# Patient Record
Sex: Female | Born: 1935
Health system: Southern US, Community
[De-identification: ages and names within clinical notes are randomized; demographics above are authoritative.]

## PROBLEM LIST (undated history)

## (undated) DIAGNOSIS — F32A Depression, unspecified: Secondary | ICD-10-CM

## (undated) DIAGNOSIS — E039 Hypothyroidism, unspecified: Secondary | ICD-10-CM

## (undated) DIAGNOSIS — G894 Chronic pain syndrome: Secondary | ICD-10-CM

## (undated) DIAGNOSIS — M1990 Unspecified osteoarthritis, unspecified site: Secondary | ICD-10-CM

## (undated) DIAGNOSIS — I5032 Chronic diastolic (congestive) heart failure: Secondary | ICD-10-CM

## (undated) DIAGNOSIS — C189 Malignant neoplasm of colon, unspecified: Secondary | ICD-10-CM

## (undated) DIAGNOSIS — R600 Localized edema: Secondary | ICD-10-CM

## (undated) DIAGNOSIS — I10 Essential (primary) hypertension: Secondary | ICD-10-CM

## (undated) DIAGNOSIS — R911 Solitary pulmonary nodule: Secondary | ICD-10-CM

## (undated) DIAGNOSIS — R0609 Other forms of dyspnea: Secondary | ICD-10-CM

## (undated) DIAGNOSIS — G4733 Obstructive sleep apnea (adult) (pediatric): Secondary | ICD-10-CM

## (undated) DIAGNOSIS — K589 Irritable bowel syndrome without diarrhea: Secondary | ICD-10-CM

## (undated) DIAGNOSIS — Z531 Procedure and treatment not carried out because of patient's decision for reasons of belief and group pressure: Secondary | ICD-10-CM

## (undated) DIAGNOSIS — I2510 Atherosclerotic heart disease of native coronary artery without angina pectoris: Secondary | ICD-10-CM

## (undated) DIAGNOSIS — F419 Anxiety disorder, unspecified: Secondary | ICD-10-CM

## (undated) DIAGNOSIS — R296 Repeated falls: Secondary | ICD-10-CM

## (undated) DIAGNOSIS — N19 Unspecified kidney failure: Secondary | ICD-10-CM

## (undated) DIAGNOSIS — I482 Chronic atrial fibrillation, unspecified: Secondary | ICD-10-CM

## (undated) DIAGNOSIS — Q820 Hereditary lymphedema: Secondary | ICD-10-CM

## (undated) DIAGNOSIS — I2781 Cor pulmonale (chronic): Secondary | ICD-10-CM

## (undated) DIAGNOSIS — F329 Major depressive disorder, single episode, unspecified: Secondary | ICD-10-CM

## (undated) DIAGNOSIS — N189 Chronic kidney disease, unspecified: Secondary | ICD-10-CM

## (undated) DIAGNOSIS — R29898 Other symptoms and signs involving the musculoskeletal system: Principal | ICD-10-CM

## (undated) DIAGNOSIS — R269 Unspecified abnormalities of gait and mobility: Principal | ICD-10-CM

## (undated) DIAGNOSIS — I272 Pulmonary hypertension, unspecified: Secondary | ICD-10-CM

## (undated) DIAGNOSIS — E785 Hyperlipidemia, unspecified: Secondary | ICD-10-CM

## (undated) DIAGNOSIS — A159 Respiratory tuberculosis unspecified: Secondary | ICD-10-CM

## (undated) DIAGNOSIS — J45909 Unspecified asthma, uncomplicated: Secondary | ICD-10-CM

## (undated) DIAGNOSIS — IMO0001 Reserved for inherently not codable concepts without codable children: Secondary | ICD-10-CM

## (undated) DIAGNOSIS — K5730 Diverticulosis of large intestine without perforation or abscess without bleeding: Secondary | ICD-10-CM

## (undated) DIAGNOSIS — D631 Anemia in chronic kidney disease: Secondary | ICD-10-CM

## (undated) DIAGNOSIS — R0600 Dyspnea, unspecified: Secondary | ICD-10-CM

## (undated) DIAGNOSIS — K56609 Unspecified intestinal obstruction, unspecified as to partial versus complete obstruction: Secondary | ICD-10-CM

## (undated) DIAGNOSIS — J449 Chronic obstructive pulmonary disease, unspecified: Secondary | ICD-10-CM

## (undated) DIAGNOSIS — G4700 Insomnia, unspecified: Secondary | ICD-10-CM

## (undated) HISTORY — PX: COLON BIOPSY: SHX1369

## (undated) HISTORY — DX: Unspecified asthma, uncomplicated: J45.909

## (undated) HISTORY — DX: Chronic pain syndrome: G89.4

## (undated) HISTORY — DX: Insomnia, unspecified: G47.00

## (undated) HISTORY — DX: Hypothyroidism, unspecified: E03.9

## (undated) HISTORY — DX: Atherosclerotic heart disease of native coronary artery without angina pectoris: I25.10

## (undated) HISTORY — DX: Other symptoms and signs involving the musculoskeletal system: R29.898

## (undated) HISTORY — DX: Repeated falls: R29.6

## (undated) HISTORY — DX: Chronic kidney disease, unspecified: N18.9

## (undated) HISTORY — DX: Localized edema: R60.0

## (undated) HISTORY — DX: Pulmonary hypertension, unspecified: I27.20

## (undated) HISTORY — DX: Diverticulosis of large intestine without perforation or abscess without bleeding: K57.30

## (undated) HISTORY — DX: Unspecified intestinal obstruction, unspecified as to partial versus complete obstruction: K56.609

## (undated) HISTORY — DX: Hyperlipidemia, unspecified: E78.5

## (undated) HISTORY — DX: Chronic diastolic (congestive) heart failure: I50.32

## (undated) HISTORY — DX: Solitary pulmonary nodule: R91.1

## (undated) HISTORY — DX: Cor pulmonale (chronic): I27.81

## (undated) HISTORY — DX: Chronic atrial fibrillation, unspecified: I48.20

## (undated) HISTORY — DX: Irritable bowel syndrome, unspecified: K58.9

## (undated) HISTORY — DX: Obstructive sleep apnea (adult) (pediatric): G47.33

## (undated) HISTORY — DX: Depression, unspecified: F32.A

## (undated) HISTORY — PX: COLON RESECTION: SHX5231

## (undated) HISTORY — DX: Major depressive disorder, single episode, unspecified: F32.9

## (undated) HISTORY — DX: Chronic obstructive pulmonary disease, unspecified: J44.9

## (undated) HISTORY — DX: Malignant neoplasm of colon, unspecified: C18.9

## (undated) HISTORY — DX: Dyspnea, unspecified: R06.00

## (undated) HISTORY — DX: Respiratory tuberculosis unspecified: A15.9

## (undated) HISTORY — DX: Other forms of dyspnea: R06.09

## (undated) HISTORY — DX: Anemia in chronic kidney disease: D63.1

## (undated) HISTORY — DX: Hereditary lymphedema: Q82.0

## (undated) HISTORY — DX: Unspecified osteoarthritis, unspecified site: M19.90

## (undated) HISTORY — DX: Unspecified abnormalities of gait and mobility: R26.9

---
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
## 1981-02-16 HISTORY — PX: ABDOMINAL HYSTERECTOMY: SHX81

## 1982-02-16 HISTORY — PX: CHOLECYSTECTOMY: SHX55

## 1986-07-20 ENCOUNTER — Encounter: Payer: Self-pay | Admitting: Internal Medicine

## 1988-10-14 ENCOUNTER — Encounter (INDEPENDENT_AMBULATORY_CARE_PROVIDER_SITE_OTHER): Payer: Self-pay | Admitting: *Deleted

## 1990-10-11 ENCOUNTER — Encounter: Payer: Self-pay | Admitting: Internal Medicine

## 1993-08-07 ENCOUNTER — Encounter: Payer: Self-pay | Admitting: Internal Medicine

## 1994-04-02 ENCOUNTER — Encounter: Payer: Self-pay | Admitting: Internal Medicine

## 1997-07-02 ENCOUNTER — Emergency Department (HOSPITAL_COMMUNITY): Admission: EM | Admit: 1997-07-02 | Discharge: 1997-07-02 | Payer: Self-pay | Admitting: Emergency Medicine

## 1997-07-09 ENCOUNTER — Inpatient Hospital Stay (HOSPITAL_COMMUNITY): Admission: AD | Admit: 1997-07-09 | Discharge: 1997-07-13 | Payer: Self-pay | Admitting: Pediatrics

## 1997-07-31 ENCOUNTER — Inpatient Hospital Stay (HOSPITAL_COMMUNITY): Admission: EM | Admit: 1997-07-31 | Discharge: 1997-08-03 | Payer: Self-pay | Admitting: Emergency Medicine

## 1997-08-08 ENCOUNTER — Ambulatory Visit (HOSPITAL_COMMUNITY): Admission: RE | Admit: 1997-08-08 | Discharge: 1997-08-08 | Payer: Self-pay | Admitting: Cardiology

## 1997-08-16 ENCOUNTER — Encounter (HOSPITAL_COMMUNITY): Admission: RE | Admit: 1997-08-16 | Discharge: 1997-11-14 | Payer: Self-pay | Admitting: Cardiology

## 1997-09-11 ENCOUNTER — Encounter (HOSPITAL_COMMUNITY): Admission: RE | Admit: 1997-09-11 | Discharge: 1997-12-10 | Payer: Self-pay | Admitting: Cardiology

## 1998-05-12 ENCOUNTER — Emergency Department (HOSPITAL_COMMUNITY): Admission: EM | Admit: 1998-05-12 | Discharge: 1998-05-12 | Payer: Self-pay | Admitting: Emergency Medicine

## 1998-05-12 ENCOUNTER — Encounter: Payer: Self-pay | Admitting: Emergency Medicine

## 1998-07-19 ENCOUNTER — Ambulatory Visit (HOSPITAL_COMMUNITY): Admission: RE | Admit: 1998-07-19 | Discharge: 1998-07-19 | Payer: Self-pay | Admitting: Cardiology

## 2000-02-05 ENCOUNTER — Encounter: Payer: Self-pay | Admitting: Internal Medicine

## 2000-02-17 DIAGNOSIS — E039 Hypothyroidism, unspecified: Secondary | ICD-10-CM

## 2000-02-17 HISTORY — DX: Hypothyroidism, unspecified: E03.9

## 2001-06-16 ENCOUNTER — Other Ambulatory Visit: Admission: RE | Admit: 2001-06-16 | Discharge: 2001-06-16 | Payer: Self-pay | Admitting: Family Medicine

## 2001-07-15 ENCOUNTER — Ambulatory Visit (HOSPITAL_COMMUNITY): Admission: RE | Admit: 2001-07-15 | Discharge: 2001-07-15 | Payer: Self-pay | Admitting: Cardiology

## 2003-02-06 ENCOUNTER — Encounter: Admission: RE | Admit: 2003-02-06 | Discharge: 2003-02-06 | Payer: Self-pay | Admitting: Family Medicine

## 2003-02-12 ENCOUNTER — Inpatient Hospital Stay (HOSPITAL_COMMUNITY): Admission: AD | Admit: 2003-02-12 | Discharge: 2003-02-17 | Payer: Self-pay | Admitting: Cardiology

## 2003-02-12 HISTORY — PX: CARDIAC CATHETERIZATION: SHX172

## 2003-02-14 ENCOUNTER — Encounter (INDEPENDENT_AMBULATORY_CARE_PROVIDER_SITE_OTHER): Payer: Self-pay | Admitting: Specialist

## 2003-03-14 ENCOUNTER — Ambulatory Visit (HOSPITAL_COMMUNITY): Admission: RE | Admit: 2003-03-14 | Discharge: 2003-03-14 | Payer: Self-pay | Admitting: Pulmonary Disease

## 2003-04-16 ENCOUNTER — Ambulatory Visit (HOSPITAL_COMMUNITY): Admission: RE | Admit: 2003-04-16 | Discharge: 2003-04-16 | Payer: Self-pay | Admitting: Pulmonary Disease

## 2003-05-29 ENCOUNTER — Ambulatory Visit (HOSPITAL_COMMUNITY): Admission: RE | Admit: 2003-05-29 | Discharge: 2003-05-29 | Payer: Self-pay | Admitting: Pulmonary Disease

## 2003-06-11 ENCOUNTER — Other Ambulatory Visit: Admission: RE | Admit: 2003-06-11 | Discharge: 2003-06-11 | Payer: Self-pay | Admitting: Family Medicine

## 2004-01-21 ENCOUNTER — Ambulatory Visit: Payer: Self-pay | Admitting: Internal Medicine

## 2004-01-24 ENCOUNTER — Encounter: Admission: RE | Admit: 2004-01-24 | Discharge: 2004-01-24 | Payer: Self-pay | Admitting: Internal Medicine

## 2004-08-14 ENCOUNTER — Other Ambulatory Visit: Admission: RE | Admit: 2004-08-14 | Discharge: 2004-08-14 | Payer: Self-pay | Admitting: Family Medicine

## 2004-08-22 ENCOUNTER — Encounter: Admission: RE | Admit: 2004-08-22 | Discharge: 2004-08-22 | Payer: Self-pay | Admitting: Family Medicine

## 2004-11-21 ENCOUNTER — Encounter: Admission: RE | Admit: 2004-11-21 | Discharge: 2004-11-21 | Payer: Self-pay | Admitting: Family Medicine

## 2004-11-25 ENCOUNTER — Encounter: Admission: RE | Admit: 2004-11-25 | Discharge: 2004-11-25 | Payer: Self-pay | Admitting: Family Medicine

## 2004-12-04 ENCOUNTER — Ambulatory Visit: Payer: Self-pay | Admitting: Internal Medicine

## 2006-01-04 ENCOUNTER — Encounter: Admission: RE | Admit: 2006-01-04 | Discharge: 2006-01-22 | Payer: Self-pay | Admitting: Family Medicine

## 2006-01-08 ENCOUNTER — Encounter: Admission: RE | Admit: 2006-01-08 | Discharge: 2006-01-08 | Payer: Self-pay | Admitting: Family Medicine

## 2006-02-02 ENCOUNTER — Encounter: Admission: RE | Admit: 2006-02-02 | Discharge: 2006-02-02 | Payer: Self-pay | Admitting: Family Medicine

## 2006-02-02 ENCOUNTER — Encounter: Payer: Self-pay | Admitting: Internal Medicine

## 2006-07-15 ENCOUNTER — Encounter: Admission: RE | Admit: 2006-07-15 | Discharge: 2006-07-15 | Payer: Self-pay | Admitting: Family Medicine

## 2008-03-12 ENCOUNTER — Encounter (INDEPENDENT_AMBULATORY_CARE_PROVIDER_SITE_OTHER): Payer: Self-pay | Admitting: *Deleted

## 2008-03-12 ENCOUNTER — Ambulatory Visit: Payer: Self-pay | Admitting: Vascular Surgery

## 2008-03-12 ENCOUNTER — Inpatient Hospital Stay (HOSPITAL_COMMUNITY): Admission: EM | Admit: 2008-03-12 | Discharge: 2008-03-13 | Payer: Self-pay | Admitting: Emergency Medicine

## 2008-03-28 ENCOUNTER — Encounter: Admission: RE | Admit: 2008-03-28 | Discharge: 2008-03-28 | Payer: Self-pay | Admitting: Internal Medicine

## 2009-07-16 ENCOUNTER — Telehealth: Payer: Self-pay | Admitting: Internal Medicine

## 2009-07-17 DIAGNOSIS — J984 Other disorders of lung: Secondary | ICD-10-CM

## 2009-07-17 DIAGNOSIS — K5730 Diverticulosis of large intestine without perforation or abscess without bleeding: Secondary | ICD-10-CM | POA: Insufficient documentation

## 2009-07-17 DIAGNOSIS — Q820 Hereditary lymphedema: Secondary | ICD-10-CM | POA: Insufficient documentation

## 2009-07-17 DIAGNOSIS — E785 Hyperlipidemia, unspecified: Secondary | ICD-10-CM

## 2009-07-17 DIAGNOSIS — I10 Essential (primary) hypertension: Secondary | ICD-10-CM | POA: Insufficient documentation

## 2009-07-17 DIAGNOSIS — C187 Malignant neoplasm of sigmoid colon: Secondary | ICD-10-CM

## 2009-07-17 DIAGNOSIS — J449 Chronic obstructive pulmonary disease, unspecified: Secondary | ICD-10-CM | POA: Insufficient documentation

## 2009-07-17 DIAGNOSIS — I2510 Atherosclerotic heart disease of native coronary artery without angina pectoris: Secondary | ICD-10-CM

## 2009-07-17 DIAGNOSIS — K5900 Constipation, unspecified: Secondary | ICD-10-CM

## 2009-07-17 DIAGNOSIS — F32A Depression, unspecified: Secondary | ICD-10-CM | POA: Insufficient documentation

## 2009-07-17 DIAGNOSIS — I252 Old myocardial infarction: Secondary | ICD-10-CM

## 2009-07-17 DIAGNOSIS — Z8719 Personal history of other diseases of the digestive system: Secondary | ICD-10-CM

## 2009-07-17 DIAGNOSIS — F329 Major depressive disorder, single episode, unspecified: Secondary | ICD-10-CM

## 2009-07-18 ENCOUNTER — Ambulatory Visit: Payer: Self-pay | Admitting: Internal Medicine

## 2009-10-23 ENCOUNTER — Encounter (INDEPENDENT_AMBULATORY_CARE_PROVIDER_SITE_OTHER): Payer: Self-pay | Admitting: *Deleted

## 2009-12-11 ENCOUNTER — Telehealth: Payer: Self-pay | Admitting: Internal Medicine

## 2010-01-17 ENCOUNTER — Encounter: Payer: Self-pay | Admitting: Internal Medicine

## 2010-02-24 ENCOUNTER — Ambulatory Visit
Admission: RE | Admit: 2010-02-24 | Discharge: 2010-02-24 | Payer: Self-pay | Source: Home / Self Care | Attending: Internal Medicine | Admitting: Internal Medicine

## 2010-02-24 ENCOUNTER — Encounter: Payer: Self-pay | Admitting: Internal Medicine

## 2010-03-09 ENCOUNTER — Encounter: Payer: Self-pay | Admitting: Pulmonary Disease

## 2010-03-18 NOTE — Progress Notes (Signed)
Summary: Schedule Direct Colon  ---- Converted from flag ----  ---- 12/11/2009 11:00 AM, Karen Kitchens Nelson-Smith CMA (AAMA) wrote: This patient is on for 12/17/09 for office visit to discuss having a colonoscopy due to her coumadin usage. However, looks like you have already said patient should d/c coumadin prior to colonoscopy in 2011.Marland KitchenMarland KitchenMarland KitchenDo you want me to just schedule her for direct colon off coumadin????   ---- 12/11/2009 1:00 PM, Hart Carwin MD wrote: OK for direct colon OFF Coumadin thanx ------------------------------

## 2010-03-18 NOTE — Procedures (Signed)
Summary: [facility]   Imported By: Sherian Rein 07/17/2009 15:17:04  _____________________________________________________________________  External Attachment:    Type:   Image     Comment:   External Document

## 2010-03-18 NOTE — Assessment & Plan Note (Signed)
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  She was due for colonoscopy this December 2006.  She is doing quite well with her bowel habits and has not been taking any laxatives.  She is currently on Coumadin 2 mg a day for chronic atrial fibrillation.  She denies any rectal bleeding.  MEDICATIONS:  Lipitor 40 mg p.o. q.d., Toprol-XL 150 mg p.o. q.d., Coumadin 2 mg p.o. q.d., Synthroid 1 mg q.d., promethazine, morphine sulfate t.i.d., and GlycoLax and Zelnorm on p.r.n. basis.  PHYSICAL EXAMINATION:  Blood pressure 128/72; pulse 70; weight 234 pounds.  She was overweight, alert, oriented, in no distress.  Lungs were clear to auscultation.  COR with normal S1; normal S2.  Abdomen was soft, obese, mildly tender diffusely in mostly the left lower quadrant and left middle quadrant.  A well-healed surgical scar was seen above and below the umbilicus.  Rectal exam shows large amount of soft, Hemoccult-negative stool.  Extremities:  Edema bilaterally, trace to 1+.   IMPRESSION:  A 76 year old white female with history of invasive adenocarcinoma of the sigmoid colon in a polyp which was entirely removed with subsequent segmental resection of the sigmoid colon in 1984.  She since then has had numerous colonoscopies, last one in December 2001 and has done quite well in not having any polyps.  I think it would be quite reasonable for her to wait another year or two for another colonoscopy.  There is no evidence of upper gastrointestinal blood loss.  Her chances of developing colon cancer this time are quite low, and the risk of taking  her off Coumadin is higher in view of her chronic atrial fibrillation.  PLAN: 1.  Re-call colonoscopy in 1 year.  We will discuss then a repeat colonoscopy. 2.  Continue on MiraLax on p.r.n. basis. 3.  Follow up with Dr. Smith Mince.    Hedwig Morton. Juanda Chance, M.D.  [id number] cc:  Talmadge Coventry, MD D:  12/04/04; T:  ; Job [id number]

## 2010-03-18 NOTE — Procedures (Signed)
Summary: Colonoscopy/[place]  Colonoscopy/[place]   Imported By: Sherian Rein 07/17/2009 15:19:31  _____________________________________________________________________  External Attachment:    Type:   Image     Comment:   External Document

## 2010-03-18 NOTE — Letter (Signed)
Summary: Colonoscopy Letter  [facility]  [address] [place], [place] [address]   Phone: [phone]  Fax: [phone]      October 23, 2009 MRN: [id number]   GABRYELLA MURFIN [address] [place], [place]  [address]   Dear Ms. Wogan,   According to your medical record, it is time for you to schedule a Colonoscopy. The American Cancer Society recommends this procedure as a method to detect early colon cancer. Patients with a family history of colon cancer, or a personal history of colon polyps or inflammatory bowel disease are at increased risk.  This letter has beeen generated based on the recommendations made at the time of your procedure. If you feel that in your particular situation this may no longer apply, please contact our office.  Please call our office at [phone] to schedule this appointment or to update your records at your earliest convenience.  Thank you for cooperating with [place] to provide you with the very best care possible.   Sincerely,  Hedwig Morton. Juanda Chance, M.D.  [facility]astroenterology [facility] [phone]

## 2010-03-18 NOTE — Progress Notes (Signed)
Summary: Triage  Phone Note Call from Patient Call back at Home Phone [phone]   Caller: Patient Call For: Dhriti Fales Reason for Call: Talk to Nurse Summary of Call: Patient is scheduled for first available appt 8-8 but wants to know if it's ok with Dr. Juanda Chance for her to wait that long. Patient states that she is having a lot of problems having a bm despite laxatives and has rectal pain, she's very concern because she had colon cancer in the past. Initial call taken by: Tawni Levy,  Jul 16, 2009 4:30 PM  Follow-up for Phone Call        Pt. will see Dr.Quint Chestnut on 07-18-09 at 9:45am. Follow-up by: Laureen Ochs LPN,  July 18, 4694 9:21 AM

## 2010-03-18 NOTE — Assessment & Plan Note (Signed)
Summary: trouble with bms, rectal pain...em   History of Present Illness Visit Type: Initial Visit Primary GI MD: Lina Sar MD Primary Provider: Ace Gins MD Chief Complaint: rectal pain, constipation History of Present Illness:   Ms. 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 She had a small-bowel obstruction in 1985 and laparoscopic surgery for small-bowel obstruction in 2001. She was due for a colonoscopy in December 2006 but never scheduled one. Patient comes today complaining of constipation as well as rectal pain and bleeding. She also complains of lower abdominal pain and bloating. She has l started  on MiraLax 17 g daily with good results. She has not been very active physically due  to swelling of her feet. and blames her weight gain on that. She has been on Coumadin for chronic atrial fibtillation.   GI Review of Systems    Reports abdominal pain and  bloating.     Location of  Abdominal pain: lower abdomen.    Denies acid reflux, belching, chest pain, dysphagia with liquids, dysphagia with solids, heartburn, loss of appetite, nausea, vomiting, vomiting blood, weight loss, and  weight gain.      Reports constipation, rectal bleeding, and  rectal pain.     Denies anal fissure, black tarry stools, change in bowel habit, diarrhea, diverticulosis, fecal incontinence, heme positive stool, hemorrhoids, irritable bowel syndrome, jaundice, light color stool, and  liver problems.    Current Medications (verified): 1)  Torsemide 20 Mg Tabs (Torsemide) .... As Needed 2)  Klor-Con 10 10 Meq Cr-Tabs (Potassium Chloride) .... As Needed 3)  Toprol Xl 50 Mg Xr24h-Tab (Metoprolol Succinate) .[name].. 1 By Mouth Once Daily 4)  Coumadin 2 Mg Tabs (Warfarin Sodium) .... Varies (As Directed) 5)  Aspir-Low 81 Mg Tbec (Aspirin) .[name].. 1 By Mouth Once Daily 6)   Lipitor 40 Mg Tabs (Atorvastatin Calcium) .[name].. 1 Poqd 7)  Nitrolingual 0.4 Mg/spray Soln (Nitroglycerin) .... As Needed 8)  Synthroid 112 Mcg Tabs (Levothyroxine Sodium) .[name].. 1 By Mouth Once Daily 9)  Sertraline Hcl 50 Mg Tabs (Sertraline Hcl) .[name].. 1 By Mouth Once Daily 10)  Promethazine Hcl 25 Mg Tabs (Promethazine Hcl) .[name].. 1 By Mouth Once Daily 11)  Morphine Sulfate Cr 60 Mg Xr12h-Tab (Morphine Sulfate) .[name].. 1 By Mouth Three Times A Day 12)  Lisinopril 20 Mg Tabs (Lisinopril) .[name].. 1 By Mouth Once Daily 13)  Ambien Cr 6.25 Mg Cr-Tabs (Zolpidem Tartrate) .[name].. 1 By Mouth At Bedtime 14)  Xopenex 0.63 Mg/44ml Nebu (Levalbuterol Hcl) .... As Needed 15)  Nasonex 50 Mcg/act Susp (Mometasone Furoate) .... As Needed 16)  Brovana 15 Mcg/61ml Nebu (Arformoterol Tartrate) .... As Needed  Allergies (verified): 1)  ! Pcn 2)  ! Codeine 3)  ! Sulfa 4)  ! * Latex  Past History:  Past Medical History: Reviewed history from 07/17/2009 and no changes required. Current Problems:  CONSTIPATION (ICD-564.00) COPD (ICD-496) HYPERTENSION (ICD-401.9) HYPERLIPIDEMIA (ICD-272.4) ATRIAL FIBRILLATION, CHRONIC (ICD-427.31) DIVERTICULOSIS, COLON (ICD-562.10) GASTRITIS, HX OF (ICD-V12.79) DEPRESSION (ICD-311) CORONARY ARTERY DISEASE (ICD-414.00) LUNG NODULE (ICD-518.89) MILROY'S DISEASE (ICD-757.0) MYOCARDIAL INFARCTION, HX OF (ICD-412) SMALL BOWEL OBSTRUCTION, HX OF (ICD-V12.79) Hx of CARCINOMA, SIGMOID COLON (ICD-153.3)    Past Surgical History: Reviewed history from 07/17/2009 and no changes required. left hemicolectomy-secondary to sigmoid adenocarinoma in 1984 Anastomotic stircure repair-1984 Cholecystectomy Appendectomy Total abdominal hysterectomy and unilateral (left) oophorectomy Cardiac Angioplasty  Family History: Reviewed history  from 07/17/2009 and no changes required. Family History of Heart Disease: Father, Brothers Family History of Diabetes: Brothers x 2, Uncle Family History of  Prostate Cancer: Maternal Uncles x 3  Social History: Reviewed history from 07/17/2009 and no changes required. Patient has never smoked.  Alcohol Use - yes-rare Illicit Drug Use - no  Review of Systems       The patient complains of arthritis/joint pain, change in vision, heart rhythm changes, nosebleeds, shortness of breath, sleeping problems, swelling of feet/legs, and urine leakage.  The patient denies allergy/sinus, anemia, anxiety-new, back pain, blood in urine, breast changes/lumps, confusion, cough, coughing up blood, depression-new, fainting, fatigue, fever, headaches-new, hearing problems, heart murmur, itching, menstrual pain, muscle pains/cramps, night sweats, pregnancy symptoms, skin rash, sore throat, swollen lymph glands, thirst - excessive , urination - excessive , urination changes/pain, vision changes, and voice change.         Pertinent positive and negative review of systems were noted in the above HPI. All other ROS was otherwise negative.   Vital Signs:  Patient profile:   76 year old female Height:      65 inches Weight:      220 pounds BMI:     36.74 Pulse rate:   72 / minute Pulse rhythm:   regular BP sitting:   112 / 60  (left arm)  Vitals Entered By: Chales Abrahams CMA Duncan Dull) (July 18, 2009 9:58 AM)  Physical Exam  General:  alert, oriented and overweight. Eyes:  PERRLA, no icterus. Mouth:  No deformity or lesions, dentition normal. Neck:  Supple; no masses or thyromegaly. Lungs:  Clear throughout to auscultation. Heart:  irregular S1-S2, no murmur. Abdomen:  obese soft abdomen with well healed surgical scar. Normal active bowel sounds. Mild tenderness in left lower and left middle quadrants. Rectal:  soft Hemoccult negative stool. Small external skin tags. Extremities:  bilateral pedal edema extending to her knees. Skin:  skin and nails no rash. Psych:  Alert and cooperative. Normal mood and affect.   Impression & Recommendations:  Problem # 1:   CONSTIPATION (ICD-564.00) Patient has functional constipation. She is status post remote sigmoid resection following a cancerous sigmoid polyp. Her last colonoscopy was in 2001. Her constipation has progressed due to inactivity. She has responded to MiraLax. She needs to continue on MiraLax 17 g daily and use an additional 9 g at night as needed. She is due for a repeat colonoscopy. Anastomotic stricture would be an unlikely  diagnosis.  Problem # 2:  ATRIAL FIBRILLATION, CHRONIC (ICD-427.31) Patient is on chronic Coumadin. We will discontinue Coumadin prior to her colonoscopy. Patient will check with her cardiologist. We will send her a recall letter in October 2011.  Patient Instructions: 1)  Recall colonoscopy, October 2011. 2)  Analpram 1% two times a day as needed. 3)  Miralax 1 capful daily. She may increase to 1 1/2 capfuls daily. 4)  Copy sent to : Ace Gins, MD, [facility] 5)  (Dr Aram Candela) 6)  The medication list was reviewed and reconciled.  All changed / newly prescribed medications were explained.  A complete medication list was provided to the patient / caregiver. Prescriptions: MIRALAX  POWD (POLYETHYLENE GLYCOL 3350) Dissolve 1 1/2 capfuls in at least 8 ounces water/juice and drink once daily.  #527 grams x 11   Entered by:   Lamona Curl CMA (AAMA)   Authorized by:   Hart Carwin MD   Signed by:   Lamona Curl CMA (  AAMA) on 07/18/2009   Method used:   Electronically to        [facility]  [address] [id number] (retail)       [address]       [place], [place]  [address]       Ph: [phone] or [phone]       Fax: [phone]   RxID:   [id number] ANALPRAM-HC 1-1 % CREA (HYDROCORTISONE ACE-PRAMOXINE) Apply to rectum two times a day as needed  #30 grams x 0   Entered by:   Lamona Curl CMA (AAMA)   Authorized by:   Hart Carwin MD   Signed by:   Lamona Curl CMA (AAMA) on 07/18/2009   Method used:    [facility] to        [facility]  [address] [id number]       [address]       [place], [place]  [address]       Ph: [phone] or [phone]       Fax: [phone]   RxID:   [id number]

## 2010-03-18 NOTE — Procedures (Signed)
Summary: Upper Endoscopy/[place]  Upper Endoscopy/[place]   Imported By: Sherian Rein 07/17/2009 15:18:26  _____________________________________________________________________  External Attachment:    Type:   Image     Comment:   External Document

## 2010-03-18 NOTE — Procedures (Signed)
Summary: Colonoscopy/[facility]  Colonoscopy/[facility]   Imported By: Sherian Rein 07/17/2009 15:16:00  _____________________________________________________________________  External Attachment:    Type:   Image     Comment:   External Document

## 2010-03-18 NOTE — Procedures (Signed)
Summary: Colonoscopy/[place]  Colonoscopy/[place]   Imported By: Sherian Rein 07/17/2009 15:14:07  _____________________________________________________________________  External Attachment:    Type:   Image     Comment:   External Document

## 2010-03-18 NOTE — Procedures (Signed)
Summary: Colonoscopy/[place]  Colonoscopy/[place]   Imported By: Sherian Rein 07/17/2009 15:21:36  _____________________________________________________________________  External Attachment:    Type:   Image     Comment:   External Document

## 2010-03-20 NOTE — Letter (Signed)
Summary: [facility]   Imported By: Sherian Rein 02/11/2010 09:59:46  _____________________________________________________________________  External Attachment:    Type:   Image     Comment:   External Document

## 2010-03-20 NOTE — Letter (Signed)
Summary: [facility] Instructions  [facility]  [address] [place], [place] [address]   Phone: [phone]  Fax: [phone]       Victoria Fisher    1934/04/28    MRN: [id number]        Procedure Day /Date: Friday 04/18/10     Arrival Time: 12:30 pm     Procedure Time: 1:30 pm     Location of Procedure:                    _ x_  [facility] (4th Floor)  PREPARATION FOR COLONOSCOPY WITH MOVIPREP   Starting 5 days prior to your procedure 04/14/10 do not eat nuts, seeds, popcorn, corn, beans, peas,  salads, or any raw vegetables.  Do not take any fiber supplements (e.g. Metamucil, Citrucel, and Benefiber).  THE DAY BEFORE YOUR PROCEDURE         DATE: 04/17/10  DAY: Thursday  1.  Drink clear liquids the entire day-NO SOLID FOOD  2.  Do not drink anything colored red or purple.  Avoid juices with pulp.  No orange juice.  3.  Drink at least 64 oz. (8 glasses) of fluid/clear liquids during the day to prevent dehydration and help the prep work efficiently.  CLEAR LIQUIDS INCLUDE: Water Jello Ice Popsicles Tea (sugar ok, no milk/cream) Powdered fruit flavored drinks Coffee (sugar ok, no milk/cream) Gatorade Juice: apple, white grape, white cranberry  Lemonade Clear bullion, consomm, broth Carbonated beverages (any kind) Strained chicken noodle soup Hard Candy                             4.  In the morning, mix first dose of MoviPrep solution:    Empty 1 Pouch A and 1 Pouch B into the disposable container    Add lukewarm drinking water to the top line of the container. Mix to dissolve    Refrigerate (mixed solution should be used within 24 hrs)  5.  Begin drinking the prep at 5:00 p.m. The MoviPrep container is divided by 4 marks.   Every 15 minutes drink the solution down to the next mark (approximately 8 oz) until the full liter is complete.   6.  Follow completed prep with 16 oz of clear liquid of your choice (Nothing red or purple).   Continue to drink clear liquids until bedtime.  7.  Before going to bed, mix second dose of MoviPrep solution:    Empty 1 Pouch A and 1 Pouch B into the disposable container    Add lukewarm drinking water to the top line of the container. Mix to dissolve    Refrigerate  THE DAY OF YOUR PROCEDURE      DATE: 04/18/10 DAY: Friday  Beginning at 8:30 a.m. (5 hours before procedure):         1. Every 15 minutes, drink the solution down to the next mark (approx 8 oz) until the full liter is complete.  2. Follow completed prep with 16 oz. of clear liquid of your choice.    3. You may drink clear liquids until 9:30 am (4HOURS BEFORE PROCEDURE).   MEDICATION INSTRUCTIONS  Unless otherwise instructed, you should take regular prescription medications with a small sip of water   as early as possible the morning of your procedure.  Stop taking Coumadin 3 days prior to your procedure per Dr Lina Sar. You should hold your coumadin on Tuesday  04/15/10, Wednesday 04/16/10 and Thursday 04/17/10.          OTHER INSTRUCTIONS  You will need a responsible adult at least 74 years of age to accompany you and drive you home.   This person must remain in the waiting room during your procedure.  Wear loose fitting clothing that is easily removed.  Leave jewelry and other valuables at home.  However, you may wish to bring a book to read or  an iPod/MP3 player to listen to music as you wait for your procedure to start.  Remove all body piercing jewelry and leave at home.  Total time from sign-in until discharge is approximately 2-3 hours.  You should go home directly after your procedure and rest.  You can resume normal activities the  day after your procedure.  The day of your procedure you should not:   Drive   Make legal decisions   Operate machinery   Drink alcohol   Return to work  You will receive specific instructions about eating, activities and medications before you  leave.    The above instructions have been reviewed and explained to me by   _______________________    I fully understand and can verbalize these instructions _____________________________ Date _________

## 2010-03-20 NOTE — Assessment & Plan Note (Signed)
Summary: wants to sch'd OV 1st before COL--ch.   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 She is status post sigmoid resection. She is on Coumadin for chronic atrial fibrillation. She has Miilroy's disease causing lymphatic obstructions. She is due for a repeat colonoscopy. There is a history of a small bowel obstruction in 2001. Patient has some anxiety about the Coumadin discontinuation althgough it  was okayed by a cardiologist. She is also concerned about the prep and now about sedation. On her last exam, she required high doses of Versed and fentanyl, so she is likely to need propofol sedation.   GI Review of Systems    Reports abdominal pain.     Location of  Abdominal pain: lower abdomen.    Denies acid reflux, belching, bloating, chest pain, dysphagia with liquids, dysphagia with solids, heartburn, loss of appetite, nausea, vomiting, vomiting blood, weight loss, and  weight gain.      Reports constipation.     Denies anal fissure, black tarry stools, change in bowel habit, diarrhea, diverticulosis, fecal incontinence, heme positive stool, hemorrhoids, irritable bowel syndrome, jaundice, light color stool, liver problems, rectal bleeding, and  rectal pain.    Current Medications (verified): 1)  Torsemide 20 Mg Tabs (Torsemide) .... As Needed 2)  Klor-Con 10 10 Meq Cr-Tabs (Potassium Chloride) .... As Needed 3)  Toprol Xl 50 Mg Xr24h-Tab (Metoprolol Succinate) .[name].. 1 By Mouth Once Daily 4)  Coumadin 2 Mg Tabs (Warfarin Sodium) .... Varies (As Directed) 5)  Aspir-Low 81 Mg Tbec (Aspirin) .[name].. 1 By Mouth Once Daily 6)  Lipitor 40 Mg Tabs (Atorvastatin Calcium) .[name].. 1 Poqd 7)  Nitrolingual 0.4 Mg/spray Soln (Nitroglycerin) .... As  Needed 8)  Synthroid 112 Mcg Tabs (Levothyroxine Sodium) .[name].. 1 By Mouth Once Daily 9)  Sertraline Hcl 50 Mg Tabs (Sertraline Hcl) .[name].. 1 By Mouth Once Daily 10)  Promethazine Hcl 25 Mg Tabs (Promethazine Hcl) .[name].. 1 By Mouth Once Daily 11)  Morphine Sulfate Cr 60 Mg Xr12h-Tab (Morphine Sulfate) .[name].. 1 By Mouth Three Times A Day 12)  Lisinopril 20 Mg Tabs (Lisinopril) .[name].. 1 By Mouth Once Daily 13)  Ambien Cr 6.25 Mg Cr-Tabs (Zolpidem Tartrate) .[name].. 1 By Mouth At Bedtime 14)  Xopenex 0.63 Mg/82ml Nebu (Levalbuterol Hcl) .... As Needed 15)  Nasonex 50 Mcg/act Susp (Mometasone Furoate) .... As Needed 16)  Brovana 15 Mcg/61ml Nebu (Arformoterol Tartrate) .... As Needed 17)  Analpram-Hc 1-1 % Crea (Hydrocortisone Ace-Pramoxine) .... Apply To Rectum Two Times A Day As Needed 18)  Miralax  Powd (Polyethylene Glycol 3350) .... Dissolve 1 1/2 Capfuls in At Least 8 Ounces Water/juice and Drink Once Daily. 19)  Morphine Sulfate 30 Mg Tabs (Morphine Sulfate) .... Take 1 Tablet Three Times A Day For Breakthru Pain 20)  Triamcinolone Acetonide 0.025 % Crea (Triamcinolone Acetonide) .... As Needed  Allergies: 1)  ! Pcn 2)  ! Codeine 3)  ! Sulfa 4)  ! * Antibiotics 5)  ! * Latex  Past History:  Past Medical History: Reviewed history from 07/17/2009 and no changes required. Current Problems:  CONSTIPATION (ICD-564.00) COPD (ICD-496) HYPERTENSION (ICD-401.9) HYPERLIPIDEMIA (ICD-272.4) ATRIAL FIBRILLATION, CHRONIC (ICD-427.31) DIVERTICULOSIS, COLON (ICD-562.10) GASTRITIS, HX OF (ICD-V12.79) DEPRESSION (ICD-311) CORONARY ARTERY DISEASE (ICD-414.00) LUNG NODULE (ICD-518.89) MILROY'S DISEASE (ICD-757.0)  MYOCARDIAL INFARCTION, HX OF (ICD-412) SMALL BOWEL OBSTRUCTION, HX OF (ICD-V12.79) Hx of CARCINOMA, SIGMOID COLON (ICD-153.3)    Past Surgical History: Reviewed history from 07/17/2009 and no changes required. left hemicolectomy-secondary to sigmoid adenocarinoma in 1984 Anastomotic stircure  repair-1984 Cholecystectomy Appendectomy Total abdominal hysterectomy and unilateral (left) oophorectomy Cardiac Angioplasty  Family History: Reviewed history from 07/17/2009 and no changes required. Family History of Heart Disease: Father, Brothers Family History of Diabetes: Brothers x 2, Uncle Family History of Prostate Cancer: Maternal Uncles x 3  Social History: Reviewed history from 07/17/2009 and no changes required. Patient has never smoked.  Alcohol Use - yes-rare Illicit Drug Use - no  Vital Signs:  Patient profile:   76 year old female Height:      65 inches Weight:      233.25 pounds BMI:     38.96 Pulse rate:   64 / minute Pulse rhythm:   irregular BP sitting:   120 / 64  (left arm) Cuff size:   large  Vitals Entered By: June McMurray CMA Duncan Dull) (February 24, 2010 10:34 AM)   Impression & Recommendations:  Problem # 1:  Hx of CARCINOMA, SIGMOID COLON (ICD-153.3)  Patient is due for a repeat colonoscopy. We rescheduled the procedure to be completed with propofol based on our last experience with conscious sedation. She is to discontinue her Coumadin 3 days prior to the procedure and will have her prothrombin time rechecked the morning of the procedure. Her prep will be Moviprep since it can be separated into 2 parts to minimize the volume she would have to drink at one time.  Orders: Colonoscopy (Colon)  Problem # 2:  ATRIAL FIBRILLATION, CHRONIC (ICD-427.31) Patient is on Coumadin. This would be discontinued 3 days prior to her colonoscopy.  Patient Instructions: 1)  You have been scheduled for a colonoscopy with Propofol. Please follow written prep instructions that were given to you today at your visit.  2)  Please pick up your prescriptions at the pharmacy. Electronic prescription(s) has already been sent for Moviprep. 3)  We will need to check your prothrombin time on the morning of the procedure. You should arrive around 9:30 am for this test. 4)   Discontinue Coumadin for 3 days prior to the procedure. 5)  Copy sent to : Dr Candise Bowens. Stone 6)  The medication list was reviewed and reconciled.  All changed / newly prescribed medications were explained.  A complete medication list was provided to the patient / caregiver. Prescriptions: MOVIPREP 100 GM  SOLR (PEG-KCL-NACL-NASULF-NA ASC-C) As per prep instructions.  #1 x 0   Entered by:   Lamona Curl CMA (AAMA)   Authorized by:   Hart Carwin MD   Signed by:   Lamona Curl CMA (AAMA) on 02/24/2010   Method used:   Electronically to        [facility]  [address] [id number] ([facility]       [address]       [place], [place]  [address]       Ph: [phone] or [phone]       Fax: [phone]   RxID:   [id number]

## 2010-04-16 ENCOUNTER — Telehealth: Payer: Self-pay | Admitting: Internal Medicine

## 2010-04-17 ENCOUNTER — Telehealth: Payer: Self-pay | Admitting: Internal Medicine

## 2010-04-18 ENCOUNTER — Encounter (AMBULATORY_SURGERY_CENTER): Payer: Medicare Other | Admitting: Internal Medicine

## 2010-04-18 ENCOUNTER — Encounter (INDEPENDENT_AMBULATORY_CARE_PROVIDER_SITE_OTHER): Payer: Self-pay | Admitting: *Deleted

## 2010-04-18 ENCOUNTER — Other Ambulatory Visit: Payer: Medicare Other

## 2010-04-18 ENCOUNTER — Other Ambulatory Visit: Payer: Self-pay | Admitting: Internal Medicine

## 2010-04-18 DIAGNOSIS — D689 Coagulation defect, unspecified: Secondary | ICD-10-CM

## 2010-04-18 DIAGNOSIS — K620 Anal polyp: Secondary | ICD-10-CM

## 2010-04-18 DIAGNOSIS — Z85048 Personal history of other malignant neoplasm of rectum, rectosigmoid junction, and anus: Secondary | ICD-10-CM

## 2010-04-18 DIAGNOSIS — D126 Benign neoplasm of colon, unspecified: Secondary | ICD-10-CM

## 2010-04-18 DIAGNOSIS — D129 Benign neoplasm of anus and anal canal: Secondary | ICD-10-CM

## 2010-04-18 DIAGNOSIS — Z1211 Encounter for screening for malignant neoplasm of colon: Secondary | ICD-10-CM

## 2010-04-18 DIAGNOSIS — D128 Benign neoplasm of rectum: Secondary | ICD-10-CM

## 2010-04-18 LAB — PROTIME-INR
INR: 2.4 ratio — ABNORMAL HIGH (ref 0.8–1.0)
Prothrombin Time: 24.7 s — ABNORMAL HIGH (ref 10.2–12.4)

## 2010-04-22 ENCOUNTER — Encounter: Payer: Self-pay | Admitting: Internal Medicine

## 2010-04-24 NOTE — Procedures (Addendum)
Summary: Colonoscopy  Patient: Victoria Fisher Note: All result statuses are Final unless otherwise noted.  Tests: (1) Colonoscopy (COL)   COL Colonoscopy           DONE     [facility]     [address]     [place], [place]  [address]          COLONOSCOPY PROCEDURE REPORT          PATIENT:  Victoria Fisher, Victoria Fisher  MR#:  [id number]     BIRTHDATE:  10/19/1934, 76 yrs. old  GENDER:  female     ENDOSCOPIST:  Hedwig Morton. Juanda Chance, MD     REF. BY:  Ace Gins, M.D.     PROCEDURE DATE:  04/18/2010     PROCEDURE:  Colonoscopy 21308     ASA CLASS:  Class III     INDICATIONS:  history of colon cancer adenocarcinoma in sigmoid     polyp in 1984     on Coumadin for at.fib, INR 2.4 today     last colon. 2001     MEDICATIONS:   propofol (Diprivan) 200 mg          DESCRIPTION OF PROCEDURE:   After the risks benefits and     alternatives of the procedure were thoroughly explained, informed     consent was obtained.  Digital rectal exam was performed and     revealed no rectal masses.   The LB 180AL [id number] endoscope was     introduced through the anus and advanced to the cecum, which was     identified by both the appendix and ileocecal valve, without     limitations.  The quality of the prep was good, using MiraLax.     The instrument was then slowly withdrawn as the colon was fully     examined.     <<PROCEDUREIMAGES>>          FINDINGS:  Three polyps were found. 2mm, 3mm and 2mm polyp removed     from right colon and rectum The polyps were removed using cold     biopsy forceps (see image1, image8, and image7).  This was     otherwise a normal examination of the colon (see image9, image6,     image5, image4, and image2). s/p prior segmental resection     Retroflexed views in the rectum revealed no abnormalities.    The     scope was then withdrawn from the patient and the procedure     completed.          COMPLICATIONS:  None     ENDOSCOPIC IMPRESSION:     1) Three polyps  2) Otherwise normal examination     3 small 1-3 mm polyps removed with cold biopsies, no evidence of     recurrent cancer     RECOMMENDATIONS:     1) Await pathology results     2) High fiber diet.     h     REPEAT EXAM:  In 7 year(s) for.  consider 7 years for recall     depending on polyp pathology          ______________________________     Hedwig Morton. Juanda Chance, MD          CC:          n.     eSIGNED:   Hedwig Morton. Lyndall Windt at 04/18/2010 02:21 PM          Doristine Counter,  [place], Brandi [phone]  Note: An exclamation mark (!) indicates a result that was not dispersed into the flowsheet. Document Creation Date: 04/18/2010 2:21 PM _______________________________________________________________________  (1) Order result status: Final Collection or observation date-time: 04/18/2010 14:09 Requested date-time:  Receipt date-time:  Reported date-time:  Referring Physician:   Ordering Physician: Lina Sar [phone]) Specimen Source:  Source: Launa Grill Order Number: [id number] Lab site:   Appended Document: Colonoscopy     Procedures Next Due Date:    Colonoscopy: 04/2015

## 2010-04-24 NOTE — Progress Notes (Signed)
Summary: Prep Questions  Phone Note Call from Patient Call back at Home Phone [phone]   Caller: Patient Call For: Dr. Juanda Chance Reason for Call: Talk to Nurse Summary of Call: Patient has prep questions Initial call taken by: [place] Johnson,  April 16, 2010 10:09 AM  Follow-up for Phone Call        Patient called because she accidently got her husbands prep instructions mixed up with her instructions. She has the Moviprep instructions and is now clear on her prep. Follow-up by: Jesse Fall RN,  April 16, 2010 10:27 AM

## 2010-04-24 NOTE — Progress Notes (Signed)
Summary: ? re prep and labs   Phone Note Call from Patient Call back at Home Phone [phone]   Caller: Patient Call For: Dr Juanda Chance Reason for Call: Talk to Nurse Summary of Call: Patient wants to speak to nurse regarding bloodwork she has to have done before proc tomorrow. Initial call taken by: Tawni Levy,  April 17, 2010 9:06 AM  Follow-up for Phone Call        Pt questioned if she could start her prep a "little early tomorrow so I can get there in time to get my blood drawn.  It takes me 30 minutes to get there."  Ok given and no further questions at this time Follow-up by: Karl Bales RN,  April 17, 2010 9:31 AM

## 2010-04-29 NOTE — Letter (Signed)
Summary: Patient Notice- Polyp Results  [facility]  [address] [place], [place] [address]   Phone: [phone]  Fax: [phone]        April 22, 2010 MRN: [id number]    Victoria Fisher [address] [place], [place]  [address]    Dear [name],  I am pleased to inform you that the colon polyp(s) removed during your recent colonoscopy was (were) found to be benign (no cancer detected) upon pathologic examination.Both polyps were adenomatous ( precancerous)  I recommend you have a repeat colonoscopy examination in 5_ years to look for recurrent polyps, as having colon polyps increases your risk for having recurrent polyps or even colon cancer in the future.  Should you develop new or worsening symptoms of abdominal pain, bowel habit changes or bleeding from the rectum or bowels, please schedule an evaluation with either your primary care physician or with me.  Additional information/recommendations:  _x_ No further action with gastroenterology is needed at this time. Please      follow-up with your primary care physician for your other healthcare      needs.  __ Please call [phone] to schedule a return visit to review your      situation.  __ Please keep your follow-up visit as already scheduled.  __ Continue treatment plan as outlined the day of your exam.  Please call us if you are having persistent problems or have questions about your condition that have not been fully answered at this time.  Sincerely,  Hart Carwin MD  This letter has been electronically signed by your physician.  Appended Document: Patient Notice- Polyp Results letter mailed

## 2010-06-02 LAB — CARDIAC PANEL(CRET KIN+CKTOT+MB+TROPI)
CK, MB: 1.9 ng/mL (ref 0.3–4.0)
Relative Index: INVALID (ref 0.0–2.5)
Relative Index: INVALID (ref 0.0–2.5)
Total CK: 41 U/L (ref 7–177)
Total CK: 44 U/L (ref 7–177)
Total CK: 59 U/L (ref 7–177)
Troponin I: <0.01 ng/mL (ref 0.00–0.06)

## 2010-06-02 LAB — CBC
HCT: 42.8 % (ref 36.0–46.0)
Hemoglobin: 14 g/dL (ref 12.0–15.0)
MCHC: 32.7 g/dL (ref 30.0–36.0)
MCHC: 32.7 g/dL (ref 30.0–36.0)
MCV: 91.8 fL (ref 78.0–100.0)
RBC: 4.44 MIL/uL (ref 3.87–5.11)
RBC: 4.66 MIL/uL (ref 3.87–5.11)
RDW: 14.3 % (ref 11.5–15.5)
WBC: 7.7 10*3/uL (ref 4.0–10.5)

## 2010-06-02 LAB — DIFFERENTIAL
Basophils Relative: 1 % (ref 0–1)
Eosinophils Absolute: 0.1 10*3/uL (ref 0.0–0.7)
Eosinophils Absolute: 0.2 10*3/uL (ref 0.0–0.7)
Eosinophils Relative: 2 % (ref 0–5)
Lymphs Abs: 1.7 10*3/uL (ref 0.7–4.0)
Lymphs Abs: 1.8 10*3/uL (ref 0.7–4.0)
Monocytes Absolute: 0.5 10*3/uL (ref 0.1–1.0)
Monocytes Relative: 6 % (ref 3–12)
Monocytes Relative: 6 % (ref 3–12)
Neutrophils Relative %: 69 % (ref 43–77)

## 2010-06-02 LAB — COMPREHENSIVE METABOLIC PANEL
ALT: 17 U/L (ref 0–35)
AST: 26 U/L (ref 0–37)
Alkaline Phosphatase: 69 U/L (ref 39–117)
CO2: 32 mEq/L (ref 19–32)
Calcium: 10.3 mg/dL (ref 8.4–10.5)
GFR calc Af Amer: >60 mL/min (ref >60)
Potassium: 4.1 mEq/L (ref 3.5–5.1)
Sodium: 141 mEq/L (ref 135–145)
Total Protein: 7 g/dL (ref 6.0–8.3)

## 2010-06-02 LAB — BASIC METABOLIC PANEL
CO2: 29 mEq/L (ref 19–32)
Chloride: 105 mEq/L (ref 96–112)
GFR calc Af Amer: >60 mL/min (ref >60)
Potassium: 3.9 mEq/L (ref 3.5–5.1)
Sodium: 139 mEq/L (ref 135–145)

## 2010-06-02 LAB — BRAIN NATRIURETIC PEPTIDE: Pro B Natriuretic peptide (BNP): 223 pg/mL — ABNORMAL HIGH (ref 0.0–100.0)

## 2010-06-02 LAB — APTT: aPTT: 46 seconds — ABNORMAL HIGH (ref 24–37)

## 2010-06-02 LAB — PROTIME-INR
INR: 2 — ABNORMAL HIGH (ref 0.00–1.49)
INR: 2.3 — ABNORMAL HIGH (ref 0.00–1.49)

## 2010-06-02 LAB — HEMOGLOBIN A1C
Hgb A1c MFr Bld: 6.1 % (ref 4.6–6.1)
Mean Plasma Glucose: 128 mg/dL

## 2010-06-02 LAB — CALCIUM, IONIZED: Calcium, Ion: 1.37 mmol/L — ABNORMAL HIGH (ref 1.12–1.32)

## 2010-06-02 LAB — LIPID PANEL
Total CHOL/HDL Ratio: 3 RATIO
VLDL: 15 mg/dL (ref 0–40)

## 2010-07-01 NOTE — H&P (Signed)
Victoria Fisher, Victoria Fisher              ACCOUNT NO.:  [id number]   MEDICAL RECORD NO.:  [id number]          PATIENT TYPE:  INP   LOCATION:  2025                         FACILITY:  [facility]   PHYSICIAN:  Carlena Hurl, MDDATE OF BIRTH:  1934-08-17   DATE OF ADMISSION:  03/11/2008  DATE OF DISCHARGE:                              HISTORY & PHYSICAL   CHIEF COMPLAINT:  One-month history of headache and 1-day history of  numbness of the left hand.   HISTORY OF PRESENT ILLNESS:  This is a 76 year old very pleasant  Caucasian female who has a past medical history significant for chronic  atrial fibrillation, history of hypertension, and history of MI in the  past, who is coming in with 99-month history of severe headache and 1-day  history of numbness of her left hand.  The history is provided by the  patient.  According to her, she has been having severe headache for the  past 1 month especially on the right temporal region, and she actually  had been to her primary care physician, which she started going there as  a new patient but according to her, the workup is still under process  for her headache.  She has been taking over-the-counter pain medications  and as her pain medications are not helping with her headache and  yesterday, she noticed severe numbness of her left hand and she was  unable to move her fingers and this is when, she has decided to come to  the ER.  When she came into the ER, her blood pressure was found to be  elevated around 175/99 and a few hours later in the ER, the patient's  numbness has improved.  Her blood pressure has come down to 108/90.  She  says that she usually checks her blood pressures at home very often,  sometimes 3-4 times a day because of the headaches and she says that her  blood pressures are all over the places and sometimes they are high and  sometimes they are low.  She denies having any chest pain or trouble  breathing.  No nausea, vomiting.   No headache.  No abdominal pain.  No  diarrhea, no constipation.  She says that she often feels dizzy during  the process of the headaches whenever she gets it.  No associated blurry  vision.   PAST MEDICAL HISTORY:  Significant for:  1. Chronic atrial fibrillation.  2. History of MI with a cardiac cath done in 2004, which showed patent      coronary arteries except for a tiny old occlusion of an optimal      diagonal branch of the proximal left anterior descending, there is      normal renal arteries, normal abdominal aorta.  3. History of hyperlipidemia.  4. History of Milroy disease.  5. Hypothyroidism.  6. COPD.  7. Asthma.   MEDICATIONS AT HOME:  1. Albuterol inhaler.  2. Aspirin 81 mg p.o. 1 time a day.  3. Spiriva inhaler.  4. Coumadin 2 mg 1 time a day.  5. Lipitor 40 mg p.o. 1 time a day.  6. Morphine as needed.  7. Sertraline 50 mg p.o. 1 time a day.  8. Synthroid 112 mcg p.o. 1 time a day.  9. Toprol-XL 50 mg p.o. 1 time a day.  10.Torsemide to be taken only as needed.   SURGICAL HISTORY:  Not significant.   SOCIAL HISTORY:  Denies alcohol, IV drug abuse, and no smoking.   FAMILY HISTORY:  Not significant.   REVIEW OF SYSTEMS:  Chronic headache for the past 1 month and history of  dizziness on and off.   PHYSICAL EXAMINATION:  GENERAL:  This is a 76 year old very pleasant  Caucasian lady who is lying comfortably on the bed without any acute  chest pain or severe shortness of breath.  VITALS:  He blood pressure when she came into the ER 175/99 with pulse  rate of 74, respirations 17, and temperature 98.  Later, her blood  pressure dropped to 108/90.  HEENT:  Head is atraumatic, normocephalic.  Pupils PERRLA.  NECK:  Supple.  No JVD appreciated.  No goiter noted.  LUNGS:  Clear to auscultation bilaterally.  CVS:  S1, S2 heard with irregular rhythm.  No murmurs, no rubs  appreciated.  ABDOMEN:  Soft.  Bowel sounds present, nontender, and nondistended.   EXTREMITIES:  No pedal edema noted.  Pulses palpable bilaterally.   LABORATORY DATA:  WBC 7.7, hemoglobin 14.0, hematocrit 42.8, platelets  of 194.  ESR of 4, INR of 2.3.  Sodium 139, potassium 3.9, chloride 105,  bicarb 29, glucose 98, BUN 9, creatinine 0.59, calcium of 10.4.  The  patient had a CT head without contrast because of her headache and  hypertension, which showed no acute intracranial abnormality and chest x-  ray showed cardiomegaly without any acute disease.   ASSESSMENT AND PLAN:  This is a 76 year old pleasant Caucasian lady who  is coming in with 52-month history of headaches, labile hypertension, and  1-day history of numbness of her left hand.  1. Hypertensive urgency.  As the patient's blood pressure has been      high when she came into the ER and there was no end-organ damage,      the patient's numbness improved after coming to the ER,  her blood      pressures have come down to 108/90, at this time we are going to      continue her home dose of Toprol-XL 50 mg and we will monitor her      blood pressures here and if she continues to have changes in her      blood pressures, we will evaluate this labile hypertension more.  2. Labile hypertension.  The patient usually has a history of      hypertension.  She says that her blood pressures usually runs low,      but for the past weeks, they have been running high and sometimes,      her blood pressure goes very low, but she denies having any      associated symptoms like nausea, vomiting.  No palpitations.  We      will monitor her blood pressure at this time and continue her home      dose of Toprol-XL.  3. History of hypothyroidism.  We will start the patient on home dose      of Synthroid.  4. Hyperlipidemia.  Continue home dose of Lipitor that is 40 mg p.o. 1      time a day.  5. History of chronic atrial  fibrillation.  The patient's heart rate      is well controlled and she takes Coumadin 2 mg p.o. 1 time a  day      and INR is therapeutic at 2.3, so we will continue that and monitor      her PT and INR.  The patient will be placed in the tele monitor      bed.  6. History of asthma.  The patient will be given albuterol and      Atrovent nebulizers.  The patient is clinically stable and      saturating 100% on room air.  7. Deep venous thrombosis prophylaxis.  The patient is therapeutic      with INR at 2.3.  8. Gastrointestinal prophylaxis.  The patient will be started on      Protonix 40 mg p.o. 1 time a day.   Disposition is to home once the patient is clinically stable.      Carlena Hurl, MD  Electronically Signed     JD/MEDQ  D:  03/12/2008  T:  03/12/2008  Job:  [id number]

## 2010-07-01 NOTE — Discharge Summary (Signed)
NAMEMALEIYA, Victoria Fisher              ACCOUNT NO.:  [id number]   MEDICAL RECORD NO.:  [id number]          PATIENT TYPE:  INP   LOCATION:  2025                         FACILITY:  [facility]   PHYSICIAN:  Richarda Overlie, MD       DATE OF BIRTH:  [birth year]   DATE OF ADMISSION:  03/11/2008  DATE OF DISCHARGE:  03/13/2008                               DISCHARGE SUMMARY   Kindly see discharge summary from the January 25 for further details of  the patient's hospitalization.  The patient's blood pressure has  normalized today.  Her physical exam shows blood pressure 133/77, pulse  of 90, temperature 98.5, 91% on room air.  The patient is alert and  oriented x3.  LUNGS:  Clear to auscultation bilaterally.  No wheezes, no crackles, no  rhonchi.  CARDIOVASCULAR:  Regular rate and rhythm.  No appreciable murmurs, rubs  or gallops.  ABDOMEN:  Soft, nontender, nondistended.  EXTREMITIES: Without cyanosis, clubbing or edema.   INR of 2.   Serial troponins remained negative.  Cardiac echo shows normal LV  function with normal systolic function, mildly dilated right ventricle  with normal systolic function, moderate tricuspid regurgitation.   ASSESSMENT AND PLAN:  1. Recurrent headaches:  The patient was scheduled for an MRI/MRA of      the brain but the patient was intolerant to the MRI because of      claustrophobia and being scheduled for an open MRI at [facility] on January 31 at 4:45 p.m.  The patient is also being      started on gabapentin 300 mg p.o. at bedtime for prophylactic      treatment for her recurrent headache.  She does have a history of      migraines.  The patient advised to follow up with the PCP after the      MRI is done.  Blood pressure control will also help in preventing      recurrent headaches and the patient's antihypertensive medication      has been adjusted.  2. Atrial fibrillation:  Patient patient therapeutic on her INR today.      She is to follow up  with regular checks of her INR as previously      scheduled.  The patient is being ambulated today and is being      discharged in stable condition.      Richarda Overlie, MD  Electronically Signed     NA/MEDQ  D:  03/13/2008  T:  03/13/2008  Job:  [id number]

## 2010-07-01 NOTE — Discharge Summary (Signed)
Victoria Fisher, Victoria Fisher              ACCOUNT NO.:  [id number]   MEDICAL RECORD NO.:  [id number]          PATIENT TYPE:  INP   LOCATION:  2025                         FACILITY:  [facility]   PHYSICIAN:  Lucita Ferrara, MD         DATE OF BIRTH:  [birth year]   DATE OF ADMISSION:  03/11/2008  DATE OF DISCHARGE:  03/13/2008                               DISCHARGE SUMMARY   PRIMARY CARE DOCTOR:  Dr. Smith Mince.   DISCHARGE DIAGNOSES:  1. Hypertensive urgency.  2. Right arm numbness.  3. Splitting headache.  4. Labile blood pressures.  5. History of coronary artery disease and myocardial infarction.  6. History of atrial fibrillation on chronic Coumadin therapy,      therapeutic, and currently normal sinus rhythm.   CONSULTATIONS:  None.   PROCEDURES:  The patient had a chest x-ray dated March 12, 2008, which  showed no acute cardiomegaly noted.  CT scan of the head which showed no  acute intracranial abnormalities.  Mild microvascular ischemia in the  white matter bilaterally.  Tests that are still pending include MRI, MRA  of the brain and circle of Willis, bilateral carotid Dopplers, 2-D  echocardiogram.   HISTORY OF PRESENT ILLNESS:  Ms. Troupe is a 76 year old who presented  to [facility] with labile blood pressures, intractable  headache, headache 10/10 intensity, symptoms have been going on for the  last 2 months.  The patient also has a history of coronary artery  disease and myocardial infarction, rapid atrial fibrillation.  She was  admitted to medical telemetry floor and workup was initiated.  Her blood  pressure nicely stabilized and currently her blood pressure is 150/80.  She is afebrile.  She has no focal neurological deficits.  She is alert  and oriented x3 pain, and her cranial nerves II-XII are grossly intact.  Workup for syncopal episode and TIA versus CVA symptoms are still  pending including MRI/MRA of the brain, 2-D echocardiogram, and  bilateral carotid  Dopplers.  Will also give physical therapy,  occupational therapy involved as well.   DISCHARGE MEDICATIONS:  1. Aspirin 81 mg daily.  2. Synthroid 112 mcg daily.  3. Metoprolol 50 mg daily.  4. Crestor 20 mg daily.  5. Zoloft 50 mg daily  6. Coumadin specialized dosing for goal INR between 2 and 3.   DISCHARGE DISPOSITION:  Pending.      Lucita Ferrara, MD  Electronically Signed     RR/MEDQ  D:  03/12/2008  T:  03/12/2008  Job:  [id number]

## 2010-07-04 NOTE — Op Note (Signed)
NAME:  Victoria Fisher, Victoria Fisher                        ACCOUNT NO.:  [id number]   MEDICAL RECORD NO.:  [id number]                   PATIENT TYPE:  INP   LOCATION:  4706                                 FACILITY:  [facility]   PHYSICIAN:  Oley Balm. Sung Amabile, M.D. [facility]          DATE OF BIRTH:  01-08-1935   DATE OF PROCEDURE:  02/23/2003  DATE OF DISCHARGE:  02/17/2003                                 OPERATIVE REPORT   PROCEDURE:  Bronchoscopy.   INDICATIONS FOR PROCEDURE:  Mass like density in superior segment of the  right lower lobe.   PREMEDICATION:  Fentanyl 100 mcg IV, Versed 8 mg IV.   ANESTHESIA:  Topical anesthesia was applied to the nose and throat and 1%  lidocaine was used during the course of the procedure.   DESCRIPTION OF PROCEDURE:  After adequate sedation and anesthesia, the  bronchoscope was introduced via the left naris and advanced into the  posterior pharynx. This demonstrated a normal upper airway anatomy.  Vocal  cords moved symmetrically.  Vocal cord anesthesia was achieved with 1%  lidocaine and the scope was advanced into the trachea.  Airway anesthesia  was achieved with 1% lidocaine and an airway examination was performed. This  demonstrated normal segmental airway anatomy.  Bronchial mucosa exhibited  changes consistent with bronchitis. The bronchus to the superior segment of  the right lower lobe appeared somewhat narrowed with erythema but no  discreet obstruction.  There were no endobronchial tumors, masses or foreign  bodies. At the completion of the airway examination, the bronchoscope was  advanced into the right lower lobe segment where washings and brushings were  obtained.  During this period of the procedure, the patient began to develop  significant agitation and I was uncomfortable providing more sedation  medicines.  I did attempt transbronchial biopsies but could not successfully  cannulate the right lower lobe segment with the transbronchial biopsy  forceps.  Therefore the procedure was terminated at this point. The only  complication was mild airway bleeding after brushings were obtained. This  resolved spontaneously.  However, it did impair visualization making  transbronchial biopsies more difficult.  The above specimens were sent for  washings and brushings.  The patient tolerated the procedure well otherwise.                                               Oley Balm Sung Amabile, M.D. [facility]    DBS/MEDQ  D:  02/23/2003  T:  02/24/2003  Job:  [id number]

## 2010-07-04 NOTE — Cardiovascular Report (Signed)
NAME:  Victoria Fisher, Victoria Fisher                        ACCOUNT NO.:  [id number]   MEDICAL RECORD NO.:  [id number]                   PATIENT TYPE:  INP   LOCATION:  4706                                 FACILITY:  [facility]   PHYSICIAN:  Madaline Savage, M.D.             DATE OF BIRTH:  07-Mar-1934   DATE OF PROCEDURE:  02/12/2003  DATE OF DISCHARGE:                              CARDIAC CATHETERIZATION   PROCEDURES PERFORMED:  1. Selective coronary angiography by Judkins technique.  2. Retrograde left heart catheterization.  3. Left ventricular angiography.  4. Abdominal aortography.  5. Right percutaneous femoral AngioSeal arteriotomy.   ENTRY SITE:  Right femoral.   DYE USED:  Omnipaque.   COMPLICATIONS:  None.   PATIENT PROFILE:  The patient is a 76 year old woman with chronic atrial  fibrillation, chronic obstructive lung disease, asthma, chronic Coumadin  therapy, hyperlipidemia, hypertension, and hypothyroidism who entered the  hospital for hemoptysis.  Her Coumadin was held because of it.  The patient  is being evaluated for a lung lesion by Dr. Billy Fischer.  She underwent  bronchoscopy on the day before this procedure and those results are pending.  Today she enters the catheterization laboratory with an INR less than 1.5  since Coumadin has been held for diagnostic cardiac catheterization for  recent chest pains.  The patient has had previous closure of a small  diagonal branch in the proximal LAD.   RESULTS:  PRESSURES:  The left ventricular pressure was 130/5, end-diastolic  pressure 16.  Central aortic pressure was 130/75, mean of 100.  No aortic  valve gradient by pullback technique.   ANGIOGRAPHIC RESULTS:  1. The left main coronary artery was normal.  2. The left anterior descending coronary artery courses to the cardiac apex     and is normal.  The major diagonal branch arising along the anterolateral     wall is normal.  There is a tiny stump of a first diagonal  branch arising     as an optional diagonal in the very proximal portion of the LAD.  This is     the site of an old occluded diagonal.  3. The left circumflex gives rise to a bifurcating second obtuse marginal     branch and a very small normal first obtuse marginal.  The mid and distal     circumflex beyond the second obtuse marginal branch is normal.  4. The right coronary artery is a dominant vessel and is normal.  5. The left ventricular angiogram shows a left ventricular ejection fraction     of 50% with no mitral regurgitation and no wall motion abnormalities.  6. Abdominal aorta is normal as are the renal arteries and the common     iliacs.   FINAL DIAGNOSES:  1. Angiographically patent coronary arteries except for a tiny old occlusion     of an optional diagonal branch off the proximal left anterior descending.  2. Low normal left ventricular systolic function.  3. Normal abdominal aorta.  4. Normal renal arteries.   PLAN:  The patient will need to be reloaded on her Coumadin for prevention  of stroke and other embolic events and will be able to go home when PT/INR  at target of 2.0.                                               Madaline Savage, M.D.    WHG/MEDQ  D:  02/15/2003  T:  02/16/2003  Job:  [id number]   cc:   Talmadge Coventry, M.D.  [address], [address]  [place]  [place] [address]  Fax: [phone]   Oley Balm. Sung Amabile, M.D. [facility]

## 2010-07-04 NOTE — Discharge Summary (Signed)
NAME:  Victoria Fisher, Victoria Fisher                        ACCOUNT NO.:  [id number]   MEDICAL RECORD NO.:  [id number]                   PATIENT TYPE:  INP   LOCATION:  4706                                 FACILITY:  [facility]   PHYSICIAN:  Madaline Savage, M.D.             DATE OF BIRTH:  28-Apr-1934   DATE OF ADMISSION:  02/12/2003  DATE OF DISCHARGE:  02/17/2003                                 DISCHARGE SUMMARY   ADMISSION DIAGNOSES:  1. Status post recent fever, cough and hemoptysis with questionable mass on     both x-ray and followup CT scan.  2. History of chronic persistent atrial fibrillation, on chronic Coumadin     therapy.  3. History of asthma and chronic obstructive pulmonary disease.  4. Hyperlipidemia.  5. Milroy's disease.  6. Coronary artery disease.     a. Last cardiac catheterization revealed occluded diagonal vessel but no        other coronary artery disease.     b. Normal left ventricular function.     c. Last Cardiolite scan, September 2004, with inferolateral ischemia        which is felt to represent occluded diagonal.  This is unchanged from        prior Cardiolite of February 2004.  7. Hypothyroidism.   DISCHARGE DIAGNOSES:  1. Status post recent fever, cough and hemoptysis with questionable mass on     both x-ray and followup CT scan.  2. History of chronic persistent atrial fibrillation, on chronic Coumadin     therapy.  3. History of asthma and chronic obstructive pulmonary disease.  4. Hyperlipidemia.  5. Milroy's disease.  6. Coronary artery disease.     a. Last cardiac catheterization revealed occluded diagonal vessel but no        other coronary artery disease.     b. Normal left ventricular function.     c. Last Cardiolite scan, September 2004, with inferolateral ischemia        which is felt to represent occluded diagonal.  This is unchanged from        prior Cardiolite of February 2004.  7. Hypothyroidism.  8. Status post bronchoscopy on February 14, 2003 by Dr. Onalee Hua B. Simonds.     There was inability to adequately sedate and there was a difficult     location which prohibited biopsy.  At that point, the bronchoscopy     specimens revealed no malignancy.  However, this did not absolutely rule     out cancer.  Dr. Sung Amabile plans to see the patient back in followup in 3     to 4 weeks.  9. Status post cardiac catheterization by Dr. Madaline Savage on February 15, 2003 revealing old occluded diagonal, otherwise, normal coronary     arteries, ejection fraction 50%.   HISTORY OF PRESENT ILLNESS:  Victoria Fisher is a very pleasant 76 year old  white  female with a history of chronic persistent atrial fibrillation, on  chronic Coumadin therapy.  She also has a history of asthma and COPD as well  as hyperlipidemia.  She has known CAD.  Her last cardiac catheterization  revealed an occluded diagonal vessel but no other coronary artery disease.  She had normal LV function.   Approximately 3 weeks ago, she developed fever, diaphoresis and cough which  progressed to hemoptysis.  She had been evaluated by Dr. Talmadge Coventry  for this.  She had an x-ray that apparently was abnormal and had a  subsequent CT of the chest which confirmed a possible mass in the right lung  per the patient.   Apparently, Dr. Sung Amabile was informed of the findings and apparently was  originally planned for an outpatient pulmonary evaluation but apparently the  plans changed and it is to be an inpatient evaluation for Coumadin-to-  heparin crossover.   She was now admitted for Coumadin-to-heparin crossover with pulmonary  evaluation.   HOSPITAL COURSE:  On February 13, 2003, Ms. Gritz was seen in pulmonary  consultation.  Dr. Sung Amabile reviewed the x-rays.  It was noted that she had a  rounded density in the superior segment of the right lobe of the right lung.  It was felt that this could be infectious versus malignant.  It was felt  that due to her need for  anticoagulation and her current hemoptysis, it  would be best to go ahead and proceed with bronchoscopy rather than take a  wait-and-watch approach.   She underwent bronchoscopy on February 14, 2003.  Unfortunately, this was  technically difficult.  In followup several days later, Dr. Sung Amabile showed  that the bronchoscopy specimens revealed no malignancy.  However, this did  not absolutely rule out cancer.  Therefore, he would plan to see the patient  back in his office for followup evaluation in 3 to 4 weeks to follow this  up.   It was felt that because of her chest pain and the fact that she was off any  Coumadin at this time with this, Dr. Elsie Lincoln also planned to proceed with  definitive cardiac catheterization.  This was performed on February 15, 2003.  She was found to have an old occluded diagonal vessel, but the  remainder of her coronary arteries were patent with no significant disease.  EF was 50%.  She tolerated the procedure well without complications.   On the following morning, the right groin site was stable, Coumadin was  restarted and she was back on IV heparin.  Over the next days, Dr. Sung Amabile  had followed up as above.  We continued IV heparin and Coumadin.  Finally,  by February 17, 2003, her INR was at 1.9 and this was felt to be stable for  discharge home.  At this point, she is without significant complaints.  She  is afebrile with pulse of 86, blood pressure of 96/46.  Telemetry shows  atrial fibrillation.  Lungs are clear.  Heart is irregular rhythm.  No lower  extremity edema.  Her groin site has healed.  At this point, she was seen  and evaluated by Dr. Cristy Hilts. Ganji, who deems her stable for discharge home.   HOSPITAL CONSULTS:  Pulmonary consultation on February 13, 2003 by Dr. Billy Fischer for evaluation of possible mass. There was hemoptysis.   HOSPITAL PROCEDURES: 1. Bronchoscopy performed by Dr. Billy Fischer on February 14, 2003.  See     his report  for details.  2. Status post cardiac catheterization, February 15, 2003.  There was an old     occluded diagonal vessel.  All other coronaries were patent with no     significant disease.  Ejection fraction 50%.  The patient tolerated the     procedure well without complications.   LABORATORIES:  Sodium 142, potassium 3.5, chloride 107, glucose 111, BUN 9,  creatinine 0.7.  INR on February 12, 2003 was 2.1; on the 28th, 1.8; the  29th, down to 1.4; by the time of discharge home, it was back up to 1.9.  CBC showed white count 8.9, hemoglobin 12.5, hematocrit 36.7, platelets  314,000.   Chest x-ray, February 13, 2003, shows a 3-cm right perihilar mass, possibly  with associated hilar lymphadenopathy.  CT scanning of the chest could be  helpful to further characterize.  Cardiomegaly.  Chest x-ray, February 15, 2003, shows stable right hilar mass.  Outpatient chest x-rays and CTs had  already been performed and were reviewed while she was in the hospital.   EKG shows atrial fibrillation, 79 beats per minute, nonspecific ST-T change.   DISCHARGE MEDICATIONS:  Continue all previous medications as before, there  should be no changes; these medicines include:  1. Morphine sulfate 60 mg a day.  2. Morphine sulfate immediate release as needed.  3. Promethazine 25 mg as needed.  4. Toprol-XL 50 mg a day.  5. Demadex 20 mg a day.  6. Potassium 10 mEq a day on the days of Demadex.  7. Coumadin 2 mg every day.  8. Zoloft 100 mg a day.  9. Lipitor 10 mg a day.  10.      Synthroid 0.1 mg a day.  11.      Advair twice a day.  12.      Albuterol as needed.  13.      Nitroglycerin as needed.   ACTIVITY:  No strenuous activity, lifting over 5 pounds, driving or sexual  activity for 3 days.   SPECIAL DISCHARGE INSTRUCTIONS:  Call [phone] if any bleeding or increased  redness at the groin site.   Have your blood drawn to check a PT/INR in 1 week.   FOLLOWUP:  She is to supposed to follow up  with Dr. Sung Amabile at [facility].  She was to call [phone] to arrange and a make a followup  appointment in the next 3 to 4 weeks.  They were supposed to have this with  chest x-ray.   On Monday, she would call [phone] to make an appointment to see Dr. Elsie Lincoln  in 2 weeks.      Mary B. Easley, P.A.-C.                   Madaline Savage, M.D.    MBE/MEDQ  D:  03/02/2003  T:  03/03/2003  Job:  [id number]   cc:   Madaline Savage, M.D.  [address]  [place]  [place] [address]  Fax: [phone]   Oley Balm. Sung Amabile, M.D. [facility]   Talmadge Coventry, M.D.  [address]  [place]  [place] [address]  Fax: [phone]

## 2010-07-04 NOTE — H&P (Signed)
NAME:  Victoria Fisher, Victoria Fisher                        ACCOUNT NO.:  [id number]   MEDICAL RECORD NO.:  [id number]                   PATIENT TYPE:  INP   LOCATION:  4706                                 FACILITY:  [facility]   PHYSICIAN:  Madaline Savage, M.D.             DATE OF BIRTH:  04-17-1934   DATE OF ADMISSION:  02/12/2003  DATE OF DISCHARGE:                                HISTORY & PHYSICAL   CHIEF COMPLAINT:  Fever, cough and hemoptysis with questionable mass on both  x-ray and follow-up CT scan.   HISTORY OF PRESENT ILLNESS:  The patient is a very pleasant 76 year old  white female with a history of chronic persistent atrial fibrillation on  chronic Coumadin therapy.  The patient has a history of asthma and COPD.  She also has hyperlipidemia and Milroy's disease and known coronary artery  disease.  Her last catheterization was in 1999 which revealed an occluded  diagonal vessel but no other coronary artery disease.  She had normal left  ventricular function.   Approximately three weeks ago she developed fever, diaphoresis and cough  which progressed to hemoptysis.  She had been evaluated by Dr. Smith Mince for  this.  She had an x-ray that apparently was abnormal and a subsequent CT of  the chest confirmed a possible mass in the right lung per the patient; we do  not have a report of that.   Apparently, Dr. Sung Amabile was informed of these findings and apparently she  was originally planned for an outpatient pulmonary evaluation, but  apparently these plans have changed to an inpatient evaluation.  We plan to  stop her Coumadin once her INR is near 2.0 with plans for admission for IV  heparin crossover.  For one she was having hemoptysis and secondly if she  would need any type of interventional testing she would obviously need to be  off of her anticoagulation for that purpose as well.   She was started on Levaquin as an outpatient from pulmonary.  She now  presents for preventive for  crossover and pulmonary evaluation.   PAST MEDICAL HISTORY:  1. History of cardiac catheterization in 1991 with an occluded diagonal     vessel, but otherwise normal coronary arteries and normal left     ventricular function.  2. Last Cardiolite scan in September 2004 with inferolateral ischemia which     was felt to represent the occluded diagonal.  This was unchanged from     prior Cardiolite scan of February 2004.  3. Chronic atrial fibrillation.  4. Chronic Coumadin anticoagulation.  5. Asthma.  6. Chronic obstructive pulmonary disease.  7. Hyperlipidemia.  8. Milroy's disease.  9. Hypertension.  10.      Hypothyroidism.   CURRENT MEDICATIONS:  1. Morphine sulfate 60 mg a day.  2. Morphine sulfate immediate release 15 mg, two to three a day.  3. Promethazine 25 mg p.r.n.  4. Toprol XL  50 mg a day.  5. Demadex 20 mg p.r.n.  6. Potassium 10 mEq on day of Demadex.  7. Coumadin 2 mg a day as directed.  8. Zoloft 100 mg a day.  9. Lipitor 10 mg a day.  10.      Nitroglycerin spray as needed.  11.      Synthroid 0.1 mg a day.  12.      Advair twice a day.  13.      Albuterol p.r.n.   ALLERGIES:  1. PENICILLIN.  2. SULFA.  3. CODEINE.   SOCIAL HISTORY:   FAMILY HISTORY:   REVIEW OF SYMPTOMS:  See our office status sheet.   PHYSICAL EXAMINATION:  VITAL SIGNS:  Blood pressure is 124/87, heart rate  100, temperature 98.7, respirations 18, oxygen saturation is 95% on room  air.  GENERAL:  Pleasant white female in no acute distress.  She is alert and  oriented times three.  HEENT:  The neck is supple.  There is no JVD.  2+ carotid pulses without  bruits.  HEART:  Irregular rhythm, rate controlled, distant S1 and S2.  LUNGS:  Clear throughout.  ABDOMEN:  Soft but obese.  Positive bowel sounds.  EXTREMITIES:  No lower extremity edema.  She has 2+ pulses.   LABORATORY DATA:  INR today is 1.99.  Other laboratories are pending.   EKG from today is pending.   IMPRESSION  AND PLAN:  1. Recent fevers and sweats associated with cough and hemoptysis.  Recent     abnormal chest x-ray and CT suggestive of right lung mass.  2. Atrial fibrillation with Coumadin on hold and now on IV heparin.  3. Coronary artery disease.  4. Chronic atrial fibrillation.  5. Hypertension.  6. Hyperlipidemia.   At this time, Dr. Sung Amabile has been consulted and will plan to see the  patient in the morning.  He had already started Levaquin as an outpatient  and this will be continued.  We will continue to hold Coumadin until he  confirms his plans for any testing and evaluations.      Mary B. Easley, P.A.-C.                   Madaline Savage, M.D.    MBE/MEDQ  D:  02/12/2003  T:  02/12/2003  Job:  [id number]   cc:   Talmadge Coventry, M.D.  [address]  [place]  [place] [address]  Fax: [phone]   Oley Balm. Sung Amabile, M.D. [facility]

## 2010-07-23 HISTORY — PX: COLONOSCOPY: SHX174

## 2010-09-16 ENCOUNTER — Other Ambulatory Visit: Payer: Self-pay | Admitting: Endodontics

## 2011-03-02 DIAGNOSIS — Z7901 Long term (current) use of anticoagulants: Secondary | ICD-10-CM | POA: Diagnosis not present

## 2011-03-02 DIAGNOSIS — I4891 Unspecified atrial fibrillation: Secondary | ICD-10-CM | POA: Diagnosis not present

## 2011-03-30 DIAGNOSIS — I4891 Unspecified atrial fibrillation: Secondary | ICD-10-CM | POA: Diagnosis not present

## 2011-03-30 DIAGNOSIS — Z7901 Long term (current) use of anticoagulants: Secondary | ICD-10-CM | POA: Diagnosis not present

## 2011-04-27 DIAGNOSIS — I4891 Unspecified atrial fibrillation: Secondary | ICD-10-CM | POA: Diagnosis not present

## 2011-04-27 DIAGNOSIS — Z7901 Long term (current) use of anticoagulants: Secondary | ICD-10-CM | POA: Diagnosis not present

## 2011-06-01 DIAGNOSIS — I4891 Unspecified atrial fibrillation: Secondary | ICD-10-CM | POA: Diagnosis not present

## 2011-06-01 DIAGNOSIS — Z7901 Long term (current) use of anticoagulants: Secondary | ICD-10-CM | POA: Diagnosis not present

## 2011-06-29 DIAGNOSIS — Z7901 Long term (current) use of anticoagulants: Secondary | ICD-10-CM | POA: Diagnosis not present

## 2011-06-29 DIAGNOSIS — I4891 Unspecified atrial fibrillation: Secondary | ICD-10-CM | POA: Diagnosis not present

## 2011-07-21 DIAGNOSIS — I4891 Unspecified atrial fibrillation: Secondary | ICD-10-CM | POA: Diagnosis not present

## 2011-07-21 DIAGNOSIS — Z79899 Other long term (current) drug therapy: Secondary | ICD-10-CM | POA: Diagnosis not present

## 2011-07-21 DIAGNOSIS — R0602 Shortness of breath: Secondary | ICD-10-CM | POA: Diagnosis not present

## 2011-07-21 DIAGNOSIS — R609 Edema, unspecified: Secondary | ICD-10-CM | POA: Diagnosis not present

## 2011-07-21 DIAGNOSIS — I509 Heart failure, unspecified: Secondary | ICD-10-CM | POA: Diagnosis not present

## 2011-07-22 DIAGNOSIS — I4891 Unspecified atrial fibrillation: Secondary | ICD-10-CM | POA: Diagnosis not present

## 2011-07-22 DIAGNOSIS — R0602 Shortness of breath: Secondary | ICD-10-CM | POA: Diagnosis not present

## 2011-07-30 DIAGNOSIS — I4891 Unspecified atrial fibrillation: Secondary | ICD-10-CM | POA: Diagnosis not present

## 2011-07-30 DIAGNOSIS — I509 Heart failure, unspecified: Secondary | ICD-10-CM | POA: Diagnosis not present

## 2011-07-30 DIAGNOSIS — G4733 Obstructive sleep apnea (adult) (pediatric): Secondary | ICD-10-CM | POA: Diagnosis not present

## 2011-08-03 DIAGNOSIS — E785 Hyperlipidemia, unspecified: Secondary | ICD-10-CM | POA: Diagnosis not present

## 2011-08-03 DIAGNOSIS — I10 Essential (primary) hypertension: Secondary | ICD-10-CM | POA: Diagnosis not present

## 2011-08-10 DIAGNOSIS — Z1212 Encounter for screening for malignant neoplasm of rectum: Secondary | ICD-10-CM | POA: Diagnosis not present

## 2011-08-10 DIAGNOSIS — J45909 Unspecified asthma, uncomplicated: Secondary | ICD-10-CM | POA: Diagnosis not present

## 2011-08-10 DIAGNOSIS — Z0000 Encounter for general adult medical examination without abnormal findings: Secondary | ICD-10-CM | POA: Diagnosis not present

## 2011-08-10 DIAGNOSIS — I10 Essential (primary) hypertension: Secondary | ICD-10-CM | POA: Diagnosis not present

## 2011-08-10 DIAGNOSIS — I2510 Atherosclerotic heart disease of native coronary artery without angina pectoris: Secondary | ICD-10-CM | POA: Diagnosis not present

## 2011-08-13 DIAGNOSIS — I4891 Unspecified atrial fibrillation: Secondary | ICD-10-CM | POA: Diagnosis not present

## 2011-08-13 DIAGNOSIS — Z7901 Long term (current) use of anticoagulants: Secondary | ICD-10-CM | POA: Diagnosis not present

## 2011-08-18 DIAGNOSIS — Z1231 Encounter for screening mammogram for malignant neoplasm of breast: Secondary | ICD-10-CM | POA: Diagnosis not present

## 2011-08-27 DIAGNOSIS — Z7901 Long term (current) use of anticoagulants: Secondary | ICD-10-CM | POA: Diagnosis not present

## 2011-08-27 DIAGNOSIS — I4891 Unspecified atrial fibrillation: Secondary | ICD-10-CM | POA: Diagnosis not present

## 2011-08-31 DIAGNOSIS — G4733 Obstructive sleep apnea (adult) (pediatric): Secondary | ICD-10-CM | POA: Diagnosis not present

## 2011-08-31 DIAGNOSIS — I509 Heart failure, unspecified: Secondary | ICD-10-CM | POA: Diagnosis not present

## 2011-09-15 DIAGNOSIS — Z0142 Encounter for cervical smear to confirm findings of recent normal smear following initial abnormal smear: Secondary | ICD-10-CM | POA: Diagnosis not present

## 2011-09-15 DIAGNOSIS — N952 Postmenopausal atrophic vaginitis: Secondary | ICD-10-CM | POA: Diagnosis not present

## 2011-09-15 DIAGNOSIS — IMO0002 Reserved for concepts with insufficient information to code with codable children: Secondary | ICD-10-CM | POA: Diagnosis not present

## 2011-09-15 DIAGNOSIS — Q820 Hereditary lymphedema: Secondary | ICD-10-CM | POA: Diagnosis not present

## 2011-09-24 DIAGNOSIS — Z7901 Long term (current) use of anticoagulants: Secondary | ICD-10-CM | POA: Diagnosis not present

## 2011-09-24 DIAGNOSIS — I4891 Unspecified atrial fibrillation: Secondary | ICD-10-CM | POA: Diagnosis not present

## 2011-09-25 DIAGNOSIS — G4733 Obstructive sleep apnea (adult) (pediatric): Secondary | ICD-10-CM | POA: Diagnosis not present

## 2011-09-25 DIAGNOSIS — G4731 Primary central sleep apnea: Secondary | ICD-10-CM | POA: Diagnosis not present

## 2011-09-25 DIAGNOSIS — I509 Heart failure, unspecified: Secondary | ICD-10-CM | POA: Diagnosis not present

## 2011-10-05 DIAGNOSIS — Z7901 Long term (current) use of anticoagulants: Secondary | ICD-10-CM | POA: Diagnosis not present

## 2011-10-05 DIAGNOSIS — I509 Heart failure, unspecified: Secondary | ICD-10-CM | POA: Diagnosis not present

## 2011-10-22 DIAGNOSIS — Z7901 Long term (current) use of anticoagulants: Secondary | ICD-10-CM | POA: Diagnosis not present

## 2011-10-22 DIAGNOSIS — I4891 Unspecified atrial fibrillation: Secondary | ICD-10-CM | POA: Diagnosis not present

## 2011-11-16 DIAGNOSIS — Z7901 Long term (current) use of anticoagulants: Secondary | ICD-10-CM | POA: Diagnosis not present

## 2011-11-16 DIAGNOSIS — I4891 Unspecified atrial fibrillation: Secondary | ICD-10-CM | POA: Diagnosis not present

## 2011-11-27 DIAGNOSIS — Z23 Encounter for immunization: Secondary | ICD-10-CM | POA: Diagnosis not present

## 2011-12-17 DIAGNOSIS — I4891 Unspecified atrial fibrillation: Secondary | ICD-10-CM | POA: Diagnosis not present

## 2011-12-17 DIAGNOSIS — Z7901 Long term (current) use of anticoagulants: Secondary | ICD-10-CM | POA: Diagnosis not present

## 2011-12-29 DIAGNOSIS — H04129 Dry eye syndrome of unspecified lacrimal gland: Secondary | ICD-10-CM | POA: Diagnosis not present

## 2012-01-05 DIAGNOSIS — Z7901 Long term (current) use of anticoagulants: Secondary | ICD-10-CM | POA: Diagnosis not present

## 2012-01-05 DIAGNOSIS — I4891 Unspecified atrial fibrillation: Secondary | ICD-10-CM | POA: Diagnosis not present

## 2012-02-02 DIAGNOSIS — Z7901 Long term (current) use of anticoagulants: Secondary | ICD-10-CM | POA: Diagnosis not present

## 2012-02-02 DIAGNOSIS — I4891 Unspecified atrial fibrillation: Secondary | ICD-10-CM | POA: Diagnosis not present

## 2012-03-01 DIAGNOSIS — Z7901 Long term (current) use of anticoagulants: Secondary | ICD-10-CM | POA: Diagnosis not present

## 2012-03-01 DIAGNOSIS — I4891 Unspecified atrial fibrillation: Secondary | ICD-10-CM | POA: Diagnosis not present

## 2012-03-15 DIAGNOSIS — J45909 Unspecified asthma, uncomplicated: Secondary | ICD-10-CM | POA: Diagnosis not present

## 2012-03-15 DIAGNOSIS — I10 Essential (primary) hypertension: Secondary | ICD-10-CM | POA: Diagnosis not present

## 2012-03-15 DIAGNOSIS — E785 Hyperlipidemia, unspecified: Secondary | ICD-10-CM | POA: Diagnosis not present

## 2012-03-15 DIAGNOSIS — R634 Abnormal weight loss: Secondary | ICD-10-CM | POA: Diagnosis not present

## 2012-03-22 DIAGNOSIS — G4733 Obstructive sleep apnea (adult) (pediatric): Secondary | ICD-10-CM | POA: Diagnosis not present

## 2012-03-22 DIAGNOSIS — R609 Edema, unspecified: Secondary | ICD-10-CM | POA: Diagnosis not present

## 2012-03-22 DIAGNOSIS — Z7901 Long term (current) use of anticoagulants: Secondary | ICD-10-CM | POA: Diagnosis not present

## 2012-03-22 DIAGNOSIS — I4891 Unspecified atrial fibrillation: Secondary | ICD-10-CM | POA: Diagnosis not present

## 2012-03-22 DIAGNOSIS — E039 Hypothyroidism, unspecified: Secondary | ICD-10-CM | POA: Diagnosis not present

## 2012-04-04 DIAGNOSIS — R011 Cardiac murmur, unspecified: Secondary | ICD-10-CM | POA: Diagnosis not present

## 2012-04-04 DIAGNOSIS — I2510 Atherosclerotic heart disease of native coronary artery without angina pectoris: Secondary | ICD-10-CM | POA: Diagnosis not present

## 2012-04-04 DIAGNOSIS — E782 Mixed hyperlipidemia: Secondary | ICD-10-CM | POA: Diagnosis not present

## 2012-04-06 DIAGNOSIS — Z7901 Long term (current) use of anticoagulants: Secondary | ICD-10-CM | POA: Diagnosis not present

## 2012-04-06 DIAGNOSIS — I4891 Unspecified atrial fibrillation: Secondary | ICD-10-CM | POA: Diagnosis not present

## 2012-04-12 DIAGNOSIS — I959 Hypotension, unspecified: Secondary | ICD-10-CM | POA: Diagnosis not present

## 2012-05-02 ENCOUNTER — Ambulatory Visit: Payer: Self-pay | Admitting: Cardiovascular Disease

## 2012-05-02 DIAGNOSIS — Z7901 Long term (current) use of anticoagulants: Secondary | ICD-10-CM

## 2012-05-04 DIAGNOSIS — H26499 Other secondary cataract, unspecified eye: Secondary | ICD-10-CM | POA: Diagnosis not present

## 2012-05-06 ENCOUNTER — Emergency Department (HOSPITAL_COMMUNITY): Payer: Medicare Other

## 2012-05-06 ENCOUNTER — Encounter (HOSPITAL_COMMUNITY): Payer: Self-pay | Admitting: Emergency Medicine

## 2012-05-06 ENCOUNTER — Emergency Department (HOSPITAL_COMMUNITY)
Admission: EM | Admit: 2012-05-06 | Discharge: 2012-05-06 | Disposition: A | Discharge status: Home / Self Care | Payer: Medicare Other | Attending: Emergency Medicine | Admitting: Emergency Medicine

## 2012-05-06 DIAGNOSIS — S0003XA Contusion of scalp, initial encounter: Secondary | ICD-10-CM | POA: Diagnosis not present

## 2012-05-06 DIAGNOSIS — IMO0002 Reserved for concepts with insufficient information to code with codable children: Secondary | ICD-10-CM | POA: Insufficient documentation

## 2012-05-06 DIAGNOSIS — Z79899 Other long term (current) drug therapy: Secondary | ICD-10-CM | POA: Diagnosis not present

## 2012-05-06 DIAGNOSIS — Y9389 Activity, other specified: Secondary | ICD-10-CM | POA: Insufficient documentation

## 2012-05-06 DIAGNOSIS — Y929 Unspecified place or not applicable: Secondary | ICD-10-CM | POA: Insufficient documentation

## 2012-05-06 DIAGNOSIS — S199XXA Unspecified injury of neck, initial encounter: Secondary | ICD-10-CM | POA: Diagnosis not present

## 2012-05-06 DIAGNOSIS — M7989 Other specified soft tissue disorders: Secondary | ICD-10-CM | POA: Diagnosis not present

## 2012-05-06 DIAGNOSIS — T148XXA Other injury of unspecified body region, initial encounter: Secondary | ICD-10-CM

## 2012-05-06 DIAGNOSIS — R51 Headache: Secondary | ICD-10-CM | POA: Diagnosis not present

## 2012-05-06 DIAGNOSIS — Z7982 Long term (current) use of aspirin: Secondary | ICD-10-CM | POA: Diagnosis not present

## 2012-05-06 DIAGNOSIS — W19XXXA Unspecified fall, initial encounter: Secondary | ICD-10-CM

## 2012-05-06 DIAGNOSIS — M79609 Pain in unspecified limb: Secondary | ICD-10-CM | POA: Diagnosis not present

## 2012-05-06 DIAGNOSIS — W1809XA Striking against other object with subsequent fall, initial encounter: Secondary | ICD-10-CM | POA: Insufficient documentation

## 2012-05-06 DIAGNOSIS — S064X9A Epidural hemorrhage with loss of consciousness of unspecified duration, initial encounter: Secondary | ICD-10-CM | POA: Diagnosis not present

## 2012-05-06 DIAGNOSIS — S0083XA Contusion of other part of head, initial encounter: Secondary | ICD-10-CM | POA: Diagnosis not present

## 2012-05-06 DIAGNOSIS — S0993XA Unspecified injury of face, initial encounter: Secondary | ICD-10-CM | POA: Diagnosis not present

## 2012-05-06 DIAGNOSIS — S1093XA Contusion of unspecified part of neck, initial encounter: Secondary | ICD-10-CM | POA: Diagnosis not present

## 2012-05-06 DIAGNOSIS — T1490XA Injury, unspecified, initial encounter: Secondary | ICD-10-CM | POA: Diagnosis not present

## 2012-05-06 LAB — POCT I-STAT, CHEM 8
Creatinine, Ser: 1 mg/dL (ref 0.50–1.10)
Glucose, Bld: 93 mg/dL (ref 70–99)
Hemoglobin: 11.6 g/dL — ABNORMAL LOW (ref 12.0–15.0)
TCO2: 29 mmol/L (ref 0–100)

## 2012-05-06 LAB — CBC WITH DIFFERENTIAL/PLATELET
Basophils Absolute: 0 10*3/uL (ref 0.0–0.1)
Basophils Relative: 1 % (ref 0–1)
Eosinophils Relative: 4 % (ref 0–5)
HCT: 32 % — ABNORMAL LOW (ref 36.0–46.0)
MCHC: 32.5 g/dL (ref 30.0–36.0)
MCV: 93.3 fL (ref 78.0–100.0)
Monocytes Absolute: 0.5 10*3/uL (ref 0.1–1.0)
Neutro Abs: 2.5 10*3/uL (ref 1.7–7.7)
RDW: 14.6 % (ref 11.5–15.5)

## 2012-05-06 MED ORDER — FENTANYL CITRATE 0.05 MG/ML IJ SOLN
50.0000 ug | Freq: Once | INTRAMUSCULAR | Status: AC
  Administered 2012-05-06: 07:00:00 via INTRAVENOUS
  Filled 2012-05-06: qty 2

## 2012-05-06 MED ORDER — ONDANSETRON HCL 4 MG/2ML IJ SOLN
4.0000 mg | Freq: Once | INTRAMUSCULAR | Status: AC
  Administered 2012-05-06: 4 mg via INTRAVENOUS
  Filled 2012-05-06: qty 2

## 2012-05-06 NOTE — ED Notes (Signed)
[id number]<YN> Expected date:<BR> Expected time:<BR> Means of arrival:<BR> Comments:<BR> EMS/78 yo female-fell from bed

## 2012-05-06 NOTE — ED Notes (Signed)
Report given via EMS. Pt c/o fall from bed 30 minutes prior. Pt was advised by nurse via coumadin clinic that if she ever hit her head she had to come to the hospital. VS 158/96 HR 68 CBG 87 RR 18 at 0415. Hematoma to head. AAOx4. Skin tear posterior left heel. Hx A fib, Millroys.

## 2012-05-06 NOTE — ED Provider Notes (Signed)
History     CSN: [id number]  Arrival date & time 05/06/12  0434   First MD Initiated Contact with Patient 05/06/12 0441      Chief Complaint  Patient presents with  . Fall    (Consider location/radiation/quality/duration/timing/severity/associated sxs/prior treatment) HPI Hx per PT - takes coumadin for afib, getting out of bed this am, fell striking her forehead, questionable brief LOC, no neck pain, she also sustained abrasion to her L heel but denies any sig injury to it. No weakness or numbness, no N/V, she has moderate pain and swelling to her forehead, pain is sharp and not radiating.  No neck pain History reviewed. No pertinent past medical history.  History reviewed. No pertinent past surgical history.  No family history on file.  History  Substance Use Topics  . Smoking status: Not on file  . Smokeless tobacco: Not on file  . Alcohol Use: Not on file    OB History   Grav Para Term Preterm Abortions TAB SAB Ect Mult Living                  Review of Systems  Constitutional: Negative for fever and chills.  HENT: Negative for neck pain and neck stiffness.   Eyes: Negative for pain.  Respiratory: Negative for shortness of breath.   Cardiovascular: Negative for chest pain.  Gastrointestinal: Negative for abdominal pain.  Genitourinary: Negative for dysuria.  Musculoskeletal: Negative for back pain.  Skin: Positive for wound. Negative for rash.  Neurological: Negative for headaches.  All other systems reviewed and are negative.    Allergies  Codeine; Latex; Penicillins; and Sulfonamide derivatives  Home Medications  No current outpatient prescriptions on file.  BP 145/65  Pulse 66  Temp(Src) 98 F (36.7 C) (Oral)  Resp 14  SpO2 93%  Physical Exam  Constitutional: She is oriented to person, place, and time. She appears well-developed and well-nourished.  HENT:  Head: Normocephalic.  Swelling and ecchymosis with tenderness right forehead. No bony  deformity. No lac or bleeding  Eyes: Conjunctivae and EOM are normal. Pupils are equal, round, and reactive to light.  Neck: Full passive range of motion without pain. Neck supple. No thyromegaly present.  No cervical spine tenderness or deformity  Cardiovascular: Normal rate, S1 normal, S2 normal and intact distal pulses.   irreg  Pulmonary/Chest: Effort normal and breath sounds normal. She exhibits no tenderness.  Abdominal: Soft. Bowel sounds are normal. There is no tenderness. There is no CVA tenderness.  Musculoskeletal: Normal range of motion.  Mild abrasion left heel, no bony tenderness or deformity  Neurological: She is alert and oriented to person, place, and time. She has normal strength and normal reflexes. No cranial nerve deficit or sensory deficit. She displays a negative Romberg sign. GCS eye subscore is 4. GCS verbal subscore is 5. GCS motor subscore is 6.  Normal Gait  Skin: Skin is warm and dry. No rash noted. No cyanosis. Nails show no clubbing.  Psychiatric: She has a normal mood and affect. Her speech is normal and behavior is normal.    ED Course  Procedures (including critical care time)   Results for orders placed during the hospital encounter of 05/06/12  CBC WITH DIFFERENTIAL      Result Value Range   WBC 4.7  4.0 - 10.5 K/uL   RBC 3.43 (*) 3.87 - 5.11 MIL/uL   Hemoglobin 10.4 (*) 12.0 - 15.0 g/dL   HCT 40.9 (*) 81.1 - 91.4 %  MCV 93.3  78.0 - 100.0 fL   MCH 30.3  26.0 - 34.0 pg   MCHC 32.5  30.0 - 36.0 g/dL   RDW 40.9  81.1 - 91.4 %   Platelets 146 (*) 150 - 400 K/uL   Neutrophils Relative 54  43 - 77 %   Neutro Abs 2.5  1.7 - 7.7 K/uL   Lymphocytes Relative 31  12 - 46 %   Lymphs Abs 1.4  0.7 - 4.0 K/uL   Monocytes Relative 10  3 - 12 %   Monocytes Absolute 0.5  0.1 - 1.0 K/uL   Eosinophils Relative 4  0 - 5 %   Eosinophils Absolute 0.2  0.0 - 0.7 K/uL   Basophils Relative 1  0 - 1 %   Basophils Absolute 0.0  0.0 - 0.1 K/uL  PROTIME-INR       Result Value Range   Prothrombin Time 28.7 (*) 11.6 - 15.2 seconds   INR 2.88 (*) 0.00 - 1.49  POCT I-STAT, CHEM 8      Result Value Range   Sodium 140  135 - 145 mEq/L   Potassium 4.5  3.5 - 5.1 mEq/L   Chloride 105  96 - 112 mEq/L   BUN 28 (*) 6 - 23 mg/dL   Creatinine, Ser 7.82  0.50 - 1.10 mg/dL   Glucose, Bld 93  70 - 99 mg/dL   Calcium, Ion 9.56 (*) 1.13 - 1.30 mmol/L   TCO2 29  0 - 100 mmol/L   Hemoglobin 11.6 (*) 12.0 - 15.0 g/dL   HCT 21.3 (*) 08.6 - 57.8 %   Ct Head Wo Contrast  05/06/2012  *RADIOLOGY REPORT*  Clinical Data:  Fall.  Hematoma to the head.  Patient on Coumadin.  CT HEAD WITHOUT CONTRAST CT CERVICAL SPINE WITHOUT CONTRAST  Technique:  Multidetector CT imaging of the head and cervical spine was performed following the standard protocol without intravenous contrast.  Multiplanar CT image reconstructions of the cervical spine were also generated.  Comparison:  CT head 03/11/2008.  MRI brain 03/28/2008.  CT HEAD  Findings: Diffuse cerebral atrophy.  Mild ventricular dilatation consistent with central atrophy.  Low attenuation changes in the deep white matter consistent with small vessel ischemia.  No mass effect or midline shift.  No abnormal extra-axial fluid collections.  Gray-white matter junctions are distinct.  Basal cisterns are not effaced.  No evidence of acute intracranial hemorrhage.  Retention cyst in the left maxillary antrum.  Mastoid air cells are not opacified.  Right anterior frontal subcutaneous scalp hematoma.  No underlying skull fractures.  Vascular calcifications.  IMPRESSION: No acute intracranial abnormalities.  Right anterior frontal soft tissue scalp hematoma.  CT CERVICAL SPINE  Findings: Diffuse degenerative changes throughout the cervical spine with narrowed cervical interspaces and endplate hypertrophic changes.  Vertebral sclerosis is likely due to degenerative change. Degenerative changes in the facet joints and C1-C8.  Normal alignment of the  cervical vertebrae and facet joints.  The lateral masses of C1 are symmetrical and the odontoid process is intact. No prevertebral soft tissue swelling.  No vertebral compression. No focal bone lesion or bone destruction.  Bone cortex and trabecular architecture appear intact except for degenerative changes.  Facet joint hypertrophy causes neural foraminal narrowing at multiple levels.  Incidental note of mild dilatation of the esophagus.  Scarring in the lung apices.  Vascular calcifications.  IMPRESSION: Degenerative changes throughout the cervical spine.  No displaced fractures identified.   Original Report Authenticated  By: Burman Nieves, M.D.    Ct Cervical Spine Wo Contrast  05/06/2012  *RADIOLOGY REPORT*  Clinical Data:  Fall.  Hematoma to the head.  Patient on Coumadin.  CT HEAD WITHOUT CONTRAST CT CERVICAL SPINE WITHOUT CONTRAST  Technique:  Multidetector CT imaging of the head and cervical spine was performed following the standard protocol without intravenous contrast.  Multiplanar CT image reconstructions of the cervical spine were also generated.  Comparison:  CT head 03/11/2008.  MRI brain 03/28/2008.  CT HEAD  Findings: Diffuse cerebral atrophy.  Mild ventricular dilatation consistent with central atrophy.  Low attenuation changes in the deep white matter consistent with small vessel ischemia.  No mass effect or midline shift.  No abnormal extra-axial fluid collections.  Gray-white matter junctions are distinct.  Basal cisterns are not effaced.  No evidence of acute intracranial hemorrhage.  Retention cyst in the left maxillary antrum.  Mastoid air cells are not opacified.  Right anterior frontal subcutaneous scalp hematoma.  No underlying skull fractures.  Vascular calcifications.  IMPRESSION: No acute intracranial abnormalities.  Right anterior frontal soft tissue scalp hematoma.  CT CERVICAL SPINE  Findings: Diffuse degenerative changes throughout the cervical spine with narrowed cervical  interspaces and endplate hypertrophic changes.  Vertebral sclerosis is likely due to degenerative change. Degenerative changes in the facet joints and C1-C8.  Normal alignment of the cervical vertebrae and facet joints.  The lateral masses of C1 are symmetrical and the odontoid process is intact. No prevertebral soft tissue swelling.  No vertebral compression. No focal bone lesion or bone destruction.  Bone cortex and trabecular architecture appear intact except for degenerative changes.  Facet joint hypertrophy causes neural foraminal narrowing at multiple levels.  Incidental note of mild dilatation of the esophagus.  Scarring in the lung apices.  Vascular calcifications.  IMPRESSION: Degenerative changes throughout the cervical spine.  No displaced fractures identified.   Original Report Authenticated By: Burman Nieves, M.D.      Date: 05/06/2012  Rate: 68  Rhythm: atrial fibrillation  QRS Axis: normal  Intervals: normal  ST/T Wave abnormalities: nonspecific ST changes  Conduction Disutrbances:none  Narrative Interpretation:   Old EKG Reviewed: unchanged  Ice, IV Fentanyl  6:55 AM PT feeling well and wants to go home, she agrees to strict return precautions - verbalizes understanding there is a small risk for delayed bleeding - will hold coumadin today and call her physician for further recommendations.   MDM  Fall, scalp contusion on coumadin, no ICB on CT scan. Mild heel abrasion no other trauma. Screening ECG. Labs reviewed.   VS and nursing notes reviewed        Sunnie Nielsen, MD 05/06/12 [phone]

## 2012-05-07 ENCOUNTER — Emergency Department (HOSPITAL_COMMUNITY): Payer: Medicare Other

## 2012-05-07 ENCOUNTER — Emergency Department (HOSPITAL_COMMUNITY)
Admission: EM | Admit: 2012-05-07 | Discharge: 2012-05-07 | Disposition: A | Discharge status: Home / Self Care | Payer: Medicare Other | Attending: Emergency Medicine | Admitting: Emergency Medicine

## 2012-05-07 DIAGNOSIS — IMO0002 Reserved for concepts with insufficient information to code with codable children: Secondary | ICD-10-CM | POA: Insufficient documentation

## 2012-05-07 DIAGNOSIS — M7989 Other specified soft tissue disorders: Secondary | ICD-10-CM | POA: Diagnosis not present

## 2012-05-07 DIAGNOSIS — M79609 Pain in unspecified limb: Secondary | ICD-10-CM | POA: Diagnosis not present

## 2012-05-07 DIAGNOSIS — Y929 Unspecified place or not applicable: Secondary | ICD-10-CM | POA: Insufficient documentation

## 2012-05-07 DIAGNOSIS — R296 Repeated falls: Secondary | ICD-10-CM | POA: Insufficient documentation

## 2012-05-07 DIAGNOSIS — Z7982 Long term (current) use of aspirin: Secondary | ICD-10-CM | POA: Diagnosis not present

## 2012-05-07 DIAGNOSIS — Y939 Activity, unspecified: Secondary | ICD-10-CM | POA: Insufficient documentation

## 2012-05-07 DIAGNOSIS — S8010XA Contusion of unspecified lower leg, initial encounter: Secondary | ICD-10-CM | POA: Diagnosis not present

## 2012-05-07 LAB — CBC WITH DIFFERENTIAL/PLATELET
Basophils Relative: 0 % (ref 0–1)
Eosinophils Absolute: 0.1 10*3/uL (ref 0.0–0.7)
Eosinophils Relative: 2 % (ref 0–5)
Hemoglobin: 10.5 g/dL — ABNORMAL LOW (ref 12.0–15.0)
Lymphs Abs: 1.4 10*3/uL (ref 0.7–4.0)
MCH: 29.6 pg (ref 26.0–34.0)
MCHC: 31.8 g/dL (ref 30.0–36.0)
MCV: 93 fL (ref 78.0–100.0)
Monocytes Relative: 8 % (ref 3–12)
Neutrophils Relative %: 73 % (ref 43–77)
RBC: 3.55 MIL/uL — ABNORMAL LOW (ref 3.87–5.11)

## 2012-05-07 LAB — PROTIME-INR: INR: 3.28 — ABNORMAL HIGH (ref 0.00–1.49)

## 2012-05-07 NOTE — ED Notes (Signed)
Pt fell yesterday early and was seen here for fall,  She has numerous bruising and a small cut on back of left heel and her left leg is now swollen,  Red and warm to touch.  Pt is alert and oriented in NAD.

## 2012-05-07 NOTE — ED Notes (Signed)
Patient states she was seen for a fall early yesterday morning. Today she developed redness and swelling to left lower leg. Patient states it is painful with ambulation. Denies pain while laying in bed. Dr Radford Pax at bedside.

## 2012-05-07 NOTE — Discharge Instructions (Signed)
Hold your Coumadin for the next 4-7 days until the swelling or leg has improved dramatically.  Please contact her cardiologist after this time and discuss returning back on Coumadin long-term.

## 2012-05-10 DIAGNOSIS — R002 Palpitations: Secondary | ICD-10-CM | POA: Diagnosis not present

## 2012-05-10 DIAGNOSIS — R42 Dizziness and giddiness: Secondary | ICD-10-CM | POA: Diagnosis not present

## 2012-05-10 DIAGNOSIS — R609 Edema, unspecified: Secondary | ICD-10-CM | POA: Diagnosis not present

## 2012-05-10 DIAGNOSIS — I4891 Unspecified atrial fibrillation: Secondary | ICD-10-CM | POA: Diagnosis not present

## 2012-05-11 ENCOUNTER — Ambulatory Visit (HOSPITAL_COMMUNITY)
Admission: RE | Admit: 2012-05-11 | Discharge: 2012-05-11 | Disposition: A | Discharge status: Home / Self Care | Payer: Medicare Other | Source: Ambulatory Visit | Attending: Cardiology | Admitting: Cardiology

## 2012-05-11 ENCOUNTER — Other Ambulatory Visit (HOSPITAL_COMMUNITY): Payer: Self-pay | Admitting: Cardiology

## 2012-05-11 DIAGNOSIS — I4891 Unspecified atrial fibrillation: Secondary | ICD-10-CM | POA: Insufficient documentation

## 2012-05-11 DIAGNOSIS — R609 Edema, unspecified: Secondary | ICD-10-CM

## 2012-05-11 DIAGNOSIS — Z7901 Long term (current) use of anticoagulants: Secondary | ICD-10-CM | POA: Diagnosis not present

## 2012-05-11 DIAGNOSIS — M7989 Other specified soft tissue disorders: Secondary | ICD-10-CM | POA: Diagnosis not present

## 2012-05-11 NOTE — Progress Notes (Signed)
Left Lower Ext Venous Duplex Completed. Chauncy Lean

## 2012-05-14 DIAGNOSIS — I4891 Unspecified atrial fibrillation: Secondary | ICD-10-CM | POA: Diagnosis not present

## 2012-05-14 DIAGNOSIS — S0083XA Contusion of other part of head, initial encounter: Secondary | ICD-10-CM | POA: Diagnosis not present

## 2012-05-14 DIAGNOSIS — S0003XA Contusion of scalp, initial encounter: Secondary | ICD-10-CM | POA: Diagnosis not present

## 2012-05-15 NOTE — ED Provider Notes (Signed)
History     CSN: [id number]  Arrival date & time 05/07/12  1850   First MD Initiated Contact with Patient 05/07/12 1955      Chief Complaint  Patient presents with  . Leg Swelling     HPI Pt fell yesterday early and was seen here for fall, She has numerous bruising and a small cut on back of left heel and her left leg is now swollen, Red and warm to touch. Pt is alert and oriented in NAD  No past medical history on file.  No past surgical history on file.  No family history on file.  History  Substance Use Topics  . Smoking status: Not on file  . Smokeless tobacco: Not on file  . Alcohol Use: Not on file    OB History   Grav Para Term Preterm Abortions TAB SAB Ect Mult Living                  Review of Systems  All other systems reviewed and are negative.    Allergies  Codeine; Ciprofloxacin; Latex; Penicillins; and Sulfonamide derivatives  Home Medications   Current Outpatient Rx  Name  Route  Sig  Dispense  Refill  . aspirin EC 81 MG tablet   Oral   Take 81 mg by mouth every morning.         . hydrocortisone cream 0.5 %   Topical   Apply 1 application topically 2 (two) times daily as needed (for itchiness).         . isosorbide dinitrate (ISORDIL) 10 MG tablet   Oral   Take 10 mg by mouth 2 (two) times daily.         [name] levalbuterol (XOPENEX HFA) 45 MCG/ACT inhaler   Inhalation   Inhale 1-2 puffs into the lungs every 4 (four) hours as needed for wheezing or shortness of breath.         . levothyroxine (SYNTHROID, LEVOTHROID) 25 MCG tablet   Oral   Take 25 mcg by mouth every morning.         [name] lisinopril (PRINIVIL,ZESTRIL) 5 MG tablet   Oral   Take 5 mg by mouth every morning.         . metoprolol succinate (TOPROL-XL) 50 MG 24 hr tablet   Oral   Take 50 mg by mouth every morning. Take with or immediately following a meal.         . mometasone (NASONEX) 50 MCG/ACT nasal spray   Nasal   Place 2 sprays into the nose daily as  needed (for allergies).         . morphine (MS CONTIN) 60 MG 12 hr tablet   Oral   Take 60 mg by mouth 2 (two) times daily.         [name] morphine (MSIR) 30 MG tablet   Oral   Take 30 mg by mouth daily as needed for pain.         . nitroGLYCERIN (NITROLINGUAL) 0.4 MG/SPRAY spray   Sublingual   Place 1 spray under the tongue every 5 (five) minutes as needed for chest pain.         [name] sertraline (ZOLOFT) 100 MG tablet   Oral   Take 50 mg by mouth daily.         [name] zolpidem (AMBIEN CR) 6.25 MG CR tablet   Oral   Take 3.125-6.25 mg by mouth at bedtime as needed for sleep.  BP 111/57  Pulse 79  Temp(Src) 98.3 F (36.8 C) (Oral)  Resp 18  SpO2 95%  Physical Exam  Cardiovascular:  Pulses:      Popliteal pulses are 2+ on the right side, and 2+ on the left side.       Dorsalis pedis pulses are 2+ on the right side, and 2+ on the left side.       Posterior tibial pulses are 2+ on the right side, and 2+ on the left side.  Cpa refill good.  No signs of compartment syndrome  Musculoskeletal:       Legs:   ED Course  Procedures (including critical care time)  Labs Reviewed  CBC WITH DIFFERENTIAL - Abnormal; Notable for the following:    RBC 3.55 (*)    Hemoglobin 10.5 (*)    HCT 33.0 (*)    Platelets 138 (*)    All other components within normal limits  PROTIME-INR - Abnormal; Notable for the following:    Prothrombin Time 31.6 (*)    INR 3.28 (*)    All other components within normal limits   No results found.   1. Hematoma - postoperative, sequela       MDM  Instructed on signs and symptoms of compartment syndrome.  Will hold coumadin for next few days.   Leg xraysed and no underlying fx. Seen.       Nelia Shi, MD 05/15/12 [phone]

## 2012-05-18 DIAGNOSIS — I4891 Unspecified atrial fibrillation: Secondary | ICD-10-CM | POA: Diagnosis not present

## 2012-05-18 DIAGNOSIS — Z7901 Long term (current) use of anticoagulants: Secondary | ICD-10-CM | POA: Diagnosis not present

## 2012-06-01 DIAGNOSIS — I4891 Unspecified atrial fibrillation: Secondary | ICD-10-CM | POA: Diagnosis not present

## 2012-06-01 DIAGNOSIS — Z7901 Long term (current) use of anticoagulants: Secondary | ICD-10-CM | POA: Diagnosis not present

## 2012-06-24 DIAGNOSIS — Z7901 Long term (current) use of anticoagulants: Secondary | ICD-10-CM | POA: Diagnosis not present

## 2012-06-24 DIAGNOSIS — I4891 Unspecified atrial fibrillation: Secondary | ICD-10-CM | POA: Diagnosis not present

## 2012-07-04 ENCOUNTER — Ambulatory Visit (INDEPENDENT_AMBULATORY_CARE_PROVIDER_SITE_OTHER): Payer: Medicare Other | Admitting: Pharmacist Clinician (PhC)/ Clinical Pharmacy Specialist

## 2012-07-04 VITALS — BP 112/60 | HR 76

## 2012-07-04 DIAGNOSIS — I4891 Unspecified atrial fibrillation: Secondary | ICD-10-CM

## 2012-07-04 DIAGNOSIS — Z7901 Long term (current) use of anticoagulants: Secondary | ICD-10-CM

## 2012-07-04 MED ORDER — METOPROLOL SUCCINATE ER 50 MG PO TB24: 50.0000 mg | ORAL_TABLET | Freq: Every morning | ORAL | Status: DC

## 2012-07-05 ENCOUNTER — Telehealth: Payer: Self-pay | Admitting: Cardiovascular Disease

## 2012-07-05 NOTE — Telephone Encounter (Signed)
Message forwarded to K. Alvstad, PharmD.  

## 2012-07-05 NOTE — Telephone Encounter (Signed)
Need her last INR-Had it yesterday! Scheduled for surgery tomorrow!k

## 2012-07-06 NOTE — Telephone Encounter (Signed)
Will fax INR form 5/19 to their office - pt to have dental work this week

## 2012-08-04 ENCOUNTER — Ambulatory Visit (INDEPENDENT_AMBULATORY_CARE_PROVIDER_SITE_OTHER): Payer: Medicare Other | Admitting: Pharmacist Clinician (PhC)/ Clinical Pharmacy Specialist

## 2012-08-04 ENCOUNTER — Other Ambulatory Visit: Payer: Self-pay | Admitting: Pharmacist Clinician (PhC)/ Clinical Pharmacy Specialist

## 2012-08-04 DIAGNOSIS — I4891 Unspecified atrial fibrillation: Secondary | ICD-10-CM

## 2012-08-04 DIAGNOSIS — Z7901 Long term (current) use of anticoagulants: Secondary | ICD-10-CM | POA: Diagnosis not present

## 2012-08-04 MED ORDER — TOPROL XL 50 MG PO TB24: ORAL_TABLET | ORAL | Status: DC

## 2012-08-06 ENCOUNTER — Emergency Department (HOSPITAL_COMMUNITY): Payer: Medicare Other

## 2012-08-06 ENCOUNTER — Emergency Department (HOSPITAL_COMMUNITY)
Admission: EM | Admit: 2012-08-06 | Discharge: 2012-08-06 | Disposition: A | Discharge status: Home / Self Care | Payer: Medicare Other | Attending: Emergency Medicine | Admitting: Emergency Medicine

## 2012-08-06 ENCOUNTER — Encounter (HOSPITAL_COMMUNITY): Payer: Self-pay | Admitting: Emergency Medicine

## 2012-08-06 DIAGNOSIS — S0993XA Unspecified injury of face, initial encounter: Secondary | ICD-10-CM | POA: Diagnosis not present

## 2012-08-06 DIAGNOSIS — S99919A Unspecified injury of unspecified ankle, initial encounter: Secondary | ICD-10-CM | POA: Diagnosis not present

## 2012-08-06 DIAGNOSIS — Z7982 Long term (current) use of aspirin: Secondary | ICD-10-CM | POA: Diagnosis not present

## 2012-08-06 DIAGNOSIS — W19XXXA Unspecified fall, initial encounter: Secondary | ICD-10-CM

## 2012-08-06 DIAGNOSIS — I10 Essential (primary) hypertension: Secondary | ICD-10-CM | POA: Diagnosis not present

## 2012-08-06 DIAGNOSIS — S0083XA Contusion of other part of head, initial encounter: Secondary | ICD-10-CM | POA: Diagnosis not present

## 2012-08-06 DIAGNOSIS — Z91040 Latex allergy status: Secondary | ICD-10-CM | POA: Insufficient documentation

## 2012-08-06 DIAGNOSIS — S8990XA Unspecified injury of unspecified lower leg, initial encounter: Secondary | ICD-10-CM | POA: Diagnosis not present

## 2012-08-06 DIAGNOSIS — S8992XA Unspecified injury of left lower leg, initial encounter: Secondary | ICD-10-CM

## 2012-08-06 DIAGNOSIS — Z79899 Other long term (current) drug therapy: Secondary | ICD-10-CM | POA: Insufficient documentation

## 2012-08-06 DIAGNOSIS — F411 Generalized anxiety disorder: Secondary | ICD-10-CM | POA: Diagnosis not present

## 2012-08-06 DIAGNOSIS — S0003XA Contusion of scalp, initial encounter: Secondary | ICD-10-CM | POA: Diagnosis not present

## 2012-08-06 DIAGNOSIS — W010XXA Fall on same level from slipping, tripping and stumbling without subsequent striking against object, initial encounter: Secondary | ICD-10-CM | POA: Insufficient documentation

## 2012-08-06 DIAGNOSIS — Z880 Allergy status to penicillin: Secondary | ICD-10-CM | POA: Insufficient documentation

## 2012-08-06 DIAGNOSIS — S99929A Unspecified injury of unspecified foot, initial encounter: Secondary | ICD-10-CM | POA: Diagnosis not present

## 2012-08-06 DIAGNOSIS — M25569 Pain in unspecified knee: Secondary | ICD-10-CM | POA: Diagnosis not present

## 2012-08-06 DIAGNOSIS — Z8679 Personal history of other diseases of the circulatory system: Secondary | ICD-10-CM | POA: Diagnosis not present

## 2012-08-06 DIAGNOSIS — Y9389 Activity, other specified: Secondary | ICD-10-CM | POA: Insufficient documentation

## 2012-08-06 DIAGNOSIS — Y92009 Unspecified place in unspecified non-institutional (private) residence as the place of occurrence of the external cause: Secondary | ICD-10-CM | POA: Insufficient documentation

## 2012-08-06 HISTORY — DX: Anxiety disorder, unspecified: F41.9

## 2012-08-06 HISTORY — DX: Essential (primary) hypertension: I10

## 2012-08-06 LAB — PROTIME-INR
INR: 2.08 — ABNORMAL HIGH (ref 0.00–1.49)
Prothrombin Time: 22.5 seconds — ABNORMAL HIGH (ref 11.6–15.2)

## 2012-08-06 NOTE — ED Notes (Addendum)
Patient states she tripped while she was working in the yard and hit her head on a gate. Swelling noted to left head.  Patient is A/O x 4 on arrival.  No deficits noted at this time.  Patient has a h/o Afib and is currently taking Coumadin.  Abrasion noted to left knee.

## 2012-08-06 NOTE — ED Notes (Signed)
Patient transported to X-ray 

## 2012-08-06 NOTE — ED Provider Notes (Signed)
History     CSN: [id number]  Arrival date & time 08/06/12  1109   First MD Initiated Contact with Patient 08/06/12 1120      Chief Complaint  Patient presents with  . Head Injury  . Fall    (Consider location/radiation/quality/duration/timing/severity/associated sxs/prior treatment) HPI Comments: Patient is a 78 year old female with a past medical history of hypertension and atrial fibrillation who presents after a mechanical fall while working in her yard prior to arrival. Patient reports tripping in her yard and hitting her head on a gate and also landing on her left knee. She reports immediate onset of pain. She denies LOC, nausea, vomiting. The pain in her head and knee is throbbing and moderate without radiation. Weight bearing activity makes the knee pain worse. Nothing makes the pain better. Patient's PCP recommended she come to the hospital because she takes Coumadin for atrial fibrillation. No other injuries.   Patient is a 78 y.o. female presenting with head injury and fall.  Head Injury Associated symptoms: headache   Fall Associated symptoms include arthralgias and headaches.    Past Medical History  Diagnosis Date  . Hypertension   . A-fib   . Anxiety     History reviewed. No pertinent past surgical history.  History reviewed. No pertinent family history.  History  Substance Use Topics  . Smoking status: Never Smoker   . Smokeless tobacco: Not on file  . Alcohol Use: No    OB History   Grav Para Term Preterm Abortions TAB SAB Ect Mult Living                  Review of Systems  Musculoskeletal: Positive for arthralgias.  Neurological: Positive for headaches.  All other systems reviewed and are negative.    Allergies  Codeine; Ciprofloxacin; Latex; Penicillins; and Sulfonamide derivatives  Home Medications   Current Outpatient Rx  Name  Route  Sig  Dispense  Refill  . aspirin EC 81 MG tablet   Oral   Take 81 mg by mouth every morning.          . hydrocortisone cream 0.5 %   Topical   Apply 1 application topically 2 (two) times daily as needed (for itchiness).         . isosorbide dinitrate (ISORDIL) 10 MG tablet   Oral   Take 10 mg by mouth 2 (two) times daily.         [name] levalbuterol (XOPENEX HFA) 45 MCG/ACT inhaler   Inhalation   Inhale 1-2 puffs into the lungs every 4 (four) hours as needed for wheezing or shortness of breath.         . levothyroxine (SYNTHROID, LEVOTHROID) 25 MCG tablet   Oral   Take 25 mcg by mouth every morning.         [name] lisinopril (PRINIVIL,ZESTRIL) 5 MG tablet   Oral   Take 5 mg by mouth every morning.         . mometasone (NASONEX) 50 MCG/ACT nasal spray   Nasal   Place 2 sprays into the nose daily as needed (for allergies).         . morphine (MS CONTIN) 60 MG 12 hr tablet   Oral   Take 60 mg by mouth 2 (two) times daily.         [name] morphine (MSIR) 30 MG tablet   Oral   Take 30 mg by mouth daily as needed for pain.         [name]  nitroGLYCERIN (NITROLINGUAL) 0.4 MG/SPRAY spray   Sublingual   Place 1 spray under the tongue every 5 (five) minutes as needed for chest pain.         [name] sertraline (ZOLOFT) 100 MG tablet   Oral   Take 50 mg by mouth daily.         . TOPROL XL 50 MG 24 hr tablet      Take 1 tablet by mouth daily, with or immediately following a meal.   30 tablet   6     Dispense as written.    Brand name Toprol XL only.   [name] zolpidem (AMBIEN CR) 6.25 MG CR tablet   Oral   Take 3.125-6.25 mg by mouth at bedtime as needed for sleep.           BP 112/54  Pulse 79  Temp(Src) 98.6 F (37 C) (Oral)  Resp 16  SpO2 91%  Physical Exam  Nursing note and vitals reviewed. Constitutional: She is oriented to person, place, and time. She appears well-developed and well-nourished. No distress.  HENT:  Head: Normocephalic and atraumatic.  Mouth/Throat: Oropharynx is clear and moist. No oropharyngeal exudate.  Left temporal scalp localized swelling and  tenderness to palpation. No open wound.  Eyes: Conjunctivae and EOM are normal. Pupils are equal, round, and reactive to light. No scleral icterus.  Neck: Normal range of motion. Neck supple.  Cardiovascular: Normal rate and regular rhythm.  Exam reveals no gallop and no friction rub.   No murmur heard. Pulmonary/Chest: Effort normal and breath sounds normal. She has no wheezes. She has no rales. She exhibits no tenderness.  Abdominal: Soft. She exhibits no distension. There is no tenderness. There is no rebound and no guarding.  Musculoskeletal: Normal range of motion.  Full ROM of left knee. No tenderness to palpation.   Neurological: She is alert and oriented to person, place, and time. Coordination normal.  Speech is goal-oriented. Moves limbs without ataxia.   Skin: Skin is warm and dry.  Abrasion noted to left knee.   Psychiatric: She has a normal mood and affect. Her behavior is normal.    ED Course  Procedures (including critical care time)  Labs Reviewed  PROTIME-INR - Abnormal; Notable for the following:    Prothrombin Time 22.5 (*)    INR 2.08 (*)    All other components within normal limits   Ct Head Wo Contrast  08/06/2012   *RADIOLOGY REPORT*  Clinical Data:  Fall.  Struck head.  Patient on anticoagulation.  CT HEAD WITHOUT CONTRAST CT CERVICAL SPINE WITHOUT CONTRAST  Technique:  Multidetector CT imaging of the head and cervical spine was performed following the standard protocol without intravenous contrast.  Multiplanar CT image reconstructions of the cervical spine were also generated.  Comparison:  Head CT 05/06/2012  CT HEAD  Findings: There is a small scalp hematoma over the left parietal bone.  There is no associate skull fracture.  No intracranial hemorrhage.  No parenchymal contusion.  No midline shift or mass effect.  Basilar cisterns are patent. No skull base fracture.  No fluid in the paranasal sinuses or mastoid air cells.  IMPRESSION:  1.  Left scalp hematoma. 2.   No acute intracranial trauma.  CT CERVICAL SPINE  Findings: No prevertebral soft tissue swelling.  Normal alignment of cervical vertebral bodies.  No loss of vertebral body height. Normal facet articulation.  Normal craniocervical junction.  There is multilevel endplate spurring and disc space narrowing. There is  a cystic endplate changes.  No evidence epidural or paraspinal hematoma.  There is enlarged left supraclavicular lymph node lateral to the left thyroid tissue measuring 13 mm (image 57, series 5.). Potential additional lymph nodes superior to the left clavicle measuring 19 mm (image 71).  IMPRESSION: 1.  No evidence of cervical spine fracture.  2.  Multilevel disc osteophytic disease. 3.  Enlarged left supraclavicular lymph node.  In patient with history of colon carcinoma, consider outpatient FDG PET/CT  scan to assess for malignant potential.   Original Report Authenticated By: Genevive Bi, M.D.   Ct Cervical Spine Wo Contrast  08/06/2012   *RADIOLOGY REPORT*  Clinical Data:  Fall.  Struck head.  Patient on anticoagulation.  CT HEAD WITHOUT CONTRAST CT CERVICAL SPINE WITHOUT CONTRAST  Technique:  Multidetector CT imaging of the head and cervical spine was performed following the standard protocol without intravenous contrast.  Multiplanar CT image reconstructions of the cervical spine were also generated.  Comparison:  Head CT 05/06/2012  CT HEAD  Findings: There is a small scalp hematoma over the left parietal bone.  There is no associate skull fracture.  No intracranial hemorrhage.  No parenchymal contusion.  No midline shift or mass effect.  Basilar cisterns are patent. No skull base fracture.  No fluid in the paranasal sinuses or mastoid air cells.  IMPRESSION:  1.  Left scalp hematoma. 2.  No acute intracranial trauma.  CT CERVICAL SPINE  Findings: No prevertebral soft tissue swelling.  Normal alignment of cervical vertebral bodies.  No loss of vertebral body height. Normal facet  articulation.  Normal craniocervical junction.  There is multilevel endplate spurring and disc space narrowing. There is a cystic endplate changes.  No evidence epidural or paraspinal hematoma.  There is enlarged left supraclavicular lymph node lateral to the left thyroid tissue measuring 13 mm (image 57, series 5.). Potential additional lymph nodes superior to the left clavicle measuring 19 mm (image 71).  IMPRESSION: 1.  No evidence of cervical spine fracture.  2.  Multilevel disc osteophytic disease. 3.  Enlarged left supraclavicular lymph node.  In patient with history of colon carcinoma, consider outpatient FDG PET/CT  scan to assess for malignant potential.   Original Report Authenticated By: Genevive Bi, M.D.   Dg Knee Complete 4 Views Left  08/06/2012   *RADIOLOGY REPORT*  Clinical Data: Fall with anterior left knee pain.  LEFT KNEE - COMPLETE 4+ VIEW  Comparison: Tibia and fibula films on 05/07/2012.  Findings: No acute fracture, dislocation or joint effusion is identified.  Severe joint space narrowing is present involving the medial compartment.  There are moderate proliferative changes involving the patellofemoral joint.  Degenerative changes are similar to the recent tibia / fibula radiographs.  No bony lesions are identified.  Soft tissues are unremarkable.  IMPRESSION: No acute fracture.  Stable degenerative changes consisting primarily of significant medial joint space narrowing.   Original Report Authenticated By: Irish Lack, M.D.     1. Fall, initial encounter   2. Scalp hematoma, initial encounter   3. Left knee injury, initial encounter       MDM  11:41 AM CT head and cervical spine and xray of left knee pending. Vitals stable and patient afebrile.   1:25 PM CT head and cervical spine unremarkable for acute change. Supraclavicular lymph node noted on CT cervical spine. Patient notified of this finding. Knee film unremarkable for acute change. Patient's INR pending.    3:04 PM INR 2.08. Patient will be  discharged without further evaluation. Patient instructed to return with worsening or concerning symptoms.      Emilia Beck, PA-C 08/06/12 2001

## 2012-08-07 NOTE — ED Provider Notes (Signed)
Medical screening examination/treatment/procedure(s) were performed by non-physician practitioner and as supervising physician I was immediately available for consultation/collaboration.  Duel Conrad R. Porshea Janowski, MD 08/07/12 1549 

## 2012-08-09 DIAGNOSIS — I4891 Unspecified atrial fibrillation: Secondary | ICD-10-CM | POA: Diagnosis not present

## 2012-08-09 DIAGNOSIS — I10 Essential (primary) hypertension: Secondary | ICD-10-CM | POA: Diagnosis not present

## 2012-08-09 DIAGNOSIS — E785 Hyperlipidemia, unspecified: Secondary | ICD-10-CM | POA: Diagnosis not present

## 2012-08-15 DIAGNOSIS — J45909 Unspecified asthma, uncomplicated: Secondary | ICD-10-CM | POA: Diagnosis not present

## 2012-08-15 DIAGNOSIS — I2510 Atherosclerotic heart disease of native coronary artery without angina pectoris: Secondary | ICD-10-CM | POA: Diagnosis not present

## 2012-08-15 DIAGNOSIS — Q820 Hereditary lymphedema: Secondary | ICD-10-CM | POA: Diagnosis not present

## 2012-08-15 DIAGNOSIS — I10 Essential (primary) hypertension: Secondary | ICD-10-CM | POA: Diagnosis not present

## 2012-08-15 DIAGNOSIS — Z1212 Encounter for screening for malignant neoplasm of rectum: Secondary | ICD-10-CM | POA: Diagnosis not present

## 2012-08-15 DIAGNOSIS — E785 Hyperlipidemia, unspecified: Secondary | ICD-10-CM | POA: Diagnosis not present

## 2012-08-15 DIAGNOSIS — R599 Enlarged lymph nodes, unspecified: Secondary | ICD-10-CM | POA: Diagnosis not present

## 2012-08-15 DIAGNOSIS — Z0000 Encounter for general adult medical examination without abnormal findings: Secondary | ICD-10-CM | POA: Diagnosis not present

## 2012-08-15 DIAGNOSIS — G4733 Obstructive sleep apnea (adult) (pediatric): Secondary | ICD-10-CM | POA: Diagnosis not present

## 2012-08-15 DIAGNOSIS — Z1331 Encounter for screening for depression: Secondary | ICD-10-CM | POA: Diagnosis not present

## 2012-08-18 DIAGNOSIS — Z1231 Encounter for screening mammogram for malignant neoplasm of breast: Secondary | ICD-10-CM | POA: Diagnosis not present

## 2012-08-18 DIAGNOSIS — Z803 Family history of malignant neoplasm of breast: Secondary | ICD-10-CM | POA: Diagnosis not present

## 2012-08-31 ENCOUNTER — Ambulatory Visit (INDEPENDENT_AMBULATORY_CARE_PROVIDER_SITE_OTHER): Payer: Medicare Other | Admitting: Surgery

## 2012-09-01 ENCOUNTER — Other Ambulatory Visit: Payer: Self-pay | Admitting: Cardiovascular Disease

## 2012-09-01 ENCOUNTER — Ambulatory Visit: Payer: Medicare Other | Admitting: Pharmacist Clinician (PhC)/ Clinical Pharmacy Specialist

## 2012-09-07 ENCOUNTER — Ambulatory Visit (INDEPENDENT_AMBULATORY_CARE_PROVIDER_SITE_OTHER): Payer: Medicare Other | Admitting: Pharmacist Clinician (PhC)/ Clinical Pharmacy Specialist

## 2012-09-07 VITALS — BP 128/70 | HR 72

## 2012-09-07 DIAGNOSIS — I4891 Unspecified atrial fibrillation: Secondary | ICD-10-CM | POA: Diagnosis not present

## 2012-09-07 DIAGNOSIS — Z7901 Long term (current) use of anticoagulants: Secondary | ICD-10-CM

## 2012-09-07 LAB — POCT INR: INR: 2.6

## 2012-09-13 ENCOUNTER — Encounter (INDEPENDENT_AMBULATORY_CARE_PROVIDER_SITE_OTHER): Payer: Self-pay | Admitting: Surgery

## 2012-09-13 ENCOUNTER — Ambulatory Visit (INDEPENDENT_AMBULATORY_CARE_PROVIDER_SITE_OTHER): Payer: Medicare Other | Admitting: Surgery

## 2012-09-13 VITALS — BP 130/80 | HR 72 | Temp 98.6°F | Resp 16 | Ht 63.0 in | Wt 195.2 lb

## 2012-09-13 DIAGNOSIS — R599 Enlarged lymph nodes, unspecified: Secondary | ICD-10-CM | POA: Insufficient documentation

## 2012-09-13 NOTE — Patient Instructions (Signed)
Swollen Lymph Nodes  The lymphatic system filters fluid from around cells. It is like a system of blood vessels. These channels carry lymph instead of blood. The lymphatic system is an important part of the immune (disease fighting) system. When people talk about "swollen glands in the neck," they are usually talking about swollen lymph nodes. The lymph nodes are like the little traps for infection. You and your caregiver may be able to feel lymph nodes, especially swollen nodes, in these common areas: the groin (inguinal area), armpits (axilla), and above the clavicle (supraclavicular). You may also feel them in the neck (cervical) and the back of the head just above the hairline (occipital).  Swollen glands occur when there is any condition in which the body responds with an allergic type of reaction. For instance, the glands in the neck can become swollen from insect bites or any type of minor infection on the head. These are very noticeable in children with only minor problems. Lymph nodes may also become swollen when there is a tumor or problem with the lymphatic system, such as Hodgkin's disease.  TREATMENT   · Most swollen glands do not require treatment. They can be observed (watched) for a short period of time, if your caregiver feels it is necessary. Most of the time, observation is not necessary.  · Antibiotics (medicines that kill germs) may be prescribed by your caregiver. Your caregiver may prescribe these if he or she feels the swollen glands are due to a bacterial (germ) infection. Antibiotics are not used if the swollen glands are caused by a virus.  HOME CARE INSTRUCTIONS   · Take medications as directed by your caregiver. Only take over-the-counter or prescription medicines for pain, discomfort, or fever as directed by your caregiver.  SEEK MEDICAL CARE IF:   · If you begin to run a temperature greater than 102° F (38.9° C), or as your caregiver suggests.  MAKE SURE YOU:   · Understand these  instructions.  · Will watch your condition.  · Will get help right away if you are not doing well or get worse.  Document Released: 01/23/2002 Document Revised: 04/27/2011 Document Reviewed: 02/02/2005  Exit[employer]® Patient Information ©2014 [employer].

## 2012-09-13 NOTE — Progress Notes (Signed)
General Surgery [facility]  Chief Complaint  Patient presents with  . New Evaluation    lymph nodes left neck - referral from Dr. Guerry Bruin    HISTORY: Patient is a 78 year old female referred by her primary care physician for evaluation of abnormal lymph nodes in the left neck identified on CT scan. Patient had sustained a fall. She is on anticoagulation for atrial fibrillation. She was evaluated in the emergency department on 08/06/2012 and underwent a CT scan of the head and cervical spine. Incidental finding was made of enlarged left supraclavicular lymph nodes measuring from 13 mm to 19 mm in size. Further imaging was recommended.  The patient has no history of lymphadenopathy. 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 She has had no B Symptoms including no night sweats and no recent significant changes in weight.  Past Medical History  Diagnosis Date  . Hypertension   . A-fib   . Anxiety   . Arthritis   . Asthma   . Cancer     colon  . CHF (congestive heart failure)   . COPD (chronic obstructive pulmonary disease)   . Emphysema of lung   . Tuberculosis     Current Outpatient Prescriptions  Medication Sig Dispense Refill  . aspirin EC 81 MG tablet Take 81 mg by mouth every morning.      [name] atorvastatin (LIPITOR) 40 MG tablet Take 40 mg by mouth daily.      [name] docusate sodium (COLACE) 100 MG capsule TAKE 1 CAPSULE TWICE A DAY  60 capsule  3  . hydrocortisone cream 0.5 % Apply 1 application topically 2 (two) times daily as needed (for itchiness).      . isosorbide dinitrate (ISORDIL) 10 MG tablet Take 10 mg by mouth 2 (two) times daily.      [name] levalbuterol (XOPENEX HFA) 45 MCG/ACT inhaler Inhale 1-2 puffs into the lungs every 4 (four) hours as needed for wheezing or shortness of breath.      . levothyroxine (SYNTHROID, LEVOTHROID) 25 MCG tablet Take 25 mcg by mouth every morning.      [name] lisinopril  (PRINIVIL,ZESTRIL) 5 MG tablet Take 5 mg by mouth every morning.      . mometasone (NASONEX) 50 MCG/ACT nasal spray Place 2 sprays into the nose daily as needed (for allergies).      . morphine (MS CONTIN) 60 MG 12 hr tablet Take 60 mg by mouth 2 (two) times daily.      [name] morphine (MSIR) 30 MG tablet Take 30 mg by mouth daily as needed for pain.      . nitroGLYCERIN (NITROLINGUAL) 0.4 MG/SPRAY spray Place 1 spray under the tongue every 5 (five) minutes as needed for chest pain.      . promethazine (PHENERGAN) 25 MG tablet Take 25 mg by mouth every 6 (six) hours as needed for nausea (prn).      . sertraline (ZOLOFT) 100 MG tablet Take 50 mg by mouth every morning.       . TOPROL XL 50 MG 24 hr tablet Take 1 tablet by mouth daily, with or immediately following a meal.  30 tablet  6  . warfarin (COUMADIN) 4 MG tablet Take 4 mg by mouth daily.      [name] zolpidem (AMBIEN CR) 6.25 MG CR tablet Take 3.125-6.25 mg by mouth at bedtime as needed for sleep.       No current facility-administered medications  for this visit.    Allergies  Allergen Reactions  . Codeine Anaphylaxis, Hives and Nausea And Vomiting  . Ciprofloxacin Hives and Nausea And Vomiting  . Latex Hives and Rash  . Penicillins Hives and Rash  . Sulfonamide Derivatives Hives and Rash    Family History  Problem Relation Age of Onset  . Heart disease Father     History   Social History  . Marital Status: Married    Spouse Name: N/A    Number of Children: N/A  . Years of Education: N/A   Social History Main Topics  . Smoking status: Never Smoker   . Smokeless tobacco: Never Used  . Alcohol Use: No  . Drug Use: No  . Sexually Active: No   Other Topics Concern  . None   Social History Narrative  . None    REVIEW OF SYSTEMS - PERTINENT POSITIVES ONLY: Denies night sweats. Denies other lymphadenopathy. Denies recent infection.  EXAM: Filed Vitals:   09/13/12 1612  BP: 130/80  Pulse: 72  Temp: 98.6 F (37 C)   Resp: 16    HEENT: normocephalic; pupils equal and reactive; sclerae clear; dentition good; mucous membranes moist NECK:  No palpable masses in the thyroid bed; symmetric on extension; no palpable anterior or posterior cervical lymphadenopathy; no supraclavicular masses; no tenderness; minimal nodularity in the left supraclavicular fossa without dominant or discrete mass or lymphadenopathy palpable CHEST: clear to auscultation bilaterally without rales, rhonchi, or wheezes CARDIAC: regular rate and rhythm without significant murmur; peripheral pulses are full EXT:  non-tender with marked edema and deformity of the lower extremities consistent with lymphedema NEURO: no gross focal deficits; no sign of tremor   LABORATORY RESULTS: See [facility] (CHL-Epic) for most recent results  RADIOLOGY RESULTS: See [facility] (CHL-Epic) for most recent results  IMPRESSION: #1 incidental finding mildly enlarged lymph nodes left neck and supraclavicular fossa #2 bilateral lower extremity lymphedema  PLAN: Patient will undergo an ultrasound of the thyroid and the soft tissues of the left neck. Hopefully they will be able to identify the lymph nodes in question and determine whether or not to have normal anatomy. If there is a normal fatty hilum and no other abnormality, then I think she requires no further workup. If there is significant abnormality on ultrasound, then we will proceed with ultrasound-guided fine-needle aspiration biopsy once her anti-coagulation is discontinued.  Patient will undergo ultrasound and I will contact her with the results.  Velora Heckler, MD, FACS General & Endocrine Surgery [facility]  Primary Care Physician: Gaspar Garbe, MD

## 2012-09-15 ENCOUNTER — Ambulatory Visit: Payer: Medicare Other | Admitting: Pharmacist Clinician (PhC)/ Clinical Pharmacy Specialist

## 2012-09-15 ENCOUNTER — Encounter (INDEPENDENT_AMBULATORY_CARE_PROVIDER_SITE_OTHER): Payer: Self-pay

## 2012-09-19 ENCOUNTER — Ambulatory Visit
Admission: RE | Admit: 2012-09-19 | Discharge: 2012-09-19 | Disposition: A | Discharge status: Home / Self Care | Payer: Medicare Other | Source: Ambulatory Visit | Attending: Surgery | Admitting: Surgery

## 2012-09-19 DIAGNOSIS — R599 Enlarged lymph nodes, unspecified: Secondary | ICD-10-CM | POA: Diagnosis not present

## 2012-09-20 ENCOUNTER — Telehealth (INDEPENDENT_AMBULATORY_CARE_PROVIDER_SITE_OTHER): Payer: Self-pay

## 2012-09-20 NOTE — Telephone Encounter (Signed)
I reviewed u/s with Dr Abbey Chatters. He advised me to tell pt that there is one enlarged node left supraclavicular. Thyroid normal. He suggests that Dr Gerrit Friends will review this report and decide if area needs to be biopsied or followed closely once he returns next week. Pt will be called once he reviews. Pt advised and awaiting call back.

## 2012-09-20 NOTE — Telephone Encounter (Signed)
Pt called asking for u/s result. Pt advised report is not yet dictated in epic. Pt advised once result is in epic I can send request for Dr Gerrit Friends to review and advise. Pt aware Dr Gerrit Friends is not in office this week.

## 2012-09-20 NOTE — Telephone Encounter (Signed)
U/S result in epic and request forwarded to Dr Gerrit Friends to review.

## 2012-09-27 ENCOUNTER — Other Ambulatory Visit (INDEPENDENT_AMBULATORY_CARE_PROVIDER_SITE_OTHER): Payer: Self-pay

## 2012-09-27 ENCOUNTER — Telehealth (INDEPENDENT_AMBULATORY_CARE_PROVIDER_SITE_OTHER): Payer: Self-pay

## 2012-09-27 DIAGNOSIS — R591 Generalized enlarged lymph nodes: Secondary | ICD-10-CM

## 2012-09-27 NOTE — Telephone Encounter (Signed)
Pt advised Dr Gerrit Friends aware she is calling for result. Pt advised Dr Gerrit Friends will review report this week and call pt back.

## 2012-09-27 NOTE — Telephone Encounter (Signed)
Order in epic for fna node bx. Holding scheduling until we get ok for pt to d/c coumadin and aspirin. Dr Gerrit Friends has sent msg to Dr Wylene Simmer.

## 2012-09-27 NOTE — Telephone Encounter (Signed)
Message copied by Joanette Gula on Tue Sep 27, 2012  3:25 PM ------      Message from: Velora Heckler      Created: Tue Sep 27, 2012  2:13 PM       There are 2 lymph nodes which appear enlarged in the left supraclavicular region. These do not appear pathologic. No other abnormality is seen.            I offered the patient the option of continued observation with a repeat study in 6 months versus proceeding with an ultrasound-guided fine-needle aspiration of the enlarged lymph nodes. She prefers to proceed with biopsy. We will make arrangements for this to be performed by the radiologist in the near future. I will contact her with the results of the aspiration biopsy when they are available.            Velora Heckler, MD, [facility]      Office: [phone]             ------

## 2012-09-28 ENCOUNTER — Encounter (INDEPENDENT_AMBULATORY_CARE_PROVIDER_SITE_OTHER): Payer: Self-pay

## 2012-09-28 NOTE — Telephone Encounter (Signed)
Received note from Dr Royann Shivers  stating ok for pt to stop aspirin and coumadin 5 days before procedure. Note sent to be scanned. Sheralyn Boatman at [facility] scheduling advised.

## 2012-09-28 NOTE — Telephone Encounter (Signed)
Received msg from pt that Dr Doreen Salvage at [facility] manages her coumadin. I have sent note via epic to him requesting when we may stop coumadin for bx. Awaiting response.

## 2012-09-29 ENCOUNTER — Telehealth (INDEPENDENT_AMBULATORY_CARE_PROVIDER_SITE_OTHER): Payer: Self-pay

## 2012-09-29 ENCOUNTER — Other Ambulatory Visit: Payer: Self-pay | Admitting: Radiology

## 2012-09-29 NOTE — Telephone Encounter (Signed)
Patient calling into office to make Dr. Gerrit Friends aware that she's a Jehovah Witness and declines receiving any blood products.  She was concerned that it may change Dr. Ardine Eng decision for surgical treatment.  Patient states she plans to proceed with surgery regardless.

## 2012-09-30 ENCOUNTER — Encounter (HOSPITAL_COMMUNITY): Payer: Self-pay | Admitting: Pharmacy Technician

## 2012-09-30 NOTE — Telephone Encounter (Signed)
No surgery is planned for this patient at the present time.  Velora Heckler, MD, [facility] Office: [phone]

## 2012-10-03 ENCOUNTER — Encounter (HOSPITAL_COMMUNITY): Payer: Self-pay

## 2012-10-03 ENCOUNTER — Ambulatory Visit (HOSPITAL_COMMUNITY)
Admission: RE | Admit: 2012-10-03 | Discharge: 2012-10-03 | Disposition: A | Discharge status: Home / Self Care | Payer: Medicare Other | Source: Ambulatory Visit | Attending: Surgery | Admitting: Surgery

## 2012-10-03 DIAGNOSIS — R599 Enlarged lymph nodes, unspecified: Secondary | ICD-10-CM | POA: Diagnosis not present

## 2012-10-03 DIAGNOSIS — Z85038 Personal history of other malignant neoplasm of large intestine: Secondary | ICD-10-CM | POA: Insufficient documentation

## 2012-10-03 DIAGNOSIS — F411 Generalized anxiety disorder: Secondary | ICD-10-CM | POA: Diagnosis not present

## 2012-10-03 DIAGNOSIS — I509 Heart failure, unspecified: Secondary | ICD-10-CM | POA: Insufficient documentation

## 2012-10-03 DIAGNOSIS — R591 Generalized enlarged lymph nodes: Secondary | ICD-10-CM

## 2012-10-03 DIAGNOSIS — M129 Arthropathy, unspecified: Secondary | ICD-10-CM | POA: Insufficient documentation

## 2012-10-03 DIAGNOSIS — J438 Other emphysema: Secondary | ICD-10-CM | POA: Insufficient documentation

## 2012-10-03 DIAGNOSIS — D360 Benign neoplasm of lymph nodes: Secondary | ICD-10-CM | POA: Diagnosis not present

## 2012-10-03 LAB — CBC
MCV: 93.1 fL (ref 78.0–100.0)
Platelets: 137 10*3/uL — ABNORMAL LOW (ref 150–400)
RBC: 3.64 MIL/uL — ABNORMAL LOW (ref 3.87–5.11)
WBC: 5.1 10*3/uL (ref 4.0–10.5)

## 2012-10-03 LAB — PROTIME-INR
INR: 1.29 (ref 0.00–1.49)
Prothrombin Time: 15.8 seconds — ABNORMAL HIGH (ref 11.6–15.2)

## 2012-10-03 LAB — APTT: aPTT: 32 seconds (ref 24–37)

## 2012-10-03 MED ORDER — MIDAZOLAM HCL 2 MG/2ML IJ SOLN
INTRAMUSCULAR | Status: AC | PRN
  Administered 2012-10-03: 1 mg via INTRAVENOUS
  Administered 2012-10-03: 2 mg via INTRAVENOUS

## 2012-10-03 MED ORDER — MIDAZOLAM HCL 2 MG/2ML IJ SOLN
INTRAMUSCULAR | Status: AC
  Filled 2012-10-03: qty 4

## 2012-10-03 MED ORDER — FENTANYL CITRATE 0.05 MG/ML IJ SOLN
INTRAMUSCULAR | Status: AC
  Filled 2012-10-03: qty 4

## 2012-10-03 MED ORDER — FENTANYL CITRATE 0.05 MG/ML IJ SOLN
INTRAMUSCULAR | Status: AC | PRN
  Administered 2012-10-03 (×2): 50 ug via INTRAVENOUS

## 2012-10-03 MED ORDER — SODIUM CHLORIDE 0.9 % IV SOLN: Freq: Once | INTRAVENOUS | Status: DC

## 2012-10-03 NOTE — Procedures (Signed)
L supraclavicular LN Bx 18 gauge core times 4 No comp 

## 2012-10-03 NOTE — H&P (Signed)
Chief Complaint: "I am here for a biopsy of my lymph node." Referring Physician: Dr. Gerrit Friends HPI: Victoria Fisher is an 78 y.o. 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 Patient fell on 08/06/12 and presented to ED. She had a CT performed which showed an incidental finding of enlarged left supraclavicular lymph node. [place] follow-up 09/27/12 consistent with CT. Patient denies any history of lymphadenopathy, weight change, or night sweats. She states she did recently have pneumonia 2 weeks ago which she has finished her antibiotics and denies any cough, fever, chills or hemoptysis at this time. She does have chronic shortness of breath and intermittent chest pains. She denies any blood in her stool or urine.   Past Medical History:  Past Medical History  Diagnosis Date  . Hypertension   . A-fib   . Anxiety   . Arthritis   . Asthma   . Cancer     colon  . CHF (congestive heart failure)   . COPD (chronic obstructive pulmonary disease)   . Emphysema of lung   . Tuberculosis     Past Surgical History:  Past Surgical History  Procedure Laterality Date  . Appendectomy    . Abdominal hysterectomy    . Colon resection      2 times  . Cholecystectomy    . Colon biopsy      Family History:  Family History  Problem Relation Age of Onset  . Heart disease Father     Social History:  reports that she has never smoked. She has never used smokeless tobacco. She reports that she does not drink alcohol or use illicit drugs.  Allergies:  Allergies  Allergen Reactions  . Codeine Anaphylaxis, Hives and Nausea And Vomiting  . Ciprofloxacin Hives and Nausea And Vomiting  . Latex Hives and Rash  . Penicillins Hives and Rash  . Sulfonamide Derivatives Hives and Rash      Medication List    ASK your doctor about these medications       albuterol 108 (90 BASE) MCG/ACT inhaler  Commonly known as:  PROVENTIL HFA;VENTOLIN  HFA  Inhale 2 puffs into the lungs every 6 (six) hours as needed for shortness of breath.     aspirin EC 81 MG tablet  Take 81 mg by mouth every morning.     atorvastatin 40 MG tablet  Commonly known as:  LIPITOR  Take 40 mg by mouth daily.     docusate sodium 100 MG capsule  Commonly known as:  COLACE  Take 100 mg by mouth 2 (two) times daily.     hydrocortisone cream 0.5 %  Apply 1 application topically 2 (two) times daily as needed (for itchiness).     isosorbide dinitrate 10 MG tablet  Commonly known as:  ISORDIL  Take 10 mg by mouth 2 (two) times daily.     levalbuterol 45 MCG/ACT inhaler  Commonly known as:  XOPENEX HFA  Inhale 1-2 puffs into the lungs every 4 (four) hours as needed for wheezing or shortness of breath.     levothyroxine 25 MCG tablet  Commonly known as:  SYNTHROID, LEVOTHROID  Take 25 mcg by mouth every morning.     lisinopril 5 MG tablet  Commonly known as:  PRINIVIL,ZESTRIL  Take 5 mg by mouth every morning.     metoprolol succinate 50 MG 24 hr tablet  Commonly known as:  TOPROL-XL  Take 50 mg by mouth daily.  Take with or immediately following a meal.     mometasone 50 MCG/ACT nasal spray  Commonly known as:  NASONEX  Place 2 sprays into the nose daily as needed (for allergies).     morphine 60 MG 12 hr tablet  Commonly known as:  MS CONTIN  Take 60 mg by mouth 2 (two) times daily.     morphine 30 MG tablet  Commonly known as:  MSIR  Take 30 mg by mouth daily as needed for pain (breakthrough pain).     nitroGLYCERIN 0.4 MG/SPRAY spray  Commonly known as:  NITROLINGUAL  Place 1 spray under the tongue every 5 (five) minutes as needed for chest pain.     potassium chloride 10 MEQ tablet  Commonly known as:  K-DUR  Take 10 mEq by mouth 2 (two) times daily as needed (Take with Torsemide.).     promethazine 25 MG tablet  Commonly known as:  PHENERGAN  Take 25 mg by mouth every 6 (six) hours as needed for nausea (prn).     sertraline 100  MG tablet  Commonly known as:  ZOLOFT  Take 50 mg by mouth every morning.     torsemide 20 MG tablet  Commonly known as:  DEMADEX  Take 20 mg by mouth 2 (two) times daily as needed (fluid retention.).     warfarin 4 MG tablet  Commonly known as:  COUMADIN  Take 4 mg by mouth daily.     zolpidem 6.25 MG CR tablet  Commonly known as:  AMBIEN CR  Take 3.125-6.25 mg by mouth at bedtime as needed for sleep.        Please HPI for pertinent positives, otherwise complete 10 system ROS negative.  Physical Exam: BP 129/72  Pulse 80  Temp(Src) 97.9 F (36.6 C)  Resp 18  SpO2 96% There is no weight on file to calculate BMI.   General Appearance:  Alert, cooperative, no distress, appears stated age  Neck: Supple, symmetrical, trachea midline, palpable left supraclavicular nodularity.   Lungs:   Clear to auscultation bilaterally, no w/r/r, respirations unlabored without use of accessory muscles.  Chest Wall:  No tenderness or deformity  Heart:  Irregularly irregular rate and rhythm, S1, S2 normal, no murmur, rub or gallop.  Abdomen:   Soft, non-tender, non distended.  Extremities: Extremities bilateral 2+ pitting edema, atraumatic, no cyanosis.  Pulses: 2+ and symmetric  Neurologic: Normal affect, no gross deficits.   Results for orders placed during the hospital encounter of 10/03/12 (from the past 48 hour(s))  APTT     Status: None   Collection Time    10/03/12 12:55 PM      Result Value Range   aPTT 32  24 - 37 seconds  CBC     Status: Abnormal   Collection Time    10/03/12 12:55 PM      Result Value Range   WBC 5.1  4.0 - 10.5 K/uL   RBC 3.64 (*) 3.87 - 5.11 MIL/uL   Hemoglobin 11.2 (*) 12.0 - 15.0 g/dL   HCT 16.1 (*) 09.6 - 04.5 %   MCV 93.1  78.0 - 100.0 fL   MCH 30.8  26.0 - 34.0 pg   MCHC 33.0  30.0 - 36.0 g/dL   RDW 40.9  81.1 - 91.4 %   Platelets 137 (*) 150 - 400 K/uL  PROTIME-INR     Status: Abnormal   Collection Time    10/03/12 12:55 PM  Result Value  Range   Prothrombin Time 15.8 (*) 11.6 - 15.2 seconds   INR 1.29  0.00 - 1.49   No results found.  Assessment/Plan S/p fall on 08/06/12 CT showed incidental enlarged left supraclavicular LN. Follow up US head/neck 09/27/12 consistent with CT. History of colon cancer. 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  History of atrial fibrillation, on warfarin which has been held 5 days, INR 1.29 today.  Patient scheduled for US guided left supraclavicular lymph node biopsy. Risks and Benefits discussed with the patient. All of the patient's questions were answered, patient is agreeable to proceed. Consent signed and in chart.   Pattricia Boss D PA-C 10/03/2012, 2:10 PM

## 2012-10-05 NOTE — Progress Notes (Signed)
Quick Note:  Please contact patient and notify of benign pathology results.  Taris Galindo M. Brodrick Curran, MD, FACS [facility] Office: [phone]   ______ 

## 2012-10-06 ENCOUNTER — Ambulatory Visit: Payer: Medicare Other | Admitting: Pharmacist Clinician (PhC)/ Clinical Pharmacy Specialist

## 2012-10-07 ENCOUNTER — Telehealth (INDEPENDENT_AMBULATORY_CARE_PROVIDER_SITE_OTHER): Payer: Self-pay

## 2012-10-07 NOTE — Telephone Encounter (Signed)
Pt called to get path results. I notified pt of her benign path report per Dr Gerrit Friends.

## 2012-10-19 ENCOUNTER — Ambulatory Visit: Payer: Medicare Other | Admitting: Pharmacist Clinician (PhC)/ Clinical Pharmacy Specialist

## 2012-10-20 ENCOUNTER — Ambulatory Visit (INDEPENDENT_AMBULATORY_CARE_PROVIDER_SITE_OTHER): Payer: Medicare Other | Admitting: Pharmacist Clinician (PhC)/ Clinical Pharmacy Specialist

## 2012-10-20 VITALS — BP 134/76

## 2012-10-20 DIAGNOSIS — I4891 Unspecified atrial fibrillation: Secondary | ICD-10-CM

## 2012-10-20 DIAGNOSIS — Z7901 Long term (current) use of anticoagulants: Secondary | ICD-10-CM

## 2012-10-20 LAB — POCT INR: INR: 1.8

## 2012-10-31 ENCOUNTER — Encounter: Payer: Self-pay | Admitting: Cardiovascular Disease

## 2012-11-01 ENCOUNTER — Ambulatory Visit (INDEPENDENT_AMBULATORY_CARE_PROVIDER_SITE_OTHER): Payer: Medicare Other | Admitting: Cardiovascular Disease

## 2012-11-01 ENCOUNTER — Encounter: Payer: Self-pay | Admitting: Cardiovascular Disease

## 2012-11-01 ENCOUNTER — Ambulatory Visit (INDEPENDENT_AMBULATORY_CARE_PROVIDER_SITE_OTHER): Payer: Medicare Other | Admitting: Pharmacist Clinician (PhC)/ Clinical Pharmacy Specialist

## 2012-11-01 VITALS — BP 118/78 | HR 70 | Ht 65.0 in | Wt 196.0 lb

## 2012-11-01 DIAGNOSIS — Z23 Encounter for immunization: Secondary | ICD-10-CM | POA: Diagnosis not present

## 2012-11-01 DIAGNOSIS — I10 Essential (primary) hypertension: Secondary | ICD-10-CM

## 2012-11-01 DIAGNOSIS — Z7901 Long term (current) use of anticoagulants: Secondary | ICD-10-CM

## 2012-11-01 DIAGNOSIS — E785 Hyperlipidemia, unspecified: Secondary | ICD-10-CM

## 2012-11-01 DIAGNOSIS — L259 Unspecified contact dermatitis, unspecified cause: Secondary | ICD-10-CM | POA: Diagnosis not present

## 2012-11-01 DIAGNOSIS — I4891 Unspecified atrial fibrillation: Secondary | ICD-10-CM | POA: Diagnosis not present

## 2012-11-01 DIAGNOSIS — R600 Localized edema: Secondary | ICD-10-CM | POA: Insufficient documentation

## 2012-11-01 DIAGNOSIS — R609 Edema, unspecified: Secondary | ICD-10-CM

## 2012-11-01 DIAGNOSIS — I2510 Atherosclerotic heart disease of native coronary artery without angina pectoris: Secondary | ICD-10-CM | POA: Diagnosis not present

## 2012-11-01 LAB — POCT INR: INR: 1.6

## 2012-11-01 NOTE — Assessment & Plan Note (Signed)
On statin therapy, last lipid profile was excellent LDL at 67 and all other parameters also in the desirable range

## 2012-11-01 NOTE — Patient Instructions (Addendum)
Please temporarily increase the torsemide to 2 tablets daily (a total of 40 mg daily) as needed to prevent excessive edema. I recommend increasing the dose if the edema involves the thighs or if there is any evidence of blistering or weeping of the skin of the calvess. Whenever you increase the dose of torsemide please also double the dose of potassium chloride supplement. Your physician recommends that you weigh, daily, at the same time every day, and in the same amount of clothing. Please record your daily weights on the handout provided and bring it to your next appointment. Keeping a record of your weights helps [place] identify your optimal fluid/weight balance.  Your physician recommends that you schedule a follow-up appointment in: 6 months

## 2012-11-01 NOTE — Assessment & Plan Note (Signed)
On anticoagulation therapy monitored in our Coumadin clinic, good ventricular rate control on metoprolol. No adjustments made. Note history of a fall more than 6 months ago complicated by scalp hematoma but not by life threatening bleeding. No other falls this year.

## 2012-11-01 NOTE — Progress Notes (Signed)
Patient ID: Victoria Fisher, female   DOB: June 15, 1934, 78 y.o.   MRN: [id number]     Reason for office visit Atrial fibrillation, pulmonary hypertension, right heart failure, coronary disease  Victoria Fisher has not had any serious problems with her health since her last appointment but has had worsening edema of her legs. The edema extends all the way to the level of her thighs where it is hard and pitting. It is uncomfortable. It is always symmetrical. She has not had any weeping wounds or blisters on the skin of her feet. She has very prominent pedal edema. She has a reddish patch on her anterior left shin. She believes this is a latex allergy from contact with the underside of a bathroom rug. She has not had fever or chills and the patch does not appear to be warm or extending. She has baseline shortness of breath NYHA functional class II. She has mild orthostatic dizziness at times but has not had any more falls. She did have a fall early in the year including head injury and a scalp hematoma. She is on chronic warfarin anticoagulation for permanent atrial fibrillation.  I think she is still not using CPAP. She has moderate pulmonary hypertension which is the substrate for her right heart failure. 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    Allergies  Allergen Reactions  . Codeine Anaphylaxis, Hives and Nausea And Vomiting  . Ciprofloxacin Hives and Nausea And Vomiting  . Latex Hives and Rash  . Penicillins Hives and Rash  . Sulfonamide Derivatives Hives and Rash    Current Outpatient Prescriptions  Medication Sig Dispense Refill  . albuterol (PROVENTIL HFA;VENTOLIN HFA) 108 (90 BASE) MCG/ACT inhaler Inhale 2 puffs into the lungs every 6 (six) hours as needed for shortness of breath.      [name] aspirin EC 81 MG tablet Take 81 mg by mouth every morning.      [name] atorvastatin (LIPITOR) 40 MG tablet Take 40 mg by mouth daily.      [name] docusate  sodium (COLACE) 100 MG capsule Take 100 mg by mouth 2 (two) times daily.      . hydrocortisone cream 0.5 % Apply 1 application topically 2 (two) times daily as needed (for itchiness).      . isosorbide dinitrate (ISORDIL) 10 MG tablet Take 10 mg by mouth 2 (two) times daily.      [name] levalbuterol (XOPENEX HFA) 45 MCG/ACT inhaler Inhale 1-2 puffs into the lungs every 4 (four) hours as needed for wheezing or shortness of breath.      . levothyroxine (SYNTHROID, LEVOTHROID) 25 MCG tablet Take 25 mcg by mouth every morning.      [name] lisinopril (PRINIVIL,ZESTRIL) 5 MG tablet Take 5 mg by mouth every morning.      . metoprolol succinate (TOPROL-XL) 50 MG 24 hr tablet Take 50 mg by mouth daily. Take with or immediately following a meal.      . mometasone (NASONEX) 50 MCG/ACT nasal spray Place 2 sprays into the nose daily as needed (for allergies).      . morphine (MS CONTIN) 60 MG 12 hr tablet Take 60 mg by mouth 2 (two) times daily.      [name] morphine (MSIR) 30 MG tablet Take 30 mg by mouth daily as needed for pain (breakthrough pain).       . nitroGLYCERIN (NITROLINGUAL) 0.4 MG/SPRAY spray Place 1 spray under the tongue every 5 (five)  minutes as needed for chest pain.      . potassium chloride (K-DUR) 10 MEQ tablet Take 10 mEq by mouth 2 (two) times daily as needed (Take with Torsemide.).      [name] promethazine (PHENERGAN) 25 MG tablet Take 25 mg by mouth every 6 (six) hours as needed for nausea (prn).      . sertraline (ZOLOFT) 100 MG tablet Take 50 mg by mouth every morning.       . torsemide (DEMADEX) 20 MG tablet Take 20 mg by mouth 2 (two) times daily as needed (fluid retention.).      [name] warfarin (COUMADIN) 4 MG tablet Take 4 mg by mouth daily.      [name] zolpidem (AMBIEN CR) 6.25 MG CR tablet Take 3.125-6.25 mg by mouth at bedtime as needed for sleep.       No current facility-administered medications for this visit.    Past Medical History  Diagnosis Date  . Anxiety   . Arthritis   . COPD (chronic  obstructive pulmonary disease)   . Emphysema of lung   . Tuberculosis   . MI (myocardial infarction) 1999    Unstable angina  . Lung nodule   . Diverticulosis of colon   . Depression   . Milroy's disease     Lymphatic disorder [facility] at [facility]  . Chronic anticoagulation     A fib. On Coumadin.  . IBS (irritable bowel syndrome)   . Edema of both legs     Venous Doppler 05/11/12 was normal & no evidence of thrombus or thrombophlebitis. Chronic LE edema related to both cor pulmonale & lymphedema, hyperlipidemia & HTN involving coronary atherosclerosis by cardiac cath in 2004. Cath showed a tiny old occlusion of the optional diagonal branch & also proximal LAD.  [name] Chronic pain syndrome   . Hyperlipidemia     On a statin. Catheterization 02/12/03 Showed a tiny old occlusion of the optional diagonal branch & also proximal LAD.  [name] Hypertension     Myoview 07/2011 EF = >55%. RV was moderately dilated. LA was severely dilated. Catheterization 02/12/03 Showed a tiny old occlusion of the optional diagonal branch & also proximal LAD.  [name] Coronary artery disease   . Coronary atherosclerosis     Mild. Myoview 07/2011 EF = >55%. RV was moderately dilated. LA was severely dilated. Catheterization 02/12/03 Showed a tiny old occlusion of the optional diagonal branch & also proximal LAD.  [name] Chronic cor pulmonale     Most likely due to COPD (cannot rule out contribution of diastolic LV dysfunction). ECHO 07/30/11 pulmonary artery pressure was estimated to be 50-60% mmHg & there was moderate tricuspid insufficiency.  . Frequent falls     Possibly due to orthostatic hypotension but we have not seen that on our visits. Recent fall 05/2012 with scalp hematoma, swollen left LE, & black eyes.  . Obstructive sleep apnea     Polysomnogram 09/25/11 AHI: 53.6/hr overall & 57.1/hr during REM sleep. Poor compliance previously with CPAP  . Pulmonary hypertension     Pullmonary hypertension, chronic cor pulmonale  w/pulmonary artery pressures of 50&#x2011; 60 mmHg.  . CHF (congestive heart failure)     Acute on chronic, diastolic, improved. Predominantly right heart failure due to cor pulmonale & in turn this is most likely due to untreated OSA.  [name] Dyspnea on exertion     Reluctant to take diuretics  . Atrial fibrillation, chronic     On Coumadin & a beta-blocker  . Cancer  Carcinoma of sigmoid colon 1984 & 1985.  [name] Bowel obstruction     Caused by scar tissue  . Hypothyroidism 2002    On hormone replacement therapy  . Asthma     On nebulizer  . Insomnia     Past Surgical History  Procedure Laterality Date  . Appendectomy  1956  . Abdominal hysterectomy  1983  . Colon resection      2 times  . Cholecystectomy  1984  . Colon biopsy    . Cardiac catheterization  02/12/03    Showed a tiny old occlusion of the optional diagonal branch & also proximal LAD.  [name] Colonoscopy  07/23/10    Family History  Problem Relation Age of Onset  . Heart disease Father   . Alzheimer's disease Mother     History   Social History  . Marital Status: Married    Spouse Name: N/A    Number of Children: N/A  . Years of Education: N/A   Occupational History  . Not on file.   Social History Main Topics  . Smoking status: Never Smoker   . Smokeless tobacco: Never Used  . Alcohol Use: No  . Drug Use: No  . Sexual Activity: No   Other Topics Concern  . Not on file   Social History Narrative  . No narrative on file    Review of systems: The patient specifically denies any chest pain at rest or with exertion, dyspnea at rest, orthopnea, paroxysmal nocturnal dyspnea, syncope, palpitations, focal neurological deficits, intermittent claudication,cough, hemoptysis or wheezing.  The patient also denies abdominal pain, nausea, vomiting, dysphagia, diarrhea, constipation, polyuria, polydipsia, dysuria, hematuria, frequency, urgency, abnormal bleeding or bruising, fever, chills, unexpected weight changes,  mood swings, change in skin or hair texture, change in voice quality, auditory or visual problems, allergic reactions or rashes, new musculoskeletal complaints other than usual "aches and pains".   PHYSICAL EXAM BP 118/78  Pulse 70  Ht 5\' 5"  (1.651 m)  Wt 196 lb (88.905 kg)  BMI 32.62 kg/m2  General: Alert, oriented x3, no distress Head: no evidence of trauma, PERRL, EOMI, no exophtalmos or lid lag, no myxedema, no xanthelasma; normal ears, nose and oropharynx Neck: normal jugular venous pulsations and no hepatojugular reflux; brisk carotid pulses without delay and no carotid bruits Chest: clear to auscultation, no signs of consolidation by percussion or palpation, normal fremitus, symmetrical and full respiratory excursions Cardiovascular: normal position and quality of the apical impulse, regular rhythm, normal first and second heart sounds, no murmurs, rubs or gallops Abdomen: no tenderness or distention, no masses by palpation, no abnormal pulsatility or arterial bruits, normal bowel sounds, no hepatosplenomegaly Extremities: no clubbing, cyanosis; there is 4+ bilateral hard pitting edema of the feet, calves and lower third of her thighs; 2+ radial, ulnar and brachial pulses bilaterally;  cannot palpate any pulses in the lower extremities below the femorals secondary to the edema; no subclavian or femoral bruits Non-raised reddish patch on the anterior shin roughly 6 x 8 cm without warmth or drainage or lymphangitic spread Neurological: grossly nonfocal   EKG: Atrial fibrillation with generalized low-voltage nonspecific ST-T segment changes  Lipid Panel     Component Value Date/Time   CHOL  Value: 105        ATP III CLASSIFICATION:  <200     mg/dL   Desirable  829-562  mg/dL   Borderline High  >=130    mg/dL   High  03/12/2008 0400   TRIG 73 03/12/2008 0400   HDL 35* 03/12/2008 0400   CHOLHDL 3.0 03/12/2008 0400   VLDL 15 03/12/2008 0400   LDLCALC  Value: 55        Total  Cholesterol/HDL:CHD Risk Coronary Heart Disease Risk Table                     Men   Women  1/2 Average Risk   3.4   3.3  Average Risk       5.0   4.4  2 X Average Risk   9.6   7.1  3 X Average Risk  23.4   11.0        Use the calculated Patient Ratio above and the CHD Risk Table to determine the patient's CHD Risk.        ATP III CLASSIFICATION (LDL):  <100     mg/dL   Optimal  540-981  mg/dL   Near or Above                    Optimal  130-159  mg/dL   Borderline  191-478  mg/dL   High  >295     mg/dL   Very High 08/06/3084 5784    BMET    Component Value Date/Time   NA 140 05/06/2012 0514   K 4.5 05/06/2012 0514   CL 105 05/06/2012 0514   CO2 32 03/12/2008 0323   GLUCOSE 93 05/06/2012 0514   BUN 28* 05/06/2012 0514   CREATININE 1.00 05/06/2012 0514   CALCIUM 10.3 03/12/2008 0323   GFRNONAA >60 03/12/2008 0323   GFRAA  Value: >60        The eGFR has been calculated using the MDRD equation. This calculation has not been validated in all clinical situations. eGFR's persistently <60 mL/min signify possible Chronic Kidney Disease. 03/12/2008 0323     ASSESSMENT AND PLAN Bilateral leg edema This is a chronic, multifactorial problem. She has lymphedema but also hydrostatic edema, probably related to cor pulmonale. She has been intermittently treated for obstructive sleep apnea. I have advised her to increase her torsemide dose to 40 mg daily and also start taking potassium supplements. Since she has been recalcitrant to perform daily weights, it is hard to say what her "optimal dry weight" is. I would probably favor diuretic therapy so that the swelling does not reach above knee level or cause any blistering or weeping of the skin. Have asked her to keep a close eye on the reddish patch on her anterior left shin which she thinks is secondary to latex allergy.  HYPERTENSION Good control  ATRIAL FIBRILLATION, CHRONIC On anticoagulation therapy monitored in our Coumadin clinic, good ventricular rate control  on metoprolol. No adjustments made. Note history of a fall more than 6 months ago complicated by scalp hematoma but not by life threatening bleeding. No other falls this year.  CORONARY ARTERY DISEASE Known occlusion of a proximal diagonal artery, asymptomatic. Last coronary angiogram performed in 2004. Most recent nuclear perfusion study in February of 2012 without reversible ischemia.  HYPERLIPIDEMIA On statin therapy, last lipid profile was excellent LDL at 67 and all other parameters also in the desirable range  Orders Placed This Encounter  Procedures  . EKG 12-Lead   No orders of the defined types were placed in this encounter.    Junious Silk, MD, [facility] [phone] office [phone] pager

## 2012-11-01 NOTE — Assessment & Plan Note (Signed)
Known occlusion of a proximal diagonal artery, asymptomatic. Last coronary angiogram performed in 2004. Most recent nuclear perfusion study in February of 2012 without reversible ischemia.

## 2012-11-01 NOTE — Assessment & Plan Note (Addendum)
This is a chronic, multifactorial problem. She has lymphedema but also hydrostatic edema, probably related to cor pulmonale. She has been intermittently treated for obstructive sleep apnea. I have advised her to increase her torsemide dose to 40 mg daily and also start taking potassium supplements. Since she has been recalcitrant to perform daily weights, it is hard to say what her "optimal dry weight" is. I would probably favor diuretic therapy so that the swelling does not reach above knee level or cause any blistering or weeping of the skin. Have asked her to keep a close eye on the reddish patch on her anterior left shin which she thinks is secondary to latex allergy.

## 2012-11-01 NOTE — Assessment & Plan Note (Signed)
Good control

## 2012-11-04 DIAGNOSIS — I4891 Unspecified atrial fibrillation: Secondary | ICD-10-CM | POA: Diagnosis not present

## 2012-11-04 DIAGNOSIS — I10 Essential (primary) hypertension: Secondary | ICD-10-CM | POA: Diagnosis not present

## 2012-11-04 DIAGNOSIS — Z7901 Long term (current) use of anticoagulants: Secondary | ICD-10-CM | POA: Diagnosis not present

## 2012-11-04 DIAGNOSIS — L259 Unspecified contact dermatitis, unspecified cause: Secondary | ICD-10-CM | POA: Diagnosis not present

## 2012-11-07 DIAGNOSIS — I4891 Unspecified atrial fibrillation: Secondary | ICD-10-CM | POA: Diagnosis not present

## 2012-11-07 DIAGNOSIS — Z7901 Long term (current) use of anticoagulants: Secondary | ICD-10-CM | POA: Diagnosis not present

## 2012-11-07 DIAGNOSIS — L259 Unspecified contact dermatitis, unspecified cause: Secondary | ICD-10-CM | POA: Diagnosis not present

## 2012-11-08 ENCOUNTER — Ambulatory Visit (INDEPENDENT_AMBULATORY_CARE_PROVIDER_SITE_OTHER): Payer: Medicare Other | Admitting: Pharmacist Clinician (PhC)/ Clinical Pharmacy Specialist

## 2012-11-08 VITALS — BP 120/56 | HR 76

## 2012-11-08 DIAGNOSIS — I4891 Unspecified atrial fibrillation: Secondary | ICD-10-CM

## 2012-11-08 DIAGNOSIS — Z7901 Long term (current) use of anticoagulants: Secondary | ICD-10-CM

## 2012-11-14 ENCOUNTER — Telehealth: Payer: Self-pay | Admitting: Pharmacist Clinician (PhC)/ Clinical Pharmacy Specialist

## 2012-11-15 ENCOUNTER — Ambulatory Visit: Payer: Medicare Other | Admitting: Pharmacist Clinician (PhC)/ Clinical Pharmacy Specialist

## 2012-12-13 ENCOUNTER — Other Ambulatory Visit: Payer: Self-pay | Admitting: *Deleted

## 2012-12-13 MED ORDER — NITROGLYCERIN 0.4 MG/SPRAY TL SOLN: 1.0000 | Status: DC | PRN

## 2012-12-13 NOTE — Telephone Encounter (Signed)
Rx was sent to pharmacy electronically. 

## 2012-12-14 ENCOUNTER — Other Ambulatory Visit: Payer: Self-pay | Admitting: *Deleted

## 2012-12-14 MED ORDER — LISINOPRIL 10 MG PO TABS: 5.0000 mg | ORAL_TABLET | Freq: Every morning | ORAL | Status: DC

## 2012-12-14 NOTE — Telephone Encounter (Signed)
Rx was sent to pharmacy electronically. 

## 2012-12-26 ENCOUNTER — Telehealth: Payer: Self-pay | Admitting: Pharmacist Clinician (PhC)/ Clinical Pharmacy Specialist

## 2012-12-26 ENCOUNTER — Ambulatory Visit (INDEPENDENT_AMBULATORY_CARE_PROVIDER_SITE_OTHER): Payer: Medicare Other | Admitting: Pharmacist Clinician (PhC)/ Clinical Pharmacy Specialist

## 2012-12-26 VITALS — BP 104/60 | HR 76

## 2012-12-26 DIAGNOSIS — I4891 Unspecified atrial fibrillation: Secondary | ICD-10-CM | POA: Diagnosis not present

## 2012-12-26 DIAGNOSIS — Z7901 Long term (current) use of anticoagulants: Secondary | ICD-10-CM | POA: Diagnosis not present

## 2012-12-26 LAB — POCT INR: INR: 2.2

## 2012-12-26 NOTE — Telephone Encounter (Signed)
Pt was in office this am for INR check.  States feeling fine, but having increase swelling in ankles, legs recently. Husband has to help her lift legs onto bed at night.   Legs quite swollen, very tight. Stated does not take fluid pill (torsemide 20mg ) every day as it causes her bladder control and constipation problems.  At her last visit with Dr. Royann Shivers he asked her to up dose to 40mg  daily.  Asked pt to take 2 tablets x 2-3 days to decrease swelling then resume 1 tablet daily.   I advised her to start with her constipation medication (Philips MOM) the same day she restarts the torsemide to reduce constipation issues.  Also stressed that if fluid were to continue increasing, pt would need hospitalization to help resolve problem.

## 2012-12-26 NOTE — Telephone Encounter (Signed)
I agree with the plan.

## 2013-01-10 ENCOUNTER — Telehealth: Payer: Self-pay | Admitting: Pharmacist Clinician (PhC)/ Clinical Pharmacy Specialist

## 2013-01-10 DIAGNOSIS — I4891 Unspecified atrial fibrillation: Secondary | ICD-10-CM | POA: Diagnosis not present

## 2013-01-10 DIAGNOSIS — Z85828 Personal history of other malignant neoplasm of skin: Secondary | ICD-10-CM | POA: Diagnosis not present

## 2013-01-10 DIAGNOSIS — Z6832 Body mass index (BMI) 32.0-32.9, adult: Secondary | ICD-10-CM | POA: Diagnosis not present

## 2013-01-10 DIAGNOSIS — I10 Essential (primary) hypertension: Secondary | ICD-10-CM | POA: Diagnosis not present

## 2013-01-10 DIAGNOSIS — I781 Nevus, non-neoplastic: Secondary | ICD-10-CM | POA: Diagnosis not present

## 2013-01-10 DIAGNOSIS — D2370 Other benign neoplasm of skin of unspecified lower limb, including hip: Secondary | ICD-10-CM | POA: Diagnosis not present

## 2013-01-10 DIAGNOSIS — Z7901 Long term (current) use of anticoagulants: Secondary | ICD-10-CM | POA: Diagnosis not present

## 2013-01-10 DIAGNOSIS — R609 Edema, unspecified: Secondary | ICD-10-CM | POA: Diagnosis not present

## 2013-01-10 DIAGNOSIS — S8010XA Contusion of unspecified lower leg, initial encounter: Secondary | ICD-10-CM | POA: Diagnosis not present

## 2013-01-10 NOTE — Telephone Encounter (Signed)
Wants to know when she had her last coumadin check and what were her readings?

## 2013-01-10 NOTE — Telephone Encounter (Signed)
LMOM, pt has appt on 12.8, last INR was on 11/10 and WNL

## 2013-01-20 DIAGNOSIS — Q820 Hereditary lymphedema: Secondary | ICD-10-CM | POA: Diagnosis not present

## 2013-01-20 DIAGNOSIS — I2510 Atherosclerotic heart disease of native coronary artery without angina pectoris: Secondary | ICD-10-CM | POA: Diagnosis not present

## 2013-01-20 DIAGNOSIS — Z6831 Body mass index (BMI) 31.0-31.9, adult: Secondary | ICD-10-CM | POA: Diagnosis not present

## 2013-01-20 DIAGNOSIS — R609 Edema, unspecified: Secondary | ICD-10-CM | POA: Diagnosis not present

## 2013-01-23 ENCOUNTER — Ambulatory Visit (INDEPENDENT_AMBULATORY_CARE_PROVIDER_SITE_OTHER): Payer: Medicare Other | Admitting: Pharmacist Clinician (PhC)/ Clinical Pharmacy Specialist

## 2013-01-23 VITALS — BP 110/64 | HR 68

## 2013-01-23 DIAGNOSIS — Z7901 Long term (current) use of anticoagulants: Secondary | ICD-10-CM | POA: Diagnosis not present

## 2013-01-23 DIAGNOSIS — I4891 Unspecified atrial fibrillation: Secondary | ICD-10-CM | POA: Diagnosis not present

## 2013-01-23 LAB — POCT INR: INR: 1.8

## 2013-02-07 DIAGNOSIS — Q820 Hereditary lymphedema: Secondary | ICD-10-CM | POA: Diagnosis not present

## 2013-02-07 DIAGNOSIS — I10 Essential (primary) hypertension: Secondary | ICD-10-CM | POA: Diagnosis not present

## 2013-02-07 DIAGNOSIS — I2510 Atherosclerotic heart disease of native coronary artery without angina pectoris: Secondary | ICD-10-CM | POA: Diagnosis not present

## 2013-02-07 DIAGNOSIS — E669 Obesity, unspecified: Secondary | ICD-10-CM | POA: Diagnosis not present

## 2013-02-07 DIAGNOSIS — R609 Edema, unspecified: Secondary | ICD-10-CM | POA: Diagnosis not present

## 2013-02-07 DIAGNOSIS — Z7901 Long term (current) use of anticoagulants: Secondary | ICD-10-CM | POA: Diagnosis not present

## 2013-02-07 DIAGNOSIS — I4891 Unspecified atrial fibrillation: Secondary | ICD-10-CM | POA: Diagnosis not present

## 2013-02-07 DIAGNOSIS — J45909 Unspecified asthma, uncomplicated: Secondary | ICD-10-CM | POA: Diagnosis not present

## 2013-02-07 DIAGNOSIS — G4733 Obstructive sleep apnea (adult) (pediatric): Secondary | ICD-10-CM | POA: Diagnosis not present

## 2013-02-20 ENCOUNTER — Ambulatory Visit (INDEPENDENT_AMBULATORY_CARE_PROVIDER_SITE_OTHER): Payer: Medicare Other | Admitting: Pharmacist Clinician (PhC)/ Clinical Pharmacy Specialist

## 2013-02-20 VITALS — BP 110/60 | HR 66

## 2013-02-20 DIAGNOSIS — Z7901 Long term (current) use of anticoagulants: Secondary | ICD-10-CM | POA: Diagnosis not present

## 2013-02-20 DIAGNOSIS — I4891 Unspecified atrial fibrillation: Secondary | ICD-10-CM | POA: Diagnosis not present

## 2013-02-20 LAB — POCT INR: INR: 1.9

## 2013-03-07 DIAGNOSIS — T1490XA Injury, unspecified, initial encounter: Secondary | ICD-10-CM | POA: Diagnosis not present

## 2013-03-07 DIAGNOSIS — Z7901 Long term (current) use of anticoagulants: Secondary | ICD-10-CM | POA: Diagnosis not present

## 2013-03-07 DIAGNOSIS — S0003XA Contusion of scalp, initial encounter: Secondary | ICD-10-CM | POA: Diagnosis not present

## 2013-03-07 DIAGNOSIS — S1093XA Contusion of unspecified part of neck, initial encounter: Secondary | ICD-10-CM | POA: Diagnosis not present

## 2013-03-07 DIAGNOSIS — I4891 Unspecified atrial fibrillation: Secondary | ICD-10-CM | POA: Diagnosis not present

## 2013-03-08 ENCOUNTER — Emergency Department (HOSPITAL_COMMUNITY): Payer: Medicare Other

## 2013-03-08 ENCOUNTER — Emergency Department (HOSPITAL_COMMUNITY)
Admission: EM | Admit: 2013-03-08 | Discharge: 2013-03-08 | Disposition: A | Discharge status: Home / Self Care | Payer: Medicare Other | Attending: Emergency Medicine | Admitting: Emergency Medicine

## 2013-03-08 ENCOUNTER — Encounter (HOSPITAL_COMMUNITY): Payer: Self-pay | Admitting: Radiology

## 2013-03-08 DIAGNOSIS — Q820 Hereditary lymphedema: Secondary | ICD-10-CM | POA: Insufficient documentation

## 2013-03-08 DIAGNOSIS — S0990XA Unspecified injury of head, initial encounter: Secondary | ICD-10-CM | POA: Insufficient documentation

## 2013-03-08 DIAGNOSIS — Z7982 Long term (current) use of aspirin: Secondary | ICD-10-CM | POA: Insufficient documentation

## 2013-03-08 DIAGNOSIS — G894 Chronic pain syndrome: Secondary | ICD-10-CM | POA: Diagnosis not present

## 2013-03-08 DIAGNOSIS — Z8611 Personal history of tuberculosis: Secondary | ICD-10-CM | POA: Insufficient documentation

## 2013-03-08 DIAGNOSIS — I252 Old myocardial infarction: Secondary | ICD-10-CM | POA: Diagnosis not present

## 2013-03-08 DIAGNOSIS — I2510 Atherosclerotic heart disease of native coronary artery without angina pectoris: Secondary | ICD-10-CM | POA: Insufficient documentation

## 2013-03-08 DIAGNOSIS — R609 Edema, unspecified: Secondary | ICD-10-CM | POA: Insufficient documentation

## 2013-03-08 DIAGNOSIS — M546 Pain in thoracic spine: Secondary | ICD-10-CM | POA: Diagnosis not present

## 2013-03-08 DIAGNOSIS — Z880 Allergy status to penicillin: Secondary | ICD-10-CM | POA: Insufficient documentation

## 2013-03-08 DIAGNOSIS — Z7901 Long term (current) use of anticoagulants: Secondary | ICD-10-CM | POA: Insufficient documentation

## 2013-03-08 DIAGNOSIS — S1093XA Contusion of unspecified part of neck, initial encounter: Secondary | ICD-10-CM | POA: Diagnosis not present

## 2013-03-08 DIAGNOSIS — F329 Major depressive disorder, single episode, unspecified: Secondary | ICD-10-CM | POA: Insufficient documentation

## 2013-03-08 DIAGNOSIS — G4733 Obstructive sleep apnea (adult) (pediatric): Secondary | ICD-10-CM | POA: Diagnosis not present

## 2013-03-08 DIAGNOSIS — G4700 Insomnia, unspecified: Secondary | ICD-10-CM | POA: Diagnosis not present

## 2013-03-08 DIAGNOSIS — Z91040 Latex allergy status: Secondary | ICD-10-CM | POA: Insufficient documentation

## 2013-03-08 DIAGNOSIS — I209 Angina pectoris, unspecified: Secondary | ICD-10-CM | POA: Diagnosis not present

## 2013-03-08 DIAGNOSIS — S0003XA Contusion of scalp, initial encounter: Secondary | ICD-10-CM | POA: Diagnosis not present

## 2013-03-08 DIAGNOSIS — Z8719 Personal history of other diseases of the digestive system: Secondary | ICD-10-CM | POA: Insufficient documentation

## 2013-03-08 DIAGNOSIS — W1809XA Striking against other object with subsequent fall, initial encounter: Secondary | ICD-10-CM | POA: Insufficient documentation

## 2013-03-08 DIAGNOSIS — F3289 Other specified depressive episodes: Secondary | ICD-10-CM | POA: Insufficient documentation

## 2013-03-08 DIAGNOSIS — Y929 Unspecified place or not applicable: Secondary | ICD-10-CM | POA: Insufficient documentation

## 2013-03-08 DIAGNOSIS — F411 Generalized anxiety disorder: Secondary | ICD-10-CM | POA: Insufficient documentation

## 2013-03-08 DIAGNOSIS — Z9889 Other specified postprocedural states: Secondary | ICD-10-CM | POA: Diagnosis not present

## 2013-03-08 DIAGNOSIS — W19XXXA Unspecified fall, initial encounter: Secondary | ICD-10-CM

## 2013-03-08 DIAGNOSIS — J4489 Other specified chronic obstructive pulmonary disease: Secondary | ICD-10-CM | POA: Insufficient documentation

## 2013-03-08 DIAGNOSIS — Z9181 History of falling: Secondary | ICD-10-CM | POA: Insufficient documentation

## 2013-03-08 DIAGNOSIS — E785 Hyperlipidemia, unspecified: Secondary | ICD-10-CM | POA: Diagnosis not present

## 2013-03-08 DIAGNOSIS — I10 Essential (primary) hypertension: Secondary | ICD-10-CM | POA: Diagnosis not present

## 2013-03-08 DIAGNOSIS — Z79899 Other long term (current) drug therapy: Secondary | ICD-10-CM | POA: Insufficient documentation

## 2013-03-08 DIAGNOSIS — S0993XA Unspecified injury of face, initial encounter: Secondary | ICD-10-CM | POA: Diagnosis not present

## 2013-03-08 DIAGNOSIS — I5033 Acute on chronic diastolic (congestive) heart failure: Secondary | ICD-10-CM | POA: Diagnosis not present

## 2013-03-08 DIAGNOSIS — Z85038 Personal history of other malignant neoplasm of large intestine: Secondary | ICD-10-CM | POA: Diagnosis not present

## 2013-03-08 DIAGNOSIS — W010XXA Fall on same level from slipping, tripping and stumbling without subsequent striking against object, initial encounter: Secondary | ICD-10-CM | POA: Insufficient documentation

## 2013-03-08 DIAGNOSIS — M129 Arthropathy, unspecified: Secondary | ICD-10-CM | POA: Insufficient documentation

## 2013-03-08 DIAGNOSIS — IMO0002 Reserved for concepts with insufficient information to code with codable children: Secondary | ICD-10-CM | POA: Insufficient documentation

## 2013-03-08 DIAGNOSIS — J449 Chronic obstructive pulmonary disease, unspecified: Secondary | ICD-10-CM | POA: Diagnosis not present

## 2013-03-08 DIAGNOSIS — E039 Hypothyroidism, unspecified: Secondary | ICD-10-CM | POA: Diagnosis not present

## 2013-03-08 DIAGNOSIS — I4891 Unspecified atrial fibrillation: Secondary | ICD-10-CM | POA: Diagnosis not present

## 2013-03-08 DIAGNOSIS — Y939 Activity, unspecified: Secondary | ICD-10-CM | POA: Insufficient documentation

## 2013-03-08 LAB — CBC WITH DIFFERENTIAL/PLATELET
Basophils Absolute: 0 10*3/uL (ref 0.0–0.1)
Basophils Relative: 1 % (ref 0–1)
Eosinophils Absolute: 0.1 10*3/uL (ref 0.0–0.7)
Eosinophils Relative: 2 % (ref 0–5)
HCT: 33.5 % — ABNORMAL LOW (ref 36.0–46.0)
HEMOGLOBIN: 10.7 g/dL — AB (ref 12.0–15.0)
LYMPHS PCT: 26 % (ref 12–46)
Lymphs Abs: 1.2 10*3/uL (ref 0.7–4.0)
MCH: 30.3 pg (ref 26.0–34.0)
MCHC: 31.9 g/dL (ref 30.0–36.0)
MCV: 94.9 fL (ref 78.0–100.0)
MONOS PCT: 11 % (ref 3–12)
Monocytes Absolute: 0.5 10*3/uL (ref 0.1–1.0)
NEUTROS PCT: 60 % (ref 43–77)
Neutro Abs: 2.8 10*3/uL (ref 1.7–7.7)
PLATELETS: 125 10*3/uL — AB (ref 150–400)
RBC: 3.53 MIL/uL — AB (ref 3.87–5.11)
RDW: 15.4 % (ref 11.5–15.5)
WBC: 4.7 10*3/uL (ref 4.0–10.5)

## 2013-03-08 LAB — BASIC METABOLIC PANEL
BUN: 48 mg/dL — AB (ref 6–23)
CHLORIDE: 96 meq/L (ref 96–112)
CO2: 30 meq/L (ref 19–32)
Calcium: 10.3 mg/dL (ref 8.4–10.5)
Creatinine, Ser: 1.56 mg/dL — ABNORMAL HIGH (ref 0.50–1.10)
GFR calc Af Amer: 36 mL/min — ABNORMAL LOW (ref >90)
GFR calc non Af Amer: 31 mL/min — ABNORMAL LOW (ref >90)
GLUCOSE: 95 mg/dL (ref 70–99)
POTASSIUM: 4.6 meq/L (ref 3.7–5.3)
SODIUM: 138 meq/L (ref 137–147)

## 2013-03-08 LAB — PROTIME-INR
INR: 1.93 — AB (ref 0.00–1.49)
Prothrombin Time: 21.5 seconds — ABNORMAL HIGH (ref 11.6–15.2)

## 2013-03-08 NOTE — ED Provider Notes (Signed)
CSN: [id number]     Arrival date & time 03/08/13  0000 History   None    Chief Complaint  Patient presents with  . Fall   (Consider location/radiation/quality/duration/timing/severity/associated sxs/prior Treatment) HPI Comments: 79 yo female presenting after a fall.  She does not think she lost consciousness.  She states that her leg gave out on her, causing the fall.  She has had problems with this sort of thing recently and has seen her primary doctor about this.  She has been told to use a walker, but has not yet gotten one.  She was not using her cane at the time of this fall.  She denies pain, but was told to come to the ED if she hits her head, because she takes Coumadin.  Patient is a 79 y.o. female presenting with fall.  Fall This is a recurrent problem. The current episode started 1 to 2 hours ago. Episode frequency: once. The problem has been resolved. Associated symptoms include headaches. Pertinent negatives include no chest pain, no abdominal pain and no shortness of breath. Nothing aggravates the symptoms. Nothing relieves the symptoms.    Past Medical History  Diagnosis Date  . Anxiety   . Arthritis   . COPD (chronic obstructive pulmonary disease)   . Emphysema of lung   . Tuberculosis   . MI (myocardial infarction) 1999    Unstable angina  . Lung nodule   . Diverticulosis of colon   . Depression   . Milroy's disease     Lymphatic disorder diagnosed at [facility]  . Chronic anticoagulation     A fib. On Coumadin.  . IBS (irritable bowel syndrome)   . Edema of both legs     Venous Doppler 05/11/12 was normal & no evidence of thrombus or thrombophlebitis. Chronic LE edema related to both cor pulmonale & lymphedema, hyperlipidemia & HTN involving coronary atherosclerosis by cardiac cath in 2004. Cath showed a tiny old occlusion of the optional diagonal branch & also proximal LAD.  [name] Chronic pain syndrome   . Hyperlipidemia     On a statin. Catheterization 02/12/03 Showed a  tiny old occlusion of the optional diagonal branch & also proximal LAD.  [name] Hypertension     Myoview 07/2011 EF = >55%. RV was moderately dilated. LA was severely dilated. Catheterization 02/12/03 Showed a tiny old occlusion of the optional diagonal branch & also proximal LAD.  [name] Coronary artery disease   . Coronary atherosclerosis     Mild. Myoview 07/2011 EF = >55%. RV was moderately dilated. LA was severely dilated. Catheterization 02/12/03 Showed a tiny old occlusion of the optional diagonal branch & also proximal LAD.  [name] Chronic cor pulmonale     Most likely due to COPD (cannot rule out contribution of diastolic LV dysfunction). ECHO 07/30/11 pulmonary artery pressure was estimated to be 50-60% mmHg & there was moderate tricuspid insufficiency.  . Frequent falls     Possibly due to orthostatic hypotension but we have not seen that on our visits. Recent fall 05/2012 with scalp hematoma, swollen left LE, & black eyes.  . Obstructive sleep apnea     Polysomnogram 09/25/11 AHI: 53.6/hr overall & 57.1/hr during REM sleep. Poor compliance previously with CPAP  . Pulmonary hypertension     Pullmonary hypertension, chronic cor pulmonale w/pulmonary artery pressures of 50&#x2011; 60 mmHg.  . CHF (congestive heart failure)     Acute on chronic, diastolic, improved. Predominantly right heart failure due to cor pulmonale & in turn  this is most likely due to untreated OSA.  [name] Dyspnea on exertion     Reluctant to take diuretics  . Atrial fibrillation, chronic     On Coumadin & a beta-blocker  . Cancer     Carcinoma of sigmoid colon [place].  [name] Bowel obstruction     Caused by scar tissue  . Hypothyroidism 2002    On hormone replacement therapy  . Asthma     On nebulizer  . Insomnia    Past Surgical History  Procedure Laterality Date  . Appendectomy  1956  . Abdominal hysterectomy  1983  . Colon resection      2 times  . Cholecystectomy  1984  . Colon biopsy    . Cardiac catheterization   02/12/03    Showed a tiny old occlusion of the optional diagonal branch & also proximal LAD.  [name] Colonoscopy  07/23/10   Family History  Problem Relation Age of Onset  . Heart disease Father   . Alzheimer's disease Mother    History  Substance Use Topics  . Smoking status: Never Smoker   . Smokeless tobacco: Never Used  . Alcohol Use: No   OB History   Grav Para Term Preterm Abortions TAB SAB Ect Mult Living                 Review of Systems  Respiratory: Negative for shortness of breath.   Cardiovascular: Negative for chest pain.  Gastrointestinal: Negative for abdominal pain.  Neurological: Positive for headaches.  All other systems reviewed and are negative.    Allergies  Codeine; Ciprofloxacin; Latex; Penicillins; and Sulfonamide derivatives  Home Medications   Current Outpatient Rx  Name  Route  Sig  Dispense  Refill  . albuterol (PROVENTIL HFA;VENTOLIN HFA) 108 (90 BASE) MCG/ACT inhaler   Inhalation   Inhale 2 puffs into the lungs every 6 (six) hours as needed for shortness of breath.         [name] aspirin EC 81 MG tablet   Oral   Take 81 mg by mouth every morning.         [name] atorvastatin (LIPITOR) 40 MG tablet   Oral   Take 40 mg by mouth daily.         [name] docusate sodium (COLACE) 100 MG capsule   Oral   Take 100 mg by mouth 2 (two) times daily.         . hydrocortisone cream 0.5 %   Topical   Apply 1 application topically 2 (two) times daily as needed (for itchiness).         . isosorbide dinitrate (ISORDIL) 10 MG tablet   Oral   Take 10 mg by mouth 2 (two) times daily.         [name] levalbuterol (XOPENEX HFA) 45 MCG/ACT inhaler   Inhalation   Inhale 1-2 puffs into the lungs every 4 (four) hours as needed for wheezing or shortness of breath.         . levothyroxine (SYNTHROID, LEVOTHROID) 25 MCG tablet   Oral   Take 25 mcg by mouth every morning.         [name] lisinopril (PRINIVIL,ZESTRIL) 10 MG tablet   Oral   Take 0.5 tablets (5 mg total)  by mouth every morning.   45 tablet   3   . metoprolol succinate (TOPROL-XL) 50 MG 24 hr tablet   Oral   Take 50 mg by mouth daily. Take with or immediately  following a meal.         . mometasone (NASONEX) 50 MCG/ACT nasal spray   Nasal   Place 2 sprays into the nose daily as needed (for allergies).         . morphine (MS CONTIN) 60 MG 12 hr tablet   Oral   Take 60 mg by mouth 2 (two) times daily.         [name] morphine (MSIR) 30 MG tablet   Oral   Take 30 mg by mouth daily as needed for pain (breakthrough pain).          . nitroGLYCERIN (NITROLINGUAL) 0.4 MG/SPRAY spray   Sublingual   Place 1 spray under the tongue every 5 (five) minutes as needed for chest pain.   12 g   1   . potassium chloride (K-DUR) 10 MEQ tablet   Oral   Take 10 mEq by mouth 2 (two) times daily as needed (Take with Torsemide.).         [name] promethazine (PHENERGAN) 25 MG tablet   Oral   Take 25 mg by mouth every 6 (six) hours as needed for nausea (prn).         . sertraline (ZOLOFT) 100 MG tablet   Oral   Take 50 mg by mouth every morning.          . torsemide (DEMADEX) 20 MG tablet   Oral   Take 20 mg by mouth 2 (two) times daily as needed (fluid retention.).         [name] warfarin (COUMADIN) 4 MG tablet   Oral   Take 4 mg by mouth daily.         [name] zolpidem (AMBIEN CR) 6.25 MG CR tablet   Oral   Take 3.125-6.25 mg by mouth at bedtime as needed for sleep.          BP 109/57  Pulse 70  Temp(Src) 98.1 F (36.7 C) (Oral)  Resp 20  SpO2 97% Physical Exam  Nursing note and vitals reviewed. Constitutional: She is oriented to person, place, and time. She appears well-developed and well-nourished. No distress.  HENT:  Head: Normocephalic and atraumatic.    Mouth/Throat: Oropharynx is clear and moist.  Eyes: Conjunctivae are normal. Pupils are equal, round, and reactive to light. No scleral icterus.  Neck: Normal range of motion. Neck supple. No spinous process tenderness and  no muscular tenderness present.  Cardiovascular: Normal rate, regular rhythm, normal heart sounds and intact distal pulses.   No murmur heard. Pulmonary/Chest: Effort normal and breath sounds normal. No stridor. No respiratory distress. She has no rales.  Abdominal: Soft. Bowel sounds are normal. She exhibits no distension. There is no tenderness.  Musculoskeletal: Normal range of motion. She exhibits edema (BLE).       Thoracic back: She exhibits bony tenderness (upper t spine).  Neurological: She is alert and oriented to person, place, and time.  Skin: Skin is warm and dry. No rash noted.  Psychiatric: She has a normal mood and affect. Her behavior is normal.    ED Course  Procedures (including critical care time) Labs Review Labs Reviewed  CBC WITH DIFFERENTIAL - Abnormal; Notable for the following:    RBC 3.53 (*)    Hemoglobin 10.7 (*)    HCT 33.5 (*)    Platelets 125 (*)    All other components within normal limits  BASIC METABOLIC PANEL - Abnormal; Notable for the following:    BUN 48 (*)  Creatinine, Ser 1.56 (*)    GFR calc non Af Amer 31 (*)    GFR calc Af Amer 36 (*)    All other components within normal limits  PROTIME-INR - Abnormal; Notable for the following:    Prothrombin Time 21.5 (*)    INR 1.93 (*)    All other components within normal limits   Imaging Review Dg Thoracic Spine W/swimmers  03/08/2013   CLINICAL DATA:  Multiple falls with pain.  EXAM: THORACIC SPINE - 2 VIEW + SWIMMERS  COMPARISON:  Chest radiograph March 12, 2008  FINDINGS: There is no evidence of thoracic spine fracture. Alignment is normal. No other significant bone abnormalities are identified.  IMPRESSION: Negative.   Electronically Signed   By: Elon Alas   On: 03/08/2013 01:32   Ct Head Wo Contrast  03/08/2013   CLINICAL DATA:  Fall from standing, hit head on table with hematoma. Anticoagulated patient.  EXAM: CT HEAD WITHOUT CONTRAST  CT CERVICAL SPINE WITHOUT CONTRAST   TECHNIQUE: Multidetector CT imaging of the head and cervical spine was performed following the standard protocol without intravenous contrast. Multiplanar CT image reconstructions of the cervical spine were also generated.  COMPARISON:  CT of the head and cervical spine August 06, 2012.  FINDINGS: CT HEAD FINDINGS  The ventricles and sulci are normal for age. No intraparenchymal hemorrhage, mass effect nor midline shift. Patchy supratentorial white matter hypodensities are less than expected for patient's age and though non-specific suggest sequelae of chronic small vessel ischemic disease. No acute large vascular territory infarcts.  No abnormal extra-axial fluid collections. Basal cisterns are patent. Moderate calcific atherosclerosis of the carotid siphons.  Small left frontal scalp hematoma without subcutaneous gas or radiopaque foreign bodies. No skull fracture. Left maxillary mucosal retention cysts with mild paranasal sinus mucosal thickening, no air-fluid levels. . Status post bilateral ocular lens implants. Similar symmetric enlargement of superior ophthalmic veins could reflect Valsalva. Left dental implant.  CT CERVICAL SPINE FINDINGS  Cervical vertebral bodies are intact with straightened cervical lordosis. Grade 1 C2-3 anterolisthesis is unchanged. C1-2 articulation maintained with severe arthropathy. Severe C5-6 and C6-7 degenerative disc disease, moderate to severe at C3-4 and C4-5, unchanged. No destructive bony lesions. Kissing carotids, coursing retropharyngeal with moderate calcific atherosclerosis. The submandibular glands appear prominent, left greater than right. 14 x 19 mm soft tissue mass in the left supraclavicular fossa unchanged, suspected similar smaller mass in the right neck.  IMPRESSION: CT head: Small left frontal scalp hematoma without underlying skull fracture and no acute intracranial process.  Involutional changes. Mild white matter changes suggest chronic small vessel ischemic  disease, less than expected for age.  CT cervical spine: No acute fracture. Stable grade 1 C2-3 anterolisthesis on degenerative basis.  Left greater than right supraclavicular lymphadenopathy again noted, relatively unchanged.   Electronically Signed   By: Elon Alas   On: 03/08/2013 01:50   Ct Cervical Spine Wo Contrast  03/08/2013   CLINICAL DATA:  Fall from standing, hit head on table with hematoma. Anticoagulated patient.  EXAM: CT HEAD WITHOUT CONTRAST  CT CERVICAL SPINE WITHOUT CONTRAST  TECHNIQUE: Multidetector CT imaging of the head and cervical spine was performed following the standard protocol without intravenous contrast. Multiplanar CT image reconstructions of the cervical spine were also generated.  COMPARISON:  CT of the head and cervical spine August 06, 2012.  FINDINGS: CT HEAD FINDINGS  The ventricles and sulci are normal for age. No intraparenchymal hemorrhage, mass effect nor midline shift.  Patchy supratentorial white matter hypodensities are less than expected for patient's age and though non-specific suggest sequelae of chronic small vessel ischemic disease. No acute large vascular territory infarcts.  No abnormal extra-axial fluid collections. Basal cisterns are patent. Moderate calcific atherosclerosis of the carotid siphons.  Small left frontal scalp hematoma without subcutaneous gas or radiopaque foreign bodies. No skull fracture. Left maxillary mucosal retention cysts with mild paranasal sinus mucosal thickening, no air-fluid levels. . Status post bilateral ocular lens implants. Similar symmetric enlargement of superior ophthalmic veins could reflect Valsalva. Left dental implant.  CT CERVICAL SPINE FINDINGS  Cervical vertebral bodies are intact with straightened cervical lordosis. Grade 1 C2-3 anterolisthesis is unchanged. C1-2 articulation maintained with severe arthropathy. Severe C5-6 and C6-7 degenerative disc disease, moderate to severe at C3-4 and C4-5, unchanged. No  destructive bony lesions. Kissing carotids, coursing retropharyngeal with moderate calcific atherosclerosis. The submandibular glands appear prominent, left greater than right. 14 x 19 mm soft tissue mass in the left supraclavicular fossa unchanged, suspected similar smaller mass in the right neck.  IMPRESSION: CT head: Small left frontal scalp hematoma without underlying skull fracture and no acute intracranial process.  Involutional changes. Mild white matter changes suggest chronic small vessel ischemic disease, less than expected for age.  CT cervical spine: No acute fracture. Stable grade 1 C2-3 anterolisthesis on degenerative basis.  Left greater than right supraclavicular lymphadenopathy again noted, relatively unchanged.   Electronically Signed   By: Elon Alas   On: 03/08/2013 01:50  All radiology studies independently viewed by me.     EKG Interpretation   None     EKG - A fib, rate 66, normal conduction, nonspecific t wave changes, no significant changes from prior.   MDM   1. Fall   2. Closed head injury    79 yo female presenting after fall.  She has a hematoma to head and mild upper t spine TTP on exam.  Plan Head and C spine CT, t spine plain film, and check INR.      imaging negative. Labs show elevated creatinine without recent comparison.  She was offered admission due to her renal dysfunction and closed head injury on coumadin.  She, however, declined this admission.  She has family at home who she states will help her if needed and she states she can secure close PCP follow up.  Advised about her incidental findings on CT.      Houston Siren, MD 03/08/13 [phone]

## 2013-03-08 NOTE — ED Notes (Signed)
Patient arrives via EMS with fall from standing. Struck left side of head on table leaving a hematoma at site. Patient has fallen this past Sunday, Monday, and Tonight. Currently on Warfarin for A-Fib. LOC unknown, patient describes remembering the event, but doesn't know what caused her to fall.

## 2013-03-08 NOTE — ED Notes (Signed)
Patient remains in X-ray 

## 2013-03-08 NOTE — Discharge Instructions (Signed)
Fall Prevention and Home Safety °Falls cause injuries and can affect all age groups. It is possible to use preventive measures to significantly decrease the likelihood of falls. There are many simple measures which can make your home safer and prevent falls. °OUTDOORS °· Repair cracks and edges of walkways and driveways. °· Remove high doorway thresholds. °· Trim shrubbery on the main path into your home. °· Have good outside lighting. °· Clear walkways of tools, rocks, debris, and clutter. °· Check that handrails are not broken and are securely fastened. Both sides of steps should have handrails. °· Have leaves, snow, and ice cleared regularly. °· Use sand or salt on walkways during winter months. °· In the garage, clean up grease or oil spills. °BATHROOM °· Install night lights. °· Install grab bars by the toilet and in the tub and shower. °· Use non-skid mats or decals in the tub or shower. °· Place a plastic non-slip stool in the shower to sit on, if needed. °· Keep floors dry and clean up all water on the floor immediately. °· Remove soap buildup in the tub or shower on a regular basis. °· Secure bath mats with non-slip, double-sided rug tape. °· Remove throw rugs and tripping hazards from the floors. °BEDROOMS °· Install night lights. °· Make sure a bedside light is easy to reach. °· Do not use oversized bedding. °· Keep a telephone by your bedside. °· Have a firm chair with side arms to use for getting dressed. °· Remove throw rugs and tripping hazards from the floor. °KITCHEN °· Keep handles on pots and pans turned toward the center of the stove. Use back burners when possible. °· Clean up spills quickly and allow time for drying. °· Avoid walking on wet floors. °· Avoid hot utensils and knives. °· Position shelves so they are not too high or low. °· Place commonly used objects within easy reach. °· If necessary, use a sturdy step stool with a grab bar when reaching. °· Keep electrical cables out of the  way. °· Do not use floor polish or wax that makes floors slippery. If you must use wax, use non-skid floor wax. °· Remove throw rugs and tripping hazards from the floor. °STAIRWAYS °· Never leave objects on stairs. °· Place handrails on both sides of stairways and use them. Fix any loose handrails. Make sure handrails on both sides of the stairways are as long as the stairs. °· Check carpeting to make sure it is firmly attached along stairs. Make repairs to worn or loose carpet promptly. °· Avoid placing throw rugs at the top or bottom of stairways, or properly secure the rug with carpet tape to prevent slippage. Get rid of throw rugs, if possible. °· Have an electrician put in a light switch at the top and bottom of the stairs. °OTHER FALL PREVENTION TIPS °· Wear low-heel or rubber-soled shoes that are supportive and fit well. Wear closed toe shoes. °· When using a stepladder, make sure it is fully opened and both spreaders are firmly locked. Do not climb a closed stepladder. °· Add color or contrast paint or tape to grab bars and handrails in your home. Place contrasting color strips on first and last steps. °· Learn and use mobility aids as needed. Install an electrical emergency response system. °· Turn on lights to avoid dark areas. Replace light bulbs that burn out immediately. Get light switches that glow. °· Arrange furniture to create clear pathways. Keep furniture in the same place. °·   Firmly attach carpet with non-skid or double-sided tape. °· Eliminate uneven floor surfaces. °· Select a carpet pattern that does not visually hide the edge of steps. °· Be aware of all pets. °OTHER HOME SAFETY TIPS °· Set the water temperature for 120° F (48.8° C). °· Keep emergency numbers on or near the telephone. °· Keep smoke detectors on every level of the home and near sleeping areas. °Document Released: 01/23/2002 Document Revised: 08/04/2011 Document Reviewed: 04/24/2011 °ExitCare® Patient Information ©2014  [employer]. ° °Head Injury, Adult °You have received a head injury. It does not appear serious at this time. Headaches and vomiting are common following head injury. It should be easy to awaken from sleeping. Sometimes it is necessary for you to stay in the emergency department for a while for observation. Sometimes admission to the hospital may be needed. After injuries such as yours, most problems occur within the first 24 hours, but side effects may occur up to 7 10 days after the injury. It is important for you to carefully monitor your condition and contact your health care provider or seek immediate medical care if there is a change in your condition. °WHAT ARE THE TYPES OF HEAD INJURIES? °Head injuries can be as minor as a bump. Some head injuries can be more severe. More severe head injuries include: °· A jarring injury to the brain (concussion). °· A bruise of the brain (contusion). This mean there is bleeding in the brain that can cause swelling. °· A cracked skull (skull fracture). °· Bleeding in the brain that collects, clots, and forms a bump (hematoma). °WHAT CAUSES A HEAD INJURY? °A serious head injury is most likely to happen to someone who is in a car wreck and is not wearing a seat belt. Other causes of major head injuries include bicycle or motorcycle accidents, sports injuries, and falls. °HOW ARE HEAD INJURIES DIAGNOSED? °A complete history of the event leading to the injury and your current symptoms will be helpful in diagnosing head injuries. Many times, pictures of the brain, such as CT or MRI are needed to see the extent of the injury. Often, an overnight hospital stay is necessary for observation.  °WHEN SHOULD I SEEK IMMEDIATE MEDICAL CARE?  °You should get help right away if: °· You have confusion or drowsiness. °· You feel sick to your stomach (nauseous) or have continued, forceful vomiting. °· You have dizziness or unsteadiness that is getting worse. °· You have severe, continued  headaches not relieved by medicine. Only take over-the-counter or prescription medicines for pain, fever, or discomfort as directed by your health care provider. °· You do not have normal function of the arms or legs or are unable to walk. °· You notice changes in the black spots in the center of the colored part of your eye (pupil). °· You have a clear or bloody fluid coming from your nose or ears. °· You have a loss of vision. °During the next 24 hours after the injury, you must stay with someone who can watch you for the warning signs. This person should contact local emergency services (911 in the [place] if you have seizures, you become unconscious, or you are unable to wake up. °HOW CAN I PREVENT A HEAD INJURY IN THE FUTURE? °The most important factor for preventing major head injuries is avoiding motor vehicle accidents.  To minimize the potential for damage to your head, it is crucial to wear seat belts while riding in motor vehicles. Wearing helmets while bike riding   and playing collision sports (like football) is also helpful. Also, avoiding dangerous activities around the house will further help reduce your risk of head injury.  °WHEN CAN I RETURN TO NORMAL ACTIVITIES AND ATHLETICS? °You should be reevaluated by your health care provider before returning to these activities. If you have any of the following symptoms, you should not return to activities or contact sports until 1 week after the symptoms have stopped: °· Persistent headache. °· Dizziness or vertigo. °· Poor attention and concentration. °· Confusion. °· Memory problems. °· Nausea or vomiting. °· Fatigue or tire easily. °· Irritability. °· Intolerant of bright lights or loud noises. °· Anxiety or depression. °· Disturbed sleep. °MAKE SURE YOU:  °· Understand these instructions. °· Will watch your condition. °· Will get help right away if you are not doing well or get worse. °Document Released: 02/02/2005 Document Revised: 11/23/2012 Document  Reviewed: 10/10/2012 °ExitCare® Patient Information ©2014 [employer]. ° °

## 2013-03-20 ENCOUNTER — Ambulatory Visit: Payer: Medicare Other | Admitting: Pharmacist Clinician (PhC)/ Clinical Pharmacy Specialist

## 2013-03-27 DIAGNOSIS — H26499 Other secondary cataract, unspecified eye: Secondary | ICD-10-CM | POA: Diagnosis not present

## 2013-03-28 DIAGNOSIS — H26499 Other secondary cataract, unspecified eye: Secondary | ICD-10-CM | POA: Diagnosis not present

## 2013-03-31 ENCOUNTER — Ambulatory Visit (INDEPENDENT_AMBULATORY_CARE_PROVIDER_SITE_OTHER): Payer: Medicare Other | Admitting: Pharmacist Clinician (PhC)/ Clinical Pharmacy Specialist

## 2013-03-31 VITALS — BP 118/62 | HR 72

## 2013-03-31 DIAGNOSIS — I4891 Unspecified atrial fibrillation: Secondary | ICD-10-CM

## 2013-03-31 DIAGNOSIS — Z7901 Long term (current) use of anticoagulants: Secondary | ICD-10-CM

## 2013-03-31 LAB — POCT INR: INR: 2.4

## 2013-04-24 ENCOUNTER — Telehealth: Payer: Self-pay | Admitting: Pharmacist Clinician (PhC)/ Clinical Pharmacy Specialist

## 2013-04-24 NOTE — Telephone Encounter (Signed)
Nosebleeds over weekend, lasting up to 2 hrs (Friday night), second Saturday am, only took 1/2 dose Sat/Sun.  Recommend to restart tonight, last nosebleed >24 hrs ago.

## 2013-04-28 ENCOUNTER — Ambulatory Visit (INDEPENDENT_AMBULATORY_CARE_PROVIDER_SITE_OTHER): Payer: Medicare Other | Admitting: Pharmacist Clinician (PhC)/ Clinical Pharmacy Specialist

## 2013-04-28 VITALS — BP 112/60 | HR 72

## 2013-04-28 DIAGNOSIS — I4891 Unspecified atrial fibrillation: Secondary | ICD-10-CM

## 2013-04-28 DIAGNOSIS — Z7901 Long term (current) use of anticoagulants: Secondary | ICD-10-CM | POA: Diagnosis not present

## 2013-04-28 LAB — POCT INR: INR: 4.3

## 2013-05-01 DIAGNOSIS — S5420XA Injury of radial nerve at forearm level, unspecified arm, initial encounter: Secondary | ICD-10-CM | POA: Diagnosis not present

## 2013-05-01 DIAGNOSIS — Z7901 Long term (current) use of anticoagulants: Secondary | ICD-10-CM | POA: Diagnosis not present

## 2013-05-01 DIAGNOSIS — I4891 Unspecified atrial fibrillation: Secondary | ICD-10-CM | POA: Diagnosis not present

## 2013-05-01 DIAGNOSIS — I2510 Atherosclerotic heart disease of native coronary artery without angina pectoris: Secondary | ICD-10-CM | POA: Diagnosis not present

## 2013-05-04 ENCOUNTER — Ambulatory Visit: Payer: Medicare Other

## 2013-05-04 ENCOUNTER — Ambulatory Visit: Payer: Medicare Other | Admitting: Cardiovascular Disease

## 2013-05-08 DIAGNOSIS — Q820 Hereditary lymphedema: Secondary | ICD-10-CM | POA: Diagnosis not present

## 2013-05-08 DIAGNOSIS — I10 Essential (primary) hypertension: Secondary | ICD-10-CM | POA: Diagnosis not present

## 2013-05-08 DIAGNOSIS — I4891 Unspecified atrial fibrillation: Secondary | ICD-10-CM | POA: Diagnosis not present

## 2013-05-08 DIAGNOSIS — Z7901 Long term (current) use of anticoagulants: Secondary | ICD-10-CM | POA: Diagnosis not present

## 2013-05-08 DIAGNOSIS — Z6831 Body mass index (BMI) 31.0-31.9, adult: Secondary | ICD-10-CM | POA: Diagnosis not present

## 2013-05-08 DIAGNOSIS — E785 Hyperlipidemia, unspecified: Secondary | ICD-10-CM | POA: Diagnosis not present

## 2013-05-08 DIAGNOSIS — I2510 Atherosclerotic heart disease of native coronary artery without angina pectoris: Secondary | ICD-10-CM | POA: Diagnosis not present

## 2013-05-08 DIAGNOSIS — J45909 Unspecified asthma, uncomplicated: Secondary | ICD-10-CM | POA: Diagnosis not present

## 2013-05-11 ENCOUNTER — Other Ambulatory Visit: Payer: Self-pay | Admitting: *Deleted

## 2013-05-11 MED ORDER — LISINOPRIL 5 MG PO TABS
5.0000 mg | ORAL_TABLET | ORAL | Status: DC
Start: 2013-05-11 — End: 2013-05-12

## 2013-05-11 NOTE — Telephone Encounter (Signed)
Rx was sent to pharmacy electronically. 

## 2013-05-12 ENCOUNTER — Ambulatory Visit (INDEPENDENT_AMBULATORY_CARE_PROVIDER_SITE_OTHER): Payer: Medicare Other | Admitting: Pharmacist Clinician (PhC)/ Clinical Pharmacy Specialist

## 2013-05-12 ENCOUNTER — Other Ambulatory Visit: Payer: Self-pay | Admitting: *Deleted

## 2013-05-12 DIAGNOSIS — Z7901 Long term (current) use of anticoagulants: Secondary | ICD-10-CM

## 2013-05-12 DIAGNOSIS — I4891 Unspecified atrial fibrillation: Secondary | ICD-10-CM | POA: Diagnosis not present

## 2013-05-12 LAB — POCT INR: INR: 3.1

## 2013-05-12 MED ORDER — LISINOPRIL 5 MG PO TABS
5.0000 mg | ORAL_TABLET | ORAL | Status: DC
Start: 2013-05-12 — End: 2013-05-14

## 2013-05-12 NOTE — Telephone Encounter (Signed)
Lisinopril refilled #30 w/1 refill.

## 2013-05-14 ENCOUNTER — Emergency Department (HOSPITAL_COMMUNITY)
Admission: EM | Admit: 2013-05-14 | Discharge: 2013-05-14 | Disposition: A | Discharge status: Home / Self Care | Payer: Medicare Other | Attending: Emergency Medicine | Admitting: Emergency Medicine

## 2013-05-14 ENCOUNTER — Encounter (HOSPITAL_COMMUNITY): Payer: Self-pay | Admitting: Emergency Medicine

## 2013-05-14 ENCOUNTER — Emergency Department (HOSPITAL_COMMUNITY): Payer: Medicare Other

## 2013-05-14 DIAGNOSIS — I252 Old myocardial infarction: Secondary | ICD-10-CM | POA: Insufficient documentation

## 2013-05-14 DIAGNOSIS — I5033 Acute on chronic diastolic (congestive) heart failure: Secondary | ICD-10-CM | POA: Insufficient documentation

## 2013-05-14 DIAGNOSIS — Z880 Allergy status to penicillin: Secondary | ICD-10-CM | POA: Diagnosis not present

## 2013-05-14 DIAGNOSIS — Z9889 Other specified postprocedural states: Secondary | ICD-10-CM | POA: Insufficient documentation

## 2013-05-14 DIAGNOSIS — Z8719 Personal history of other diseases of the digestive system: Secondary | ICD-10-CM | POA: Insufficient documentation

## 2013-05-14 DIAGNOSIS — W010XXA Fall on same level from slipping, tripping and stumbling without subsequent striking against object, initial encounter: Secondary | ICD-10-CM | POA: Insufficient documentation

## 2013-05-14 DIAGNOSIS — F411 Generalized anxiety disorder: Secondary | ICD-10-CM | POA: Insufficient documentation

## 2013-05-14 DIAGNOSIS — M129 Arthropathy, unspecified: Secondary | ICD-10-CM | POA: Diagnosis not present

## 2013-05-14 DIAGNOSIS — G894 Chronic pain syndrome: Secondary | ICD-10-CM | POA: Insufficient documentation

## 2013-05-14 DIAGNOSIS — Z9981 Dependence on supplemental oxygen: Secondary | ICD-10-CM | POA: Insufficient documentation

## 2013-05-14 DIAGNOSIS — Z79899 Other long term (current) drug therapy: Secondary | ICD-10-CM | POA: Insufficient documentation

## 2013-05-14 DIAGNOSIS — I2510 Atherosclerotic heart disease of native coronary artery without angina pectoris: Secondary | ICD-10-CM | POA: Insufficient documentation

## 2013-05-14 DIAGNOSIS — Z7901 Long term (current) use of anticoagulants: Secondary | ICD-10-CM | POA: Insufficient documentation

## 2013-05-14 DIAGNOSIS — Y929 Unspecified place or not applicable: Secondary | ICD-10-CM | POA: Insufficient documentation

## 2013-05-14 DIAGNOSIS — Z8611 Personal history of tuberculosis: Secondary | ICD-10-CM | POA: Diagnosis not present

## 2013-05-14 DIAGNOSIS — R609 Edema, unspecified: Secondary | ICD-10-CM | POA: Insufficient documentation

## 2013-05-14 DIAGNOSIS — G4700 Insomnia, unspecified: Secondary | ICD-10-CM | POA: Diagnosis not present

## 2013-05-14 DIAGNOSIS — S0003XA Contusion of scalp, initial encounter: Secondary | ICD-10-CM | POA: Diagnosis not present

## 2013-05-14 DIAGNOSIS — Z85038 Personal history of other malignant neoplasm of large intestine: Secondary | ICD-10-CM | POA: Insufficient documentation

## 2013-05-14 DIAGNOSIS — Z9181 History of falling: Secondary | ICD-10-CM | POA: Insufficient documentation

## 2013-05-14 DIAGNOSIS — Z91040 Latex allergy status: Secondary | ICD-10-CM | POA: Diagnosis not present

## 2013-05-14 DIAGNOSIS — E785 Hyperlipidemia, unspecified: Secondary | ICD-10-CM | POA: Insufficient documentation

## 2013-05-14 DIAGNOSIS — I209 Angina pectoris, unspecified: Secondary | ICD-10-CM | POA: Diagnosis not present

## 2013-05-14 DIAGNOSIS — S1093XA Contusion of unspecified part of neck, initial encounter: Principal | ICD-10-CM

## 2013-05-14 DIAGNOSIS — I4891 Unspecified atrial fibrillation: Secondary | ICD-10-CM | POA: Insufficient documentation

## 2013-05-14 DIAGNOSIS — S0083XA Contusion of other part of head, initial encounter: Principal | ICD-10-CM

## 2013-05-14 DIAGNOSIS — Z7982 Long term (current) use of aspirin: Secondary | ICD-10-CM | POA: Insufficient documentation

## 2013-05-14 DIAGNOSIS — E039 Hypothyroidism, unspecified: Secondary | ICD-10-CM | POA: Insufficient documentation

## 2013-05-14 DIAGNOSIS — F329 Major depressive disorder, single episode, unspecified: Secondary | ICD-10-CM | POA: Diagnosis not present

## 2013-05-14 DIAGNOSIS — J449 Chronic obstructive pulmonary disease, unspecified: Secondary | ICD-10-CM | POA: Insufficient documentation

## 2013-05-14 DIAGNOSIS — I10 Essential (primary) hypertension: Secondary | ICD-10-CM | POA: Diagnosis not present

## 2013-05-14 DIAGNOSIS — R262 Difficulty in walking, not elsewhere classified: Secondary | ICD-10-CM | POA: Diagnosis not present

## 2013-05-14 DIAGNOSIS — F3289 Other specified depressive episodes: Secondary | ICD-10-CM | POA: Diagnosis not present

## 2013-05-14 DIAGNOSIS — Y9301 Activity, walking, marching and hiking: Secondary | ICD-10-CM | POA: Insufficient documentation

## 2013-05-14 DIAGNOSIS — J4489 Other specified chronic obstructive pulmonary disease: Secondary | ICD-10-CM | POA: Insufficient documentation

## 2013-05-14 DIAGNOSIS — W1809XA Striking against other object with subsequent fall, initial encounter: Secondary | ICD-10-CM | POA: Insufficient documentation

## 2013-05-14 DIAGNOSIS — G4733 Obstructive sleep apnea (adult) (pediatric): Secondary | ICD-10-CM | POA: Diagnosis not present

## 2013-05-14 DIAGNOSIS — IMO0002 Reserved for concepts with insufficient information to code with codable children: Secondary | ICD-10-CM | POA: Insufficient documentation

## 2013-05-14 LAB — PROTIME-INR
INR: 2.76 — AB (ref 0.00–1.49)
PROTHROMBIN TIME: 28.2 s — AB (ref 11.6–15.2)

## 2013-05-14 LAB — CBC WITH DIFFERENTIAL/PLATELET
Basophils Absolute: 0 10*3/uL (ref 0.0–0.1)
Basophils Relative: 1 % (ref 0–1)
EOS ABS: 0 10*3/uL (ref 0.0–0.7)
Eosinophils Relative: 1 % (ref 0–5)
HCT: 33.4 % — ABNORMAL LOW (ref 36.0–46.0)
HEMOGLOBIN: 10.8 g/dL — AB (ref 12.0–15.0)
LYMPHS ABS: 1 10*3/uL (ref 0.7–4.0)
LYMPHS PCT: 17 % (ref 12–46)
MCH: 29.5 pg (ref 26.0–34.0)
MCHC: 32.3 g/dL (ref 30.0–36.0)
MCV: 91.3 fL (ref 78.0–100.0)
MONOS PCT: 9 % (ref 3–12)
Monocytes Absolute: 0.5 10*3/uL (ref 0.1–1.0)
NEUTROS ABS: 4.4 10*3/uL (ref 1.7–7.7)
NEUTROS PCT: 73 % (ref 43–77)
PLATELETS: 143 10*3/uL — AB (ref 150–400)
RBC: 3.66 MIL/uL — AB (ref 3.87–5.11)
RDW: 15.7 % — ABNORMAL HIGH (ref 11.5–15.5)
WBC: 6 10*3/uL (ref 4.0–10.5)

## 2013-05-14 NOTE — ED Notes (Signed)
Patient reports that she takes coumadin. She has hit her head at least 2 times and needs to have her Head evaluated

## 2013-05-14 NOTE — Discharge Instructions (Signed)
Head Injury, Adult  You have received a head injury. It does not appear serious at this time. Headaches and vomiting are common following head injury. It should be easy to awaken from sleeping. Sometimes it is necessary for you to stay in the emergency department for a while for observation. Sometimes admission to the hospital may be needed. After injuries such as yours, most problems occur within the first 24 hours, but side effects may occur up to 7 10 days after the injury. It is important for you to carefully monitor your condition and contact your health care provider or seek immediate medical care if there is a change in your condition.  WHAT ARE THE TYPES OF HEAD INJURIES?  Head injuries can be as minor as a bump. Some head injuries can be more severe. More severe head injuries include:  · A jarring injury to the brain (concussion).  · A bruise of the brain (contusion). This mean there is bleeding in the brain that can cause swelling.  · A cracked skull (skull fracture).  · Bleeding in the brain that collects, clots, and forms a bump (hematoma).  WHAT CAUSES A HEAD INJURY?  A serious head injury is most likely to happen to someone who is in a car wreck and is not wearing a seat belt. Other causes of major head injuries include bicycle or motorcycle accidents, sports injuries, and falls.  HOW ARE HEAD INJURIES DIAGNOSED?  A complete history of the event leading to the injury and your current symptoms will be helpful in diagnosing head injuries. Many times, pictures of the brain, such as CT or MRI are needed to see the extent of the injury. Often, an overnight hospital stay is necessary for observation.   WHEN SHOULD I SEEK IMMEDIATE MEDICAL CARE?   You should get help right away if:  · You have confusion or drowsiness.  · You feel sick to your stomach (nauseous) or have continued, forceful vomiting.  · You have dizziness or unsteadiness that is getting worse.  · You have severe, continued headaches not  relieved by medicine. Only take over-the-counter or prescription medicines for pain, fever, or discomfort as directed by your health care provider.  · You do not have normal function of the arms or legs or are unable to walk.  · You notice changes in the black spots in the center of the colored part of your eye (pupil).  · You have a clear or bloody fluid coming from your nose or ears.  · You have a loss of vision.  During the next 24 hours after the injury, you must stay with someone who can watch you for the warning signs. This person should contact local emergency services (911 in the [place] if you have seizures, you become unconscious, or you are unable to wake up.  HOW CAN I PREVENT A HEAD INJURY IN THE FUTURE?  The most important factor for preventing major head injuries is avoiding motor vehicle accidents.  To minimize the potential for damage to your head, it is crucial to wear seat belts while riding in motor vehicles. Wearing helmets while bike riding and playing collision sports (like football) is also helpful. Also, avoiding dangerous activities around the house will further help reduce your risk of head injury.   WHEN CAN I RETURN TO NORMAL ACTIVITIES AND ATHLETICS?  You should be reevaluated by your health care provider before returning to these activities. If you have any of the following symptoms, you should not return   to activities or contact sports until 1 week after the symptoms have stopped:  · Persistent headache.  · Dizziness or vertigo.  · Poor attention and concentration.  · Confusion.  · Memory problems.  · Nausea or vomiting.  · Fatigue or tire easily.  · Irritability.  · Intolerant of bright lights or loud noises.  · Anxiety or depression.  · Disturbed sleep.  MAKE SURE YOU:   · Understand these instructions.  · Will watch your condition.  · Will get help right away if you are not doing well or get worse.  Document Released: 02/02/2005 Document Revised: 11/23/2012 Document Reviewed:  10/10/2012  [employer]® Patient Information ©2014 [employer].

## 2013-05-14 NOTE — ED Provider Notes (Signed)
CSN: [id number]     Arrival date & time 05/14/13  1755 History   First MD Initiated Contact with Patient 05/14/13 1906     Chief Complaint  Patient presents with  . Fall  . Head Injury    HPI Patient presents to the emergency room with complaints of fall and head injury. Patient has a history of chronic edema of her lower extremities. Associated with this significant edema she is always had difficulty with walking and has had frequent falls for an extended period of time. Patient states today she was walking when she tripped and stumbled and hit her head. Patient did not lose consciousness. She does have some soreness in the back of her right head. Patient takes Coumadin will and knows that she needs to get evaluated whenever she struck her head. She denies any nausea or vomiting. She denies any other injuries from her fall Past Medical History  Diagnosis Date  . Anxiety   . Arthritis   . COPD (chronic obstructive pulmonary disease)   . Emphysema of lung   . Tuberculosis   . MI (myocardial infarction) 1999    Unstable angina  . Lung nodule   . Diverticulosis of colon   . Depression   . Milroy's disease     Lymphatic disorder diagnosed at [facility]  . Chronic anticoagulation     A fib. On Coumadin.  . IBS (irritable bowel syndrome)   . Edema of both legs     Venous Doppler 05/11/12 was normal & no evidence of thrombus or thrombophlebitis. Chronic LE edema related to both cor pulmonale & lymphedema, hyperlipidemia & HTN involving coronary atherosclerosis by cardiac cath in 2004. Cath showed a tiny old occlusion of the optional diagonal branch & also proximal LAD.  [name] Chronic pain syndrome   . Hyperlipidemia     On a statin. Catheterization 02/12/03 Showed a tiny old occlusion of the optional diagonal branch & also proximal LAD.  [name] Hypertension     Myoview 07/2011 EF = >55%. RV was moderately dilated. LA was severely dilated. Catheterization 02/12/03 Showed a tiny old occlusion of the optional  diagonal branch & also proximal LAD.  [name] Coronary artery disease   . Coronary atherosclerosis     Mild. Myoview 07/2011 EF = >55%. RV was moderately dilated. LA was severely dilated. Catheterization 02/12/03 Showed a tiny old occlusion of the optional diagonal branch & also proximal LAD.  [name] Chronic cor pulmonale     Most likely due to COPD (cannot rule out contribution of diastolic LV dysfunction). ECHO 07/30/11 pulmonary artery pressure was estimated to be 50-60% mmHg & there was moderate tricuspid insufficiency.  . Frequent falls     Possibly due to orthostatic hypotension but we have not seen that on our visits. Recent fall 05/2012 with scalp hematoma, swollen left LE, & black eyes.  . Obstructive sleep apnea     Polysomnogram 09/25/11 AHI: 53.6/hr overall & 57.1/hr during REM sleep. Poor compliance previously with CPAP  . Pulmonary hypertension     Pullmonary hypertension, chronic cor pulmonale w/pulmonary artery pressures of 50&#x2011; 60 mmHg.  . CHF (congestive heart failure)     Acute on chronic, diastolic, improved. Predominantly right heart failure due to cor pulmonale & in turn this is most likely due to untreated OSA.  [name] Dyspnea on exertion     Reluctant to take diuretics  . Atrial fibrillation, chronic     On Coumadin & a beta-blocker  . Cancer  Carcinoma of sigmoid colon [place].  [name] Bowel obstruction     Caused by scar tissue  . Hypothyroidism 2002    On hormone replacement therapy  . Asthma     On nebulizer  . Insomnia    Past Surgical History  Procedure Laterality Date  . Appendectomy  1956  . Abdominal hysterectomy  1983  . Colon resection      2 times  . Cholecystectomy  1984  . Colon biopsy    . Cardiac catheterization  02/12/03    Showed a tiny old occlusion of the optional diagonal branch & also proximal LAD.  [name] Colonoscopy  07/23/10   Family History  Problem Relation Age of Onset  . Heart disease Father   . Alzheimer's disease Mother    History   Substance Use Topics  . Smoking status: Never Smoker   . Smokeless tobacco: Never Used  . Alcohol Use: No   OB History   Grav Para Term Preterm Abortions TAB SAB Ect Mult Living                 Review of Systems  All other systems reviewed and are negative.      Allergies  Codeine; Ciprofloxacin; Latex; Penicillins; and Sulfonamide derivatives  Home Medications   Current Outpatient Rx  Name  Route  Sig  Dispense  Refill  . albuterol (PROVENTIL HFA;VENTOLIN HFA) 108 (90 BASE) MCG/ACT inhaler   Inhalation   Inhale 2 puffs into the lungs every 6 (six) hours as needed for shortness of breath.         [name] aspirin EC 81 MG tablet   Oral   Take 81 mg by mouth every evening.          [name] atorvastatin (LIPITOR) 40 MG tablet   Oral   Take 40 mg by mouth daily.         . Cholecalciferol (VITAMIN D PO)   Oral   Take 2 tablets by mouth every morning.         . hydrocortisone cream 0.5 %   Topical   Apply 1 application topically 2 (two) times daily as needed (for itchiness).         . isosorbide dinitrate (ISORDIL) 10 MG tablet   Oral   Take 5 mg by mouth 2 (two) times daily.          [name] levalbuterol (XOPENEX HFA) 45 MCG/ACT inhaler   Inhalation   Inhale 1-2 puffs into the lungs every 4 (four) hours as needed for wheezing or shortness of breath.         . levothyroxine (SYNTHROID, LEVOTHROID) 25 MCG tablet   Oral   Take 25 mcg by mouth every morning.         [name] lisinopril (PRINIVIL,ZESTRIL) 5 MG tablet   Oral   Take 1 tablet (5 mg total) by mouth every morning.   30 tablet   2   . metoprolol succinate (TOPROL-XL) 50 MG 24 hr tablet   Oral   Take 50 mg by mouth daily. Take with or immediately following a meal.         . mometasone (NASONEX) 50 MCG/ACT nasal spray   Nasal   Place 2 sprays into the nose daily as needed (for allergies).         . morphine (MS CONTIN) 60 MG 12 hr tablet   Oral   Take 60 mg by mouth every evening.          [name]  morphine (MSIR) 30 MG tablet   Oral   Take 30 mg by mouth daily as needed for pain (breakthrough pain).          . nitroGLYCERIN (NITROLINGUAL) 0.4 MG/SPRAY spray   Sublingual   Place 1 spray under the tongue every 5 (five) minutes as needed for chest pain.   12 g   1   . potassium chloride (K-DUR) 10 MEQ tablet   Oral   Take 10 mEq by mouth 2 (two) times daily as needed (Take with Torsemide.).         [name] promethazine (PHENERGAN) 25 MG tablet   Oral   Take 25 mg by mouth every 6 (six) hours as needed for nausea (prn).         . sertraline (ZOLOFT) 100 MG tablet   Oral   Take 50 mg by mouth every morning.          . torsemide (DEMADEX) 20 MG tablet   Oral   Take 20 mg by mouth 2 (two) times daily as needed (fluid retention.).         [name] warfarin (COUMADIN) 4 MG tablet   Oral   Take 4 mg by mouth daily.         [name] zolpidem (AMBIEN CR) 6.25 MG CR tablet   Oral   Take 3.125-6.25 mg by mouth at bedtime as needed for sleep.          BP 106/36  Pulse 70  Temp(Src) 98.5 F (36.9 C) (Oral)  Resp 14  SpO2 97% Physical Exam  Nursing note and vitals reviewed. Constitutional: She is oriented to person, place, and time. She appears well-developed and well-nourished. No distress.  HENT:  Head: Normocephalic. Head is with contusion. Head is without raccoon's eyes and without Battle's sign.  Right Ear: External ear normal.  Left Ear: External ear normal.  Eyes: Conjunctivae are normal. Right eye exhibits no discharge. Left eye exhibits no discharge. No scleral icterus.  Neck: Neck supple. No tracheal deviation present.  Cardiovascular: Normal rate.   Pulmonary/Chest: Effort normal. No stridor. No respiratory distress.  Musculoskeletal: She exhibits edema (tense pitting edema bilateral lower extremities). She exhibits no tenderness.       Cervical back: Normal.       Thoracic back: Normal.       Lumbar back: Normal.  Neurological: She is alert and oriented to person,  place, and time. No cranial nerve deficit (no gross deficits) or sensory deficit.  Weakness grip strength LUE, (pt has been diagnosed with a peripheral nerve etiology)  Skin: Skin is warm and dry. No rash noted.  Psychiatric: She has a normal mood and affect.    ED Course  Procedures (including critical care time) Labs Review Labs Reviewed  CBC WITH DIFFERENTIAL - Abnormal; Notable for the following:    RBC 3.66 (*)    Hemoglobin 10.8 (*)    HCT 33.4 (*)    RDW 15.7 (*)    Platelets 143 (*)    All other components within normal limits  PROTIME-INR - Abnormal; Notable for the following:    Prothrombin Time 28.2 (*)    INR 2.76 (*)    All other components within normal limits   Imaging Review Ct Head Wo Contrast  05/14/2013   CLINICAL DATA:  Fall, on blood thinners, headache and history of hypertension  EXAM: CT HEAD WITHOUT CONTRAST  TECHNIQUE: Contiguous axial images were obtained from the base of the skull through the vertex  without intravenous contrast.  COMPARISON:  Most recent prior CT head and cervical spine 03/08/2013  FINDINGS: Negative for acute intracranial hemorrhage, acute infarction, mass, mass effect, hydrocephalus or midline shift. Gray-white differentiation is preserved throughout. Right parietal scalp hematoma. No evidence of underlying calvarial fracture. Very mild cerebral atrophy and sequelae of chronic microvascular ischemic white matter disease. Normal aeration of the mastoid air cells. Interval development of an air-fluid level in the left maxillary sinus and partial opacification of scattered ethmoid air cells suggests active inflammatory paranasal sinus disease. Globes and orbits are intact and symmetric bilaterally. Atherosclerotic calcification noted in the bilateral cavernous carotid arteries.  IMPRESSION: 1. No acute intracranial abnormality. 2. Right parietal scalp hematoma without evidence of underlying fracture. 3. Interval development of an air-fluid level in  the left maxillary sinus suggests active inflammatory paranasal sinus disease. 4. Stable mild cerebral involutional changes and a sequelae of chronic microvascular ischemic white matter disease.   Electronically Signed   By: Jacqulynn Cadet M.D.   On: 05/14/2013 19:35    MDM   Final diagnoses:  Scalp hematoma    CT scan without subdural hematoma or SAH.  I discussed the findings with the patient.  She has been treated for sinus problems and is improving.  At this time there does not appear to be any evidence of an acute emergency medical condition and the patient appears stable for discharge with appropriate outpatient follow up.     Kathalene Frames, MD 05/14/13 2031

## 2013-05-15 ENCOUNTER — Ambulatory Visit: Payer: Medicare Other

## 2013-05-17 ENCOUNTER — Telehealth: Payer: Self-pay | Admitting: Pharmacist Clinician (PhC)/ Clinical Pharmacy Specialist

## 2013-05-17 NOTE — Telephone Encounter (Signed)
Pt called - lost paperwork with dosing instructions.   Repeated to patient.

## 2013-05-30 ENCOUNTER — Encounter: Payer: Self-pay | Admitting: Cardiovascular Disease

## 2013-05-30 ENCOUNTER — Ambulatory Visit (INDEPENDENT_AMBULATORY_CARE_PROVIDER_SITE_OTHER): Payer: Medicare Other | Admitting: Cardiovascular Disease

## 2013-05-30 ENCOUNTER — Ambulatory Visit (INDEPENDENT_AMBULATORY_CARE_PROVIDER_SITE_OTHER): Payer: Medicare Other | Admitting: *Deleted

## 2013-05-30 VITALS — BP 102/56 | HR 64 | Resp 20 | Ht 65.0 in | Wt 195.5 lb

## 2013-05-30 DIAGNOSIS — I272 Pulmonary hypertension, unspecified: Secondary | ICD-10-CM

## 2013-05-30 DIAGNOSIS — I4891 Unspecified atrial fibrillation: Secondary | ICD-10-CM

## 2013-05-30 DIAGNOSIS — R269 Unspecified abnormalities of gait and mobility: Secondary | ICD-10-CM | POA: Diagnosis not present

## 2013-05-30 DIAGNOSIS — Z7901 Long term (current) use of anticoagulants: Secondary | ICD-10-CM

## 2013-05-30 DIAGNOSIS — I2789 Other specified pulmonary heart diseases: Secondary | ICD-10-CM | POA: Diagnosis not present

## 2013-05-30 DIAGNOSIS — R2681 Unsteadiness on feet: Secondary | ICD-10-CM

## 2013-05-30 LAB — POCT INR: INR: 3.6

## 2013-05-30 NOTE — Patient Instructions (Signed)
STOP Warfarin, Lisinopril, Lipitor (Atorvastatin) and Isordil (Isosorbide).  Your physician has requested that you have an echocardiogram. Echocardiography is a painless test that uses sound waves to create images of your heart. It provides your doctor with information about the size and shape of your heart and how well your heart's chambers and valves are working. This procedure takes approximately one hour. There are no restrictions for this procedure.  A referral has been placed to [facility] with Dr. Brett Fairy.  Your physician recommends that you schedule a follow-up appointment in: 2 weeks.

## 2013-05-31 ENCOUNTER — Encounter: Payer: Self-pay | Admitting: Cardiovascular Disease

## 2013-05-31 NOTE — Progress Notes (Signed)
Stopping warfarin, at least temporarily, due to repeated falls

## 2013-05-31 NOTE — Progress Notes (Signed)
Patient ID: Victoria Fisher, female   DOB: 1934/10/24, 79 y.o.   MRN: [id number]      Reason for office visit Atrial fibrillation, edema, pulmonary hypertension    [name] is not doing well. Over the last few months she has had very frequent falls. She does that she fallen twice in 2 days just last week. She has had several serious falls and has injured her head to the against the furniture a couple of times. She is covered in bruises on her back shoulders and the right side of her neck. Her falls appear to be related to severe leg weakness and unsteady gait. She has difficulty getting up out of a chair without her husband's assistance. She thinks part of the problem is worsening leg edema which makes it hard to bend her legs. She denies dizziness and has not experienced syncope.  Her blood pressure today is low.  Her weight is identical to her previous appointment 6 months ago and one year ago. On the other hand just looking at her face it appears that she has lost a lot of adipose tissue, it is possible that the wasting the same because she is retaining more fluid.  A couple of months ago she woke up with a severely weakened left upper extremity. She is unable to fully extend her fingers and her grip is very weak. The hand is "of no use". She has had a couple of CTs of the head related to her falls and injuries. These have shown scalp hematoma without intracerebral bleeding or evidence of ischemic stroke. Has also had a CT of the spine which showed a stable grade 1 C2-C3 anterolisthesis.  She is on chronic warfarin anticoagulation for permanent atrial fibrillation. She does not have a history of stroke or other embolic events. She is still not using CPAP. She has moderate pulmonary hypertension which is the substrate for her right heart failure. The only identifiable etiology is sleep apnea. 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 She hates taking diuretics since she feels that this causes severe constipation.  She has mild coronary atherosclerosis by remote cardiac catheterization (occlusion of the ramus intermedius, mild proximal LAD stenosis) and had a low risk nuclear perfusion study in June of 2013. She has normal left ventricular systolic function but her right ventricle is moderately dilated and her left atrium is severely dilated. Estimated PA pressure is 50-60 mm Hg by echocardiography      Allergies  Allergen Reactions  . Codeine Anaphylaxis, Hives and Nausea And Vomiting  . Ciprofloxacin Hives and Nausea And Vomiting  . Latex Hives and Rash  . Penicillins Hives and Rash  . Sulfonamide Derivatives Hives and Rash    Current Outpatient Prescriptions  Medication Sig Dispense Refill  . albuterol (PROVENTIL HFA;VENTOLIN HFA) 108 (90 BASE) MCG/ACT inhaler Inhale 2 puffs into the lungs every 6 (six) hours as needed for shortness of breath.      [name] aspirin EC 81 MG tablet Take 81 mg by mouth every evening.       . Cholecalciferol (VITAMIN D PO) Take 2 tablets by mouth every morning.      . hydrocortisone cream 0.5 % Apply 1 application topically 2 (two) times daily as needed (for itchiness).      [name] levalbuterol (XOPENEX HFA) 45 MCG/ACT inhaler Inhale 1-2 puffs into the lungs every 4 (four) hours as needed for wheezing or shortness of breath.      [name]  levothyroxine (SYNTHROID, LEVOTHROID) 25 MCG tablet Take 25 mcg by mouth every morning.      . metoprolol succinate (TOPROL-XL) 50 MG 24 hr tablet Take 50 mg by mouth daily. Take with or immediately following a meal.      . mometasone (NASONEX) 50 MCG/ACT nasal spray Place 2 sprays into the nose daily as needed (for allergies).      . morphine (MS CONTIN) 60 MG 12 hr tablet Take 60 mg by mouth every evening.       [name] morphine (MSIR) 30 MG tablet Take 30 mg by mouth daily as needed (breakthrough pain morning).       . nitroGLYCERIN (NITROLINGUAL) 0.4 MG/SPRAY  spray Place 1 spray under the tongue every 5 (five) minutes as needed for chest pain.  12 g  1  . potassium chloride (K-DUR) 10 MEQ tablet Take 10-20 mEq by mouth 2 (two) times daily.       . promethazine (PHENERGAN) 25 MG tablet Take 25 mg by mouth every 6 (six) hours as needed for nausea (prn).      . sertraline (ZOLOFT) 100 MG tablet Take 50 mg by mouth every morning.       . torsemide (DEMADEX) 20 MG tablet Take 20-40 mg by mouth 2 (two) times daily.       [name] zolpidem (AMBIEN CR) 6.25 MG CR tablet Take 3.125-6.25 mg by mouth at bedtime as needed for sleep.       No current facility-administered medications for this visit.    Past Medical History  Diagnosis Date  . Anxiety   . Arthritis   . COPD (chronic obstructive pulmonary disease)   . Emphysema of lung   . Tuberculosis   . MI (myocardial infarction) 1999    Unstable angina  . Lung nodule   . Diverticulosis of colon   . Depression   . Milroy's disease     Lymphatic disorder diagnosed at [facility]  . Chronic anticoagulation     A fib. On Coumadin.  . IBS (irritable bowel syndrome)   . Edema of both legs     Venous Doppler 05/11/12 was normal & no evidence of thrombus or thrombophlebitis. Chronic LE edema related to both cor pulmonale & lymphedema, hyperlipidemia & HTN involving coronary atherosclerosis by cardiac cath in 2004. Cath showed a tiny old occlusion of the optional diagonal branch & also proximal LAD.  [name] Chronic pain syndrome   . Hyperlipidemia     On a statin. Catheterization 02/12/03 Showed a tiny old occlusion of the optional diagonal branch & also proximal LAD.  [name] Hypertension     Myoview 07/2011 EF = >55%. RV was moderately dilated. LA was severely dilated. Catheterization 02/12/03 Showed a tiny old occlusion of the optional diagonal branch & also proximal LAD.  [name] Coronary artery disease   . Coronary atherosclerosis     Mild. Myoview 07/2011 EF = >55%. RV was moderately dilated. LA was severely dilated.  Catheterization 02/12/03 Showed a tiny old occlusion of the optional diagonal branch & also proximal LAD.  [name] Chronic cor pulmonale     Most likely due to COPD (cannot rule out contribution of diastolic LV dysfunction). ECHO 07/30/11 pulmonary artery pressure was estimated to be 50-60% mmHg & there was moderate tricuspid insufficiency.  . Frequent falls     Possibly due to orthostatic hypotension but we have not seen that on our visits. Recent fall 05/2012 with scalp hematoma, swollen left LE, & black eyes.  . Obstructive  sleep apnea     Polysomnogram 09/25/11 AHI: 53.6/hr overall & 57.1/hr during REM sleep. Poor compliance previously with CPAP  . Pulmonary hypertension     Pullmonary hypertension, chronic cor pulmonale w/pulmonary artery pressures of 50&#x2011; 60 mmHg.  . CHF (congestive heart failure)     Acute on chronic, diastolic, improved. Predominantly right heart failure due to cor pulmonale & in turn this is most likely due to untreated OSA.  [name] Dyspnea on exertion     Reluctant to take diuretics  . Atrial fibrillation, chronic     On Coumadin & a beta-blocker  . Cancer     Carcinoma of sigmoid colon [place].  [name] Bowel obstruction     Caused by scar tissue  . Hypothyroidism 2002    On hormone replacement therapy  . Asthma     On nebulizer  . Insomnia     Past Surgical History  Procedure Laterality Date  . Appendectomy  1956  . Abdominal hysterectomy  1983  . Colon resection      2 times  . Cholecystectomy  1984  . Colon biopsy    . Cardiac catheterization  02/12/03    Showed a tiny old occlusion of the optional diagonal branch & also proximal LAD.  [name] Colonoscopy  07/23/10    Family History  Problem Relation Age of Onset  . Heart disease Father   . Alzheimer's disease Mother     History   Social History  . Marital Status: Married    Spouse Name: N/A    Number of Children: N/A  . Years of Education: N/A   Occupational History  . Not on file.   Social  History Main Topics  . Smoking status: Never Smoker   . Smokeless tobacco: Never Used  . Alcohol Use: No  . Drug Use: No  . Sexual Activity: No   Other Topics Concern  . Not on file   Social History Narrative  . No narrative on file    Review of systems: She is very sedentary and is rarely short of breath at rest. She denies dizziness or syncope. She has extreme lower extremity weakness, unable to stand without assistance. Weak left hand. The patient specifically denies any chest pain at rest or with exertion, dyspnea at rest or with exertion, orthopnea, paroxysmal nocturnal dyspnea, syncope, palpitations, focal neurological deficits, intermittent claudication, lower extremity edema, unexplained weight gain, cough, hemoptysis or wheezing.  The patient also denies abdominal pain, nausea, vomiting, dysphagia, diarrhea, constipation, polyuria, polydipsia, dysuria, hematuria, frequency, urgency, abnormal bleeding or bruising, fever, chills, unexpected weight changes, mood swings, change in skin or hair texture, change in voice quality, auditory or visual problems, allergic reactions or rashes, new musculoskeletal complaints other than usual "aches and pains".   PHYSICAL EXAM BP 102/56  Pulse 64  Resp 20  Ht 5\' 5"  (1.651 m)  Wt 195 lb 8 oz (88.678 kg)  BMI 32.53 kg/m2 General: Alert, oriented x3, no distress  Head: Hematoma behind the  mastoid extending to her external neck surface, PERRL, EOMI, no exophtalmos or lid lag, no myxedema, no xanthelasma; normal ears, nose and oropharynx  Neck: 6-7 cm elevation in jugular venous pulsations and no hepatojugular reflux; brisk carotid pulses without delay and no carotid bruits  Chest: clear to auscultation, no signs of consolidation by percussion or palpation, normal fremitus, symmetrical and full respiratory excursions . Multiple bruises on both shoulders and her back and sides Cardiovascular: normal position and quality of the  apical impulse,  irregular rhythm, normal first and second heart sounds, no murmurs, rubs or gallops  Abdomen: no tenderness or distention, no masses by palpation, no abnormal pulsatility or arterial bruits, normal bowel sounds, no hepatosplenomegaly  Extremities: no clubbing, cyanosis; there is 4+ bilateral hard pitting edema of the feet, calves and entire length of her thighs, as well as mild abdominal wall edema; 2+ radial, ulnar and brachial pulses bilaterally; cannot palpate any pulses in the lower extremities below the femorals secondary to the edema; no subclavian or femoral bruits  Neurological: There does appear to be some weakening in the extensors of her left upper extremity but her grip is fairly good. Her neurological exam is otherwise nonfocal   EKG: Atrial fibrillation no repolarization abnormalities  Lipid Panel     Component Value Date/Time   CHOL  Value: 105        ATP III CLASSIFICATION:  <200     mg/dL   Desirable  200-239  mg/dL   Borderline High  >=240    mg/dL   High        03/12/2008 0400   TRIG 73 03/12/2008 0400   HDL 35* 03/12/2008 0400   CHOLHDL 3.0 03/12/2008 0400   VLDL 15 03/12/2008 0400   LDLCALC  Value: 55        Total Cholesterol/HDL:CHD Risk Coronary Heart Disease Risk Table                     Men   Women  1/2 Average Risk   3.4   3.3  Average Risk       5.0   4.4  2 X Average Risk   9.6   7.1  3 X Average Risk  23.4   11.0        Use the calculated Patient Ratio above and the CHD Risk Table to determine the patient's CHD Risk.        ATP III CLASSIFICATION (LDL):  <100     mg/dL   Optimal  100-129  mg/dL   Near or Above                    Optimal  130-159  mg/dL   Borderline  160-189  mg/dL   High  >190     mg/dL   Very High 03/12/2008 0400    BMET    Component Value Date/Time   NA 138 03/08/2013 0100   K 4.6 03/08/2013 0100   CL 96 03/08/2013 0100   CO2 30 03/08/2013 0100   GLUCOSE 95 03/08/2013 0100   BUN 48* 03/08/2013 0100   CREATININE 1.56* 03/08/2013 0100   CALCIUM 10.3  03/08/2013 0100   GFRNONAA 31* 03/08/2013 0100   GFRAA 36* 03/08/2013 0100     ASSESSMENT AND PLAN  I am very concerned about based frequent falls and thinks she is at risk for serious injury while on warfarin. I recommended that she stop the warfarin at least temporarily. She expressed her concern that this may be complicated by a stroke. I told her that at this point I think the risk of a serious injury with epidural or subdural hematoma at least matches the risk of an embolic event. We will restart chronic anticoagulation if we can stop her falls.  Although her falls do not appear to be directly related to hypotension, her blood pressure is low and I've recommended that she discontinue lisinopril and isosorbide. I think she has clear evidence  of hypervolemia and I would like to give her more diuretics, but will delay this until her blood pressure improves and we clarified the reason for her falls.   I've also recommended that she stop her statin as this may be causing muscle weakness (she has been on this medication however for many many years without side effects). Her left upper extremity weakness is unusual and I wonder whether she has some type of neuromuscular disorder. Have recommended that she see a neurologist and they expressed their wish to see Dr. Brett Fairy, who is Mr. Swartz's neurologist. We made the referral.  Orders Placed This Encounter  Procedures  . Ambulatory referral to Neurology  . EKG 12-Lead  . 2D Echocardiogram without contrast   No orders of the defined types were placed in this encounter.    Rhylan Gross  Sanda Klein, MD, [facility] [phone] office [phone] pager

## 2013-06-01 ENCOUNTER — Telehealth (HOSPITAL_COMMUNITY): Payer: Self-pay | Admitting: *Deleted

## 2013-06-07 ENCOUNTER — Inpatient Hospital Stay (HOSPITAL_COMMUNITY)
Admission: EM | Admit: 2013-06-07 | Discharge: 2013-06-11 | DRG: 603 | Disposition: A | Discharge status: Home / Self Care | Payer: Medicare Other | Attending: Internal Medicine | Admitting: Internal Medicine

## 2013-06-07 DIAGNOSIS — R930 Abnormal findings on diagnostic imaging of skull and head, not elsewhere classified: Secondary | ICD-10-CM | POA: Diagnosis not present

## 2013-06-07 DIAGNOSIS — I10 Essential (primary) hypertension: Secondary | ICD-10-CM | POA: Diagnosis present

## 2013-06-07 DIAGNOSIS — Z79899 Other long term (current) drug therapy: Secondary | ICD-10-CM

## 2013-06-07 DIAGNOSIS — D649 Anemia, unspecified: Secondary | ICD-10-CM | POA: Diagnosis present

## 2013-06-07 DIAGNOSIS — J449 Chronic obstructive pulmonary disease, unspecified: Secondary | ICD-10-CM | POA: Diagnosis present

## 2013-06-07 DIAGNOSIS — F3289 Other specified depressive episodes: Secondary | ICD-10-CM | POA: Diagnosis present

## 2013-06-07 DIAGNOSIS — Z9181 History of falling: Secondary | ICD-10-CM

## 2013-06-07 DIAGNOSIS — K5900 Constipation, unspecified: Secondary | ICD-10-CM

## 2013-06-07 DIAGNOSIS — Z85038 Personal history of other malignant neoplasm of large intestine: Secondary | ICD-10-CM | POA: Diagnosis not present

## 2013-06-07 DIAGNOSIS — F329 Major depressive disorder, single episode, unspecified: Secondary | ICD-10-CM | POA: Diagnosis present

## 2013-06-07 DIAGNOSIS — L03113 Cellulitis of right upper limb: Secondary | ICD-10-CM | POA: Diagnosis present

## 2013-06-07 DIAGNOSIS — G4733 Obstructive sleep apnea (adult) (pediatric): Secondary | ICD-10-CM | POA: Diagnosis present

## 2013-06-07 DIAGNOSIS — R5381 Other malaise: Secondary | ICD-10-CM | POA: Diagnosis not present

## 2013-06-07 DIAGNOSIS — E785 Hyperlipidemia, unspecified: Secondary | ICD-10-CM | POA: Diagnosis present

## 2013-06-07 DIAGNOSIS — I252 Old myocardial infarction: Secondary | ICD-10-CM | POA: Diagnosis not present

## 2013-06-07 DIAGNOSIS — E039 Hypothyroidism, unspecified: Secondary | ICD-10-CM | POA: Diagnosis present

## 2013-06-07 DIAGNOSIS — M47812 Spondylosis without myelopathy or radiculopathy, cervical region: Secondary | ICD-10-CM | POA: Diagnosis not present

## 2013-06-07 DIAGNOSIS — G894 Chronic pain syndrome: Secondary | ICD-10-CM | POA: Diagnosis present

## 2013-06-07 DIAGNOSIS — G4700 Insomnia, unspecified: Secondary | ICD-10-CM | POA: Diagnosis present

## 2013-06-07 DIAGNOSIS — I272 Pulmonary hypertension, unspecified: Secondary | ICD-10-CM

## 2013-06-07 DIAGNOSIS — R29898 Other symptoms and signs involving the musculoskeletal system: Secondary | ICD-10-CM | POA: Diagnosis not present

## 2013-06-07 DIAGNOSIS — I2789 Other specified pulmonary heart diseases: Secondary | ICD-10-CM | POA: Diagnosis present

## 2013-06-07 DIAGNOSIS — M5030 Other cervical disc degeneration, unspecified cervical region: Secondary | ICD-10-CM | POA: Diagnosis not present

## 2013-06-07 DIAGNOSIS — L03119 Cellulitis of unspecified part of limb: Principal | ICD-10-CM

## 2013-06-07 DIAGNOSIS — R609 Edema, unspecified: Secondary | ICD-10-CM | POA: Diagnosis not present

## 2013-06-07 DIAGNOSIS — Z9119 Patient's noncompliance with other medical treatment and regimen: Secondary | ICD-10-CM | POA: Diagnosis not present

## 2013-06-07 DIAGNOSIS — M7989 Other specified soft tissue disorders: Secondary | ICD-10-CM | POA: Diagnosis not present

## 2013-06-07 DIAGNOSIS — F411 Generalized anxiety disorder: Secondary | ICD-10-CM | POA: Diagnosis present

## 2013-06-07 DIAGNOSIS — I50811 Acute right heart failure: Secondary | ICD-10-CM

## 2013-06-07 DIAGNOSIS — I509 Heart failure, unspecified: Secondary | ICD-10-CM

## 2013-06-07 DIAGNOSIS — L02519 Cutaneous abscess of unspecified hand: Principal | ICD-10-CM | POA: Diagnosis present

## 2013-06-07 DIAGNOSIS — K589 Irritable bowel syndrome without diarrhea: Secondary | ICD-10-CM | POA: Diagnosis present

## 2013-06-07 DIAGNOSIS — Z7982 Long term (current) use of aspirin: Secondary | ICD-10-CM | POA: Diagnosis not present

## 2013-06-07 DIAGNOSIS — R296 Repeated falls: Secondary | ICD-10-CM

## 2013-06-07 DIAGNOSIS — I2510 Atherosclerotic heart disease of native coronary artery without angina pectoris: Secondary | ICD-10-CM | POA: Diagnosis present

## 2013-06-07 DIAGNOSIS — Q820 Hereditary lymphedema: Secondary | ICD-10-CM | POA: Diagnosis not present

## 2013-06-07 DIAGNOSIS — R209 Unspecified disturbances of skin sensation: Secondary | ICD-10-CM | POA: Diagnosis not present

## 2013-06-07 DIAGNOSIS — I6789 Other cerebrovascular disease: Secondary | ICD-10-CM | POA: Diagnosis not present

## 2013-06-07 DIAGNOSIS — D696 Thrombocytopenia, unspecified: Secondary | ICD-10-CM | POA: Diagnosis present

## 2013-06-07 DIAGNOSIS — I4891 Unspecified atrial fibrillation: Secondary | ICD-10-CM | POA: Diagnosis not present

## 2013-06-07 DIAGNOSIS — R141 Gas pain: Secondary | ICD-10-CM | POA: Diagnosis not present

## 2013-06-07 DIAGNOSIS — J438 Other emphysema: Secondary | ICD-10-CM | POA: Diagnosis present

## 2013-06-07 DIAGNOSIS — Z91199 Patient's noncompliance with other medical treatment and regimen due to unspecified reason: Secondary | ICD-10-CM | POA: Diagnosis not present

## 2013-06-07 DIAGNOSIS — T304 Corrosion of unspecified body region, unspecified degree: Secondary | ICD-10-CM | POA: Diagnosis present

## 2013-06-07 DIAGNOSIS — R5383 Other fatigue: Secondary | ICD-10-CM | POA: Diagnosis not present

## 2013-06-07 DIAGNOSIS — R600 Localized edema: Secondary | ICD-10-CM | POA: Diagnosis present

## 2013-06-07 DIAGNOSIS — M19049 Primary osteoarthritis, unspecified hand: Secondary | ICD-10-CM | POA: Diagnosis not present

## 2013-06-07 DIAGNOSIS — R918 Other nonspecific abnormal finding of lung field: Secondary | ICD-10-CM | POA: Diagnosis not present

## 2013-06-07 NOTE — ED Notes (Signed)
Pt has multiple complaints, pt has been unable to urinate x 3 days, and has not had a bowel movement x 3wks, EMS also noticed pt has pitting edema in back and abd, as well as legs. Pt states edema has been going on for months. Pt has had increased falls. Hx of CHF, has had lasix dosage changed and stopped coumadin.

## 2013-06-07 NOTE — Telephone Encounter (Signed)
Encounter closed---TP 06/07/13 

## 2013-06-08 ENCOUNTER — Emergency Department (HOSPITAL_COMMUNITY): Payer: Medicare Other

## 2013-06-08 ENCOUNTER — Encounter (HOSPITAL_COMMUNITY): Payer: Self-pay | Admitting: Emergency Medicine

## 2013-06-08 DIAGNOSIS — M19049 Primary osteoarthritis, unspecified hand: Secondary | ICD-10-CM | POA: Diagnosis not present

## 2013-06-08 DIAGNOSIS — L02519 Cutaneous abscess of unspecified hand: Secondary | ICD-10-CM | POA: Diagnosis not present

## 2013-06-08 DIAGNOSIS — I509 Heart failure, unspecified: Secondary | ICD-10-CM

## 2013-06-08 DIAGNOSIS — I4891 Unspecified atrial fibrillation: Secondary | ICD-10-CM | POA: Diagnosis not present

## 2013-06-08 DIAGNOSIS — R29898 Other symptoms and signs involving the musculoskeletal system: Secondary | ICD-10-CM

## 2013-06-08 DIAGNOSIS — R609 Edema, unspecified: Secondary | ICD-10-CM | POA: Diagnosis not present

## 2013-06-08 DIAGNOSIS — R918 Other nonspecific abnormal finding of lung field: Secondary | ICD-10-CM | POA: Diagnosis not present

## 2013-06-08 DIAGNOSIS — L03113 Cellulitis of right upper limb: Secondary | ICD-10-CM | POA: Diagnosis present

## 2013-06-08 DIAGNOSIS — K5900 Constipation, unspecified: Secondary | ICD-10-CM

## 2013-06-08 DIAGNOSIS — I272 Pulmonary hypertension, unspecified: Secondary | ICD-10-CM | POA: Diagnosis present

## 2013-06-08 DIAGNOSIS — I50811 Acute right heart failure: Secondary | ICD-10-CM | POA: Insufficient documentation

## 2013-06-08 DIAGNOSIS — L03119 Cellulitis of unspecified part of limb: Principal | ICD-10-CM

## 2013-06-08 DIAGNOSIS — I2789 Other specified pulmonary heart diseases: Secondary | ICD-10-CM

## 2013-06-08 DIAGNOSIS — T300 Burn of unspecified body region, unspecified degree: Secondary | ICD-10-CM

## 2013-06-08 DIAGNOSIS — T304 Corrosion of unspecified body region, unspecified degree: Secondary | ICD-10-CM | POA: Diagnosis present

## 2013-06-08 DIAGNOSIS — IMO0002 Reserved for concepts with insufficient information to code with codable children: Secondary | ICD-10-CM

## 2013-06-08 LAB — BASIC METABOLIC PANEL
BUN: 34 mg/dL — ABNORMAL HIGH (ref 6–23)
CHLORIDE: 99 meq/L (ref 96–112)
CO2: 29 meq/L (ref 19–32)
CREATININE: 0.93 mg/dL (ref 0.50–1.10)
Calcium: 10 mg/dL (ref 8.4–10.5)
GFR calc Af Amer: 67 mL/min — ABNORMAL LOW (ref >90)
GFR calc non Af Amer: 58 mL/min — ABNORMAL LOW (ref >90)
GLUCOSE: 89 mg/dL (ref 70–99)
Potassium: 3.6 mEq/L — ABNORMAL LOW (ref 3.7–5.3)
Sodium: 139 mEq/L (ref 137–147)

## 2013-06-08 LAB — CBC WITH DIFFERENTIAL/PLATELET
Basophils Absolute: 0 10*3/uL (ref 0.0–0.1)
Basophils Relative: 0 % (ref 0–1)
EOS PCT: 2 % (ref 0–5)
Eosinophils Absolute: 0.1 10*3/uL (ref 0.0–0.7)
HEMATOCRIT: 37.2 % (ref 36.0–46.0)
Hemoglobin: 11.9 g/dL — ABNORMAL LOW (ref 12.0–15.0)
LYMPHS ABS: 1 10*3/uL (ref 0.7–4.0)
Lymphocytes Relative: 14 % (ref 12–46)
MCH: 29 pg (ref 26.0–34.0)
MCHC: 32 g/dL (ref 30.0–36.0)
MCV: 90.5 fL (ref 78.0–100.0)
Monocytes Absolute: 0.6 10*3/uL (ref 0.1–1.0)
Monocytes Relative: 8 % (ref 3–12)
NEUTROS PCT: 76 % (ref 43–77)
Neutro Abs: 5.5 10*3/uL (ref 1.7–7.7)
Platelets: 138 10*3/uL — ABNORMAL LOW (ref 150–400)
RBC: 4.11 MIL/uL (ref 3.87–5.11)
RDW: 17 % — ABNORMAL HIGH (ref 11.5–15.5)
WBC: 7.2 10*3/uL (ref 4.0–10.5)

## 2013-06-08 LAB — COMPREHENSIVE METABOLIC PANEL
ALK PHOS: 84 U/L (ref 39–117)
ALT: 29 U/L (ref 0–35)
AST: 91 U/L — ABNORMAL HIGH (ref 0–37)
Albumin: 3.1 g/dL — ABNORMAL LOW (ref 3.5–5.2)
BUN: 36 mg/dL — ABNORMAL HIGH (ref 6–23)
CO2: 25 mEq/L (ref 19–32)
Calcium: 10.3 mg/dL (ref 8.4–10.5)
Chloride: 97 mEq/L (ref 96–112)
Creatinine, Ser: 0.94 mg/dL (ref 0.50–1.10)
GFR calc non Af Amer: 57 mL/min — ABNORMAL LOW (ref >90)
GFR, EST AFRICAN AMERICAN: 66 mL/min — AB (ref >90)
Glucose, Bld: 89 mg/dL (ref 70–99)
POTASSIUM: 3.7 meq/L (ref 3.7–5.3)
Sodium: 137 mEq/L (ref 137–147)
TOTAL PROTEIN: 7 g/dL (ref 6.0–8.3)
Total Bilirubin: 1.8 mg/dL — ABNORMAL HIGH (ref 0.3–1.2)

## 2013-06-08 LAB — I-STAT CHEM 8, ED
BUN: 34 mg/dL — AB (ref 6–23)
CALCIUM ION: 1.32 mmol/L — AB (ref 1.13–1.30)
CHLORIDE: 96 meq/L (ref 96–112)
Creatinine, Ser: 1 mg/dL (ref 0.50–1.10)
GLUCOSE: 91 mg/dL (ref 70–99)
HCT: 37 % (ref 36.0–46.0)
Hemoglobin: 12.6 g/dL (ref 12.0–15.0)
Potassium: 3.3 mEq/L — ABNORMAL LOW (ref 3.7–5.3)
Sodium: 138 mEq/L (ref 137–147)
TCO2: 24 mmol/L (ref 0–100)

## 2013-06-08 LAB — C-REACTIVE PROTEIN: CRP: 1.8 mg/dL — ABNORMAL HIGH (ref <0.60)

## 2013-06-08 LAB — SEDIMENTATION RATE: Sed Rate: 18 mm/hr (ref 0–22)

## 2013-06-08 LAB — URINALYSIS, ROUTINE W REFLEX MICROSCOPIC
BILIRUBIN URINE: NEGATIVE
Glucose, UA: NEGATIVE mg/dL
Hgb urine dipstick: NEGATIVE
KETONES UR: NEGATIVE mg/dL
Leukocytes, UA: NEGATIVE
NITRITE: NEGATIVE
Protein, ur: NEGATIVE mg/dL
SPECIFIC GRAVITY, URINE: 1.02 (ref 1.005–1.030)
UROBILINOGEN UA: 1 mg/dL (ref 0.0–1.0)
pH: 5.5 (ref 5.0–8.0)

## 2013-06-08 LAB — PROTIME-INR
INR: 1.45 (ref 0.00–1.49)
Prothrombin Time: 17.3 seconds — ABNORMAL HIGH (ref 11.6–15.2)

## 2013-06-08 LAB — GLUCOSE, CAPILLARY
GLUCOSE-CAPILLARY: 82 mg/dL (ref 70–99)
Glucose-Capillary: 81 mg/dL (ref 70–99)
Glucose-Capillary: 83 mg/dL (ref 70–99)
Glucose-Capillary: 98 mg/dL (ref 70–99)

## 2013-06-08 LAB — PRO B NATRIURETIC PEPTIDE: Pro B Natriuretic peptide (BNP): 4547 pg/mL — ABNORMAL HIGH (ref 0–450)

## 2013-06-08 LAB — TROPONIN I
Troponin I: <0.3 ng/mL (ref <0.30)
Troponin I: <0.3 ng/mL (ref <0.30)

## 2013-06-08 LAB — TSH: TSH: 21.5 u[IU]/mL — ABNORMAL HIGH (ref 0.350–4.500)

## 2013-06-08 MED ORDER — SENNOSIDES-DOCUSATE SODIUM 8.6-50 MG PO TABS
2.0000 | ORAL_TABLET | Freq: Two times a day (BID) | ORAL | Status: DC
Start: 2013-06-08 — End: 2013-06-11
  Administered 2013-06-08 – 2013-06-11 (×7): 2 via ORAL
  Filled 2013-06-08 (×8): qty 2

## 2013-06-08 MED ORDER — MAGNESIUM HYDROXIDE 400 MG/5ML PO SUSP
30.0000 mL | Freq: Once | ORAL | Status: AC
  Administered 2013-06-08: 30 mL via ORAL
  Filled 2013-06-08: qty 30

## 2013-06-08 MED ORDER — METOPROLOL SUCCINATE ER 50 MG PO TB24
50.0000 mg | ORAL_TABLET | Freq: Every day | ORAL | Status: DC
  Administered 2013-06-08 – 2013-06-11 (×4): 50 mg via ORAL
  Filled 2013-06-08 (×4): qty 1

## 2013-06-08 MED ORDER — ACETAMINOPHEN 325 MG PO TABS: 650.0000 mg | ORAL_TABLET | Freq: Four times a day (QID) | ORAL | Status: DC | PRN

## 2013-06-08 MED ORDER — TORSEMIDE 20 MG PO TABS
40.0000 mg | ORAL_TABLET | Freq: Two times a day (BID) | ORAL | Status: DC
  Administered 2013-06-08 – 2013-06-11 (×7): 40 mg via ORAL
  Filled 2013-06-08 (×10): qty 2

## 2013-06-08 MED ORDER — LEVOTHYROXINE SODIUM 75 MCG PO TABS
37.5000 ug | ORAL_TABLET | Freq: Every morning | ORAL | Status: DC
  Administered 2013-06-09 – 2013-06-11 (×3): 37.5 ug via ORAL
  Filled 2013-06-08 (×3): qty 0.5

## 2013-06-08 MED ORDER — NITROGLYCERIN 0.4 MG/SPRAY TL SOLN: 1.0000 | Status: DC | PRN

## 2013-06-08 MED ORDER — VANCOMYCIN HCL IN DEXTROSE 1-5 GM/200ML-% IV SOLN
1000.0000 mg | INTRAVENOUS | Status: DC
  Administered 2013-06-09 – 2013-06-10 (×2): 1000 mg via INTRAVENOUS
  Filled 2013-06-08 (×3): qty 200

## 2013-06-08 MED ORDER — POTASSIUM CHLORIDE CRYS ER 20 MEQ PO TBCR
20.0000 meq | EXTENDED_RELEASE_TABLET | Freq: Two times a day (BID) | ORAL | Status: DC
  Administered 2013-06-08 – 2013-06-11 (×7): 20 meq via ORAL
  Filled 2013-06-08 (×8): qty 1

## 2013-06-08 MED ORDER — MORPHINE SULFATE ER 30 MG PO TBCR
60.0000 mg | EXTENDED_RELEASE_TABLET | Freq: Two times a day (BID) | ORAL | Status: DC
  Administered 2013-06-08 – 2013-06-11 (×6): 60 mg via ORAL
  Filled 2013-06-08 (×6): qty 2

## 2013-06-08 MED ORDER — ONDANSETRON HCL 4 MG/2ML IJ SOLN: 4.0000 mg | Freq: Four times a day (QID) | INTRAMUSCULAR | Status: DC | PRN

## 2013-06-08 MED ORDER — ONDANSETRON HCL 4 MG PO TABS: 4.0000 mg | ORAL_TABLET | Freq: Four times a day (QID) | ORAL | Status: DC | PRN

## 2013-06-08 MED ORDER — LEVOTHYROXINE SODIUM 25 MCG PO TABS
25.0000 ug | ORAL_TABLET | Freq: Every morning | ORAL | Status: DC
  Administered 2013-06-08: 25 ug via ORAL
  Filled 2013-06-08: qty 1

## 2013-06-08 MED ORDER — SODIUM CHLORIDE 0.9 % IJ SOLN
3.0000 mL | Freq: Two times a day (BID) | INTRAMUSCULAR | Status: DC
  Administered 2013-06-09 – 2013-06-11 (×6): 3 mL via INTRAVENOUS

## 2013-06-08 MED ORDER — VANCOMYCIN HCL IN DEXTROSE 1-5 GM/200ML-% IV SOLN
1000.0000 mg | Freq: Once | INTRAVENOUS | Status: AC
  Administered 2013-06-08: 1000 mg via INTRAVENOUS
  Filled 2013-06-08: qty 200

## 2013-06-08 MED ORDER — ACETAMINOPHEN 650 MG RE SUPP: 650.0000 mg | Freq: Four times a day (QID) | RECTAL | Status: DC | PRN

## 2013-06-08 MED ORDER — BISACODYL 10 MG RE SUPP: 10.0000 mg | Freq: Every day | RECTAL | Status: DC | PRN

## 2013-06-08 MED ORDER — HEPARIN SODIUM (PORCINE) 5000 UNIT/ML IJ SOLN
5000.0000 [IU] | Freq: Three times a day (TID) | INTRAMUSCULAR | Status: DC
  Administered 2013-06-08 – 2013-06-11 (×8): 5000 [IU] via SUBCUTANEOUS
  Filled 2013-06-08 (×11): qty 1

## 2013-06-08 MED ORDER — ZOLPIDEM TARTRATE 5 MG PO TABS
5.0000 mg | ORAL_TABLET | Freq: Every evening | ORAL | Status: DC | PRN
Start: 2013-06-08 — End: 2013-06-11

## 2013-06-08 MED ORDER — ALBUTEROL SULFATE (2.5 MG/3ML) 0.083% IN NEBU: 3.0000 mL | INHALATION_SOLUTION | Freq: Four times a day (QID) | RESPIRATORY_TRACT | Status: DC | PRN

## 2013-06-08 MED ORDER — MORPHINE SULFATE 15 MG PO TABS: 30.0000 mg | ORAL_TABLET | Freq: Every day | ORAL | Status: DC | PRN

## 2013-06-08 MED ORDER — POLYETHYLENE GLYCOL 3350 17 G PO PACK
17.0000 g | PACK | Freq: Every day | ORAL | Status: DC
  Administered 2013-06-08 – 2013-06-11 (×4): 17 g via ORAL
  Filled 2013-06-08 (×4): qty 1

## 2013-06-08 MED ORDER — ASPIRIN EC 81 MG PO TBEC
81.0000 mg | DELAYED_RELEASE_TABLET | Freq: Every evening | ORAL | Status: DC
  Administered 2013-06-08 – 2013-06-10 (×3): 81 mg via ORAL
  Filled 2013-06-08 (×5): qty 1

## 2013-06-08 NOTE — Progress Notes (Signed)
ANTIBIOTIC CONSULT NOTE - INITIAL  Pharmacy Consult for Vancomycin Indication: cellulitis  Allergies  Allergen Reactions  . Codeine Anaphylaxis, Hives and Nausea And Vomiting  . Ciprofloxacin Hives and Nausea And Vomiting  . Latex Hives and Rash  . Penicillins Hives and Rash  . Sulfonamide Derivatives Hives and Rash    Patient Measurements: Height: 5\' 5"  (165.1 cm) Weight: 189 lb 3.2 oz (85.821 kg) IBW/kg (Calculated) : 57  Vital Signs: Temp: 97.7 F (36.5 C) (04/23 0026) Temp src: Oral (04/23 0026) BP: 118/68 mmHg (04/23 0501) Pulse Rate: 70 (04/23 0026) Intake/Output from previous day: 04/22 0701 - 04/23 0700 In: -  Out: 150 [Urine:150] Intake/Output from this shift: Total I/O In: -  Out: 150 [Urine:150]  Labs:  Recent Labs  06/08/13 0032 06/08/13 0337  WBC 7.2  --   HGB 11.9* 12.6  PLT 138*  --   CREATININE 0.94 1.00   Estimated Creatinine Clearance: 50.9 ml/min (by C-G formula based on Cr of 1). No results found for this basename: VANCOTROUGH, VANCOPEAK, VANCORANDOM, GENTTROUGH, GENTPEAK, GENTRANDOM, TOBRATROUGH, TOBRAPEAK, TOBRARND, AMIKACINPEAK, AMIKACINTROU, AMIKACIN,  in the last 72 hours   Microbiology: No results found for this or any previous visit (from the past 720 hour(s)).  Medical History: Past Medical History  Diagnosis Date  . Anxiety   . Arthritis   . COPD (chronic obstructive pulmonary disease)   . Emphysema of lung   . Tuberculosis   . MI (myocardial infarction) 1999    Unstable angina  . Lung nodule   . Diverticulosis of colon   . Depression   . Milroy's disease     Lymphatic [facility] at [facility]  . Chronic anticoagulation     A fib. On Coumadin.  . IBS (irritable bowel syndrome)   . Edema of both legs     Venous Doppler 05/11/12 was normal & no evidence of thrombus or thrombophlebitis. Chronic LE edema related to both cor pulmonale & lymphedema, hyperlipidemia & HTN involving coronary atherosclerosis by cardiac cath in  2004. Cath showed a tiny old occlusion of the optional diagonal branch & also proximal LAD.  [name] Chronic pain syndrome   . Hyperlipidemia     On a statin. Catheterization 02/12/03 Showed a tiny old occlusion of the optional diagonal branch & also proximal LAD.  [name] Hypertension     Myoview 07/2011 EF = >55%. RV was moderately dilated. LA was severely dilated. Catheterization 02/12/03 Showed a tiny old occlusion of the optional diagonal branch & also proximal LAD.  [name] Coronary artery disease   . Coronary atherosclerosis     Mild. Myoview 07/2011 EF = >55%. RV was moderately dilated. LA was severely dilated. Catheterization 02/12/03 Showed a tiny old occlusion of the optional diagonal branch & also proximal LAD.  [name] Chronic cor pulmonale     Most likely due to COPD (cannot rule out contribution of diastolic LV dysfunction). ECHO 07/30/11 pulmonary artery pressure was estimated to be 50-60% mmHg & there was moderate tricuspid insufficiency.  . Frequent falls     Possibly due to orthostatic hypotension but we have not seen that on our visits. Recent fall 05/2012 with scalp hematoma, swollen left LE, & black eyes.  . Obstructive sleep apnea     Polysomnogram 09/25/11 AHI: 53.6/hr overall & 57.1/hr during REM sleep. Poor compliance previously with CPAP  . Pulmonary hypertension     Pullmonary hypertension, chronic cor pulmonale w/pulmonary artery pressures of 50&#x2011; 60 mmHg.  . CHF (congestive heart failure)  Acute on chronic, diastolic, improved. Predominantly right heart failure due to cor pulmonale & in turn this is most likely due to untreated OSA.  [name] Dyspnea on exertion     Reluctant to take diuretics  . Atrial fibrillation, chronic     On Coumadin & a beta-blocker  . Cancer     Carcinoma of sigmoid colon [place].  [name] Bowel obstruction     Caused by scar tissue  . Hypothyroidism 2002    On hormone replacement therapy  . Asthma     On nebulizer  . Insomnia     Medications:   Prescriptions prior to admission  Medication Sig Dispense Refill  . aspirin EC 81 MG tablet Take 81 mg by mouth every evening.       . Cholecalciferol (VITAMIN D PO) Take 2 tablets by mouth every morning.      . hydrocortisone cream 0.5 % Apply 1 application topically 2 (two) times daily as needed (for itchiness).      [name] levothyroxine (SYNTHROID, LEVOTHROID) 25 MCG tablet Take 25 mcg by mouth every morning.      . magnesium hydroxide (PHILLIPS CHEWS) 311 MG CHEW chewable tablet Chew 311 mg by mouth daily as needed.      . metoprolol succinate (TOPROL-XL) 50 MG 24 hr tablet Take 50 mg by mouth daily. Take with or immediately following a meal.      . morphine (MS CONTIN) 60 MG 12 hr tablet Take 60 mg by mouth every 12 (twelve) hours.      [name] morphine (MSIR) 30 MG tablet Take 30 mg by mouth daily as needed (breakthrough pain morning).       . potassium chloride (K-DUR,KLOR-CON) 10 MEQ tablet Take 20 mEq by mouth 2 (two) times daily.      [name] senna (SENOKOT) 8.6 MG tablet Take 2-3 tablets by mouth daily.      [name] torsemide (DEMADEX) 20 MG tablet Take 40 mg by mouth 2 (two) times daily.      [name] zolpidem (AMBIEN CR) 6.25 MG CR tablet Take 6.25 mg by mouth at bedtime as needed for sleep.      [name] albuterol (PROVENTIL HFA;VENTOLIN HFA) 108 (90 BASE) MCG/ACT inhaler Inhale 2 puffs into the lungs every 6 (six) hours as needed for shortness of breath.      . nitroGLYCERIN (NITROLINGUAL) 0.4 MG/SPRAY spray Place 1 spray under the tongue every 5 (five) minutes as needed for chest pain.  12 g  1   Assessment: 79 yo female with hand swelling/cellulitis for empiric antibiotics  Vancomycin 1 g IV given in ED at  0345  Goal of Therapy:  Vancomycin trough level 10-15 mcg/ml  Plan:  Vancomycin 1 g IV q24h  Bronson Curb Tiaja Hagan 06/08/2013,6:54 AM

## 2013-06-08 NOTE — ED Notes (Signed)
Pt took home dosage of morphine. Dr. Kathrynn Humble made aware.

## 2013-06-08 NOTE — Progress Notes (Signed)
\   Echocardiogram 2D Echocardiogram has been performed.  Donata Clay 06/08/2013, 3:22 PM

## 2013-06-08 NOTE — Consult Note (Signed)
NEURO HOSPITALIST CONSULT NOTE    Reason for Consult:frequent falls.  HPI:                                                                                                                                          Victoria Fisher is an 79 y.o. female with history of HTN, hyperlipidemia, CAD, MI, atrial fibrillation off anticoagulation due to frequent falls, COPD, pulmonary hypertension, OSA, noncompliant with CPAP, cor pulmonale, Milroy's disease, A. fib, IBS, chronic pain syndrome, anxiety, and recurrent falls admitted for suspected right hand cellulitis. She expressed that she has a long standing history of falls that had gotten worse in the past year. She said that she is falling at least twice a week " because my legs just got weak". The falls occur without premonitory symptoms and there is not associated loss of consciousness or seizure like activity. No bladder or bowel incontinence. Denies lower back pain but complains of chronic neck discomfort and pain in her legs. No paresthesias lower extremities. Denies difficulty swallowing or speaking, no muscle wasting or fasciculations.  No tremors, shuffling gait, or body rigidity. She reports short term memory declined " only in the past 2 or 3 weeks and is not affecting my capacity to function". Had had unrevealing CT and MRI brain in the past.  Past Medical History  Diagnosis Date  . Anxiety   . Arthritis   . COPD (chronic obstructive pulmonary disease)   . Emphysema of lung   . Tuberculosis   . MI (myocardial infarction) 1999    Unstable angina  . Lung nodule   . Diverticulosis of colon   . Depression   . Milroy's disease     Lymphatic disorder diagnosed at [facility]  . Chronic anticoagulation     A fib. On Coumadin.  . IBS (irritable bowel syndrome)   . Edema of both legs     Venous Doppler 05/11/12 was normal & no evidence of thrombus or thrombophlebitis. Chronic LE edema related to both cor pulmonale &  lymphedema, hyperlipidemia & HTN involving coronary atherosclerosis by cardiac cath in 2004. Cath showed a tiny old occlusion of the optional diagonal branch & also proximal LAD.  [name] Chronic pain syndrome   . Hyperlipidemia     On a statin. Catheterization 02/12/03 Showed a tiny old occlusion of the optional diagonal branch & also proximal LAD.  [name] Hypertension     Myoview 07/2011 EF = >55%. RV was moderately dilated. LA was severely dilated. Catheterization 02/12/03 Showed a tiny old occlusion of the optional diagonal branch & also proximal LAD.  [name] Coronary artery disease   . Coronary atherosclerosis     Mild. Myoview 07/2011 EF = >55%. RV was moderately dilated. LA was severely dilated. Catheterization 02/12/03 Showed a  tiny old occlusion of the optional diagonal branch & also proximal LAD.  [name] Chronic cor pulmonale     Most likely due to COPD (cannot rule out contribution of diastolic LV dysfunction). ECHO 07/30/11 pulmonary artery pressure was estimated to be 50-60% mmHg & there was moderate tricuspid insufficiency.  . Frequent falls     Possibly due to orthostatic hypotension but we have not seen that on our visits. Recent fall 05/2012 with scalp hematoma, swollen left LE, & black eyes.  . Obstructive sleep apnea     Polysomnogram 09/25/11 AHI: 53.6/hr overall & 57.1/hr during REM sleep. Poor compliance previously with CPAP  . Pulmonary hypertension     Pullmonary hypertension, chronic cor pulmonale w/pulmonary artery pressures of 50&#x2011; 60 mmHg.  . CHF (congestive heart failure)     Acute on chronic, diastolic, improved. Predominantly right heart failure due to cor pulmonale & in turn this is most likely due to untreated OSA.  [name] Dyspnea on exertion     Reluctant to take diuretics  . Atrial fibrillation, chronic     On Coumadin & a beta-blocker  . Cancer     Carcinoma of sigmoid colon [place].  [name] Bowel obstruction     Caused by scar tissue  . Hypothyroidism 2002    On hormone  replacement therapy  . Asthma     On nebulizer  . Insomnia     Past Surgical History  Procedure Laterality Date  . Appendectomy  1956  . Abdominal hysterectomy  1983  . Colon resection      2 times  . Cholecystectomy  1984  . Colon biopsy    . Cardiac catheterization  02/12/03    Showed a tiny old occlusion of the optional diagonal branch & also proximal LAD.  [name] Colonoscopy  07/23/10    Family History  Problem Relation Age of Onset  . Heart disease Father   . Alzheimer's disease Mother     Social History:  reports that she has never smoked. She has never used smokeless tobacco. She reports that she does not drink alcohol or use illicit drugs.  Allergies  Allergen Reactions  . Codeine Anaphylaxis, Hives and Nausea And Vomiting  . Ciprofloxacin Hives and Nausea And Vomiting  . Latex Hives and Rash  . Penicillins Hives and Rash  . Sulfonamide Derivatives Hives and Rash    MEDICATIONS:                                                                                                                     I have reviewed the patient's current medications.   ROS:  History obtained from the patient and chart review  General ROS: negative for - chills, fever, night sweats, or weight loss Psychological ROS: negative for - behavioral disorder, hallucinations, or suicidal ideation Ophthalmic ROS: negative for - blurry vision, double vision, eye pain or loss of vision ENT ROS: negative for - epistaxis, nasal discharge, oral lesions, sore throat, tinnitus or vertigo Allergy and Immunology ROS: negative for - hives or itchy/watery eyes Hematological and Lymphatic ROS: negative for - bleeding problems, bruising or swollen lymph nodes Endocrine ROS: negative for - galactorrhea, hair pattern changes, polydipsia/polyuria or temperature intolerance Respiratory  ROS: negative for - cough, hemoptysis, and positive for shortness of breath  Cardiovascular ROS: negative for - chest pain, dyspnea on exertion, edema or irregular heartbeat Gastrointestinal ROS: negative for - abdominal pain, diarrhea, hematemesis, nausea/vomiting or stool incontinence Genito-Urinary ROS: negative for - dysuria, hematuria, incontinence or urinary frequency/urgency Musculoskeletal ROS: significant for - joint swelling right wrist and ankles Neurological ROS: as noted in HPI Dermatological ROS: significant for bilateral hand redness.   Physical exam: pleasant female in no apparent distress. Blood pressure 116/60, pulse 76, temperature 97.7 F (36.5 C), temperature source Oral, resp. rate 18, height 5' 5"  (1.651 m), weight 85.821 kg (189 lb 3.2 oz), SpO2 100.00%. Head: normocephalic. Neck: supple, no bruits, no JVD. Cardiac: no murmurs. Lungs: clear. Abdomen: soft, no tender, no mass. Extremities: Bilateral pedal edema with redness.  Neurologic Examination:                                                                                                      Mental Status: Alert, oriented, thought content appropriate.  Speech fluent without evidence of aphasia.  Able to follow 3 step commands without difficulty. Cranial Nerves: II: Discs flat bilaterally; Visual fields grossly normal, pupils equal, round, reactive to light and accommodation III,IV, VI: ptosis not present, extra-ocular motions intact bilaterally V,VII: smile symmetric, facial light touch sensation normal bilaterally VIII: hearing normal bilaterally IX,X: gag reflex present XI: bilateral shoulder shrug XII: midline tongue extension without atrophy or fasciculations  Motor: Right : Upper extremity   5/5    Left:     Upper extremity   5/5  Lower extremity   5/5     Lower extremity   5/5 Tone and bulk:normal tone throughout; no atrophy noted Sensory: Pinprick and light touch intact throughout,  bilaterally Deep Tendon Reflexes:  Brisk all over but mainly upper extremities. Plantars: Right: downgoing   Left: downgoing Cerebellar: normal finger-to-nose,  heel-to-shin test can not be tested. Gait: Unable to test due to multiple leads     Lab Results  Component Value Date/Time   CHOL  Value: 105        ATP III CLASSIFICATION:  <200     mg/dL   Desirable  200-239  mg/dL   Borderline High  >=240    mg/dL   High        03/12/2008  4:00 AM    Results for orders placed during the hospital encounter of 06/07/13 (from the past 48 hour(s))  CBC WITH  DIFFERENTIAL     Status: Abnormal   Collection Time    06/08/13 12:32 AM      Result Value Ref Range   WBC 7.2  4.0 - 10.5 K/uL   RBC 4.11  3.87 - 5.11 MIL/uL   Hemoglobin 11.9 (*) 12.0 - 15.0 g/dL   HCT 37.2  36.0 - 46.0 %   MCV 90.5  78.0 - 100.0 fL   MCH 29.0  26.0 - 34.0 pg   MCHC 32.0  30.0 - 36.0 g/dL   RDW 17.0 (*) 11.5 - 15.5 %   Platelets 138 (*) 150 - 400 K/uL   Neutrophils Relative % 76  43 - 77 %   Neutro Abs 5.5  1.7 - 7.7 K/uL   Lymphocytes Relative 14  12 - 46 %   Lymphs Abs 1.0  0.7 - 4.0 K/uL   Monocytes Relative 8  3 - 12 %   Monocytes Absolute 0.6  0.1 - 1.0 K/uL   Eosinophils Relative 2  0 - 5 %   Eosinophils Absolute 0.1  0.0 - 0.7 K/uL   Basophils Relative 0  0 - 1 %   Basophils Absolute 0.0  0.0 - 0.1 K/uL  COMPREHENSIVE METABOLIC PANEL     Status: Abnormal   Collection Time    06/08/13 12:32 AM      Result Value Ref Range   Sodium 137  137 - 147 mEq/L   Potassium 3.7  3.7 - 5.3 mEq/L   Chloride 97  96 - 112 mEq/L   CO2 25  19 - 32 mEq/L   Glucose, Bld 89  70 - 99 mg/dL   BUN 36 (*) 6 - 23 mg/dL   Creatinine, Ser 0.94  0.50 - 1.10 mg/dL   Calcium 10.3  8.4 - 10.5 mg/dL   Total Protein 7.0  6.0 - 8.3 g/dL   Albumin 3.1 (*) 3.5 - 5.2 g/dL   AST 91 (*) 0 - 37 U/L   ALT 29  0 - 35 U/L   Alkaline Phosphatase 84  39 - 117 U/L   Total Bilirubin 1.8 (*) 0.3 - 1.2 mg/dL   GFR calc non Af Amer 57 (*)  >90 mL/min   GFR calc Af Amer 66 (*) >90 mL/min   Comment: (NOTE)     The eGFR has been calculated using the CKD EPI equation.     This calculation has not been validated in all clinical situations.     eGFR's persistently <90 mL/min signify possible Chronic Kidney     Disease.  TROPONIN I     Status: None   Collection Time    06/08/13 12:32 AM      Result Value Ref Range   Troponin I <0.30  <0.30 ng/mL   Comment:            Due to the release kinetics of cTnI,     a negative result within the first hours     of the onset of symptoms does not rule out     myocardial infarction with certainty.     If myocardial infarction is still suspected,     repeat the test at appropriate intervals.  PRO B NATRIURETIC PEPTIDE     Status: Abnormal   Collection Time    06/08/13 12:32 AM      Result Value Ref Range   Pro B Natriuretic peptide (BNP) 4547.0 (*) 0 - 450 pg/mL  URINALYSIS, ROUTINE W REFLEX MICROSCOPIC  Status: Abnormal   Collection Time    06/08/13  2:04 AM      Result Value Ref Range   Color, Urine AMBER (*) YELLOW   Comment: BIOCHEMICALS MAY BE AFFECTED BY COLOR   APPearance CLEAR  CLEAR   Specific Gravity, Urine 1.020  1.005 - 1.030   pH 5.5  5.0 - 8.0   Glucose, UA NEGATIVE  NEGATIVE mg/dL   Hgb urine dipstick NEGATIVE  NEGATIVE   Bilirubin Urine NEGATIVE  NEGATIVE   Ketones, ur NEGATIVE  NEGATIVE mg/dL   Protein, ur NEGATIVE  NEGATIVE mg/dL   Urobilinogen, UA 1.0  0.0 - 1.0 mg/dL   Nitrite NEGATIVE  NEGATIVE   Leukocytes, UA NEGATIVE  NEGATIVE   Comment: MICROSCOPIC NOT DONE ON URINES WITH NEGATIVE PROTEIN, BLOOD, LEUKOCYTES, NITRITE, OR GLUCOSE <1000 mg/dL.  SEDIMENTATION RATE     Status: None   Collection Time    06/08/13  3:33 AM      Result Value Ref Range   Sed Rate 18  0 - 22 mm/hr  C-REACTIVE PROTEIN     Status: Abnormal   Collection Time    06/08/13  3:33 AM      Result Value Ref Range   CRP 1.8 (*) <0.60 mg/dL   Comment: Performed at [place] [facility], ED     Status: Abnormal   Collection Time    06/08/13  3:37 AM      Result Value Ref Range   Sodium 138  137 - 147 mEq/L   Potassium 3.3 (*) 3.7 - 5.3 mEq/L   Chloride 96  96 - 112 mEq/L   BUN 34 (*) 6 - 23 mg/dL   Creatinine, Ser 1.00  0.50 - 1.10 mg/dL   Glucose, Bld 91  70 - 99 mg/dL   Calcium, Ion 1.32 (*) 1.13 - 1.30 mmol/L   TCO2 24  0 - 100 mmol/L   Hemoglobin 12.6  12.0 - 15.0 g/dL   HCT 37.0  36.0 - 46.0 %  GLUCOSE, CAPILLARY     Status: None   Collection Time    06/08/13  6:22 AM      Result Value Ref Range   Glucose-Capillary 81  70 - 99 mg/dL  BASIC METABOLIC PANEL     Status: Abnormal   Collection Time    06/08/13  8:15 AM      Result Value Ref Range   Sodium 139  137 - 147 mEq/L   Potassium 3.6 (*) 3.7 - 5.3 mEq/L   Chloride 99  96 - 112 mEq/L   CO2 29  19 - 32 mEq/L   Glucose, Bld 89  70 - 99 mg/dL   BUN 34 (*) 6 - 23 mg/dL   Creatinine, Ser 0.93  0.50 - 1.10 mg/dL   Calcium 10.0  8.4 - 10.5 mg/dL   GFR calc non Af Amer 58 (*) >90 mL/min   GFR calc Af Amer 67 (*) >90 mL/min   Comment: (NOTE)     The eGFR has been calculated using the CKD EPI equation.     This calculation has not been validated in all clinical situations.     eGFR's persistently <90 mL/min signify possible Chronic Kidney     Disease.  TSH     Status: Abnormal   Collection Time    06/08/13  8:15 AM      Result Value Ref Range   TSH 21.500 (*) 0.350 - 4.500 uIU/mL  Comment: Please note change in reference range.  TROPONIN I     Status: None   Collection Time    06/08/13  8:15 AM      Result Value Ref Range   Troponin I <0.30  <0.30 ng/mL   Comment:            Due to the release kinetics of cTnI,     a negative result within the first hours     of the onset of symptoms does not rule out     myocardial infarction with certainty.     If myocardial infarction is still suspected,     repeat the test at appropriate intervals.  PROTIME-INR     Status: Abnormal    Collection Time    06/08/13  8:15 AM      Result Value Ref Range   Prothrombin Time 17.3 (*) 11.6 - 15.2 seconds   INR 1.45  0.00 - 1.49  GLUCOSE, CAPILLARY     Status: None   Collection Time    06/08/13 10:59 AM      Result Value Ref Range   Glucose-Capillary 82  70 - 99 mg/dL   Comment 1 Notify RN      Dg Abd 1 View  06/08/2013   CLINICAL DATA:  Abdominal swelling and constipation.  EXAM: ABDOMEN - 1 VIEW  COMPARISON:  CT ABD W/CM dated 02/02/2006; DG LUMBAR SPINE COMPLETE dated 08/22/2004  FINDINGS: Gas and stool in the colon. No small or large bowel distention. Postoperative changes in the pelvis. Degenerative changes in the lumbar spine with mild scoliosis convex towards the right. No radiopaque stones.  IMPRESSION: Nonobstructive bowel gas pattern.   Electronically Signed   By: Lucienne Capers M.D.   On: 06/08/2013 02:52   Dg Chest Port 1 View  06/08/2013   CLINICAL DATA:  Lower extremity edema. Patient has been unable to urinate for 3 days. No bowel movement for 3 weeks.  EXAM: PORTABLE CHEST - 1 VIEW  COMPARISON:  DG THORACIC SPINE W/SWIMMERS dated 03/08/2013; DG CHEST 2 VIEW dated 03/12/2008  FINDINGS: Shallow inspiration. Cardiac enlargement without significant pulmonary vascular congestion. No focal airspace disease or consolidation. No blunting of costophrenic angles. No pneumothorax. No significant change since previous study.  IMPRESSION: Cardiac enlargement.  No evidence of edema or active disease.   Electronically Signed   By: Lucienne Capers M.D.   On: 06/08/2013 01:54   Dg Hand Complete Left  06/08/2013   CLINICAL DATA:  Redness and swelling in both hands. No known injury.  EXAM: LEFT HAND - COMPLETE 3+ VIEW  COMPARISON:  None.  FINDINGS: Degenerative changes in the radiocarpal, STT, first carpometacarpal, first metacarpal phalangeal, and multiple interphalangeal joints. Subcortical cyst demonstrated in the scaphoid bone and in the second proximal phalanx. No evidence of acute  fracture or dislocation. No focal bone erosions. Mild dorsal soft tissue swelling.  IMPRESSION: Degenerative changes in the left hand. Soft tissue swelling. No acute bony abnormalities.   Electronically Signed   By: Lucienne Capers M.D.   On: 06/08/2013 02:51   Dg Hand Complete Right  06/08/2013   CLINICAL DATA:  Redness and swelling in both hands. No known injury.  EXAM: RIGHT HAND - COMPLETE 3+ VIEW  COMPARISON:  None.  FINDINGS: Degenerative changes in the radiocarpal, STT, first carpometacarpal, first metacarpophalangeal, and multiple interphalangeal joints. Dorsal soft tissue swelling. No focal bone erosion. No evidence of acute fracture or dislocation.  IMPRESSION: Degenerative changes in the right  hand. Soft tissue swelling. No acute bony abnormalities.   Electronically Signed   By: Lucienne Capers M.D.   On: 06/08/2013 02:52   Assessment/Plan: 79 y/o with multiple medical problems including Milroy's disease and frequent falls worsening in the past year. Neuro-exam is limited by pain but significant for overactive DTR's mainly upper extremities. Broad differential but her history and neuro-exam are not suggestive of an extrapyramidal disorder, a primary degenerative dementia, NPH, motor neuron disorder, or a neuropathic process. Doubt drop attacks/atonic seizures. Has overactive DTR's but no other findings to suggest a cervical myelopathy and she is experiencing increase in falls frequency and thus MRI brain and cervical spine seem to be warranted. However, she is claustrophobic and I am unsure she will be able to get those test done even after using IV sedation.  Dorian Pod, MD 06/08/2013, 3:30 PM [facility]

## 2013-06-08 NOTE — Care Management Note (Addendum)
    Page 1 of 1   06/08/2013     3:09:03 PM CARE MANAGEMENT NOTE 06/08/2013  Patient:  Victoria Fisher, Victoria Fisher   Account Number:  [id number]  Date Initiated:  06/08/2013  Documentation initiated by:  [facility]  Subjective/Objective Assessment:   79 y.o. female with Past medical history of COPD, pulmonary HTN and sleep apnea noncompliant with CPAP, right-sided heart failure, Milroy disease, A. fib, IBS, chronic pain syndrome.//Home with spouse     Action/Plan:   IV abx//Access for [facility] services   Anticipated DC Date:  06/10/2013   Anticipated DC Plan:  [place]  CM consult      Choice offered to / List presented to:          [facility] arranged  HH-1 RN  [place]      [place]   Status of service:  Completed, signed off Medicare Important Message given?   (If response is "NO", the following Medicare IM given date fields will be blank) Date Medicare IM given:   Date Additional Medicare IM given:    Discharge Disposition:    Per UR Regulation:  Reviewed for med. necessity/level of care/duration of stay  If discussed at [place] of Stay Meetings, dates discussed:    Comments:  06/08/13 [place], RN, BSN, [employer] [phone] Spoke with pt at bedside regarding discharge planning for [facility]. Offered pt list of home health agencies to choose from.  Pt chose [facility] to render services. Levonne Spiller of [facility] notified.  No DME needs identified at this time.

## 2013-06-08 NOTE — ED Notes (Signed)
Pt states she has been taking laxatives with no relief of bowels. Denies any abd pain at this time. Per friend pts color is not her norm pt is pale.

## 2013-06-08 NOTE — Progress Notes (Signed)
Patient does not want to take Heparin. Says her cardiologist stopped blood thinners last week due to frequent falls.  Zenovia Jordan RN

## 2013-06-08 NOTE — H&P (Signed)
[facility] History and Physical  Patient: Victoria Fisher  ZYS:[id number]  DOB: 1935-02-15  DOS: the patient was seen and examined on 06/08/2013 PCP: Haywood Pao, MD  Chief Complaint: Decrease urination  HPI: Victoria Fisher is a 79 y.o. female with Past medical history of COPD, pulmonary hypertension and sleep apnea noncompliant with CPAP, right-sided heart failure, Milroy disease, A. fib, IBS, chronic pain syndrome, not on any anticoagulation due to recurrent fall. The patient is presenting with complaints of decreased urination. She mentions she hasn't had any bowel movement since last 3 weeks. Due to which he has poor appetite and abdominal discomfort. She has not been voiding significantly as well and since last few days. She is on torsemide which she has stopped taking 2 days ago as she was concerned that that could be contributing to her constipation. She denies any fever, chills, chest pain, shortness of breath at rest, palpitation, nausea, vomiting. She complains of dyspnea on exertion which is at her baseline. She complains of abdominal distention had a sense of bloating which is new for her. She also complains of some burning when she T.'s. She has chronic leg swelling which is not new for her. She has noted that her legs have started beeping since last 6 months. She complains of PND no orthopnea. She is compliant with rest of her medications. 4 weeks ago she had an accident, her husband had placed some drain cleaning chemical, which she thinks might have also came to her washing machine and when she took about her clothes she found some burning in her hands, following that she developed swelling of her left hand with redness and diffuse macular rash around her arm. Left hand redness resolved but then she had some weakness there. And since last one week she has noted right hand redness associated with swelling and inability to make a fist due to pain and swelling.  The  patient is coming from home. And at her baseline Independent for most of her  ADL.  Review of Systems: as mentioned in the history of present illness.  A Comprehensive review of the other systems is negative.  Past Medical History  Diagnosis Date  . Anxiety   . Arthritis   . COPD (chronic obstructive pulmonary disease)   . Emphysema of lung   . Tuberculosis   . MI (myocardial infarction) 1999    Unstable angina  . Lung nodule   . Diverticulosis of colon   . Depression   . Milroy's disease     Lymphatic disorder [facility] at [facility]  . Chronic anticoagulation     A fib. On Coumadin.  . IBS (irritable bowel syndrome)   . Edema of both legs     Venous Doppler 05/11/12 was normal & no evidence of thrombus or thrombophlebitis. Chronic LE edema related to both cor pulmonale & lymphedema, hyperlipidemia & HTN involving coronary atherosclerosis by cardiac cath in 2004. Cath showed a tiny old occlusion of the optional diagonal branch & also proximal LAD.  [name] Chronic pain syndrome   . Hyperlipidemia     On a statin. Catheterization 02/12/03 Showed a tiny old occlusion of the optional diagonal branch & also proximal LAD.  [name] Hypertension     Myoview 07/2011 EF = >55%. RV was moderately dilated. LA was severely dilated. Catheterization 02/12/03 Showed a tiny old occlusion of the optional diagonal branch & also proximal LAD.  [name] Coronary artery disease   . Coronary atherosclerosis     Mild.  Myoview 07/2011 EF = >55%. RV was moderately dilated. LA was severely dilated. Catheterization 02/12/03 Showed a tiny old occlusion of the optional diagonal branch & also proximal LAD.  [name] Chronic cor pulmonale     Most likely due to COPD (cannot rule out contribution of diastolic LV dysfunction). ECHO 07/30/11 pulmonary artery pressure was estimated to be 50-60% mmHg & there was moderate tricuspid insufficiency.  . Frequent falls     Possibly due to orthostatic hypotension but we have not seen that on our visits.  Recent fall 05/2012 with scalp hematoma, swollen left LE, & black eyes.  . Obstructive sleep apnea     Polysomnogram 09/25/11 AHI: 53.6/hr overall & 57.1/hr during REM sleep. Poor compliance previously with CPAP  . Pulmonary hypertension     Pullmonary hypertension, chronic cor pulmonale w/pulmonary artery pressures of 50&#x2011; 60 mmHg.  . CHF (congestive heart failure)     Acute on chronic, diastolic, improved. Predominantly right heart failure due to cor pulmonale & in turn this is most likely due to untreated OSA.  [name] Dyspnea on exertion     Reluctant to take diuretics  . Atrial fibrillation, chronic     On Coumadin & a beta-blocker  . Cancer     Carcinoma of sigmoid colon [place].  [name] Bowel obstruction     Caused by scar tissue  . Hypothyroidism 2002    On hormone replacement therapy  . Asthma     On nebulizer  . Insomnia    Past Surgical History  Procedure Laterality Date  . Appendectomy  1956  . Abdominal hysterectomy  1983  . Colon resection      2 times  . Cholecystectomy  1984  . Colon biopsy    . Cardiac catheterization  02/12/03    Showed a tiny old occlusion of the optional diagonal branch & also proximal LAD.  [name] Colonoscopy  07/23/10   Social History:  reports that she has never smoked. She has never used smokeless tobacco. She reports that she does not drink alcohol or use illicit drugs.  Allergies  Allergen Reactions  . Codeine Anaphylaxis, Hives and Nausea And Vomiting  . Ciprofloxacin Hives and Nausea And Vomiting  . Latex Hives and Rash  . Penicillins Hives and Rash  . Sulfonamide Derivatives Hives and Rash    Family History  Problem Relation Age of Onset  . Heart disease Father   . Alzheimer's disease Mother     Prior to Admission medications   Medication Sig Start Date End Date Taking? Authorizing Provider  aspirin EC 81 MG tablet Take 81 mg by mouth every evening.    Yes Historical Provider, MD  Cholecalciferol (VITAMIN D PO) Take 2 tablets by  mouth every morning.   Yes Historical Provider, MD  hydrocortisone cream 0.5 % Apply 1 application topically 2 (two) times daily as needed (for itchiness).   Yes Historical Provider, MD  levothyroxine (SYNTHROID, LEVOTHROID) 25 MCG tablet Take 25 mcg by mouth every morning.   Yes Historical Provider, MD  magnesium hydroxide (PHILLIPS CHEWS) 311 MG CHEW chewable tablet Chew 311 mg by mouth daily as needed.   Yes Historical Provider, MD  metoprolol succinate (TOPROL-XL) 50 MG 24 hr tablet Take 50 mg by mouth daily. Take with or immediately following a meal.   Yes Historical Provider, MD  morphine (MS CONTIN) 60 MG 12 hr tablet Take 60 mg by mouth every 12 (twelve) hours.   Yes Historical Provider, MD  morphine (MSIR)  30 MG tablet Take 30 mg by mouth daily as needed (breakthrough pain morning).    Yes Historical Provider, MD  potassium chloride (K-DUR,KLOR-CON) 10 MEQ tablet Take 20 mEq by mouth 2 (two) times daily.   Yes Historical Provider, MD  senna (SENOKOT) 8.6 MG tablet Take 2-3 tablets by mouth daily.   Yes Historical Provider, MD  torsemide (DEMADEX) 20 MG tablet Take 40 mg by mouth 2 (two) times daily.   Yes Historical Provider, MD  zolpidem (AMBIEN CR) 6.25 MG CR tablet Take 6.25 mg by mouth at bedtime as needed for sleep.   Yes Historical Provider, MD  albuterol (PROVENTIL HFA;VENTOLIN HFA) 108 (90 BASE) MCG/ACT inhaler Inhale 2 puffs into the lungs every 6 (six) hours as needed for shortness of breath.    Historical Provider, MD  nitroGLYCERIN (NITROLINGUAL) 0.4 MG/SPRAY spray Place 1 spray under the tongue every 5 (five) minutes as needed for chest pain. 12/13/12   Sanda Klein, MD    Physical Exam: Filed Vitals:   06/07/13 2357 06/08/13 0026 06/08/13 0501 06/08/13 0556  BP:  115/58 118/68   Pulse:  70    Temp:  97.7 F (36.5 C)    TempSrc:  Oral    Resp:   19   Height:    5' 5"  (1.651 m)  Weight:    85.821 kg (189 lb 3.2 oz)  SpO2: 98% 100%      General: Alert, Awake and  Oriented to Time, Place and Person. Appear in mild distress Eyes: PERRL ENT: Oral Mucosa clear, dry lips. Neck: Minimal JVD Cardiovascular: S1 and S2 Present, no Murmur, Peripheral Pulses Present Respiratory: Bilateral Air entry equal and Decreased, faint basal Crackles, no wheezes Abdomen: Bowel Sound Present, Soft and distended, Non tender Skin: Multiple small macular Rash upper extremity Extremities: Bilateral hand redness. Right hand swelling with tenderness on the dorsum. Minimal warmth. Left hand redness without any swelling. Right hand decreased range of motion due to pain and swelling, left hand range of motion normal. Bilateral Pedal edema with redness, no  calf tenderness Neurologic: Grossly Unremarkable.  Labs on Admission:  CBC:  Recent Labs Lab 06/08/13 0032 06/08/13 0337  WBC 7.2  --   NEUTROABS 5.5  --   HGB 11.9* 12.6  HCT 37.2 37.0  MCV 90.5  --   PLT 138*  --     CMP     Component Value Date/Time   NA 138 06/08/2013 0337   K 3.3* 06/08/2013 0337   CL 96 06/08/2013 0337   CO2 25 06/08/2013 0032   GLUCOSE 91 06/08/2013 0337   BUN 34* 06/08/2013 0337   CREATININE 1.00 06/08/2013 0337   CALCIUM 10.3 06/08/2013 0032   PROT 7.0 06/08/2013 0032   ALBUMIN 3.1* 06/08/2013 0032   AST 91* 06/08/2013 0032   ALT 29 06/08/2013 0032   ALKPHOS 84 06/08/2013 0032   BILITOT 1.8* 06/08/2013 0032   GFRNONAA 57* 06/08/2013 0032   GFRAA 66* 06/08/2013 0032    No results found for this basename: LIPASE, AMYLASE,  in the last 168 hours No results found for this basename: AMMONIA,  in the last 168 hours   Recent Labs Lab 06/08/13 0032  TROPONINI <0.30   BNP (last 3 results)  Recent Labs  06/08/13 0032  PROBNP 4547.0*    Radiological Exams on Admission: Dg Abd 1 View  06/08/2013   CLINICAL DATA:  Abdominal swelling and constipation.  EXAM: ABDOMEN - 1 VIEW  COMPARISON:  CT ABD W/CM  dated 02/02/2006; DG LUMBAR SPINE COMPLETE dated 08/22/2004  FINDINGS: Gas and stool in the  colon. No small or large bowel distention. Postoperative changes in the pelvis. Degenerative changes in the lumbar spine with mild scoliosis convex towards the right. No radiopaque stones.  IMPRESSION: Nonobstructive bowel gas pattern.   Electronically Signed   By: Lucienne Capers M.D.   On: 06/08/2013 02:52   Dg Chest Port 1 View  06/08/2013   CLINICAL DATA:  Lower extremity edema. Patient has been unable to urinate for 3 days. No bowel movement for 3 weeks.  EXAM: PORTABLE CHEST - 1 VIEW  COMPARISON:  DG THORACIC SPINE W/SWIMMERS dated 03/08/2013; DG CHEST 2 VIEW dated 03/12/2008  FINDINGS: Shallow inspiration. Cardiac enlargement without significant pulmonary vascular congestion. No focal airspace disease or consolidation. No blunting of costophrenic angles. No pneumothorax. No significant change since previous study.  IMPRESSION: Cardiac enlargement.  No evidence of edema or active disease.   Electronically Signed   By: Lucienne Capers M.D.   On: 06/08/2013 01:54   Dg Hand Complete Left  06/08/2013   CLINICAL DATA:  Redness and swelling in both hands. No known injury.  EXAM: LEFT HAND - COMPLETE 3+ VIEW  COMPARISON:  None.  FINDINGS: Degenerative changes in the radiocarpal, STT, first carpometacarpal, first metacarpal phalangeal, and multiple interphalangeal joints. Subcortical cyst demonstrated in the scaphoid bone and in the second proximal phalanx. No evidence of acute fracture or dislocation. No focal bone erosions. Mild dorsal soft tissue swelling.  IMPRESSION: Degenerative changes in the left hand. Soft tissue swelling. No acute bony abnormalities.   Electronically Signed   By: Lucienne Capers M.D.   On: 06/08/2013 02:51   Dg Hand Complete Right  06/08/2013   CLINICAL DATA:  Redness and swelling in both hands. No known injury.  EXAM: RIGHT HAND - COMPLETE 3+ VIEW  COMPARISON:  None.  FINDINGS: Degenerative changes in the radiocarpal, STT, first carpometacarpal, first metacarpophalangeal, and  multiple interphalangeal joints. Dorsal soft tissue swelling. No focal bone erosion. No evidence of acute fracture or dislocation.  IMPRESSION: Degenerative changes in the right hand. Soft tissue swelling. No acute bony abnormalities.   Electronically Signed   By: Lucienne Capers M.D.   On: 06/08/2013 02:52    EKG: Independently reviewed. atrial fibrillation, rate controlled.  Assessment/Plan Principal Problem:   Cellulitis of right hand Active Problems:   HYPERTENSION   CORONARY ARTERY DISEASE   ATRIAL FIBRILLATION, CHRONIC   MILROY'S DISEASE   Bilateral leg edema   Pulmonary hypertension   Congestive heart failure, acute, right-sided   Possible Chemical burn   1. Cellulitis of right hand Possible chemical burn The patient is presenting with complaints of right hand swelling with redness and minimal warmth. she does not her leukocytosis or fever . ESR is normal. x-ray shows evidence of soft tissue swelling without any involvement of the bone. It is unclear whether she had had a cellulitis or chemical injury due to earlier accident with secondary infection. At present I would treat her with IV vancomycin and continue to follow her swelling. If her pain does not improve in her hand and she may require further workup.  2.Acute on chronic right-sided heart failure  Patient has elevated proBNP with progressively worsening leg swelling, and is not taking her torsemide for 2 days. With this most likely patient has acute on chronic diastolic heart failure. I would continue her torsemide at home dose and obtain a limited echocardiogram. Serial troponins. Continue her home  antihypertensive medications as well. Possible etiology as per her cardiologist is pulmonary hypertension secondary to sleep apnea due to noncompliance with CPAP.   3.Milroy's disease Chronic pain syndrome Continue home dose of MS Contin and morphine as needed.  4.Chronic atrial fibrillation At present stable Patient  has recurrent fall for which her cardiologist her stop her Coumadin. Her recurrent fall has been thought to be secondary to generalized leg weakness. May require PTOT consultation  DVT Prophylaxis: subcutaneous Heparin Nutrition: Cardiac diet  Code Status: Full  Disposition: Admitted to inpatient in telemetry unit.  Author: Berle Mull, MD [facility] Pager: [phone] 06/08/2013, 6:53 AM    If 7PM-7AM, please contact night-coverage [email or web address] Password [email or web address] and he really hereditary and and pain in her right

## 2013-06-08 NOTE — ED Provider Notes (Signed)
CSN: [id number]     Arrival date & time 06/07/13  2350 History   First MD Initiated Contact with Patient 06/07/13 2355     Chief Complaint  Patient presents with  . Urinary Retention     (Consider location/radiation/quality/duration/timing/severity/associated sxs/prior Treatment) HPI Comments: 79 y.o. 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 Pt has had increased leg swelling over the past few days. She has not been taking her lasix as prescribed. Pt has not had a BM in 2-3 weeks, she doesn't think she is passing flatus. She denies abd pain, nausea, anorexia. + abd surgical hx. Pt has hand pain, and redness, right worse than left. Swelling on the right side started today. She has a throbbing pain and redness. No fevers, nausea, chills. Pt has bee having trouble walking the last few days, and has had several falls. Pt denies chest pain, but does have some dib with exertion. No orthopnea, PND.  The history is provided by the patient and medical records.    Past Medical History  Diagnosis Date  . Anxiety   . Arthritis   . COPD (chronic obstructive pulmonary disease)   . Emphysema of lung   . Tuberculosis   . MI (myocardial infarction) 1999    Unstable angina  . Lung nodule   . Diverticulosis of colon   . Depression   . Milroy's disease     Lymphatic disorder diagnosed at [facility]  . Chronic anticoagulation     A fib. On Coumadin.  . IBS (irritable bowel syndrome)   . Edema of both legs     Venous Doppler 05/11/12 was normal & no evidence of thrombus or thrombophlebitis. Chronic LE edema related to both cor pulmonale & lymphedema, hyperlipidemia & HTN involving coronary atherosclerosis by cardiac cath in 2004. Cath showed a tiny old occlusion of the optional diagonal branch & also proximal LAD.  [name] Chronic pain syndrome    . Hyperlipidemia     On a statin. Catheterization 02/12/03 Showed a tiny old occlusion of the optional diagonal branch & also proximal LAD.  [name] Hypertension     Myoview 07/2011 EF = >55%. RV was moderately dilated. LA was severely dilated. Catheterization 02/12/03 Showed a tiny old occlusion of the optional diagonal branch & also proximal LAD.  [name] Coronary artery disease   . Coronary atherosclerosis     Mild. Myoview 07/2011 EF = >55%. RV was moderately dilated. LA was severely dilated. Catheterization 02/12/03 Showed a tiny old occlusion of the optional diagonal branch & also proximal LAD.  [name] Chronic cor pulmonale     Most likely due to COPD (cannot rule out contribution of diastolic LV dysfunction). ECHO 07/30/11 pulmonary artery pressure was estimated to be 50-60% mmHg & there was moderate tricuspid insufficiency.  . Frequent falls     Possibly due to orthostatic hypotension but we have not seen that on our visits. Recent fall 05/2012 with scalp hematoma, swollen left LE, & black eyes.  . Obstructive sleep apnea     Polysomnogram 09/25/11 AHI: 53.6/hr overall & 57.1/hr during REM sleep. Poor compliance previously with CPAP  . Pulmonary hypertension     Pullmonary hypertension, chronic cor pulmonale w/pulmonary artery pressures of 50&#x2011; 60 mmHg.  . CHF (congestive heart failure)     Acute on chronic, diastolic, improved. Predominantly right heart failure due to  cor pulmonale & in turn this is most likely due to untreated OSA.  [name] Dyspnea on exertion     Reluctant to take diuretics  . Atrial fibrillation, chronic     On Coumadin & a beta-blocker  . Cancer     Carcinoma of sigmoid colon [place].  [name] Bowel obstruction     Caused by scar tissue  . Hypothyroidism 2002    On hormone replacement therapy  . Asthma     On nebulizer  . Insomnia    Past Surgical History  Procedure Laterality Date  . Appendectomy  1956  . Abdominal hysterectomy  1983  . Colon resection      2 times  .  Cholecystectomy  1984  . Colon biopsy    . Cardiac catheterization  02/12/03    Showed a tiny old occlusion of the optional diagonal branch & also proximal LAD.  [name] Colonoscopy  07/23/10   Family History  Problem Relation Age of Onset  . Heart disease Father   . Alzheimer's disease Mother    History  Substance Use Topics  . Smoking status: Never Smoker   . Smokeless tobacco: Never Used  . Alcohol Use: No   OB History   Grav Para Term Preterm Abortions TAB SAB Ect Mult Living                 Review of Systems  Constitutional: Positive for activity change.  Respiratory: Positive for shortness of breath. Negative for cough.   Cardiovascular: Negative for chest pain.  Gastrointestinal: Positive for constipation. Negative for nausea, vomiting and abdominal pain.  Genitourinary: Positive for difficulty urinating. Negative for dysuria.  Musculoskeletal: Negative for neck pain.  Skin: Positive for rash.  Neurological: Negative for headaches.  All other systems reviewed and are negative.     Allergies  Codeine; Ciprofloxacin; Latex; Penicillins; and Sulfonamide derivatives  Home Medications   Prior to Admission medications   Medication Sig Start Date End Date Taking? Authorizing Provider  aspirin EC 81 MG tablet Take 81 mg by mouth every evening.    Yes Historical Provider, MD  Cholecalciferol (VITAMIN D PO) Take 2 tablets by mouth every morning.   Yes Historical Provider, MD  hydrocortisone cream 0.5 % Apply 1 application topically 2 (two) times daily as needed (for itchiness).   Yes Historical Provider, MD  levothyroxine (SYNTHROID, LEVOTHROID) 25 MCG tablet Take 25 mcg by mouth every morning.   Yes Historical Provider, MD  metoprolol succinate (TOPROL-XL) 50 MG 24 hr tablet Take 50 mg by mouth daily. Take with or immediately following a meal.   Yes Historical Provider, MD  morphine (MS CONTIN) 60 MG 12 hr tablet Take 60 mg by mouth every 12 (twelve) hours.   Yes Historical  Provider, MD  morphine (MSIR) 30 MG tablet Take 30 mg by mouth daily as needed (breakthrough pain morning).    Yes Historical Provider, MD  potassium chloride (K-DUR,KLOR-CON) 10 MEQ tablet Take 20 mEq by mouth 2 (two) times daily.   Yes Historical Provider, MD  torsemide (DEMADEX) 20 MG tablet Take 40 mg by mouth 2 (two) times daily.   Yes Historical Provider, MD  zolpidem (AMBIEN CR) 6.25 MG CR tablet Take 6.25 mg by mouth at bedtime as needed for sleep.   Yes Historical Provider, MD  albuterol (PROVENTIL HFA;VENTOLIN HFA) 108 (90 BASE) MCG/ACT inhaler Inhale 2 puffs into the lungs every 6 (six) hours as needed for shortness of breath.    Historical  Provider, MD  nitroGLYCERIN (NITROLINGUAL) 0.4 MG/SPRAY spray Place 1 spray under the tongue every 5 (five) minutes as needed for chest pain. 12/13/12   Mihai Croitoru, MD   BP 115/58  Pulse 70  Temp(Src) 97.7 F (36.5 C) (Oral)  SpO2 100% Physical Exam  Nursing note and vitals reviewed. Constitutional: She is oriented to person, place, and time. She appears well-developed and well-nourished.  HENT:  Head: Normocephalic and atraumatic.  Eyes: EOM are normal. Pupils are equal, round, and reactive to light.  Neck: Neck supple. No JVD present.  Cardiovascular: Normal rate, regular rhythm and normal heart sounds.   No murmur heard. Pulmonary/Chest: Effort normal. No respiratory distress. She has no wheezes.  Abdominal: Soft. She exhibits distension. There is no tenderness. There is no rebound and no guarding.  Musculoskeletal: She exhibits edema.  3+ pitting edema bilaterally  Neurological: She is alert and oriented to person, place, and time.  Skin: Skin is warm and dry. Rash noted.  Right hand has some edema, erythema, tenderness to palpation. She is able to make a fist - but not able to completely close her fist. No tenderness with passive movement of the digits.    ED Course  Procedures (including critical care time) Labs Review Labs  Reviewed  CBC WITH DIFFERENTIAL - Abnormal; Notable for the following:    Hemoglobin 11.9 (*)    RDW 17.0 (*)    Platelets 138 (*)    All other components within normal limits  URINALYSIS, ROUTINE W REFLEX MICROSCOPIC - Abnormal; Notable for the following:    Color, Urine AMBER (*)    All other components within normal limits  PRO B NATRIURETIC PEPTIDE - Abnormal; Notable for the following:    Pro B Natriuretic peptide (BNP) 4547.0 (*)    All other components within normal limits  TROPONIN I  COMPREHENSIVE METABOLIC PANEL  SEDIMENTATION RATE  C-REACTIVE PROTEIN    Imaging Review Dg Abd 1 View  06/08/2013   CLINICAL DATA:  Abdominal swelling and constipation.  EXAM: ABDOMEN - 1 VIEW  COMPARISON:  CT ABD W/CM dated 02/02/2006; DG LUMBAR SPINE COMPLETE dated 08/22/2004  FINDINGS: Gas and stool in the colon. No small or large bowel distention. Postoperative changes in the pelvis. Degenerative changes in the lumbar spine with mild scoliosis convex towards the right. No radiopaque stones.  IMPRESSION: Nonobstructive bowel gas pattern.   Electronically Signed   By: Burman Nieves M.D.   On: 06/08/2013 02:52   Dg Chest Port 1 View  06/08/2013   CLINICAL DATA:  Lower extremity edema. Patient has been unable to urinate for 3 days. No bowel movement for 3 weeks.  EXAM: PORTABLE CHEST - 1 VIEW  COMPARISON:  DG THORACIC SPINE W/SWIMMERS dated 03/08/2013; DG CHEST 2 VIEW dated 03/12/2008  FINDINGS: Shallow inspiration. Cardiac enlargement without significant pulmonary vascular congestion. No focal airspace disease or consolidation. No blunting of costophrenic angles. No pneumothorax. No significant change since previous study.  IMPRESSION: Cardiac enlargement.  No evidence of edema or active disease.   Electronically Signed   By: Burman Nieves M.D.   On: 06/08/2013 01:54   Dg Hand Complete Left  06/08/2013   CLINICAL DATA:  Redness and swelling in both hands. No known injury.  EXAM: LEFT HAND - COMPLETE  3+ VIEW  COMPARISON:  None.  FINDINGS: Degenerative changes in the radiocarpal, STT, first carpometacarpal, first metacarpal phalangeal, and multiple interphalangeal joints. Subcortical cyst demonstrated in the scaphoid bone and in the second proximal phalanx.  No evidence of acute fracture or dislocation. No focal bone erosions. Mild dorsal soft tissue swelling.  IMPRESSION: Degenerative changes in the left hand. Soft tissue swelling. No acute bony abnormalities.   Electronically Signed   By: Lucienne Capers M.D.   On: 06/08/2013 02:51   Dg Hand Complete Right  06/08/2013   CLINICAL DATA:  Redness and swelling in both hands. No known injury.  EXAM: RIGHT HAND - COMPLETE 3+ VIEW  COMPARISON:  None.  FINDINGS: Degenerative changes in the radiocarpal, STT, first carpometacarpal, first metacarpophalangeal, and multiple interphalangeal joints. Dorsal soft tissue swelling. No focal bone erosion. No evidence of acute fracture or dislocation.  IMPRESSION: Degenerative changes in the right hand. Soft tissue swelling. No acute bony abnormalities.   Electronically Signed   By: Lucienne Capers M.D.   On: 06/08/2013 02:52     EKG Interpretation   Date/Time:  Thursday June 08 2013 00:08:40 EDT Ventricular Rate:  90 PR Interval:    QRS Duration: 131 QT Interval:  373 QTC Calculation: 456 R Axis:   64 Text Interpretation:  Atrial fibrillation Ventricular premature complex  IVCD, consider atypical RBBB Abnormal T, consider ischemia, lateral leads  Artifact in lead(s) I III aVR aVL TOO MUCH ARTIFACE No evidence of STEMI  Confirmed by Lilyona Richner, MD, Adamari Frede [phone]) on 06/08/2013 12:17:09 AM      MDM   Final diagnoses:  None    Pt comes in with several complains.  -She has a hand infection, deep, but unlikely abscess. Xrays of hand shows no free air. Vancomycin started - Pt will need hand consult. - She has right sided heart failure with severe pitting edema. - She has some constipation, Xray shows no  SBO. - Frequent falls - will need pt/ot. Some of the gait issues are from the leg swelling, but seems like the falls have been an active problem for a while. - exertional dyspnea - likely CHF.  Will need admission and med optimization.   Varney Biles, MD 06/08/13 [phone]

## 2013-06-08 NOTE — Progress Notes (Addendum)
PROGRESS NOTE    Victoria Fisher NWG:[id number] DOB: 1934-05-15 DOA: 06/07/2013 PCP: Haywood Pao, MD Primary Cardiologist: Dr. Sanda Klein  HPI/Brief narrative 79 year old female patient with history of COPD, pulmonary hypertension, OSA, noncompliant with CPAP, cor pulmonale, Milroy's disease, A. fib, IBS, chronic pain syndrome, not on anticoagulation secondary to recurrent falls, presented to ED on 06/08/13 with decreased urination, constipation, poor appetite and abdominal discomfort, stop taking torsemide secondary to constipation, baseline DOE, painful, swollen and red right hand. Admitted for suspected right hand cellulitis.   Assessment/Plan:  1. Suspected cellulitis of right hand and distal forearm: She states that she had similar but milder presentation of left hand 3 weeks ago which resolved spontaneously. Right hand symptoms began 4-5 days ago. She has several tiny cuts on her hands which she states are due to home activity and falls. Claims to have noticed right hand symptoms after moving wet clothes from washer after spouse had tried to declog the drain with chemical agent. She and spouse however her not clear if she truly came in contact with the agent. Continue IV vancomycin, elevate right upper extremity and monitor closely. If she does not improve clinically then consider other etiologies-? RSD. 2. Frequent falls at home: No reported dizziness or LOC. CT head 3/29 and CT C-spine 1/21 without acute findings. She has had several CT head and C-spine studies. Warfarin discontinued, at least temporarily secondary to high fall risk. Discussed with primary cardiologist. Lisinopril and isosorbide were discontinued secondary to hypotension. Statins discontinued secondary to muscle weakness. Cardiologist has referred her to see neurology as outpatient which is appropriate. Will request neurology consultation. 3. Permanent atrial fibrillation: Ventricular rate controlled in the 60s and  70s. Anticoagulation discontinued secondary to fall risk. 4. OSA/moderate pulmonary hypertension/cor pulmonale: Seems clinically compensated. Patient not compliant with CPAP. Lower extremity edema is only minimal and seems chronic. 5. History of Milroy's disease: 6. History of mild CAD by remote cardiac cath:  7. Chronic pain syndrome: Denies pain at this time. 8. Anemia and thrombocytopenia: Chronic and stable. 9. Constipation: Bowel regimen 10. History of hypothyroidism: TSH markedly elevated at 21.5. Increase Synthroid from 25 > 37.5 mcg and followup TSH in 4-6 weeks.  Code Status:  Full Family Communication:  Discussed with spouse at bedside. Disposition Plan:  To be determined after PT and OT evaluation   Consultants:  Neurology-pending  Procedures:  None  Antibiotics:  IV Vancomycin   Subjective: Right hand   Objective: Filed Vitals:   06/08/13 0026 06/08/13 0501 06/08/13 0556 06/08/13 1100  BP: 115/58 118/68  133/78  Pulse: 70   77  Temp: 97.7 F (36.5 C)   97.7 F (36.5 C)  TempSrc: Oral   Oral  Resp:  19    Height:   5\' 5"  (1.651 m)   Weight:   85.821 kg (189 lb 3.2 oz)   SpO2: 100%   98%    Intake/Output Summary (Last 24 hours) at 06/08/13 1213 Last data filed at 06/08/13 1126  Gross per 24 hour  Intake    480 ml  Output    350 ml  Net    130 ml   Filed Weights   06/08/13 0556  Weight: 85.821 kg (189 lb 3.2 oz)     Exam:  General exam:  Pleasant elderly female lying comfortably in bed. Respiratory system: Clear. No increased work of breathing. Cardiovascular system: S1 & S2 heard, RRR. No JVD, murmurs, gallops, clicks. Trace chronic bilateral ankle edema with wrinkling  of skin. Telemetry: Atrial fibrillation with ventricular rate in the 60s to 70s. Gastrointestinal system: Abdomen is nondistended, soft and nontender. Normal bowel sounds heard. Central nervous system: Alert and oriented. No focal neurological deficits. Extremities: Symmetric 5  x 5 power. Erythema, swelling , increased warmth and tenderness of dorsum of right hand and distal right forearm. Good symmetric peripheral pulsations. Slightly painful range of movements. Left hand has faint erythema and? Macerated appearance of fingers.   Data Reviewed: Basic Metabolic Panel:  Recent Labs Lab 06/08/13 0032 06/08/13 0337 06/08/13 0815  NA 137 138 139  K 3.7 3.3* 3.6*  CL 97 96 99  CO2 25  --  29  GLUCOSE 89 91 89  BUN 36* 34* 34*  CREATININE 0.94 1.00 0.93  CALCIUM 10.3  --  10.0   Liver Function Tests:  Recent Labs Lab 06/08/13 0032  AST 91*  ALT 29  ALKPHOS 84  BILITOT 1.8*  PROT 7.0  ALBUMIN 3.1*   No results found for this basename: LIPASE, AMYLASE,  in the last 168 hours No results found for this basename: AMMONIA,  in the last 168 hours CBC:  Recent Labs Lab 06/08/13 0032 06/08/13 0337  WBC 7.2  --   NEUTROABS 5.5  --   HGB 11.9* 12.6  HCT 37.2 37.0  MCV 90.5  --   PLT 138*  --    Cardiac Enzymes:  Recent Labs Lab 06/08/13 0032 06/08/13 0815  TROPONINI <0.30 <0.30   BNP (last 3 results)  Recent Labs  06/08/13 0032  PROBNP 4547.0*   CBG:  Recent Labs Lab 06/08/13 0622 06/08/13 1059  GLUCAP 81 82    No results found for this or any previous visit (from the past 240 hour(s)).       Studies: Dg Abd 1 View  06/08/2013   CLINICAL DATA:  Abdominal swelling and constipation.  EXAM: ABDOMEN - 1 VIEW  COMPARISON:  CT ABD W/CM dated 02/02/2006; DG LUMBAR SPINE COMPLETE dated 08/22/2004  FINDINGS: Gas and stool in the colon. No small or large bowel distention. Postoperative changes in the pelvis. Degenerative changes in the lumbar spine with mild scoliosis convex towards the right. No radiopaque stones.  IMPRESSION: Nonobstructive bowel gas pattern.   Electronically Signed   By: Lucienne Capers M.D.   On: 06/08/2013 02:52   Dg Chest Port 1 View  06/08/2013   CLINICAL DATA:  Lower extremity edema. Patient has been unable to  urinate for 3 days. No bowel movement for 3 weeks.  EXAM: PORTABLE CHEST - 1 VIEW  COMPARISON:  DG THORACIC SPINE W/SWIMMERS dated 03/08/2013; DG CHEST 2 VIEW dated 03/12/2008  FINDINGS: Shallow inspiration. Cardiac enlargement without significant pulmonary vascular congestion. No focal airspace disease or consolidation. No blunting of costophrenic angles. No pneumothorax. No significant change since previous study.  IMPRESSION: Cardiac enlargement.  No evidence of edema or active disease.   Electronically Signed   By: Lucienne Capers M.D.   On: 06/08/2013 01:54   Dg Hand Complete Left  06/08/2013   CLINICAL DATA:  Redness and swelling in both hands. No known injury.  EXAM: LEFT HAND - COMPLETE 3+ VIEW  COMPARISON:  None.  FINDINGS: Degenerative changes in the radiocarpal, STT, first carpometacarpal, first metacarpal phalangeal, and multiple interphalangeal joints. Subcortical cyst demonstrated in the scaphoid bone and in the second proximal phalanx. No evidence of acute fracture or dislocation. No focal bone erosions. Mild dorsal soft tissue swelling.  IMPRESSION: Degenerative changes in the left hand.  Soft tissue swelling. No acute bony abnormalities.   Electronically Signed   By: Lucienne Capers M.D.   On: 06/08/2013 02:51   Dg Hand Complete Right  06/08/2013   CLINICAL DATA:  Redness and swelling in both hands. No known injury.  EXAM: RIGHT HAND - COMPLETE 3+ VIEW  COMPARISON:  None.  FINDINGS: Degenerative changes in the radiocarpal, STT, first carpometacarpal, first metacarpophalangeal, and multiple interphalangeal joints. Dorsal soft tissue swelling. No focal bone erosion. No evidence of acute fracture or dislocation.  IMPRESSION: Degenerative changes in the right hand. Soft tissue swelling. No acute bony abnormalities.   Electronically Signed   By: Lucienne Capers M.D.   On: 06/08/2013 02:52        Scheduled Meds: . aspirin EC  81 mg Oral QPM  . heparin  5,000 Units Subcutaneous 3 times per  day  . levothyroxine  25 mcg Oral q morning - 10a  . metoprolol succinate  50 mg Oral Daily  . morphine  60 mg Oral Q12H  . potassium chloride  20 mEq Oral BID  . senna-docusate  2 tablet Oral BID  . sodium chloride  3 mL Intravenous Q12H  . torsemide  40 mg Oral BID  . [START ON 06/09/2013] vancomycin  1,000 mg Intravenous Q24H   Continuous Infusions:   Principal Problem:   Cellulitis of right hand Active Problems:   HYPERTENSION   CORONARY ARTERY DISEASE   ATRIAL FIBRILLATION, CHRONIC   CONSTIPATION   MILROY'S DISEASE   Bilateral leg edema   Pulmonary hypertension   Congestive heart failure, acute, right-sided   Possible Chemical burn    Time spent:  40 minutes    Modena Jansky, MD, FACP, [facility]. [facility] Pager [phone]  If 7PM-7AM, please contact night-coverage [email or web address] Password [facility] 06/08/2013, 12:13 PM    LOS: 1 day

## 2013-06-09 ENCOUNTER — Ambulatory Visit (HOSPITAL_COMMUNITY): Payer: Medicare Other

## 2013-06-09 DIAGNOSIS — I4891 Unspecified atrial fibrillation: Secondary | ICD-10-CM | POA: Diagnosis not present

## 2013-06-09 DIAGNOSIS — Z9181 History of falling: Secondary | ICD-10-CM | POA: Diagnosis not present

## 2013-06-09 DIAGNOSIS — R609 Edema, unspecified: Secondary | ICD-10-CM | POA: Diagnosis not present

## 2013-06-09 DIAGNOSIS — L02519 Cutaneous abscess of unspecified hand: Secondary | ICD-10-CM | POA: Diagnosis not present

## 2013-06-09 DIAGNOSIS — R296 Repeated falls: Secondary | ICD-10-CM

## 2013-06-09 LAB — GLUCOSE, CAPILLARY: Glucose-Capillary: 108 mg/dL — ABNORMAL HIGH (ref 70–99)

## 2013-06-09 MED ORDER — POTASSIUM CHLORIDE CRYS ER 20 MEQ PO TBCR: 40.0000 meq | EXTENDED_RELEASE_TABLET | Freq: Once | ORAL | Status: DC

## 2013-06-09 MED ORDER — LORAZEPAM 2 MG/ML IJ SOLN
2.0000 mg | Freq: Once | INTRAMUSCULAR | Status: AC
  Administered 2013-06-10: 2 mg via INTRAVENOUS
  Filled 2013-06-09 (×2): qty 1

## 2013-06-09 NOTE — Progress Notes (Signed)
[facility] PROGRESS NOTE  Victoria Fisher JHE:[id number] DOB: February 23, 1934 DOA: 06/07/2013 PCP: Haywood Pao, MD  HPI/Brief narrative  79 year old female patient with history of COPD, pulmonary hypertension, OSA, noncompliant with CPAP, cor pulmonale, Milroy's disease, A. fib, IBS, chronic pain syndrome, not on anticoagulation secondary to recurrent falls, presented to ED on 06/08/13 with decreased urination, constipation, poor appetite and abdominal discomfort, stop taking torsemide secondary to constipation, baseline DOE, painful, swollen and red right hand. Admitted for suspected right hand cellulitis.   Assessment/Plan:  Suspected cellulitis of right hand and distal forearm:  She reports that she had similar but milder presentation of left hand 3 weeks ago which resolved spontaneously. Right hand symptoms began 4-5 days ago. She has several tiny cuts on her hands which she states are due to home activity and falls. Claims to have noticed right hand symptoms after moving wet clothes from washer after spouse had tried to declog the drain with chemical agent.  Continue IV vancomycin, elevate right upper extremity . Still has pain over rt hand but swelling and erythema improving and confined to dorsum of hand and fingers only .  Frequent falls at home:  No reported dizziness or LOC. CT head 3/29 and CT C-spine 1/21 without acute findings. She has had several CT head and C-spine studies. Warfarin discontinued for now secondary to high fall risk. Discussed with primary cardiologist. Lisinopril and isosorbide were discontinued secondary to hypotension. Statins discontinued secondary to muscle weakness.  Neurology consulted. Has increased reflexes in UE. Recommends MRI brain dn C spine which has been ordered.  Permanent atrial fibrillation:  Rate controled,  Anticoagulation discontinued secondary to fall risk.  History of hypothyroidism:  TSH markedly elevated at 21.5. Increased Synthroid  from 25 > 37.5 mcg and followup TSH in 4-6 weeks.  OSA/moderate pulmonary hypertension/cor pulmonale:  Stable. Patient not compliant with CPAP. Has pitting B/l lower extremitiy seems chronic.  Chronic pain syndrome: Stable at present  Anemia and thrombocytopenia: Chronic and stable.  Constipation: continue Bowel regimen  History of Milroy's disease: History of mild CAD by remote cardiac cath:        Consultants:  Neurology   Procedures:  None   Antibiotics:  IV Vancomycin    Subjective:  Right hand swelling improved.     Code Status:full Family Communication: none at bedside Disposition Plan: pending PT eval      HPI/Subjective: Reports pain and swelling over rt hand improved. denies any dizziness or unsteadiness  Objective: Filed Vitals:   06/09/13 0631  BP: 117/74  Pulse: 71  Temp: 98.1 F (36.7 C)  Resp: 17    Intake/Output Summary (Last 24 hours) at 06/09/13 0955 Last data filed at 06/09/13 0908  Gross per 24 hour  Intake    960 ml  Output   3150 ml  Net  -2190 ml   Filed Weights   06/08/13 0556 06/09/13 0403  Weight: 85.821 kg (189 lb 3.2 oz) 84.4 kg (186 lb 1.1 oz)    Exam:   General: elderly female in NAD  HEENT: no pallor, moist oral mucosa  Chest: clear b/l, no added sounds  CVS: NS1&S2, no murmurs, rubs or gallop  Abd: soft, NT, N,D BS+  EXT: warm, swelling over right hand with mild erythema, tender to pressure, 1+ pitting edema b/l  CNS: AAOX3,   Data Reviewed: Basic Metabolic Panel:  Recent Labs Lab 06/08/13 0032 06/08/13 0337 06/08/13 0815  NA 137 138 139  K 3.7 3.3* 3.6*  CL 97  96 99  CO2 25  --  29  GLUCOSE 89 91 89  BUN 36* 34* 34*  CREATININE 0.94 1.00 0.93  CALCIUM 10.3  --  10.0   Liver Function Tests:  Recent Labs Lab 06/08/13 0032  AST 91*  ALT 29  ALKPHOS 84  BILITOT 1.8*  PROT 7.0  ALBUMIN 3.1*   No results found for this basename: LIPASE, AMYLASE,  in the last 168 hours No  results found for this basename: AMMONIA,  in the last 168 hours CBC:  Recent Labs Lab 06/08/13 0032 06/08/13 0337  WBC 7.2  --   NEUTROABS 5.5  --   HGB 11.9* 12.6  HCT 37.2 37.0  MCV 90.5  --   PLT 138*  --    Cardiac Enzymes:  Recent Labs Lab 06/08/13 0032 06/08/13 0815  TROPONINI <0.30 <0.30   BNP (last 3 results)  Recent Labs  06/08/13 0032  PROBNP 4547.0*   CBG:  Recent Labs Lab 06/08/13 0622 06/08/13 1059 06/08/13 1634 06/08/13 2124  GLUCAP 81 82 83 98    No results found for this or any previous visit (from the past 240 hour(s)).   Studies: Dg Abd 1 View  06/08/2013   CLINICAL DATA:  Abdominal swelling and constipation.  EXAM: ABDOMEN - 1 VIEW  COMPARISON:  CT ABD W/CM dated 02/02/2006; DG LUMBAR SPINE COMPLETE dated 08/22/2004  FINDINGS: Gas and stool in the colon. No small or large bowel distention. Postoperative changes in the pelvis. Degenerative changes in the lumbar spine with mild scoliosis convex towards the right. No radiopaque stones.  IMPRESSION: Nonobstructive bowel gas pattern.   Electronically Signed   By: Lucienne Capers M.D.   On: 06/08/2013 02:52   Dg Chest Port 1 View  06/08/2013   CLINICAL DATA:  Lower extremity edema. Patient has been unable to urinate for 3 days. No bowel movement for 3 weeks.  EXAM: PORTABLE CHEST - 1 VIEW  COMPARISON:  DG THORACIC SPINE W/SWIMMERS dated 03/08/2013; DG CHEST 2 VIEW dated 03/12/2008  FINDINGS: Shallow inspiration. Cardiac enlargement without significant pulmonary vascular congestion. No focal airspace disease or consolidation. No blunting of costophrenic angles. No pneumothorax. No significant change since previous study.  IMPRESSION: Cardiac enlargement.  No evidence of edema or active disease.   Electronically Signed   By: Lucienne Capers M.D.   On: 06/08/2013 01:54   Dg Hand Complete Left  06/08/2013   CLINICAL DATA:  Redness and swelling in both hands. No known injury.  EXAM: LEFT HAND - COMPLETE 3+  VIEW  COMPARISON:  None.  FINDINGS: Degenerative changes in the radiocarpal, STT, first carpometacarpal, first metacarpal phalangeal, and multiple interphalangeal joints. Subcortical cyst demonstrated in the scaphoid bone and in the second proximal phalanx. No evidence of acute fracture or dislocation. No focal bone erosions. Mild dorsal soft tissue swelling.  IMPRESSION: Degenerative changes in the left hand. Soft tissue swelling. No acute bony abnormalities.   Electronically Signed   By: Lucienne Capers M.D.   On: 06/08/2013 02:51   Dg Hand Complete Right  06/08/2013   CLINICAL DATA:  Redness and swelling in both hands. No known injury.  EXAM: RIGHT HAND - COMPLETE 3+ VIEW  COMPARISON:  None.  FINDINGS: Degenerative changes in the radiocarpal, STT, first carpometacarpal, first metacarpophalangeal, and multiple interphalangeal joints. Dorsal soft tissue swelling. No focal bone erosion. No evidence of acute fracture or dislocation.  IMPRESSION: Degenerative changes in the right hand. Soft tissue swelling. No acute bony abnormalities.  Electronically Signed   By: Lucienne Capers M.D.   On: 06/08/2013 02:52    Scheduled Meds: . aspirin EC  81 mg Oral QPM  . heparin  5,000 Units Subcutaneous 3 times per day  . levothyroxine  37.5 mcg Oral q morning - 10a  . LORazepam  2 mg Intravenous Once  . metoprolol succinate  50 mg Oral Daily  . morphine  60 mg Oral Q12H  . polyethylene glycol  17 g Oral Daily  . potassium chloride  20 mEq Oral BID  . senna-docusate  2 tablet Oral BID  . sodium chloride  3 mL Intravenous Q12H  . torsemide  40 mg Oral BID  . vancomycin  1,000 mg Intravenous Q24H   Continuous Infusions:     Time spent: 25 minutes   Nishant Dhungel  [facility] Pager [phone] If 7PM-7AM, please contact night-coverage at [email or web address], password [facility] 06/09/2013, 9:55 AM  LOS: 2 days

## 2013-06-09 NOTE — Evaluation (Signed)
Physical Therapy Evaluation Patient Details Name: Victoria Fisher MRN: [id number] DOB: 11/22/1934 Today's Date: 06/09/2013   History of Present Illness  Victoria Fisher is an 79 y.o. female with history of HTN, hyperlipidemia, CAD, MI, atrial fibrillation off anticoagulation due to frequent falls, COPD, pulmonary hypertension, OSA, noncompliant with CPAP, cor pulmonale, Milroy's disease, A. fib, IBS, chronic pain syndrome, anxiety, and recurrent falls admitted for suspected right hand cellulitis.  Clinical Impression  Pt adm due to the above. Presents with decreased independence with mobility and overall deconditioning. Pt with multiple falls at home and reports her husband has been having to physically (A) with all mobility and transfers recently. Pt to benefit from skilled acute PT to address deficits and maximize functional independence prior to D/C. At length discussion throughout session regarding SNF recommendations for post acute rehab. Pt adamant about refusing rehab at facility and is planning to return home with husband who has Alzheimer's. Pt would benefit from CSW consult to address questions regarding community programs and (A) available for her family.     Follow Up Recommendations Home health PT;Supervision/Assistance - 24 hour;Other (comment) (due to refusing SNF )    Equipment Recommendations  None recommended by PT    Recommendations for Other Services OT consult     Precautions / Restrictions Precautions Precautions: Fall Precaution Comments: reports multiple falls at home       Mobility  Bed Mobility Overal bed mobility: Modified Independent             General bed mobility comments: incr time to complete; use of handrails   Transfers Overall transfer level: Needs assistance Equipment used: Rolling walker (2 wheeled) Transfers: Sit to/from Stand Sit to Stand: Min guard         General transfer comment: pt transferred from sit to stand from bed and  from toilet; relied heavily on handicap handrail to elevate from lower toilet position; cues for hand placement and sequencing; required incr time to complete due to weakness   Ambulation/Gait Ambulation/Gait assistance: Min guard Ambulation Distance (Feet): 110 Feet Assistive device: Rolling walker (2 wheeled) Gait Pattern/deviations: Step-through pattern;Wide base of support;Trunk flexed;Decreased stride length Gait velocity: decreased Gait velocity interpretation: Below normal speed for age/gender General Gait Details: min guard required primarily with direction changes; pt has difficulty sequencing with RW and runs into objects at times; cues for RW management and gt sequencing safety to reduce risk of falls; encouraged pt to ambulate with RW vs SPC upon acute D/C.   Stairs            Wheelchair Mobility    Modified Rankin (Stroke Patients Only)       Balance Overall balance assessment: Needs assistance;History of Falls Sitting-balance support: Feet supported;No upper extremity supported Sitting balance-Leahy Scale: Fair     Standing balance support: During functional activity;Bilateral upper extremity supported;No upper extremity supported Standing balance-Leahy Scale: Fair Standing balance comment: pt stood at sink ~6 min to wash face and perform ADLs; progressed to no UE support with ADLsno LOB or sway noted             High level balance activites: Direction changes High Level Balance Comments: min guard to manage RW with direction changes             Pertinent Vitals/Pain Denies any pain.     Home Living Family/patient expects to be discharged to:: Private residence Living Arrangements: Spouse/significant other Available Help at Discharge: Available PRN/intermittently;Family;Friend(s) Type of Home: House Home Access:  Stairs to enter Entrance Stairs-Rails: Right;Left;Can reach both Entrance Stairs-Number of Steps: 2 Home Layout: One level Home  Equipment: Walker - 2 wheels;Grab bars - tub/shower;Grab bars - toilet;Cane - single point Additional Comments: reports husband has alzheimer's     Prior Function Level of Independence: Needs assistance   Gait / Transfers Assistance Needed: pt reports her husband will hold onto her while she walks with her cane; husband has to help her get in/out of car and up steps to enter house   ADL's / Homemaking Assistance Needed: has friend who comes in and cleans and cooks; report she gets in tub for shower independently   Comments: reports she has been relying on husband for mobility more      Hand Dominance        Extremity/Trunk Assessment   Upper Extremity Assessment: Defer to OT evaluation           Lower Extremity Assessment: Generalized weakness      Cervical / Trunk Assessment: Kyphotic  Communication   Communication: No difficulties  Cognition Arousal/Alertness: Awake/alert Behavior During Therapy: WFL for tasks assessed/performed Overall Cognitive Status: Within Functional Limits for tasks assessed                      General Comments General comments (skin integrity, edema, etc.): at length discussion regarding POC and D/C disposition; pt adamantly refusing SNF due to husband's condition; pt verbalizing she needs help with mobility but only agreeable to HHPT    Exercises        Assessment/Plan    PT Assessment Patient needs continued PT services  PT Diagnosis Abnormality of gait;Generalized weakness   PT Problem List Decreased strength;Decreased activity tolerance;Decreased balance;Decreased mobility;Decreased knowledge of use of DME;Decreased safety awareness;Pain  PT Treatment Interventions DME instruction;Gait training;Stair training;Functional mobility training;Therapeutic activities;Therapeutic exercise;Balance training;Neuromuscular re-education;Patient/family education   PT Goals (Current goals can be found in the Care Plan section) Acute Rehab PT  Goals Patient Stated Goal: to go home with my husband  PT Goal Formulation: With patient Time For Goal Achievement: 06/16/13 Potential to Achieve Goals: Good    Frequency Min 3X/week   Barriers to discharge Decreased caregiver support      Co-evaluation               End of Session Equipment Utilized During Treatment: Gait belt Activity Tolerance: Patient tolerated treatment well Patient left: in chair;with call bell/phone within reach Nurse Communication: Mobility status;Precautions         Time: 1791-5056 PT Time Calculation (min): 34 min   Charges:   PT Evaluation $Initial PT Evaluation Tier I: 1 Procedure PT Treatments $Gait Training: 8-22 mins $Therapeutic Activity: 8-22 mins   PT G CodesKennis Carina Fisher, [place] [phone] 06/09/2013, 11:15 AM

## 2013-06-09 NOTE — Evaluation (Signed)
Occupational Therapy Evaluation Patient Details Name: Victoria Fisher MRN: [id number] DOB: 02-Dec-1934 Today's Date: 06/09/2013    History of Present Illness Victoria Fisher is an 79 y.o. female with history of HTN, hyperlipidemia, CAD, MI, atrial fibrillation off anticoagulation due to frequent falls, COPD, pulmonary hypertension, OSA, noncompliant with CPAP, cor pulmonale, Milroy's disease, A. fib, IBS, chronic pain syndrome, anxiety, and recurrent falls admitted for suspected right hand cellulitis.   Clinical Impression   Pt. And family reports pt. Is at baseline for ADLs and mobility. Pt. Husband was A with LE dressing and showed pt. AE equipment. Pt. Was able to return demo and is going to purchase kit. No further skilled OT is needed at this time.     Follow Up Recommendations  Home health OT    Equipment Recommendations  Tub/shower bench    Recommendations for Other Services       Precautions / Restrictions Precautions Precautions: Fall Precaution Comments: reports multiple falls at home       Mobility Bed Mobility Overal bed mobility: Modified Independent             General bed mobility comments: incr time to complete; use of handrails   Transfers Overall transfer level: Needs assistance Equipment used: Rolling walker (2 wheeled) Transfers: Sit to/from Stand Sit to Stand: Min guard         General transfer comment: pt transferred from sit to stand from bed and from toilet; relied heavily on handicap handrail to elevate from lower toilet position; cues for hand placement and sequencing; required incr time to complete due to weakness     Balance Overall balance assessment: Needs assistance;History of Falls Sitting-balance support: Feet supported;No upper extremity supported Sitting balance-Leahy Scale: Fair     Standing balance support: During functional activity;Bilateral upper extremity supported;No upper extremity supported Standing balance-Leahy  Scale: Fair Standing balance comment: pt stood at sink ~6 min to wash face and perform ADLs; progressed to no UE support with ADLsno LOB or sway noted             High level balance activites: Direction changes High Level Balance Comments: min guard to manage RW with direction changes            ADL Overall ADL's : At baseline (ed. pt. on use of AE to increase I wtih ADLs at home.)                                       General ADL Comments:  (Pt. able to return demo use of AE.)     Vision                     Perception     Praxis      Pertinent Vitals/Pain No c/o pain.     Hand Dominance Right   Extremity/Trunk Assessment Upper Extremity Assessment Upper Extremity Assessment: Defer to OT evaluation RUE Deficits / Details:  (Pt. has 3/4 finger flex secondary to edema in hand.) RUE Sensation:  (intact) RUE Coordination: decreased fine motor   Lower Extremity Assessment Lower Extremity Assessment: Generalized weakness   Cervical / Trunk Assessment Cervical / Trunk Assessment: Kyphotic   Communication Communication Communication: No difficulties   Cognition Arousal/Alertness: Awake/alert Behavior During Therapy: WFL for tasks assessed/performed Overall Cognitive Status: Within Functional Limits for tasks assessed  General Comments       Exercises       Shoulder Instructions      Home Living Family/patient expects to be discharged to:: Private residence Living Arrangements: Spouse/significant other Available Help at Discharge: Available PRN/intermittently;Family;Friend(s) Type of Home: House Home Access: Stairs to enter [employer] of Steps: 2 Entrance Stairs-Rails: Right;Left;Can reach both Home Layout: One level     Bathroom Shower/Tub: Tub/shower unit;Curtain Shower/tub characteristics: Architectural technologist: Standard Bathroom Accessibility: Yes How Accessible: Accessible via  walker Home Equipment: Walker - 2 wheels;Grab bars - tub/shower;Grab bars - toilet;Cane - single point   Additional Comments: reports husband has alzheimer's       Prior Functioning/Environment Level of Independence: Needs assistance  Gait / Transfers Assistance Needed: pt reports her husband will hold onto her while she walks with her cane; husband has to help her get in/out of car and up steps to enter house  ADL's / Homemaking Assistance Needed: has friend who comes in and cleans and cooks; report she gets in tub for shower independently husband A with don/doff of socks and shoes secondary to edema.   Comments: reports she has been relying on husband for mobility more     OT Diagnosis:     OT Problem List:     OT Treatment/Interventions:      OT Goals(Current goals can be found in the care plan section) Acute Rehab OT Goals Patient Stated Goal: to go home with my husband   OT Frequency:     Barriers to D/C:            Co-evaluation              End of Session Equipment Utilized During Treatment: Rolling walker  Activity Tolerance: Patient tolerated treatment well Patient left: in bed;with call bell/phone within reach;with family/visitor present   Time: 0488-8916 OT Time Calculation (min): 54 min Charges:  OT General Charges $OT Visit: 1 Procedure OT Evaluation $Initial OT Evaluation Tier I: 1 Procedure OT Treatments $Self Care/Home Management : 38-52 mins G-Codes:    Audry Pili Jul 01, 2013, 11:34 AM

## 2013-06-10 ENCOUNTER — Inpatient Hospital Stay (HOSPITAL_COMMUNITY): Payer: Medicare Other

## 2013-06-10 DIAGNOSIS — M5030 Other cervical disc degeneration, unspecified cervical region: Secondary | ICD-10-CM | POA: Diagnosis not present

## 2013-06-10 DIAGNOSIS — Z9181 History of falling: Secondary | ICD-10-CM | POA: Diagnosis not present

## 2013-06-10 DIAGNOSIS — M47812 Spondylosis without myelopathy or radiculopathy, cervical region: Secondary | ICD-10-CM | POA: Diagnosis not present

## 2013-06-10 DIAGNOSIS — L02519 Cutaneous abscess of unspecified hand: Secondary | ICD-10-CM | POA: Diagnosis not present

## 2013-06-10 DIAGNOSIS — R930 Abnormal findings on diagnostic imaging of skull and head, not elsewhere classified: Secondary | ICD-10-CM | POA: Diagnosis not present

## 2013-06-10 LAB — GLUCOSE, CAPILLARY: Glucose-Capillary: 89 mg/dL (ref 70–99)

## 2013-06-10 MED ORDER — FLEET ENEMA 7-19 GM/118ML RE ENEM
1.0000 | ENEMA | Freq: Once | RECTAL | Status: AC
  Administered 2013-06-10: 1 via RECTAL
  Filled 2013-06-10: qty 1

## 2013-06-10 NOTE — Progress Notes (Signed)
[facility] PROGRESS NOTE  Victoria Fisher KGM:[id number] DOB: [birth year] DOA: 06/07/2013 PCP: Haywood Pao, MD  HPI/Brief narrative  79 year old female patient with history of COPD, pulmonary hypertension, OSA, noncompliant with CPAP, cor pulmonale, Milroy's disease, A. fib, IBS, chronic pain syndrome, not on anticoagulation secondary to recurrent falls, presented to ED on 06/08/13 with decreased urination, constipation, poor appetite and abdominal discomfort, stop taking torsemide secondary to constipation, baseline DOE, painful, swollen and red right hand. Admitted for suspected right hand cellulitis.   Assessment/Plan:  Suspected cellulitis of right hand and distal forearm:  She reports that she had similar but milder presentation of left hand 3 weeks ago which resolved spontaneously. Right hand symptoms began 4-5 days ago. She has several tiny cuts on her hands which she states are due to home activity and falls. Claims to have noticed right hand symptoms after moving wet clothes from washer after spouse had tried to declog the drain with chemical agent.  Continue IV vancomycin for now, elevate right upper extremity . Still has pain over rt hand but swelling and erythema much improving and confined to dorsum of hand and fingers only .  Frequent falls at home:  No reported dizziness or LOC. CT head 3/29 and CT C-spine 1/21 without acute findings. She has had several CT head and C-spine studies. Warfarin discontinued for now secondary to high fall risk. Discussed with primary cardiologist. Lisinopril and isosorbide were discontinued secondary to hypotension. Statins discontinued secondary to muscle weakness.  Neurology consulted. Has increased reflexes in UE. Recommends MRI brain and C spine which is pending  Permanent atrial fibrillation:  Rate controled,  Anticoagulation discontinued secondary to fall risk.  History of hypothyroidism:  TSH markedly elevated at 21.5. Increased  Synthroid from 25 > 37.5 mcg and followup TSH in 4-6 weeks.  OSA/moderate pulmonary hypertension/cor pulmonale:  Stable. Patient not compliant with CPAP. Has pitting B/l lower extremitiy seems chronic.  Chronic pain syndrome: Stable at present  Anemia and thrombocytopenia: Chronic and stable.  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     Code Status:full Family Communication: none at bedside Disposition Plan: patient refuses SNF. Possibly home with HHPT tomorrow      HPI/Subjective:  pain and swelling over rt hand  Much improved  Objective: Filed Vitals:   06/10/13 1029  BP: 109/62  Pulse: 66  Temp:   Resp: 18    Intake/Output Summary (Last 24 hours) at 06/10/13 1529 Last data filed at 06/10/13 1433  Gross per 24 hour  Intake    830 ml  Output   4475 ml  Net  -3645 ml   Filed Weights   06/08/13 0556 06/09/13 0403 06/10/13 0500  Weight: 85.821 kg (189 lb 3.2 oz) 84.4 kg (186 lb 1.1 oz) 81 kg (178 lb 9.2 oz)    Exam:   General: elderly female in NAD  HEENT: no pallor, moist oral mucosa  Chest: clear b/l, no added sounds  CVS: NS1&S2, no murmurs, rubs or gallop  Abd: soft, NT, N,D BS+  EXT: warm, improved swelling and erythema  over right hand , tender to pressure, 1+ pitting edema b/l  CNS: AAOX3,   Data Reviewed: Basic Metabolic Panel:  Recent Labs Lab 06/08/13 0032 06/08/13 0337 06/08/13 0815  NA 137  138 139  K 3.7 3.3* 3.6*  CL 97 96 99  CO2 25  --  29  GLUCOSE 89 91 89  BUN 36* 34* 34*  CREATININE 0.94 1.00 0.93  CALCIUM 10.3  --  10.0   Liver Function Tests:  Recent Labs Lab 06/08/13 0032  AST 91*  ALT 29  ALKPHOS 84  BILITOT 1.8*  PROT 7.0  ALBUMIN 3.1*   No results found for this basename: LIPASE, AMYLASE,   in the last 168 hours No results found for this basename: AMMONIA,  in the last 168 hours CBC:  Recent Labs Lab 06/08/13 0032 06/08/13 0337  WBC 7.2  --   NEUTROABS 5.5  --   HGB 11.9* 12.6  HCT 37.2 37.0  MCV 90.5  --   PLT 138*  --    Cardiac Enzymes:  Recent Labs Lab 06/08/13 0032 06/08/13 0815  TROPONINI <0.30 <0.30   BNP (last 3 results)  Recent Labs  06/08/13 0032  PROBNP 4547.0*   CBG:  Recent Labs Lab 06/08/13 0622 06/08/13 1059 06/08/13 1634 06/08/13 2124 06/09/13 2103  GLUCAP 81 82 83 98 108*    No results found for this or any previous visit (from the past 240 hour(s)).   Studies: Mr Brain Wo Contrast  06/10/2013   CLINICAL DATA:  Falling.  Abnormal reflexes.  EXAM: MRI HEAD WITHOUT CONTRAST  TECHNIQUE: Multiplanar, multiecho pulse sequences of the brain and surrounding structures were obtained without intravenous contrast.  COMPARISON:  Head CT 05/14/2013.  MRI 03/28/2008.  FINDINGS: The study suffers from moderate motion degradation. Diffusion imaging does not show any acute or subacute infarction. The brainstem and cerebellum are normal. The cerebral hemispheres are normal except for minimal chronic small vessel changes within the deep white matter. No cortical or large vessel territory infarction. No mass lesion, hemorrhage, hydrocephalus or extra-axial collection. No pituitary mass. No inflammatory sinus disease. No skull or skullbase lesion.  IMPRESSION: Motion degraded exam. No acute finding. Mild age related volume loss and small vessel change of the deep white matter, less than often seen in healthy individuals of this age.   Electronically Signed   By: Nelson Chimes M.D.   On: 06/10/2013 13:10   Mr Cervical Spine Wo Contrast  06/10/2013   CLINICAL DATA:  Frequent falls.  Abnormal reflexes.  EXAM: MRI CERVICAL SPINE WITHOUT CONTRAST  TECHNIQUE: Multiplanar, multisequence MR imaging was performed. No intravenous contrast was administered.   COMPARISON:  CT 03/08/2013.  FINDINGS: The foramen magnum is widely patent. There is ordinary osteoarthritis of the C1-2 level but no stenosis.  C2-3:  Mild facet degeneration.  No canal or foraminal stenosis.  C3-4: Spondylosis with endplate osteophytes and bulging of the disc. No compressive narrowing of the central canal. Mild foraminal narrowing on the left. Facet degeneration on the left which could be a cause of pain.  C4-5: Spondylosis without compressive narrowing of the canal or foramina.  C5-6: Chronic degenerative spondylosis with disc space narrowing. Chronic marrow changes at C5 and C6. No central canal stenosis. Mild foraminal narrowing bilaterally because of osteophytic encroachment.  C6-7:  Mild bulging of the disc.  No canal or foraminal stenosis.  C7-T1:  Mild bulging of the disc.  No canal or foraminal stenosis.  No cord lesion.  IMPRESSION: No abnormality seen to explain falling and abnormal reflexes. Ordinary mid cervical spondylosis without canal stenosis, cord compression or cord lesion. Degenerative spondylosis at the C5-6 level is chronic, and associated with sclerosis and marrow  edema. This could certainly relate to neck pain, but there is no compromise of the spinal canal proper. There is some foraminal narrowing bilaterally because of osteophytic encroachment.   Electronically Signed   By: Nelson Chimes M.D.   On: 06/10/2013 12:17    Scheduled Meds: . aspirin EC  81 mg Oral QPM  . heparin  5,000 Units Subcutaneous 3 times per day  . levothyroxine  37.5 mcg Oral q morning - 10a  . metoprolol succinate  50 mg Oral Daily  . morphine  60 mg Oral Q12H  . polyethylene glycol  17 g Oral Daily  . potassium chloride  20 mEq Oral BID  . senna-docusate  2 tablet Oral BID  . sodium chloride  3 mL Intravenous Q12H  . torsemide  40 mg Oral BID  . vancomycin  1,000 mg Intravenous Q24H   Continuous Infusions:     Time spent: 25 minutes   Otto Felkins  [facility] Pager  [phone] If 7PM-7AM, please contact night-coverage at [email or web address], password [facility] 06/10/2013, 3:29 PM  LOS: 3 days

## 2013-06-10 NOTE — Progress Notes (Signed)
PT Cancellation Note  Patient Details Name: Victoria Fisher MRN: [id number] DOB: 07-30-1934   Cancelled Treatment:    Reason Eval/Treat Not Completed: Fatigue/lethargy limiting ability to participate. Pt given ativan and lethargic. Will attempt to see pt in the morning. Per RN, pt continues to plan on D/C home.    [place], [place] 06/10/2013, 2:48 PM

## 2013-06-10 NOTE — Progress Notes (Signed)
Patient alert and oriented x4 throughout shift, occasionally requiring reorienting when drowsy but easily arousable after AM ativan administration (prior to MRI).  Patient received Fleet enema and had two small, loose bowel movements.  Patient up to bedside commode with one assist.  Vital signs stable.  Will continue to monitor.

## 2013-06-11 DIAGNOSIS — Z9181 History of falling: Secondary | ICD-10-CM | POA: Diagnosis not present

## 2013-06-11 DIAGNOSIS — I509 Heart failure, unspecified: Secondary | ICD-10-CM | POA: Diagnosis not present

## 2013-06-11 DIAGNOSIS — L02519 Cutaneous abscess of unspecified hand: Secondary | ICD-10-CM | POA: Diagnosis not present

## 2013-06-11 LAB — VANCOMYCIN, TROUGH: Vancomycin Tr: 19.5 ug/mL (ref 10.0–20.0)

## 2013-06-11 MED ORDER — LEVOTHYROXINE SODIUM 75 MCG PO TABS: 37.5000 ug | ORAL_TABLET | Freq: Every morning | ORAL | Status: DC

## 2013-06-11 MED ORDER — POLYETHYLENE GLYCOL 3350 17 G PO PACK: 17.0000 g | PACK | Freq: Every day | ORAL | Status: DC

## 2013-06-11 MED ORDER — DOXYCYCLINE HYCLATE 100 MG PO TABS: 100.0000 mg | ORAL_TABLET | Freq: Two times a day (BID) | ORAL | Status: DC

## 2013-06-11 MED ORDER — VANCOMYCIN HCL IN DEXTROSE 750-5 MG/150ML-% IV SOLN
750.0000 mg | INTRAVENOUS | Status: DC
  Administered 2013-06-11: 750 mg via INTRAVENOUS
  Filled 2013-06-11: qty 150

## 2013-06-11 NOTE — Discharge Instructions (Signed)
Cellulitis °Cellulitis is an infection of the skin and the tissue under the skin. The infected area is usually red and tender. This happens most often in the arms and lower legs. °HOME CARE  °· Take your antibiotic medicine as told. Finish the medicine even if you start to feel better. °· Keep the infected arm or leg raised (elevated). °· Put a warm cloth on the area up to 4 times per day. °· Only take medicines as told by your doctor. °· Keep all doctor visits as told. °GET HELP RIGHT AWAY IF:  °· You have a fever. °· You feel very sleepy. °· You throw up (vomit) or have watery poop (diarrhea). °· You feel sick and have muscle aches and pains. °· You see red streaks on the skin coming from the infected area. °· Your red area gets bigger or turns a dark color. °· Your bone or joint under the infected area is painful after the skin heals. °· Your infection comes back in the same area or different area. °· You have a puffy (swollen) bump in the infected area. °· You have new symptoms. °MAKE SURE YOU:  °· Understand these instructions. °· Will watch your condition. °· Will get help right away if you are not doing well or get worse. °Document Released: 07/22/2007 Document Revised: 08/04/2011 Document Reviewed: 04/20/2011 °[employer]® Patient Information ©2014 [employer]. ° °
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
## 2013-06-11 NOTE — Progress Notes (Signed)
ANTIBIOTIC CONSULT NOTE - FOLLOW UP  Pharmacy Consult for vancomycin Indication: cellulitis   Labs:  Recent Labs  06/08/13 0815  CREATININE 0.93   Estimated Creatinine Clearance: 51.8 ml/min (by C-G formula based on Cr of 0.93).  Recent Labs  06/11/13 0430  VANCOTROUGH 19.5      Assessment: 79yo female supratherapeutic on vancomycin with initial dosing for cellulitis; lab was drawn a little early, extrapolates to true trough ~17.  Goal of Therapy:  Vancomycin trough level 10-15 mcg/ml  Plan:  Will decrease vancomycin to 750mg  IV Q24H for calculated trough of ~13 and continue to monitor.  Wynona Neat, PharmD, BCPS  06/11/2013,6:34 AM

## 2013-06-11 NOTE — Progress Notes (Addendum)
CARE MANAGEMENT NOTE 06/11/2013  Patient:  Victoria Fisher, Victoria Fisher   Account Number:  [id number]  Date Initiated:  06/08/2013  Documentation initiated by:  [facility]  Subjective/Objective Assessment:   79 y.o. female with Past medical history of COPD, pulmonary HTN and sleep apnea noncompliant with CPAP, right-sided heart failure, Milroy disease, A. fib, IBS, chronic pain syndrome.//Home with spouse     Action/Plan:   IV abx//Access for [facility] services   Anticipated DC Date:  06/10/2013   Anticipated DC Plan:  [place]  CM consult      Choice offered to / List presented to:          [facility] arranged  HH-1 RN  [place]      [place]   Status of service:  Completed, signed off Medicare Important Message given?   (If response is "NO", the following Medicare IM given date fields will be blank) Date Medicare IM given:   Date Additional Medicare IM given:    Discharge Disposition:  [place]  Per UR Regulation:  Reviewed for med. necessity/level of care/duration of stay  If discussed at [place] of Stay Meetings, dates discussed:    Comments:  06/11/13 12:33 CM called [facility] to notify of discharge and spoke with  Marissa Nestle, RN,.  [facility] will render HHPT/OT/RN services.   No other CM needs were communicated.  Mariane Masters, BSN, [place] [phone].  06/08/13 [place], RN, BSN, [employer] [phone] Spoke with pt at bedside regarding discharge planning for [facility]. Offered pt list of home health agencies to choose from.  Pt chose [facility] to render services. Levonne Spiller of [facility] notified.  No DME needs identified at this time.

## 2013-06-11 NOTE — Plan of Care (Signed)
Problem: Phase I Progression Outcomes Goal: EF % per last Echo/documented,Core Reminder form on chart Outcome: Completed/Met Date Met:  06/11/13 55-60% as of 05/2013

## 2013-06-11 NOTE — Progress Notes (Signed)
Patient discharged to home with husband.  IV removed prior to discharge; IV site clean, dry, and intact.  Discharge instructions, education, and medications discussed with patient and patient's family members prior to discharge; all voiced understanding of discharge information.  Patient instructed to call MD office for follow-up appointment as indicated on discharge packet.

## 2013-06-12 ENCOUNTER — Ambulatory Visit (HOSPITAL_COMMUNITY): Payer: Medicare Other

## 2013-06-13 ENCOUNTER — Ambulatory Visit: Payer: Medicare Other | Admitting: Cardiovascular Disease

## 2013-06-13 DIAGNOSIS — I2510 Atherosclerotic heart disease of native coronary artery without angina pectoris: Secondary | ICD-10-CM | POA: Diagnosis not present

## 2013-06-13 DIAGNOSIS — F3289 Other specified depressive episodes: Secondary | ICD-10-CM | POA: Diagnosis not present

## 2013-06-13 DIAGNOSIS — J438 Other emphysema: Secondary | ICD-10-CM | POA: Diagnosis not present

## 2013-06-13 DIAGNOSIS — G894 Chronic pain syndrome: Secondary | ICD-10-CM | POA: Diagnosis not present

## 2013-06-13 DIAGNOSIS — I2789 Other specified pulmonary heart diseases: Secondary | ICD-10-CM | POA: Diagnosis not present

## 2013-06-13 DIAGNOSIS — Z9181 History of falling: Secondary | ICD-10-CM | POA: Diagnosis not present

## 2013-06-13 DIAGNOSIS — I5033 Acute on chronic diastolic (congestive) heart failure: Secondary | ICD-10-CM | POA: Diagnosis not present

## 2013-06-13 DIAGNOSIS — Q820 Hereditary lymphedema: Secondary | ICD-10-CM | POA: Diagnosis not present

## 2013-06-13 DIAGNOSIS — F411 Generalized anxiety disorder: Secondary | ICD-10-CM | POA: Diagnosis not present

## 2013-06-13 DIAGNOSIS — F329 Major depressive disorder, single episode, unspecified: Secondary | ICD-10-CM | POA: Diagnosis not present

## 2013-06-15 DIAGNOSIS — Q820 Hereditary lymphedema: Secondary | ICD-10-CM | POA: Diagnosis not present

## 2013-06-15 DIAGNOSIS — I2510 Atherosclerotic heart disease of native coronary artery without angina pectoris: Secondary | ICD-10-CM | POA: Diagnosis not present

## 2013-06-15 DIAGNOSIS — I2789 Other specified pulmonary heart diseases: Secondary | ICD-10-CM | POA: Diagnosis not present

## 2013-06-15 DIAGNOSIS — F329 Major depressive disorder, single episode, unspecified: Secondary | ICD-10-CM | POA: Diagnosis not present

## 2013-06-15 DIAGNOSIS — F3289 Other specified depressive episodes: Secondary | ICD-10-CM | POA: Diagnosis not present

## 2013-06-15 DIAGNOSIS — J438 Other emphysema: Secondary | ICD-10-CM | POA: Diagnosis not present

## 2013-06-15 DIAGNOSIS — I5033 Acute on chronic diastolic (congestive) heart failure: Secondary | ICD-10-CM | POA: Diagnosis not present

## 2013-06-16 DIAGNOSIS — I5033 Acute on chronic diastolic (congestive) heart failure: Secondary | ICD-10-CM | POA: Diagnosis not present

## 2013-06-16 DIAGNOSIS — J438 Other emphysema: Secondary | ICD-10-CM | POA: Diagnosis not present

## 2013-06-16 DIAGNOSIS — F329 Major depressive disorder, single episode, unspecified: Secondary | ICD-10-CM | POA: Diagnosis not present

## 2013-06-16 DIAGNOSIS — F3289 Other specified depressive episodes: Secondary | ICD-10-CM | POA: Diagnosis not present

## 2013-06-16 DIAGNOSIS — I2789 Other specified pulmonary heart diseases: Secondary | ICD-10-CM | POA: Diagnosis not present

## 2013-06-16 DIAGNOSIS — Q820 Hereditary lymphedema: Secondary | ICD-10-CM | POA: Diagnosis not present

## 2013-06-16 DIAGNOSIS — I2510 Atherosclerotic heart disease of native coronary artery without angina pectoris: Secondary | ICD-10-CM | POA: Diagnosis not present

## 2013-06-19 DIAGNOSIS — Q820 Hereditary lymphedema: Secondary | ICD-10-CM | POA: Diagnosis not present

## 2013-06-19 DIAGNOSIS — F329 Major depressive disorder, single episode, unspecified: Secondary | ICD-10-CM | POA: Diagnosis not present

## 2013-06-19 DIAGNOSIS — I5033 Acute on chronic diastolic (congestive) heart failure: Secondary | ICD-10-CM | POA: Diagnosis not present

## 2013-06-19 DIAGNOSIS — I2510 Atherosclerotic heart disease of native coronary artery without angina pectoris: Secondary | ICD-10-CM | POA: Diagnosis not present

## 2013-06-19 DIAGNOSIS — F3289 Other specified depressive episodes: Secondary | ICD-10-CM | POA: Diagnosis not present

## 2013-06-19 DIAGNOSIS — J438 Other emphysema: Secondary | ICD-10-CM | POA: Diagnosis not present

## 2013-06-19 DIAGNOSIS — I2789 Other specified pulmonary heart diseases: Secondary | ICD-10-CM | POA: Diagnosis not present

## 2013-06-20 ENCOUNTER — Ambulatory Visit (INDEPENDENT_AMBULATORY_CARE_PROVIDER_SITE_OTHER): Payer: Medicare Other | Admitting: Neurology

## 2013-06-20 ENCOUNTER — Encounter (INDEPENDENT_AMBULATORY_CARE_PROVIDER_SITE_OTHER): Payer: Self-pay

## 2013-06-20 ENCOUNTER — Encounter: Payer: Self-pay | Admitting: Neurology

## 2013-06-20 VITALS — BP 105/62 | HR 71 | Resp 18 | Wt 161.0 lb

## 2013-06-20 DIAGNOSIS — G4730 Sleep apnea, unspecified: Secondary | ICD-10-CM | POA: Diagnosis not present

## 2013-06-20 DIAGNOSIS — N189 Chronic kidney disease, unspecified: Secondary | ICD-10-CM

## 2013-06-20 DIAGNOSIS — I509 Heart failure, unspecified: Secondary | ICD-10-CM

## 2013-06-20 DIAGNOSIS — R0902 Hypoxemia: Secondary | ICD-10-CM

## 2013-06-20 MED ORDER — PRENATAL VITAMINS 28-0.8 MG PO TABS: 28.0000 mg | ORAL_TABLET | Freq: Three times a day (TID) | ORAL | Status: DC

## 2013-06-20 NOTE — Progress Notes (Signed)
[facility]  Provider:  [name], [name]  Referring Provider: Haywood Pao, MD Primary Care Physician:  Haywood Pao, MD    Patient with untreated sleep apnea, CHF and chronic morphine therapy. Frequent falls.  HPI:  Victoria Fisher is a 79 y.o. female  Is seen here as a referral from Dr. Benn Moulder and  from Dr. Osborne Casco for a re- evaluation of sleep apnea.  Victoria Fisher is is reporting that she had frequent falls recently she fell place just in the first 2 weeks of April and after her visit with Dr. Loletha Grayer. at more falls in the meantime. Several of these falls have serious consequences she injured her head when she fell against the furniture a couple of times. Dr. Loletha Grayer. noted her back was covered in bruises and she had severe leg weakness. She has difficulties getting up to a stable standing position and difficulties with ambulation with ultra husband to systems. She denied dizziness and has not experienced any fainting. He is concerned that she has not been compliant with CPAP after being diagnosed in 2013 with apnea at [facility], and now would not be able to obtain a machine after such a long time interval. Dr Claiborne Billings has not seen her in follow up, but she missed an appointment.  The patient has mild coronary artery disease and a remote cardiac catheterization as well as a low risk nuclear perfusion study of the coronaries in June 2013. Right ventricle is moderately dilated and the left atrium is severely dilated. She now has mostly low blood pressures. She also has a history of COPD-emphysema. Unstable angina and atrial fibrillation and chronic anticoagulation. This together with coronary artery disease and dilated cardiomyopathy would make her a high risk patient for at [facility] and obstructive sleep apnea.  A polysomnogram from 09-25-11 documented  AHI of 53.6 apneas plus or and 57 during REM sleep. She has dyspnea on exertion CHF and  pulmonary hypertension. I reviewed also lipid panel and her metabolic profile. Doctors he is concerned about the folds by being on an anticoagulation regimen with warfarin. The patient is also not able to tolerate more diuretics in spite of her edema given that her blood pressure now is actually little. Low back pressure and dizziness could be related and could be the reason for her falls. Her recent CTs have not shown bleeding into the skull.  She has paralysis in her ring and small fingers and had numbness in her fingertips with Raynauds.      Review of Systems: Out of a complete 14 system review, the patient complains of only the following symptoms, and all other reviewed systems are negative. Here , she reports she  gets dizzy, with a spinning sensation, Vertigo . She endorsed joint swelling and joint pain, although old she has lost body weight but gained fluid weight is significant for and leg edema. She also states that she has constipation, recently developed the beginning of renal failure, blurred vision, hearing loss and skin rash. Numbness weakness dizziness anxiety and a feeling of not getting restful sleep. 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  Her surgical history is positive for colon cancer 1984 cholecystectomy 1984 appendectomy 1959 hysterectomy 1986 and she had another colon cancer in 1985.  In 1991 she developed ileus and was  to have the surgery to remove scar tissue in the abdomen.  Family history positive for lymphedema  in her brother, and in her father. CHF  In her father ,    Her regular bedtime is 23.00 hours , falls asleep with her back propped up or in a recliner. 2 times nocturia, her rising time is about 5 AM. She will have 4-5 hours of nocturnal sleep. She naps in daytime when ever she is at rest.  She recently fell out of a chair in her sleep.    Marland KitchenccEpworth  Tv for 20 minutes - 3  Reading for 20 min 3  As a  passenger in a car 2 Doesn't drive.  Luchtime - 1 Conversation 2  Drivig.      13 out of 18 Waiting room, 3      History   Social History  . Marital Status: Married    Spouse Name: Georgiann Mccoy    Number of Children: 3  . Years of Education: HS   Occupational History  .     Social History Main Topics  . Smoking status: Never Smoker   . Smokeless tobacco: Never Used  . Alcohol Use: No  . Drug Use: No  . Sexual Activity: No   Other Topics Concern  . Not on file   Social History Narrative   Patient is married Georgiann Mccoy) and lives at home with her husband.   Patient is retired.   Patient has three children and her husband has two children.   Patient is right-handed.   Patient does not drink any caffeine.   Patient has a high school education.    Family History  Problem Relation Age of Onset  . Heart disease Father   . Alzheimer's disease Mother     Past Medical History  Diagnosis Date  . Anxiety   . Arthritis   . COPD (chronic obstructive pulmonary disease)   . Emphysema of lung   . Tuberculosis   . MI (myocardial infarction) 1999    Unstable angina  . Lung nodule   . Diverticulosis of colon   . Depression   . Milroy's disease     Lymphatic disorder [facility] at [facility]  . Chronic anticoagulation     A fib. On Coumadin.  . IBS (irritable bowel syndrome)   . Edema of both legs     Venous Doppler 05/11/12 was normal & no evidence of thrombus or thrombophlebitis. Chronic LE edema related to both cor pulmonale & lymphedema, hyperlipidemia & HTN involving coronary atherosclerosis by cardiac cath in 2004. Cath showed a tiny old occlusion of the optional diagonal branch & also proximal LAD.  [name] Chronic pain syndrome   . Hyperlipidemia     On a statin. Catheterization 02/12/03 Showed a tiny old occlusion of the optional diagonal branch & also proximal LAD.  [name] Hypertension     Myoview 07/2011 EF = >55%. RV was moderately dilated. LA was severely dilated.  Catheterization 02/12/03 Showed a tiny old occlusion of the optional diagonal branch & also proximal LAD.  [name] Coronary artery disease   . Coronary atherosclerosis     Mild. Myoview 07/2011 EF = >55%. RV was moderately dilated. LA was severely dilated. Catheterization 02/12/03 Showed a tiny old occlusion of the optional diagonal branch & also proximal LAD.  [name] Chronic cor pulmonale     Most likely due to COPD (cannot rule out contribution of diastolic LV dysfunction). ECHO 07/30/11 pulmonary artery pressure was estimated to be 50-60% mmHg & there was moderate tricuspid insufficiency.  . Frequent falls     Possibly due to  orthostatic hypotension but we have not seen that on our visits. Recent fall 05/2012 with scalp hematoma, swollen left LE, & black eyes.  . Obstructive sleep apnea     Polysomnogram 09/25/11 AHI: 53.6/hr overall & 57.1/hr during REM sleep. Poor compliance previously with CPAP  . Pulmonary hypertension     Pullmonary hypertension, chronic cor pulmonale w/pulmonary artery pressures of 50&#x2011; 60 mmHg.  . CHF (congestive heart failure)     Acute on chronic, diastolic, improved. Predominantly right heart failure due to cor pulmonale & in turn this is most likely due to untreated OSA.  [name] Dyspnea on exertion     Reluctant to take diuretics  . Atrial fibrillation, chronic     On Coumadin & a beta-blocker  . Cancer     Carcinoma of sigmoid colon [place].  [name] Bowel obstruction     Caused by scar tissue  . Hypothyroidism 2002    On hormone replacement therapy  . Asthma     On nebulizer  . Insomnia     Past Surgical History  Procedure Laterality Date  . Appendectomy  1956  . Abdominal hysterectomy  1983  . Colon resection      2 times  . Cholecystectomy  1984  . Colon biopsy    . Cardiac catheterization  02/12/03    Showed a tiny old occlusion of the optional diagonal branch & also proximal LAD.  [name] Colonoscopy  07/23/10    Current Outpatient Prescriptions  Medication  Sig Dispense Refill  . aspirin EC 81 MG tablet Take 81 mg by mouth every evening.       . Cholecalciferol (VITAMIN D PO) Take 2 tablets by mouth every morning.      [name] doxycycline (VIBRA-TABS) 100 MG tablet Take 1 tablet (100 mg total) by mouth 2 (two) times daily.  12 tablet  0  . hydrocortisone cream 0.5 % Apply 1 application topically 2 (two) times daily as needed (for itchiness).      [name] levothyroxine (SYNTHROID, LEVOTHROID) 75 MCG tablet Take 0.5 tablets (37.5 mcg total) by mouth every morning.  30 tablet  0  . metoprolol succinate (TOPROL-XL) 50 MG 24 hr tablet Take 50 mg by mouth daily. Take with or immediately following a meal.      . morphine (MS CONTIN) 60 MG 12 hr tablet Take 60 mg by mouth every 12 (twelve) hours.      [name] morphine (MSIR) 30 MG tablet Take 30 mg by mouth daily as needed (breakthrough pain morning).       . nitroGLYCERIN (NITROLINGUAL) 0.4 MG/SPRAY spray Place 1 spray under the tongue every 5 (five) minutes as needed for chest pain.  12 g  1  . polyethylene glycol (MIRALAX / GLYCOLAX) packet Take 17 g by mouth daily.  14 each  0  . potassium chloride (K-DUR,KLOR-CON) 10 MEQ tablet Take 20 mEq by mouth 2 (two) times daily.      [name] senna (SENOKOT) 8.6 MG tablet Take 2-3 tablets by mouth daily.      [name] torsemide (DEMADEX) 20 MG tablet Take 40 mg by mouth 2 (two) times daily.      [name] zolpidem (AMBIEN CR) 6.25 MG CR tablet Take 6.25 mg by mouth at bedtime as needed for sleep.      . Prenatal Vit-Fe Fumarate-FA (PRENATAL VITAMINS) 28-0.8 MG TABS Take 28 mg by mouth 3 (three) times daily after meals.  90 tablet  3   No current facility-administered medications for  this visit.    Allergies as of 06/20/2013 - Review Complete 06/20/2013  Allergen Reaction Noted  . Codeine Anaphylaxis, Hives, and Nausea And Vomiting 07/17/2009  . Ciprofloxacin Hives and Nausea And Vomiting 05/07/2012  . Latex Hives and Rash 07/17/2009  . Penicillins Hives and Rash 07/17/2009  . Sulfonamide  derivatives Hives and Rash 07/17/2009    Vitals: BP 105/62  Pulse 71  Resp 18  Wt 161 lb (73.029 kg) Last Weight:  Wt Readings from Last 1 Encounters:  06/20/13 161 lb (73.029 kg)   Last Height:   Ht Readings from Last 1 Encounters:  06/08/13 5\' 5"  (1.651 m)    Physical exam:  General: The patient is awake, alert and appears not in acute distress. The patient is well groomed. She is pale, a little icteric , raynaud's fingers ,  Head: Normocephalic, atraumatic. Neck is supple. Mallampati, neck circumference: tongue with hunter's glossitis, red and lacquered. Pica , eats ice.   Cardiovascular:  irregular, with carotid bruit, and with distended neck veins. Respiratory:  The patient is short of breath.  Skin:  With evidence of  Severe lymphatic  Edema of Melroys type- indurating the skin and muscle,   Up to waist and gluteal region- Raynaud's in hands..  Trunk: BMI is  elevated and patient  has normal posture.  Neurologic exam : The patient is awake and alert, oriented to place and time.  Memory subjective  described as impaired.  There is a normal attention span & concentration ability. Speech is fluent without dysarthria, dysphonia or aphasia. Mood and affect are appropriate.  Cranial nerves: Pupils are equal and briskly reactive to light. Funduscopic exam without edema but palor, the patient has floaters in both eyes. Status post cataract .  Extraocular movements  in vertical and horizontal planes intact and without nystagmus. Visual fields by finger perimetry are intact. Hearing to finger rub intact.   Facial sensation intact to fine touch. Facial motor strength is symmetric and tongue and uvula move midline.  Motor exam:   Reduced strength in all extremities. weaker grip. Her ROM  In the lower extremities is severely limited due to lymphatic edema- not a muscle disease- she is basically stiff , as if wrapped in a gipsum cast.   Sensory:  Fine touch, pinprick and vibration  were tested in upper extremities.  Proprioception is tested in the upper extremities only. This was  normal.  Coordination: Rapid alternating movements in the fingers/hands is tested and normal. Finger-to-nose maneuver tested and normal without evidence of ataxia, dysmetria or tremor.  Gait and station: Patient walks with a cane for assistive device . The gait disorder and falls are not neurologically induced.  Deep tendon reflexes: in the upper and  extremities are symmetric and intact.    Assessment:  After physical and neurologic examination, review of laboratory studies, imaging, neurophysiology testing and pre-existing records, assessment is  10 Melroy's lymphedema causes gait and ROM limitation to the waist in below. [name]  2) This patient has likely iron deficiency, perhaps B deficiency , too. Glossitis, palor, skin changes in color and CHF.  Chronic GI blood loss ? Dr Juanda Chance follows her .  She should be able to take a prenatal vitamin with iron , bid with meals.  3) Sleep apnea is likely the only treatable condition - she would need to come in for a night attendet study. Since she is on Morphine and has CHF with  A fib - she needs CO2 and screening for  central apneas   Plan:  Treatment plan and additional workup :  SPLIT study , CO2 and o2 measure , titration to 02 if AHi is less than 15.   Ask Dr Osborne Casco for BLOOD CELL COUNT result.

## 2013-06-20 NOTE — Patient Instructions (Signed)

## 2013-06-21 DIAGNOSIS — I5033 Acute on chronic diastolic (congestive) heart failure: Secondary | ICD-10-CM | POA: Diagnosis not present

## 2013-06-21 DIAGNOSIS — F3289 Other specified depressive episodes: Secondary | ICD-10-CM | POA: Diagnosis not present

## 2013-06-21 DIAGNOSIS — I2510 Atherosclerotic heart disease of native coronary artery without angina pectoris: Secondary | ICD-10-CM | POA: Diagnosis not present

## 2013-06-21 DIAGNOSIS — I2789 Other specified pulmonary heart diseases: Secondary | ICD-10-CM | POA: Diagnosis not present

## 2013-06-21 DIAGNOSIS — Q820 Hereditary lymphedema: Secondary | ICD-10-CM | POA: Diagnosis not present

## 2013-06-21 DIAGNOSIS — J438 Other emphysema: Secondary | ICD-10-CM | POA: Diagnosis not present

## 2013-06-21 DIAGNOSIS — F329 Major depressive disorder, single episode, unspecified: Secondary | ICD-10-CM | POA: Diagnosis not present

## 2013-06-22 DIAGNOSIS — I5033 Acute on chronic diastolic (congestive) heart failure: Secondary | ICD-10-CM | POA: Diagnosis not present

## 2013-06-22 DIAGNOSIS — J438 Other emphysema: Secondary | ICD-10-CM | POA: Diagnosis not present

## 2013-06-22 DIAGNOSIS — F329 Major depressive disorder, single episode, unspecified: Secondary | ICD-10-CM | POA: Diagnosis not present

## 2013-06-22 DIAGNOSIS — I2789 Other specified pulmonary heart diseases: Secondary | ICD-10-CM | POA: Diagnosis not present

## 2013-06-22 DIAGNOSIS — F3289 Other specified depressive episodes: Secondary | ICD-10-CM | POA: Diagnosis not present

## 2013-06-22 DIAGNOSIS — I2510 Atherosclerotic heart disease of native coronary artery without angina pectoris: Secondary | ICD-10-CM | POA: Diagnosis not present

## 2013-06-22 DIAGNOSIS — Q820 Hereditary lymphedema: Secondary | ICD-10-CM | POA: Diagnosis not present

## 2013-06-23 DIAGNOSIS — I5033 Acute on chronic diastolic (congestive) heart failure: Secondary | ICD-10-CM | POA: Diagnosis not present

## 2013-06-23 DIAGNOSIS — J438 Other emphysema: Secondary | ICD-10-CM | POA: Diagnosis not present

## 2013-06-23 DIAGNOSIS — F329 Major depressive disorder, single episode, unspecified: Secondary | ICD-10-CM | POA: Diagnosis not present

## 2013-06-23 DIAGNOSIS — F3289 Other specified depressive episodes: Secondary | ICD-10-CM | POA: Diagnosis not present

## 2013-06-23 DIAGNOSIS — I2789 Other specified pulmonary heart diseases: Secondary | ICD-10-CM | POA: Diagnosis not present

## 2013-06-23 DIAGNOSIS — I2510 Atherosclerotic heart disease of native coronary artery without angina pectoris: Secondary | ICD-10-CM | POA: Diagnosis not present

## 2013-06-23 DIAGNOSIS — Q820 Hereditary lymphedema: Secondary | ICD-10-CM | POA: Diagnosis not present

## 2013-06-26 ENCOUNTER — Ambulatory Visit: Payer: Medicare Other | Admitting: Physician Assistant

## 2013-06-27 DIAGNOSIS — Q820 Hereditary lymphedema: Secondary | ICD-10-CM | POA: Diagnosis not present

## 2013-06-27 DIAGNOSIS — G4700 Insomnia, unspecified: Secondary | ICD-10-CM | POA: Diagnosis not present

## 2013-06-27 DIAGNOSIS — I2789 Other specified pulmonary heart diseases: Secondary | ICD-10-CM | POA: Diagnosis not present

## 2013-06-27 DIAGNOSIS — I2510 Atherosclerotic heart disease of native coronary artery without angina pectoris: Secondary | ICD-10-CM | POA: Diagnosis not present

## 2013-06-27 DIAGNOSIS — K5900 Constipation, unspecified: Secondary | ICD-10-CM | POA: Diagnosis not present

## 2013-06-27 DIAGNOSIS — Z7901 Long term (current) use of anticoagulants: Secondary | ICD-10-CM | POA: Diagnosis not present

## 2013-06-27 DIAGNOSIS — E785 Hyperlipidemia, unspecified: Secondary | ICD-10-CM | POA: Diagnosis not present

## 2013-06-27 DIAGNOSIS — I10 Essential (primary) hypertension: Secondary | ICD-10-CM | POA: Diagnosis not present

## 2013-06-28 DIAGNOSIS — Q820 Hereditary lymphedema: Secondary | ICD-10-CM | POA: Diagnosis not present

## 2013-06-28 DIAGNOSIS — J438 Other emphysema: Secondary | ICD-10-CM | POA: Diagnosis not present

## 2013-06-28 DIAGNOSIS — F3289 Other specified depressive episodes: Secondary | ICD-10-CM | POA: Diagnosis not present

## 2013-06-28 DIAGNOSIS — F329 Major depressive disorder, single episode, unspecified: Secondary | ICD-10-CM | POA: Diagnosis not present

## 2013-06-28 DIAGNOSIS — I2510 Atherosclerotic heart disease of native coronary artery without angina pectoris: Secondary | ICD-10-CM | POA: Diagnosis not present

## 2013-06-28 DIAGNOSIS — I2789 Other specified pulmonary heart diseases: Secondary | ICD-10-CM | POA: Diagnosis not present

## 2013-06-28 DIAGNOSIS — I5033 Acute on chronic diastolic (congestive) heart failure: Secondary | ICD-10-CM | POA: Diagnosis not present

## 2013-06-30 ENCOUNTER — Ambulatory Visit (HOSPITAL_COMMUNITY)
Admission: RE | Admit: 2013-06-30 | Discharge: 2013-06-30 | Disposition: A | Discharge status: Home / Self Care | Payer: Medicare Other | Source: Ambulatory Visit | Attending: Cardiovascular Disease | Admitting: Cardiovascular Disease

## 2013-06-30 ENCOUNTER — Other Ambulatory Visit: Payer: Self-pay | Admitting: *Deleted

## 2013-06-30 ENCOUNTER — Telehealth: Payer: Self-pay | Admitting: Cardiovascular Disease

## 2013-06-30 DIAGNOSIS — F329 Major depressive disorder, single episode, unspecified: Secondary | ICD-10-CM | POA: Diagnosis not present

## 2013-06-30 DIAGNOSIS — Q820 Hereditary lymphedema: Secondary | ICD-10-CM | POA: Diagnosis not present

## 2013-06-30 DIAGNOSIS — I359 Nonrheumatic aortic valve disorder, unspecified: Secondary | ICD-10-CM

## 2013-06-30 DIAGNOSIS — F3289 Other specified depressive episodes: Secondary | ICD-10-CM | POA: Diagnosis not present

## 2013-06-30 DIAGNOSIS — J438 Other emphysema: Secondary | ICD-10-CM | POA: Diagnosis not present

## 2013-06-30 DIAGNOSIS — I2510 Atherosclerotic heart disease of native coronary artery without angina pectoris: Secondary | ICD-10-CM | POA: Diagnosis not present

## 2013-06-30 DIAGNOSIS — I2789 Other specified pulmonary heart diseases: Secondary | ICD-10-CM | POA: Diagnosis not present

## 2013-06-30 DIAGNOSIS — I5033 Acute on chronic diastolic (congestive) heart failure: Secondary | ICD-10-CM | POA: Diagnosis not present

## 2013-06-30 DIAGNOSIS — I272 Pulmonary hypertension, unspecified: Secondary | ICD-10-CM

## 2013-06-30 MED ORDER — LEVOTHYROXINE SODIUM 112 MCG PO TABS: 112.0000 ug | ORAL_TABLET | Freq: Every morning | ORAL | Status: DC

## 2013-06-30 NOTE — Telephone Encounter (Signed)
Calling because the medication that Dr. Sallyanne Kuster has and the medication that Dr.Tisovec has her are not coinciding together. Victoria Fisher  does not know what she needs to be on . The pharmacist will not filled the medication until the doctors speak and make a decision on her medication.  Please call .Marland KitchenMarland Kitchen

## 2013-06-30 NOTE — Telephone Encounter (Signed)
levothyroxin changed to 112 mg per dr. Sallyanne Kuster and sent to [facility] on [address]

## 2013-07-04 DIAGNOSIS — F329 Major depressive disorder, single episode, unspecified: Secondary | ICD-10-CM | POA: Diagnosis not present

## 2013-07-04 DIAGNOSIS — I5033 Acute on chronic diastolic (congestive) heart failure: Secondary | ICD-10-CM | POA: Diagnosis not present

## 2013-07-04 DIAGNOSIS — J438 Other emphysema: Secondary | ICD-10-CM | POA: Diagnosis not present

## 2013-07-04 DIAGNOSIS — I2789 Other specified pulmonary heart diseases: Secondary | ICD-10-CM | POA: Diagnosis not present

## 2013-07-04 DIAGNOSIS — Q820 Hereditary lymphedema: Secondary | ICD-10-CM | POA: Diagnosis not present

## 2013-07-04 DIAGNOSIS — F3289 Other specified depressive episodes: Secondary | ICD-10-CM | POA: Diagnosis not present

## 2013-07-04 DIAGNOSIS — I2510 Atherosclerotic heart disease of native coronary artery without angina pectoris: Secondary | ICD-10-CM | POA: Diagnosis not present

## 2013-07-07 ENCOUNTER — Inpatient Hospital Stay (HOSPITAL_COMMUNITY)
Admission: EM | Admit: 2013-07-07 | Discharge: 2013-07-11 | DRG: 091 | Disposition: A | Discharge status: Home / Self Care | Payer: Medicare Other | Attending: Internal Medicine | Admitting: Internal Medicine

## 2013-07-07 ENCOUNTER — Encounter (HOSPITAL_COMMUNITY): Payer: Self-pay | Admitting: Emergency Medicine

## 2013-07-07 ENCOUNTER — Emergency Department (HOSPITAL_COMMUNITY): Payer: Medicare Other

## 2013-07-07 DIAGNOSIS — C187 Malignant neoplasm of sigmoid colon: Secondary | ICD-10-CM | POA: Diagnosis present

## 2013-07-07 DIAGNOSIS — J438 Other emphysema: Secondary | ICD-10-CM | POA: Diagnosis present

## 2013-07-07 DIAGNOSIS — R269 Unspecified abnormalities of gait and mobility: Principal | ICD-10-CM | POA: Diagnosis present

## 2013-07-07 DIAGNOSIS — M129 Arthropathy, unspecified: Secondary | ICD-10-CM | POA: Diagnosis present

## 2013-07-07 DIAGNOSIS — I951 Orthostatic hypotension: Secondary | ICD-10-CM | POA: Diagnosis present

## 2013-07-07 DIAGNOSIS — I10 Essential (primary) hypertension: Secondary | ICD-10-CM | POA: Diagnosis not present

## 2013-07-07 DIAGNOSIS — M25569 Pain in unspecified knee: Secondary | ICD-10-CM | POA: Diagnosis not present

## 2013-07-07 DIAGNOSIS — E039 Hypothyroidism, unspecified: Secondary | ICD-10-CM | POA: Diagnosis present

## 2013-07-07 DIAGNOSIS — Z8249 Family history of ischemic heart disease and other diseases of the circulatory system: Secondary | ICD-10-CM

## 2013-07-07 DIAGNOSIS — J45909 Unspecified asthma, uncomplicated: Secondary | ICD-10-CM | POA: Diagnosis present

## 2013-07-07 DIAGNOSIS — I079 Rheumatic tricuspid valve disease, unspecified: Secondary | ICD-10-CM | POA: Diagnosis present

## 2013-07-07 DIAGNOSIS — Q820 Hereditary lymphedema: Secondary | ICD-10-CM | POA: Diagnosis not present

## 2013-07-07 DIAGNOSIS — G4733 Obstructive sleep apnea (adult) (pediatric): Secondary | ICD-10-CM | POA: Diagnosis present

## 2013-07-07 DIAGNOSIS — K56609 Unspecified intestinal obstruction, unspecified as to partial versus complete obstruction: Secondary | ICD-10-CM | POA: Diagnosis not present

## 2013-07-07 DIAGNOSIS — S298XXA Other specified injuries of thorax, initial encounter: Secondary | ICD-10-CM | POA: Diagnosis not present

## 2013-07-07 DIAGNOSIS — IMO0002 Reserved for concepts with insufficient information to code with codable children: Secondary | ICD-10-CM | POA: Diagnosis present

## 2013-07-07 DIAGNOSIS — Z85038 Personal history of other malignant neoplasm of large intestine: Secondary | ICD-10-CM

## 2013-07-07 DIAGNOSIS — I509 Heart failure, unspecified: Secondary | ICD-10-CM | POA: Diagnosis present

## 2013-07-07 DIAGNOSIS — E785 Hyperlipidemia, unspecified: Secondary | ICD-10-CM | POA: Diagnosis present

## 2013-07-07 DIAGNOSIS — K589 Irritable bowel syndrome without diarrhea: Secondary | ICD-10-CM | POA: Diagnosis present

## 2013-07-07 DIAGNOSIS — Z7901 Long term (current) use of anticoagulants: Secondary | ICD-10-CM | POA: Diagnosis not present

## 2013-07-07 DIAGNOSIS — Z79890 Hormone replacement therapy (postmenopausal): Secondary | ICD-10-CM

## 2013-07-07 DIAGNOSIS — I252 Old myocardial infarction: Secondary | ICD-10-CM | POA: Diagnosis present

## 2013-07-07 DIAGNOSIS — F3289 Other specified depressive episodes: Secondary | ICD-10-CM | POA: Diagnosis present

## 2013-07-07 DIAGNOSIS — I4891 Unspecified atrial fibrillation: Secondary | ICD-10-CM | POA: Diagnosis not present

## 2013-07-07 DIAGNOSIS — T1490XA Injury, unspecified, initial encounter: Secondary | ICD-10-CM | POA: Diagnosis not present

## 2013-07-07 DIAGNOSIS — R627 Adult failure to thrive: Secondary | ICD-10-CM | POA: Diagnosis present

## 2013-07-07 DIAGNOSIS — I5033 Acute on chronic diastolic (congestive) heart failure: Secondary | ICD-10-CM | POA: Diagnosis present

## 2013-07-07 DIAGNOSIS — G894 Chronic pain syndrome: Secondary | ICD-10-CM | POA: Diagnosis present

## 2013-07-07 DIAGNOSIS — E876 Hypokalemia: Secondary | ICD-10-CM | POA: Diagnosis present

## 2013-07-07 DIAGNOSIS — I2510 Atherosclerotic heart disease of native coronary artery without angina pectoris: Secondary | ICD-10-CM | POA: Diagnosis present

## 2013-07-07 DIAGNOSIS — S0993XA Unspecified injury of face, initial encounter: Secondary | ICD-10-CM | POA: Diagnosis not present

## 2013-07-07 DIAGNOSIS — F411 Generalized anxiety disorder: Secondary | ICD-10-CM | POA: Diagnosis present

## 2013-07-07 DIAGNOSIS — I959 Hypotension, unspecified: Secondary | ICD-10-CM | POA: Diagnosis not present

## 2013-07-07 DIAGNOSIS — I2789 Other specified pulmonary heart diseases: Secondary | ICD-10-CM | POA: Diagnosis not present

## 2013-07-07 DIAGNOSIS — R609 Edema, unspecified: Secondary | ICD-10-CM | POA: Diagnosis not present

## 2013-07-07 DIAGNOSIS — F329 Major depressive disorder, single episode, unspecified: Secondary | ICD-10-CM | POA: Diagnosis present

## 2013-07-07 DIAGNOSIS — E860 Dehydration: Secondary | ICD-10-CM | POA: Diagnosis not present

## 2013-07-07 DIAGNOSIS — G4700 Insomnia, unspecified: Secondary | ICD-10-CM | POA: Diagnosis present

## 2013-07-07 DIAGNOSIS — S0990XA Unspecified injury of head, initial encounter: Secondary | ICD-10-CM | POA: Diagnosis not present

## 2013-07-07 DIAGNOSIS — R5381 Other malaise: Secondary | ICD-10-CM | POA: Diagnosis not present

## 2013-07-07 DIAGNOSIS — R5383 Other fatigue: Secondary | ICD-10-CM | POA: Diagnosis not present

## 2013-07-07 DIAGNOSIS — E43 Unspecified severe protein-calorie malnutrition: Secondary | ICD-10-CM | POA: Diagnosis not present

## 2013-07-07 DIAGNOSIS — S199XXA Unspecified injury of neck, initial encounter: Secondary | ICD-10-CM | POA: Diagnosis not present

## 2013-07-07 DIAGNOSIS — Z9181 History of falling: Secondary | ICD-10-CM

## 2013-07-07 DIAGNOSIS — R531 Weakness: Secondary | ICD-10-CM

## 2013-07-07 LAB — I-STAT CHEM 8, ED
BUN: 43 mg/dL — ABNORMAL HIGH (ref 6–23)
Calcium, Ion: 1.26 mmol/L (ref 1.13–1.30)
Chloride: 96 mEq/L (ref 96–112)
Creatinine, Ser: 1.3 mg/dL — ABNORMAL HIGH (ref 0.50–1.10)
Glucose, Bld: 92 mg/dL (ref 70–99)
HEMATOCRIT: 33 % — AB (ref 36.0–46.0)
Hemoglobin: 11.2 g/dL — ABNORMAL LOW (ref 12.0–15.0)
POTASSIUM: 3 meq/L — AB (ref 3.7–5.3)
SODIUM: 142 meq/L (ref 137–147)
TCO2: 29 mmol/L (ref 0–100)

## 2013-07-07 LAB — COMPREHENSIVE METABOLIC PANEL
ALT: 12 U/L (ref 0–35)
AST: 27 U/L (ref 0–37)
Albumin: 3.1 g/dL — ABNORMAL LOW (ref 3.5–5.2)
Alkaline Phosphatase: 89 U/L (ref 39–117)
BUN: 50 mg/dL — ABNORMAL HIGH (ref 6–23)
CALCIUM: 9.6 mg/dL (ref 8.4–10.5)
CO2: 29 mEq/L (ref 19–32)
Chloride: 97 mEq/L (ref 96–112)
Creatinine, Ser: 1.17 mg/dL — ABNORMAL HIGH (ref 0.50–1.10)
GFR calc non Af Amer: 44 mL/min — ABNORMAL LOW (ref >90)
GFR, EST AFRICAN AMERICAN: 51 mL/min — AB (ref >90)
GLUCOSE: 93 mg/dL (ref 70–99)
Potassium: 3 mEq/L — ABNORMAL LOW (ref 3.7–5.3)
Sodium: 139 mEq/L (ref 137–147)
TOTAL PROTEIN: 6.5 g/dL (ref 6.0–8.3)
Total Bilirubin: 0.8 mg/dL (ref 0.3–1.2)

## 2013-07-07 LAB — URINALYSIS, ROUTINE W REFLEX MICROSCOPIC
Bilirubin Urine: NEGATIVE
Glucose, UA: NEGATIVE mg/dL
Hgb urine dipstick: NEGATIVE
Ketones, ur: NEGATIVE mg/dL
LEUKOCYTES UA: NEGATIVE
Nitrite: NEGATIVE
PROTEIN: NEGATIVE mg/dL
Specific Gravity, Urine: 1.01 (ref 1.005–1.030)
UROBILINOGEN UA: 1 mg/dL (ref 0.0–1.0)
pH: 7 (ref 5.0–8.0)

## 2013-07-07 LAB — PROTIME-INR
INR: 1.15 (ref 0.00–1.49)
Prothrombin Time: 14.5 seconds (ref 11.6–15.2)

## 2013-07-07 LAB — CBC
HCT: 29.8 % — ABNORMAL LOW (ref 36.0–46.0)
Hemoglobin: 9.8 g/dL — ABNORMAL LOW (ref 12.0–15.0)
MCH: 29.8 pg (ref 26.0–34.0)
MCHC: 32.9 g/dL (ref 30.0–36.0)
MCV: 90.6 fL (ref 78.0–100.0)
Platelets: 107 10*3/uL — ABNORMAL LOW (ref 150–400)
RBC: 3.29 MIL/uL — AB (ref 3.87–5.11)
RDW: 18.3 % — ABNORMAL HIGH (ref 11.5–15.5)
WBC: 4.5 10*3/uL (ref 4.0–10.5)

## 2013-07-07 LAB — I-STAT CG4 LACTIC ACID, ED: Lactic Acid, Venous: 0.77 mmol/L (ref 0.5–2.2)

## 2013-07-07 MED ORDER — TORSEMIDE 20 MG PO TABS
40.0000 mg | ORAL_TABLET | Freq: Two times a day (BID) | ORAL | Status: DC
  Administered 2013-07-07 – 2013-07-11 (×9): 40 mg via ORAL
  Filled 2013-07-07 (×11): qty 2

## 2013-07-07 MED ORDER — ALBUTEROL SULFATE (2.5 MG/3ML) 0.083% IN NEBU: 2.5000 mg | INHALATION_SOLUTION | RESPIRATORY_TRACT | Status: DC | PRN

## 2013-07-07 MED ORDER — ENSURE COMPLETE PO LIQD
237.0000 mL | Freq: Two times a day (BID) | ORAL | Status: DC
  Administered 2013-07-07 – 2013-07-11 (×9): 237 mL via ORAL

## 2013-07-07 MED ORDER — VITAMIN D3 25 MCG (1000 UNIT) PO TABS
1000.0000 [IU] | ORAL_TABLET | Freq: Every day | ORAL | Status: DC
  Administered 2013-07-07 – 2013-07-09 (×3): 1000 [IU] via ORAL
  Filled 2013-07-07 (×4): qty 1

## 2013-07-07 MED ORDER — ACETAMINOPHEN 325 MG PO TABS: 650.0000 mg | ORAL_TABLET | Freq: Four times a day (QID) | ORAL | Status: DC | PRN

## 2013-07-07 MED ORDER — ENOXAPARIN SODIUM 40 MG/0.4ML ~~LOC~~ SOLN
40.0000 mg | SUBCUTANEOUS | Status: DC
  Filled 2013-07-07: qty 0.4

## 2013-07-07 MED ORDER — ALUM & MAG HYDROXIDE-SIMETH 200-200-20 MG/5ML PO SUSP: 30.0000 mL | Freq: Four times a day (QID) | ORAL | Status: DC | PRN

## 2013-07-07 MED ORDER — MAGNESIUM OXIDE 400 (241.3 MG) MG PO TABS
400.0000 mg | ORAL_TABLET | Freq: Every day | ORAL | Status: DC
  Administered 2013-07-07 – 2013-07-11 (×5): 400 mg via ORAL
  Filled 2013-07-07 (×5): qty 1

## 2013-07-07 MED ORDER — NITROGLYCERIN 0.4 MG/SPRAY TL SOLN
1.0000 | Status: DC | PRN
  Filled 2013-07-07: qty 4.9

## 2013-07-07 MED ORDER — POTASSIUM CHLORIDE 10 MEQ/100ML IV SOLN
10.0000 meq | Freq: Once | INTRAVENOUS | Status: AC
  Administered 2013-07-07: 10 meq via INTRAVENOUS
  Filled 2013-07-07: qty 100

## 2013-07-07 MED ORDER — ONDANSETRON HCL 4 MG/2ML IJ SOLN: 4.0000 mg | Freq: Four times a day (QID) | INTRAMUSCULAR | Status: DC | PRN

## 2013-07-07 MED ORDER — ONDANSETRON HCL 4 MG PO TABS: 4.0000 mg | ORAL_TABLET | Freq: Four times a day (QID) | ORAL | Status: DC | PRN

## 2013-07-07 MED ORDER — POLYETHYLENE GLYCOL 3350 17 G PO PACK
17.0000 g | PACK | Freq: Every day | ORAL | Status: DC
  Administered 2013-07-07 – 2013-07-11 (×5): 17 g via ORAL
  Filled 2013-07-07 (×5): qty 1

## 2013-07-07 MED ORDER — ACETAMINOPHEN 650 MG RE SUPP: 650.0000 mg | Freq: Four times a day (QID) | RECTAL | Status: DC | PRN

## 2013-07-07 MED ORDER — POTASSIUM CHLORIDE CRYS ER 20 MEQ PO TBCR
20.0000 meq | EXTENDED_RELEASE_TABLET | Freq: Two times a day (BID) | ORAL | Status: DC
  Administered 2013-07-07 – 2013-07-11 (×9): 20 meq via ORAL
  Filled 2013-07-07 (×12): qty 1

## 2013-07-07 MED ORDER — LEVOTHYROXINE SODIUM 112 MCG PO TABS
112.0000 ug | ORAL_TABLET | Freq: Every morning | ORAL | Status: DC
  Administered 2013-07-07 – 2013-07-11 (×5): 112 ug via ORAL
  Filled 2013-07-07 (×5): qty 1

## 2013-07-07 MED ORDER — ATORVASTATIN CALCIUM 20 MG PO TABS
20.0000 mg | ORAL_TABLET | Freq: Every day | ORAL | Status: DC
  Administered 2013-07-07: 20 mg via ORAL
  Filled 2013-07-07 (×2): qty 1

## 2013-07-07 MED ORDER — POTASSIUM CHLORIDE CRYS ER 20 MEQ PO TBCR
40.0000 meq | EXTENDED_RELEASE_TABLET | Freq: Once | ORAL | Status: AC
  Administered 2013-07-07: 40 meq via ORAL
  Filled 2013-07-07: qty 2

## 2013-07-07 MED ORDER — MORPHINE SULFATE ER 15 MG PO TBCR
60.0000 mg | EXTENDED_RELEASE_TABLET | Freq: Two times a day (BID) | ORAL | Status: DC
  Administered 2013-07-07 – 2013-07-11 (×9): 60 mg via ORAL
  Filled 2013-07-07 (×9): qty 4

## 2013-07-07 MED ORDER — ZOLPIDEM TARTRATE 5 MG PO TABS: 5.0000 mg | ORAL_TABLET | Freq: Every evening | ORAL | Status: DC | PRN

## 2013-07-07 MED ORDER — LISINOPRIL 20 MG PO TABS
20.0000 mg | ORAL_TABLET | Freq: Every day | ORAL | Status: DC
  Administered 2013-07-07: 20 mg via ORAL
  Filled 2013-07-07 (×2): qty 1

## 2013-07-07 MED ORDER — METOPROLOL SUCCINATE ER 50 MG PO TB24
50.0000 mg | ORAL_TABLET | Freq: Every day | ORAL | Status: DC
  Administered 2013-07-07 – 2013-07-08 (×2): 50 mg via ORAL
  Filled 2013-07-07 (×3): qty 1

## 2013-07-07 MED ORDER — SODIUM CHLORIDE 0.9 % IV BOLUS (SEPSIS)
500.0000 mL | Freq: Once | INTRAVENOUS | Status: AC
  Administered 2013-07-07: 500 mL via INTRAVENOUS

## 2013-07-07 MED ORDER — ASPIRIN EC 81 MG PO TBEC
81.0000 mg | DELAYED_RELEASE_TABLET | Freq: Every evening | ORAL | Status: DC
  Administered 2013-07-07 – 2013-07-10 (×4): 81 mg via ORAL
  Filled 2013-07-07 (×6): qty 1

## 2013-07-07 MED ORDER — MORPHINE SULFATE 15 MG PO TABS: 30.0000 mg | ORAL_TABLET | Freq: Every day | ORAL | Status: DC | PRN

## 2013-07-07 MED ORDER — POTASSIUM CHLORIDE IN NACL 20-0.9 MEQ/L-% IV SOLN
INTRAVENOUS | Status: DC
  Administered 2013-07-07 – 2013-07-08 (×2): via INTRAVENOUS
  Filled 2013-07-07 (×3): qty 1000

## 2013-07-07 MED ORDER — SENNOSIDES 8.6 MG PO TABS
2.0000 | ORAL_TABLET | Freq: Every day | ORAL | Status: DC
  Administered 2013-07-07 – 2013-07-11 (×5): 17.2 mg via ORAL
  Filled 2013-07-07 (×5): qty 2

## 2013-07-07 NOTE — Progress Notes (Signed)
Patient evaluated for community based chronic disease management services with [facility] as a benefit of patient's [employer]. Spoke with patient and son Thomasenia Bottoms [phone]) at bedside to explain [facility] services.  Patient may benefit from nutritional supplements.  Written consent has been obtained. Her son is her authorized contact.  Patient will receive a post discharge transition of care call and will be evaluated for monthly home visits for assessments and disease process education.  Left contact information and [facility] literature at bedside. Made Inpatient Case Manager aware that [facility] following. Of note, [facility] services does not replace or interfere with any services that are arranged by inpatient case management or social work.  For additional questions or referrals please contact Corliss Blacker BSN RN [facility] Liaison at [phone].

## 2013-07-07 NOTE — Progress Notes (Signed)
INITIAL NUTRITION ASSESSMENT  DOCUMENTATION CODES Per approved criteria  -Severe  malnutrition in the context of social or environmental circumstances   INTERVENTION: Add Ensure Complete po BID, each supplement provides 350 kcal and 13 grams of protein. RD to continue to follow nutrition care plan.  NUTRITION DIAGNOSIS: Unintentional wt loss related to limited self care as evidenced by wt loss and poor po intake.   Goal: Intake to meet >90% of estimated nutrition needs.  Monitor:  weight trends, lab trends, I/O's, PO intake, supplement tolerance  Reason for Assessment: MD Consult for Nutrition Assessment  79 y.o. female  Admitting Dx: FTT  ASSESSMENT: PMHx significant for anxiety, COPD, diverticulosis, CAD and Milroy disease Admitted s/p several falls and inability to take care of herself. Work-up reveals FTT.   Per H&P, pt reports that she cannot find time to eat or drink because she is so busy. She reports that all she will eat in a day is crackers with a slice of cheese - seems overwhelmed with caring for herself and her husband.  Per our records, pt with 23% wt loss x 1 month. She confirms this weight loss and suspects that is fluid and true weight loss. Reports that this is the lowest that she has been in a long time. Pt reports that she cannot remember the last time she ate 3 meals in 1 day. She stated that she ate >75% of her breakfast this morning.  Nutrition Focused Physical Exam:  Subcutaneous Fat:  Orbital Region: moderate depletion Upper Arm Region: severe depletion Thoracic and Lumbar Region: severe depletion  Muscle:  Temple Region: moderate to severe depletion Clavicle Bone Region: severe depletion Clavicle and Acromion Bone Region: severe depletion Scapular Bone Region: n/a Dorsal Hand: WNL Patellar Region: chronic edema Anterior Thigh Region: chronic edema Posterior Calf Region: chronic edema  Edema: LE edema 2/2 Milroy's disease  Pt meets criteria  for severe MALNUTRITION in the context of social/environmental circumstances as evidenced by intake of <50% x at least 1 month, severe fat and muscle depletion, and >5% wt loss x 1 month.  Potassium low at 3.0  Height: Ht Readings from Last 1 Encounters:  07/07/13 5\' 5"  (1.651 m)    Weight: Wt Readings from Last 1 Encounters:  07/07/13 151 lb 0.2 oz (68.5 kg)    Ideal Body Weight: 125 lb  % Ideal Body Weight: 121%  Wt Readings from Last 10 Encounters:  07/07/13 151 lb 0.2 oz (68.5 kg)  06/20/13 161 lb (73.029 kg)  06/11/13 168 lb 6.4 oz (76.386 kg)  05/30/13 195 lb 8 oz (88.678 kg)  11/01/12 196 lb (88.905 kg)  09/13/12 195 lb 3.2 oz (88.542 kg)  02/24/10 233 lb 4 oz (105.802 kg)  07/18/09 220 lb (99.791 kg)    Usual Body Weight: 195 lb  % Usual Body Weight: 77%  BMI:  Body mass index is 25.13 kg/(m^2). Overweight  Estimated Nutritional Needs: Kcal: 1300 - 1500 Protein: 65 - 75 g Fluid: approx 1.5 liters daily  Skin: intact  Diet Order: Cardiac  EDUCATION NEEDS: -No education needs identified at this time   Intake/Output Summary (Last 24 hours) at 07/07/13 0927 Last data filed at 07/07/13 0323  Gross per 24 hour  Intake    600 ml  Output      0 ml  Net    600 ml    Last BM: PTA  Labs:   Recent Labs Lab 07/07/13 0154 07/07/13 0200  NA 139 142  K 3.0*  3.0*  CL 97 96  CO2 29  --   BUN 50* 43*  CREATININE 1.17* 1.30*  CALCIUM 9.6  --   GLUCOSE 93 92    CBG (last 3)  No results found for this basename: GLUCAP,  in the last 72 hours  Scheduled Meds: . aspirin EC  81 mg Oral QPM  . atorvastatin  20 mg Oral q1800  . cholecalciferol  1,000 Units Oral Daily  . levothyroxine  112 mcg Oral q morning - 10a  . lisinopril  20 mg Oral Daily  . magnesium oxide  400 mg Oral Daily  . metoprolol succinate  50 mg Oral Daily  . morphine  60 mg Oral Q12H  . polyethylene glycol  17 g Oral Daily  . potassium chloride  20 mEq Oral BID  . senna  2 tablet  Oral Daily  . torsemide  40 mg Oral BID    Continuous Infusions: . 0.9 % NaCl with KCl 20 mEq / L      Past Medical History  Diagnosis Date  . Anxiety   . Arthritis   . COPD (chronic obstructive pulmonary disease)   . Emphysema of lung   . Tuberculosis   . MI (myocardial infarction) 1999    Unstable angina  . Lung nodule   . Diverticulosis of colon   . Depression   . Milroy's disease     Lymphatic disorder [facility] at [facility]  . Chronic anticoagulation     A fib. On Coumadin.  . IBS (irritable bowel syndrome)   . Edema of both legs     Venous Doppler 05/11/12 was normal & no evidence of thrombus or thrombophlebitis. Chronic LE edema related to both cor pulmonale & lymphedema, hyperlipidemia & HTN involving coronary atherosclerosis by cardiac cath in 2004. Cath showed a tiny old occlusion of the optional diagonal branch & also proximal LAD.  [name] Chronic pain syndrome   . Hyperlipidemia     On a statin. Catheterization 02/12/03 Showed a tiny old occlusion of the optional diagonal branch & also proximal LAD.  [name] Hypertension     Myoview 07/2011 EF = >55%. RV was moderately dilated. LA was severely dilated. Catheterization 02/12/03 Showed a tiny old occlusion of the optional diagonal branch & also proximal LAD.  [name] Coronary artery disease   . Coronary atherosclerosis     Mild. Myoview 07/2011 EF = >55%. RV was moderately dilated. LA was severely dilated. Catheterization 02/12/03 Showed a tiny old occlusion of the optional diagonal branch & also proximal LAD.  [name] Chronic cor pulmonale     Most likely due to COPD (cannot rule out contribution of diastolic LV dysfunction). ECHO 07/30/11 pulmonary artery pressure was estimated to be 50-60% mmHg & there was moderate tricuspid insufficiency.  . Frequent falls     Possibly due to orthostatic hypotension but we have not seen that on our visits. Recent fall 05/2012 with scalp hematoma, swollen left LE, & black eyes.  . Obstructive sleep apnea      Polysomnogram 09/25/11 AHI: 53.6/hr overall & 57.1/hr during REM sleep. Poor compliance previously with CPAP  . Pulmonary hypertension     Pullmonary hypertension, chronic cor pulmonale w/pulmonary artery pressures of 50&#x2011; 60 mmHg.  . CHF (congestive heart failure)     Acute on chronic, diastolic, improved. Predominantly right heart failure due to cor pulmonale & in turn this is most likely due to untreated OSA.  [name] Dyspnea on exertion     Reluctant to take  diuretics  . Atrial fibrillation, chronic     On Coumadin & a beta-blocker  . Cancer     Carcinoma of sigmoid colon [place].  [name] Bowel obstruction     Caused by scar tissue  . Hypothyroidism 2002    On hormone replacement therapy  . Asthma     On nebulizer  . Insomnia     Past Surgical History  Procedure Laterality Date  . Appendectomy  1956  . Abdominal hysterectomy  1983  . Colon resection      2 times  . Cholecystectomy  1984  . Colon biopsy    . Cardiac catheterization  02/12/03    Showed a tiny old occlusion of the optional diagonal branch & also proximal LAD.  [name] Colonoscopy  07/23/10    Inda Coke MS, RD, LDN Inpatient Registered Dietitian Pager: [phone] After-hours pager: [phone]

## 2013-07-07 NOTE — ED Notes (Signed)
Secured belongings in safe: [phone]

## 2013-07-07 NOTE — Evaluation (Signed)
Occupational Therapy Evaluation Patient Details Name: Victoria HORNEY MRN: [id number] DOB: [birth year] Today's Date: 07/07/2013    History of Present Illness Pt was admitted for FTT/frequent falls.     Clinical Impression   This 79 year old female was admitted for the above.  She is the caregiver for her husband but she reports that he assists her with LB adls.  She was mod I with RW for toilet transfers and stood in tub/shower.  Pt has been having frequent falls and neck is painful at this time.  Goals in acute are for min guard to supervision overall. Recommend continued OT at SNF setting post acute.      Follow Up Recommendations  SNF    Equipment Recommendations   (possibly tub bench)    Recommendations for Other Services       Precautions / Restrictions Precautions Precautions: Fall Restrictions Weight Bearing Restrictions: No      Mobility Bed Mobility Overal bed mobility: Needs Assistance Bed Mobility: Sidelying to Sit   Sidelying to sit: Supervision       General bed mobility comments: extra time  Transfers Overall transfer level: Needs assistance Equipment used: Rolling walker (2 wheeled) Transfers: Sit to/from Stand Sit to Stand: Min assist              Balance                                            ADL Overall ADL's : Needs assistance/impaired Eating/Feeding: Set up;Bed level   Grooming: Minimal assistance;Sitting   Upper Body Bathing: Set up;Sitting   Lower Body Bathing: Moderate assistance;Sit to/from stand   Upper Body Dressing : Minimal assistance;Sitting   Lower Body Dressing: Maximal assistance;Sit to/from stand   Toilet Transfer: Minimal assistance;Ambulation           Functional mobility during ADLs: Minimal assistance;Rolling walker General ADL Comments: ambulated 2 feet to sink with RW.  Pt has bil LE lymphedema.  Husband helps with LB adls at baseline. Pt has had many falls recently     Vision                      Perception     Praxis      Pertinent Vitals/Pain Neck is painful from fall; CT -  Pt did not rate pain.  Repositioned.  Orthostatics completed in vital section of chart:  BP 104/55 in lying and sitting, 105/58 in standing.  No c/o dizziness     Hand Dominance     Extremity/Trunk Assessment Upper Extremity Assessment Upper Extremity Assessment: RUE deficits/detail;LUE deficits/detail RUE Deficits / Details: painful in traps at 90; has bil finger tip numbness; difficulty with opening containers LUE Deficits / Details: able to lift to 90; sore neck--did not move past this           Communication Communication Communication: No difficulties   Cognition Arousal/Alertness: Awake/alert Behavior During Therapy: WFL for tasks assessed/performed Overall Cognitive Status: Within Functional Limits for tasks assessed                     General Comments       Exercises       Shoulder Instructions      Home Living Family/patient expects to be discharged to:: Unsure  Additional Comments: uses a walker, has rails around commode.  Stands in tub/shower      Prior Functioning/Environment Level of Independence: Needs assistance        Comments: husband assists with socks/shoes.  She is his caregiver    OT Diagnosis: Generalized weakness   OT Problem List: Decreased strength;Decreased activity tolerance;Impaired balance (sitting and/or standing);Decreased knowledge of use of DME or AE;Pain;Impaired sensation;Increased edema   OT Treatment/Interventions: Self-care/ADL training;Energy conservation;Patient/family education;Therapeutic activities;DME and/or AE instruction;Balance training    OT Goals(Current goals can be found in the care plan section) Acute Rehab OT Goals Patient Stated Goal: less pain; not fall OT Goal Formulation: With patient Time For Goal Achievement: 07/21/13 Potential to  Achieve Goals: Good ADL Goals Pt Will Perform Grooming: with supervision;standing Pt Will Transfer to Toilet: with supervision;ambulating;grab bars;regular height toilet Pt Will Perform Toileting - Clothing Manipulation and hygiene: with supervision;sit to/from stand Additional ADL Goal #1: Pt will maintain static standing for 2 minutes for adls with supervision Additional ADL Goal #2: Pt will retrieve clothes from closet at min guard level  OT Frequency: Min 2X/week   Barriers to D/C:            Co-evaluation              End of Session    Activity Tolerance: Patient limited by fatigue Patient left: in bed;with call bell/phone within reach;with bed alarm set   Time: 0175-1025 OT Time Calculation (min): 21 min Charges:  OT General Charges $OT Visit: 1 Procedure OT Evaluation $Initial OT Evaluation Tier I: 1 Procedure OT Treatments $Self Care/Home Management : 8-22 mins G-Codes:    Lesle Chris Jul 10, 2013, 8:52 AM Lesle Chris, OTR/L [phone] 07/10/13

## 2013-07-07 NOTE — Evaluation (Signed)
Physical Therapy Evaluation Patient Details Name: Victoria Fisher MRN: [id number] DOB: May 20, 1934 Today's Date: 07/07/2013   History of Present Illness  Pt was admitted for FTT/frequent falls.    Clinical Impression  Pt walked 150 feet with rolling walker and min guard assist.  Pt with no dizziness and no loss of balance.  Pt educated on safe turns - she says hse tends to do one full swoop.  Pt with excessive forward head - unable to lift without using her UEs.  Pt therefore leans heavily on arms and they were the first thing to get tired during walk.  Pt was active with [facility] and PT (but she cant remember what company). Pt said she and husband were planning on moving to ALF soon.  She will need more help long term for her and her husband.  Pt may benefit from SNF DC for PT but plans to end up ALF with husband soon.    Follow Up Recommendations Home health PT;SNF (SNF versus ALF discharge )    Equipment Recommendations  None recommended by PT    Recommendations for Other Services       Precautions / Restrictions Precautions Precautions: Fall Restrictions Weight Bearing Restrictions: No      Mobility  Bed Mobility Overal bed mobility: Needs Assistance Bed Mobility: Supine to Sit   Sidelying to sit: Supervision       General bed mobility comments: extra time to get up  Transfers Overall transfer level: Needs assistance Equipment used: Rolling walker (2 wheeled) Transfers: Sit to/from Stand Sit to Stand: Min guard            Ambulation/Gait Ambulation/Gait assistance: Min guard;Min assist Ambulation Distance (Feet): 150 Feet Assistive device: Rolling walker (2 wheeled) Gait Pattern/deviations: Step-through pattern;Shuffle (excessive forward head - making her lean heavily on UEs and arms got tired while walking)        Stairs            Wheelchair Mobility    Modified Rankin (Stroke Patients Only)       Balance Overall balance assessment: Needs  assistance (stands with walker support)                                           Pertinent Vitals/Pain Pain in neck - unable to get positioned comfortablly - she says MD is doing MRI to see what is wrong.    Home Living Family/patient expects to be discharged to:: Unsure                 Additional Comments: uses a walker, has rails around commode.  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  she says they have been looking into assisted living and this is goign to force them t here.  Pt fell 3 times yesterday - she feels she passed out each time but still had to keep getting up - last time was to get husband cup of coffee.  pt with no dizziness today.  pt was active with [facility] and PT -s he is unsure of company but PT was [place].  Pt said she was working on balance and kept trying to get her to hold her head up but it has been getting worse every week.  Pt  unabl eto look up - she has technique to use her right UE to lift her head to look out of the window.  this definitely affects her balance.     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  )      Cervical / Trunk Assessment: Other exceptions (Pt with excessive forward head - chin almost on chest.  she is unable to lift head up - uses her UE to raise head to look forward. s he says it has been getting progressively worse.  she has hard time getting oomvortable - sleeps in hospital bed at home)  Communication    Communication: No difficulties  Cognition Arousal/Alertness: Awake/alert Behavior During Therapy: WFL for tasks assessed/performed Overall Cognitive Status: Within Functional Limits for tasks assessed                      General Comments General comments (skin integrity, edema, etc.): Pt with no dizziess when walking today.  Biggest deficit today was excessive forward haad and balance/leaning on RW due to this    Exercises        Assessment/Plan    PT Assessment Patient needs continued PT services  PT Diagnosis Difficulty walking;Abnormality of gait;Generalized weakness   PT Problem List Decreased strength;Decreased range of motion;Decreased activity tolerance;Decreased mobility;Decreased balance;Decreased safety awareness;Pain  PT Treatment Interventions DME instruction;Gait training;Functional mobility training;Therapeutic activities;Therapeutic exercise;Balance training;Patient/family education   PT Goals (Current goals can be found in the Care Plan section) Acute Rehab PT Goals Patient Stated Goal: to figure out what is wrong with her neck.  to  be able to care for  husband PT Goal Formulation: With patient Time For Goal Achievement: 07/21/13 Potential to Achieve Goals: Good    Frequency Min 3X/week   Barriers to discharge   Pt needs more help at home - looking into ALF versus SNF for her and her husband    Co-evaluation               End of Session Equipment Utilized During Treatment: Gait belt Activity Tolerance: Patient tolerated treatment well Patient left: in chair;with call bell/phone within reach Nurse Communication: Mobility status         Time: 1030-1110 PT Time Calculation (min): 40 min   Charges:   PT Evaluation $Initial PT Evaluation Tier I: 1 Procedure PT Treatments $Gait Training: 23-37 mins   PT G Codes:          Loyal Buba 07/07/2013, 11:27 AM 07/07/2013   Rande Lawman, PT

## 2013-07-07 NOTE — ED Notes (Signed)
Presents from with multiple falls over the last few days, three falls today, hematomas to left side of head. Pt is hypotensive, she is drowsy and c/o neck pain. EKG reads afib. She responds to voice. BP here is 88/55. Husband at bedside with dementia. Pt is caretaker. Bilateral lower extremity edema and redness. HX of CHF

## 2013-07-07 NOTE — ED Provider Notes (Signed)
CSN: [id number]     Arrival date & time 07/07/13  0030 History   First MD Initiated Contact with Patient 07/07/13 0135     Chief Complaint  Patient presents with  . Hypotension  . Fall     (Consider location/radiation/quality/duration/timing/severity/associated sxs/prior Treatment) HPI History per patient. Multiple recent falls at home. Tonight was in the kitchen, fell and had trouble getting up. She did strike her head and has some persistent neck pain from a fall a few days ago. She denies any unilateral weakness or numbness. She does have generalized weakness. No nausea vomiting. No fevers or chills. Recently taken off of Coumadin with history of A. Fib.  Patient is mostly concerned about her husband who she helped cares for at home. Initially very reluctant to be admitted to the hospital, but after further conversation is willing and wanting to be admitted.  She admits to poor sleep and appetite that she relates to having to care for her husband. Symptoms moderate in severity. Past Medical History  Diagnosis Date  . Anxiety   . Arthritis   . COPD (chronic obstructive pulmonary disease)   . Emphysema of lung   . Tuberculosis   . MI (myocardial infarction) 1999    Unstable angina  . Lung nodule   . Diverticulosis of colon   . Depression   . Milroy's disease     Lymphatic disorder diagnosed at [facility]  . Chronic anticoagulation     A fib. On Coumadin.  . IBS (irritable bowel syndrome)   . Edema of both legs     Venous Doppler 05/11/12 was normal & no evidence of thrombus or thrombophlebitis. Chronic LE edema related to both cor pulmonale & lymphedema, hyperlipidemia & HTN involving coronary atherosclerosis by cardiac cath in 2004. Cath showed a tiny old occlusion of the optional diagonal branch & also proximal LAD.  [name] Chronic pain syndrome   . Hyperlipidemia     On a statin. Catheterization 02/12/03 Showed a tiny old occlusion of the optional diagonal branch & also proximal LAD.  [name]  Hypertension     Myoview 07/2011 EF = >55%. RV was moderately dilated. LA was severely dilated. Catheterization 02/12/03 Showed a tiny old occlusion of the optional diagonal branch & also proximal LAD.  [name] Coronary artery disease   . Coronary atherosclerosis     Mild. Myoview 07/2011 EF = >55%. RV was moderately dilated. LA was severely dilated. Catheterization 02/12/03 Showed a tiny old occlusion of the optional diagonal branch & also proximal LAD.  [name] Chronic cor pulmonale     Most likely due to COPD (cannot rule out contribution of diastolic LV dysfunction). ECHO 07/30/11 pulmonary artery pressure was estimated to be 50-60% mmHg & there was moderate tricuspid insufficiency.  . Frequent falls     Possibly due to orthostatic hypotension but we have not seen that on our visits. Recent fall 05/2012 with scalp hematoma, swollen left LE, & black eyes.  . Obstructive sleep apnea     Polysomnogram 09/25/11 AHI: 53.6/hr overall & 57.1/hr during REM sleep. Poor compliance previously with CPAP  . Pulmonary hypertension     Pullmonary hypertension, chronic cor pulmonale w/pulmonary artery pressures of 50&#x2011; 60 mmHg.  . CHF (congestive heart failure)     Acute on chronic, diastolic, improved. Predominantly right heart failure due to cor pulmonale & in turn this is most likely due to untreated OSA.  [name] Dyspnea on exertion     Reluctant to take diuretics  . Atrial  fibrillation, chronic     On Coumadin & a beta-blocker  . Cancer     Carcinoma of sigmoid colon [place].  [name] Bowel obstruction     Caused by scar tissue  . Hypothyroidism 2002    On hormone replacement therapy  . Asthma     On nebulizer  . Insomnia    Past Surgical History  Procedure Laterality Date  . Appendectomy  1956  . Abdominal hysterectomy  1983  . Colon resection      2 times  . Cholecystectomy  1984  . Colon biopsy    . Cardiac catheterization  02/12/03    Showed a tiny old occlusion of the optional diagonal branch &  also proximal LAD.  [name] Colonoscopy  07/23/10   Family History  Problem Relation Age of Onset  . Heart disease Father   . Alzheimer's disease Mother    History  Substance Use Topics  . Smoking status: Never Smoker   . Smokeless tobacco: Never Used  . Alcohol Use: No   OB History   Grav Para Term Preterm Abortions TAB SAB Ect Mult Living                 Review of Systems  Constitutional: Negative for fever and chills.  Respiratory: Negative for shortness of breath.   Cardiovascular: Negative for chest pain.  Gastrointestinal: Negative for abdominal pain.  Genitourinary: Negative for difficulty urinating.  Musculoskeletal: Positive for neck pain. Negative for back pain and neck stiffness.  Skin: Negative for rash.  Neurological: Positive for weakness. Negative for headaches.  All other systems reviewed and are negative.     Allergies  Codeine; Ciprofloxacin; Latex; Penicillins; and Sulfonamide derivatives  Home Medications   Prior to Admission medications   Medication Sig Start Date End Date Taking? Authorizing Provider  aspirin EC 81 MG tablet Take 81 mg by mouth every evening.     Historical Provider, MD  Cholecalciferol (VITAMIN D PO) Take 2 tablets by mouth every morning.    Historical Provider, MD  doxycycline (VIBRA-TABS) 100 MG tablet Take 1 tablet (100 mg total) by mouth 2 (two) times daily. 06/11/13   Nishant Dhungel, MD  hydrocortisone cream 0.5 % Apply 1 application topically 2 (two) times daily as needed (for itchiness).    Historical Provider, MD  levothyroxine (SYNTHROID, LEVOTHROID) 112 MCG tablet Take 1 tablet (112 mcg total) by mouth every morning. 06/30/13   Mihai Croitoru, MD  levothyroxine (SYNTHROID, LEVOTHROID) 112 MCG tablet Take 112 mcg by mouth every morning. 06/30/13   Mihai Croitoru, MD  metoprolol succinate (TOPROL-XL) 50 MG 24 hr tablet Take 50 mg by mouth daily. Take with or immediately following a meal.    Historical Provider, MD  morphine (MS  CONTIN) 60 MG 12 hr tablet Take 60 mg by mouth every 12 (twelve) hours.    Historical Provider, MD  morphine (MSIR) 30 MG tablet Take 30 mg by mouth daily as needed (breakthrough pain morning).     Historical Provider, MD  nitroGLYCERIN (NITROLINGUAL) 0.4 MG/SPRAY spray Place 1 spray under the tongue every 5 (five) minutes as needed for chest pain. 12/13/12   Mihai Croitoru, MD  polyethylene glycol (MIRALAX / GLYCOLAX) packet Take 17 g by mouth daily. 06/11/13   Nishant Dhungel, MD  potassium chloride (K-DUR,KLOR-CON) 10 MEQ tablet Take 20 mEq by mouth 2 (two) times daily.    Historical Provider, MD  Prenatal Vit-Fe Fumarate-FA (PRENATAL VITAMINS) 28-0.8 MG TABS Take 28 mg by  mouth 3 (three) times daily after meals. 06/20/13   [name], MD  senna (SENOKOT) 8.6 MG tablet Take 2-3 tablets by mouth daily.    Historical Provider, MD  torsemide (DEMADEX) 20 MG tablet Take 40 mg by mouth 2 (two) times daily.    Historical Provider, MD  zolpidem (AMBIEN CR) 6.25 MG CR tablet Take 6.25 mg by mouth at bedtime as needed for sleep.    Historical Provider, MD   BP 95/60  Pulse 63  Temp(Src) 97.7 F (36.5 C) (Rectal)  Resp 12  SpO2 100% Physical Exam  Constitutional: She is oriented to person, place, and time. She appears well-developed and well-nourished.  HENT:  Head: Normocephalic.  Small posterior scalp hematoma without laceration or bony deformity  Eyes: EOM are normal. Pupils are equal, round, and reactive to light.  Neck:  Mild midline tenderness and paracervical tenderness, no deformity  Cardiovascular: Normal rate, regular rhythm and intact distal pulses.   Pulmonary/Chest: Effort normal and breath sounds normal. No respiratory distress. She exhibits no tenderness.  Abdominal: Soft. There is no tenderness.  Musculoskeletal: Normal range of motion.  Bilateral lower extremity edema without cords. Equal distal pulses  Neurological: She is alert and oriented to person, place, and time.   Skin: Skin is warm and dry.    ED Course  Procedures (including critical care time) Labs Review Labs Reviewed  CBC - Abnormal; Notable for the following:    RBC 3.29 (*)    Hemoglobin 9.8 (*)    HCT 29.8 (*)    RDW 18.3 (*)    Platelets 107 (*)    All other components within normal limits  COMPREHENSIVE METABOLIC PANEL - Abnormal; Notable for the following:    Potassium 3.0 (*)    BUN 50 (*)    Creatinine, Ser 1.17 (*)    Albumin 3.1 (*)    GFR calc non Af Amer 44 (*)    GFR calc Af Amer 51 (*)    All other components within normal limits  I-STAT CHEM 8, ED - Abnormal; Notable for the following:    Potassium 3.0 (*)    BUN 43 (*)    Creatinine, Ser 1.30 (*)    Hemoglobin 11.2 (*)    HCT 33.0 (*)    All other components within normal limits  PROTIME-INR  URINALYSIS, ROUTINE W REFLEX MICROSCOPIC  I-STAT CG4 LACTIC ACID, ED    Imaging Review Dg Chest 1 View  07/07/2013   CLINICAL DATA:  Hypotension.  History of fall.  EXAM: CHEST - 1 VIEW  COMPARISON:  Chest x-ray 06/08/2013.  FINDINGS: No acute consolidative airspace disease. No pleural effusions. No pneumothorax. No evidence of pulmonary edema. Heart size is mildly enlarged (unchanged). Upper mediastinal contours are unremarkable. Atherosclerosis in the thoracic aorta.  IMPRESSION: 1. No radiographic evidence of acute cardiopulmonary disease. 2. Mild cardiomegaly. 3. Atherosclerosis.   Electronically Signed   By: Vinnie Langton M.D.   On: 07/07/2013 03:03   Ct Head Wo Contrast  07/07/2013   CLINICAL DATA:  Multiple recent falls with injuries to the head.  EXAM: CT HEAD WITHOUT CONTRAST  CT CERVICAL SPINE WITHOUT CONTRAST  TECHNIQUE: Multidetector CT imaging of the head and cervical spine was performed following the standard protocol without intravenous contrast. Multiplanar CT image reconstructions of the cervical spine were also generated.  COMPARISON:  Head CT 05/14/2013. CT of the cervical spine 03/08/2013.  FINDINGS: CT  HEAD FINDINGS  Mild cerebral edema. Patchy and confluent areas of  decreased attenuation are noted throughout the deep and periventricular white matter of the cerebral hemispheres bilaterally, compatible with chronic microvascular ischemic disease. No acute displaced skull fractures are identified. No acute intracranial abnormality. Specifically, no evidence of acute post-traumatic intracranial hemorrhage, no definite regions of acute/subacute cerebral ischemia, no focal mass, mass effect, hydrocephalus or abnormal intra or extra-axial fluid collections. The visualized paranasal sinuses and mastoids are well pneumatized, with exception of some mild multifocal mucosal thickening and a polypoid lesion in the left maxillary sinus, which may represent a maxillary sinus mucosal retention cyst or polyp.  CT CERVICAL SPINE FINDINGS  No acute displaced fractures of the cervical spine. 2 mm of anterolisthesis of C2 upon C3 is chronic and unchanged. Alignment is otherwise anatomic. Prevertebral soft tissues are normal. Severe multilevel degenerative disc disease, most advanced at C5-C6. Mild multilevel facet arthropathy. Visualized portions of the upper thorax are unremarkable.  IMPRESSION: 1. No evidence of significant acute traumatic injury to the skull, brain or cervical spine. 2. Mild cerebral atrophy with chronic microvascular ischemic changes in the cerebral white matter. 3. Multilevel degenerative disc disease and cervical spondylosis again noted. 4. Mild paranasal sinus disease, as above, without acute features.   Electronically Signed   By: Vinnie Langton M.D.   On: 07/07/2013 02:53   Ct Cervical Spine Wo Contrast  07/07/2013   CLINICAL DATA:  Multiple recent falls with injuries to the head.  EXAM: CT HEAD WITHOUT CONTRAST  CT CERVICAL SPINE WITHOUT CONTRAST  TECHNIQUE: Multidetector CT imaging of the head and cervical spine was performed following the standard protocol without intravenous contrast. Multiplanar  CT image reconstructions of the cervical spine were also generated.  COMPARISON:  Head CT 05/14/2013. CT of the cervical spine 03/08/2013.  FINDINGS: CT HEAD FINDINGS  Mild cerebral edema. Patchy and confluent areas of decreased attenuation are noted throughout the deep and periventricular white matter of the cerebral hemispheres bilaterally, compatible with chronic microvascular ischemic disease. No acute displaced skull fractures are identified. No acute intracranial abnormality. Specifically, no evidence of acute post-traumatic intracranial hemorrhage, no definite regions of acute/subacute cerebral ischemia, no focal mass, mass effect, hydrocephalus or abnormal intra or extra-axial fluid collections. The visualized paranasal sinuses and mastoids are well pneumatized, with exception of some mild multifocal mucosal thickening and a polypoid lesion in the left maxillary sinus, which may represent a maxillary sinus mucosal retention cyst or polyp.  CT CERVICAL SPINE FINDINGS  No acute displaced fractures of the cervical spine. 2 mm of anterolisthesis of C2 upon C3 is chronic and unchanged. Alignment is otherwise anatomic. Prevertebral soft tissues are normal. Severe multilevel degenerative disc disease, most advanced at C5-C6. Mild multilevel facet arthropathy. Visualized portions of the upper thorax are unremarkable.  IMPRESSION: 1. No evidence of significant acute traumatic injury to the skull, brain or cervical spine. 2. Mild cerebral atrophy with chronic microvascular ischemic changes in the cerebral white matter. 3. Multilevel degenerative disc disease and cervical spondylosis again noted. 4. Mild paranasal sinus disease, as above, without acute features.   Electronically Signed   By: Vinnie Langton M.D.   On: 07/07/2013 02:53     EKG Interpretation   Date/Time:  Friday Jul 07 2013 00:42:36 EDT Ventricular Rate:  65 PR Interval:    QRS Duration: 116 QT Interval:  462 QTC Calculation: 480 R Axis:    55 Text Interpretation:  Atrial fibrillation Nonspecific intraventricular  conduction delay Low voltage, precordial leads Nonspecific T  abnormalities, anterior leads Confirmed by Dominiq Fontaine  MD,  Blade Scheff ([id number]) on  07/07/2013 3:57:54 AM      IV fluids provided. Potassium for hypokalemia. Discussed with primary care physician, plan admit MDM   Final diagnoses:  Weakness   Generalized weakness with multiple falls at home, has borderline hypertension and heart rate in the 60s. evaluated w EKG, labs and imaging reviewed as above. Discussed with Dr. Osborne Casco, will admit    Teressa Lower, MD 07/07/13 [phone]

## 2013-07-07 NOTE — Progress Notes (Signed)
PCP:    Haywood Pao, MD   Chief Complaint:  Failure to thrive, falls  HPI: Patient is a 79 year old female last admitted to the hospital with fluid overload resulting from her own adjustments to meds including d/c of her diuretics.  She has been home for several weeks and was seen in our office in the past week after she cancelled multiple other attempts at visits.  In fact, her husband cancelled his visit for this week as well.  Regardless, Victoria Fisher had several more falls at home and now comes to the ER stating that she can no longer take care of herself.  After last weeks visit, a written copy of her med list was sent to home health for them to fill her pill boxes as she was unable to remember what meds she took from her last discharge.  Many on her list she stated she was not taking.  Despite this, her med list per the ER is missing several meds on it.  We discussed her memory issues as well at her last OV due to this.  She has seen Dohmeier with Neuro as an outpatient, but mostly for sleep/OSA issues.  Bernese also stated to me that her pharmacy was out of her 60mg  MS contin and that her home nurse, Izora Gala, substituted 2 x 30mg  of her fast release PRN Morphine.  Yarithza asked her about it and was told to just take what she put in her boxes.  I was not aware of this until now and I did not authorize this.  This certainly could have contributed to the past day's events.  Review of Systems:  Review of Systems  Constitutional: Negative for fever and chills.  Respiratory: Negative for shortness of breath.  Cardiovascular: Negative for chest pain.  Gastrointestinal: Negative for abdominal pain.  Genitourinary: Negative for difficulty urinating.  Musculoskeletal: Positive for neck pain. Negative for back pain and neck stiffness.  Skin: Negative for rash.  Neurological: Positive for weakness. Negative for headaches.  All other systems reviewed and are negative.   Past Medical History: Past  Medical History  Diagnosis Date  . Anxiety   . Arthritis   . COPD (chronic obstructive pulmonary disease)   . Emphysema of lung   . Tuberculosis   . MI (myocardial infarction) 1999    Unstable angina  . Lung nodule   . Diverticulosis of colon   . Depression   . Milroy's disease     Lymphatic disorder diagnosed at [facility]  . Chronic anticoagulation     A fib. On Coumadin.  . IBS (irritable bowel syndrome)   . Edema of both legs     Venous Doppler 05/11/12 was normal & no evidence of thrombus or thrombophlebitis. Chronic LE edema related to both cor pulmonale & lymphedema, hyperlipidemia & HTN involving coronary atherosclerosis by cardiac cath in 2004. Cath showed a tiny old occlusion of the optional diagonal branch & also proximal LAD.  [name] Chronic pain syndrome   . Hyperlipidemia     On a statin. Catheterization 02/12/03 Showed a tiny old occlusion of the optional diagonal branch & also proximal LAD.  [name] Hypertension     Myoview 07/2011 EF = >55%. RV was moderately dilated. LA was severely dilated. Catheterization 02/12/03 Showed a tiny old occlusion of the optional diagonal branch & also proximal LAD.  [name] Coronary artery disease   . Coronary atherosclerosis     Mild. Myoview 07/2011 EF = >55%. RV was moderately dilated. LA  was severely dilated. Catheterization 02/12/03 Showed a tiny old occlusion of the optional diagonal branch & also proximal LAD.  [name] Chronic cor pulmonale     Most likely due to COPD (cannot rule out contribution of diastolic LV dysfunction). ECHO 07/30/11 pulmonary artery pressure was estimated to be 50-60% mmHg & there was moderate tricuspid insufficiency.  . Frequent falls     Possibly due to orthostatic hypotension but we have not seen that on our visits. Recent fall 05/2012 with scalp hematoma, swollen left LE, & black eyes.  . Obstructive sleep apnea     Polysomnogram 09/25/11 AHI: 53.6/hr overall & 57.1/hr during REM sleep. Poor compliance previously with CPAP  .  Pulmonary hypertension     Pullmonary hypertension, chronic cor pulmonale w/pulmonary artery pressures of 50&#x2011; 60 mmHg.  . CHF (congestive heart failure)     Acute on chronic, diastolic, improved. Predominantly right heart failure due to cor pulmonale & in turn this is most likely due to untreated OSA.  [name] Dyspnea on exertion     Reluctant to take diuretics  . Atrial fibrillation, chronic     On Coumadin & a beta-blocker  . Cancer     Carcinoma of sigmoid colon [place].  [name] Bowel obstruction     Caused by scar tissue  . Hypothyroidism 2002    On hormone replacement therapy  . Asthma     On nebulizer  . Insomnia    Past Surgical History  Procedure Laterality Date  . Appendectomy  1956  . Abdominal hysterectomy  1983  . Colon resection      2 times  . Cholecystectomy  1984  . Colon biopsy    . Cardiac catheterization  02/12/03    Showed a tiny old occlusion of the optional diagonal branch & also proximal LAD.  [name] Colonoscopy  07/23/10    Medications: Prior to Admission medications   Medication Sig Start Date End Date Taking? Authorizing Provider  aspirin EC 81 MG tablet Take 81 mg by mouth every evening.     Historical Provider, MD  Cholecalciferol (VITAMIN D PO) Take 2 tablets by mouth every morning.    Historical Provider, MD  doxycycline (VIBRA-TABS) 100 MG tablet Take 1 tablet (100 mg total) by mouth 2 (two) times daily. 06/11/13   Nishant Dhungel, MD  hydrocortisone cream 0.5 % Apply 1 application topically 2 (two) times daily as needed (for itchiness).    Historical Provider, MD  levothyroxine (SYNTHROID, LEVOTHROID) 112 MCG tablet Take 1 tablet (112 mcg total) by mouth every morning. 06/30/13   Mihai Croitoru, MD  levothyroxine (SYNTHROID, LEVOTHROID) 112 MCG tablet Take 112 mcg by mouth every morning. 06/30/13   Mihai Croitoru, MD  metoprolol succinate (TOPROL-XL) 50 MG 24 hr tablet Take 50 mg by mouth daily. Take with or immediately following a meal.    Historical  Provider, MD  morphine (MS CONTIN) 60 MG 12 hr tablet Take 60 mg by mouth every 12 (twelve) hours.    Historical Provider, MD  morphine (MSIR) 30 MG tablet Take 30 mg by mouth daily as needed (breakthrough pain morning).     Historical Provider, MD  nitroGLYCERIN (NITROLINGUAL) 0.4 MG/SPRAY spray Place 1 spray under the tongue every 5 (five) minutes as needed for chest pain. 12/13/12   Mihai Croitoru, MD  polyethylene glycol (MIRALAX / GLYCOLAX) packet Take 17 g by mouth daily. 06/11/13   Nishant Dhungel, MD  potassium chloride (K-DUR,KLOR-CON) 10 MEQ tablet Take 20 mEq by mouth  2 (two) times daily.    Historical Provider, MD  Prenatal Vit-Fe Fumarate-FA (PRENATAL VITAMINS) 28-0.8 MG TABS Take 28 mg by mouth 3 (three) times daily after meals. 06/20/13   [name], MD  senna (SENOKOT) 8.6 MG tablet Take 2-3 tablets by mouth daily.    Historical Provider, MD  torsemide (DEMADEX) 20 MG tablet Take 40 mg by mouth 2 (two) times daily.    Historical Provider, MD  zolpidem (AMBIEN CR) 6.25 MG CR tablet Take 6.25 mg by mouth at bedtime as needed for sleep.    Historical Provider, MD    Allergies:   Allergies  Allergen Reactions  . Codeine Anaphylaxis, Hives and Nausea And Vomiting  . Ciprofloxacin Hives and Nausea And Vomiting  . Latex Hives and Rash  . Penicillins Hives and Rash  . Sulfonamide Derivatives Hives and Rash    Social History:  reports that she has never smoked. She has never used smokeless tobacco. She reports that she does not drink alcohol or use illicit drugs.  Family History: Family History  Problem Relation Age of Onset  . Heart disease Father   . Alzheimer's disease Mother     Physical Exam: Filed Vitals:   07/07/13 0115 07/07/13 0145 07/07/13 0200 07/07/13 0303  BP: 83/40 96/51 92/53  95/60  Pulse: 69 64 56 63  Temp:      TempSrc:      Resp: 15 17 17 12   SpO2: 96% 97% 98% 100%   General appearance: alert, cooperative and appears stated age Head:  Normocephalic,small posterior hematoma Eyes: conjunctivae/corneas clear. PERRL, EOM's intact.  Nose: Nares normal. Septum midline. Mucosa normal. No drainage or sinus tenderness. Throat: lips, mucosa, and tongue normal; teeth and gums normal Neck: no adenopathy, no carotid bruit, no JVD and thyroid not enlarged, symmetric, no tenderness/mass/nodules Resp: clear to auscultation bilaterally Cardio: regular rate and rhythm, S1, S2 normal, no murmur, click, rub or gallop GI: soft, non-tender; bowel sounds normal; no masses,  no organomegaly Extremities: smaller, but chronic edema due to Milroy's disease Pulses: 2+ and symmetric Lymph nodes: Cervical adenopathy: no cervical lymphadenopathy Neurologic: Alert and oriented X 3, normal strength and tone. Normal symmetric reflexes.     Labs on Admission:   Recent Labs  07/07/13 0154 07/07/13 0200  NA 139 142  K 3.0* 3.0*  CL 97 96  CO2 29  --   GLUCOSE 93 92  BUN 50* 43*  CREATININE 1.17* 1.30*  CALCIUM 9.6  --     Recent Labs  07/07/13 0154  AST 27  ALT 12  ALKPHOS 89  BILITOT 0.8  PROT 6.5  ALBUMIN 3.1*   No results found for this basename: LIPASE, AMYLASE,  in the last 72 hours  Recent Labs  07/07/13 0154 07/07/13 0200  WBC 4.5  --   HGB 9.8* 11.2*  HCT 29.8* 33.0*  MCV 90.6  --   PLT 107*  --    No results found for this basename: CKTOTAL, CKMB, CKMBINDEX, TROPONINI,  in the last 72 hours Lab Results  Component Value Date   INR 1.15 07/07/2013   INR 1.45 06/08/2013   INR 3.6 05/30/2013   No results found for this basename: TSH, T4TOTAL, FREET3, T3FREE, THYROIDAB,  in the last 72 hours No results found for this basename: VITAMINB12, FOLATE, FERRITIN, TIBC, IRON, RETICCTPCT,  in the last 72 hours  Radiological Exams on Admission: Dg Chest 1 View  07/07/2013   CLINICAL DATA:  Hypotension.  History of fall.  EXAM: CHEST - 1 VIEW  COMPARISON:  Chest x-ray 06/08/2013.  FINDINGS: No acute consolidative airspace  disease. No pleural effusions. No pneumothorax. No evidence of pulmonary edema. Heart size is mildly enlarged (unchanged). Upper mediastinal contours are unremarkable. Atherosclerosis in the thoracic aorta.  IMPRESSION: 1. No radiographic evidence of acute cardiopulmonary disease. 2. Mild cardiomegaly. 3. Atherosclerosis.   Electronically Signed   By: Vinnie Langton M.D.   On: 07/07/2013 03:03   Dg Abd 1 View  06/08/2013   CLINICAL DATA:  Abdominal swelling and constipation.  EXAM: ABDOMEN - 1 VIEW  COMPARISON:  CT ABD W/CM dated 02/02/2006; DG LUMBAR SPINE COMPLETE dated 08/22/2004  FINDINGS: Gas and stool in the colon. No small or large bowel distention. Postoperative changes in the pelvis. Degenerative changes in the lumbar spine with mild scoliosis convex towards the right. No radiopaque stones.  IMPRESSION: Nonobstructive bowel gas pattern.   Electronically Signed   By: Lucienne Capers M.D.   On: 06/08/2013 02:52   Ct Head Wo Contrast  07/07/2013   CLINICAL DATA:  Multiple recent falls with injuries to the head.  EXAM: CT HEAD WITHOUT CONTRAST  CT CERVICAL SPINE WITHOUT CONTRAST  TECHNIQUE: Multidetector CT imaging of the head and cervical spine was performed following the standard protocol without intravenous contrast. Multiplanar CT image reconstructions of the cervical spine were also generated.  COMPARISON:  Head CT 05/14/2013. CT of the cervical spine 03/08/2013.  FINDINGS: CT HEAD FINDINGS  Mild cerebral edema. Patchy and confluent areas of decreased attenuation are noted throughout the deep and periventricular white matter of the cerebral hemispheres bilaterally, compatible with chronic microvascular ischemic disease. No acute displaced skull fractures are identified. No acute intracranial abnormality. Specifically, no evidence of acute post-traumatic intracranial hemorrhage, no definite regions of acute/subacute cerebral ischemia, no focal mass, mass effect, hydrocephalus or abnormal intra or  extra-axial fluid collections. The visualized paranasal sinuses and mastoids are well pneumatized, with exception of some mild multifocal mucosal thickening and a polypoid lesion in the left maxillary sinus, which may represent a maxillary sinus mucosal retention cyst or polyp.  CT CERVICAL SPINE FINDINGS  No acute displaced fractures of the cervical spine. 2 mm of anterolisthesis of C2 upon C3 is chronic and unchanged. Alignment is otherwise anatomic. Prevertebral soft tissues are normal. Severe multilevel degenerative disc disease, most advanced at C5-C6. Mild multilevel facet arthropathy. Visualized portions of the upper thorax are unremarkable.  IMPRESSION: 1. No evidence of significant acute traumatic injury to the skull, brain or cervical spine. 2. Mild cerebral atrophy with chronic microvascular ischemic changes in the cerebral white matter. 3. Multilevel degenerative disc disease and cervical spondylosis again noted. 4. Mild paranasal sinus disease, as above, without acute features.   Electronically Signed   By: Vinnie Langton M.D.   On: 07/07/2013 02:53   Ct Cervical Spine Wo Contrast  07/07/2013   CLINICAL DATA:  Multiple recent falls with injuries to the head.  EXAM: CT HEAD WITHOUT CONTRAST  CT CERVICAL SPINE WITHOUT CONTRAST  TECHNIQUE: Multidetector CT imaging of the head and cervical spine was performed following the standard protocol without intravenous contrast. Multiplanar CT image reconstructions of the cervical spine were also generated.  COMPARISON:  Head CT 05/14/2013. CT of the cervical spine 03/08/2013.  FINDINGS: CT HEAD FINDINGS  Mild cerebral edema. Patchy and confluent areas of decreased attenuation are noted throughout the deep and periventricular white matter of the cerebral hemispheres bilaterally, compatible with chronic microvascular ischemic disease. No acute displaced skull fractures  are identified. No acute intracranial abnormality. Specifically, no evidence of acute  post-traumatic intracranial hemorrhage, no definite regions of acute/subacute cerebral ischemia, no focal mass, mass effect, hydrocephalus or abnormal intra or extra-axial fluid collections. The visualized paranasal sinuses and mastoids are well pneumatized, with exception of some mild multifocal mucosal thickening and a polypoid lesion in the left maxillary sinus, which may represent a maxillary sinus mucosal retention cyst or polyp.  CT CERVICAL SPINE FINDINGS  No acute displaced fractures of the cervical spine. 2 mm of anterolisthesis of C2 upon C3 is chronic and unchanged. Alignment is otherwise anatomic. Prevertebral soft tissues are normal. Severe multilevel degenerative disc disease, most advanced at C5-C6. Mild multilevel facet arthropathy. Visualized portions of the upper thorax are unremarkable.  IMPRESSION: 1. No evidence of significant acute traumatic injury to the skull, brain or cervical spine. 2. Mild cerebral atrophy with chronic microvascular ischemic changes in the cerebral white matter. 3. Multilevel degenerative disc disease and cervical spondylosis again noted. 4. Mild paranasal sinus disease, as above, without acute features.   Electronically Signed   By: Vinnie Langton M.D.   On: 07/07/2013 02:53   Mr Brain Wo Contrast  06/10/2013   CLINICAL DATA:  Falling.  Abnormal reflexes.  EXAM: MRI HEAD WITHOUT CONTRAST  TECHNIQUE: Multiplanar, multiecho pulse sequences of the brain and surrounding structures were obtained without intravenous contrast.  COMPARISON:  Head CT 05/14/2013.  MRI 03/28/2008.  FINDINGS: The study suffers from moderate motion degradation. Diffusion imaging does not show any acute or subacute infarction. The brainstem and cerebellum are normal. The cerebral hemispheres are normal except for minimal chronic small vessel changes within the deep white matter. No cortical or large vessel territory infarction. No mass lesion, hemorrhage, hydrocephalus or extra-axial collection.  No pituitary mass. No inflammatory sinus disease. No skull or skullbase lesion.  IMPRESSION: Motion degraded exam. No acute finding. Mild age related volume loss and small vessel change of the deep white matter, less than often seen in healthy individuals of this age.   Electronically Signed   By: Nelson Chimes M.D.   On: 06/10/2013 13:10   Mr Cervical Spine Wo Contrast  06/10/2013   CLINICAL DATA:  Frequent falls.  Abnormal reflexes.  EXAM: MRI CERVICAL SPINE WITHOUT CONTRAST  TECHNIQUE: Multiplanar, multisequence MR imaging was performed. No intravenous contrast was administered.  COMPARISON:  CT 03/08/2013.  FINDINGS: The foramen magnum is widely patent. There is ordinary osteoarthritis of the C1-2 level but no stenosis.  C2-3:  Mild facet degeneration.  No canal or foraminal stenosis.  C3-4: Spondylosis with endplate osteophytes and bulging of the disc. No compressive narrowing of the central canal. Mild foraminal narrowing on the left. Facet degeneration on the left which could be a cause of pain.  C4-5: Spondylosis without compressive narrowing of the canal or foramina.  C5-6: Chronic degenerative spondylosis with disc space narrowing. Chronic marrow changes at C5 and C6. No central canal stenosis. Mild foraminal narrowing bilaterally because of osteophytic encroachment.  C6-7:  Mild bulging of the disc.  No canal or foraminal stenosis.  C7-T1:  Mild bulging of the disc.  No canal or foraminal stenosis.  No cord lesion.  IMPRESSION: No abnormality seen to explain falling and abnormal reflexes. Ordinary mid cervical spondylosis without canal stenosis, cord compression or cord lesion. Degenerative spondylosis at the C5-6 level is chronic, and associated with sclerosis and marrow edema. This could certainly relate to neck pain, but there is no compromise of the spinal canal proper. There  is some foraminal narrowing bilaterally because of osteophytic encroachment.   Electronically Signed   By: Nelson Chimes M.D.    On: 06/10/2013 12:17   Dg Chest Port 1 View  06/08/2013   CLINICAL DATA:  Lower extremity edema. Patient has been unable to urinate for 3 days. No bowel movement for 3 weeks.  EXAM: PORTABLE CHEST - 1 VIEW  COMPARISON:  DG THORACIC SPINE W/SWIMMERS dated 03/08/2013; DG CHEST 2 VIEW dated 03/12/2008  FINDINGS: Shallow inspiration. Cardiac enlargement without significant pulmonary vascular congestion. No focal airspace disease or consolidation. No blunting of costophrenic angles. No pneumothorax. No significant change since previous study.  IMPRESSION: Cardiac enlargement.  No evidence of edema or active disease.   Electronically Signed   By: Lucienne Capers M.D.   On: 06/08/2013 01:54   Dg Hand Complete Left  06/08/2013   CLINICAL DATA:  Redness and swelling in both hands. No known injury.  EXAM: LEFT HAND - COMPLETE 3+ VIEW  COMPARISON:  None.  FINDINGS: Degenerative changes in the radiocarpal, STT, first carpometacarpal, first metacarpal phalangeal, and multiple interphalangeal joints. Subcortical cyst demonstrated in the scaphoid bone and in the second proximal phalanx. No evidence of acute fracture or dislocation. No focal bone erosions. Mild dorsal soft tissue swelling.  IMPRESSION: Degenerative changes in the left hand. Soft tissue swelling. No acute bony abnormalities.   Electronically Signed   By: Lucienne Capers M.D.   On: 06/08/2013 02:51   Dg Hand Complete Right  06/08/2013   CLINICAL DATA:  Redness and swelling in both hands. No known injury.  EXAM: RIGHT HAND - COMPLETE 3+ VIEW  COMPARISON:  None.  FINDINGS: Degenerative changes in the radiocarpal, STT, first carpometacarpal, first metacarpophalangeal, and multiple interphalangeal joints. Dorsal soft tissue swelling. No focal bone erosion. No evidence of acute fracture or dislocation.  IMPRESSION: Degenerative changes in the right hand. Soft tissue swelling. No acute bony abnormalities.   Electronically Signed   By: Lucienne Capers M.D.   On:  06/08/2013 02:52   Orders placed during the hospital encounter of 07/07/13  . ED EKG  . ED EKG  . EKG 12-LEAD  . EKG 12-LEAD    Assessment/Plan Active Problems:   Failure to thrive Trauma panel negative for fracture.  Breea clearly cannot care for herself at home and both her and her husband will require placement with assisted living being the minimum level of care.  We will need family to be involved as is often the case, Jood does not take suggestions very well, and despite stating that she knows they need to go somewhere, I expect that she will change her mind as she often does.  They are clearly not safe at home even with multiple levels of home health coming in.  In fact, her med list as verified in the ER is missing several of her meds, which I know her office med list is correct as it was sent to home health earlier in the week as verification of her home meds to fill her pill boxes.  Will get social work, PT/OT all to see her.   Milroy's disease:  Chronic edema, considerable diuresis at her last hospitalization, maintain current diuretics.  Will need to consider reducing her Morphine as well as may contribute to her issues.  She has been on the same dose prior to joining my practice several years ago due to the Milroy's Hypokalemia: Put on higher daily replacement HTN: Stable.  Her ACE was added to her  list as it was absent Hyperlipidemia: Resume Lipitor as also missing from her list along with MagOx. Hypothyroid:  For some reason, she was on a much lower dose than her normal replacement following her last discharge with office TSH elevated, She was restarted on the appropriate 167mcg recently so her TSH is still likely going to be elevated and not tested.  Will need retesting as scheduled in about 3 more weeks.  Cor Pulmonale: Stable CAD: Resumed statin, has OV pending for next week with Croitaru as well. Afib: Rate controlled.  Has been taken off Coumadin due to falls prior.  On  ASA only  Pain: WIll use the proper Sustained release Morphine as her q12 hour med.  See HPI as it was noted that her home nurse substituted regular release without my knowledge or consent.  I will refer this to their home health agency as an inquiry.  Fransico Him Tisovec 07/07/2013, 7:38 AM

## 2013-07-07 NOTE — ED Notes (Signed)
MD at bedside. DR. Marnette Burgess made aware of low bp. Pt. Asymptomatic.

## 2013-07-07 NOTE — Progress Notes (Signed)
Noted Dr. Loren Racer note regarding patient's medication list. Reviewed her chart and outpatient appointments. The following is an excerpt from her visit with Dr. Avon Gully on 05/30/2013:  "I am very concerned about based frequent falls and thinks she is at risk for serious injury while on warfarin. I recommended that she stop the warfarin at least temporarily. She expressed her concern that this may be complicated by a stroke. I told her that at this point I think the risk of a serious injury with epidural or subdural hematoma at least matches the risk of an embolic event. We will restart chronic anticoagulation if we can stop her falls.  Although her falls do not appear to be directly related to hypotension, her blood pressure is low and I've recommended that she discontinue lisinopril and isosorbide. I think she has clear evidence of hypervolemia and I would like to give her more diuretics, but will delay this until her blood pressure improves and we clarified the reason for her falls."  Through discussion with nurses and other notes, determined patient would likely not be able to give an accurate medication list herself as it seems there is a caretaker who fills her medication boxes for her. I personally called the [facility] on [address] to determine what she has filled recently. The only medications she has had filled in the past 30 days are Synthroid 166mcg qAM and [place] Con 75mEq daily.  She has not filled her prescriptions for lisinopril 5mg  daily or morphine ER 60mg  daily or morphine IR 30mg  TID prn since March of this year from that pharmacy. It is possible she had 90 day supplies of these and she still has some available. The only other medications they have on file as her filling since March are creams and a nasal spray.  I also called [facility] as this was also listed as a pharmacy Mrs. Lavery uses. The following medications have been filled in the past 90 days- all  were 90 day supplies meaning she should still have these medications at home. -Torsemide 20mg  BID -atorvastatin 40mg  daily -isosorbide dinitrate 10mg  BID -Toprol XL 50mg  daily -lisinopril 20mg  daily -warfarin 2mg  use as directed -Synthroid 136mcg daily -sertraline 100mg  daily -SL nitro spray use as directed  I have updated the medication history list with all of this information. I am unable to determine last doses for these medications.  It seems that there is disconnect between information from providers, pharmacies and what patient has told staff here about her medications. She likely would benefit from a complete evaluation of her medication list and very clear instructions regarding what she should or should not be taking.  Jamelia Varano D. Joleen Stuckert, PharmD, BCPS Clinical Pharmacist 07/07/2013 3:09 PM

## 2013-07-07 NOTE — ED Notes (Signed)
Pt. States, "cannot find time to eat or drink..just doing to much; cracker or water will do."  pcp increased lasix dose x 2 months. Ago.

## 2013-07-08 DIAGNOSIS — E43 Unspecified severe protein-calorie malnutrition: Secondary | ICD-10-CM | POA: Insufficient documentation

## 2013-07-08 LAB — CBC
HEMATOCRIT: 31.2 % — AB (ref 36.0–46.0)
HEMOGLOBIN: 10 g/dL — AB (ref 12.0–15.0)
MCH: 29.6 pg (ref 26.0–34.0)
MCHC: 32.1 g/dL (ref 30.0–36.0)
MCV: 92.3 fL (ref 78.0–100.0)
Platelets: 104 10*3/uL — ABNORMAL LOW (ref 150–400)
RBC: 3.38 MIL/uL — ABNORMAL LOW (ref 3.87–5.11)
RDW: 18.7 % — AB (ref 11.5–15.5)
WBC: 3.9 10*3/uL — AB (ref 4.0–10.5)

## 2013-07-08 LAB — CK: CK TOTAL: 70 U/L (ref 7–177)

## 2013-07-08 LAB — BASIC METABOLIC PANEL WITH GFR
BUN: 44 mg/dL — ABNORMAL HIGH (ref 6–23)
CO2: 32 meq/L (ref 19–32)
Calcium: 10.3 mg/dL (ref 8.4–10.5)
Chloride: 100 meq/L (ref 96–112)
Creatinine, Ser: 1.01 mg/dL (ref 0.50–1.10)
GFR calc Af Amer: 61 mL/min — ABNORMAL LOW
GFR calc non Af Amer: 52 mL/min — ABNORMAL LOW
Glucose, Bld: 120 mg/dL — ABNORMAL HIGH (ref 70–99)
Potassium: 4.3 meq/L (ref 3.7–5.3)
Sodium: 142 meq/L (ref 137–147)

## 2013-07-08 LAB — SEDIMENTATION RATE: Sed Rate: 37 mm/h — ABNORMAL HIGH (ref 0–22)

## 2013-07-08 NOTE — Progress Notes (Signed)
Subjective: Reviewed history with patient- she reports great confusion over which medications she was to take as Dr. Sallyanne Kuster recently stopped her Lisinopril, Imdur and Atorvastatin due to concern over relative hypotension contributing to falls and limited ability to escalate diuretic dose.  She feels better this am.  Acknowledges that extra Morphine may have caused acute change but she has had a chronic issue with recurrent falls and gait instability that preceded this change.  Does not want to go to SNF as she feels she needs to care for husband with dementia.    Objective: Vital signs in last 24 hours: Temp:  [97.7 F (36.5 C)-98.3 F (36.8 C)] 98.3 F (36.8 C) (05/23 0500) Pulse Rate:  [63-87] 63 (05/23 0500) Resp:  [18] 18 (05/23 0500) BP: (98-120)/(54-67) 98/67 mmHg (05/23 0500) SpO2:  [95 %-98 %] 95 % (05/23 0500) Weight:  [69.8 kg (153 lb 14.1 oz)] 69.8 kg (153 lb 14.1 oz) (05/22 2113) Weight change: 1.3 kg (2 lb 13.9 oz)    CBG (last 3)  No results found for this basename: GLUCAP,  in the last 72 hours  Intake/Output from previous day: 05/22 0701 - 05/23 0700 In: 920 [P.O.:720; I.V.:200] Out: 1401 [Urine:1400; Stool:1] Intake/Output this shift:    General appearance: alert and frail, chronically ill Eyes: no scleral icterus Throat: oropharynx moist without erythema Resp: clear to auscultation bilaterally Cardio: regular rate and rhythm GI: soft, non-tender; bowel sounds normal; no masses,  no organomegaly Extremities:chronic edema changes with thickening of skin in lower extremities; 1+ edema  Lab Results:  Recent Labs  07/07/13 0154 07/07/13 0200  NA 139 142  K 3.0* 3.0*  CL 97 96  CO2 29  --   GLUCOSE 93 92  BUN 50* 43*  CREATININE 1.17* 1.30*  CALCIUM 9.6  --     Recent Labs  07/07/13 0154  AST 27  ALT 12  ALKPHOS 89  BILITOT 0.8  PROT 6.5  ALBUMIN 3.1*    Recent Labs  07/07/13 0154 07/07/13 0200 07/08/13 0424  WBC 4.5  --  3.9*  HGB  9.8* 11.2* 10.0*  HCT 29.8* 33.0* 31.2*  MCV 90.6  --  92.3  PLT 107*  --  104*   Lab Results  Component Value Date   INR 1.15 07/07/2013   INR 1.45 06/08/2013   INR 3.6 05/30/2013   No results found for this basename: CKTOTAL, CKMB, CKMBINDEX, TROPONINI,  in the last 72 hours No results found for this basename: TSH, T4TOTAL, FREET3, T3FREE, THYROIDAB,  in the last 72 hours No results found for this basename: VITAMINB12, FOLATE, FERRITIN, TIBC, IRON, RETICCTPCT,  in the last 72 hours  Studies/Results: Dg Chest 1 View  07/07/2013   CLINICAL DATA:  Hypotension.  History of fall.  EXAM: CHEST - 1 VIEW  COMPARISON:  Chest x-ray 06/08/2013.  FINDINGS: No acute consolidative airspace disease. No pleural effusions. No pneumothorax. No evidence of pulmonary edema. Heart size is mildly enlarged (unchanged). Upper mediastinal contours are unremarkable. Atherosclerosis in the thoracic aorta.  IMPRESSION: 1. No radiographic evidence of acute cardiopulmonary disease. 2. Mild cardiomegaly. 3. Atherosclerosis.   Electronically Signed   By: Vinnie Langton M.D.   On: 07/07/2013 03:03   Ct Head Wo Contrast  07/07/2013   CLINICAL DATA:  Multiple recent falls with injuries to the head.  EXAM: CT HEAD WITHOUT CONTRAST  CT CERVICAL SPINE WITHOUT CONTRAST  TECHNIQUE: Multidetector CT imaging of the head and cervical spine was performed following the standard  protocol without intravenous contrast. Multiplanar CT image reconstructions of the cervical spine were also generated.  COMPARISON:  Head CT 05/14/2013. CT of the cervical spine 03/08/2013.  FINDINGS: CT HEAD FINDINGS  Mild cerebral edema. Patchy and confluent areas of decreased attenuation are noted throughout the deep and periventricular white matter of the cerebral hemispheres bilaterally, compatible with chronic microvascular ischemic disease. No acute displaced skull fractures are identified. No acute intracranial abnormality. Specifically, no evidence of acute  post-traumatic intracranial hemorrhage, no definite regions of acute/subacute cerebral ischemia, no focal mass, mass effect, hydrocephalus or abnormal intra or extra-axial fluid collections. The visualized paranasal sinuses and mastoids are well pneumatized, with exception of some mild multifocal mucosal thickening and a polypoid lesion in the left maxillary sinus, which may represent a maxillary sinus mucosal retention cyst or polyp.  CT CERVICAL SPINE FINDINGS  No acute displaced fractures of the cervical spine. 2 mm of anterolisthesis of C2 upon C3 is chronic and unchanged. Alignment is otherwise anatomic. Prevertebral soft tissues are normal. Severe multilevel degenerative disc disease, most advanced at C5-C6. Mild multilevel facet arthropathy. Visualized portions of the upper thorax are unremarkable.  IMPRESSION: 1. No evidence of significant acute traumatic injury to the skull, brain or cervical spine. 2. Mild cerebral atrophy with chronic microvascular ischemic changes in the cerebral white matter. 3. Multilevel degenerative disc disease and cervical spondylosis again noted. 4. Mild paranasal sinus disease, as above, without acute features.   Electronically Signed   By: Vinnie Langton M.D.   On: 07/07/2013 02:53   Ct Cervical Spine Wo Contrast  07/07/2013   CLINICAL DATA:  Multiple recent falls with injuries to the head.  EXAM: CT HEAD WITHOUT CONTRAST  CT CERVICAL SPINE WITHOUT CONTRAST  TECHNIQUE: Multidetector CT imaging of the head and cervical spine was performed following the standard protocol without intravenous contrast. Multiplanar CT image reconstructions of the cervical spine were also generated.  COMPARISON:  Head CT 05/14/2013. CT of the cervical spine 03/08/2013.  FINDINGS: CT HEAD FINDINGS  Mild cerebral edema. Patchy and confluent areas of decreased attenuation are noted throughout the deep and periventricular white matter of the cerebral hemispheres bilaterally, compatible with chronic  microvascular ischemic disease. No acute displaced skull fractures are identified. No acute intracranial abnormality. Specifically, no evidence of acute post-traumatic intracranial hemorrhage, no definite regions of acute/subacute cerebral ischemia, no focal mass, mass effect, hydrocephalus or abnormal intra or extra-axial fluid collections. The visualized paranasal sinuses and mastoids are well pneumatized, with exception of some mild multifocal mucosal thickening and a polypoid lesion in the left maxillary sinus, which may represent a maxillary sinus mucosal retention cyst or polyp.  CT CERVICAL SPINE FINDINGS  No acute displaced fractures of the cervical spine. 2 mm of anterolisthesis of C2 upon C3 is chronic and unchanged. Alignment is otherwise anatomic. Prevertebral soft tissues are normal. Severe multilevel degenerative disc disease, most advanced at C5-C6. Mild multilevel facet arthropathy. Visualized portions of the upper thorax are unremarkable.  IMPRESSION: 1. No evidence of significant acute traumatic injury to the skull, brain or cervical spine. 2. Mild cerebral atrophy with chronic microvascular ischemic changes in the cerebral white matter. 3. Multilevel degenerative disc disease and cervical spondylosis again noted. 4. Mild paranasal sinus disease, as above, without acute features.   Electronically Signed   By: Vinnie Langton M.D.   On: 07/07/2013 02:53     Medications: Scheduled: . aspirin EC  81 mg Oral QPM  . atorvastatin  20 mg Oral q1800  .  cholecalciferol  1,000 Units Oral Daily  . feeding supplement (ENSURE COMPLETE)  237 mL Oral BID BM  . levothyroxine  112 mcg Oral q morning - 10a  . lisinopril  20 mg Oral Daily  . magnesium oxide  400 mg Oral Daily  . metoprolol succinate  50 mg Oral Daily  . morphine  60 mg Oral Q12H  . polyethylene glycol  17 g Oral Daily  . potassium chloride  20 mEq Oral BID  . senna  2 tablet Oral Daily  . torsemide  40 mg Oral BID   Continuous: .  0.9 % NaCl with KCl 20 mEq / L 50 mL/hr at 07/08/13 9914    Assessment/Plan: 1. Failure to thrive- multifactorial due to polypharmacy, unsteady gait, chronic Milroy's, OSA.  She has seen Neurology who does not see a reversible neurologic cause.  Check CK and ESR. Continue PT/OT. Orthostatics negative. 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  Saline lock IV. 3. Hypokalemia- recheck with increased KCL dosage.  May be contributing to weakness. 4. HTN- hold Lisinopril per Dr. Victorino December recommendation due to relative hypotension contributing to falls. 5. Hyperlipidemia- check CK.  Dr. Sallyanne Kuster had discontinued Lipitor due to ?muscle effects. 6. Hypothyroid- synthroid increased to prior dose. 7. Cor Pulmonale: Stable  8. CAD- continue ASA, Bblocker.  Hold statin per Dr. Sallyanne Kuster. 9. Afib- Rate controlled. Has been taken off Coumadin due to fall risk. On ASA only  10. Disposition- anticipate discharge to home in 1-2 days with home health PT/OT, RN, and [facility] support.  Declines SNF rehab which would be preferable.   LOS: 1 day   Marton Redwood 07/08/2013, 9:25 AM

## 2013-07-09 LAB — BASIC METABOLIC PANEL
BUN: 45 mg/dL — AB (ref 6–23)
CO2: 33 meq/L — AB (ref 19–32)
CREATININE: 1.06 mg/dL (ref 0.50–1.10)
Calcium: 10.5 mg/dL (ref 8.4–10.5)
Chloride: 96 mEq/L (ref 96–112)
GFR calc Af Amer: 57 mL/min — ABNORMAL LOW (ref >90)
GFR calc non Af Amer: 49 mL/min — ABNORMAL LOW (ref >90)
Glucose, Bld: 91 mg/dL (ref 70–99)
Potassium: 4 mEq/L (ref 3.7–5.3)
Sodium: 139 mEq/L (ref 137–147)

## 2013-07-09 LAB — CBC
HEMATOCRIT: 35.3 % — AB (ref 36.0–46.0)
HEMOGLOBIN: 11.2 g/dL — AB (ref 12.0–15.0)
MCH: 29.6 pg (ref 26.0–34.0)
MCHC: 31.7 g/dL (ref 30.0–36.0)
MCV: 93.4 fL (ref 78.0–100.0)
Platelets: 131 10*3/uL — ABNORMAL LOW (ref 150–400)
RBC: 3.78 MIL/uL — ABNORMAL LOW (ref 3.87–5.11)
RDW: 18.5 % — ABNORMAL HIGH (ref 11.5–15.5)
WBC: 4.6 10*3/uL (ref 4.0–10.5)

## 2013-07-09 LAB — CORTISOL: Cortisol, Plasma: 16.3 ug/dL

## 2013-07-09 MED ORDER — METOPROLOL SUCCINATE ER 25 MG PO TB24
25.0000 mg | ORAL_TABLET | Freq: Every day | ORAL | Status: DC
  Administered 2013-07-09 – 2013-07-10 (×2): 25 mg via ORAL
  Filled 2013-07-09 (×3): qty 1

## 2013-07-09 NOTE — Clinical Social Work Psychosocial (Signed)
Clinical Social Work Department BRIEF PSYCHOSOCIAL ASSESSMENT 07/09/2013  Patient:  Victoria Fisher, Victoria Fisher     Account Number:  [id number]     Admit date:  07/07/2013  Clinical Social Worker:  Hubert Azure  Date/Time:  07/09/2013 02:19 PM  Referred by:  Physician  Date Referred:  07/09/2013 Referred for  SNF Placement   Other Referral:   Interview type:  Patient Other interview type:    PSYCHOSOCIAL DATA Living Status:  HUSBAND Admitted from facility:   Level of care:   Primary support name:  Ernestine Conrad, Scottie Primary support relationship to patient:  FRIEND Degree of support available:   Good. Patient stated her friends are available any time she needs them.    CURRENT CONCERNS Current Concerns  Post-Acute Placement   Other Concerns:    SOCIAL WORK ASSESSMENT / PLAN CSW met with patient who was alert and oriented x4. CSW introduced self and explained role. CSW discussed d/c plan with patient. Per patient, she is the primary caregiver for her husband who has Alzheimer. Patient stated she can't leave her husband home while she receives therapy at a facility, although she has a friend currently staying with him. Per patient, patient requires 24 hour supervision, but no one is able to stay with him and provide the supervision he requires. Patient expressed concern about the time her husband is home alone. Per patient, she was receiving PT through a [facility] agency prior to hospitalization, but can't recall the agency. Patient stated she has been receiving PT for 4 weeks and has an RN come twice a week. Patient stated [facility] agency has agreed to offer her husband PT as well. Patient stated she is agreeable to continuing PT at home. Patient also declined SNF.   Assessment/plan status:  Other - See comment Other assessment/ plan:   RNCM made aware of [place] PT recommendation by PT.   Information/referral to community resources:    PATIENT'S/FAMILY'S RESPONSE TO PLAN OF  CARE: Patient thanked CSW for assistance with d/c plan and providing appropriate support.   [place], [place] Weekend Clinical Social Worker [phone]

## 2013-07-09 NOTE — Progress Notes (Signed)
Subjective: Feels great this am but concerned about drop in BP (78/48).  Reports that she felt a little lightheaded with that.  Ambulating with walker without lightheadedness.   Objective: Vital signs in last 24 hours: Temp:  [98.1 F (36.7 C)-98.7 F (37.1 C)] 98.1 F (36.7 C) (05/24 0621) Pulse Rate:  [68-78] 78 (05/24 0627) Resp:  [17-18] 17 (05/24 0621) BP: (78-93)/(48-52) 90/51 mmHg (05/24 0627) SpO2:  [95 %-100 %] 95 % (05/24 0621) Weight:  [67.586 kg (149 lb)] 67.586 kg (149 lb) (05/23 2155) Weight change: -2.214 kg (-4 lb 14.1 oz)    CBG (last 3)  No results found for this basename: GLUCAP,  in the last 72 hours  Intake/Output from previous day: 05/23 0701 - 05/24 0700 In: 720 [P.O.:480] Out: 1200 [Urine:1200] Intake/Output this shift:    General appearance: alert and no distress Eyes: no scleral icterus Throat: oropharynx moist without erythema Resp: clear to auscultation bilaterally Cardio: distant heart sounds, irregulalrly irregular GI: soft, non-tender; bowel sounds normal; no masses,  no organomegaly Extremities: no clubbing, cyanosis; chronic edema changes, thickened skin  Lab Results:  Recent Labs  07/08/13 0950 07/09/13 0616  NA 142 139  K 4.3 4.0  CL 100 96  CO2 32 33*  GLUCOSE 120* 91  BUN 44* 45*  CREATININE 1.01 1.06  CALCIUM 10.3 10.5    Recent Labs  07/07/13 0154  AST 27  ALT 12  ALKPHOS 89  BILITOT 0.8  PROT 6.5  ALBUMIN 3.1*    Recent Labs  07/08/13 0424 07/09/13 0616  WBC 3.9* 4.6  HGB 10.0* 11.2*  HCT 31.2* 35.3*  MCV 92.3 93.4  PLT 104* 131*   Lab Results  Component Value Date   INR 1.15 07/07/2013   INR 1.45 06/08/2013   INR 3.6 05/30/2013    Recent Labs  07/08/13 0950  CKTOTAL 70   No results found for this basename: TSH, T4TOTAL, FREET3, T3FREE, THYROIDAB,  in the last 72 hours No results found for this basename: VITAMINB12, FOLATE, FERRITIN, TIBC, IRON, RETICCTPCT,  in the last 72  hours  Studies/Results: No results found.   Medications: Scheduled: . aspirin EC  81 mg Oral QPM  . cholecalciferol  1,000 Units Oral Daily  . feeding supplement (ENSURE COMPLETE)  237 mL Oral BID BM  . levothyroxine  112 mcg Oral q morning - 10a  . magnesium oxide  400 mg Oral Daily  . metoprolol succinate  25 mg Oral Daily  . morphine  60 mg Oral Q12H  . polyethylene glycol  17 g Oral Daily  . potassium chloride  20 mEq Oral BID  . senna  2 tablet Oral Daily  . torsemide  40 mg Oral BID   Continuous:   Assessment/Plan: 1. Failure to thrive/Recurrent Falls- multifactorial due to polypharmacy, unsteady gait, chronic Milroy's, OSA, hypotension. She has seen Neurology who does not see a reversible neurologic cause. Check CK and ESR. Continue PT/OT. Orthostatics negative.  2. Milroy's disease/chronic pain- continue diuretics and chronic pain regimen. Saline lock IV.  3. Hypokalemia- improved with increased KCL dosage. May be contributing to weakness.  4. HTN with hypotension- Lisinopril and Imdur held per Dr. Victorino December recommendation due to relative hypotension, which is likely contributing to recurrent falls falls.  Decrease Toprol XL to 100m daily due to hypotension.  Check cortisol. 5. Hyperlipidemia- Dr. CSallyanne Kusterhad discontinued Lipitor due to ?muscle effects.  CK normal- consider restarting  6. Hypothyroid- Synthroid increased to prior dose.  Recheck in  8-12 weeks.  7. Cor Pulmonale: Stable  8. CAD- continue ASA, Bblocker. Hold statin per Dr. Sallyanne Kuster.  9. Afib- Rate controlled. Has been taken off Coumadin due to fall risk. On ASA only  10. Disposition- anticipate discharge to home tomorrow with home health PT/OT, RN, and [facility] support if hypotension resolves. Declines SNF rehab.  Ambulate.   LOS: 2 days   Victoria Fisher 07/09/2013, 10:03 AM

## 2013-07-10 LAB — BASIC METABOLIC PANEL
BUN: 49 mg/dL — AB (ref 6–23)
CALCIUM: 11.3 mg/dL — AB (ref 8.4–10.5)
CO2: 34 mEq/L — ABNORMAL HIGH (ref 19–32)
Chloride: 93 mEq/L — ABNORMAL LOW (ref 96–112)
Creatinine, Ser: 1.09 mg/dL (ref 0.50–1.10)
GFR calc Af Amer: 55 mL/min — ABNORMAL LOW (ref >90)
GFR, EST NON AFRICAN AMERICAN: 48 mL/min — AB (ref >90)
GLUCOSE: 92 mg/dL (ref 70–99)
Potassium: 4.8 mEq/L (ref 3.7–5.3)
Sodium: 140 mEq/L (ref 137–147)

## 2013-07-10 LAB — CBC
HEMATOCRIT: 37.3 % (ref 36.0–46.0)
HEMOGLOBIN: 11.8 g/dL — AB (ref 12.0–15.0)
MCH: 29.4 pg (ref 26.0–34.0)
MCHC: 31.6 g/dL (ref 30.0–36.0)
MCV: 92.8 fL (ref 78.0–100.0)
Platelets: 156 10*3/uL (ref 150–400)
RBC: 4.02 MIL/uL (ref 3.87–5.11)
RDW: 18.1 % — ABNORMAL HIGH (ref 11.5–15.5)
WBC: 7.2 10*3/uL (ref 4.0–10.5)

## 2013-07-10 LAB — CALCIUM, IONIZED: Calcium, Ion: 1.35 mmol/L — ABNORMAL HIGH (ref 1.13–1.30)

## 2013-07-10 MED ORDER — SODIUM CHLORIDE 0.9 % IV BOLUS (SEPSIS)
200.0000 mL | Freq: Once | INTRAVENOUS | Status: AC
  Administered 2013-07-10: 200 mL via INTRAVENOUS

## 2013-07-10 NOTE — Progress Notes (Signed)
Physical Therapy Treatment Patient Details Name: Victoria Fisher MRN: [id number] DOB: 06-06-1934 Today's Date: Aug 02, 2013    History of Present Illness Pt was admitted for FTT/frequent falls.      PT Comments    Pt making steady progress with mobility today.  Follow Up Recommendations  Home health PT;SNF     Equipment Recommendations  None recommended by PT       Precautions / Restrictions Precautions Precautions: Fall Restrictions Weight Bearing Restrictions: No    Mobility  Bed Mobility               General bed mobility comments: not assessed: pt in recliner before and after session  Transfers Overall transfer level: Needs assistance Equipment used: Rolling walker (2 wheeled) Transfers: Sit to/from Stand Sit to Stand: Supervision         General transfer comment: from both the recliner and toilet. demo'd safe technique  Ambulation/Gait Ambulation/Gait assistance: Min guard;Supervision Ambulation Distance (Feet): 250 Feet Assistive device: Rolling walker (2 wheeled) Gait Pattern/deviations: Step-through pattern;Decreased stride length Gait velocity: decreased Gait velocity interpretation: Below normal speed for age/gender General Gait Details: pt with improved cervical extension with gait today, able to achieve near neutral. no LOB and improved foot clearance noted.          Cognition Arousal/Alertness: Awake/alert Behavior During Therapy: WFL for tasks assessed/performed Overall Cognitive Status: Within Functional Limits for tasks assessed                    PT Goals (current goals can now be found in the care plan section) Acute Rehab PT Goals Patient Stated Goal: to figure out what is wrong with her neck.  to  be able to care for  husband PT Goal Formulation: With patient Time For Goal Achievement: 07/21/13 Potential to Achieve Goals: Good Progress towards PT goals: Progressing toward goals    Frequency  Min 3X/week    PT  Plan Current plan remains appropriate       End of Session Equipment Utilized During Treatment: Gait belt Activity Tolerance: Patient tolerated treatment well Patient left: in chair;with call bell/phone within reach     Time: 1217-1240 PT Time Calculation (min): 23 min  Charges:  $Gait Training: 23-37 mins                    G Codes:      Willow Ora 08-02-13, 1:30 PM  Willow Ora, PTA Office- [phone]

## 2013-07-10 NOTE — Care Management Note (Addendum)
CARE MANAGEMENT NOTE 07/10/2013  Patient:  Victoria Fisher, Victoria Fisher   Account Number:  [id number]  Date Initiated:  07/07/2013  Documentation initiated by:  Jasmine Pang  Subjective/Objective Assessment:     Action/Plan:   CM will follow for progression and d/c planning.  07/10/13 Spoke with pt son, with her permission, [name], please see note below.   Anticipated DC Date:  07/10/2013   Anticipated DC Plan:  [place]         Choice offered to / List presented to:          [name] arranged  HH-1 RN  [place]   Status of service:  In process, will continue to follow Medicare Important Message given?   (If response is "NO", the following Medicare IM given date fields will be blank) Date Medicare IM given:   Date Additional Medicare IM given:    Discharge Disposition:  [place]  Per UR Regulation:    If discussed at Long Length of Stay Meetings, dates discussed:    Comments:     07/10/2013 Met with pt for long discussion of d/c needs. Pt continues to decline short term rehab and wishes to return to home with HHPT and [place], she is requesting a Tub bench for bathing. Pt states repeatly that she has friends who are constantly in and out of the home to support both she and her husband. She also states that she has hired one friend to spend time in the home assisting  with household chores. She gave this CM permission to call her son , [name] in [place] who has recently become Healthcare POA, grandson is durable POA. I spoke with [name] who confirmed their plan for the pt is to return to home and he and the grandson are currently searching for in home care for up to 10 hr per day. Both the pt and her son wish to continue services with [facility]. I will contact that agency tomorrow 07/11/2013 when the office is open to valid the service this pt is currently receiving and sent new orders. Mr  Alease Medina stated that they were attempting to hire a nurse for [place] per day, I offered to send/fax info on local agencies and advised that a RN was really not needed for this extended period and suggested that they contact agencies with CNA as the other would be cost prohabitive and not needed as most of the pt needs do not require the skill level of a Therapist, sports. As he wishes to continue with the currently [facility] agency, a [place] and HHPT will continue. We also briefly discussed Assisted Living for both the pt and her husband. This CM again offered to sent/fax info re local ALF.  Per pt and son, plan is to return to home, will follow up current [facility] agency. Pt son, [name] is anticiapating a d/c to home on Tuesday, Jul 11, 2013, he states that the pt has transportation. Jasmine Pang RN MPH, case manager, [phone]  07/07/2013 Per pt she is active with [facility],

## 2013-07-10 NOTE — Progress Notes (Signed)
CSW spoke to patient today about going to short term rehab. Patient states she does not want to go to rehab and refuses to be faxed out to SNF facilities as a backup plan. Patient states she just wants to go home and be with her husband who needs her. Patient states she has 24 hour care at home and does not want to be forced to go to rehab. CSW notified Case Manager and continues to follow.  Jeanette Caprice, MSW, [place]

## 2013-07-10 NOTE — Progress Notes (Signed)
Occupational Therapy Treatment Patient Details Name: Victoria Fisher MRN: [id number] DOB: 10-20-1934 Today's Date: 07/10/2013    History of present illness Pt was admitted for FTT/frequent falls.     OT comments  Pt visibly upset when I arrived:  States she has to go to SNF and doesn't want to leave her husband.  On Friday, she told me she was going to see if children in [place] could help her husband.  She plans to proceed with this.  She also said they are coordinating something at [facility] for tomorrow.  She wasn't sure what level of care this was.  Participated well with OT.  She wanted to walk to decrease leg pain; told her PT would be coming later, and she wanted to walk both now and later.  Walked to nursing station to request coffee.  Follow Up Recommendations  SNF    Equipment Recommendations  Tub/shower bench    Recommendations for Other Services      Precautions / Restrictions Precautions Precautions: Fall Restrictions Weight Bearing Restrictions: No       Mobility Bed Mobility                  Transfers     Transfers: Sit to/from Stand Sit to Stand: Min guard         General transfer comment: from chair and recliner, min guard for safety    Balance                                   ADL       Grooming: Wash/dry face;Supervision/safety;Standing                   Toilet Transfer: Min guard;Ambulation;Regular Toilet ; grab bars            Functional mobility during ADLs: Min guard;Rolling walker General ADL Comments: close guard for safety ambulating with RW. Pt ambulated to nursing station to request coffee--she states that walking helps leg pain.  Pt able to lift head to neutral.  Pt had already bathed this am.        Vision                     Perception     Praxis      Cognition   Behavior During Therapy: [facility] for tasks assessed/performed Overall Cognitive Status: Within Functional Limits for  tasks assessed                       Extremity/Trunk Assessment               Exercises     Shoulder Instructions       General Comments      Pertinent Vitals/ Pain       bil legs 5/10, pain improved with ambulation; repositioned with legs elevated in chair  Home Living                                          Prior Functioning/Environment              Frequency Min 2X/week     Progress Toward Goals  OT Goals(current goals can now be found in the care plan section)  Progress towards OT goals: Progressing toward goals  Acute Rehab OT Goals Patient Stated Goal: to figure out what is wrong with her neck.  to  be able to care for  husband OT Goal Formulation: With patient Time For Goal Achievement: 07/21/13 Potential to Achieve Goals: Good  Plan      Co-evaluation                 End of Session     Activity Tolerance Patient tolerated treatment well   Patient Left in chair;with call bell/phone within reach;with chair alarm set   Nurse Communication          Time: [phone] OT Time Calculation (min): 23 min  Charges: OT General Charges $OT Visit: 1 Procedure OT Treatments $Self Care/Home Management : 8-22 mins $Therapeutic Activity: 8-22 mins  [facility] 07/10/2013, 10:38 AM Lesle Chris, OTR/L [phone] 07/10/2013

## 2013-07-10 NOTE — Progress Notes (Signed)
Victoria Phillips RN with [employer], tel: [phone] states that "pt is extremely unreliable, does not take medications or will overtake medications, extremely forgetful," states that "she is not safe at home." Son Loa Socks currently working on acquiring POA, was supposed to meet with a lawyer on Friday. States that patient is not safe to discharge back home. Will discuss with CM/SW this AM.

## 2013-07-10 NOTE — Progress Notes (Signed)
Subjective: Doing better c better BPs. Ambulating with walker some without lightheadedness.  Sitting in chair by window and looks better than expected. She is practically all set for D/c c HHPT - I see note from Eli Phillips RN with [employer], tel: [phone] states that "pt is extremely unreliable, does not take medications or will overtake medications, extremely forgetful," states that "she is not safe at home." Son Loa Socks currently working on acquiring POA, was supposed to meet with a lawyer on Friday. States that patient is not safe to discharge back home. This infor was to be discussed with CM/SW.  She has no current new issues. Ca much higher?? 11.3 up from 9.6-10.3-10.5?? Hopefully lab error.  Objective: Vital signs in last 24 hours: Temp:  [97.7 F (36.5 C)-98.4 F (36.9 C)] 98.4 F (36.9 C) (05/25 0615) Pulse Rate:  [70-84] 75 (05/25 0615) Resp:  [17-18] 18 (05/25 0615) BP: (99-108)/(41-56) 99/56 mmHg (05/25 0615) SpO2:  [97 %-98 %] 98 % (05/25 0615) Weight change:  Last BM Date: 07/10/13  CBG (last 3)  No results found for this basename: GLUCAP,  in the last 72 hours  Intake/Output from previous day: 05/24 0701 - 05/25 0700 In: 1140 [P.O.:1140] Out: 1376 [Urine:1375; Stool:1] Intake/Output this shift:    General appearance: alert and no distress.  She knows Name, date (off by @ 2 numerical days) but knows it is May 2015 and a Monday.  She is alert to her condition as well Eyes: no scleral icterus Throat: oropharynx moist without erythema Resp: clear to auscultation bilaterally Cardio: distant heart sounds, irregulalrly irregular GI: soft, non-tender; bowel sounds normal; no masses,  no organomegaly Extremities: no clubbing, cyanosis; chronic edema changes, thickened skin  Lab Results:  Recent Labs  07/09/13 0616 07/10/13 0550  NA 139 140  K 4.0 4.8  CL 96 93*  CO2 33* 34*  GLUCOSE 91 92  BUN 45* 49*  CREATININE 1.06 1.09  CALCIUM 10.5 11.3*   No  results found for this basename: AST, ALT, ALKPHOS, BILITOT, PROT, ALBUMIN,  in the last 72 hours  Recent Labs  07/09/13 0616 07/10/13 0550  WBC 4.6 7.2  HGB 11.2* 11.8*  HCT 35.3* 37.3  MCV 93.4 92.8  PLT 131* 156   Lab Results  Component Value Date   INR 1.15 07/07/2013   INR 1.45 06/08/2013   INR 3.6 05/30/2013    Recent Labs  07/08/13 0950  CKTOTAL 70   No results found for this basename: TSH, T4TOTAL, FREET3, T3FREE, THYROIDAB,  in the last 72 hours No results found for this basename: VITAMINB12, FOLATE, FERRITIN, TIBC, IRON, RETICCTPCT,  in the last 72 hours  Studies/Results: No results found.   Medications: Scheduled: . aspirin EC  81 mg Oral QPM  . cholecalciferol  1,000 Units Oral Daily  . feeding supplement (ENSURE COMPLETE)  237 mL Oral BID BM  . levothyroxine  112 mcg Oral q morning - 10a  . magnesium oxide  400 mg Oral Daily  . metoprolol succinate  25 mg Oral Daily  . morphine  60 mg Oral Q12H  . polyethylene glycol  17 g Oral Daily  . potassium chloride  20 mEq Oral BID  . senna  2 tablet Oral Daily  . torsemide  40 mg Oral BID   Continuous:   Assessment/Plan: 1. Failure to thrive/Recurrent Falls- multifactorial due to polypharmacy, unsteady gait, chronic Milroy's, OSA, hypotension. She has seen Neurology who does not see a reversible neurologic cause. CK =  70 and ESR = 37. Continue PT/OT. Orthostatics negative. Eli Phillips RN with [facility] homecare reports pt is unsafe at home alone.  Due to the rapid increase in Calcium and need to get it down we will keep her till tomorrow and sort out living situation.  She seems competent to me even if she makes poor healthcare decisions.  She has given me/Dr Osborne Casco and healthcare team to talk to [name] (son = Healthcare POA) or Loa Socks Biomedical scientist = Financial POA).  I Talked to Dr Brigitte Pulse who rounded on her over the weekend who concurs with my opinion.  We are all aware that she is a very high fall risk.  Cognitively she is  intact.  She understands her situation and options.  She does not want placement as she cares for her husband with Dementia.  When she goes home she will need a HH safety eval.  I have asked CW/SW to address current home situation and needs.  However, she will likely go home over the next 24 hrs and ask family to push for more Home care - like Home [facility] etc??  2. Milroy's disease/chronic pain- continue diuretics and chronic pain regimen. Saline lock IV again after 200 cc IVF Bolus. She said her LE edema is the best it has been in ages. 3. Hypokalemia- improved with increased KCL dosage. May have be contributing to weakness. Today 4.8 so watch that she does not become Hyperkalemic. 4. HTN with hypotension- Lisinopril and Imdur held per Dr. Victorino December recommendation due to relative hypotension, which is likely contributing to recurrent falls falls.  Toprol XL decreased to 56m daily due to hypotension.  Cortisol = 16.3 and fine for condition. 5. Hyperlipidemia- Dr. CSallyanne Kusterhad discontinued Lipitor due to ?muscle effects.  CK normal- consider restarting statin as outpt if warranted. 6. HYPERCALCEMIA c Ca to 11.3. Up from 10.5. D/c Vit D. Recheck Ca/PTHi/Vit D 7. Hypothyroid- Synthroid increased to prior dosage.  Recheck in 8-12 weeks.  7. Cor Pulmonale: Stable  8. CAD- continue ASA, Bblocker. Hold statin per Dr. CSallyanne Kuster  9. Afib- Rate controlled. Has been taken off Coumadin due to fall risk. On ASA only  10. Disposition- anticipate discharge to home soon but may need 24 hr care (she does live with her husband who has Dementia) based on information that NEli PhillipsRN with [employer]  When she goes home she will also need home health PT/OT, RN, Safety eval, and THN support if hypotension resolves. Declines SNF rehab.  Ambulate. She and husband may need ALF but if she will not go I think she is capable of decision making and we will encourage 24 supervision.  I am confused that NIzora Gala is concerned @ pt taking meds correctly but was the one that Dr TOsborne Cascodocumented substituted 2 x 354mof her fast release PRN Morphine which caused issues itself??.     LOS: 3 days   JoPrecious Reel/25/2015, 8:31 AM

## 2013-07-11 ENCOUNTER — Ambulatory Visit: Payer: Medicare Other | Admitting: Cardiology

## 2013-07-11 ENCOUNTER — Encounter (HOSPITAL_COMMUNITY): Payer: Self-pay | Admitting: Emergency Medicine

## 2013-07-11 LAB — BASIC METABOLIC PANEL
BUN: 55 mg/dL — ABNORMAL HIGH (ref 6–23)
CALCIUM: 10.8 mg/dL — AB (ref 8.4–10.5)
CO2: 33 mEq/L — ABNORMAL HIGH (ref 19–32)
Chloride: 93 mEq/L — ABNORMAL LOW (ref 96–112)
Creatinine, Ser: 1.11 mg/dL — ABNORMAL HIGH (ref 0.50–1.10)
GFR calc Af Amer: 54 mL/min — ABNORMAL LOW (ref >90)
GFR calc non Af Amer: 47 mL/min — ABNORMAL LOW (ref >90)
Glucose, Bld: 99 mg/dL (ref 70–99)
Potassium: 4.8 mEq/L (ref 3.7–5.3)
SODIUM: 135 meq/L — AB (ref 137–147)

## 2013-07-11 LAB — CBC
HCT: 31.5 % — ABNORMAL LOW (ref 36.0–46.0)
HEMOGLOBIN: 10.1 g/dL — AB (ref 12.0–15.0)
MCH: 29.7 pg (ref 26.0–34.0)
MCHC: 32.1 g/dL (ref 30.0–36.0)
MCV: 92.6 fL (ref 78.0–100.0)
Platelets: 124 10*3/uL — ABNORMAL LOW (ref 150–400)
RBC: 3.4 MIL/uL — ABNORMAL LOW (ref 3.87–5.11)
RDW: 18.2 % — ABNORMAL HIGH (ref 11.5–15.5)
WBC: 5.2 10*3/uL (ref 4.0–10.5)

## 2013-07-11 LAB — VITAMIN D 25 HYDROXY (VIT D DEFICIENCY, FRACTURES): Vit D, 25-Hydroxy: 50 ng/mL (ref 30–89)

## 2013-07-11 LAB — PTH, INTACT AND CALCIUM
CALCIUM TOTAL (PTH): 10.6 mg/dL — AB (ref 8.4–10.5)
PTH: 134.4 pg/mL — ABNORMAL HIGH (ref 14.0–72.0)

## 2013-07-11 MED ORDER — METOPROLOL SUCCINATE ER 25 MG PO TB24: 25.0000 mg | ORAL_TABLET | Freq: Every day | ORAL | Status: DC

## 2013-07-11 MED ORDER — MAGNESIUM OXIDE 400 (241.3 MG) MG PO TABS: 400.0000 mg | ORAL_TABLET | Freq: Every day | ORAL | Status: DC

## 2013-07-11 MED ORDER — ENSURE COMPLETE PO LIQD: 237.0000 mL | Freq: Two times a day (BID) | ORAL | Status: DC

## 2013-07-11 MED ORDER — SENNOSIDES 8.6 MG PO TABS: 2.0000 | ORAL_TABLET | Freq: Every day | ORAL | Status: DC

## 2013-07-11 NOTE — Progress Notes (Signed)
Pt signed d/c papers, IV removed. Pt is attempting to locate a ride home. Discussed medication changes, encouraged pt to care for self and make sure she is eating and drinking healthy foods/snacks/supplements. Instructed to check bp prior to bp meds. Pt verbalized understanding. Will continue to monitor.

## 2013-07-11 NOTE — Clinical Documentation Improvement (Signed)
Possible Clinical Conditions?  Cerebral Edema Encephalopathy  Other Condition Cannot Clinically Determine   Supporting Information:  (As per CT Scan/Head 07-07-13) Impression:  2. Mild cerebral atrophy with chronic microvascular ischemic changes in the cerebral white matter.   Thank You, Alessandra Grout, RN, BSN, CCDS, Clinical Documentation Specialist:  [phone]  [phone]=[employer]- Health Information Management

## 2013-07-11 NOTE — Progress Notes (Signed)
Occupational Therapy Treatment Patient Details Name: CHRISTI WIRICK MRN: [id number] DOB: [birth year] Today's Date: 07/11/2013    History of present illness Pt preparing to go home today   OT comments  Pt requires supervision for safety and DME for safe discharge home.  Will have friends purchase tub equipment in the community.  Follow Up Recommendations  Home health OT;Supervision/Assistance - 24 hour    Equipment Recommendations       Recommendations for Other Services      Precautions / Restrictions Precautions Precautions: Fall Restrictions Weight Bearing Restrictions: No       Mobility Bed Mobility Overal bed mobility:  (pt up in chair)             General bed mobility comments: not assessed: pt in recliner before and after session  Transfers Overall transfer level: Needs assistance Equipment used: Rolling walker (2 wheeled) Transfers: Sit to/from Stand Sit to Stand: Supervision         General transfer comment: pt using RW consistently    Balance Overall balance assessment:  (pt loses her balance if she does not hold onto RW)                                 ADL                                         General ADL Comments: Pt preparing to discharge.  Will have personal care attendant at home to care for her and her husband.  Instructed pt in places she can consider for purchasing a tub transfer bench.        Vision                     Perception     Praxis      Cognition   Behavior During Therapy: WFL for tasks assessed/performed Overall Cognitive Status: Within Functional Limits for tasks assessed                       Extremity/Trunk Assessment               Exercises   Shoulder Instructions       General Comments      Pertinent Vitals/ Pain       No pain reported  Home Living                                          Prior Functioning/Environment               Frequency Min 2X/week     Progress Toward Goals  OT Goals(current goals can now be found in the care plan section)  Progress towards OT goals: Progressing toward goals  Acute Rehab OT Goals Patient Stated Goal: home today  Plan Discharge plan needs to be updated    Co-evaluation                 End of Session     Activity Tolerance Patient tolerated treatment well   Patient Left in chair;with call bell/phone within reach;with family/visitor present   Nurse Communication          Time: 8101-7510 OT Time  Calculation (min): 16 min  Charges: OT General Charges $OT Visit: 1 Procedure OT Treatments $Self Care/Home Management : 8-22 mins  Haze Boyden Arley Garant 07/11/2013, 11:19 AM [phone]

## 2013-07-11 NOTE — Progress Notes (Signed)
Tele box 6E09 removed and returned to [place] CMT notified.

## 2013-07-11 NOTE — Care Management Note (Signed)
CARE MANAGEMENT NOTE 07/11/2013  Patient:  Victoria Fisher, Victoria Fisher   Account Number:  [id number]  Date Initiated:  07/07/2013  Documentation initiated by:  Jasmine Pang  Subjective/Objective Assessment:     Action/Plan:   CM will follow for progression and d/c planning.  07/10/13 Spoke with pt son, with her permission, [name], please see note below.   Anticipated DC Date:  07/11/2013   Anticipated DC Plan:  [place]         [facility] Choice  HOME HEALTH   Choice offered to / List presented to:  C-1 Patient   DME arranged  TUB BENCH      DME agency  [place] arranged  HH-1 RN  [place] agency  [place]   Status of service:  Completed, signed off Medicare Important Message given?  YES (If response is "NO", the following Medicare IM given date fields will be blank) Date Medicare IM given:  07/11/2013 Date Additional Medicare IM given:    Discharge Disposition:  [place]  Per UR Regulation:    If discussed at Long Length of Stay Meetings, dates discussed:    Comments:  07/11/2013 Pt for d/c today, IM signed, [place] notified and info faxed to that agency. [facility] will bring tub bench to room. Plan for d/c today. Jasmine Pang RN MPH, case manager, [phone]  07/10/2013 Met with pt for long discussion of d/c needs. Pt continues to decline short term rehab and wishes to return to home with HHPT and [place], she is requesting a Tub bench for bathing. Pt states repeatly that she has friends who are constantly in and out of the home to support both she and her husband. She also states that she has hired one friend to spend time in the home assisting  with household chores. She gave this CM permission to call her son , [name] in [place] who has recently become Healthcare POA, grandson is durable POA. I spoke with [name] who confirmed their plan for the pt is to return to home  and he and the grandson are currently searching for in home care for up to 10 hr per day. Both the pt and her son wish to continue services with [facility]. I will contact that agency tomorrow 07/11/2013 when the office is open to valid the service this pt is currently receiving and sent new orders. Mr Alease Medina stated that they were attempting to hire a nurse for [place] per day, I offered to send info on local agencies and advised that a RN was really not needed for this extended period and suggested that they contact agencies with CNA as the other would be cost prohabitive and not needed as most of the pt needs do not require the skill level of a Therapist, sports. As he wishes to continue with the currently [facility] agency, a [place] and HHPT will continue. We also briefly discussed Assisted Living for both the pt and her husband. This CM again offered to sent/fax info re local ALF.  Per pt and son, plan is to return to home, will follow up current [facility] agency. Pt son, [name] is anticiapating a d/c to home on Tuesday, Jul 11, 2013, he states that the pt has transportation. Jasmine Pang RN MPH, case manager, [phone]  07/07/2013 Per pt she is active with [facility],

## 2013-07-11 NOTE — Progress Notes (Signed)
Physical Therapy Treatment Patient Details Name: Victoria Fisher MRN: [id number] DOB: 19-Oct-1934 Today's Date: 07/11/2013    History of Present Illness Pt preparing to go home today    PT Comments    Pt is improved and ready to d/c with HHPT follow up.  She continued to have edeman in both feet, but stated she legs are much better.  She says her family is working on extra help at home and possibly an assisted living for her and her husband so they can enjoy their time together  Follow Up Recommendations  Home health PT     Equipment Recommendations  None recommended by PT    Recommendations for Other Services  (pt reports family is considering assisted living for pt and husbanc)     Precautions / Restrictions Precautions Precautions: Fall Restrictions Weight Bearing Restrictions: No    Mobility  Bed Mobility               General bed mobility comments: not assessed: pt in recliner before and after session  Transfers Overall transfer level: Needs assistance Equipment used: Rolling walker (2 wheeled) Transfers: Sit to/from Stand Sit to Stand: Supervision         General transfer comment: pt using RW consistently  Ambulation/Gait Ambulation/Gait assistance: Min guard;Supervision Ambulation Distance (Feet): 350 Feet Assistive device: Rolling walker (2 wheeled)   Gait velocity: decreased   General Gait Details: improved distance and use or RW today.  Pt reports she is feeling stronger and is ready to go home   Stairs            Wheelchair Mobility    Modified Rankin (Stroke Patients Only)       Balance Overall balance assessment:  (pt loses her balance if she does not hold onto RW)                                  Cognition Arousal/Alertness: Awake/alert Behavior During Therapy: WFL for tasks assessed/performed Overall Cognitive Status: Within Functional Limits for tasks assessed                      Exercises  General Exercises - Lower Extremity Ankle Circles/Pumps: AROM;Both;20 reps;Seated Hip Flexion/Marching: AROM;Both;10 reps;Standing (holding onto RW) Heel Raises: AROM;Both;10 reps;Seated Other Exercises Other Exercises: repeated sit to stand x 5 reps.  Pt visibly fatigued after this activity Other Exercises: sittng with back unsupported, 10 reps of bilateral diagonal arm raises, pt with c/o neck pain after this activity.  Noted to have increasead msucle tightness in bilateral upper traps, but states it is better than it was    General Comments        Pertinent Vitals/Pain VSS; c/o soreness in neck, but it is much better than it was    Home Living                      Prior Function            PT Goals (current goals can now be found in the care plan section) Progress towards PT goals: Progressing toward goals    Frequency  Min 3X/week    PT Plan Current plan remains appropriate    Co-evaluation             End of Session   Activity Tolerance: Patient tolerated treatment well Patient left: in chair;with call bell/phone within reach  Time: 5916-3846 PT Time Calculation (min): 27 min  Charges:  $Gait Training: 8-22 mins $Therapeutic Exercise: 8-22 mins                    G Codes:     Teresa K. Owens Shark, [place] 07/11/2013, 9:31 AM

## 2013-07-11 NOTE — Discharge Summary (Addendum)
DISCHARGE SUMMARY  Victoria Fisher  MR#: [id number]  DOB:Oct 09, 1934  Date of Admission: 07/07/2013 Date of Discharge: 07/11/2013  Attending Physician:Richard W Tisovec  Patient's KXF:GHWEXHB,ZJIRCVE W, MD  Consults:  PT, OT, Social work  Discharge Diagnoses: Active Problems:   Failure to thrive   Protein-calorie malnutrition, severe Milroy's disease .  Anxiety  .  Arthritis  .  COPD (chronic obstructive pulmonary disease)  .  Emphysema of lung  .  Tuberculosis  .  MI (myocardial infarction)  1999  Unstable angina  .  Lung nodule  .  Diverticulosis of colon  .  Depression  .  Milroy's disease  Lymphatic disorder diagnosed at [facility]  .  Chronic anticoagulation  A fib. On Coumadin.  .  IBS (irritable bowel syndrome)  .  Edema of both legs  Venous Doppler 05/11/12 was normal & no evidence of thrombus or thrombophlebitis. Chronic LE edema related to both cor pulmonale & lymphedema, hyperlipidemia & HTN involving coronary atherosclerosis by cardiac cath in 2004. Cath showed a tiny old occlusion of the optional diagonal branch & also proximal LAD.  [name]  Chronic pain syndrome  .  Hyperlipidemia  On a statin. Catheterization 02/12/03 Showed a tiny old occlusion of the optional diagonal branch & also proximal LAD.  [name]  Hypertension  Myoview 07/2011 EF = >55%. RV was moderately dilated. LA was severely dilated. Catheterization 02/12/03 Showed a tiny old occlusion of the optional diagonal branch & also proximal LAD.  [name]  Coronary artery disease  .  Coronary atherosclerosis  Mild. Myoview 07/2011 EF = >55%. RV was moderately dilated. LA was severely dilated. Catheterization 02/12/03 Showed a tiny old occlusion of the optional diagonal branch & also proximal LAD.  [name]  Chronic cor pulmonale  Most likely due to COPD (cannot rule out contribution of diastolic LV dysfunction). ECHO 07/30/11 pulmonary artery pressure was estimated to be 50-60% mmHg & there was moderate tricuspid  insufficiency.  .  Frequent falls  Possibly due to orthostatic hypotension but we have not seen that on our visits. Recent fall 05/2012 with scalp hematoma, swollen left LE, & black eyes.  .  Obstructive sleep apnea  Polysomnogram 09/25/11 AHI: 53.6/hr overall & 57.1/hr during REM sleep. Poor compliance previously with CPAP  .  Pulmonary hypertension  Pullmonary hypertension, chronic cor pulmonale w/pulmonary artery pressures of 50&#x2011; 60 mmHg.  .  CHF (congestive heart failure)  Acute on chronic, diastolic, improved. Predominantly right heart failure due to cor pulmonale & in turn this is most likely due to untreated OSA.  [name]  Dyspnea on exertion  Reluctant to take diuretics  .  Atrial fibrillation, chronic  On Coumadin & a beta-blocker  .  Cancer  Carcinoma of sigmoid colon [place].  [name]  Bowel obstruction  Caused by scar tissue  .  Hypothyroidism  2002  On hormone replacement therapy  .  Asthma  On nebulizer  .  Insomnia    Discharge Medications:   Medication List    STOP taking these medications       isosorbide dinitrate 10 MG tablet  Commonly known as:  ISORDIL      TAKE these medications       aspirin EC 81 MG tablet  Take 81 mg by mouth every evening.     atorvastatin 40 MG tablet  Commonly known as:  LIPITOR  Take 40 mg by mouth daily.     feeding supplement (ENSURE COMPLETE) Liqd  Take 237 mLs by mouth  2 (two) times daily between meals.     levothyroxine 112 MCG tablet  Commonly known as:  SYNTHROID, LEVOTHROID  Take 1 tablet (112 mcg total) by mouth every morning.     lisinopril 20 MG tablet  Commonly known as:  PRINIVIL,ZESTRIL  Take 20 mg by mouth daily.     magnesium oxide 400 (241.3 MG) MG tablet  Commonly known as:  MAG-OX  Take 1 tablet (400 mg total) by mouth daily.     metoprolol succinate 25 MG 24 hr tablet  Commonly known as:  TOPROL-XL  Take 1 tablet (25 mg total) by mouth daily.     morphine 60 MG 12 hr tablet   Commonly known as:  MS CONTIN  Take 60 mg by mouth every 12 (twelve) hours.     morphine 30 MG tablet  Commonly known as:  MSIR  Take 30 mg by mouth daily as needed (breakthrough pain morning).     nitroGLYCERIN 0.4 MG/SPRAY spray  Commonly known as:  NITROLINGUAL  Place 1 spray under the tongue every 5 (five) minutes as needed for chest pain.     potassium chloride 10 MEQ tablet  Commonly known as:  K-DUR,KLOR-CON  Take 10 mEq by mouth daily.     senna 8.6 MG tablet  Commonly known as:  SENOKOT  Take 2-3 tablets (17.2-25.8 mg total) by mouth daily.     sertraline 100 MG tablet  Commonly known as:  ZOLOFT  Take 100 mg by mouth daily.     torsemide 20 MG tablet  Commonly known as:  DEMADEX  Take 20 mg by mouth 2 (two) times daily.        Hospital Procedures: Dg Chest 1 View  07/07/2013   CLINICAL DATA:  Hypotension.  History of fall.  EXAM: CHEST - 1 VIEW  COMPARISON:  Chest x-ray 06/08/2013.  FINDINGS: No acute consolidative airspace disease. No pleural effusions. No pneumothorax. No evidence of pulmonary edema. Heart size is mildly enlarged (unchanged). Upper mediastinal contours are unremarkable. Atherosclerosis in the thoracic aorta.  IMPRESSION: 1. No radiographic evidence of acute cardiopulmonary disease. 2. Mild cardiomegaly. 3. Atherosclerosis.   Electronically Signed   By: Vinnie Langton M.D.   On: 07/07/2013 03:03   Ct Head Wo Contrast  07/07/2013   CLINICAL DATA:  Multiple recent falls with injuries to the head.  EXAM: CT HEAD WITHOUT CONTRAST  CT CERVICAL SPINE WITHOUT CONTRAST  TECHNIQUE: Multidetector CT imaging of the head and cervical spine was performed following the standard protocol without intravenous contrast. Multiplanar CT image reconstructions of the cervical spine were also generated.  COMPARISON:  Head CT 05/14/2013. CT of the cervical spine 03/08/2013.  FINDINGS: CT HEAD FINDINGS  Mild cerebral edema. Patchy and confluent areas of decreased attenuation  are noted throughout the deep and periventricular white matter of the cerebral hemispheres bilaterally, compatible with chronic microvascular ischemic disease. No acute displaced skull fractures are identified. No acute intracranial abnormality. Specifically, no evidence of acute post-traumatic intracranial hemorrhage, no definite regions of acute/subacute cerebral ischemia, no focal mass, mass effect, hydrocephalus or abnormal intra or extra-axial fluid collections. The visualized paranasal sinuses and mastoids are well pneumatized, with exception of some mild multifocal mucosal thickening and a polypoid lesion in the left maxillary sinus, which may represent a maxillary sinus mucosal retention cyst or polyp.  CT CERVICAL SPINE FINDINGS  No acute displaced fractures of the cervical spine. 2 mm of anterolisthesis of C2 upon C3 is chronic and unchanged. Alignment is  otherwise anatomic. Prevertebral soft tissues are normal. Severe multilevel degenerative disc disease, most advanced at C5-C6. Mild multilevel facet arthropathy. Visualized portions of the upper thorax are unremarkable.  IMPRESSION: 1. No evidence of significant acute traumatic injury to the skull, brain or cervical spine. 2. Mild cerebral atrophy with chronic microvascular ischemic changes in the cerebral white matter. 3. Multilevel degenerative disc disease and cervical spondylosis again noted. 4. Mild paranasal sinus disease, as above, without acute features.   Electronically Signed   By: Vinnie Langton M.D.   On: 07/07/2013 02:53   Ct Cervical Spine Wo Contrast  07/07/2013   CLINICAL DATA:  Multiple recent falls with injuries to the head.  EXAM: CT HEAD WITHOUT CONTRAST  CT CERVICAL SPINE WITHOUT CONTRAST  TECHNIQUE: Multidetector CT imaging of the head and cervical spine was performed following the standard protocol without intravenous contrast. Multiplanar CT image reconstructions of the cervical spine were also generated.  COMPARISON:  Head CT  05/14/2013. CT of the cervical spine 03/08/2013.  FINDINGS: CT HEAD FINDINGS  Mild cerebral edema. Patchy and confluent areas of decreased attenuation are noted throughout the deep and periventricular white matter of the cerebral hemispheres bilaterally, compatible with chronic microvascular ischemic disease. No acute displaced skull fractures are identified. No acute intracranial abnormality. Specifically, no evidence of acute post-traumatic intracranial hemorrhage, no definite regions of acute/subacute cerebral ischemia, no focal mass, mass effect, hydrocephalus or abnormal intra or extra-axial fluid collections. The visualized paranasal sinuses and mastoids are well pneumatized, with exception of some mild multifocal mucosal thickening and a polypoid lesion in the left maxillary sinus, which may represent a maxillary sinus mucosal retention cyst or polyp.  CT CERVICAL SPINE FINDINGS  No acute displaced fractures of the cervical spine. 2 mm of anterolisthesis of C2 upon C3 is chronic and unchanged. Alignment is otherwise anatomic. Prevertebral soft tissues are normal. Severe multilevel degenerative disc disease, most advanced at C5-C6. Mild multilevel facet arthropathy. Visualized portions of the upper thorax are unremarkable.  IMPRESSION: 1. No evidence of significant acute traumatic injury to the skull, brain or cervical spine. 2. Mild cerebral atrophy with chronic microvascular ischemic changes in the cerebral white matter. 3. Multilevel degenerative disc disease and cervical spondylosis again noted. 4. Mild paranasal sinus disease, as above, without acute features.   Electronically Signed   By: Vinnie Langton M.D.   On: 07/07/2013 02:53    History of Present Illness: Patient is a 79 year old female with chronic LE edema due to Milroy's who had an evening fall prompting visit to the ER  Hospital Course: Victoria Fisher fell as she often does when not attended.  There was question as to whether the 2 fast acting  Morphine that her home health nurse substituted for the nonobtainable per pharmacy extended release may have made her more drowsy.  There is also note from home health that [facility] will self medicate with her diuretics not per schedule but per feel.  Victoria Fisher had multiple conversations about this as well as her not attending visits.  She had PT and OT assessments and she still insists on returning home with her current home care.  Son is working on getting more in the house as she desires to go home and watch her husband, also my patient (and also misses visits including one last week) because of his memory issues.  They also have friends and neighbors involved who need to help but not be involved on the medical side of things as Victoria Fisher notes that  this causes confusion.  She refused to have an FL-2 sent out and we cannot place her in a higher level of care.  She is competent to make this decision, but understands that I don't feel she has enough help at home and really needs 24 hour care.  [facility] or Home instead have been brought up and hopefully her family will be involved enough to include these 24 services to fill in care and time gaps.  She is being sent home today per her wishes.  Day of Discharge Exam BP 104/54  Pulse 60  Temp(Src) 97.8 F (36.6 C) (Axillary)  Resp 18  Ht 5\' 5"  (1.651 m)  Wt 67.586 kg (149 lb)  BMI 24.79 kg/m2  SpO2 100%  Physical Exam: General appearance: alert, cooperative and appears stated age  Head: Normocephalic,small posterior hematoma  Throat: lips, mucosa, and tongue normal; teeth and gums normal  Resp: clear to auscultation bilaterally  Cardio: regular rate and rhythm, S1, S2 normal, no murmur, click, rub or gallop  GI: soft, non-tender; bowel sounds normal; no masses, no organomegaly  Extremities: smaller, but chronic edema due to Milroy's disease  Pulses: 2+ and symmetric  Neurologic: Alert and oriented X 3, normal strength and tone. Normal symmetric reflexes.     Discharge Labs:  Recent Labs  07/09/13 0616 07/10/13 0550  NA 139 140  K 4.0 4.8  CL 96 93*  CO2 33* 34*  GLUCOSE 91 92  BUN 45* 49*  CREATININE 1.06 1.09  CALCIUM 10.5 11.3*    Recent Labs  07/10/13 0550 07/11/13 0540  WBC 7.2 5.2  HGB 11.8* 10.1*  HCT 37.3 31.5*  MCV 92.8 92.6  PLT 156 124*   Lab Results  Component Value Date   INR 1.15 07/07/2013   INR 1.45 06/08/2013   INR 3.6 05/30/2013    Recent Labs  07/08/13 0950  CKTOTAL 70   Discharge instructions:  Take meds as placed in box per list on discharge.  Home health nurse to fill box as prescribed.   Disposition: Home with home health Follow-up Appts: Follow-up with Dr. Osborne Casco at [facility] on Friday.  Victoria Fisher will call for appointment later today./.  Condition on Discharge: Stable Tests Needing Follow-up:[facility] at Friday OV. Total time of discharge: 60 minutes  Signed: Fransico Him [facility] 07/11/2013, 7:14 AM

## 2013-07-14 DIAGNOSIS — J45909 Unspecified asthma, uncomplicated: Secondary | ICD-10-CM | POA: Diagnosis not present

## 2013-07-14 DIAGNOSIS — E785 Hyperlipidemia, unspecified: Secondary | ICD-10-CM | POA: Diagnosis not present

## 2013-07-14 DIAGNOSIS — I2510 Atherosclerotic heart disease of native coronary artery without angina pectoris: Secondary | ICD-10-CM | POA: Diagnosis not present

## 2013-07-14 DIAGNOSIS — I4891 Unspecified atrial fibrillation: Secondary | ICD-10-CM | POA: Diagnosis not present

## 2013-07-14 DIAGNOSIS — E039 Hypothyroidism, unspecified: Secondary | ICD-10-CM | POA: Diagnosis not present

## 2013-07-14 DIAGNOSIS — Q820 Hereditary lymphedema: Secondary | ICD-10-CM | POA: Diagnosis not present

## 2013-07-14 DIAGNOSIS — I10 Essential (primary) hypertension: Secondary | ICD-10-CM | POA: Diagnosis not present

## 2013-07-14 DIAGNOSIS — I2789 Other specified pulmonary heart diseases: Secondary | ICD-10-CM | POA: Diagnosis not present

## 2013-08-03 ENCOUNTER — Encounter: Payer: Self-pay | Admitting: Neurology

## 2013-08-03 ENCOUNTER — Ambulatory Visit (INDEPENDENT_AMBULATORY_CARE_PROVIDER_SITE_OTHER): Payer: Medicare Other | Admitting: Neurology

## 2013-08-03 ENCOUNTER — Encounter (INDEPENDENT_AMBULATORY_CARE_PROVIDER_SITE_OTHER): Payer: Self-pay

## 2013-08-03 VITALS — BP 99/61 | HR 74 | Resp 17 | Wt 147.0 lb

## 2013-08-03 DIAGNOSIS — R29818 Other symptoms and signs involving the nervous system: Secondary | ICD-10-CM

## 2013-08-03 DIAGNOSIS — R29898 Other symptoms and signs involving the musculoskeletal system: Secondary | ICD-10-CM | POA: Insufficient documentation

## 2013-08-03 HISTORY — DX: Other symptoms and signs involving the musculoskeletal system: R29.898

## 2013-08-03 NOTE — Progress Notes (Signed)
[facility]  Victoria Fisher:  [name], [name]  Referring Alexarae Oliva: Haywood Pao, MD Primary Care Physician:  Haywood Pao, MD    Patient with untreated sleep apnea, CHF and chronic morphine therapy.  The patient uses a cane , feels weak and has frequent falls.  HPI:  Victoria Fisher is a 79 y.o. female , who is seen here in a revisit ;  After Victoria Fisher last visit from 06-20-13 to which she presented with a lot of fluid burden, she fell and was admitted to [facility] , where 50 liters of fluid were removed. She felt better had a certainly easier time breathing. Is study has her final taken place because of the hospitalization. She mostly has low blood pressure still, she is also history of emphysema on unstable angina atrial fibrillation and chronic anticoagulation. She states that her neck muscles have been weaker, she drops her  chin to the sternum - all worsening  after a fall in 07-08-13 . She fell to the right face and shoulder, and since than felt 'something " had changed in her neck muscle strength.  She sleeps with an elevated g head of bed, and with a horse shoepillow. Her neck still drooped. Her paraspinals and neck muscles are sore and tender. I will need a neuromuscular evaluation for the paraspinal and rectus  capitis weakness. She can rotate her neck , but there is an audible crepitus and she reports pain. Her shoulder can be elevated and she has normal tongue protrusion into either cheek.           Last visit was a consultation as a referral from Dr. Benn Moulder and  from Dr. Osborne Casco for a re- evaluation of sleep apnea.  Victoria Fisher is is reporting that she had frequent falls recently she fell place just in the first 2 weeks of April and after her visit with Dr. Loletha Grayer. at more falls in the meantime. Several of these falls have serious consequences she injured her head when she fell against the furniture a couple of  times. Dr. Loletha Grayer. noted her back was covered in bruises and she had severe leg weakness. She has difficulties getting up to a stable standing position and difficulties with ambulation with ultra husband to systems. She denied dizziness and has not experienced any fainting. He is concerned that she has not been compliant with CPAP after being diagnosed in 2013 with apnea at [facility], and now would not be able to obtain a machine after such a long time interval. Dr Claiborne Billings has not seen her in follow up, but she missed an appointment. The patient has mild coronary artery disease and a remote cardiac catheterization as well as a low risk nuclear perfusion study of the coronaries in June 2013. Right ventricle is moderately dilated and the left atrium is severely dilated. She now has mostly low blood pressures. She also has a history of COPD-emphysema. Unstable angina and atrial fibrillation and chronic anticoagulation. This together with coronary artery disease and dilated cardiomyopathy would make her a high risk patient for at Central and obstructive sleep apnea.  An outside  polysomnogram from 09-25-11 documented an AHI of 53.6 ,and REM AHI 57 /hr. .  She has dyspnea on exertion CHF and pulmonary hypertension. I reviewed also lipid panel and her metabolic profile. Doctors he is concerned about the folds by being on an anticoagulation regimen with warfarin. The patient is also not able to  tolerate more diuretics in spite of her edema given that her blood pressure now is actually little. Low back pressure and dizziness could be related and could be the reason for her falls. Her recent CTs have not shown bleeding into the skull.  She has paralysis in her ring and small fingers and had numbness in her fingertips with Raynauds.      Review of Systems: Out of a complete 14 system review, the patient complains of only the following symptoms, and all other reviewed systems are negative. Here , she  reports she  gets dizzy, with a spinning sensation, Vertigo . She endorsed joint swelling and joint pain, although old she has lost body weight but gained fluid weight is significant for and leg edema. She also states that she has constipation, recently developed the beginning of renal failure, blurred vision, hearing loss and skin rash. Numbness weakness dizziness anxiety and a feeling of not getting restful sleep. 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  Her surgical history is positive for colon cancer 1984 cholecystectomy 1984 appendectomy 1959 hysterectomy 1986 and she had another colon cancer in 1985.  In 1991 she developed ileus and was  to have the surgery to remove scar tissue in the abdomen.  Family history positive for lymphedema in her brother, and in her father. CHF  In her father ,    Her regular bedtime is 23.00 hours , falls asleep with her back propped up or in a recliner. 2 times nocturia, her rising time is about 5 AM. She will have 4-5 hours of nocturnal sleep. She naps in daytime when ever she is at rest.  She recently fell out of a chair in her sleep. Now her last fall was 3 weeks ago and she injured her neck. She awoke drooling recently , than noted blood on the tongue and lips- no tongue bite is seen today. Epistaxis frequenlty , sleepiness, fatigue, neck pain . Epworth15 out of 24      History   Social History  . Marital Status: Married    Spouse Name: Victoria Fisher    Number of Children: 3  . Years of Education: HS   Occupational History  .     Social History Main Topics  . Smoking status: Never Smoker   . Smokeless tobacco: Never Used  . Alcohol Use: No  . Drug Use: No  . Sexual Activity: No   Other Topics Concern  . Not on file   Social History Narrative   Patient is married Victoria Fisher) and lives at home with her husband.   Patient is retired.   Patient has three children and her husband has two children.    Patient is right-handed.   Patient drinks very little caffeine.   Patient has a high school education.    Family History  Problem Relation Age of Onset  . Heart disease Father   . Alzheimer's disease Mother     Past Medical History  Diagnosis Date  . Anxiety   . Arthritis   . COPD (chronic obstructive pulmonary disease)   . Emphysema of lung   . Tuberculosis   . MI (myocardial infarction) 1999    Unstable angina  . Lung nodule   . Diverticulosis of colon   . Depression   . Milroy's disease     Lymphatic disorder diagnosed at [facility]  . Chronic anticoagulation     A fib. On Coumadin.  . IBS (irritable  bowel syndrome)   . Edema of both legs     Venous Doppler 05/11/12 was normal & no evidence of thrombus or thrombophlebitis. Chronic LE edema related to both cor pulmonale & lymphedema, hyperlipidemia & HTN involving coronary atherosclerosis by cardiac cath in 2004. Cath showed a tiny old occlusion of the optional diagonal branch & also proximal LAD.  [name] Chronic pain syndrome   . Hyperlipidemia     On a statin. Catheterization 02/12/03 Showed a tiny old occlusion of the optional diagonal branch & also proximal LAD.  [name] Hypertension     Myoview 07/2011 EF = >55%. RV was moderately dilated. LA was severely dilated. Catheterization 02/12/03 Showed a tiny old occlusion of the optional diagonal branch & also proximal LAD.  [name] Coronary artery disease   . Coronary atherosclerosis     Mild. Myoview 07/2011 EF = >55%. RV was moderately dilated. LA was severely dilated. Catheterization 02/12/03 Showed a tiny old occlusion of the optional diagonal branch & also proximal LAD.  [name] Chronic cor pulmonale     Most likely due to COPD (cannot rule out contribution of diastolic LV dysfunction). ECHO 07/30/11 pulmonary artery pressure was estimated to be 50-60% mmHg & there was moderate tricuspid insufficiency.  . Frequent falls     Possibly due to orthostatic hypotension but we have not seen that on our  visits. Recent fall 05/2012 with scalp hematoma, swollen left LE, & black eyes.  . Obstructive sleep apnea     Polysomnogram 09/25/11 AHI: 53.6/hr overall & 57.1/hr during REM sleep. Poor compliance previously with CPAP  . Pulmonary hypertension     Pullmonary hypertension, chronic cor pulmonale w/pulmonary artery pressures of 50&#x2011; 60 mmHg.  . CHF (congestive heart failure)     Acute on chronic, diastolic, improved. Predominantly right heart failure due to cor pulmonale & in turn this is most likely due to untreated OSA.  [name] Dyspnea on exertion     Reluctant to take diuretics  . Atrial fibrillation, chronic     On Coumadin & a beta-blocker  . Cancer     Carcinoma of sigmoid colon [place].  [name] Bowel obstruction     Caused by scar tissue  . Hypothyroidism 2002    On hormone replacement therapy  . Asthma     On nebulizer  . Insomnia   . 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 Appendectomy  1956  . Abdominal hysterectomy  1983  . Colon resection      2 times  . Cholecystectomy  1984  . Colon biopsy    . Cardiac catheterization  02/12/03    Showed a tiny old occlusion of the optional diagonal branch & also proximal LAD.  [name] Colonoscopy  07/23/10    Current Outpatient Prescriptions  Medication Sig Dispense Refill  . aspirin EC 81 MG tablet Take 81 mg by mouth every evening.       . feeding supplement, ENSURE COMPLETE, (ENSURE COMPLETE) LIQD Take 237 mLs by mouth 2 (two) times daily between meals.  60 Bottle  2  . levothyroxine (SYNTHROID, LEVOTHROID) 112 MCG tablet Take 1 tablet (112 mcg total) by mouth every morning.  30 tablet  6  . magnesium oxide (MAG-OX) 400 (241.3 MG) MG tablet Take 1 tablet (400 mg total) by mouth daily.  30 tablet  5  . metoprolol succinate (TOPROL-XL) 25 MG 24 hr tablet Take 1 tablet (25 mg total) by mouth daily.  30 tablet  5  . morphine (MS CONTIN) 60 MG 12 hr tablet Take 60 mg by mouth every 12  (twelve) hours.      [name] morphine (MSIR) 30 MG tablet Take 30 mg by mouth daily as needed (breakthrough pain morning).       . nitroGLYCERIN (NITROLINGUAL) 0.4 MG/SPRAY spray Place 1 spray under the tongue every 5 (five) minutes as needed for chest pain.  12 g  1  . potassium chloride (K-DUR,KLOR-CON) 10 MEQ tablet Take 10 mEq by mouth daily.       [name] senna (SENOKOT) 8.6 MG tablet Take 2-3 tablets (17.2-25.8 mg total) by mouth daily.      . sertraline (ZOLOFT) 100 MG tablet Take 100 mg by mouth daily.      [name] torsemide (DEMADEX) 20 MG tablet Take 20 mg by mouth 2 (two) times daily.       [name] atorvastatin (LIPITOR) 40 MG tablet Take 40 mg by mouth daily.      [name] lisinopril (PRINIVIL,ZESTRIL) 20 MG tablet Take 20 mg by mouth daily.       No current facility-administered medications for this visit.    Allergies as of 08/03/2013 - Review Complete 08/03/2013  Allergen Reaction Noted  . Codeine Anaphylaxis, Hives, and Nausea And Vomiting 07/17/2009  . Ciprofloxacin Hives and Nausea And Vomiting 05/07/2012  . Latex Hives and Rash 07/17/2009  . Penicillins Hives and Rash 07/17/2009  . Sulfonamide derivatives Hives and Rash 07/17/2009    Vitals: BP 99/61  Pulse 74  Resp 17  Wt 147 lb (66.679 kg) Last Weight:  Wt Readings from Last 1 Encounters:  08/03/13 147 lb (66.679 kg)   Last Height:   Ht Readings from Last 1 Encounters:  07/07/13 5\' 5"  (1.651 m)    Physical exam:  General: The patient is awake, alert and appears not in acute distress. The patient is well groomed. She is pale, a little icteric , raynaud's fingers ,  Head: Normocephalic, atraumatic. Neck is supple. Mallampati, neck circumference: tongue with hunter's glossitis, red and lacquered. Pica , eats ice.   Cardiovascular:  irregular, with carotid bruit, and with distended neck veins. Respiratory:  The patient is short of breath. She has trouble sitting with her head elevated.  Skin:  With evidence of Severe Lymphatic Edema of  Melroys type- indurating the skin and muscle, extreme edema with fluid.  Up to waist and gluteal region- Raynaud's in hands.    Neurologic exam : The patient is awake and alert, oriented to place and time.  Memory subjective  described as impaired.  There is a normal attention span & concentration ability. Speech is fluent without dysarthria, dysphonia or aphasia. Mood and affect are appropriate.  Cranial nerves: Pupils are equal and briskly reactive to light.  Funduscopic exam without edema but palor, the patient has floaters in both eyes. Status post cataract .  Extraocular movements  in vertical and horizontal planes intact and without nystagmus. Visual fields by finger perimetry are intact. Hearing to finger rub intact.   Facial sensation intact to fine touch. Facial motor strength is symmetric and tongue and uvula move midline.  Motor exam:  Reduced strength in all extremities and  weaker grip.  Feet ROM restricted by severe edema.  Her ROM for  lower extremities is severely limited due to lymphatic edema- not a muscle disease- she is basically stiff . Sensory:  Fine touch, pinprick and vibration were tested in upper extremities.  Proprioception is tested in  the upper extremities only. This was normal.  Coordination: Rapid alternating movements in the fingers/hands is tested and normal. Finger-to-nose maneuver tested and normal without evidence of ataxia, dysmetria or tremor.  Gait and station: Patient walks with a cane for assistive device . The gait disorder and falls are not neurologically induced.  Deep tendon reflexes: in the upper and  extremities are symmetric and intact.    Assessment:  After physical and neurologic examination, review of laboratory studies, imaging, neurophysiology testing and pre-existing records, assessment is  10 Melroy's lymphedema causes gait and ROM limitation to the waist in below. [name]  2) This patient has likely iron deficiency, perhaps B deficiency ,  too. Glossitis, palor, skin changes in color and CHF.  Chronic GI blood loss ? Dr Olevia Perches follows her .  She should be able to take a prenatal vitamin with iron , bid with meals.  3) Sleep apnea is likely the only treatable condition - she would need to come in for a night attendet study. Since she is on Morphine and has CHF with  A fib - she needs CO2 and screening for central apneas   Plan:  Treatment plan and additional workup :  SPLIT study , CO2 and o2 measure , titration to 02 if AHi is less than 15.  Repeat order. EMG and NCS for the paraspinals

## 2013-08-15 DIAGNOSIS — Q820 Hereditary lymphedema: Secondary | ICD-10-CM | POA: Diagnosis not present

## 2013-08-15 DIAGNOSIS — I2510 Atherosclerotic heart disease of native coronary artery without angina pectoris: Secondary | ICD-10-CM | POA: Diagnosis not present

## 2013-08-15 DIAGNOSIS — I10 Essential (primary) hypertension: Secondary | ICD-10-CM | POA: Diagnosis not present

## 2013-08-15 DIAGNOSIS — G4700 Insomnia, unspecified: Secondary | ICD-10-CM | POA: Diagnosis not present

## 2013-08-15 DIAGNOSIS — I2789 Other specified pulmonary heart diseases: Secondary | ICD-10-CM | POA: Diagnosis not present

## 2013-08-15 DIAGNOSIS — E039 Hypothyroidism, unspecified: Secondary | ICD-10-CM | POA: Diagnosis not present

## 2013-08-15 DIAGNOSIS — I4891 Unspecified atrial fibrillation: Secondary | ICD-10-CM | POA: Diagnosis not present

## 2013-08-15 DIAGNOSIS — E785 Hyperlipidemia, unspecified: Secondary | ICD-10-CM | POA: Diagnosis not present

## 2013-09-01 ENCOUNTER — Encounter (INDEPENDENT_AMBULATORY_CARE_PROVIDER_SITE_OTHER): Payer: Self-pay

## 2013-09-01 ENCOUNTER — Ambulatory Visit (INDEPENDENT_AMBULATORY_CARE_PROVIDER_SITE_OTHER): Payer: Medicare Other | Admitting: Neurology

## 2013-09-01 DIAGNOSIS — G737 Myopathy in diseases classified elsewhere: Secondary | ICD-10-CM | POA: Diagnosis not present

## 2013-09-01 DIAGNOSIS — R29898 Other symptoms and signs involving the musculoskeletal system: Secondary | ICD-10-CM

## 2013-09-01 DIAGNOSIS — R29818 Other symptoms and signs involving the nervous system: Secondary | ICD-10-CM | POA: Diagnosis not present

## 2013-09-01 DIAGNOSIS — Z0289 Encounter for other administrative examinations: Secondary | ICD-10-CM

## 2013-09-01 NOTE — Procedures (Signed)
     HISTORY:  Victoria Fisher is a 79 year old patient with a history of a fall 2-3 months prior to this evaluation sustaining injury to the head and neck. Since that time, the patient has noted a "dropped head syndrome". She denies any weakness or discomfort down in the arms, but she does have some discomfort in the neck. She has fatigable weakness with the neck extensors, with increased difficulty keeping her head up as the day goes on. She is being evaluated for possible myopathic disorder as an explanation for the "dropped head syndrome".  NERVE CONDUCTION STUDIES:  Nerve conduction studies were performed on both upper extremities. The distal motor latencies for the median nerves were prolonged bilaterally, with a low motor amplitude on the left, normal motor amplitude on the right. The distal motor latencies for the ulnar nerves were normal bilaterally, with low motor amplitudes for these nerves bilaterally. The F wave latencies and nerve conduction velocities for the median and ulnar nerves were normal bilaterally. The sensory latencies for the median nerves were prolonged bilaterally, normal for the ulnar nerves bilaterally.  A study of the left lower extremity was attempted, no response was seen for the peroneal and posterior tibial nerves. The left peroneal sensory latency was unobtainable.  EMG STUDIES:  EMG study was performed on the left upper extremity:  The first dorsal interosseous muscle reveals 2 to 4 K units with full recruitment. No fibrillations or positive waves were noted. The abductor pollicis brevis muscle reveals 2 to 4 K units with decreased recruitment. No fibrillations or positive waves were noted. The extensor indicis proprius muscle reveals 1 to 3 K units with full recruitment. No fibrillations or positive waves were noted. The pronator teres muscle reveals 2 to 3 K units with full recruitment. No fibrillations or positive waves were noted. The biceps muscle reveals  1 to 2 K units with full recruitment. No fibrillations or positive waves were noted. The triceps muscle reveals 2 to 4 K units with full recruitment. No fibrillations or positive waves were noted. The anterior deltoid muscle reveals 2 to 3 K units with full recruitment. No fibrillations or positive waves were noted. The cervical paraspinal muscles were tested at 3 levels. 2+ fibrillations and positive waves were seen at all 3 levels tested. With muscle contraction, motor units were in the 0.2 to 0.5 millivolt range in amplitude. Full recruitment was seen. There was good relaxation.   IMPRESSION:  Nerve conduction studies done on both upper extremities and the left lower extremity shows evidence of bilateral carpal tunnel syndrome of moderate severity on the left, mild severity on the right. No response was seen for the nerves of th left lower extremity, but the patient has severe peripheral edema, and the absence of response may be technical in nature. EMG evaluation of the left upper extremity was unremarkable with exception that there was active denervation in the cervical paraspinal muscles with myopathic units seen. This study suggests the possibility of a focal cervical myositis. Other entities such as myasthenia gravis still need to be excluded.  Jill Alexanders MD 09/01/2013 11:20 AM  [facility] [address] [place], [place] [address]  Phone [phone] Fax [phone]

## 2013-09-05 DIAGNOSIS — E039 Hypothyroidism, unspecified: Secondary | ICD-10-CM | POA: Diagnosis not present

## 2013-09-05 DIAGNOSIS — R3989 Other symptoms and signs involving the genitourinary system: Secondary | ICD-10-CM | POA: Diagnosis not present

## 2013-09-05 DIAGNOSIS — Z1331 Encounter for screening for depression: Secondary | ICD-10-CM | POA: Diagnosis not present

## 2013-09-05 DIAGNOSIS — D649 Anemia, unspecified: Secondary | ICD-10-CM | POA: Diagnosis not present

## 2013-09-05 DIAGNOSIS — Z0000 Encounter for general adult medical examination without abnormal findings: Secondary | ICD-10-CM | POA: Diagnosis not present

## 2013-09-05 DIAGNOSIS — Z79899 Other long term (current) drug therapy: Secondary | ICD-10-CM | POA: Diagnosis not present

## 2013-09-05 DIAGNOSIS — K5900 Constipation, unspecified: Secondary | ICD-10-CM | POA: Diagnosis not present

## 2013-09-05 DIAGNOSIS — Z23 Encounter for immunization: Secondary | ICD-10-CM | POA: Diagnosis not present

## 2013-09-05 DIAGNOSIS — I2789 Other specified pulmonary heart diseases: Secondary | ICD-10-CM | POA: Diagnosis not present

## 2013-09-06 ENCOUNTER — Encounter: Payer: Self-pay | Admitting: Adult Health

## 2013-09-06 ENCOUNTER — Ambulatory Visit (INDEPENDENT_AMBULATORY_CARE_PROVIDER_SITE_OTHER): Payer: Medicare Other | Admitting: Adult Health

## 2013-09-06 VITALS — BP 96/54 | HR 65 | Ht 61.75 in | Wt 153.0 lb

## 2013-09-06 DIAGNOSIS — R29818 Other symptoms and signs involving the nervous system: Secondary | ICD-10-CM

## 2013-09-06 DIAGNOSIS — R29898 Other symptoms and signs involving the musculoskeletal system: Secondary | ICD-10-CM

## 2013-09-06 NOTE — Progress Notes (Signed)
PATIENT: Victoria Fisher DOB: 1934/10/30  REASON FOR VISIT: follow up HISTORY FROM: patient  HISTORY OF PRESENT ILLNESS:  Victoria Fisher is a 79 year old female with a history of dropped head syndrome and sleep disorder. She returns today for follow-up. The patient has not has her sleep study yet- that is scheduled for August 5th. The patient did have nerve conduction studies with EMG that showed active denervation in the cervical paraspinal muscles with myopathic units seen suggesting the possibility of a focal cervical myositis.  She reports that her neck has not gotten any better. Patient has pain when trying to extend the neck and very limited range of motion. Patient reports that she has some numbness in her fingers. Reports that there has been times that she could not move three of her fingers. However that is no longer an issue. Patient states that she fell at home and twisted her neck and since then she has had this problem with her neck. Patient states that she does have some difficulty swallowing. She states that she experiences pain after she swallows. She contributes this to a hiatal hernia. Patient denies double vision or drooping of the eyelids.  REVIEW OF SYSTEMS: Full 14 system review of systems performed and notable only for:  Constitutional: Appetite change, chills Eyes: N/A Ear/Nose/Throat: Trouble swallowing Skin: N/A  Cardiovascular: Chest pain, leg swelling, palpitations Respiratory: N/A  Gastrointestinal: Swollen abdomen, constipation Genitourinary: Painful urination Hematology/Lymphatic: Swollen lymph nodes, anemia Endocrine: Excessive thirst  Musculoskeletal: Neck pain, neck stiffness Allergy/Immunology: N/A  Neurological: N/A Psychiatric: N/A Sleep: Apnea, daytime sleepiness, acting out dreams  ALLERGIES: Allergies  Allergen Reactions  . Codeine Anaphylaxis, Hives and Nausea And Vomiting  . Ciprofloxacin Hives and Nausea And Vomiting  . Latex Hives and Rash   . Penicillins Hives and Rash  . Sulfonamide Derivatives Hives and Rash    HOME MEDICATIONS: Outpatient Prescriptions Prior to Visit  Medication Sig Dispense Refill  . aspirin EC 81 MG tablet Take 81 mg by mouth every evening.       [name] atorvastatin (LIPITOR) 40 MG tablet Take 40 mg by mouth daily.      . feeding supplement, ENSURE COMPLETE, (ENSURE COMPLETE) LIQD Take 237 mLs by mouth 2 (two) times daily between meals.  60 Bottle  2  . levothyroxine (SYNTHROID, LEVOTHROID) 112 MCG tablet Take 1 tablet (112 mcg total) by mouth every morning.  30 tablet  6  . lisinopril (PRINIVIL,ZESTRIL) 20 MG tablet Take 20 mg by mouth daily.      . magnesium oxide (MAG-OX) 400 (241.3 MG) MG tablet Take 1 tablet (400 mg total) by mouth daily.  30 tablet  5  . metoprolol succinate (TOPROL-XL) 25 MG 24 hr tablet Take 1 tablet (25 mg total) by mouth daily.  30 tablet  5  . morphine (MS CONTIN) 60 MG 12 hr tablet Take 60 mg by mouth every 12 (twelve) hours.      [name] morphine (MSIR) 30 MG tablet Take 30 mg by mouth daily as needed (breakthrough pain morning).       . nitroGLYCERIN (NITROLINGUAL) 0.4 MG/SPRAY spray Place 1 spray under the tongue every 5 (five) minutes as needed for chest pain.  12 g  1  . potassium chloride (K-DUR,KLOR-CON) 10 MEQ tablet Take 10 mEq by mouth daily.       [name] senna (SENOKOT) 8.6 MG tablet Take 2-3 tablets (17.2-25.8 mg total) by mouth daily.      . sertraline (ZOLOFT)  100 MG tablet Take 100 mg by mouth daily.      [name] torsemide (DEMADEX) 20 MG tablet Take 20 mg by mouth 2 (two) times daily.        No facility-administered medications prior to visit.    PAST MEDICAL HISTORY: Past Medical History  Diagnosis Date  . Anxiety   . Arthritis   . COPD (chronic obstructive pulmonary disease)   . Emphysema of lung   . Tuberculosis   . MI (myocardial infarction) 1999    Unstable angina  . Lung nodule   . Diverticulosis of colon   . Depression   . Milroy's disease     Lymphatic  disorder diagnosed at [facility]  . Chronic anticoagulation     A fib. On Coumadin.  . IBS (irritable bowel syndrome)   . Edema of both legs     Venous Doppler 05/11/12 was normal & no evidence of thrombus or thrombophlebitis. Chronic LE edema related to both cor pulmonale & lymphedema, hyperlipidemia & HTN involving coronary atherosclerosis by cardiac cath in 2004. Cath showed a tiny old occlusion of the optional diagonal branch & also proximal LAD.  [name] Chronic pain syndrome   . Hyperlipidemia     On a statin. Catheterization 02/12/03 Showed a tiny old occlusion of the optional diagonal branch & also proximal LAD.  [name] Hypertension     Myoview 07/2011 EF = >55%. RV was moderately dilated. LA was severely dilated. Catheterization 02/12/03 Showed a tiny old occlusion of the optional diagonal branch & also proximal LAD.  [name] Coronary artery disease   . Coronary atherosclerosis     Mild. Myoview 07/2011 EF = >55%. RV was moderately dilated. LA was severely dilated. Catheterization 02/12/03 Showed a tiny old occlusion of the optional diagonal branch & also proximal LAD.  [name] Chronic cor pulmonale     Most likely due to COPD (cannot rule out contribution of diastolic LV dysfunction). ECHO 07/30/11 pulmonary artery pressure was estimated to be 50-60% mmHg & there was moderate tricuspid insufficiency.  . Frequent falls     Possibly due to orthostatic hypotension but we have not seen that on our visits. Recent fall 05/2012 with scalp hematoma, swollen left LE, & black eyes.  . Obstructive sleep apnea     Polysomnogram 09/25/11 AHI: 53.6/hr overall & 57.1/hr during REM sleep. Poor compliance previously with CPAP  . Pulmonary hypertension     Pullmonary hypertension, chronic cor pulmonale w/pulmonary artery pressures of 50&#x2011; 60 mmHg.  . CHF (congestive heart failure)     Acute on chronic, diastolic, improved. Predominantly right heart failure due to cor pulmonale & in turn this is most likely due to untreated OSA.   [name] Dyspnea on exertion     Reluctant to take diuretics  . Atrial fibrillation, chronic     On Coumadin & a beta-blocker  . Cancer     Carcinoma of sigmoid colon [place].  [name] Bowel obstruction     Caused by scar tissue  . Hypothyroidism 2002    On hormone replacement therapy  . Asthma     On nebulizer  . Insomnia   . 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 Appendectomy  1956  . Abdominal hysterectomy  1983  . Colon resection      2 times  . Cholecystectomy  1984  . Colon biopsy    . Cardiac catheterization  02/12/03    Showed  a tiny old occlusion of the optional diagonal branch & also proximal LAD.  [name] Colonoscopy  07/23/10    FAMILY HISTORY: Family History  Problem Relation Age of Onset  . Heart disease Father   . Alzheimer's disease Mother     SOCIAL HISTORY: History   Social History  . Marital Status: Married    Spouse Name: Georgiann Mccoy    Number of Children: 3  . Years of Education: HS   Occupational History  .     Social History Main Topics  . Smoking status: Never Smoker   . Smokeless tobacco: Never Used  . Alcohol Use: No  . Drug Use: No  . Sexual Activity: No   Other Topics Concern  . Not on file   Social History Narrative   Patient is married Georgiann Mccoy) and lives at home with her husband.   Patient is retired.   Patient has three children and her husband has two children.   Patient is right-handed.   Patient drinks very little caffeine.   Patient has a high school education.      PHYSICAL EXAM  Filed Vitals:   09/06/13 1003  BP: 96/54  Pulse: 65  Height: 5' 1.75" (1.568 m)  Weight: 153 lb (69.4 kg)   Body mass index is 28.23 kg/(m^2).  Generalized: Well developed, in no acute distress  Head: normocephalic and atraumatic. Oropharynx benign  Neck: Limited range of motion with extension. Pain with palpation of the paraspinal muscles. Pain when trying to extend the  neck.  Skin: patient has milroy's disease therefore her legs are very edematous and some pain is associated with the swelling.  Neurological examination  Mentation: Alert oriented to time, place, history taking. Follows all commands speech and language fluent Cranial nerve II-XII: Pupils were equal round reactive to light extraocular movements were full, visual field were full on confrontational test.  Motor: The motor testing reveals 5 over 5 strength of all 4 extremities. Good symmetric motor tone is noted throughout.  Sensory: Sensory testing is intact to soft touch on all 4 extremities. No evidence of extinction is noted.  Coordination: Cerebellar testing reveals good finger-nose-finger and heel-to-shin bilaterally.  Gait and station: Patient uses a rollator when ambulating. Tandem gait not attempted.  Reflexes: Deep tendon reflexes are symmetric but depressed.  DIAGNOSTIC DATA (LABS, IMAGING, TESTING) - I reviewed patient records, labs, notes, testing and imaging myself where available.  Lab Results  Component Value Date   WBC 5.2 07/11/2013   HGB 10.1* 07/11/2013   HCT 31.5* 07/11/2013   MCV 92.6 07/11/2013   PLT 124* 07/11/2013      Component Value Date/Time   NA 135* 07/11/2013 0540   K 4.8 07/11/2013 0540   CL 93* 07/11/2013 0540   CO2 33* 07/11/2013 0540   GLUCOSE 99 07/11/2013 0540   BUN 55* 07/11/2013 0540   CREATININE 1.11* 07/11/2013 0540   CALCIUM 10.8* 07/11/2013 0540   CALCIUM 10.6* 07/10/2013 0940   PROT 6.5 07/07/2013 0154   ALBUMIN 3.1* 07/07/2013 0154   AST 27 07/07/2013 0154   ALT 12 07/07/2013 0154   ALKPHOS 89 07/07/2013 0154   BILITOT 0.8 07/07/2013 0154   GFRNONAA 47* 07/11/2013 0540   GFRAA 54* 07/11/2013 0540   Lab Results  Component Value Date   CHOL  Value: 105        ATP III CLASSIFICATION:  <200     mg/dL   Desirable  200-239  mg/dL   Borderline High  >=240  mg/dL   High        03/12/2008   HDL 35* 03/12/2008   LDLCALC  Value: 55        Total  Cholesterol/HDL:CHD Risk Coronary Heart Disease Risk Table                     Men   Women  1/2 Average Risk   3.4   3.3  Average Risk       5.0   4.4  2 X Average Risk   9.6   7.1  3 X Average Risk  23.4   11.0        Use the calculated Patient Ratio above and the CHD Risk Table to determine the patient's CHD Risk.        ATP III CLASSIFICATION (LDL):  <100     mg/dL   Optimal  100-129  mg/dL   Near or Above                    Optimal  130-159  mg/dL   Borderline  160-189  mg/dL   High  >190     mg/dL   Very High 03/12/2008   TRIG 73 03/12/2008   CHOLHDL 3.0 03/12/2008   Lab Results  Component Value Date   HGBA1C  Value: 6.1 (NOTE)   The ADA recommends the following therapeutic goal for glycemic   control related to Hgb A1C measurement:   Goal of Therapy:   < 7.0% Hgb A1C   Reference: American Diabetes Association: Clinical Practice   Recommendations 2008, Diabetes Care,  2008, 31:(Suppl 1). 03/12/2008   No results found for this basename: VITAMINB12   Lab Results  Component Value Date   TSH 21.500* 06/08/2013      ASSESSMENT AND PLAN 79 y.o. year old female  has a past medical history of Anxiety; Arthritis; COPD (chronic obstructive pulmonary disease); Emphysema of lung; Tuberculosis; MI (myocardial infarction) (1999); Lung nodule; Diverticulosis of colon; Depression; Milroy's disease; Chronic anticoagulation; IBS (irritable bowel syndrome); Edema of both legs; Chronic pain syndrome; Hyperlipidemia; Hypertension; Coronary artery disease; Coronary atherosclerosis; Chronic cor pulmonale; Frequent falls; Obstructive sleep apnea; Pulmonary hypertension; CHF (congestive heart failure); Dyspnea on exertion; Atrial fibrillation, chronic; Cancer; Bowel obstruction; Hypothyroidism (2002); Asthma; Insomnia; and Dropped head syndrome (08/03/2013). here with:  1. Dropped head syndrome  Patient continues to have drooping of the head. She states that this has not improved at all. EMG suggests a focal cervical  myositis. Dr. Jannifer Franklin was consulted regarding the plan of care for this patient. We will check blood work to look for underlying causes. I will give the patient a prescription for a [place] collar to help hold her head up. Once blood work is resulted that will determine the next step in her treatment plan. Patient should return in 3 months for follow-up or sooner if needed. She should keep her appointment on August 5th for sleep study.    Ward Givens, MSN, NP-C 09/06/2013, 10:33 AM [facility] [address], [place], [place] [address] [phone]  Note: This document was prepared with digital dictation and possible smart phrase technology. Any transcriptional errors that result from this process are unintentional.   I have seen and examined this patient. I agree with the treatment plan.  I have read the note, and I agree with the clinical assessment and plan.  Lenor Coffin

## 2013-09-06 NOTE — Patient Instructions (Signed)
We will check blood work today and call you with the results. I have given you a prescription for a cervical collar. Please don't hesitate to call our office if you have any questions or concerns.

## 2013-09-07 ENCOUNTER — Telehealth: Payer: Self-pay | Admitting: Neurology

## 2013-09-07 LAB — RHEUMATOID FACTOR: RHEUMATOID FACTOR: 18.1 [IU]/mL — AB (ref 0.0–13.9)

## 2013-09-07 LAB — SEDIMENTATION RATE: Sed Rate: 26 mm/hr (ref 0–40)

## 2013-09-07 LAB — ANGIOTENSIN CONVERTING ENZYME

## 2013-09-07 LAB — ACETYLCHOLINE RECEPTOR, BINDING: AChR Binding Ab, Serum: 0.07 nmol/L (ref 0.00–0.24)

## 2013-09-07 LAB — ANA: Anti Nuclear Antibody(ANA): NEGATIVE

## 2013-09-07 LAB — CK: Total CK: 62 U/L (ref 24–173)

## 2013-09-07 NOTE — Telephone Encounter (Signed)
Message copied by Margette Fast on Thu Sep 07, 2013  5:24 PM ------      Message from: [place]      Created: Thu Sep 07, 2013  5:12 PM                   ----- Message -----         From: [facility] Lab Results In Interface         Sent: 09/07/2013   7:46 AM           To: Ward Givens, NP             ------

## 2013-09-07 NOTE — Telephone Encounter (Signed)
I called patient. The blood work is unremarkable with exception that the rheumatoid factor was slightly elevated 18. I am not sure what the clinical significance of this is. The sedimentation rate was normal. No evidence of myasthenia gravis, CK enzyme level was normal. The discuss potential treatment for what likely is a focal cervical myelopathy. In the literature, this is not usually very responsive to medical therapy. I will give the patient a trial on prednisone at 20 mg a day if she wishes to go on this, she wants to "think about it". She will call me if she wants to get a 3 or 4 month trial on prednisone.

## 2013-09-11 DIAGNOSIS — I781 Nevus, non-neoplastic: Secondary | ICD-10-CM | POA: Diagnosis not present

## 2013-09-11 DIAGNOSIS — Z85828 Personal history of other malignant neoplasm of skin: Secondary | ICD-10-CM | POA: Diagnosis not present

## 2013-09-11 DIAGNOSIS — D239 Other benign neoplasm of skin, unspecified: Secondary | ICD-10-CM | POA: Diagnosis not present

## 2013-09-11 DIAGNOSIS — L821 Other seborrheic keratosis: Secondary | ICD-10-CM | POA: Diagnosis not present

## 2013-09-11 DIAGNOSIS — D485 Neoplasm of uncertain behavior of skin: Secondary | ICD-10-CM | POA: Diagnosis not present

## 2013-09-11 NOTE — Progress Notes (Signed)
Quick Note:  Spoke to patient and relayed NCV/EMG results showing bilateral evidence of carpal tunnel, per Dr. Brett Fairy. Patient does not want referral to hand surgeon at this time. ______

## 2013-09-12 DIAGNOSIS — D235 Other benign neoplasm of skin of trunk: Secondary | ICD-10-CM | POA: Diagnosis not present

## 2013-09-20 ENCOUNTER — Ambulatory Visit (INDEPENDENT_AMBULATORY_CARE_PROVIDER_SITE_OTHER): Payer: Medicare Other | Admitting: Neurology

## 2013-09-20 ENCOUNTER — Telehealth: Payer: Self-pay | Admitting: Neurology

## 2013-09-20 DIAGNOSIS — R0902 Hypoxemia: Secondary | ICD-10-CM

## 2013-09-20 DIAGNOSIS — R29898 Other symptoms and signs involving the musculoskeletal system: Secondary | ICD-10-CM

## 2013-09-20 DIAGNOSIS — N189 Chronic kidney disease, unspecified: Secondary | ICD-10-CM

## 2013-09-20 DIAGNOSIS — G4733 Obstructive sleep apnea (adult) (pediatric): Secondary | ICD-10-CM

## 2013-09-20 DIAGNOSIS — G4730 Sleep apnea, unspecified: Secondary | ICD-10-CM

## 2013-09-20 DIAGNOSIS — I509 Heart failure, unspecified: Secondary | ICD-10-CM

## 2013-10-03 DIAGNOSIS — H1044 Vernal conjunctivitis: Secondary | ICD-10-CM | POA: Diagnosis not present

## 2013-10-09 ENCOUNTER — Encounter: Payer: Self-pay | Admitting: Internal Medicine

## 2013-10-12 ENCOUNTER — Encounter: Payer: Self-pay | Admitting: Neurology

## 2013-10-12 NOTE — Telephone Encounter (Signed)
I called the patient to review the results of her obstructive sleep apnea.  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  Due to the findings from her sleep study, Dr. Brett Fairy has recommended that the patient return for a CPAP titration study.  A copy of this sleep study report will be sent to Dr. Domenick Gong.  The patient's sleep study test result will be mailed to her home address.  Her CPAP titration study has been scheduled for 11/07/2013.

## 2013-10-30 ENCOUNTER — Emergency Department (HOSPITAL_COMMUNITY): Payer: Medicare Other

## 2013-10-30 ENCOUNTER — Emergency Department (HOSPITAL_COMMUNITY)
Admission: EM | Admit: 2013-10-30 | Discharge: 2013-10-30 | Disposition: A | Discharge status: Home / Self Care | Payer: Medicare Other | Attending: Emergency Medicine | Admitting: Emergency Medicine

## 2013-10-30 ENCOUNTER — Encounter (HOSPITAL_COMMUNITY): Payer: Self-pay | Admitting: Emergency Medicine

## 2013-10-30 DIAGNOSIS — Y9389 Activity, other specified: Secondary | ICD-10-CM | POA: Diagnosis not present

## 2013-10-30 DIAGNOSIS — K589 Irritable bowel syndrome without diarrhea: Secondary | ICD-10-CM | POA: Insufficient documentation

## 2013-10-30 DIAGNOSIS — Z9889 Other specified postprocedural states: Secondary | ICD-10-CM | POA: Diagnosis not present

## 2013-10-30 DIAGNOSIS — F3289 Other specified depressive episodes: Secondary | ICD-10-CM | POA: Diagnosis not present

## 2013-10-30 DIAGNOSIS — F411 Generalized anxiety disorder: Secondary | ICD-10-CM | POA: Insufficient documentation

## 2013-10-30 DIAGNOSIS — N179 Acute kidney failure, unspecified: Secondary | ICD-10-CM | POA: Insufficient documentation

## 2013-10-30 DIAGNOSIS — S139XXA Sprain of joints and ligaments of unspecified parts of neck, initial encounter: Secondary | ICD-10-CM | POA: Diagnosis not present

## 2013-10-30 DIAGNOSIS — Z8611 Personal history of tuberculosis: Secondary | ICD-10-CM | POA: Insufficient documentation

## 2013-10-30 DIAGNOSIS — I252 Old myocardial infarction: Secondary | ICD-10-CM | POA: Diagnosis not present

## 2013-10-30 DIAGNOSIS — Z91040 Latex allergy status: Secondary | ICD-10-CM | POA: Diagnosis not present

## 2013-10-30 DIAGNOSIS — F329 Major depressive disorder, single episode, unspecified: Secondary | ICD-10-CM | POA: Diagnosis not present

## 2013-10-30 DIAGNOSIS — M129 Arthropathy, unspecified: Secondary | ICD-10-CM | POA: Diagnosis not present

## 2013-10-30 DIAGNOSIS — Z880 Allergy status to penicillin: Secondary | ICD-10-CM | POA: Diagnosis not present

## 2013-10-30 DIAGNOSIS — I10 Essential (primary) hypertension: Secondary | ICD-10-CM | POA: Diagnosis not present

## 2013-10-30 DIAGNOSIS — J449 Chronic obstructive pulmonary disease, unspecified: Secondary | ICD-10-CM | POA: Diagnosis not present

## 2013-10-30 DIAGNOSIS — S199XXA Unspecified injury of neck, initial encounter: Secondary | ICD-10-CM | POA: Diagnosis not present

## 2013-10-30 DIAGNOSIS — R296 Repeated falls: Secondary | ICD-10-CM | POA: Insufficient documentation

## 2013-10-30 DIAGNOSIS — G894 Chronic pain syndrome: Secondary | ICD-10-CM | POA: Diagnosis not present

## 2013-10-30 DIAGNOSIS — Z85038 Personal history of other malignant neoplasm of large intestine: Secondary | ICD-10-CM | POA: Diagnosis not present

## 2013-10-30 DIAGNOSIS — Z7982 Long term (current) use of aspirin: Secondary | ICD-10-CM | POA: Diagnosis not present

## 2013-10-30 DIAGNOSIS — J4489 Other specified chronic obstructive pulmonary disease: Secondary | ICD-10-CM | POA: Diagnosis not present

## 2013-10-30 DIAGNOSIS — I509 Heart failure, unspecified: Secondary | ICD-10-CM | POA: Diagnosis not present

## 2013-10-30 DIAGNOSIS — IMO0002 Reserved for concepts with insufficient information to code with codable children: Secondary | ICD-10-CM | POA: Diagnosis not present

## 2013-10-30 DIAGNOSIS — I2510 Atherosclerotic heart disease of native coronary artery without angina pectoris: Secondary | ICD-10-CM | POA: Insufficient documentation

## 2013-10-30 DIAGNOSIS — S0003XA Contusion of scalp, initial encounter: Secondary | ICD-10-CM | POA: Diagnosis not present

## 2013-10-30 DIAGNOSIS — S0993XA Unspecified injury of face, initial encounter: Secondary | ICD-10-CM | POA: Diagnosis not present

## 2013-10-30 DIAGNOSIS — K56609 Unspecified intestinal obstruction, unspecified as to partial versus complete obstruction: Secondary | ICD-10-CM | POA: Diagnosis not present

## 2013-10-30 DIAGNOSIS — I4891 Unspecified atrial fibrillation: Secondary | ICD-10-CM | POA: Diagnosis not present

## 2013-10-30 DIAGNOSIS — Y9289 Other specified places as the place of occurrence of the external cause: Secondary | ICD-10-CM | POA: Diagnosis not present

## 2013-10-30 DIAGNOSIS — Z7901 Long term (current) use of anticoagulants: Secondary | ICD-10-CM | POA: Diagnosis not present

## 2013-10-30 DIAGNOSIS — S0100XA Unspecified open wound of scalp, initial encounter: Secondary | ICD-10-CM | POA: Insufficient documentation

## 2013-10-30 DIAGNOSIS — E039 Hypothyroidism, unspecified: Secondary | ICD-10-CM | POA: Diagnosis not present

## 2013-10-30 DIAGNOSIS — Z79899 Other long term (current) drug therapy: Secondary | ICD-10-CM | POA: Insufficient documentation

## 2013-10-30 DIAGNOSIS — S0990XA Unspecified injury of head, initial encounter: Secondary | ICD-10-CM | POA: Diagnosis not present

## 2013-10-30 DIAGNOSIS — S1093XA Contusion of unspecified part of neck, initial encounter: Secondary | ICD-10-CM | POA: Diagnosis not present

## 2013-10-30 LAB — CBC WITH DIFFERENTIAL/PLATELET
BASOS PCT: 1 % (ref 0–1)
Basophils Absolute: 0 10*3/uL (ref 0.0–0.1)
EOS ABS: 0.4 10*3/uL (ref 0.0–0.7)
Eosinophils Relative: 6 % — ABNORMAL HIGH (ref 0–5)
HEMATOCRIT: 31.3 % — AB (ref 36.0–46.0)
HEMOGLOBIN: 10 g/dL — AB (ref 12.0–15.0)
Lymphocytes Relative: 20 % (ref 12–46)
Lymphs Abs: 1.2 10*3/uL (ref 0.7–4.0)
MCH: 31.6 pg (ref 26.0–34.0)
MCHC: 31.9 g/dL (ref 30.0–36.0)
MCV: 99.1 fL (ref 78.0–100.0)
MONO ABS: 0.6 10*3/uL (ref 0.1–1.0)
Monocytes Relative: 9 % (ref 3–12)
Neutro Abs: 3.9 10*3/uL (ref 1.7–7.7)
Neutrophils Relative %: 64 % (ref 43–77)
Platelets: 128 10*3/uL — ABNORMAL LOW (ref 150–400)
RBC: 3.16 MIL/uL — ABNORMAL LOW (ref 3.87–5.11)
RDW: 14.1 % (ref 11.5–15.5)
WBC: 6 10*3/uL (ref 4.0–10.5)

## 2013-10-30 LAB — BASIC METABOLIC PANEL
Anion gap: 12 (ref 5–15)
BUN: 75 mg/dL — ABNORMAL HIGH (ref 6–23)
CALCIUM: 10.2 mg/dL (ref 8.4–10.5)
CO2: 29 mEq/L (ref 19–32)
CREATININE: 1.6 mg/dL — AB (ref 0.50–1.10)
Chloride: 100 mEq/L (ref 96–112)
GFR, EST AFRICAN AMERICAN: 35 mL/min — AB (ref >90)
GFR, EST NON AFRICAN AMERICAN: 30 mL/min — AB (ref >90)
Glucose, Bld: 122 mg/dL — ABNORMAL HIGH (ref 70–99)
Potassium: 4.5 mEq/L (ref 3.7–5.3)
Sodium: 141 mEq/L (ref 137–147)

## 2013-10-30 LAB — URINALYSIS, ROUTINE W REFLEX MICROSCOPIC
Bilirubin Urine: NEGATIVE
GLUCOSE, UA: NEGATIVE mg/dL
Ketones, ur: NEGATIVE mg/dL
LEUKOCYTES UA: NEGATIVE
Nitrite: NEGATIVE
PH: 5.5 (ref 5.0–8.0)
Protein, ur: NEGATIVE mg/dL
Specific Gravity, Urine: 1.01 (ref 1.005–1.030)
Urobilinogen, UA: 0.2 mg/dL (ref 0.0–1.0)

## 2013-10-30 LAB — URINE MICROSCOPIC-ADD ON

## 2013-10-30 LAB — PROTIME-INR
INR: 1.28 (ref 0.00–1.49)
Prothrombin Time: 16 seconds — ABNORMAL HIGH (ref 11.6–15.2)

## 2013-10-30 MED ORDER — SODIUM CHLORIDE 0.9 % IV BOLUS (SEPSIS)
1000.0000 mL | Freq: Once | INTRAVENOUS | Status: AC
  Administered 2013-10-30: 1000 mL via INTRAVENOUS

## 2013-10-30 NOTE — ED Notes (Signed)
Per EMS, Patient is unsure exactly how she fell. Patient states she fell from standing position without using her walker. Patient reports hitting her head on the hardwood floor at 0600. Home health nurse came around 0900 and called for patient to be transported. Patient denies and N/V or LOC. Patient has a history of Hypotension and neck weakness that causes a head slump. No new neck or back pain from the fall. Patient is Alert and Oriented x4. Vitals per EMS: 79 HR, 107 CBG, 97% on RA, Patient presents with a head laceration to the posterior of her head that has clotted on its own. Patient is on Coumadin.

## 2013-10-30 NOTE — ED Notes (Signed)
Pt is very sleepy, per her family, but acting normally for her.

## 2013-10-30 NOTE — ED Provider Notes (Signed)
TIME SEEN: 11:34 AM  CHIEF COMPLAINT: Fall, head injury  HPI: Patient is a 79 year old female with history of COPD, CAD, A. fib on Coumadin, CHF, hypertension, hyperlipidemia, history of frequent falls who presents to the emergency department with a fall at home today. Patient is unsure why she fell but family feels it is because she was not using her walker. She denies that she had any chest pain, shortness of breath, palpitations or dizziness that led her fall. She does have a history of orthostatic hypotension and neck weakness that is chronic. Denies any new neck or back pain. No numbness, tingling or focal weakness. No headache. No recent fevers, cough, vomiting or diarrhea.   ROS: See HPI Constitutional: no fever  Eyes: no drainage  ENT: no runny nose   Cardiovascular:  no chest pain  Resp: no SOB  GI: no vomiting GU: no dysuria Integumentary: no rash  Allergy: no hives  Musculoskeletal: no leg swelling  Neurological: no slurred speech ROS otherwise negative  PAST MEDICAL HISTORY/PAST SURGICAL HISTORY:  Past Medical History  Diagnosis Date  . Anxiety   . Arthritis   . COPD (chronic obstructive pulmonary disease)   . Emphysema of lung   . Tuberculosis   . MI (myocardial infarction) 1999    Unstable angina  . Lung nodule   . Diverticulosis of colon   . Depression   . Milroy's disease     Lymphatic disorder diagnosed at [facility]  . Chronic anticoagulation     A fib. On Coumadin.  . IBS (irritable bowel syndrome)   . Edema of both legs     Venous Doppler 05/11/12 was normal & no evidence of thrombus or thrombophlebitis. Chronic LE edema related to both cor pulmonale & lymphedema, hyperlipidemia & HTN involving coronary atherosclerosis by cardiac cath in 2004. Cath showed a tiny old occlusion of the optional diagonal branch & also proximal LAD.  [name] Chronic pain syndrome   . Hyperlipidemia     On a statin. Catheterization 02/12/03 Showed a tiny old occlusion of the optional  diagonal branch & also proximal LAD.  [name] Hypertension     Myoview 07/2011 EF = >55%. RV was moderately dilated. LA was severely dilated. Catheterization 02/12/03 Showed a tiny old occlusion of the optional diagonal branch & also proximal LAD.  [name] Coronary artery disease   . Coronary atherosclerosis     Mild. Myoview 07/2011 EF = >55%. RV was moderately dilated. LA was severely dilated. Catheterization 02/12/03 Showed a tiny old occlusion of the optional diagonal branch & also proximal LAD.  [name] Chronic cor pulmonale     Most likely due to COPD (cannot rule out contribution of diastolic LV dysfunction). ECHO 07/30/11 pulmonary artery pressure was estimated to be 50-60% mmHg & there was moderate tricuspid insufficiency.  . Frequent falls     Possibly due to orthostatic hypotension but we have not seen that on our visits. Recent fall 05/2012 with scalp hematoma, swollen left LE, & black eyes.  . Obstructive sleep apnea     Polysomnogram 09/25/11 AHI: 53.6/hr overall & 57.1/hr during REM sleep. Poor compliance previously with CPAP  . Pulmonary hypertension     Pullmonary hypertension, chronic cor pulmonale w/pulmonary artery pressures of 50&#x2011; 60 mmHg.  . CHF (congestive heart failure)     Acute on chronic, diastolic, improved. Predominantly right heart failure due to cor pulmonale & in turn this is most likely due to untreated OSA.  [name] Dyspnea on exertion  Reluctant to take diuretics  . Atrial fibrillation, chronic     On Coumadin & a beta-blocker  . Cancer     Carcinoma of sigmoid colon [place].  [name] Bowel obstruction     Caused by scar tissue  . Hypothyroidism 2002    On hormone replacement therapy  . Asthma     On nebulizer  . Insomnia   . 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 Authorizing Provider  aspirin EC 81 MG tablet Take 81 mg by mouth every evening.     Historical Provider, MD   atorvastatin (LIPITOR) 40 MG tablet Take 40 mg by mouth daily.    Historical Provider, MD  feeding supplement, ENSURE COMPLETE, (ENSURE COMPLETE) LIQD Take 237 mLs by mouth 2 (two) times daily between meals. 07/11/13   Haywood Pao, MD  levalbuterol [facility] HFA) 45 MCG/ACT inhaler Inhale 1 puff into the lungs every 4 (four) hours as needed for wheezing.    Historical Provider, MD  levothyroxine (SYNTHROID, LEVOTHROID) 112 MCG tablet Take 1 tablet (112 mcg total) by mouth every morning. 06/30/13   Mihai Croitoru, MD  lisinopril (PRINIVIL,ZESTRIL) 20 MG tablet Take 20 mg by mouth daily.    Historical Provider, MD  magnesium oxide (MAG-OX) 400 (241.3 MG) MG tablet Take 1 tablet (400 mg total) by mouth daily. 07/11/13   Haywood Pao, MD  metoprolol succinate (TOPROL-XL) 25 MG 24 hr tablet Take 1 tablet (25 mg total) by mouth daily. 07/11/13   Haywood Pao, MD  morphine (MS CONTIN) 60 MG 12 hr tablet Take 60 mg by mouth every 12 (twelve) hours.    Historical Provider, MD  morphine (MSIR) 30 MG tablet Take 30 mg by mouth daily as needed (breakthrough pain morning).     Historical Provider, MD  nitroGLYCERIN (NITROLINGUAL) 0.4 MG/SPRAY spray Place 1 spray under the tongue every 5 (five) minutes as needed for chest pain. 12/13/12   Mihai Croitoru, MD  potassium chloride (K-DUR,KLOR-CON) 10 MEQ tablet Take 10 mEq by mouth daily.     Historical Provider, MD  senna (SENOKOT) 8.6 MG tablet Take 2-3 tablets (17.2-25.8 mg total) by mouth daily. 07/11/13   Haywood Pao, MD  sertraline (ZOLOFT) 100 MG tablet Take 100 mg by mouth daily.    Historical Provider, MD  torsemide (DEMADEX) 20 MG tablet Take 20 mg by mouth 2 (two) times daily.     Historical Provider, MD    ALLERGIES:  Allergies  Allergen Reactions  . Codeine Anaphylaxis, Hives and Nausea And Vomiting  . Ciprofloxacin Hives and Nausea And Vomiting  . Latex Hives and Rash  . Penicillins Hives and Rash  . Sulfonamide Derivatives Hives  and Rash    SOCIAL HISTORY:  History  Substance Use Topics  . Smoking status: Never Smoker   . Smokeless tobacco: Never Used  . Alcohol Use: No    FAMILY HISTORY: Family History  Problem Relation Age of Onset  . Heart disease Father   . Alzheimer's disease Mother     EXAM: BP 87/53  Pulse 75  Temp(Src) 97.5 F (36.4 C) (Oral)  Resp 12  SpO2 97% CONSTITUTIONAL: Alert and oriented and responds appropriately to questions. Well-appearing; well-nourished; GCS 15 HEAD: Normocephalic; superficial 3 cm abrasion to the posterior scalp that is hemostatic EYES: Conjunctivae clear, PERRL, EOMI ENT: normal nose; no rhinorrhea; moist mucous membranes; pharynx without lesions noted; no  dental injury;no septal hematoma NECK: Supple, no meningismus, no LAD; no midline spinal tenderness, step-off or deformity; patient neck is chronically in a flexed position CARD: RRR; S1 and S2 appreciated; no murmurs, no clicks, no rubs, no gallops RESP: Normal chest excursion without splinting or tachypnea; breath sounds clear and equal bilaterally; no wheezes, no rhonchi, no rales; chest wall stable, nontender to palpation ABD/GI: Normal bowel sounds; non-distended; soft, non-tender, no rebound, no guarding PELVIS:  stable, nontender to palpation BACK:  The back appears normal and is non-tender to palpation, there is no CVA tenderness; no midline spinal tenderness, step-off or deformity EXT: Normal ROM in all joints; non-tender to palpation; no edema; normal capillary refill; no cyanosis    SKIN: Normal color for age and race; warm NEURO: Moves all extremities equally, sensation to light touch intact diffusely, cranial nerves II through XII intact PSYCH: The patient's mood and manner are appropriate. Grooming and personal hygiene are appropriate.  MEDICAL DECISION MAKING: Patient here with a fall. Likely because she is not using her walker. We'll obtain basic labs, urine to evaluate for any organic cause  for her fall. We'll obtain a CT of her head and neck. No other sign of trauma on exam. Small laceration on her scalp does not need repair as the wound edges approximate well, it is superficial and hemostatic. Patient's tetanus is up-to-date.  ED PROGRESS: CT scan shows no acute intracranial abnormality. Patient does have mild elevation of her creatinine of 1.6. Will give IV fluids. I do not feel she needs admission for this at this time but can followup as an outpatient. Urine shows small amount of hemoglobin and few bacteria but also a few squamous cells. No other sign of infection. Discussed strict head injury return precautions and supportive care instructions. She verbalizes understanding and is comfortable with plan.    EKG Interpretation  Date/Time:  Monday October 30 2013 11:34:44 EDT Ventricular Rate:  68 PR Interval:    QRS Duration: 113 QT Interval:  576 QTC Calculation: 613 R Axis:   46 Text Interpretation:  Atrial fibrillation Incomplete right bundle branch block Low voltage, precordial leads Nonspecific T abnormalities, lateral leads Prolonged QT interval Confirmed by Naoko Diperna,  DO, Ryelan Kazee ([id number]) on 10/30/2013 12:07:10 PM           [place], DO 10/30/13 1428

## 2013-10-30 NOTE — Discharge Instructions (Signed)
Acute Kidney Injury Acute kidney injury is a disease in which there is sudden (acute) damage to the kidneys. The kidneys are 2 organs that lie on either side of the spine between the middle of the back and the front of the abdomen. The kidneys:  Remove wastes and extra water from the blood.   Produce important hormones. These help keep bones strong, regulate blood pressure, and help create red blood cells.   Balance the fluids and chemicals in the blood and tissues. A small amount of kidney damage may not cause problems, but a large amount of damage may make it difficult or impossible for the kidneys to work the way they should. Acute kidney injury may develop into long-lasting (chronic) kidney disease. It may also develop into a life-threatening disease called end-stage kidney disease. Acute kidney injury can get worse very quickly, so it should be treated right away. Early treatment may prevent other kidney diseases from developing.  CAUSES   A problem with blood flow to the kidneys. This may be caused by:   Blood loss.   Heart disease.   Severe burns.   Liver disease.  Direct damage to the kidneys. This may be caused by:  Some medicines.   A kidney infection.   Poisoning or consuming toxic substances.   A surgical wound.   A blow to the kidney area.   A problem with urine flow. This may be caused by:   Cancer.   Kidney stones.   An enlarged prostate. SYMPTOMS   Swelling (edema) of the legs, ankles, or feet.   Tiredness (lethargy).   Nausea or vomiting.   Confusion.   Problems with urination, such as:   Painful or burning feeling during urination.   Decreased urine production.   Frequent accidents in children who are potty trained.   Bloody urine.   Muscle twitches and cramps.   Shortness of breath.   Seizures.   Chest pain or pressure. Sometimes, no symptoms are present. DIAGNOSIS Acute kidney injury may be detected  and diagnosed by tests, including blood, urine, imaging, or kidney biopsy tests.  TREATMENT Treatment of acute kidney injury varies depending on the cause and severity of the kidney damage. In mild cases, no treatment may be needed. The kidneys may heal on their own. If acute kidney injury is more severe, your caregiver will treat the cause of the kidney damage, help the kidneys heal, and prevent complications from occurring. Severe cases may require a procedure to remove toxic wastes from the body (dialysis) or surgery to repair kidney damage. Surgery may involve:   Repair of a torn kidney.   Removal of an obstruction. Most of the time, you will need to stay overnight at the hospital.  HOME CARE INSTRUCTIONS:  Follow your prescribed diet.  Only take over-the-counter or prescription medicines as directed by your caregiver.  Do not take any new medicines (prescription, over-the-counter, or nutritional supplements) unless approved by your caregiver. Many medicines can worsen your kidney damage or need to have the dose adjusted.   Keep all follow-up appointments as directed by your caregiver.  Observe your condition to make sure you are healing as expected. SEEK IMMEDIATE MEDICAL CARE IF:  You are feeling ill or have severe pain in the back or side.   Your symptoms return or you have new symptoms.  You have any symptoms of end-stage kidney disease. These include:   Persistent itchiness.   Loss of appetite.   Headaches.   Abnormally dark  or light skin.  Numbness in the hands or feet.   Easy bruising.   Frequent hiccups.   Menstruation stops.   You have a fever.  You have increased urine production.  You have pain or bleeding when urinating. MAKE SURE YOU:   Understand these instructions.  Will watch your condition.  Will get help right away if you are not doing well or get worse Document Released: 08/18/2010 Document Revised: 05/30/2012 Document  Reviewed: 10/02/2011 [facility] Patient Information 2015 [place], [place]. This information is not intended to replace advice given to you by your health care provider. Make sure you discuss any questions you have with your health care provider.  Head Injury You have received a head injury. It does not appear serious at this time. Headaches and vomiting are common following head injury. It should be easy to awaken from sleeping. Sometimes it is necessary for you to stay in the emergency department for a while for observation. Sometimes admission to the hospital may be needed. After injuries such as yours, most problems occur within the first 24 hours, but side effects may occur up to 7-10 days after the injury. It is important for you to carefully monitor your condition and contact your health care provider or seek immediate medical care if there is a change in your condition. WHAT ARE THE TYPES OF HEAD INJURIES? Head injuries can be as minor as a bump. Some head injuries can be more severe. More severe head injuries include:  A jarring injury to the brain (concussion).  A bruise of the brain (contusion). This mean there is bleeding in the brain that can cause swelling.  A cracked skull (skull fracture).  Bleeding in the brain that collects, clots, and forms a bump (hematoma). WHAT CAUSES A HEAD INJURY? A serious head injury is most likely to happen to someone who is in a car wreck and is not wearing a seat belt. Other causes of major head injuries include bicycle or motorcycle accidents, sports injuries, and falls. HOW ARE HEAD INJURIES DIAGNOSED? A complete history of the event leading to the injury and your current symptoms will be helpful in diagnosing head injuries. Many times, pictures of the brain, such as CT or MRI are needed to see the extent of the injury. Often, an overnight hospital stay is necessary for observation.  WHEN SHOULD I SEEK IMMEDIATE MEDICAL CARE?  You should get help right  away if:  You have confusion or drowsiness.  You feel sick to your stomach (nauseous) or have continued, forceful vomiting.  You have dizziness or unsteadiness that is getting worse.  You have severe, continued headaches not relieved by medicine. Only take over-the-counter or prescription medicines for pain, fever, or discomfort as directed by your health care provider.  You do not have normal function of the arms or legs or are unable to walk.  You notice changes in the black spots in the center of the colored part of your eye (pupil).  You have a clear or bloody fluid coming from your nose or ears.  You have a loss of vision. During the next 24 hours after the injury, you must stay with someone who can watch you for the warning signs. This person should contact local emergency services (911 in the [place] if you have seizures, you become unconscious, or you are unable to wake up. HOW CAN I PREVENT A HEAD INJURY IN THE FUTURE? The most important factor for preventing major head injuries is avoiding motor vehicle  accidents. To minimize the potential for damage to your head, it is crucial to wear seat belts while riding in motor vehicles. Wearing helmets while bike riding and playing collision sports (like football) is also helpful. Also, avoiding dangerous activities around the house will further help reduce your risk of head injury.  WHEN CAN I RETURN TO NORMAL ACTIVITIES AND ATHLETICS? You should be reevaluated by your health care provider before returning to these activities. If you have any of the following symptoms, you should not return to activities or contact sports until 1 week after the symptoms have stopped:  Persistent headache.  Dizziness or vertigo.  Poor attention and concentration.  Confusion.  Memory problems.  Nausea or vomiting.  Fatigue or tire easily.  Irritability.  Intolerant of bright lights or loud noises.  Anxiety or depression.  Disturbed  sleep. MAKE SURE YOU:   Understand these instructions.  Will watch your condition.  Will get help right away if you are not doing well or get worse. Document Released: 02/02/2005 Document Revised: 02/07/2013 Document Reviewed: 10/10/2012 [facility] Patient Information 2015 [place], [place]. This information is not intended to replace advice given to you by your health care provider. Make sure you discuss any questions you have with your health care provider.

## 2013-11-06 DIAGNOSIS — Z23 Encounter for immunization: Secondary | ICD-10-CM | POA: Diagnosis not present

## 2013-11-06 DIAGNOSIS — E039 Hypothyroidism, unspecified: Secondary | ICD-10-CM | POA: Diagnosis not present

## 2013-11-06 DIAGNOSIS — G4700 Insomnia, unspecified: Secondary | ICD-10-CM | POA: Diagnosis not present

## 2013-11-06 DIAGNOSIS — I10 Essential (primary) hypertension: Secondary | ICD-10-CM | POA: Diagnosis not present

## 2013-11-06 DIAGNOSIS — D649 Anemia, unspecified: Secondary | ICD-10-CM | POA: Diagnosis not present

## 2013-11-06 DIAGNOSIS — I2510 Atherosclerotic heart disease of native coronary artery without angina pectoris: Secondary | ICD-10-CM | POA: Diagnosis not present

## 2013-11-06 DIAGNOSIS — J45909 Unspecified asthma, uncomplicated: Secondary | ICD-10-CM | POA: Diagnosis not present

## 2013-11-06 DIAGNOSIS — Q820 Hereditary lymphedema: Secondary | ICD-10-CM | POA: Diagnosis not present

## 2013-11-06 DIAGNOSIS — I4891 Unspecified atrial fibrillation: Secondary | ICD-10-CM | POA: Diagnosis not present

## 2013-11-06 DIAGNOSIS — I2789 Other specified pulmonary heart diseases: Secondary | ICD-10-CM | POA: Diagnosis not present

## 2013-11-21 ENCOUNTER — Ambulatory Visit: Payer: Self-pay | Admitting: Pharmacist Clinician (PhC)/ Clinical Pharmacy Specialist

## 2013-12-07 ENCOUNTER — Encounter: Payer: Self-pay | Admitting: Adult Health

## 2013-12-07 ENCOUNTER — Ambulatory Visit (INDEPENDENT_AMBULATORY_CARE_PROVIDER_SITE_OTHER): Payer: Medicare Other | Admitting: Adult Health

## 2013-12-07 VITALS — BP 78/47 | HR 74 | Ht 61.75 in | Wt 167.8 lb

## 2013-12-07 DIAGNOSIS — R0902 Hypoxemia: Secondary | ICD-10-CM

## 2013-12-07 DIAGNOSIS — G959 Disease of spinal cord, unspecified: Secondary | ICD-10-CM | POA: Diagnosis not present

## 2013-12-07 NOTE — Progress Notes (Signed)
I agree with the assessment and plan as directed by NP .The patient is known to me .   Osmany Azer, MD  

## 2013-12-07 NOTE — Progress Notes (Signed)
PATIENT: Victoria Fisher DOB: 10-11-1934  REASON FOR VISIT: follow up HISTORY FROM: patient  HISTORY OF PRESENT ILLNESS: Ms. Kaus is a 79 year old female with a history of dropped head syndrome and sleep disorder. She returns today for follow-up. 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 She had blood work that showed a mildly elevated RF. All other lab work was unremarkable. Dr. Jannifer Franklin suggested that she go on a 3-4 month trial of prednisone 20 mg daily. At that time the patient wanted to wait and that is still the case. She does not like all the side effects associated with prednisone. She does feel that her neck is stronger. She uses a soft collar every once in a while. She did have a sleep study that indicated a mild sleep apnea but did note hypoxemia. Due to her insurance she would have to try CPAP therapy first and if she failed then she could use oxygen at night. She states that she was planning to have the CPAP set up but she fell and hit her head and was told to wait till the wound healed before getting fitted for the CPAP.   HISTORY: 79 year old female with a history of dropped head syndrome and sleep disorder. She returns today for follow-up. The patient has not has her sleep study yet- that is scheduled for August 5th. The patient did have nerve conduction studies with EMG that showed active denervation in the cervical paraspinal muscles with myopathic units seen suggesting the possibility of a focal cervical myositis. She reports that her neck has not gotten any better. Patient has pain when trying to extend the neck and very limited range of motion. Patient reports that she has some numbness in her fingers. Reports that there has been times that she could not move three of her fingers. However that is no longer an issue. Patient states that she fell at home and twisted her neck and since then she has had this problem with her neck. Patient  states that she does have some difficulty swallowing. She states that she experiences pain after she swallows. She contributes this to a hiatal hernia. Patient denies double vision or drooping of the eyelids  REVIEW OF SYSTEMS: Full 14 system review of systems performed and notable only for:  Constitutional: N/A  Eyes: Eye redness Ear/Nose/Throat: N/A  Skin: N/A  Cardiovascular: N/A  Respiratory: N/A  Gastrointestinal: N/A  Genitourinary: frequency of urination, urgency Hematology/Lymphatic: N/A  Endocrine: N/A Musculoskeletal: neck pain, neck stiffness Allergy/Immunology: N/A  Neurological: N/A Psychiatric: N/A Sleep: daytime sleepiness, aching out dreams   ALLERGIES: Allergies  Allergen Reactions  . Codeine Anaphylaxis, Hives and Nausea And Vomiting  . Ciprofloxacin Hives and Nausea And Vomiting  . Latex Hives and Rash  . Penicillins Hives and Rash  . Sulfonamide Derivatives Hives and Rash    HOME MEDICATIONS: Outpatient Prescriptions Prior to Visit  Medication Sig Dispense Refill  . aspirin EC 81 MG tablet Take 81 mg by mouth every morning.       [name] atorvastatin (LIPITOR) 40 MG tablet Take 40 mg by mouth daily.      . cholecalciferol (VITAMIN D) 1000 UNITS tablet Take 1,000 Units by mouth daily.      [name] levalbuterol (XOPENEX HFA) 45 MCG/ACT inhaler Inhale 1 puff into the lungs every 4 (four) hours as needed for wheezing.      [name] levothyroxine (SYNTHROID, LEVOTHROID) 112 MCG tablet Take  1 tablet (112 mcg total) by mouth every morning.  30 tablet  6  . lisinopril (PRINIVIL,ZESTRIL) 20 MG tablet Take 20 mg by mouth daily.      . magnesium oxide (MAG-OX) 400 (241.3 MG) MG tablet Take 1 tablet (400 mg total) by mouth daily.  30 tablet  5  . metoprolol succinate (TOPROL-XL) 25 MG 24 hr tablet Take 1 tablet (25 mg total) by mouth daily.  30 tablet  5  . morphine (MS CONTIN) 60 MG 12 hr tablet Take 60 mg by mouth every 12 (twelve) hours.      [name] morphine (MSIR) 30 MG tablet Take 30  mg by mouth 3 (three) times daily as needed (breakthrough pain).       . nitroGLYCERIN (NITROLINGUAL) 0.4 MG/SPRAY spray Place 1 spray under the tongue every 5 (five) minutes as needed for chest pain.  12 g  1  . potassium chloride (K-DUR,KLOR-CON) 10 MEQ tablet Take 10 mEq by mouth daily.       . sennosides-docusate sodium (SENOKOT-S) 8.6-50 MG tablet Take 2-3 tablets by mouth daily as needed for constipation.      . sertraline (ZOLOFT) 100 MG tablet Take 100 mg by mouth daily.      [name] torsemide (DEMADEX) 20 MG tablet Take 20-40 mg by mouth 2 (two) times daily. Take 40 mg every morning and 20 mg every evening      . zolpidem (AMBIEN CR) 6.25 MG CR tablet Take 6.25 mg by mouth at bedtime.      . feeding supplement, ENSURE COMPLETE, (ENSURE COMPLETE) LIQD Take 237 mLs by mouth 2 (two) times daily between meals.  60 Bottle  2   No facility-administered medications prior to visit.    PAST MEDICAL HISTORY: Past Medical History  Diagnosis Date  . Anxiety   . Arthritis   . COPD (chronic obstructive pulmonary disease)   . Emphysema of lung   . Tuberculosis   . MI (myocardial infarction) 1999    Unstable angina  . Lung nodule   . Diverticulosis of colon   . Depression   . Milroy's disease     Lymphatic disorder [facility] at [facility]  . Chronic anticoagulation     A fib. On Coumadin.  . IBS (irritable bowel syndrome)   . Edema of both legs     Venous Doppler 05/11/12 was normal & no evidence of thrombus or thrombophlebitis. Chronic LE edema related to both cor pulmonale & lymphedema, hyperlipidemia & HTN involving coronary atherosclerosis by cardiac cath in 2004. Cath showed a tiny old occlusion of the optional diagonal branch & also proximal LAD.  [name] Chronic pain syndrome   . Hyperlipidemia     On a statin. Catheterization 02/12/03 Showed a tiny old occlusion of the optional diagonal branch & also proximal LAD.  [name] Hypertension     Myoview 07/2011 EF = >55%. RV was moderately dilated. LA was  severely dilated. Catheterization 02/12/03 Showed a tiny old occlusion of the optional diagonal branch & also proximal LAD.  [name] Coronary artery disease   . Coronary atherosclerosis     Mild. Myoview 07/2011 EF = >55%. RV was moderately dilated. LA was severely dilated. Catheterization 02/12/03 Showed a tiny old occlusion of the optional diagonal branch & also proximal LAD.  [name] Chronic cor pulmonale     Most likely due to COPD (cannot rule out contribution of diastolic LV dysfunction). ECHO 07/30/11 pulmonary artery pressure was estimated to be 50-60% mmHg & there was moderate  tricuspid insufficiency.  . Frequent falls     Possibly due to orthostatic hypotension but we have not seen that on our visits. Recent fall 05/2012 with scalp hematoma, swollen left LE, & black eyes.  . Obstructive sleep apnea     Polysomnogram 09/25/11 AHI: 53.6/hr overall & 57.1/hr during REM sleep. Poor compliance previously with CPAP  . Pulmonary hypertension     Pullmonary hypertension, chronic cor pulmonale w/pulmonary artery pressures of 50&#x2011; 60 mmHg.  . CHF (congestive heart failure)     Acute on chronic, diastolic, improved. Predominantly right heart failure due to cor pulmonale & in turn this is most likely due to untreated OSA.  [name] Dyspnea on exertion     Reluctant to take diuretics  . Atrial fibrillation, chronic     On Coumadin & a beta-blocker  . Cancer     Carcinoma of sigmoid colon [place].  [name] Bowel obstruction     Caused by scar tissue  . Hypothyroidism 2002    On hormone replacement therapy  . Asthma     On nebulizer  . Insomnia   . 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 Appendectomy  1956  . Abdominal hysterectomy  1983  . Colon resection      2 times  . Cholecystectomy  1984  . Colon biopsy    . Cardiac catheterization  02/12/03    Showed a tiny old occlusion of the optional diagonal branch & also proximal LAD.    [name] Colonoscopy  07/23/10    FAMILY HISTORY: Family History  Problem Relation Age of Onset  . Heart disease Father   . Alzheimer's disease Mother     SOCIAL HISTORY: History   Social History  . Marital Status: Married    Spouse Name: Georgiann Mccoy    Number of Children: 3  . Years of Education: HS   Occupational History  .     Social History Main Topics  . Smoking status: Never Smoker   . Smokeless tobacco: Never Used  . Alcohol Use: No  . Drug Use: No  . Sexual Activity: No   Other Topics Concern  . Not on file   Social History Narrative   Patient is married Georgiann Mccoy) and lives at home with her husband.   Patient is retired.   Patient has three children and her husband has two children.   Patient is right-handed.   Patient drinks very little caffeine.   Patient has a high school education.      PHYSICAL EXAM  Filed Vitals:   12/07/13 1143  BP: 78/47  Pulse: 74  Height: 5' 1.75" (1.568 m)  Weight: 167 lb 12.8 oz (76.114 kg)   Body mass index is 30.96 kg/(m^2).   Generalized: Well developed, in no acute distress  Head: normocephalic and atraumatic. Oropharynx benign  Neck: Limited range of motion with extension. Pain with palpation of the paraspinal muscles. Pain when trying to extend the neck.  Skin: patient has milroy's disease therefore her legs are very edematous and some pain is associated with the swelling.   Neurological examination  Mentation: Alert oriented to time, place, history taking. Follows all commands speech and language fluent  Cranial nerve II-XII: Pupils were equal round reactive to light extraocular movements were full, visual field were full on confrontational test.  Motor: The motor testing reveals 5 over 5 strength of all 4 extremities. Good symmetric motor tone is  noted throughout.  Sensory: Sensory testing is intact to soft touch on all 4 extremities. No evidence of extinction is noted.  Coordination: Cerebellar testing reveals good  finger-nose-finger and heel-to-shin bilaterally.  Gait and station: Patient uses a rollator when ambulating. Tandem gait not attempted.  Reflexes: Deep tendon reflexes are symmetric but depressed.      DIAGNOSTIC DATA (LABS, IMAGING, TESTING) - I reviewed patient records, labs, notes, testing and imaging myself where available.  Lab Results  Component Value Date   WBC 6.0 10/30/2013   HGB 10.0* 10/30/2013   HCT 31.3* 10/30/2013   MCV 99.1 10/30/2013   PLT 128* 10/30/2013      Component Value Date/Time   NA 141 10/30/2013 1145   K 4.5 10/30/2013 1145   CL 100 10/30/2013 1145   CO2 29 10/30/2013 1145   GLUCOSE 122* 10/30/2013 1145   BUN 75* 10/30/2013 1145   CREATININE 1.60* 10/30/2013 1145   CALCIUM 10.2 10/30/2013 1145   CALCIUM 10.6* 07/10/2013 0940   PROT 6.5 07/07/2013 0154   ALBUMIN 3.1* 07/07/2013 0154   AST 27 07/07/2013 0154   ALT 12 07/07/2013 0154   ALKPHOS 89 07/07/2013 0154   BILITOT 0.8 07/07/2013 0154   GFRNONAA 30* 10/30/2013 1145   GFRAA 35* 10/30/2013 1145   Lab Results  Component Value Date   CHOL  Value: 105        ATP III CLASSIFICATION:  <200     mg/dL   Desirable  200-239  mg/dL   Borderline High  >=240    mg/dL   High        03/12/2008   HDL 35* 03/12/2008   LDLCALC  Value: 55        Total Cholesterol/HDL:CHD Risk Coronary Heart Disease Risk Table                     Men   Women  1/2 Average Risk   3.4   3.3  Average Risk       5.0   4.4  2 X Average Risk   9.6   7.1  3 X Average Risk  23.4   11.0        Use the calculated Patient Ratio above and the CHD Risk Table to determine the patient's CHD Risk.        ATP III CLASSIFICATION (LDL):  <100     mg/dL   Optimal  100-129  mg/dL   Near or Above                    Optimal  130-159  mg/dL   Borderline  160-189  mg/dL   High  >190     mg/dL   Very High 03/12/2008   TRIG 73 03/12/2008   CHOLHDL 3.0 03/12/2008   Lab Results  Component Value Date   HGBA1C  Value: 6.1 (NOTE)   The ADA recommends the following therapeutic goal for  glycemic   control related to Hgb A1C measurement:   Goal of Therapy:   < 7.0% Hgb A1C   Reference: American Diabetes Association: Clinical Practice   Recommendations 2008, Diabetes Care,  2008, 31:(Suppl 1). 03/12/2008    Lab Results  Component Value Date   TSH 21.500* 06/08/2013      ASSESSMENT AND PLAN 79 y.o. year old female  has a past medical history of Anxiety; Arthritis; COPD (chronic obstructive pulmonary disease); Emphysema of lung; Tuberculosis; MI (myocardial infarction) (1999); Lung nodule; Diverticulosis  of colon; Depression; Milroy's disease; Chronic anticoagulation; IBS (irritable bowel syndrome); Edema of both legs; Chronic pain syndrome; Hyperlipidemia; Hypertension; Coronary artery disease; Coronary atherosclerosis; Chronic cor pulmonale; Frequent falls; Obstructive sleep apnea; Pulmonary hypertension; CHF (congestive heart failure); Dyspnea on exertion; Atrial fibrillation, chronic; Cancer; Bowel obstruction; Hypothyroidism (2002); Asthma; Insomnia; and Dropped head syndrome (08/03/2013). here with:  1. Focal Cervical myelopathy 2. Hypoxemia- indicated on sleep study  At this time patient does not want to try Prednisone for focal cervical myelopathy. She will continue using neck collar and doing exercises. On her sleep study it did indicate hypoxemia- she will have to try CPAP therapy first. She plans to stop by the sleep lab to schedule her split night sleep study to get the CPAP set up. She will follow-up in 6 months with Dr. Brett Fairy.    Ward Givens, MSN, NP-C 12/07/2013, 12:11 PM [facility] [address] [place], [place] [address] [phone]  Note: This document was prepared with digital dictation and possible smart phrase technology. Any transcriptional errors that result from this process are unintentional.

## 2014-01-03 ENCOUNTER — Encounter: Payer: Self-pay | Admitting: Neurology

## 2014-01-09 ENCOUNTER — Encounter: Payer: Self-pay | Admitting: Neurology

## 2014-01-10 ENCOUNTER — Telehealth: Payer: Self-pay | Admitting: Neurology

## 2014-01-10 DIAGNOSIS — G4733 Obstructive sleep apnea (adult) (pediatric): Secondary | ICD-10-CM

## 2014-01-10 NOTE — Telephone Encounter (Signed)
Due to the demand for an urgent sleep study before the end of the year for pt Victoria Fisher, Victoria Fisher; this patient's CPAP titration appointment was canceled in order to proceed with an auto-titration.  The patient has had a prolonged time since her baseline study in August and will be eager to begin some form of treatment instead of waiting for an additional period for an in-lab study.  Please place an order for auto-titration.

## 2014-02-19 ENCOUNTER — Encounter: Payer: Self-pay | Admitting: Cardiovascular Disease

## 2014-02-22 DIAGNOSIS — I10 Essential (primary) hypertension: Secondary | ICD-10-CM | POA: Diagnosis not present

## 2014-02-22 DIAGNOSIS — E785 Hyperlipidemia, unspecified: Secondary | ICD-10-CM | POA: Diagnosis not present

## 2014-02-22 DIAGNOSIS — I480 Paroxysmal atrial fibrillation: Secondary | ICD-10-CM | POA: Diagnosis not present

## 2014-02-22 DIAGNOSIS — D649 Anemia, unspecified: Secondary | ICD-10-CM | POA: Diagnosis not present

## 2014-02-22 DIAGNOSIS — Q820 Hereditary lymphedema: Secondary | ICD-10-CM | POA: Diagnosis not present

## 2014-02-22 DIAGNOSIS — E039 Hypothyroidism, unspecified: Secondary | ICD-10-CM | POA: Diagnosis not present

## 2014-02-22 DIAGNOSIS — I2510 Atherosclerotic heart disease of native coronary artery without angina pectoris: Secondary | ICD-10-CM | POA: Diagnosis not present

## 2014-02-22 DIAGNOSIS — Z1389 Encounter for screening for other disorder: Secondary | ICD-10-CM | POA: Diagnosis not present

## 2014-02-22 DIAGNOSIS — G4700 Insomnia, unspecified: Secondary | ICD-10-CM | POA: Diagnosis not present

## 2014-02-28 DIAGNOSIS — Z1231 Encounter for screening mammogram for malignant neoplasm of breast: Secondary | ICD-10-CM | POA: Diagnosis not present

## 2014-05-29 DIAGNOSIS — I272 Other secondary pulmonary hypertension: Secondary | ICD-10-CM | POA: Diagnosis not present

## 2014-05-29 DIAGNOSIS — E538 Deficiency of other specified B group vitamins: Secondary | ICD-10-CM | POA: Diagnosis not present

## 2014-05-29 DIAGNOSIS — I10 Essential (primary) hypertension: Secondary | ICD-10-CM | POA: Diagnosis not present

## 2014-05-29 DIAGNOSIS — R7989 Other specified abnormal findings of blood chemistry: Secondary | ICD-10-CM | POA: Diagnosis not present

## 2014-05-29 DIAGNOSIS — G4700 Insomnia, unspecified: Secondary | ICD-10-CM | POA: Diagnosis not present

## 2014-05-29 DIAGNOSIS — Z7901 Long term (current) use of anticoagulants: Secondary | ICD-10-CM | POA: Diagnosis not present

## 2014-05-29 DIAGNOSIS — E785 Hyperlipidemia, unspecified: Secondary | ICD-10-CM | POA: Diagnosis not present

## 2014-05-29 DIAGNOSIS — I2510 Atherosclerotic heart disease of native coronary artery without angina pectoris: Secondary | ICD-10-CM | POA: Diagnosis not present

## 2014-05-29 DIAGNOSIS — E039 Hypothyroidism, unspecified: Secondary | ICD-10-CM | POA: Diagnosis not present

## 2014-05-29 DIAGNOSIS — D649 Anemia, unspecified: Secondary | ICD-10-CM | POA: Diagnosis not present

## 2014-05-29 DIAGNOSIS — Q820 Hereditary lymphedema: Secondary | ICD-10-CM | POA: Diagnosis not present

## 2014-05-30 ENCOUNTER — Ambulatory Visit: Payer: Medicare Other | Admitting: Neurology

## 2014-06-06 DIAGNOSIS — E538 Deficiency of other specified B group vitamins: Secondary | ICD-10-CM | POA: Diagnosis not present

## 2014-06-13 DIAGNOSIS — D649 Anemia, unspecified: Secondary | ICD-10-CM | POA: Diagnosis not present

## 2014-06-19 ENCOUNTER — Telehealth: Payer: Self-pay

## 2014-06-19 NOTE — Telephone Encounter (Signed)
Advised pt and husband to bring cpap machine to appt tomorrow. Pt and husband verbalized understanding.

## 2014-06-20 ENCOUNTER — Ambulatory Visit (INDEPENDENT_AMBULATORY_CARE_PROVIDER_SITE_OTHER): Payer: Medicare Other | Admitting: Neurology

## 2014-06-20 ENCOUNTER — Encounter: Payer: Self-pay | Admitting: Neurology

## 2014-06-20 VITALS — BP 90/54 | HR 88 | Resp 20 | Ht 62.6 in | Wt 145.0 lb

## 2014-06-20 DIAGNOSIS — E538 Deficiency of other specified B group vitamins: Secondary | ICD-10-CM | POA: Diagnosis not present

## 2014-06-20 DIAGNOSIS — I10 Essential (primary) hypertension: Secondary | ICD-10-CM | POA: Diagnosis not present

## 2014-06-20 DIAGNOSIS — I517 Cardiomegaly: Secondary | ICD-10-CM | POA: Diagnosis not present

## 2014-06-20 DIAGNOSIS — S2231XA Fracture of one rib, right side, initial encounter for closed fracture: Secondary | ICD-10-CM | POA: Diagnosis not present

## 2014-06-20 DIAGNOSIS — Z6823 Body mass index (BMI) 23.0-23.9, adult: Secondary | ICD-10-CM | POA: Diagnosis not present

## 2014-06-20 DIAGNOSIS — R0902 Hypoxemia: Secondary | ICD-10-CM

## 2014-06-20 DIAGNOSIS — R0789 Other chest pain: Secondary | ICD-10-CM | POA: Diagnosis not present

## 2014-06-20 DIAGNOSIS — S3992XA Unspecified injury of lower back, initial encounter: Secondary | ICD-10-CM | POA: Diagnosis not present

## 2014-06-20 DIAGNOSIS — R296 Repeated falls: Secondary | ICD-10-CM | POA: Diagnosis not present

## 2014-06-20 DIAGNOSIS — M40204 Unspecified kyphosis, thoracic region: Secondary | ICD-10-CM | POA: Diagnosis not present

## 2014-06-20 DIAGNOSIS — Z91199 Patient's noncompliance with other medical treatment and regimen due to unspecified reason: Secondary | ICD-10-CM

## 2014-06-20 DIAGNOSIS — Z9119 Patient's noncompliance with other medical treatment and regimen: Secondary | ICD-10-CM

## 2014-06-20 DIAGNOSIS — Z85038 Personal history of other malignant neoplasm of large intestine: Secondary | ICD-10-CM | POA: Diagnosis not present

## 2014-06-20 NOTE — Progress Notes (Signed)
PATIENT: Victoria Fisher DOB: [birth year]  REASON FOR VISIT: follow up HISTORY FROM: patient  HISTORY OF PRESENT ILLNESS: Victoria Fisher is a 80 year old female , a patient of dr Jannifer Franklin. She was referred to me for sleep . She has a history of dropped head syndrome and sleep disorder. She returns today for follow-up. 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 She had blood work that showed a mildly elevated RF. All other lab work was unremarkable. Dr. Jannifer Franklin suggested that she go on a 3-4 month trial of prednisone 20 mg daily.  At that time the patient wanted to wait and that is still the case. She does not like all the side effects associated with prednisone. She does feel that her neck is stronger. She uses a soft collar every once in a while. She did have a sleep study that indicated a mild sleep apnea but did note hypoxemia.  Due to her insurance she would have to try CPAP therapy first and if she failed then she could use oxygen at night. She states that she was planning to have the CPAP set up but she fell and hit her head and was told to wait till the wound healed before getting fitted for the CPAP.  I have no data available that the CPAP "controversial to have helped actually her download shows no data collected today and I cannot prescribe oxygen without documenting a trial of CPAP. Her Epworth sleepiness score is endorsed at 22 out of 24 points the fatigue severity score was not endorsed, neither was a depression score. She has significant bilateral ankle edema and has a history of congestive heart failure. Based on her cardiac comorbidity I would consider just prescribing oxygen overnight at 25 m but this documentation may have to come through her cardiologist or her PCP. She is still not willing to undergo prednisone trial , and she is passive- she doesn't pursue the health problems diagnostic or therapeutic options. She is deemed non compliant for  CPAP. Dr Osborne Casco has ordered a hard collar, but was not informed about the fall  .  HISTORY: 80 year old female with a history of dropped head syndrome and sleep disorder. She returns today for follow-up. The patient has not has her sleep study yet- that is scheduled for August 5th. The patient did have nerve conduction studies with EMG that showed active denervation in the cervical paraspinal muscles with myopathic units seen suggesting the possibility of a focal cervical myositis. She reports that her neck has not gotten any better. Patient has pain when trying to extend the neck and very limited range of motion. Patient reports that she has some numbness in her fingers. Reports that there has been times that she could not move three of her fingers. However that is no longer an issue. Patient states that she fell at home and twisted her neck and since then she has had this problem with her neck. Patient states that she does have some difficulty swallowing. She states that she experiences pain after she swallows. She contributes this to a hiatal hernia. Patient denies double vision or drooping of the eyelids  REVIEW OF SYSTEMS: Full 14 system review of systems performed and notable only for:  Eyes: Eye redness Genitourinary: frequency of urination, urgency Musculoskeletal: neck pain, neck stiffness Sleep: daytime sleepiness, aching out dreams   ALLERGIES: Allergies  Allergen Reactions  . Codeine Anaphylaxis, Hives and Nausea And Vomiting  .  Ciprofloxacin Hives and Nausea And Vomiting  . Latex Hives and Rash  . Penicillins Hives and Rash  . Sulfonamide Derivatives Hives and Rash    HOME MEDICATIONS: Outpatient Prescriptions Prior to Visit  Medication Sig Dispense Refill  . aspirin EC 81 MG tablet Take 81 mg by mouth every morning.     [name] atorvastatin (LIPITOR) 40 MG tablet Take 40 mg by mouth daily.    . cholecalciferol (VITAMIN D) 1000 UNITS tablet Take 1,000 Units by mouth daily.    [name]  levalbuterol (XOPENEX HFA) 45 MCG/ACT inhaler Inhale 1 puff into the lungs every 4 (four) hours as needed for wheezing.    [name] levothyroxine (SYNTHROID, LEVOTHROID) 112 MCG tablet Take 1 tablet (112 mcg total) by mouth every morning. 30 tablet 6  . lisinopril (PRINIVIL,ZESTRIL) 20 MG tablet Take 20 mg by mouth daily.    . magnesium oxide (MAG-OX) 400 (241.3 MG) MG tablet Take 1 tablet (400 mg total) by mouth daily. 30 tablet 5  . metoprolol succinate (TOPROL-XL) 25 MG 24 hr tablet Take 1 tablet (25 mg total) by mouth daily. 30 tablet 5  . morphine (MS CONTIN) 60 MG 12 hr tablet Take 60 mg by mouth every 12 (twelve) hours.    [name] morphine (MSIR) 30 MG tablet Take 30 mg by mouth 3 (three) times daily as needed (breakthrough pain).     . nitroGLYCERIN (NITROLINGUAL) 0.4 MG/SPRAY spray Place 1 spray under the tongue every 5 (five) minutes as needed for chest pain. 12 g 1  . potassium chloride (K-DUR,KLOR-CON) 10 MEQ tablet Take 10 mEq by mouth daily.     . sennosides-docusate sodium (SENOKOT-S) 8.6-50 MG tablet Take 2-3 tablets by mouth daily as needed for constipation.    . sertraline (ZOLOFT) 100 MG tablet Take 100 mg by mouth daily.    [name] torsemide (DEMADEX) 20 MG tablet Take 20-40 mg by mouth 2 (two) times daily. Take 40 mg every morning and 20 mg every evening    . zolpidem (AMBIEN CR) 6.25 MG CR tablet Take 6.25 mg by mouth at bedtime.     No facility-administered medications prior to visit.    PAST MEDICAL HISTORY: Past Medical History  Diagnosis Date  . Anxiety   . Arthritis   . COPD (chronic obstructive pulmonary disease)   . Emphysema of lung   . Tuberculosis   . MI (myocardial infarction) 1999    Unstable angina  . Lung nodule   . Diverticulosis of colon   . Depression   . Milroy's disease     Lymphatic disorder diagnosed at [facility]  . Chronic anticoagulation     A fib. On Coumadin.  . IBS (irritable bowel syndrome)   . Edema of both legs     Venous Doppler 05/11/12 was normal & no  evidence of thrombus or thrombophlebitis. Chronic LE edema related to both cor pulmonale & lymphedema, hyperlipidemia & HTN involving coronary atherosclerosis by cardiac cath in 2004. Cath showed a tiny old occlusion of the optional diagonal branch & also proximal LAD.  [name] Chronic pain syndrome   . Hyperlipidemia     On a statin. Catheterization 02/12/03 Showed a tiny old occlusion of the optional diagonal branch & also proximal LAD.  [name] Hypertension     Myoview 07/2011 EF = >55%. RV was moderately dilated. LA was severely dilated. Catheterization 02/12/03 Showed a tiny old occlusion of the optional diagonal branch & also proximal LAD.  [name] Coronary artery disease   . Coronary  atherosclerosis     Mild. Myoview 07/2011 EF = >55%. RV was moderately dilated. LA was severely dilated. Catheterization 02/12/03 Showed a tiny old occlusion of the optional diagonal branch & also proximal LAD.  [name] Chronic cor pulmonale     Most likely due to COPD (cannot rule out contribution of diastolic LV dysfunction). ECHO 07/30/11 pulmonary artery pressure was estimated to be 50-60% mmHg & there was moderate tricuspid insufficiency.  . Frequent falls     Possibly due to orthostatic hypotension but we have not seen that on our visits. Recent fall 05/2012 with scalp hematoma, swollen left LE, & black eyes.  . Obstructive sleep apnea     Polysomnogram 09/25/11 AHI: 53.6/hr overall & 57.1/hr during REM sleep. Poor compliance previously with CPAP  . Pulmonary hypertension     Pullmonary hypertension, chronic cor pulmonale w/pulmonary artery pressures of 50&#x2011; 60 mmHg.  . CHF (congestive heart failure)     Acute on chronic, diastolic, improved. Predominantly right heart failure due to cor pulmonale & in turn this is most likely due to untreated OSA.  [name] Dyspnea on exertion     Reluctant to take diuretics  . Atrial fibrillation, chronic     On Coumadin & a beta-blocker  . Cancer     Carcinoma of sigmoid colon Friend[place].    [name] Bowel obstruction     Caused by scar tissue  . Hypothyroidism 2002    On hormone replacement therapy  . Asthma     On nebulizer  . Insomnia   . 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 Appendectomy  1956  . Abdominal hysterectomy  1983  . Colon resection      2 times  . Cholecystectomy  1984  . Colon biopsy    . Cardiac catheterization  02/12/03    Showed a tiny old occlusion of the optional diagonal branch & also proximal LAD.  [name] Colonoscopy  07/23/10    FAMILY HISTORY: Family History  Problem Relation Age of Onset  . Heart disease Father   . Alzheimer's disease Mother     SOCIAL HISTORY: History   Social History  . Marital Status: Married    Spouse Name: Georgiann Mccoy  . Number of Children: 3  . Years of Education: HS   Occupational History  .     Social History Main Topics  . Smoking status: Never Smoker   . Smokeless tobacco: Never Used  . Alcohol Use: No  . Drug Use: No  . Sexual Activity: No   Other Topics Concern  . Not on file   Social History Narrative   Patient is married Georgiann Mccoy) and lives at home with her husband.   Patient is retired.   Patient has three children and her husband has two children.   Patient is right-handed.   Patient drinks very little caffeine.   Patient has a high school education.      PHYSICAL EXAM  Filed Vitals:   06/20/14 0938  BP: 90/54  Pulse: 88  Resp: 20  Height: 5' 2.6" (1.59 m)  Weight: 145 lb (65.772 kg)   Body mass index is 26.02 kg/(m^2).   Generalized: Well developed, in no acute distress  Mallampati 2, her neck airway is certainly impaired by the position by the weakness of neck extension. She has normal nasal airflow she is not short of breath per se. Significant ankle edema actually her lower extremity  edema goes all the way to the groin her hands and fingers are not swollen and she does not have any particular rash. No  fasciculation of the tongue. No TMJ. No retrognathia. Head: normocephalic and atraumatic. Oropharynx benign  Neck: Limited range of motion with extension the patient developed a Hunch-back . Pain with palpation of the paraspinal muscles.  Pain when trying to extend the neck.  Skin: patient has milroy's disease therefore her legs are very edematous and some pain is associated with the swelling.   Neurological examination  Mentation: Alert oriented to time, place, history taking. Follows all commands speech and language fluent  Cranial nerve Pupils were equal round reactive to light,  extraocular movements were full, visual field were full on confrontational test.  Motor: The motor testing reveals 5 / 5 strength of all 4 extremities. Good symmetric motor tone is noted throughout.  Sensory: Sensory testing is intact to soft touch on all 4 extremities. No evidence of extinction is noted.  Coordination: Cerebellar testing reveals good finger-nose-finger bilaterally.  Gait and station: Patient uses a rollator when ambulating. Reflexes: Deep tendon reflexes are depressed.      DIAGNOSTIC DATA (LABS, IMAGING, TESTING) - I reviewed patient records, labs, notes, testing and imaging myself where available.  Lab Results  Component Value Date   WBC 6.0 10/30/2013   HGB 10.0* 10/30/2013   HCT 31.3* 10/30/2013   MCV 99.1 10/30/2013   PLT 128* 10/30/2013      Component Value Date/Time   NA 141 10/30/2013 1145   K 4.5 10/30/2013 1145   CL 100 10/30/2013 1145   CO2 29 10/30/2013 1145   GLUCOSE 122* 10/30/2013 1145   BUN 75* 10/30/2013 1145   CREATININE 1.60* 10/30/2013 1145   CALCIUM 10.2 10/30/2013 1145   CALCIUM 10.6* 07/10/2013 0940   PROT 6.5 07/07/2013 0154   ALBUMIN 3.1* 07/07/2013 0154   AST 27 07/07/2013 0154   ALT 12 07/07/2013 0154   ALKPHOS 89 07/07/2013 0154   BILITOT 0.8 07/07/2013 0154   GFRNONAA 30* 10/30/2013 1145   GFRAA 35* 10/30/2013 1145   Lab Results  Component  Value Date   CHOL  03/12/2008    105        ATP III CLASSIFICATION:  <200     mg/dL   Desirable  200-239  mg/dL   Borderline High  >=240    mg/dL   High          HDL 35* 03/12/2008   LDLCALC  03/12/2008    55        Total Cholesterol/HDL:CHD Risk Coronary Heart Disease Risk Table                     Men   Women  1/2 Average Risk   3.4   3.3  Average Risk       5.0   4.4  2 X Average Risk   9.6   7.1  3 X Average Risk  23.4   11.0        Use the calculated Patient Ratio above and the CHD Risk Table to determine the patient's CHD Risk.        ATP III CLASSIFICATION (LDL):  <100     mg/dL   Optimal  100-129  mg/dL   Near or Above                    Optimal  130-159  mg/dL   Borderline  160-189  mg/dL   High  >190     mg/dL   Very High   TRIG 73 03/12/2008   CHOLHDL 3.0 03/12/2008   Lab Results  Component Value Date   HGBA1C  03/12/2008    6.1 (NOTE)   The ADA recommends the following therapeutic goal for glycemic   control related to Hgb A1C measurement:   Goal of Therapy:   < 7.0% Hgb A1C   Reference: American Diabetes Association: Clinical Practice   Recommendations 2008, Diabetes Care,  2008, 31:(Suppl 1).    Lab Results  Component Value Date   TSH 21.500* 06/08/2013      ASSESSMENT AND PLAN  1. Focal Cervical myelopathy - follow up with dr Jannifer Franklin. That problem is not in the scope of the sleep clinic.  2. Hypoxemia- indicated on sleep study, related not to primary apnea but to neck ROM restriction and CHF,  3. Fall , backwards , chest pain and breast pain  since fall. She fell on her left buttock. Dr Osborne Casco was not informed. He sees her today for a B 12 shock.   The  patient still does not want to try Prednisone for focal cervical myelopathy.  She discontinued using neck collar and doing exercises. She should l follow with physical therapy , gait stabilization.   Her 2015  sleep study did  indicate hypoxemia- she was  to try CPAP therapy first. CPAP was  issued after her sleep study from 09-20-13 but since January  the patient has not used CPAP - she had a PSG in 2013 at an outside lab with an AHI of 53.6 and a REM AHI of 57 and said she was not compliant with CPAP use and as she was not given any instructions. By Medicare criteria she is now noncompliant again and they will not considered to pay for CPAP any further after 2 attempts.  Her supine AHI was actually rather mild age in our sleep study at 6.2 the RDI was 13.6 but the patient had significant oxygen desaturations. I think it may have to do with the position of her neck as well and now with her reported chest pain after a fall I would try to have her treated with a physical rehabilitation specialist for the neck pain also to decrease her fall risk. She has significant edema in both feet very puffy and oxygen therapy may be of help if the patient's hypoxemia is her main sleep difficulty. She is excessively daytime sleepy but that is not alone related to her apnea that is likely much likely are related to her overall medical conditions. She is also still on morphine 150 mg total divided doses.  She plans to stop  the CPAP therapy, and by now there is no medicare coverage for her .   She cannot follow-up in the sleep clinic , due to non compliance. ,  May see Dr Jannifer Franklin.    06/20/2014, 9:44 AM [facility] [address], [place], [place] [address] [phone]  Note: This document was prepared with digital dictation and possible smart phrase technology. Any transcriptional errors that result from this process are unintentional.

## 2014-07-31 DIAGNOSIS — E538 Deficiency of other specified B group vitamins: Secondary | ICD-10-CM | POA: Diagnosis not present

## 2014-09-14 ENCOUNTER — Telehealth: Payer: Self-pay | Admitting: Neurology

## 2014-09-14 NOTE — Telephone Encounter (Signed)
I called Victoria Fisher. Appointment scheduled for 8/15 at 12 PM.

## 2014-09-14 NOTE — Telephone Encounter (Signed)
Please call [facility] to reschedule appt, needs specific times to bring her 10am-12Noon

## 2014-09-25 ENCOUNTER — Ambulatory Visit: Payer: Self-pay | Admitting: Neurology

## 2014-09-29 IMAGING — CR DG CHEST 1V
1 series · 1 of 1 positions shown · non-contrast
Comparison: Chest x-ray 06/08/2013.

CLINICAL DATA: Hypotension.  History of fall.

EXAM:
CHEST - 1 VIEW

[view not recorded]
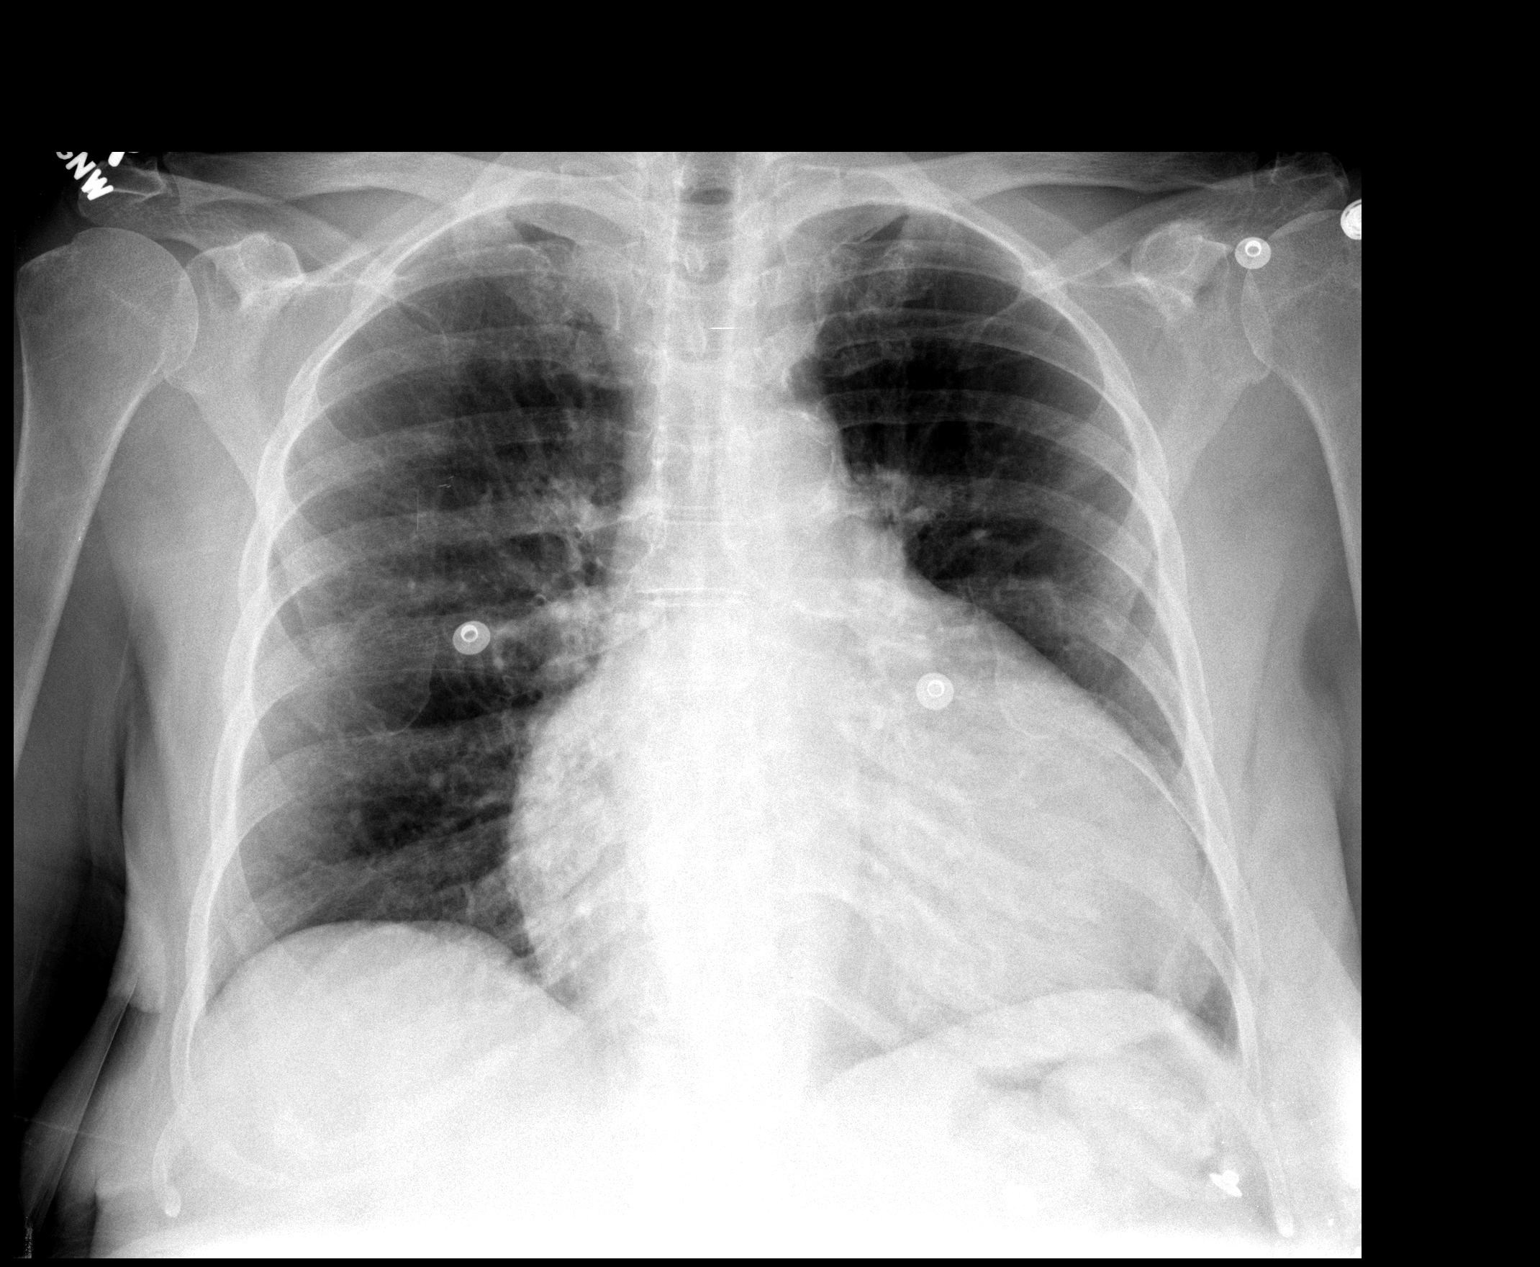

[1 of 1 positions shown; findings below may reference images not displayed]

FINDINGS: No acute consolidative airspace disease. No pleural effusions. No
pneumothorax. No evidence of pulmonary edema. Heart size is mildly
enlarged (unchanged). Upper mediastinal contours are unremarkable.
Atherosclerosis in the thoracic aorta.
IMPRESSION: 1. No radiographic evidence of acute cardiopulmonary disease.
2. Mild cardiomegaly.
3. Atherosclerosis.

## 2014-10-01 ENCOUNTER — Encounter: Payer: Self-pay | Admitting: Neurology

## 2014-10-01 ENCOUNTER — Ambulatory Visit (INDEPENDENT_AMBULATORY_CARE_PROVIDER_SITE_OTHER): Payer: Medicare Other | Admitting: Neurology

## 2014-10-01 VITALS — BP 92/58 | HR 68 | Ht 65.0 in | Wt 140.0 lb

## 2014-10-01 DIAGNOSIS — R296 Repeated falls: Secondary | ICD-10-CM | POA: Diagnosis not present

## 2014-10-01 DIAGNOSIS — R29898 Other symptoms and signs involving the musculoskeletal system: Secondary | ICD-10-CM | POA: Diagnosis not present

## 2014-10-01 NOTE — Progress Notes (Signed)
Reason for visit: Dropped head syndrome  Victoria Fisher is a 80 y.o. female  History of present illness:  Victoria Fisher is a 80 year old right-handed white female with a history of a gait disorder that dates back several years. The patient apparently fell about a year and a half ago, and injured her neck. The patient developed relatively sudden onset of a dropped head type syndrome that has not improved since that time. The patient has undergone MRI and CT evaluation of the cervical spine that does not show any severe structural changes that occurred following the fall. The patient has undergone EMG nerve conduction study that suggests a cervical myopathy, but a trial on prednisone was not helpful. Blood work showed a minimal elevation the rheumatoid factor, otherwise the studies were unremarkable. The patient had been followed by Dr. Brett Fisher from this practice, but she has apparently has transferred care to me at this point. The patient has never undergone physical therapy for the neck. She was referred to physical therapy, but this apparently never transpired. The patient indicates that she is unable to get her head into the neutral position even while lying down. The head is flexed and turned towards the left. She denies any weakness or numbness of the extremities, but she is having increasing problems with getting up out of a chair, she uses a walker for ambulation, and this has improved her gait instability, she has not had any recent falls while using the walker. She did fall 2 weeks ago, but she was not using her walker at that time. She has some urinary urgency, no incontinence. She is sent to this office for an evaluation.  Past Medical History  Diagnosis Date  . Anxiety   . Arthritis   . COPD (chronic obstructive pulmonary disease)   . Emphysema of lung   . Tuberculosis   . MI (myocardial infarction) 1999    Unstable angina  . Lung nodule   . Diverticulosis of colon   . Depression    . Milroy's disease     Lymphatic disorder diagnosed at [facility]  . Chronic anticoagulation     A fib. On Coumadin.  . IBS (irritable bowel syndrome)   . Edema of both legs     Venous Doppler 05/11/12 was normal & no evidence of thrombus or thrombophlebitis. Chronic LE edema related to both cor pulmonale & lymphedema, hyperlipidemia & HTN involving coronary atherosclerosis by cardiac cath in 2004. Cath showed a tiny old occlusion of the optional diagonal branch & also proximal LAD.  [name] Chronic pain syndrome   . Hyperlipidemia     On a statin. Catheterization 02/12/03 Showed a tiny old occlusion of the optional diagonal branch & also proximal LAD.  [name] Hypertension     Myoview 07/2011 EF = >55%. RV was moderately dilated. LA was severely dilated. Catheterization 02/12/03 Showed a tiny old occlusion of the optional diagonal branch & also proximal LAD.  [name] Coronary artery disease   . Coronary atherosclerosis     Mild. Myoview 07/2011 EF = >55%. RV was moderately dilated. LA was severely dilated. Catheterization 02/12/03 Showed a tiny old occlusion of the optional diagonal branch & also proximal LAD.  [name] Chronic cor pulmonale     Most likely due to COPD (cannot rule out contribution of diastolic LV dysfunction). ECHO 07/30/11 pulmonary artery pressure was estimated to be 50-60% mmHg & there was moderate tricuspid insufficiency.  . Frequent falls     Possibly due to orthostatic  hypotension but we have not seen that on our visits. Recent fall 05/2012 with scalp hematoma, swollen left LE, & black eyes.  . Obstructive sleep apnea     Polysomnogram 09/25/11 AHI: 53.6/hr overall & 57.1/hr during REM sleep. Poor compliance previously with CPAP  . Pulmonary hypertension     Pullmonary hypertension, chronic cor pulmonale w/pulmonary artery pressures of 50&#x2011; 60 mmHg.  . CHF (congestive heart failure)     Acute on chronic, diastolic, improved. Predominantly right heart failure due to cor pulmonale & in turn this  is most likely due to untreated OSA.  [name] Dyspnea on exertion     Reluctant to take diuretics  . Atrial fibrillation, chronic     On Coumadin & a beta-blocker  . Cancer     Carcinoma of sigmoid colon [place].  [name] Bowel obstruction     Caused by scar tissue  . Hypothyroidism 2002    On hormone replacement therapy  . Asthma     On nebulizer  . Insomnia   . 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 Appendectomy  1956  . Abdominal hysterectomy  1983  . Colon resection      2 times  . Cholecystectomy  1984  . Colon biopsy    . Cardiac catheterization  02/12/03    Showed a tiny old occlusion of the optional diagonal branch & also proximal LAD.  [name] Colonoscopy  07/23/10    Family History  Problem Relation Age of Onset  . Heart disease Father   . Alzheimer's disease Mother   . Parkinsonism Brother   . Diabetes Brother     Social history:  reports that she has never smoked. She has never used smokeless tobacco. She reports that she does not drink alcohol or use illicit drugs.  Medications:  Prior to Admission medications   Medication Sig Start Date End Date Taking? Authorizing Provider  aspirin EC 81 MG tablet Take 81 mg by mouth every morning.    Yes Historical Provider, MD  atorvastatin (LIPITOR) 40 MG tablet Take 40 mg by mouth daily.   Yes Historical Provider, MD  cholecalciferol (VITAMIN D) 1000 UNITS tablet Take 1,000 Units by mouth daily.   Yes Historical Provider, MD  levalbuterol (XOPENEX HFA) 45 MCG/ACT inhaler Inhale 1 puff into the lungs every 4 (four) hours as needed for wheezing.   Yes Historical Provider, MD  levothyroxine (SYNTHROID, LEVOTHROID) 112 MCG tablet Take 1 tablet (112 mcg total) by mouth every morning. 06/30/13  Yes Mihai Croitoru, MD  lisinopril (PRINIVIL,ZESTRIL) 20 MG tablet Take 20 mg by mouth daily.   Yes Historical Provider, MD  magnesium oxide (MAG-OX) 400 (241.3 MG) MG tablet Take 1 tablet (400 mg  total) by mouth daily. 07/11/13  Yes Rich Tisovec, MD  metoprolol succinate (TOPROL-XL) 25 MG 24 hr tablet Take 1 tablet (25 mg total) by mouth daily. 07/11/13  Yes Rich Tisovec, MD  morphine (MS CONTIN) 60 MG 12 hr tablet Take 60 mg by mouth every 12 (twelve) hours.   Yes Historical Provider, MD  morphine (MSIR) 30 MG tablet Take 30 mg by mouth 3 (three) times daily as needed (breakthrough pain).    Yes Historical Provider, MD  nitroGLYCERIN (NITROLINGUAL) 0.4 MG/SPRAY spray Place 1 spray under the tongue every 5 (five) minutes as needed for chest pain. 12/13/12  Yes Mihai Croitoru, MD  potassium chloride (K-DUR,KLOR-CON) 10 MEQ tablet Take 10 mEq by mouth daily.  Yes Historical Provider, MD  sennosides-docusate sodium (SENOKOT-S) 8.6-50 MG tablet Take 2-3 tablets by mouth daily as needed for constipation.   Yes Historical Provider, MD  sertraline (ZOLOFT) 100 MG tablet Take 100 mg by mouth daily.   Yes Historical Provider, MD  torsemide (DEMADEX) 20 MG tablet Take 20-40 mg by mouth 2 (two) times daily. Take 40 mg every morning and 20 mg every evening   Yes Historical Provider, MD  zolpidem (AMBIEN CR) 6.25 MG CR tablet Take 6.25 mg by mouth at bedtime.   Yes Historical Provider, MD      Allergies  Allergen Reactions  . Codeine Anaphylaxis, Hives and Nausea And Vomiting  . Ciprofloxacin Hives and Nausea And Vomiting  . Latex Hives and Rash  . Penicillins Hives and Rash  . Sulfonamide Derivatives Hives and Rash    ROS:  Out of a complete 14 system review of symptoms, the patient complains only of the following symptoms, and all other reviewed systems are negative.  Decreased activity Loss of vision Leg swelling, palpitations of the heart Swollen abdomen, constipation Neck pain, neck stiffness Dizziness  Blood pressure 92/58, pulse 68, height 5\' 5"  (1.651 m), weight 140 lb (63.504 kg).  Physical Exam  General: The patient is alert and cooperative at the time of the  examination.  Eyes: Pupils are equal, round, and reactive to light. Discs are flat bilaterally.  Neck: The neck is supple, no carotid bruits are noted.  Respiratory: The respiratory examination is clear.  Cardiovascular: The cardiovascular examination reveals a regular rate and rhythm, no obvious murmurs or rubs are noted.  Neuromuscular: The neck is in flexion, head tilted to the left. Unable to fully rotate the head to the left and right.  Skin: Extremities are with 4+ edema below the knees.  Neurologic Exam  Mental status: The patient is alert and oriented x 3 at the time of the examination. The patient has apparent normal recent and remote memory, with an apparently normal attention span and concentration ability.  Cranial nerves: Facial symmetry is present. There is good sensation of the face to pinprick and soft touch bilaterally. The strength of the facial muscles and the muscles to head turning and shoulder shrug are normal bilaterally. Speech is well enunciated, no aphasia or dysarthria is noted. Extraocular movements are full. Visual fields are full. The tongue is midline, and the patient has symmetric elevation of the soft palate. No obvious hearing deficits are noted.  Motor: The motor testing reveals 5 over 5 strength of all 4 extremities, with exception of some mild proximal weakness in both legs. Good symmetric motor tone is noted throughout.  Sensory: Sensory testing is intact to pinprick, soft touch, vibration sensation, and position sense on all 4 extremities. No evidence of extinction is noted.  Coordination: Cerebellar testing reveals good finger-nose-finger and heel-to-shin bilaterally.  Gait and station: Gait is slightly wide-based, the patient usually uses a walker for ambulation. Tandem gait is unsteady. Romberg is negative. No drift is seen.  Reflexes: Deep tendon reflexes are symmetric and normal bilaterally. Toes are downgoing  bilaterally.   Assessment/Plan:  1. Gait disorder  2. "Dropped head syndrome"  The patient continues to have significant problems with flexion of the neck. At this point, even passive range of movement of the neck cannot straighten out the spine. The patient will be sent for physical therapy for range of movement and strengthening exercises. If the neck can be straighten somewhat, she may be able to use a  [place] collar to help keep the head up. The patient is using a soft collar that is essentially ineffective. She will follow-up in about 4 months.  Jill Alexanders MD 10/01/2014 7:47 PM  [facility] [address] [place], [place] [address]  Phone [phone] Fax [phone]

## 2014-10-01 NOTE — Patient Instructions (Addendum)
We will set you up for physical therapy to get range of motion improved with the neck and hopefully improve strength. Wearing a cervical collar with more support may help.    Fall Prevention and Home Safety Falls cause injuries and can affect all age groups. It is possible to use preventive measures to significantly decrease the likelihood of falls. There are many simple measures which can make your home safer and prevent falls. OUTDOORS  Repair cracks and edges of walkways and driveways.  Remove high doorway thresholds.  Trim shrubbery on the main path into your home.  Have good outside lighting.  Clear walkways of tools, rocks, debris, and clutter.  Check that handrails are not broken and are securely fastened. Both sides of steps should have handrails.  Have leaves, snow, and ice cleared regularly.  Use sand or salt on walkways during winter months.  In the garage, clean up grease or oil spills. BATHROOM  Install night lights.  Install grab bars by the toilet and in the tub and shower.  Use non-skid mats or decals in the tub or shower.  Place a plastic non-slip stool in the shower to sit on, if needed.  Keep floors dry and clean up all water on the floor immediately.  Remove soap buildup in the tub or shower on a regular basis.  Secure bath mats with non-slip, double-sided rug tape.  Remove throw rugs and tripping hazards from the floors. BEDROOMS  Install night lights.  Make sure a bedside light is easy to reach.  Do not use oversized bedding.  Keep a telephone by your bedside.  Have a firm chair with side arms to use for getting dressed.  Remove throw rugs and tripping hazards from the floor. KITCHEN  Keep handles on pots and pans turned toward the center of the stove. Use back burners when possible.  Clean up spills quickly and allow time for drying.  Avoid walking on wet floors.  Avoid hot utensils and knives.  Position shelves so they are  not too high or low.  Place commonly used objects within easy reach.  If necessary, use a sturdy step stool with a grab bar when reaching.  Keep electrical cables out of the way.  Do not use floor polish or wax that makes floors slippery. If you must use wax, use non-skid floor wax.  Remove throw rugs and tripping hazards from the floor. STAIRWAYS  Never leave objects on stairs.  Place handrails on both sides of stairways and use them. Fix any loose handrails. Make sure handrails on both sides of the stairways are as long as the stairs.  Check carpeting to make sure it is firmly attached along stairs. Make repairs to worn or loose carpet promptly.  Avoid placing throw rugs at the top or bottom of stairways, or properly secure the rug with carpet tape to prevent slippage. Get rid of throw rugs, if possible.  Have an electrician put in a light switch at the top and bottom of the stairs. OTHER FALL PREVENTION TIPS  Wear low-heel or rubber-soled shoes that are supportive and fit well. Wear closed toe shoes.  When using a stepladder, make sure it is fully opened and both spreaders are firmly locked. Do not climb a closed stepladder.  Add color or contrast paint or tape to grab bars and handrails in your home. Place contrasting color strips on first and last steps.  Learn and use mobility aids as needed. Install an electrical emergency response  system.  Turn on lights to avoid dark areas. Replace light bulbs that burn out immediately. Get light switches that glow.  Arrange furniture to create clear pathways. Keep furniture in the same place.  Firmly attach carpet with non-skid or double-sided tape.  Eliminate uneven floor surfaces.  Select a carpet pattern that does not visually hide the edge of steps.  Be aware of all pets. OTHER HOME SAFETY TIPS  Set the water temperature for 120 F (48.8 C).  Keep emergency numbers on or near the telephone.  Keep smoke detectors on  every level of the home and near sleeping areas. Document Released: 01/23/2002 Document Revised: 08/04/2011 Document Reviewed: 04/24/2011 [facility] Patient Information 2015 [place], [place]. This information is not intended to replace advice given to you by your health care provider. Make sure you discuss any questions you have with your health care provider.

## 2014-10-02 DIAGNOSIS — I10 Essential (primary) hypertension: Secondary | ICD-10-CM | POA: Diagnosis not present

## 2014-10-02 DIAGNOSIS — M6281 Muscle weakness (generalized): Secondary | ICD-10-CM | POA: Diagnosis not present

## 2014-10-02 DIAGNOSIS — I272 Other secondary pulmonary hypertension: Secondary | ICD-10-CM | POA: Diagnosis not present

## 2014-10-02 DIAGNOSIS — R296 Repeated falls: Secondary | ICD-10-CM | POA: Diagnosis not present

## 2014-10-02 DIAGNOSIS — E538 Deficiency of other specified B group vitamins: Secondary | ICD-10-CM | POA: Diagnosis not present

## 2014-10-02 DIAGNOSIS — E785 Hyperlipidemia, unspecified: Secondary | ICD-10-CM | POA: Diagnosis not present

## 2014-10-02 DIAGNOSIS — E039 Hypothyroidism, unspecified: Secondary | ICD-10-CM | POA: Diagnosis not present

## 2014-10-02 DIAGNOSIS — I480 Paroxysmal atrial fibrillation: Secondary | ICD-10-CM | POA: Diagnosis not present

## 2014-10-02 DIAGNOSIS — D649 Anemia, unspecified: Secondary | ICD-10-CM | POA: Diagnosis not present

## 2014-10-02 DIAGNOSIS — I2510 Atherosclerotic heart disease of native coronary artery without angina pectoris: Secondary | ICD-10-CM | POA: Diagnosis not present

## 2014-10-10 DIAGNOSIS — D2372 Other benign neoplasm of skin of left lower limb, including hip: Secondary | ICD-10-CM | POA: Diagnosis not present

## 2014-10-10 DIAGNOSIS — L821 Other seborrheic keratosis: Secondary | ICD-10-CM | POA: Diagnosis not present

## 2014-10-10 DIAGNOSIS — L281 Prurigo nodularis: Secondary | ICD-10-CM | POA: Diagnosis not present

## 2014-10-10 DIAGNOSIS — R600 Localized edema: Secondary | ICD-10-CM | POA: Diagnosis not present

## 2014-10-10 DIAGNOSIS — D1801 Hemangioma of skin and subcutaneous tissue: Secondary | ICD-10-CM | POA: Diagnosis not present

## 2014-10-10 DIAGNOSIS — L814 Other melanin hyperpigmentation: Secondary | ICD-10-CM | POA: Diagnosis not present

## 2014-10-10 DIAGNOSIS — D2271 Melanocytic nevi of right lower limb, including hip: Secondary | ICD-10-CM | POA: Diagnosis not present

## 2014-10-10 DIAGNOSIS — Z85828 Personal history of other malignant neoplasm of skin: Secondary | ICD-10-CM | POA: Diagnosis not present

## 2014-11-13 ENCOUNTER — Ambulatory Visit: Payer: Medicare Other | Attending: Neurology | Admitting: Rehabilitation

## 2014-11-13 ENCOUNTER — Encounter: Payer: Self-pay | Admitting: Rehabilitation

## 2014-11-13 DIAGNOSIS — R269 Unspecified abnormalities of gait and mobility: Secondary | ICD-10-CM | POA: Diagnosis not present

## 2014-11-13 DIAGNOSIS — R29898 Other symptoms and signs involving the musculoskeletal system: Secondary | ICD-10-CM | POA: Insufficient documentation

## 2014-11-13 DIAGNOSIS — R2681 Unsteadiness on feet: Secondary | ICD-10-CM

## 2014-11-13 DIAGNOSIS — R531 Weakness: Secondary | ICD-10-CM | POA: Insufficient documentation
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
## 2014-11-13 NOTE — Patient Instructions (Signed)
Provided verbal instruction to pt and husband regarding stretching of cervical flexion muscles and strengthening of cervical and capital extensors by slowly lowering herself to flat position (as best she can tolerate) with focus on maintaining midline head posture.  Then pt instructed to push head into pillow x 10 reps and relax.  Husband instructed to assist keep head to midline.

## 2014-11-18 ENCOUNTER — Emergency Department (HOSPITAL_COMMUNITY): Payer: Medicare Other

## 2014-11-18 ENCOUNTER — Emergency Department (HOSPITAL_COMMUNITY)
Admission: EM | Admit: 2014-11-18 | Discharge: 2014-11-18 | Disposition: A | Discharge status: Home / Self Care | Payer: Medicare Other | Attending: Emergency Medicine | Admitting: Emergency Medicine

## 2014-11-18 ENCOUNTER — Encounter (HOSPITAL_COMMUNITY): Payer: Self-pay | Admitting: *Deleted

## 2014-11-18 DIAGNOSIS — W01198A Fall on same level from slipping, tripping and stumbling with subsequent striking against other object, initial encounter: Secondary | ICD-10-CM | POA: Diagnosis not present

## 2014-11-18 DIAGNOSIS — R011 Cardiac murmur, unspecified: Secondary | ICD-10-CM | POA: Insufficient documentation

## 2014-11-18 DIAGNOSIS — Z880 Allergy status to penicillin: Secondary | ICD-10-CM | POA: Diagnosis not present

## 2014-11-18 DIAGNOSIS — D649 Anemia, unspecified: Secondary | ICD-10-CM | POA: Diagnosis not present

## 2014-11-18 DIAGNOSIS — S199XXA Unspecified injury of neck, initial encounter: Secondary | ICD-10-CM | POA: Insufficient documentation

## 2014-11-18 DIAGNOSIS — S79922A Unspecified injury of left thigh, initial encounter: Secondary | ICD-10-CM | POA: Insufficient documentation

## 2014-11-18 DIAGNOSIS — M1990 Unspecified osteoarthritis, unspecified site: Secondary | ICD-10-CM | POA: Diagnosis not present

## 2014-11-18 DIAGNOSIS — E785 Hyperlipidemia, unspecified: Secondary | ICD-10-CM | POA: Insufficient documentation

## 2014-11-18 DIAGNOSIS — I2510 Atherosclerotic heart disease of native coronary artery without angina pectoris: Secondary | ICD-10-CM | POA: Diagnosis not present

## 2014-11-18 DIAGNOSIS — M79652 Pain in left thigh: Secondary | ICD-10-CM | POA: Diagnosis not present

## 2014-11-18 DIAGNOSIS — W19XXXA Unspecified fall, initial encounter: Secondary | ICD-10-CM

## 2014-11-18 DIAGNOSIS — S0990XA Unspecified injury of head, initial encounter: Secondary | ICD-10-CM | POA: Diagnosis not present

## 2014-11-18 DIAGNOSIS — Z79899 Other long term (current) drug therapy: Secondary | ICD-10-CM | POA: Insufficient documentation

## 2014-11-18 DIAGNOSIS — Z7982 Long term (current) use of aspirin: Secondary | ICD-10-CM | POA: Insufficient documentation

## 2014-11-18 DIAGNOSIS — I129 Hypertensive chronic kidney disease with stage 1 through stage 4 chronic kidney disease, or unspecified chronic kidney disease: Secondary | ICD-10-CM | POA: Diagnosis not present

## 2014-11-18 DIAGNOSIS — Y92000 Kitchen of unspecified non-institutional (private) residence as  the place of occurrence of the external cause: Secondary | ICD-10-CM | POA: Diagnosis not present

## 2014-11-18 DIAGNOSIS — Q820 Hereditary lymphedema: Secondary | ICD-10-CM | POA: Diagnosis not present

## 2014-11-18 DIAGNOSIS — G894 Chronic pain syndrome: Secondary | ICD-10-CM | POA: Insufficient documentation

## 2014-11-18 DIAGNOSIS — Y9389 Activity, other specified: Secondary | ICD-10-CM | POA: Diagnosis not present

## 2014-11-18 DIAGNOSIS — Z91040 Latex allergy status: Secondary | ICD-10-CM | POA: Diagnosis not present

## 2014-11-18 DIAGNOSIS — Y998 Other external cause status: Secondary | ICD-10-CM | POA: Diagnosis not present

## 2014-11-18 DIAGNOSIS — E039 Hypothyroidism, unspecified: Secondary | ICD-10-CM | POA: Diagnosis not present

## 2014-11-18 DIAGNOSIS — I252 Old myocardial infarction: Secondary | ICD-10-CM | POA: Diagnosis not present

## 2014-11-18 DIAGNOSIS — F329 Major depressive disorder, single episode, unspecified: Secondary | ICD-10-CM | POA: Insufficient documentation

## 2014-11-18 DIAGNOSIS — N189 Chronic kidney disease, unspecified: Secondary | ICD-10-CM | POA: Insufficient documentation

## 2014-11-18 DIAGNOSIS — Z8611 Personal history of tuberculosis: Secondary | ICD-10-CM | POA: Insufficient documentation

## 2014-11-18 DIAGNOSIS — R42 Dizziness and giddiness: Secondary | ICD-10-CM

## 2014-11-18 DIAGNOSIS — R102 Pelvic and perineal pain: Secondary | ICD-10-CM | POA: Diagnosis not present

## 2014-11-18 DIAGNOSIS — I509 Heart failure, unspecified: Secondary | ICD-10-CM | POA: Insufficient documentation

## 2014-11-18 DIAGNOSIS — J449 Chronic obstructive pulmonary disease, unspecified: Secondary | ICD-10-CM | POA: Insufficient documentation

## 2014-11-18 DIAGNOSIS — I4891 Unspecified atrial fibrillation: Secondary | ICD-10-CM | POA: Diagnosis not present

## 2014-11-18 DIAGNOSIS — T148 Other injury of unspecified body region: Secondary | ICD-10-CM | POA: Diagnosis not present

## 2014-11-18 LAB — CBC WITH DIFFERENTIAL/PLATELET
BASOS ABS: 0 10*3/uL (ref 0.0–0.1)
Basophils Relative: 1 %
EOS ABS: 0.5 10*3/uL (ref 0.0–0.7)
EOS PCT: 7 %
HCT: 27.3 % — ABNORMAL LOW (ref 36.0–46.0)
Hemoglobin: 8.8 g/dL — ABNORMAL LOW (ref 12.0–15.0)
Lymphocytes Relative: 18 %
Lymphs Abs: 1.2 10*3/uL (ref 0.7–4.0)
MCH: 31.2 pg (ref 26.0–34.0)
MCHC: 32.2 g/dL (ref 30.0–36.0)
MCV: 96.8 fL (ref 78.0–100.0)
MONO ABS: 0.4 10*3/uL (ref 0.1–1.0)
Monocytes Relative: 6 %
Neutro Abs: 4.6 10*3/uL (ref 1.7–7.7)
Neutrophils Relative %: 68 %
PLATELETS: 134 10*3/uL — AB (ref 150–400)
RBC: 2.82 MIL/uL — ABNORMAL LOW (ref 3.87–5.11)
RDW: 12.4 % (ref 11.5–15.5)
WBC: 6.7 10*3/uL (ref 4.0–10.5)

## 2014-11-18 LAB — BASIC METABOLIC PANEL
Anion gap: 8 (ref 5–15)
BUN: 85 mg/dL — AB (ref 6–20)
CALCIUM: 9.4 mg/dL (ref 8.9–10.3)
CO2: 28 mmol/L (ref 22–32)
CREATININE: 2.28 mg/dL — AB (ref 0.44–1.00)
Chloride: 99 mmol/L — ABNORMAL LOW (ref 101–111)
GFR calc Af Amer: 22 mL/min — ABNORMAL LOW (ref >60)
GFR, EST NON AFRICAN AMERICAN: 19 mL/min — AB (ref >60)
GLUCOSE: 108 mg/dL — AB (ref 65–99)
Potassium: 4.7 mmol/L (ref 3.5–5.1)
SODIUM: 135 mmol/L (ref 135–145)

## 2014-11-18 LAB — URINALYSIS, ROUTINE W REFLEX MICROSCOPIC
BILIRUBIN URINE: NEGATIVE
Glucose, UA: NEGATIVE mg/dL
HGB URINE DIPSTICK: NEGATIVE
KETONES UR: NEGATIVE mg/dL
Leukocytes, UA: NEGATIVE
Nitrite: NEGATIVE
Protein, ur: NEGATIVE mg/dL
Specific Gravity, Urine: 1.01 (ref 1.005–1.030)
UROBILINOGEN UA: 0.2 mg/dL (ref 0.0–1.0)
pH: 6 (ref 5.0–8.0)

## 2014-11-18 MED ORDER — MORPHINE SULFATE ER 30 MG PO TBCR
60.0000 mg | EXTENDED_RELEASE_TABLET | Freq: Two times a day (BID) | ORAL | Status: DC
  Administered 2014-11-18: 60 mg via ORAL
  Filled 2014-11-18: qty 4

## 2014-11-18 MED ORDER — SODIUM CHLORIDE 0.9 % IV BOLUS (SEPSIS)
500.0000 mL | Freq: Once | INTRAVENOUS | Status: AC
  Administered 2014-11-18: 500 mL via INTRAVENOUS

## 2014-11-18 MED ORDER — MECLIZINE HCL 25 MG PO TABS: 25.0000 mg | ORAL_TABLET | Freq: Three times a day (TID) | ORAL | Status: DC | PRN

## 2014-11-18 MED ORDER — MECLIZINE HCL 25 MG PO TABS
25.0000 mg | ORAL_TABLET | Freq: Once | ORAL | Status: AC
  Administered 2014-11-18: 25 mg via ORAL
  Filled 2014-11-18: qty 1

## 2014-11-18 NOTE — ED Notes (Signed)
Pt denies being on blood thinners but states she take Lisinopril for BP. She isn't quiet sure why she is taking this medication since she has low BP's.

## 2014-11-18 NOTE — Discharge Instructions (Signed)
Read the information below.  Use the prescribed medication as directed.  Please discuss all new medications with your pharmacist.  You may return to the Emergency Department at any time for worsening condition or any new symptoms that concern you.   If you develop fevers, severe headache, weakness or numbness of the arms or legs, difficulty speaking or walking, uncontrolled dizziness, or if you fall or pass out, call 911 and return to the Emergency Department immediately.     Dizziness Dizziness is a common problem. It is a feeling of unsteadiness or light-headedness. You may feel like you are about to faint. Dizziness can lead to injury if you stumble or fall. A person of any age group can suffer from dizziness, but dizziness is more common in older adults. CAUSES  Dizziness can be caused by many different things, including:  Middle ear problems.  Standing for too long.  Infections.  An allergic reaction.  Aging.  An emotional response to something, such as the sight of blood.  Side effects of medicines.  Tiredness.  Problems with circulation or blood pressure.  Excessive use of alcohol or medicines, or illegal drug use.  Breathing too fast (hyperventilation).  An irregular heart rhythm (arrhythmia).  A low red blood cell count (anemia).  Pregnancy.  Vomiting, diarrhea, fever, or other illnesses that cause body fluid loss (dehydration).  Diseases or conditions such as Parkinson's disease, high blood pressure (hypertension), diabetes, and thyroid problems.  Exposure to extreme heat. DIAGNOSIS  Your health care provider will ask about your symptoms, perform a physical exam, and perform an electrocardiogram (ECG) to record the electrical activity of your heart. Your health care provider may also perform other heart or blood tests to determine the cause of your dizziness. These may include:  Transthoracic echocardiogram (TTE). During echocardiography, sound waves are used to  evaluate how blood flows through your heart.  Transesophageal echocardiogram (TEE).  Cardiac monitoring. This allows your health care provider to monitor your heart rate and rhythm in real time.  Holter monitor. This is a portable device that records your heartbeat and can help diagnose heart arrhythmias. It allows your health care provider to track your heart activity for several days if needed.  Stress tests by exercise or by giving medicine that makes the heart beat faster. TREATMENT  Treatment of dizziness depends on the cause of your symptoms and can vary greatly. HOME CARE INSTRUCTIONS   Drink enough fluids to keep your urine clear or pale yellow. This is especially important in very hot weather. In older adults, it is also important in cold weather.  Take your medicine exactly as directed if your dizziness is caused by medicines. When taking blood pressure medicines, it is especially important to get up slowly.  Rise slowly from chairs and steady yourself until you feel okay.  In the morning, first sit up on the side of the bed. When you feel okay, stand slowly while holding onto something until you know your balance is fine.  Move your legs often if you need to stand in one place for a long time. Tighten and relax your muscles in your legs while standing.  Have someone stay with you for 1-2 days if dizziness continues to be a problem. Do this until you feel you are well enough to stay alone. Have the person call your health care provider if he or she notices changes in you that are concerning.  Do not drive or use heavy machinery if you feel dizzy.  Do not drink alcohol. SEEK IMMEDIATE MEDICAL CARE IF:   Your dizziness or light-headedness gets worse.  You feel nauseous or vomit.  You have problems talking, walking, or using your arms, hands, or legs.  You feel weak.  You are not thinking clearly or you have trouble forming sentences. It may take a friend or family member  to notice this.  You have chest pain, abdominal pain, shortness of breath, or sweating.  Your vision changes.  You notice any bleeding.  You have side effects from medicine that seems to be getting worse rather than better. MAKE SURE YOU:   Understand these instructions.  Will watch your condition.  Will get help right away if you are not doing well or get worse. Document Released: 07/29/2000 Document Revised: 02/07/2013 Document Reviewed: 08/22/2010 [facility] Patient Information 2015 [place], [place]. This information is not intended to replace advice given to you by your health care provider. Make sure you discuss any questions you have with your health care provider.   Chronic Kidney Disease Chronic kidney disease occurs when the kidneys are damaged over a long period. The kidneys are two organs that lie on either side of the spine between the middle of the back and the front of the abdomen. The kidneys:   Remove wastes and extra water from the blood.   Produce important hormones. These help keep bones strong, regulate blood pressure, and help create red blood cells.   Balance the fluids and chemicals in the blood and tissues. A small amount of kidney damage may not cause problems, but a large amount of damage may make it difficult or impossible for the kidneys to work the way they should. If steps are not taken to slow down the kidney damage or stop it from getting worse, the kidneys may stop working permanently. Most of the time, chronic kidney disease does not go away. However, it can often be controlled, and those with the disease can usually live normal lives. CAUSES  The most common causes of chronic kidney disease are diabetes and high blood pressure (hypertension). Chronic kidney disease may also be caused by:   Diseases that cause the kidneys' filters to become inflamed.   Diseases that affect the immune system.   Genetic diseases.   Medicines that damage the  kidneys, such as anti-inflammatory medicines.  Poisoning or exposure to toxic substances.   A reoccurring kidney or urinary infection.   A problem with urine flow. This may be caused by:   Cancer.   Kidney stones.   An enlarged prostate in males. SIGNS AND SYMPTOMS  Because the kidney damage in chronic kidney disease occurs slowly, symptoms develop slowly and may not be obvious until the kidney damage becomes severe. A person may have a kidney disease for years without showing any symptoms. Symptoms can include:   Swelling (edema) of the legs, ankles, or feet.   Tiredness (lethargy).   Nausea or vomiting.   Confusion.   Problems with urination, such as:   Decreased urine production.   Frequent urination, especially at night.   Frequent accidents in children who are potty trained.   Muscle twitches and cramps.   Shortness of breath.  Weakness.   Persistent itchiness.   Loss of appetite.  Metallic taste in the mouth.  Trouble sleeping.  Slowed development in children.  Short stature in children. DIAGNOSIS  Chronic kidney disease may be detected and diagnosed by tests, including blood, urine, imaging, or kidney biopsy tests.  TREATMENT  Most chronic kidney diseases cannot be cured. Treatment usually involves relieving symptoms and preventing or slowing the progression of the disease. Treatment may include:   A special diet. You may need to avoid alcohol and foods thatare salty and high in potassium.   Medicines. These may:   Lower blood pressure.   Relieve anemia.   Relieve swelling.   Protect the bones. HOME CARE INSTRUCTIONS   Follow your prescribed diet.   Take medicines only as directed by your health care provider. Do not take any new medicines (prescription, over-the-counter, or nutritional supplements) unless approved by your health care provider. Many medicines can worsen your kidney damage or need to have the dose  adjusted.   Quit smoking if you smoke. Talk to your health care provider about a smoking cessation program.   Keep all follow-up visits as directed by your health care provider. SEEK IMMEDIATE MEDICAL CARE IF:  Your symptoms get worse or you develop new symptoms.   You develop symptoms of end-stage kidney disease. These include:   Headaches.   Abnormally dark or light skin.   Numbness in the hands or feet.   Easy bruising.   Frequent hiccups.   Menstruation stops.   You have a fever.   You have decreased urine production.   You havepain or bleeding when urinating. MAKE SURE YOU:  Understand these instructions.  Will watch your condition.  Will get help right away if you are not doing well or get worse. FOR MORE INFORMATION   American Association of Kidney Patients: BombTimer.gl  National Kidney Foundation: www.kidney.[place]: https://mathis.com/  [facility]: www.lifeoptions.org and www.kidneyschool.org Document Released: 11/12/2007 Document Revised: 06/19/2013 Document Reviewed: 10/02/2011 [facility] Patient Information 2015 [place], [place]. This information is not intended to replace advice given to you by your health care provider. Make sure you discuss any questions you have with your health care provider.   Anemia, Nonspecific Anemia is a condition in which the concentration of red blood cells or hemoglobin in the blood is below normal. Hemoglobin is a substance in red blood cells that carries oxygen to the tissues of the body. Anemia results in not enough oxygen reaching these tissues.  CAUSES  Common causes of anemia include:   Excessive bleeding. Bleeding may be internal or external. This includes excessive bleeding from periods (in women) or from the intestine.   Poor nutrition.   Chronic kidney, thyroid, and liver disease.  Bone marrow disorders that decrease red blood cell production.  Cancer  and treatments for cancer.  HIV, AIDS, and their treatments.  Spleen problems that increase red blood cell destruction.  Blood disorders.  Excess destruction of red blood cells due to infection, medicines, and autoimmune disorders. SIGNS AND SYMPTOMS   Minor weakness.   Dizziness.   Headache.  Palpitations.   Shortness of breath, especially with exercise.   Paleness.  Cold sensitivity.  Indigestion.  Nausea.  Difficulty sleeping.  Difficulty concentrating. Symptoms may occur suddenly or they may develop slowly.  DIAGNOSIS  Additional blood tests are often needed. These help your health care provider determine the best treatment. Your health care provider will check your stool for blood and look for other causes of blood loss.  TREATMENT  Treatment varies depending on the cause of the anemia. Treatment can include:   Supplements of iron, vitamin F79, or folic acid.   Hormone medicines.   A blood transfusion. This may be needed if blood loss is severe.   Hospitalization.  This may be needed if there is significant continual blood loss.   Dietary changes.  Spleen removal. HOME CARE INSTRUCTIONS Keep all follow-up appointments. It often takes many weeks to correct anemia, and having your health care provider check on your condition and your response to treatment is very important. SEEK IMMEDIATE MEDICAL CARE IF:   You develop extreme weakness, shortness of breath, or chest pain.   You become dizzy or have trouble concentrating.  You develop heavy vaginal bleeding.   You develop a rash.   You have bloody or black, tarry stools.   You faint.   You vomit up blood.   You vomit repeatedly.   You have abdominal pain.  You have a fever or persistent symptoms for more than 2-3 days.   You have a fever and your symptoms suddenly get worse.   You are dehydrated.  MAKE SURE YOU:  Understand these instructions.  Will watch your  condition.  Will get help right away if you are not doing well or get worse. Document Released: 03/12/2004 Document Revised: 10/05/2012 Document Reviewed: 07/29/2012 [facility] Patient Information 2015 [place], [place]. This information is not intended to replace advice given to you by your health care provider. Make sure you discuss any questions you have with your health care provider.

## 2014-11-18 NOTE — ED Notes (Signed)
Resting quietly with eyes closed. Easily arousable. Verbally responsive. A/O x4. Resp even and unlabored. No audible adventitious breath sounds noted. ABC's intact. IV saline lock patent and intact.

## 2014-11-18 NOTE — ED Notes (Signed)
Awake. Verbally responsive. A/O x4. Resp even and unlabored. No audible adventitious breath sounds noted. ABC's intact.  

## 2014-11-18 NOTE — ED Notes (Signed)
Pt returned from CT scan and x-ray without distress noted.

## 2014-11-18 NOTE — ED Notes (Signed)
Pt ambulated to BR with assistance. Pt reported that she felt dizziness. Denies headaches and visual disturbances. PA aware.

## 2014-11-18 NOTE — ED Notes (Addendum)
Awake. Verbally responsive. A/O x4. Resp even and unlabored. No audible adventitious breath sounds noted. ABC's intact. IV saline lock patent and intact infusing NS at 989ml/hr without difficulty.

## 2014-11-18 NOTE — ED Notes (Signed)
Bed: WA04 Expected date:  Expected time:  Means of arrival:  Comments: EMS Fall 

## 2014-11-18 NOTE — ED Notes (Signed)
Resting quietly with eye closed. Easily arousable. Verbally responsive. Resp even and unlabored. ABC's intact. IV saline lock patent and intact.  

## 2014-11-18 NOTE — ED Notes (Signed)
MD at bedside. 

## 2014-11-18 NOTE — ED Notes (Signed)
Pt ambulated to the bathroom. Pt c/o extreme dizziness upon sitting up that subsided after a few minutes. Pt denied any dizziness while actively walking but when she sat down in the bathroom she mentioned that her head started to spin again.

## 2014-11-18 NOTE — ED Notes (Signed)
Pt refused Hemoccult procedure.

## 2014-11-18 NOTE — ED Provider Notes (Signed)
CSN: [id number]     Arrival date & time 11/18/14  9357 History   First MD Initiated Contact with Patient 11/18/14 [phone]     Chief Complaint  Patient presents with  . Fall     (Consider location/radiation/quality/duration/timing/severity/associated sxs/prior Treatment) The history is provided by the patient.     Pt with multiple medical problems and frequent falls reports she was standing in the kitchen at the sink, turned to step to her walker and fell onto her left side.  She did hit her head and lost consciousness briefly.  Reports pain in the left side of her neck and mild pain in the left thigh only with palpation.  States she falls all of the time and is unstable on her feet, this is a chronic problem. States she has a cough that is typical for her with her COPD and this is unchanged, not concerning to her at this time.  She has chronic urinary urgency that may be a little worse than usual.  Notes she did have a fever and myalgias for about three days last weekend that completely resolved, had no other associated symptoms.  Denies headache, current dizziness, CP, SOB, hip pain, numbness or weakness of the extremities.   Has not taken her morning pain medication, requests this only at this time.    Past Medical History  Diagnosis Date  . Anxiety   . Arthritis   . COPD (chronic obstructive pulmonary disease) ([place])   . Emphysema of lung ([place])   . Tuberculosis   . MI (myocardial infarction) ([place]) 1999    Unstable angina  . Lung nodule   . Diverticulosis of colon   . Depression   . Milroy's disease     Lymphatic disorder diagnosed at [facility]  . Chronic anticoagulation     A fib. On Coumadin.  . IBS (irritable bowel syndrome)   . Edema of both legs     Venous Doppler 05/11/12 was normal & no evidence of thrombus or thrombophlebitis. Chronic LE edema related to both cor pulmonale & lymphedema, hyperlipidemia & HTN involving coronary atherosclerosis by cardiac cath in 2004. Cath showed a  tiny old occlusion of the optional diagonal branch & also proximal LAD.  [name] Chronic pain syndrome   . Hyperlipidemia     On a statin. Catheterization 02/12/03 Showed a tiny old occlusion of the optional diagonal branch & also proximal LAD.  [name] Hypertension     Myoview 07/2011 EF = >55%. RV was moderately dilated. LA was severely dilated. Catheterization 02/12/03 Showed a tiny old occlusion of the optional diagonal branch & also proximal LAD.  [name] Coronary artery disease   . Coronary atherosclerosis     Mild. Myoview 07/2011 EF = >55%. RV was moderately dilated. LA was severely dilated. Catheterization 02/12/03 Showed a tiny old occlusion of the optional diagonal branch & also proximal LAD.  [name] Chronic cor pulmonale (HCC)     Most likely due to COPD (cannot rule out contribution of diastolic LV dysfunction). ECHO 07/30/11 pulmonary artery pressure was estimated to be 50-60% mmHg & there was moderate tricuspid insufficiency.  . Frequent falls     Possibly due to orthostatic hypotension but we have not seen that on our visits. Recent fall 05/2012 with scalp hematoma, swollen left LE, & black eyes.  . Obstructive sleep apnea     Polysomnogram 09/25/11 AHI: 53.6/hr overall & 57.1/hr during REM sleep. Poor compliance previously with CPAP  . Pulmonary hypertension ([place])  Pullmonary hypertension, chronic cor pulmonale w/pulmonary artery pressures of 50&#x2011; 60 mmHg.  . CHF (congestive heart failure) (HCC)     Acute on chronic, diastolic, improved. Predominantly right heart failure due to cor pulmonale & in turn this is most likely due to untreated OSA.  [name] Dyspnea on exertion     Reluctant to take diuretics  . Atrial fibrillation, chronic (HCC)     On Coumadin & a beta-blocker  . Cancer [facility])     Carcinoma of sigmoid colon [place].  [name] Bowel obstruction (HCC)     Caused by scar tissue  . Hypothyroidism 2002    On hormone replacement therapy  . Asthma     On nebulizer  . Insomnia   . 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 Appendectomy  1956  . Abdominal hysterectomy  1983  . Colon resection      2 times  . Cholecystectomy  1984  . Colon biopsy    . Cardiac catheterization  02/12/03    Showed a tiny old occlusion of the optional diagonal branch & also proximal LAD.  [name] Colonoscopy  07/23/10   Family History  Problem Relation Age of Onset  . Heart disease Father   . Alzheimer's disease Mother   . Parkinsonism Brother   . Diabetes Brother    Social History  Substance Use Topics  . Smoking status: Never Smoker   . Smokeless tobacco: Never Used  . Alcohol Use: No   OB History    No data available     Review of Systems  All other systems reviewed and are negative.     Allergies  Codeine; Ciprofloxacin; Latex; Penicillins; and Sulfonamide derivatives  Home Medications   Prior to Admission medications   Medication Sig Start Date End Date Taking? Authorizing Provider  aspirin EC 81 MG tablet Take 81 mg by mouth every morning.     Historical Provider, MD  atorvastatin (LIPITOR) 40 MG tablet Take 40 mg by mouth daily.    Historical Provider, MD  cholecalciferol (VITAMIN D) 1000 UNITS tablet Take 1,000 Units by mouth daily.    Historical Provider, MD  levalbuterol (XOPENEX HFA) 45 MCG/ACT inhaler Inhale 1 puff into the lungs every 4 (four) hours as needed for wheezing.    Historical Provider, MD  levothyroxine (SYNTHROID, LEVOTHROID) 112 MCG tablet Take 1 tablet (112 mcg total) by mouth every morning. 06/30/13   Mihai Croitoru, MD  lisinopril (PRINIVIL,ZESTRIL) 20 MG tablet Take 20 mg by mouth daily.    Historical Provider, MD  magnesium oxide (MAG-OX) 400 (241.3 MG) MG tablet Take 1 tablet (400 mg total) by mouth daily. 07/11/13   Denice Paradise Tisovec, MD  metoprolol succinate (TOPROL-XL) 25 MG 24 hr tablet Take 1 tablet (25 mg total) by mouth daily. 07/11/13   Denice Paradise Tisovec, MD  morphine (MS CONTIN) 60 MG 12 hr tablet Take 60 mg  by mouth every 12 (twelve) hours.    Historical Provider, MD  morphine (MSIR) 30 MG tablet Take 30 mg by mouth 3 (three) times daily as needed (breakthrough pain).     Historical Provider, MD  nitroGLYCERIN (NITROLINGUAL) 0.4 MG/SPRAY spray Place 1 spray under the tongue every 5 (five) minutes as needed for chest pain. 12/13/12   Mihai Croitoru, MD  potassium chloride (K-DUR,KLOR-CON) 10 MEQ tablet Take 10 mEq by mouth daily.     Historical Provider, MD  sennosides-docusate sodium (SENOKOT-S) 8.6-50 MG tablet Take 2-3  tablets by mouth daily as needed for constipation.    Historical Provider, MD  sertraline (ZOLOFT) 100 MG tablet Take 100 mg by mouth daily.    Historical Provider, MD  torsemide (DEMADEX) 20 MG tablet Take 20-40 mg by mouth 2 (two) times daily. Take 40 mg every morning and 20 mg every evening    Historical Provider, MD  zolpidem (AMBIEN CR) 6.25 MG CR tablet Take 6.25 mg by mouth at bedtime.    Historical Provider, MD   BP 91/49 mmHg  Pulse 73  Temp(Src) 97.9 F (36.6 C) (Oral)  Resp 19  SpO2 93% Physical Exam  Constitutional: She appears well-developed and well-nourished. No distress.  HENT:  Head: Normocephalic and atraumatic.  Neck: Neck supple.    Cardiovascular: Normal rate.   Murmur heard. Pulmonary/Chest: Effort normal and breath sounds normal. No respiratory distress. She has no wheezes. She has no rales. She exhibits no tenderness.  Abdominal: Soft. She exhibits no distension. There is no tenderness. There is no rebound and no guarding.  Musculoskeletal:       Legs: Neurological: She is alert.  Skin: She is not diaphoretic.  Nursing note and vitals reviewed.   ED Course  Procedures (including critical care time) Labs Review Labs Reviewed  BASIC METABOLIC PANEL - Abnormal; Notable for the following:    Chloride 99 (*)    Glucose, Bld 108 (*)    BUN 85 (*)    Creatinine, Ser 2.28 (*)    GFR calc non Af Amer 19 (*)    GFR calc Af Amer 22 (*)    All  other components within normal limits  CBC WITH DIFFERENTIAL/PLATELET - Abnormal; Notable for the following:    RBC 2.82 (*)    Hemoglobin 8.8 (*)    HCT 27.3 (*)    Platelets 134 (*)    All other components within normal limits  URINE CULTURE  URINALYSIS, ROUTINE W REFLEX MICROSCOPIC (NOT AT [facility])  OCCULT BLOOD X 1 CARD TO LAB, STOOL    Imaging Review Dg Pelvis 1-2 Views  11/18/2014   CLINICAL DATA:  Pain following fall on left side  EXAM: PELVIS - 1-2 VIEW  COMPARISON:  None.  FINDINGS: There is no evidence of pelvic fracture or dislocation. Hip joints appear symmetric bilaterally. There is postoperative change in the pelvis. There is lower lumbar levoscoliosis. Mild soft tissue prominence on the left may be due to reported left thigh region hematoma.  IMPRESSION: No fracture or dislocation.  Joint spaces appear intact.   Electronically Signed   By: Lowella Grip III M.D.   On: 11/18/2014 07:36   Ct Head Wo Contrast  11/18/2014   CLINICAL DATA:  Fall.  Head injury  EXAM: CT HEAD WITHOUT CONTRAST  CT CERVICAL SPINE WITHOUT CONTRAST  TECHNIQUE: Multidetector CT imaging of the head and cervical spine was performed following the standard protocol without intravenous contrast. Multiplanar CT image reconstructions of the cervical spine were also generated.  COMPARISON:  10/30/2013  FINDINGS: CT HEAD FINDINGS  Mild atrophy. Mild chronic microvascular ischemic change in the white matter.  Negative for acute infarct. Negative for hemorrhage or mass. No edema or shift of the midline structures  Negative for skull fracture.  CT CERVICAL SPINE FINDINGS  Accentuated cervical kyphosis. 4 mm anterior slip C3-4 with disc and facet degeneration. No fracture. Disc degeneration and spondylosis at C4-5, C5-6, C6-7.  Negative for fracture.  IMPRESSION: Atrophy and chronic microvascular ischemic change in the white matter. No acute  intracranial abnormality  Cervical spondylosis. 4 mm anterior slip C3-4 felt to be  related to disc and facet degeneration. Negative for cervical spine fracture.   Electronically Signed   By: Franchot Gallo M.D.   On: 11/18/2014 07:21   Ct Cervical Spine Wo Contrast  11/18/2014   CLINICAL DATA:  Fall.  Head injury  EXAM: CT HEAD WITHOUT CONTRAST  CT CERVICAL SPINE WITHOUT CONTRAST  TECHNIQUE: Multidetector CT imaging of the head and cervical spine was performed following the standard protocol without intravenous contrast. Multiplanar CT image reconstructions of the cervical spine were also generated.  COMPARISON:  10/30/2013  FINDINGS: CT HEAD FINDINGS  Mild atrophy. Mild chronic microvascular ischemic change in the white matter.  Negative for acute infarct. Negative for hemorrhage or mass. No edema or shift of the midline structures  Negative for skull fracture.  CT CERVICAL SPINE FINDINGS  Accentuated cervical kyphosis. 4 mm anterior slip C3-4 with disc and facet degeneration. No fracture. Disc degeneration and spondylosis at C4-5, C5-6, C6-7.  Negative for fracture.  IMPRESSION: Atrophy and chronic microvascular ischemic change in the white matter. No acute intracranial abnormality  Cervical spondylosis. 4 mm anterior slip C3-4 felt to be related to disc and facet degeneration. Negative for cervical spine fracture.   Electronically Signed   By: Franchot Gallo M.D.   On: 11/18/2014 07:21   Dg Femur Min 2 Views Left  11/18/2014   CLINICAL DATA:  Fall  EXAM: LEFT FEMUR 2 VIEWS  COMPARISON:  None.  FINDINGS: Negative for fracture. No bone lesion. Left hip joint is normal. Degenerative change in the medial compartment of the left knee with marked joint space narrowing and spurring.  IMPRESSION: Negative for fracture.   Electronically Signed   By: Franchot Gallo M.D.   On: 11/18/2014 07:36   I have personally reviewed and evaluated these images and lab results as part of my medical decision-making.   EKG Interpretation None       7:32 AM Discussed pt with Dr Tyrone Nine who will also see the  patient.   10:01 AM Pt has had 7 episodes of dizziness, described as spinning, since her arrival.  Spinning episodes last 1-2 minutes, not triggered by anything in particular.  She feels she might fall when this happens.  She is unsure if she had this happen when she fell.  Will give meclizine, add labs, IVF.   CN II-XII intact, EOMs intact, no pronator drift, grip strengths equal bilaterally; strength 5/5 in all extremities, sensation intact in all extremities; finger to nose, heel to shin, rapid alternating movements normal.  Gait testing deferred.   11:31 AM Pt reports she feels much better after meclizine.  No further episodes of spinning/ dizziness.  Discussed lab results.  Pt declines rectal exam at this time.  Has hx colon cancer in 1980s, followed by Dr Olevia Perches with negative colonoscopy (tiny polyps only) 4 years ago.    11:43 AM Per tech, pt ambulated well.  Notes she reported dizziness with change of positions, sitting from lying down, and with looking down.    12:43 PM I have spoken at length with the patient regarding her lab results, my recommendations for admission, and recommendation for MRI.  Pt states she cannot tolerate closed MRI and she absolutely cannot stay for admission.  She has a husband at home with dementia for whom she is the primary caregiver.  She verbalizes complete understanding of my concerns and the possibility of stroke.  She does have  a major risk factor for stroke with her hx afib without anticoagulation.  She is aware of this.  We discussed at length the benefit of admission and what might be done inpatient for her.  She is aware that if she goes home her condition may worsen and that she may have an evolving stroke or she may have another stroke - while this may just be benign positional vertigo I do no feel we have completely ruled out stroke and she verbalizes understanding of this.  My concern about discharging her home also involves her fall risk given the dizziness  and possible injuries that may occur.   We have also discussed her worsening renal function as well as her significant anemia.  She declines rectal exam and does not want admission for hydration.  I have advised that she follow closely with Dr Osborne Casco, call him first thing in the morning and be seen as soon as possible.  She agrees to this.  I have also let her know that if she changes her mind at any time she is welcome to come back.  Pt is awake, alert, is able to full verbalize her situation and my concerns, and appears to have full capacity to make medical decisions for herself.    MDM   Final diagnoses:  Fall, initial encounter  Dizziness  Chronic kidney disease, unspecified stage  Anemia, unspecified anemia type    Afebrile, nontoxic patient with fall while at home, pain in the left neck after fall.  CTs, xrays negative for acute injury.  While in ED pt noted she was dizzy (spinning).  The dizziness seemed to be positional, worse with head movement or change in body position - this is likely vertigo, though pt did not completely improve with meclizine and there is still the possibility she may have had a small stroke.  She has the risk factor of chronic afib.  My other concern is that pt's increased risk of fall given her persistent symptoms and her problem with frequent falls at baseline.  She has also had decreased renal function, unclear if it has been gradual or acute, as well as significant drop in Hgb to 8.8 (Hgb10 one year ago) - she declined further workup of this and declined admission.  Please see discussion above regarding my discussion with her about admission.   Pt treated in ED with IVF, meclizine.  D/C home with meclizine, close PCP follow up.  Discussed result, findings, treatment, and follow up  with patient.  Pt given return precautions.  Pt verbalizes understanding and agrees with plan.         Clayton Bibles, PA-C 11/18/14 1533  Lacretia Leigh, MD 11/20/14 [phone]

## 2014-11-18 NOTE — ED Notes (Signed)
Awake. Verbally responsive. A/O x4. Resp even and unlabored. No audible adventitious breath sounds noted. ABC's intact. IV saline lock patent and intact infusing NS at 924ml/hr without difficulty.

## 2014-11-18 NOTE — ED Notes (Signed)
Per EMS, pt states she fell this morning when she turned to reach for walker to ambulate to the room, striking her head on the floor. She admits to brief LOC. Pt husband heard the fall and called EMS. Pt has chronic low blood pressures. She has a hematoma to left thigh. She also has chronic BLE edema secondary to tumors. No crepitus assessed and neurological exam WNL per EMS. Normally patient has hypotension and rigid neck structure positioned to the left side.

## 2014-11-18 NOTE — ED Notes (Signed)
Awake. Verbally responsive. A/O x4. Resp even and unlabored. No audible adventitious breath sounds noted. ABC's intact. IV saline lock patent and intact. 

## 2014-11-19 ENCOUNTER — Ambulatory Visit: Payer: Medicare Other | Admitting: Rehabilitation

## 2014-11-19 DIAGNOSIS — M6281 Muscle weakness (generalized): Secondary | ICD-10-CM | POA: Diagnosis not present

## 2014-11-19 DIAGNOSIS — Z23 Encounter for immunization: Secondary | ICD-10-CM | POA: Diagnosis not present

## 2014-11-19 DIAGNOSIS — R42 Dizziness and giddiness: Secondary | ICD-10-CM | POA: Diagnosis not present

## 2014-11-19 DIAGNOSIS — E039 Hypothyroidism, unspecified: Secondary | ICD-10-CM | POA: Diagnosis not present

## 2014-11-19 DIAGNOSIS — I480 Paroxysmal atrial fibrillation: Secondary | ICD-10-CM | POA: Diagnosis not present

## 2014-11-19 DIAGNOSIS — I2510 Atherosclerotic heart disease of native coronary artery without angina pectoris: Secondary | ICD-10-CM | POA: Diagnosis not present

## 2014-11-19 DIAGNOSIS — R296 Repeated falls: Secondary | ICD-10-CM | POA: Diagnosis not present

## 2014-11-19 DIAGNOSIS — I10 Essential (primary) hypertension: Secondary | ICD-10-CM | POA: Diagnosis not present

## 2014-11-19 LAB — URINE CULTURE: Culture: 2000

## 2014-11-21 ENCOUNTER — Ambulatory Visit: Payer: Self-pay | Admitting: Rehabilitation

## 2014-11-22 DIAGNOSIS — G9389 Other specified disorders of brain: Secondary | ICD-10-CM | POA: Diagnosis not present

## 2014-11-22 DIAGNOSIS — S0990XA Unspecified injury of head, initial encounter: Secondary | ICD-10-CM | POA: Diagnosis not present

## 2014-11-26 ENCOUNTER — Ambulatory Visit: Payer: Medicare Other | Attending: Neurology | Admitting: Rehabilitation

## 2014-11-26 ENCOUNTER — Encounter: Payer: Self-pay | Admitting: Rehabilitation

## 2014-11-26 DIAGNOSIS — R2681 Unsteadiness on feet: Secondary | ICD-10-CM | POA: Insufficient documentation

## 2014-11-26 DIAGNOSIS — R269 Unspecified abnormalities of gait and mobility: Secondary | ICD-10-CM | POA: Insufficient documentation

## 2014-11-26 DIAGNOSIS — R42 Dizziness and giddiness: Secondary | ICD-10-CM | POA: Diagnosis not present
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
## 2014-11-26 NOTE — Patient Instructions (Signed)
Tip Card 1.The goal of habituation training is to assist in decreasing symptoms of vertigo, dizziness, or nausea provoked by specific head and body motions. 2.These exercises may initially increase symptoms; however, be persistent and work through symptoms. With repetition and time, the exercises will assist in reducing or eliminating symptoms. 3.Exercises should be stopped and discussed with the therapist if you experience any of the following: - Sudden change or fluctuation in hearing - New onset of ringing in the ears, or increase in current intensity - Any fluid discharge from the ear - Severe pain in neck or back - Extreme nausea  Copyright  VHI. All rights reserved.  Sit to Side-Lying   Sit on edge of couch. Lie down onto the right side and hold until dizziness stops, plus 20 seconds.  Return to sitting and wait until dizziness stops, plus 20 seconds.  Repeat sequence 5 times per session. Do 2 sessions per day.  Copyright  VHI. All rights reserved.  Gaze Stabilization: Tip Card 1.Target must remain in focus, not blurry, and appear stationary while head is in motion. 2.Perform exercises with small head movements (45 to either side of midline). 3.Increase speed of head motion so long as target is in focus. 4.If you wear eyeglasses, be sure you can see target through lens (therapist will give specific instructions for bifocal / progressive lenses). 5.These exercises may provoke dizziness or nausea. Work through these symptoms. If too dizzy, slow head movement slightly. Rest between each exercise. 6.Exercises demand concentration; avoid distractions. 7.For safety, perform standing exercises close to a counter, wall, corner, or next to someone.

## 2014-11-28 ENCOUNTER — Encounter: Payer: Self-pay | Admitting: Rehabilitation

## 2014-11-28 ENCOUNTER — Ambulatory Visit: Payer: Medicare Other | Admitting: Rehabilitation

## 2014-11-28 DIAGNOSIS — R2681 Unsteadiness on feet: Secondary | ICD-10-CM

## 2014-11-28 DIAGNOSIS — R42 Dizziness and giddiness: Secondary | ICD-10-CM | POA: Diagnosis not present

## 2014-11-28 DIAGNOSIS — R269 Unspecified abnormalities of gait and mobility: Secondary | ICD-10-CM | POA: Diagnosis not present
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
## 2014-11-28 NOTE — Patient Instructions (Signed)
Tip Card 1.The goal of habituation training is to assist in decreasing symptoms of vertigo, dizziness, or nausea provoked by specific head and body motions. 2.These exercises may initially increase symptoms; however, be persistent and work through symptoms. With repetition and time, the exercises will assist in reducing or eliminating symptoms. 3.Exercises should be stopped and discussed with the therapist if you experience any of the following: - Sudden change or fluctuation in hearing - New onset of ringing in the ears, or increase in current intensity - Any fluid discharge from the ear - Severe pain in neck or back - Extreme nausea  Copyright  VHI. All rights reserved.  Rolling   With pillow under head, start on back. Roll to your right side.  Hold until dizziness stops, plus 20 seconds and then roll to the left side.  If dizziness does not stop, go ahead and roll to the other side after 2 mins.  Hold until dizziness stops, plus 20 seconds.  Again, if dizziness does not stop, go ahead and roll to other side in 2 mins.  Repeat sequence 5 times per session. Do 2 sessions per day.  Make sure your head of bed is flat, but use pillows as you need them and can use rails to help yourself roll.  Have husband support your neck as needed.

## 2014-12-03 ENCOUNTER — Encounter: Payer: Self-pay | Admitting: Rehabilitation

## 2014-12-03 ENCOUNTER — Ambulatory Visit: Payer: Medicare Other | Admitting: Rehabilitation

## 2014-12-03 DIAGNOSIS — R42 Dizziness and giddiness: Secondary | ICD-10-CM | POA: Diagnosis not present

## 2014-12-03 DIAGNOSIS — R2681 Unsteadiness on feet: Secondary | ICD-10-CM

## 2014-12-03 DIAGNOSIS — R269 Unspecified abnormalities of gait and mobility: Secondary | ICD-10-CM

## 2014-12-03 NOTE — Patient Instructions (Signed)
Tip Card 1.The goal of habituation training is to assist in decreasing symptoms of vertigo, dizziness, or nausea provoked by specific head and body motions. 2.These exercises may initially increase symptoms; however, be persistent and work through symptoms. With repetition and time, the exercises will assist in reducing or eliminating symptoms. 3.Exercises should be stopped and discussed with the therapist if you experience any of the following: - Sudden change or fluctuation in hearing - New onset of ringing in the ears, or increase in current intensity - Any fluid discharge from the ear - Severe pain in neck or back - Extreme nausea  Copyright  VHI. All rights reserved.  Rolling   With pillow under head, start on back. Roll to your right side.  Hold until dizziness stops, plus 20 seconds and then roll to the left side.  Hold until dizziness stops, plus 20 seconds.  If the dizziness lasts for a long time, go ahead and roll again after 2 mins.  Repeat sequence 5 times per session. Do 2 sessions per day.  Copyright  VHI. All rights reserved.
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
## 2014-12-05 ENCOUNTER — Ambulatory Visit: Payer: Self-pay | Admitting: Rehabilitation

## 2014-12-10 ENCOUNTER — Ambulatory Visit: Payer: Medicare Other | Admitting: Rehabilitation

## 2014-12-12 ENCOUNTER — Encounter: Payer: Self-pay | Admitting: Rehabilitation

## 2014-12-12 ENCOUNTER — Ambulatory Visit: Payer: Medicare Other | Admitting: Rehabilitation

## 2014-12-12 DIAGNOSIS — R2681 Unsteadiness on feet: Secondary | ICD-10-CM

## 2014-12-12 DIAGNOSIS — R269 Unspecified abnormalities of gait and mobility: Secondary | ICD-10-CM

## 2014-12-12 DIAGNOSIS — R42 Dizziness and giddiness: Secondary | ICD-10-CM | POA: Diagnosis not present

## 2014-12-12 NOTE — Patient Instructions (Signed)
AROM: Lateral Neck Flexion    Slowly tilt head toward your right shoulder. Hold each position _60___ seconds. Repeat _2___ times per set. Do __2__ sets per session. Do _2___ sessions per day.  http://orth.exer.us/297   Copyright  VHI. All rights reserved.   AROM: Neck Rotation    Turn head slowly to look over left shoulder60. Hold each position __60__ seconds. Repeat __2__ times per set. Do __2__ sets per session. Do _2___ sessions per day.  http://orth.exer.us/295   Copyright  VHI. All rights reserved.   Tip Card 1.The goal of habituation training is to assist in decreasing symptoms of vertigo, dizziness, or nausea provoked by specific head and body motions. 2.These exercises may initially increase symptoms; however, be persistent and work through symptoms. With repetition and time, the exercises will assist in reducing or eliminating symptoms. 3.Exercises should be stopped and discussed with the therapist if you experience any of the following: - Sudden change or fluctuation in hearing - New onset of ringing in the ears, or increase in current intensity - Any fluid discharge from the ear - Severe pain in neck or back - Extreme nausea   Copyright  VHI. All rights reserved.  Sit to Side-Lying   Sit on edge of bed. Lie down onto the right side and hold until dizziness stops, plus 20 seconds.  Return to sitting and wait until dizziness stops, plus 20 seconds.  Repeat to the left side. Repeat sequence 5 times per session. Do 2 sessions per day.
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
## 2014-12-18 ENCOUNTER — Ambulatory Visit: Payer: Medicare Other | Admitting: Rehabilitative and Restorative Service Providers"

## 2014-12-20 ENCOUNTER — Ambulatory Visit: Payer: Medicare Other | Attending: Neurology | Admitting: Rehabilitation

## 2014-12-20 DIAGNOSIS — R2681 Unsteadiness on feet: Secondary | ICD-10-CM

## 2014-12-20 DIAGNOSIS — R531 Weakness: Secondary | ICD-10-CM | POA: Insufficient documentation

## 2014-12-20 DIAGNOSIS — R269 Unspecified abnormalities of gait and mobility: Secondary | ICD-10-CM | POA: Diagnosis not present

## 2014-12-20 DIAGNOSIS — R29898 Other symptoms and signs involving the musculoskeletal system: Secondary | ICD-10-CM | POA: Diagnosis not present
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
## 2014-12-20 NOTE — Patient Instructions (Addendum)
   Supine Cervical Retraction into Towel Roll  Patient tucks their chin and pulls their neck back into the towel.  Hold this for 5 counts before relaxing.  Do 10 times, rest then do 10 more times.  Do this twice a day if you can.   Also try when you are lying on a single pillow to turn and lie on your right side so that the left side of your neck can stretch.  Again, should be only slightly uncomfortable, not painful so add padding as you need.  Hold this for 60 secs then maybe roll to your back to relax, then go back to R side and hold another 60 secs.

## 2014-12-21 ENCOUNTER — Other Ambulatory Visit: Payer: Self-pay | Admitting: Neurology

## 2014-12-21 ENCOUNTER — Telehealth: Payer: Self-pay | Admitting: Rehabilitation

## 2014-12-21 ENCOUNTER — Ambulatory Visit: Payer: Medicare Other | Admitting: Rehabilitation

## 2014-12-21 ENCOUNTER — Encounter: Payer: Self-pay | Admitting: Rehabilitation

## 2014-12-21 DIAGNOSIS — R29898 Other symptoms and signs involving the musculoskeletal system: Secondary | ICD-10-CM

## 2014-12-21 DIAGNOSIS — R2681 Unsteadiness on feet: Secondary | ICD-10-CM | POA: Diagnosis not present

## 2014-12-21 DIAGNOSIS — R269 Unspecified abnormalities of gait and mobility: Secondary | ICD-10-CM | POA: Diagnosis not present

## 2014-12-21 DIAGNOSIS — R531 Weakness: Secondary | ICD-10-CM | POA: Diagnosis not present
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
## 2014-12-24 ENCOUNTER — Ambulatory Visit: Payer: Medicare Other | Admitting: Rehabilitative and Restorative Service Providers"

## 2014-12-26 ENCOUNTER — Ambulatory Visit: Payer: Medicare Other | Admitting: Rehabilitative and Restorative Service Providers"

## 2014-12-26 DIAGNOSIS — R531 Weakness: Secondary | ICD-10-CM | POA: Diagnosis not present

## 2014-12-26 DIAGNOSIS — R29898 Other symptoms and signs involving the musculoskeletal system: Secondary | ICD-10-CM

## 2014-12-26 DIAGNOSIS — R2681 Unsteadiness on feet: Secondary | ICD-10-CM | POA: Diagnosis not present

## 2014-12-26 DIAGNOSIS — R269 Unspecified abnormalities of gait and mobility: Secondary | ICD-10-CM | POA: Diagnosis not present
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
## 2014-12-31 ENCOUNTER — Ambulatory Visit: Payer: Self-pay | Admitting: Rehabilitation

## 2015-01-02 ENCOUNTER — Ambulatory Visit: Payer: Self-pay | Admitting: Rehabilitation

## 2015-01-14 ENCOUNTER — Ambulatory Visit: Payer: Self-pay | Admitting: Rehabilitation

## 2015-01-15 ENCOUNTER — Encounter: Payer: Self-pay | Admitting: Rehabilitation

## 2015-01-15 ENCOUNTER — Ambulatory Visit: Payer: Medicare Other | Admitting: Rehabilitation

## 2015-01-15 DIAGNOSIS — R269 Unspecified abnormalities of gait and mobility: Secondary | ICD-10-CM | POA: Diagnosis not present

## 2015-01-15 DIAGNOSIS — R29898 Other symptoms and signs involving the musculoskeletal system: Secondary | ICD-10-CM | POA: Diagnosis not present

## 2015-01-15 DIAGNOSIS — R531 Weakness: Secondary | ICD-10-CM

## 2015-01-15 DIAGNOSIS — R2681 Unsteadiness on feet: Secondary | ICD-10-CM

## 2015-01-15 NOTE — Patient Instructions (Signed)
   I want you to lie on your R side (do not place your arm under your head as above, just allow your head to slowly lower to the bed).  You can start with the towel roll under your head if you need to and then slowly work down towards the bed.  See if you can hold in this position for 5 mins.  Then roll to your back to relax and do one more set of 5 mins.  Do this 2 times a day.
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
## 2015-01-16 ENCOUNTER — Ambulatory Visit: Payer: Self-pay | Admitting: Rehabilitation

## 2015-01-17 ENCOUNTER — Ambulatory Visit: Payer: Medicare Other | Attending: Neurology | Admitting: Rehabilitative and Restorative Service Providers"

## 2015-01-17 DIAGNOSIS — R531 Weakness: Secondary | ICD-10-CM | POA: Insufficient documentation

## 2015-01-17 DIAGNOSIS — R269 Unspecified abnormalities of gait and mobility: Secondary | ICD-10-CM | POA: Diagnosis not present

## 2015-01-17 DIAGNOSIS — R29898 Other symptoms and signs involving the musculoskeletal system: Secondary | ICD-10-CM | POA: Insufficient documentation

## 2015-01-17 DIAGNOSIS — R42 Dizziness and giddiness: Secondary | ICD-10-CM | POA: Diagnosis not present
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
## 2015-01-29 ENCOUNTER — Ambulatory Visit: Payer: Medicare Other | Admitting: Rehabilitation

## 2015-01-30 ENCOUNTER — Ambulatory Visit (INDEPENDENT_AMBULATORY_CARE_PROVIDER_SITE_OTHER): Payer: Medicare Other | Admitting: Neurology

## 2015-01-30 ENCOUNTER — Encounter: Payer: Self-pay | Admitting: Neurology

## 2015-01-30 VITALS — BP 86/48 | HR 76 | Ht 64.0 in | Wt 144.0 lb

## 2015-01-30 DIAGNOSIS — R29898 Other symptoms and signs involving the musculoskeletal system: Secondary | ICD-10-CM

## 2015-01-30 DIAGNOSIS — R269 Unspecified abnormalities of gait and mobility: Secondary | ICD-10-CM | POA: Diagnosis not present

## 2015-01-30 HISTORY — DX: Unspecified abnormalities of gait and mobility: R26.9

## 2015-01-30 NOTE — Progress Notes (Signed)
Reason for visit: Dropped head syndrome  Victoria Fisher is an 80 y.o. female  History of present illness:  Victoria Fisher is a 80 year old right-handed white female with a history of a fall that occurred in April 2015. Within several weeks following the fall, the patient developed problems with flexion of the neck, and tilting of the head to the left. The patient has undergone EMG evaluation that suggested myopathic changes in the cervical paraspinal muscles. The patient has not responded to prednisone, she has not responded to physical therapy. There appears to be an actual physical restriction of neck movement, the patient is not able to extend the neck or to bring the head to a neutral position or to turn the head to the left even with passive head movement. According to the patient, if she lies down flat on the bed, after 10 minutes, the neck will seem to straighten, and the head actually lays back on the mattress eventually. This is uncomfortable for her. She has undergone MRI evaluation of the cervical spine following the initial fall, this did not show any structural changes in the neck. The patient does have a gait disturbance, she uses a walker for ambulation. She has recently fallen out of bed. She returns this office for an evaluation. She is using a cervical collar, but this does not help much.  Past Medical History  Diagnosis Date  . Anxiety   . Arthritis   . COPD (chronic obstructive pulmonary disease) ([place])   . Emphysema of lung ([place])   . Tuberculosis   . MI (myocardial infarction) ([place]) 1999    Unstable angina  . Lung nodule   . Diverticulosis of colon   . Depression   . Milroy's disease     Lymphatic disorder diagnosed at [facility]  . Chronic anticoagulation     A fib. On Coumadin.  . IBS (irritable bowel syndrome)   . Edema of both legs     Venous Doppler 05/11/12 was normal & no evidence of thrombus or thrombophlebitis. Chronic LE edema related to both cor pulmonale &  lymphedema, hyperlipidemia & HTN involving coronary atherosclerosis by cardiac cath in 2004. Cath showed a tiny old occlusion of the optional diagonal branch & also proximal LAD.  [name] Chronic pain syndrome   . Hyperlipidemia     On a statin. Catheterization 02/12/03 Showed a tiny old occlusion of the optional diagonal branch & also proximal LAD.  [name] Hypertension     Myoview 07/2011 EF = >55%. RV was moderately dilated. LA was severely dilated. Catheterization 02/12/03 Showed a tiny old occlusion of the optional diagonal branch & also proximal LAD.  [name] Coronary artery disease   . Coronary atherosclerosis     Mild. Myoview 07/2011 EF = >55%. RV was moderately dilated. LA was severely dilated. Catheterization 02/12/03 Showed a tiny old occlusion of the optional diagonal branch & also proximal LAD.  [name] Chronic cor pulmonale (HCC)     Most likely due to COPD (cannot rule out contribution of diastolic LV dysfunction). ECHO 07/30/11 pulmonary artery pressure was estimated to be 50-60% mmHg & there was moderate tricuspid insufficiency.  . Frequent falls     Possibly due to orthostatic hypotension but we have not seen that on our visits. Recent fall 05/2012 with scalp hematoma, swollen left LE, & black eyes.  . Obstructive sleep apnea     Polysomnogram 09/25/11 AHI: 53.6/hr overall & 57.1/hr during REM sleep. Poor compliance previously with CPAP  . Pulmonary  hypertension ([place])     Pullmonary hypertension, chronic cor pulmonale w/pulmonary artery pressures of 50&#x2011; 60 mmHg.  . CHF (congestive heart failure) (HCC)     Acute on chronic, diastolic, improved. Predominantly right heart failure due to cor pulmonale & in turn this is most likely due to untreated OSA.  [name] Dyspnea on exertion     Reluctant to take diuretics  . Atrial fibrillation, chronic (HCC)     On Coumadin & a beta-blocker  . Cancer [facility])     Carcinoma of sigmoid colon [place].  [name] Bowel obstruction (HCC)     Caused by scar tissue  .  Hypothyroidism 2002    On hormone replacement therapy  . Asthma     On nebulizer  . Insomnia   . 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 Abnormality of gait 01/30/2015    Past Surgical History  Procedure Laterality Date  . Appendectomy  1956  . Abdominal hysterectomy  1983  . Colon resection      2 times  . Cholecystectomy  1984  . Colon biopsy    . Cardiac catheterization  02/12/03    Showed a tiny old occlusion of the optional diagonal branch & also proximal LAD.  [name] Colonoscopy  07/23/10    Family History  Problem Relation Age of Onset  . Heart disease Father   . Alzheimer's disease Mother   . Parkinsonism Brother   . Diabetes Brother     Social history:  reports that she has never smoked. She has never used smokeless tobacco. She reports that she does not drink alcohol or use illicit drugs.    Allergies  Allergen Reactions  . Codeine Anaphylaxis, Hives and Nausea And Vomiting  . Ciprofloxacin Hives and Nausea And Vomiting  . Latex Hives and Rash  . Penicillins Hives    Has patient had a PCN reaction causing immediate rash, facial/tongue/throat swelling, SOB or lightheadedness with hypotension: Yes Has patient had a PCN reaction causing severe rash involving mucus membranes or skin necrosis: Yes Has patient had a PCN reaction that required hospitalization Yes Has patient had a PCN reaction occurring within the last 10 years: Yes If all of the above answers are "NO", then may proceed with Cephalosporin use.  . Sulfonamide Derivatives Hives and Rash    Medications:  Prior to Admission medications   Medication Sig Start Date End Date Taking? Authorizing Provider  aspirin EC 81 MG tablet Take 81 mg by mouth every morning.    Yes Historical Provider, MD  atorvastatin (LIPITOR) 40 MG tablet Take 40 mg by mouth daily.   Yes Historical Provider, MD  cholecalciferol (VITAMIN D) 1000 UNITS tablet Take 1,000 Units by mouth daily.   Yes Historical Provider, MD  levalbuterol  (XOPENEX HFA) 45 MCG/ACT inhaler Inhale 1 puff into the lungs every 4 (four) hours as needed for wheezing.   Yes Historical Provider, MD  levothyroxine (SYNTHROID, LEVOTHROID) 112 MCG tablet Take 1 tablet (112 mcg total) by mouth every morning. 06/30/13  Yes Mihai Croitoru, MD  lisinopril (PRINIVIL,ZESTRIL) 20 MG tablet Take 20 mg by mouth daily.   Yes Historical Provider, MD  magnesium oxide (MAG-OX) 400 (241.3 MG) MG tablet Take 1 tablet (400 mg total) by mouth daily. 07/11/13  Yes Haywood Pao, MD  metoprolol succinate (TOPROL-XL) 25 MG 24 hr tablet Take 1 tablet (25 mg total) by mouth daily. 07/11/13  Yes Haywood Pao, MD  metoprolol succinate (TOPROL-XL) 50 MG 24 hr tablet Take  50 mg by mouth daily.  08/24/14  Yes Historical Provider, MD  morphine (MS CONTIN) 60 MG 12 hr tablet Take 60 mg by mouth every 12 (twelve) hours.   Yes Historical Provider, MD  morphine (MSIR) 30 MG tablet Take 30 mg by mouth 3 (three) times daily as needed (breakthrough pain).    Yes Historical Provider, MD  nitroGLYCERIN (NITROLINGUAL) 0.4 MG/SPRAY spray Place 1 spray under the tongue every 5 (five) minutes as needed for chest pain. 12/13/12  Yes Mihai Croitoru, MD  potassium chloride (K-DUR,KLOR-CON) 10 MEQ tablet Take 10 mEq by mouth daily.    Yes Historical Provider, MD  sennosides-docusate sodium (SENOKOT-S) 8.6-50 MG tablet Take 2-3 tablets by mouth daily as needed for constipation.   Yes Historical Provider, MD  sertraline (ZOLOFT) 100 MG tablet Take 100 mg by mouth daily.   Yes Historical Provider, MD  torsemide (DEMADEX) 20 MG tablet Take 20-40 mg by mouth 2 (two) times daily. Take 40 mg every morning and 20 mg every evening   Yes Historical Provider, MD  vitamin E 400 UNIT capsule Take 400 Units by mouth daily.   Yes Historical Provider, MD  zolpidem (AMBIEN CR) 6.25 MG CR tablet Take 6.25 mg by mouth at bedtime.   Yes Historical Provider, MD    ROS:  Out of a complete 14 system review of symptoms, the  patient complains only of the following symptoms, and all other reviewed systems are negative.  Fatigue Difficulty swallowing Leg swelling Difficulty walking, neck stiffness Acting out dreams  Blood pressure 86/48, pulse 76, height 5\' 4"  (1.626 m), weight 144 lb (65.318 kg).  Physical Exam  General: The patient is alert and cooperative at the time of the examination.  Neuromuscular: The patient has some flexion of the neck, head is tilted towards the left. She does have some rotational movement of the head to the right, cannot turn to the left. She cannot extend the neck.  Skin: 3+ edema below the knees is noted bilaterally.   Neurologic Exam  Mental status: The patient is alert and oriented x 3 at the time of the examination. The patient has apparent normal recent and remote memory, with an apparently normal attention span and concentration ability.   Cranial nerves: Facial symmetry is present. Speech is normal, no aphasia or dysarthria is noted. Extraocular movements are full. Visual fields are full.  Motor: The patient has good strength in all 4 extremities.  Sensory examination: Soft touch sensation is symmetric on the face, arms, and legs.  Coordination: The patient has good finger-nose-finger and heel-to-shin bilaterally.  Gait and station: The patient has a slightly wide-based, unsteady gait. Tandem gait is unsteady. Romberg is negative. The patient normally walks with a walker.  Reflexes: Deep tendon reflexes are symmetric.   Assessment/Plan:   1. Dropped head syndrome  2. Gait disorder  The patient has not responded to medical therapy or to physical therapy. The syndrome is a bit unusual for dropped head syndrome in that she has some torsion of the headache to the left, she has physical restriction of extension and with rotational movement to the left. I would wonder if there is a component of dystonia, torticollis. I will consider a referral for possible Botox  therapy for this issue.  I have discussed this case with Dr. Krista Blue, I will make a referral for possible Botox therapy.  Jill Alexanders MD 01/30/2015 7:09 PM  [facility] [address] [place], [place] [address]  Phone [phone] Fax [phone]  

## 2015-01-30 NOTE — Patient Instructions (Signed)
Fall Prevention in the Home  Falls can cause injuries and can affect people from all age groups. There are many simple things that you can do to make your home safe and to help prevent falls. WHAT CAN I DO ON THE OUTSIDE OF MY HOME?  Regularly repair the edges of walkways and driveways and fix any cracks.  Remove high doorway thresholds.  Trim any shrubbery on the main path into your home.  Use bright outdoor lighting.  Clear walkways of debris and clutter, including tools and rocks.  Regularly check that handrails are securely fastened and in good repair. Both sides of any steps should have handrails.  Install guardrails along the edges of any raised decks or porches.  Have leaves, snow, and ice cleared regularly.  Use sand or salt on walkways during winter months.  In the garage, clean up any spills right away, including grease or oil spills. WHAT CAN I DO IN THE BATHROOM?  Use night lights.  Install grab bars by the toilet and in the tub and shower. Do not use towel bars as grab bars.  Use non-skid mats or decals on the floor of the tub or shower.  If you need to sit down while you are in the shower, use a plastic, non-slip stool..  Keep the floor dry. Immediately clean up any water that spills on the floor.  Remove soap buildup in the tub or shower on a regular basis.  Attach bath mats securely with double-sided non-slip rug tape.  Remove throw rugs and other tripping hazards from the floor. WHAT CAN I DO IN THE BEDROOM?  Use night lights.  Make sure that a bedside light is easy to reach.  Do not use oversized bedding that drapes onto the floor.  Have a firm chair that has side arms to use for getting dressed.  Remove throw rugs and other tripping hazards from the floor. WHAT CAN I DO IN THE KITCHEN?   Clean up any spills right away.  Avoid walking on wet floors.  Place frequently used items in easy-to-reach places.  If you need to reach for something  above you, use a sturdy step stool that has a grab bar.  Keep electrical cables out of the way.  Do not use floor polish or wax that makes floors slippery. If you have to use wax, make sure that it is non-skid floor wax.  Remove throw rugs and other tripping hazards from the floor. WHAT CAN I DO IN THE STAIRWAYS?  Do not leave any items on the stairs.  Make sure that there are handrails on both sides of the stairs. Fix handrails that are broken or loose. Make sure that handrails are as long as the stairways.  Check any carpeting to make sure that it is firmly attached to the stairs. Fix any carpet that is loose or worn.  Avoid having throw rugs at the top or bottom of stairways, or secure the rugs with carpet tape to prevent them from moving.  Make sure that you have a light switch at the top of the stairs and the bottom of the stairs. If you do not have them, have them installed. WHAT ARE SOME OTHER FALL PREVENTION TIPS?  Wear closed-toe shoes that fit well and support your feet. Wear shoes that have rubber soles or low heels.  When you use a stepladder, make sure that it is completely opened and that the sides are firmly locked. Have someone hold the ladder while you   are using it. Do not climb a closed stepladder.  Add color or contrast paint or tape to grab bars and handrails in your home. Place contrasting color strips on the first and last steps.  Use mobility aids as needed, such as canes, walkers, scooters, and crutches.  Turn on lights if it is dark. Replace any light bulbs that burn out.  Set up furniture so that there are clear paths. Keep the furniture in the same spot.  Fix any uneven floor surfaces.  Choose a carpet design that does not hide the edge of steps of a stairway.  Be aware of any and all pets.  Review your medicines with your healthcare provider. Some medicines can cause dizziness or changes in blood pressure, which increase your risk of falling. Talk  with your health care provider about other ways that you can decrease your risk of falls. This may include working with a physical therapist or trainer to improve your strength, balance, and endurance.   This information is not intended to replace advice given to you by your health care provider. Make sure you discuss any questions you have with your health care provider.   Document Released: 01/23/2002 Document Revised: 06/19/2014 Document Reviewed: 03/09/2014 Elsevier Interactive Patient Education 2016 [employer] Inc.  

## 2015-02-05 ENCOUNTER — Ambulatory Visit: Payer: Medicare Other | Admitting: Rehabilitative and Restorative Service Providers"

## 2015-02-07 ENCOUNTER — Ambulatory Visit: Payer: Medicare Other | Admitting: Rehabilitation

## 2015-02-12 ENCOUNTER — Ambulatory Visit: Payer: Medicare Other | Admitting: Rehabilitation

## 2015-02-14 ENCOUNTER — Ambulatory Visit: Payer: Self-pay | Admitting: Rehabilitation

## 2015-02-19 ENCOUNTER — Ambulatory Visit: Payer: Self-pay | Admitting: Rehabilitation

## 2015-02-21 ENCOUNTER — Ambulatory Visit: Payer: Self-pay | Admitting: Rehabilitation

## 2015-02-28 ENCOUNTER — Ambulatory Visit (INDEPENDENT_AMBULATORY_CARE_PROVIDER_SITE_OTHER): Payer: Medicare Other | Admitting: Neurology

## 2015-02-28 ENCOUNTER — Encounter: Payer: Self-pay | Admitting: Neurology

## 2015-02-28 VITALS — BP 93/59 | HR 65 | Resp 14 | Ht 64.0 in | Wt 145.0 lb

## 2015-02-28 DIAGNOSIS — G243 Spasmodic torticollis: Secondary | ICD-10-CM | POA: Diagnosis not present

## 2015-02-28 NOTE — Progress Notes (Signed)
PATIENT: Victoria Fisher DOB: 05-02-1934  Chief Complaint  Patient presents with  . Dropped Head Syndrome    Rm 4. Patient is here to discuss possibly starting Botox injections.      HISTORICAL  Victoria Fisher, is a 81 years old right-handed female, accompanied by her husband, seen in referral by Dr. Jannifer Franklin for evaluation of possible EMG guided botulism toxin injection for her abnormal neck posturing, her primary care physician is Dr. Domenick Gong.  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  She fell in 2015, in attempt to avoid a glass table while falling, she jerked her neck quickly to the left side, she felt instant pain, heard cracking sound at the posterior lower neck, she fell face down, hyperextended her neck, ever since the event, she had abnormal neck posturing, difficulty to look up straight her neck, difficulty turning, she felt her neck was pulled towards the left side  Over the years, her pain overall has improved, but she has significant abnormal neck posturing, wearing neck brace to holding her neck, she continue has unsteady gait, tendency to fall, her abnormal neck posturing, bilateral lower extremity swelling pain contributes to her fall  I have personally reviewed MRI scan in 2015, MRI of the brain showed mild atrophy no significant abnormality, MRI of cervical spine No abnormality seen to explain falling and abnormal reflexes. Ordinary mid cervical spondylosis without canal stenosis, cord compression or cord  lesion. Degenerative spondylosis at the C5-6 level is chronic, and associated with sclerosis and marrow edema.  REVIEW OF SYSTEMS: Full 14 system review of systems performed and notable only for as above   ALLERGIES: Allergies  Allergen Reactions  . Codeine Anaphylaxis, Hives and Nausea And Vomiting  . Ciprofloxacin Hives and Nausea And Vomiting  . Latex Hives and Rash  . Penicillins Hives    Has patient had a PCN reaction causing immediate rash, facial/tongue/throat swelling, SOB or lightheadedness with hypotension: Yes Has patient had a PCN reaction causing severe rash involving mucus membranes or skin necrosis: Yes Has patient had a PCN reaction that required hospitalization Yes Has patient had a PCN reaction occurring within the last 10 years: Yes If all of the above answers are "NO", then may proceed with Cephalosporin use.  . Sulfonamide Derivatives Hives and Rash    HOME MEDICATIONS: Current Outpatient Prescriptions  Medication Sig Dispense Refill  . aspirin EC 81 MG tablet Take 81 mg by mouth every morning.     [name] atorvastatin (LIPITOR) 40 MG tablet Take 40 mg by mouth daily.    . cholecalciferol (VITAMIN D) 1000 UNITS tablet Take 1,000 Units by mouth daily.    . ferrous sulfate 325 (65 FE) MG tablet TK 1 T PO ONCE D  3  . levalbuterol (XOPENEX HFA) 45 MCG/ACT inhaler Inhale 1 puff into the lungs every 4 (four) hours as needed for wheezing.    [name] levothyroxine (SYNTHROID, LEVOTHROID) 112 MCG tablet Take 1 tablet (112 mcg total) by mouth every morning. 30 tablet 6  . lisinopril (PRINIVIL,ZESTRIL) 20 MG tablet Take 20  mg by mouth daily.    . magnesium oxide (MAG-OX) 400 (241.3 MG) MG tablet Take 1 tablet (400 mg total) by mouth daily. 30 tablet 5  . metoprolol succinate (TOPROL-XL) 25 MG 24 hr tablet Take 1 tablet (25 mg total) by mouth daily. 30 tablet 5  . metoprolol succinate (TOPROL-XL) 50 MG 24 hr tablet Take 50 mg by mouth daily.     [name] morphine (MS CONTIN) 60 MG 12 hr tablet  Take 60 mg by mouth every 12 (twelve) hours.    [name] morphine (MSIR) 30 MG tablet Take 30 mg by mouth 3 (three) times daily as needed (breakthrough pain).     . nitroGLYCERIN (NITROLINGUAL) 0.4 MG/SPRAY spray Place 1 spray under the tongue every 5 (five) minutes as needed for chest pain. 12 g 1  . potassium chloride (K-DUR,KLOR-CON) 10 MEQ tablet Take 10 mEq by mouth daily.     . sennosides-docusate sodium (SENOKOT-S) 8.6-50 MG tablet Take 2-3 tablets by mouth daily as needed for constipation.    . sertraline (ZOLOFT) 100 MG tablet Take 100 mg by mouth daily.    [name] torsemide (DEMADEX) 20 MG tablet Take 20-40 mg by mouth 2 (two) times daily. Take 40 mg every morning and 20 mg every evening    . vitamin E 400 UNIT capsule Take 400 Units by mouth daily.    [name] zolpidem (AMBIEN CR) 6.25 MG CR tablet Take 6.25 mg by mouth at bedtime.     No current facility-administered medications for this visit.    PAST MEDICAL HISTORY: Past Medical History  Diagnosis Date  . Anxiety   . Arthritis   . COPD (chronic obstructive pulmonary disease) ([place])   . Emphysema of lung ([place])   . Tuberculosis   . MI (myocardial infarction) ([place]) 1999    Unstable angina  . Lung nodule   . Diverticulosis of colon   . Depression   . Milroy's disease     Lymphatic [facility] at [facility]  . Chronic anticoagulation     A fib. On Coumadin.  . IBS (irritable bowel syndrome)   . Edema of both legs     Venous Doppler 05/11/12 was normal & no evidence of thrombus or thrombophlebitis. Chronic LE edema related to both cor pulmonale & lymphedema, hyperlipidemia & HTN involving coronary atherosclerosis by cardiac cath in 2004. Cath showed a tiny old occlusion of the optional diagonal branch & also proximal LAD.  [name] Chronic pain syndrome   . Hyperlipidemia     On a statin. Catheterization 02/12/03 Showed a tiny old occlusion of the optional diagonal branch & also proximal LAD.  [name] Hypertension     Myoview 07/2011 EF = >55%. RV was  moderately dilated. LA was severely dilated. Catheterization 02/12/03 Showed a tiny old occlusion of the optional diagonal branch & also proximal LAD.  [name] Coronary artery disease   . Coronary atherosclerosis     Mild. Myoview 07/2011 EF = >55%. RV was moderately dilated. LA was severely dilated. Catheterization 02/12/03 Showed a tiny old occlusion of the optional diagonal branch & also proximal LAD.  [name] Chronic cor pulmonale (HCC)     Most likely due to COPD (cannot rule out contribution of diastolic LV dysfunction). ECHO 07/30/11 pulmonary artery pressure was estimated to be 50-60% mmHg & there was moderate tricuspid insufficiency.  . Frequent falls     Possibly due to orthostatic hypotension but we have not seen that on our visits. Recent fall 05/2012 with scalp hematoma, swollen  left LE, & black eyes.  . Obstructive sleep apnea     Polysomnogram 09/25/11 AHI: 53.6/hr overall & 57.1/hr during REM sleep. Poor compliance previously with CPAP  . Pulmonary hypertension ([place])     Pullmonary hypertension, chronic cor pulmonale w/pulmonary artery pressures of 50&#x2011; 60 mmHg.  . CHF (congestive heart failure) (HCC)     Acute on chronic, diastolic, improved. Predominantly right heart failure due to cor pulmonale & in turn this is most likely due to untreated OSA.  [name] Dyspnea on exertion     Reluctant to take diuretics  . Atrial fibrillation, chronic (HCC)     On Coumadin & a beta-blocker  . Cancer [facility])     Carcinoma of sigmoid colon [place].  [name] Bowel obstruction (HCC)     Caused by scar tissue  . Hypothyroidism 2002    On hormone replacement therapy  . Asthma     On nebulizer  . Insomnia   . 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 Abnormality of gait 01/30/2015    PAST SURGICAL HISTORY: Past Surgical History  Procedure Laterality Date  . Appendectomy  1956  . Abdominal hysterectomy  1983  . Colon resection      2 times  . Cholecystectomy  1984  . Colon biopsy    . Cardiac  catheterization  02/12/03    Showed a tiny old occlusion of the optional diagonal branch & also proximal LAD.  [name] Colonoscopy  07/23/10    FAMILY HISTORY: Family History  Problem Relation Age of Onset  . Heart disease Father   . Alzheimer's disease Mother   . Parkinsonism Brother   . Diabetes Brother     SOCIAL HISTORY:  Social History   Social History  . Marital Status: Married    Spouse Name: Georgiann Mccoy  . Number of Children: 3  . Years of Education: HS   Occupational History  .     Social History Main Topics  . Smoking status: Never Smoker   . Smokeless tobacco: Never Used  . Alcohol Use: No  . Drug Use: No  . Sexual Activity: No   Other Topics Concern  . Not on file   Social History Narrative   Patient is married Georgiann Mccoy) and lives at home with her husband.   Patient is retired.   Patient has three children and her husband has two children.   Patient is right-handed.   Patient drinks very little caffeine (1/2 cup daily)   Patient has a high school education.     PHYSICAL EXAM   Filed Vitals:   02/28/15 1328  BP: 93/59  Pulse: 65  Resp: 14  Height: 5\' 4"  (1.626 m)  Weight: 145 lb (65.772 kg)    Not recorded      Body mass index is 24.88 kg/(m^2).  PHYSICAL EXAMNIATION:  Gen: NAD, conversant, well nourised, obese, well groomed                     Cardiovascular: Regular rate rhythm, no peripheral edema, warm, nontender. Eyes: Conjunctivae clear without exudates or hemorrhage Neck: Supple, no carotid bruise. Pulmonary: Clear to auscultation bilaterally   NEUROLOGICAL EXAM:  MENTAL STATUS: Speech:    Speech is normal; fluent and spontaneous with normal comprehension.  Cognition:     Orientation to time, place and person     Normal recent and remote memory     Normal Attention span and concentration     Normal Language, naming, repeating,spontaneous speech  Fund of knowledge   CRANIAL NERVES: CN II: Visual fields are full to  confrontation. Fundoscopic exam is normal with sharp discs and no vascular changes. Pupils are round equal and briskly reactive to light. CN III, IV, VI: extraocular movement are normal. No ptosis. CN V: Facial sensation is intact to pinprick in all 3 divisions bilaterally. Corneal responses are intact.  CN VII: Face is symmetric with normal eye closure and smile. CN VIII: Hearing is normal to rubbing fingers CN IX, X: Palate elevates symmetrically. Phonation is normal. CN XI: She has limited range of motion of her neck, CN XII: Tongue is midline with normal movements and no atrophy.  MOTOR: She has severe anterocollis, her chin touches her chest, significant turning to the left side and tilt to the left side, left lateral neck muscle hypertrophy There is no pronator drift of out-stretched arms. Muscle bulk and tone are normal. Muscle strength is normal.  REFLEXES: Reflexes are 1  and symmetric at the biceps, triceps, knees, and ankles. Plantar responses are flexor.  SENSORY: Intact to light touch, pinprick, position sense, and vibration sense are intact in fingers and toes.  COORDINATION: Rapid alternating movements and fine finger movements are intact. There is no dysmetria on finger-to-nose and heel-knee-shin.    GAIT/STANCE: Mildly wide based, unsteady  DIAGNOSTIC DATA (LABS, IMAGING, TESTING) - I reviewed patient records, labs, notes, testing and imaging myself where available.   ASSESSMENT AND PLAN  Victoria Fisher is a 81 y.o. female    Cervical dystonia  Significant anterocollis, left turn, left tilt,  She might benefit EMG guided botulism toxin injection, but the muscles that are responsible for anterocollis are located deep in the anterior cervical region.  Start preauthorization process, return for EMG guided botulism toxin injection    Marcial Pacas, M.D. Ph.D.  [facility] [address], [place], [place] [address] Ph: [phone] Fax:  [phone]  CC: Dr. Domenick Gong.

## 2015-03-07 ENCOUNTER — Encounter: Payer: Self-pay | Admitting: Internal Medicine

## 2015-04-10 ENCOUNTER — Encounter: Payer: Self-pay | Admitting: Gastroenterology

## 2015-04-25 DIAGNOSIS — L739 Follicular disorder, unspecified: Secondary | ICD-10-CM | POA: Diagnosis not present

## 2015-04-25 DIAGNOSIS — Z6824 Body mass index (BMI) 24.0-24.9, adult: Secondary | ICD-10-CM | POA: Diagnosis not present

## 2015-04-25 DIAGNOSIS — I480 Paroxysmal atrial fibrillation: Secondary | ICD-10-CM | POA: Diagnosis not present

## 2015-04-25 DIAGNOSIS — W1840XA Slipping, tripping and stumbling without falling, unspecified, initial encounter: Secondary | ICD-10-CM | POA: Diagnosis not present

## 2015-04-25 DIAGNOSIS — M79651 Pain in right thigh: Secondary | ICD-10-CM | POA: Diagnosis not present

## 2015-04-25 DIAGNOSIS — S7011XA Contusion of right thigh, initial encounter: Secondary | ICD-10-CM | POA: Diagnosis not present

## 2015-05-07 ENCOUNTER — Telehealth: Payer: Self-pay | Admitting: *Deleted

## 2015-05-07 ENCOUNTER — Ambulatory Visit: Payer: Self-pay | Admitting: Neurology

## 2015-05-07 DIAGNOSIS — R609 Edema, unspecified: Secondary | ICD-10-CM | POA: Diagnosis not present

## 2015-05-07 DIAGNOSIS — D638 Anemia in other chronic diseases classified elsewhere: Secondary | ICD-10-CM | POA: Diagnosis not present

## 2015-05-07 DIAGNOSIS — I10 Essential (primary) hypertension: Secondary | ICD-10-CM | POA: Diagnosis not present

## 2015-05-07 DIAGNOSIS — N183 Chronic kidney disease, stage 3 (moderate): Secondary | ICD-10-CM | POA: Diagnosis not present

## 2015-05-07 DIAGNOSIS — Z6825 Body mass index (BMI) 25.0-25.9, adult: Secondary | ICD-10-CM | POA: Diagnosis not present

## 2015-05-07 NOTE — Telephone Encounter (Signed)
Pt called to cancel the morning of her appt - not feeling well.

## 2015-05-09 DIAGNOSIS — D638 Anemia in other chronic diseases classified elsewhere: Secondary | ICD-10-CM | POA: Diagnosis not present

## 2015-05-09 DIAGNOSIS — E538 Deficiency of other specified B group vitamins: Secondary | ICD-10-CM | POA: Diagnosis not present

## 2015-05-09 DIAGNOSIS — Z6825 Body mass index (BMI) 25.0-25.9, adult: Secondary | ICD-10-CM | POA: Diagnosis not present

## 2015-05-09 DIAGNOSIS — R609 Edema, unspecified: Secondary | ICD-10-CM | POA: Diagnosis not present

## 2015-05-09 DIAGNOSIS — N183 Chronic kidney disease, stage 3 (moderate): Secondary | ICD-10-CM | POA: Diagnosis not present

## 2015-05-14 DIAGNOSIS — D638 Anemia in other chronic diseases classified elsewhere: Secondary | ICD-10-CM | POA: Diagnosis not present

## 2015-05-14 DIAGNOSIS — M6281 Muscle weakness (generalized): Secondary | ICD-10-CM | POA: Diagnosis not present

## 2015-05-14 DIAGNOSIS — R609 Edema, unspecified: Secondary | ICD-10-CM | POA: Diagnosis not present

## 2015-05-14 DIAGNOSIS — N183 Chronic kidney disease, stage 3 (moderate): Secondary | ICD-10-CM | POA: Diagnosis not present

## 2015-05-14 DIAGNOSIS — I872 Venous insufficiency (chronic) (peripheral): Secondary | ICD-10-CM | POA: Diagnosis not present

## 2015-05-14 DIAGNOSIS — Z6825 Body mass index (BMI) 25.0-25.9, adult: Secondary | ICD-10-CM | POA: Diagnosis not present

## 2015-05-16 ENCOUNTER — Telehealth: Payer: Self-pay | Admitting: Neurology

## 2015-05-16 NOTE — Telephone Encounter (Signed)
Patient is calling to schedule an appointment with Dr. Krista Blue for a Xeomin injection that she missed.

## 2015-05-18 DIAGNOSIS — T148 Other injury of unspecified body region: Secondary | ICD-10-CM | POA: Diagnosis not present

## 2015-05-18 DIAGNOSIS — T1490 Injury, unspecified: Secondary | ICD-10-CM | POA: Diagnosis not present

## 2015-05-20 ENCOUNTER — Encounter: Payer: Self-pay | Admitting: Rehabilitative and Restorative Service Providers"

## 2015-05-20 NOTE — Therapy (Signed)
[name] [address] [place], [place], [address] Phone: [phone]   Fax:  [phone]  Patient Details  Name: JAONNA WORD MRN: [id number] Date of Birth: January 16, 1935 Referring Provider:  No ref. provider found  Encounter Date: last encounter 01/17/2015  PHYSICAL THERAPY DISCHARGE SUMMARY  Visits from Start of Care: 10  Current functional level related to goals / functional outcomes:     PT Short Term Goals - 01/15/15 1211    Additional Short Term Goals   Additional Short Term Goals Yes   PT SHORT TERM GOAL #6   Title Pt will report decreased dizziness with functional bed mobility in order to indicate success with BPPV treatment. (Target Date: 02/12/15)   Status New   PT SHORT TERM GOAL #7   Title Pt will perform and increase BERG balance test by 4 points in order to indicate functional improvement in balance and decreased fall risk.  (Target Date: 02/12/15)   Status New         PT Long Term Goals - 01/15/15 1142    PT LONG TERM GOAL #1   Title Pt will perform all aspects of functional mobility with no more than 2 point increase in pain to indicate pain is less of a limiting factor in mobility. (Modified Target Date: 03/12/15)   Baseline Pt still increasing pain above 7/10 on some days (5/10 is baseline most days)   Time 8   Period Weeks   Status Not Met   PT LONG TERM GOAL #2   Title Pt will increase Neck Disability Index by 10.2 points in order to indicate less pain with functional mobility. (Modified Target Date: 03/12/15)   Baseline did not have time to assess 01/15/15   Time 8   Period Weeks   Status On-going   PT LONG TERM GOAL #3   Title Pt and spouse will be independent in performing HEP for cervical strengthening/stretching and balance to indicate decreased fall risk and increased strength and ROM. (Target Date: 01/08/15)   Baseline met 01/15/15   Time 8   Period Weeks   Status Achieved   PT  LONG TERM GOAL #4   Title Pt will perform TUG in 13.5 secs in order to indicate decreased fall risk and functional improvement in balance. (Modified Target Date: 03/12/15)   Baseline 25.47 secs on 01/15/15 (improvement from 29.94 sec on 12/20/14)   Time 8   Period Weeks   Status Partially Met  made improvements towards goal   PT LONG TERM GOAL #5   Title Pt will perform 8/10 sit<>stand at mod I level without UE use to indicate improvement in BLE strength.  (Modified Target Date: 03/12/15)   Baseline Performed 4/10 without assist and without UE use 01/15/15    Time 8   Period Weeks   Status Not Met        Remaining deficits: PT had held patient due to continued/worsening neck dystonia with postural changes.  Patient awaiting botox.  Would need new order to return to PT. Has HEP.    Education / Equipment: HEP.  Plan: Patient agrees to discharge.  Patient goals were partially met. Patient is being discharged due to                                                     ?????  PT recommended return to neurologist in order to manage neck/postural changes.        Thank you for the referral of this patient. Rudell Cobb, MPT  Joy Reiger 05/20/2015, 11:20 AM  [facility] [address] [place], [place], [address] Phone: [phone]   Fax:  [phone]

## 2015-05-21 DIAGNOSIS — Q820 Hereditary lymphedema: Secondary | ICD-10-CM | POA: Diagnosis not present

## 2015-05-21 DIAGNOSIS — I2510 Atherosclerotic heart disease of native coronary artery without angina pectoris: Secondary | ICD-10-CM | POA: Diagnosis not present

## 2015-05-21 DIAGNOSIS — I872 Venous insufficiency (chronic) (peripheral): Secondary | ICD-10-CM | POA: Diagnosis not present

## 2015-05-21 DIAGNOSIS — N183 Chronic kidney disease, stage 3 (moderate): Secondary | ICD-10-CM | POA: Diagnosis not present

## 2015-05-21 DIAGNOSIS — R609 Edema, unspecified: Secondary | ICD-10-CM | POA: Diagnosis not present

## 2015-05-21 DIAGNOSIS — Z6824 Body mass index (BMI) 24.0-24.9, adult: Secondary | ICD-10-CM | POA: Diagnosis not present

## 2015-05-28 NOTE — Telephone Encounter (Signed)
Patient has been scheduled

## 2015-06-04 ENCOUNTER — Ambulatory Visit: Payer: Self-pay | Admitting: Neurology

## 2015-06-04 ENCOUNTER — Telehealth: Payer: Self-pay | Admitting: Neurology

## 2015-06-04 NOTE — Telephone Encounter (Signed)
Pt called said she was nauseated, loose BM. She requested to r/s appt.

## 2015-06-04 NOTE — Telephone Encounter (Signed)
Called and scheduled patient

## 2015-06-20 DIAGNOSIS — G4700 Insomnia, unspecified: Secondary | ICD-10-CM | POA: Diagnosis not present

## 2015-06-20 DIAGNOSIS — E7800 Pure hypercholesterolemia, unspecified: Secondary | ICD-10-CM | POA: Diagnosis not present

## 2015-06-20 DIAGNOSIS — I480 Paroxysmal atrial fibrillation: Secondary | ICD-10-CM | POA: Diagnosis not present

## 2015-06-20 DIAGNOSIS — I10 Essential (primary) hypertension: Secondary | ICD-10-CM | POA: Diagnosis not present

## 2015-06-20 DIAGNOSIS — E668 Other obesity: Secondary | ICD-10-CM | POA: Diagnosis not present

## 2015-06-20 DIAGNOSIS — R600 Localized edema: Secondary | ICD-10-CM | POA: Diagnosis not present

## 2015-06-20 DIAGNOSIS — Q820 Hereditary lymphedema: Secondary | ICD-10-CM | POA: Diagnosis not present

## 2015-06-20 DIAGNOSIS — E038 Other specified hypothyroidism: Secondary | ICD-10-CM | POA: Diagnosis not present

## 2015-06-20 DIAGNOSIS — I2510 Atherosclerotic heart disease of native coronary artery without angina pectoris: Secondary | ICD-10-CM | POA: Diagnosis not present

## 2015-06-20 DIAGNOSIS — E538 Deficiency of other specified B group vitamins: Secondary | ICD-10-CM | POA: Diagnosis not present

## 2015-07-04 ENCOUNTER — Encounter: Payer: Self-pay | Admitting: Neurology

## 2015-07-04 ENCOUNTER — Ambulatory Visit (INDEPENDENT_AMBULATORY_CARE_PROVIDER_SITE_OTHER): Payer: Medicare Other | Admitting: Neurology

## 2015-07-04 VITALS — BP 100/54 | HR 66 | Ht 64.0 in | Wt 151.0 lb

## 2015-07-04 DIAGNOSIS — G243 Spasmodic torticollis: Secondary | ICD-10-CM

## 2015-07-04 MED ORDER — INCOBOTULINUMTOXINA 100 UNITS IM SOLR
200.0000 [IU] | Freq: Once | INTRAMUSCULAR | Status: AC
  Administered 2015-07-04: 200 [IU] via INTRAMUSCULAR

## 2015-07-04 NOTE — Progress Notes (Signed)
**  Xeomin 100 units x 2 vials, Lot [id number], Exp 10/2016, [place] [id number], office supply.**mck,rn.

## 2015-07-04 NOTE — Progress Notes (Signed)
PATIENT: Victoria Fisher DOB: 05-04-1934  No chief complaint on file.    HISTORICAL  Victoria Fisher, is a 81 years old right-handed female, accompanied by her husband, seen in referral by Dr. Jannifer Franklin for evaluation of possible EMG guided botulism toxin injection for her abnormal neck posturing, her primary care physician is Dr. Domenick Gong.  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  She fell in 2015, in attempt to avoid a glass table while falling, she jerked her neck quickly to the left side, she felt instant pain, heard cracking sound at the posterior lower neck, she fell face down, hyperextended her neck, ever since the event, she had abnormal neck posturing, difficulty to look up straight her neck, difficulty turning, she felt her neck was pulled towards the left side  Over the years, her pain overall has improved, but she has significant abnormal neck posturing, wearing neck brace to holding her neck, she continue has unsteady gait, tendency to fall, her abnormal neck posturing, bilateral lower extremity swelling pain contributes to her fall  I have personally reviewed MRI scan in 2015, MRI of the brain showed mild atrophy no significant abnormality, MRI of cervical spine No abnormality seen to explain falling and abnormal reflexes. Ordinary mid cervical spondylosis without canal stenosis, cord compression or cord lesion. Degenerative spondylosis at the C5-6 level is chronic, and associated with sclerosis and marrow  edema.  Update Jul 04 2015: She is accompanied by her caregiver and her husband at today's clinical visit, this is her first EMG guided xeomin injection for her cervical dystonia, potential side effect was again explained to her  She fell 3 days ago, landed on her right side, with severe right hip pain, REVIEW OF SYSTEMS: Full 14 system review of systems performed and notable only for as above   ALLERGIES: Allergies  Allergen Reactions  . Codeine Anaphylaxis, Hives and Nausea And Vomiting  . Ciprofloxacin Hives and Nausea And Vomiting  . Latex Hives and Rash  . Penicillins Hives    Has patient had a PCN reaction causing immediate rash, facial/tongue/throat swelling, SOB or lightheadedness with hypotension: Yes Has patient had a PCN reaction causing severe rash involving mucus membranes or skin necrosis: Yes Has patient had a PCN reaction that required hospitalization Yes Has patient had a PCN reaction occurring within the last 10 years: Yes If all of the above answers are "NO", then may proceed with Cephalosporin use.  . Sulfonamide Derivatives Hives and Rash    HOME MEDICATIONS: Current Outpatient Prescriptions  Medication Sig Dispense Refill  . aspirin EC 81 MG tablet Take 81 mg by mouth every morning.     [name] atorvastatin (LIPITOR) 40 MG tablet Take 40 mg by mouth daily.    . cholecalciferol (VITAMIN D) 1000 UNITS tablet Take 1,000 Units by mouth daily.    . ferrous sulfate 325 (65 FE) MG tablet TK 1 T PO ONCE D  3  . levalbuterol (XOPENEX HFA) 45 MCG/ACT inhaler Inhale 1 puff into the lungs every 4 (four) hours as needed for wheezing.    [name]  levothyroxine (SYNTHROID, LEVOTHROID) 112 MCG tablet Take 1 tablet (112 mcg total) by mouth every morning. 30 tablet 6  . lisinopril (PRINIVIL,ZESTRIL) 20 MG tablet Take 20 mg by mouth daily.    . magnesium oxide (MAG-OX) 400 (241.3 MG) MG tablet Take 1 tablet (400 mg total) by mouth daily. 30 tablet 5  . metoprolol succinate (TOPROL-XL) 25 MG  24 hr tablet Take 1 tablet (25 mg total) by mouth daily. 30 tablet 5  . metoprolol succinate (TOPROL-XL) 50 MG 24 hr tablet Take 50 mg by mouth daily.     [name] morphine (MS CONTIN) 60 MG 12 hr tablet Take 60 mg by mouth every 12 (twelve) hours.    [name] morphine (MSIR) 30 MG tablet Take 30 mg by mouth 3 (three) times daily as needed (breakthrough pain).     . nitroGLYCERIN (NITROLINGUAL) 0.4 MG/SPRAY spray Place 1 spray under the tongue every 5 (five) minutes as needed for chest pain. 12 g 1  . potassium chloride (K-DUR,KLOR-CON) 10 MEQ tablet Take 10 mEq by mouth daily.     . sennosides-docusate sodium (SENOKOT-S) 8.6-50 MG tablet Take 2-3 tablets by mouth daily as needed for constipation.    . sertraline (ZOLOFT) 100 MG tablet Take 100 mg by mouth daily.    [name] torsemide (DEMADEX) 20 MG tablet Take 20-40 mg by mouth 2 (two) times daily. Take 40 mg every morning and 20 mg every evening    . vitamin E 400 UNIT capsule Take 400 Units by mouth daily.    [name] zolpidem (AMBIEN CR) 6.25 MG CR tablet Take 6.25 mg by mouth at bedtime.     No current facility-administered medications for this visit.    PAST MEDICAL HISTORY: Past Medical History  Diagnosis Date  . Anxiety   . Arthritis   . COPD (chronic obstructive pulmonary disease) ([place])   . Emphysema of lung ([place])   . Tuberculosis   . MI (myocardial infarction) ([place]) 1999    Unstable angina  . Lung nodule   . Diverticulosis of colon   . Depression   . Milroy's disease     Lymphatic disorder [facility] at [facility]  . Chronic anticoagulation     A fib. On Coumadin.  . IBS (irritable bowel syndrome)   . Edema of both legs     Venous Doppler 05/11/12 was normal & no evidence of thrombus or thrombophlebitis. Chronic LE edema related to both cor pulmonale & lymphedema, hyperlipidemia & HTN involving coronary atherosclerosis by cardiac cath in 2004. Cath showed a tiny old occlusion of the optional diagonal branch & also proximal LAD.  [name] Chronic pain syndrome    . Hyperlipidemia     On a statin. Catheterization 02/12/03 Showed a tiny old occlusion of the optional diagonal branch & also proximal LAD.  [name] Hypertension     Myoview 07/2011 EF = >55%. RV was moderately dilated. LA was severely dilated. Catheterization 02/12/03 Showed a tiny old occlusion of the optional diagonal branch & also proximal LAD.  [name] Coronary artery disease   . Coronary atherosclerosis     Mild. Myoview 07/2011 EF = >55%. RV was moderately dilated. LA was severely dilated. Catheterization 02/12/03 Showed a tiny old occlusion of the optional diagonal branch & also proximal LAD.  [name] Chronic cor pulmonale (HCC)     Most likely due to COPD (cannot rule out contribution of diastolic LV dysfunction). ECHO 07/30/11 pulmonary artery pressure was estimated to be 50-60% mmHg & there was moderate tricuspid insufficiency.  [name]  Frequent falls     Possibly due to orthostatic hypotension but we have not seen that on our visits. Recent fall 05/2012 with scalp hematoma, swollen left LE, & black eyes.  . Obstructive sleep apnea     Polysomnogram 09/25/11 AHI: 53.6/hr overall & 57.1/hr during REM sleep. Poor compliance previously with CPAP  . Pulmonary hypertension ([place])     Pullmonary hypertension, chronic cor pulmonale w/pulmonary artery pressures of 50&#x2011; 60 mmHg.  . CHF (congestive heart failure) (HCC)     Acute on chronic, diastolic, improved. Predominantly right heart failure due to cor pulmonale & in turn this is most likely due to untreated OSA.  [name] Dyspnea on exertion     Reluctant to take diuretics  . Atrial fibrillation, chronic (HCC)     On Coumadin & a beta-blocker  . Colon cancer ([place])     Carcinoma of sigmoid colon 1984 & 1985.  [name] Bowel obstruction (HCC)     Caused by scar tissue  . Hypothyroidism 2002    On hormone replacement therapy  . Asthma     On nebulizer  . Insomnia   . 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 Abnormality of gait 01/30/2015    PAST SURGICAL HISTORY: Past  Surgical History  Procedure Laterality Date  . Appendectomy  1956  . Abdominal hysterectomy  1983  . Colon resection      2 times  . Cholecystectomy  1984  . Colon biopsy    . Cardiac catheterization  02/12/03    Showed a tiny old occlusion of the optional diagonal branch & also proximal LAD.  [name] Colonoscopy  07/23/10    FAMILY HISTORY: Family History  Problem Relation Age of Onset  . Heart disease Father   . Alzheimer's disease Mother   . Parkinsonism Brother   . Diabetes Brother     SOCIAL HISTORY:  Social History   Social History  . Marital Status: Married    Spouse Name: Georgiann Mccoy  . Number of Children: 3  . Years of Education: HS   Occupational History  .     Social History Main Topics  . Smoking status: Never Smoker   . Smokeless tobacco: Never Used  . Alcohol Use: No  . Drug Use: No  . Sexual Activity: No   Other Topics Concern  . Not on file   Social History Narrative   Patient is married Georgiann Mccoy) and lives at home with her husband.   Patient is retired.   Patient has three children and her husband has two children.   Patient is right-handed.   Patient drinks very little caffeine (1/2 cup daily)   Patient has a high school education.     PHYSICAL EXAM   There were no vitals filed for this visit.  Not recorded      There is no weight on file to calculate BMI.  PHYSICAL EXAMNIATION:  Gen: NAD, conversant, well nourised, obese, well groomed                     Cardiovascular: Regular rate rhythm, no peripheral edema, warm, nontender. Eyes: Conjunctivae clear without exudates or hemorrhage Neck: Supple, no carotid bruise. Pulmonary: Clear to auscultation bilaterally   NEUROLOGICAL EXAM:  MENTAL STATUS: Speech:    Speech is normal; fluent and spontaneous with normal comprehension.  Cognition:     Orientation to time, place and person     Normal recent and remote memory     Normal Attention span and  concentration     Normal Language,  naming, repeating,spontaneous speech     Fund of knowledge   CRANIAL NERVES: CN II: Visual fields are full to confrontation. Fundoscopic exam is normal with sharp discs and no vascular changes. Pupils are round equal and briskly reactive to light. CN III, IV, VI: extraocular movement are normal. No ptosis. CN V: Facial sensation is intact to pinprick in all 3 divisions bilaterally. Corneal responses are intact.  CN VII: Face is symmetric with normal eye closure and smile. CN VIII: Hearing is normal to rubbing fingers CN IX, X: Palate elevates symmetrically. Phonation is normal. CN XI: She has limited range of motion of her neck, CN XII: Tongue is midline with normal movements and no atrophy.  MOTOR: She has severe anterocollis, her left chin touches her chest, significant tilt to the left side and tilt to the right side, left lateral neck muscle hypertrophy There is no pronator drift of out-stretched arms. Muscle bulk and tone are normal. Muscle strength is normal.  REFLEXES: Reflexes are 1  and symmetric at the biceps, triceps, knees, and ankles. Plantar responses are flexor.  SENSORY: Intact to light touch, pinprick, position sense, and vibration sense are intact in fingers and toes.  COORDINATION: Rapid alternating movements and fine finger movements are intact. There is no dysmetria on finger-to-nose and heel-knee-shin.    GAIT/STANCE: Mildly wide based, unsteady  Musculoskeletal: She had a hard nodule at the right hip, in the size of football,with bruise  DIAGNOSTIC DATA (LABS, IMAGING, TESTING) - I reviewed patient records, labs, notes, testing and imaging myself where available.   ASSESSMENT AND PLAN  SHAKIYA EICHMANN is a 81 y.o. female    Cervical dystonia  Significant anterocollis,  left tilt, right turn EMG guided xeomin injection 100 units of xeomin was dissolved into 2 cc of normal saline, used 200 xeomin total  Left sternocleidomastoid 25 units times 2= 50  units Left iliocostalis 25 units  Left levator scapular 25 units Left posterior scaleus 25 units  Left longissimus capitis 25 units Left splenius capitis 25 units Left upper trapezius 25 units  During injection, she developed angina, require nitroglycerin spray twice,  I also checked her right hip region, she has football size hard soft tissue swelling, with bruise,   Marcial Pacas, M.D. Ph.D.  [facility] [address] [place], [place] [address] Ph: [phone] Fax: [phone]  CC: Dr. Domenick Gong.

## 2015-07-11 DIAGNOSIS — M25551 Pain in right hip: Secondary | ICD-10-CM | POA: Diagnosis not present

## 2015-07-11 DIAGNOSIS — Z6823 Body mass index (BMI) 23.0-23.9, adult: Secondary | ICD-10-CM | POA: Diagnosis not present

## 2015-07-16 ENCOUNTER — Emergency Department (HOSPITAL_COMMUNITY): Payer: Medicare Other

## 2015-07-16 ENCOUNTER — Encounter (HOSPITAL_COMMUNITY): Payer: Self-pay | Admitting: Emergency Medicine

## 2015-07-16 ENCOUNTER — Emergency Department (HOSPITAL_COMMUNITY)
Admission: EM | Admit: 2015-07-16 | Discharge: 2015-07-16 | Disposition: A | Discharge status: Home / Self Care | Payer: Medicare Other | Attending: Emergency Medicine | Admitting: Emergency Medicine

## 2015-07-16 DIAGNOSIS — I482 Chronic atrial fibrillation: Secondary | ICD-10-CM | POA: Insufficient documentation

## 2015-07-16 DIAGNOSIS — I110 Hypertensive heart disease with heart failure: Secondary | ICD-10-CM | POA: Insufficient documentation

## 2015-07-16 DIAGNOSIS — I2510 Atherosclerotic heart disease of native coronary artery without angina pectoris: Secondary | ICD-10-CM | POA: Insufficient documentation

## 2015-07-16 DIAGNOSIS — Z85038 Personal history of other malignant neoplasm of large intestine: Secondary | ICD-10-CM | POA: Insufficient documentation

## 2015-07-16 DIAGNOSIS — S7002XA Contusion of left hip, initial encounter: Secondary | ICD-10-CM | POA: Insufficient documentation

## 2015-07-16 DIAGNOSIS — E039 Hypothyroidism, unspecified: Secondary | ICD-10-CM | POA: Insufficient documentation

## 2015-07-16 DIAGNOSIS — W19XXXA Unspecified fall, initial encounter: Secondary | ICD-10-CM | POA: Diagnosis not present

## 2015-07-16 DIAGNOSIS — J449 Chronic obstructive pulmonary disease, unspecified: Secondary | ICD-10-CM | POA: Diagnosis not present

## 2015-07-16 DIAGNOSIS — S0093XA Contusion of unspecified part of head, initial encounter: Secondary | ICD-10-CM | POA: Diagnosis not present

## 2015-07-16 DIAGNOSIS — Z79899 Other long term (current) drug therapy: Secondary | ICD-10-CM | POA: Insufficient documentation

## 2015-07-16 DIAGNOSIS — Z7982 Long term (current) use of aspirin: Secondary | ICD-10-CM | POA: Insufficient documentation

## 2015-07-16 DIAGNOSIS — I252 Old myocardial infarction: Secondary | ICD-10-CM | POA: Insufficient documentation

## 2015-07-16 DIAGNOSIS — I5033 Acute on chronic diastolic (congestive) heart failure: Secondary | ICD-10-CM | POA: Insufficient documentation

## 2015-07-16 DIAGNOSIS — M25551 Pain in right hip: Secondary | ICD-10-CM | POA: Diagnosis not present

## 2015-07-16 DIAGNOSIS — R296 Repeated falls: Secondary | ICD-10-CM | POA: Diagnosis not present

## 2015-07-16 DIAGNOSIS — S7001XA Contusion of right hip, initial encounter: Secondary | ICD-10-CM | POA: Insufficient documentation

## 2015-07-16 DIAGNOSIS — Y999 Unspecified external cause status: Secondary | ICD-10-CM | POA: Insufficient documentation

## 2015-07-16 DIAGNOSIS — M25552 Pain in left hip: Secondary | ICD-10-CM | POA: Diagnosis not present

## 2015-07-16 DIAGNOSIS — Y929 Unspecified place or not applicable: Secondary | ICD-10-CM | POA: Insufficient documentation

## 2015-07-16 DIAGNOSIS — Y939 Activity, unspecified: Secondary | ICD-10-CM | POA: Insufficient documentation

## 2015-07-16 DIAGNOSIS — M25512 Pain in left shoulder: Secondary | ICD-10-CM | POA: Diagnosis not present

## 2015-07-16 DIAGNOSIS — S4992XA Unspecified injury of left shoulder and upper arm, initial encounter: Secondary | ICD-10-CM | POA: Diagnosis not present

## 2015-07-16 DIAGNOSIS — S40012A Contusion of left shoulder, initial encounter: Secondary | ICD-10-CM | POA: Insufficient documentation

## 2015-07-16 DIAGNOSIS — S0083XA Contusion of other part of head, initial encounter: Secondary | ICD-10-CM | POA: Diagnosis not present

## 2015-07-16 DIAGNOSIS — S0990XA Unspecified injury of head, initial encounter: Secondary | ICD-10-CM | POA: Diagnosis present

## 2015-07-16 NOTE — ED Notes (Signed)
EDP at bedside  

## 2015-07-16 NOTE — ED Notes (Signed)
Pt sts fall x 4 today and sts last fall she hit her head; pt with hx of falls; pt wears neck brace from fall 2 years ago; pt sts double vision since fall

## 2015-07-16 NOTE — ED Provider Notes (Signed)
CSN: [id number]     Arrival date & time 07/16/15  1547 History   First MD Initiated Contact with Patient 07/16/15 2044     Chief Complaint  Patient presents with  . Fall  . Head Injury     (Consider location/radiation/quality/duration/timing/severity/associated sxs/prior Treatment) Patient is a 81 y.o. female presenting with fall and head injury. The history is provided by the patient (Patient states that she has a neurological condition that makes her follow-up and she fell and she is hurt both hips left shoulder and her head no LOC).  Fall This is a recurrent problem. The current episode started 6 to 12 hours ago. The problem occurs every several days. The problem has been resolved. Pertinent negatives include no chest pain, no abdominal pain and no headaches. Nothing aggravates the symptoms. Nothing relieves the symptoms.  Head Injury Associated symptoms: no headaches and no seizures     Past Medical History  Diagnosis Date  . Anxiety   . Arthritis   . COPD (chronic obstructive pulmonary disease) ([place])   . Emphysema of lung ([place])   . Tuberculosis   . MI (myocardial infarction) ([place]) 1999    Unstable angina  . Lung nodule   . Diverticulosis of colon   . Depression   . Milroy's disease     Lymphatic disorder diagnosed at [facility]  . Chronic anticoagulation     A fib. On Coumadin.  . IBS (irritable bowel syndrome)   . Edema of both legs     Venous Doppler 05/11/12 was normal & no evidence of thrombus or thrombophlebitis. Chronic LE edema related to both cor pulmonale & lymphedema, hyperlipidemia & HTN involving coronary atherosclerosis by cardiac cath in 2004. Cath showed a tiny old occlusion of the optional diagonal branch & also proximal LAD.  [name] Chronic pain syndrome   . Hyperlipidemia     On a statin. Catheterization 02/12/03 Showed a tiny old occlusion of the optional diagonal branch & also proximal LAD.  [name] Hypertension     Myoview 07/2011 EF = >55%. RV was moderately dilated.  LA was severely dilated. Catheterization 02/12/03 Showed a tiny old occlusion of the optional diagonal branch & also proximal LAD.  [name] Coronary artery disease   . Coronary atherosclerosis     Mild. Myoview 07/2011 EF = >55%. RV was moderately dilated. LA was severely dilated. Catheterization 02/12/03 Showed a tiny old occlusion of the optional diagonal branch & also proximal LAD.  [name] Chronic cor pulmonale (HCC)     Most likely due to COPD (cannot rule out contribution of diastolic LV dysfunction). ECHO 07/30/11 pulmonary artery pressure was estimated to be 50-60% mmHg & there was moderate tricuspid insufficiency.  . Frequent falls     Possibly due to orthostatic hypotension but we have not seen that on our visits. Recent fall 05/2012 with scalp hematoma, swollen left LE, & black eyes.  . Obstructive sleep apnea     Polysomnogram 09/25/11 AHI: 53.6/hr overall & 57.1/hr during REM sleep. Poor compliance previously with CPAP  . Pulmonary hypertension ([place])     Pullmonary hypertension, chronic cor pulmonale w/pulmonary artery pressures of 50&#x2011; 60 mmHg.  . CHF (congestive heart failure) (HCC)     Acute on chronic, diastolic, improved. Predominantly right heart failure due to cor pulmonale & in turn this is most likely due to untreated OSA.  [name] Dyspnea on exertion     Reluctant to take diuretics  . Atrial fibrillation, chronic (HCC)     On Coumadin &  a beta-blocker  . Colon cancer ([place])     Carcinoma of sigmoid colon 1984 & 1985.  [name] Bowel obstruction (HCC)     Caused by scar tissue  . Hypothyroidism 2002    On hormone replacement therapy  . Asthma     On nebulizer  . Insomnia   . 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 Abnormality of gait 01/30/2015   Past Surgical History  Procedure Laterality Date  . Appendectomy  1956  . Abdominal hysterectomy  1983  . Colon resection      2 times  . Cholecystectomy  1984  . Colon biopsy    . Cardiac catheterization  02/12/03    Showed a tiny old  occlusion of the optional diagonal branch & also proximal LAD.  [name] Colonoscopy  07/23/10   Family History  Problem Relation Age of Onset  . Heart disease Father   . Alzheimer's disease Mother   . Parkinsonism Brother   . Diabetes Brother    Social History  Substance Use Topics  . Smoking status: Never Smoker   . Smokeless tobacco: Never Used  . Alcohol Use: No   OB History    No data available     Review of Systems  Constitutional: Negative for appetite change and fatigue.  HENT: Negative for congestion, ear discharge and sinus pressure.        Headache  Eyes: Negative for discharge.  Respiratory: Negative for cough.   Cardiovascular: Negative for chest pain.  Gastrointestinal: Negative for abdominal pain and diarrhea.  Genitourinary: Negative for frequency and hematuria.  Musculoskeletal: Negative for back pain.       Bilateral hip pain and left shoulder pain  Skin: Negative for rash.  Neurological: Negative for seizures and headaches.  Psychiatric/Behavioral: Negative for hallucinations.      Allergies  Codeine; Ciprofloxacin; Latex; Penicillins; and Sulfonamide derivatives  Home Medications   Prior to Admission medications   Medication Sig Start Date End Date Taking? Authorizing Provider  aspirin EC 81 MG tablet Take 81 mg by mouth every morning.    Yes Historical Provider, MD  atorvastatin (LIPITOR) 40 MG tablet Take 40 mg by mouth daily.   Yes Historical Provider, MD  cholecalciferol (VITAMIN D) 1000 UNITS tablet Take 1,000 Units by mouth daily.   Yes Historical Provider, MD  ferrous sulfate 325 (65 FE) MG tablet TK 1 T PO ONCE D 02/15/15  Yes Historical Provider, MD  levalbuterol (XOPENEX HFA) 45 MCG/ACT inhaler Inhale 1 puff into the lungs every 4 (four) hours as needed for wheezing.   Yes Historical Provider, MD  levothyroxine (SYNTHROID, LEVOTHROID) 112 MCG tablet Take 1 tablet (112 mcg total) by mouth every morning. 06/30/13  Yes Mihai Croitoru, MD  lisinopril  (PRINIVIL,ZESTRIL) 20 MG tablet Take 20 mg by mouth daily.   Yes Historical Provider, MD  magnesium oxide (MAG-OX) 400 (241.3 MG) MG tablet Take 1 tablet (400 mg total) by mouth daily. 07/11/13  Yes Haywood Pao, MD  metoprolol succinate (TOPROL-XL) 25 MG 24 hr tablet Take 1 tablet (25 mg total) by mouth daily. 07/11/13  Yes Haywood Pao, MD  metoprolol succinate (TOPROL-XL) 50 MG 24 hr tablet Take 50 mg by mouth daily.  08/24/14  Yes Historical Provider, MD  morphine (MS CONTIN) 60 MG 12 hr tablet Take 60 mg by mouth every 12 (twelve) hours.   Yes Historical Provider, MD  morphine (MSIR) 30 MG tablet Take 30 mg by mouth 3 (three) times daily as needed (  breakthrough pain).    Yes Historical Provider, MD  nitroGLYCERIN (NITROLINGUAL) 0.4 MG/SPRAY spray Place 1 spray under the tongue every 5 (five) minutes as needed for chest pain. 12/13/12  Yes Mihai Croitoru, MD  potassium chloride (K-DUR,KLOR-CON) 10 MEQ tablet Take 10 mEq by mouth daily.    Yes Historical Provider, MD  sennosides-docusate sodium (SENOKOT-S) 8.6-50 MG tablet Take 2-3 tablets by mouth daily as needed for constipation.   Yes Historical Provider, MD  sertraline (ZOLOFT) 100 MG tablet Take 100 mg by mouth daily.   Yes Historical Provider, MD  torsemide (DEMADEX) 20 MG tablet Take 20-40 mg by mouth 2 (two) times daily. Take 40 mg every morning and 20 mg every evening   Yes Historical Provider, MD  vitamin E 400 UNIT capsule Take 400 Units by mouth daily.   Yes Historical Provider, MD  zolpidem (AMBIEN CR) 6.25 MG CR tablet Take 6.25 mg by mouth at bedtime. Take 0.5-1.0 tablets as needed for sleep.   Yes Historical Provider, MD   BP 104/69 mmHg  Pulse 85  Temp(Src) 98 F (36.7 C) (Oral)  Resp 16  SpO2 98% Physical Exam  Constitutional: She is oriented to person, place, and time. She appears well-developed.  HENT:  Head: Normocephalic.  Swelling tenderness to left occipital head  Eyes: Conjunctivae and EOM are normal. No  scleral icterus.  Neck: Neck supple. No thyromegaly present.  Cardiovascular: Normal rate and regular rhythm.  Exam reveals no gallop and no friction rub.   No murmur heard. Pulmonary/Chest: No stridor. She has no wheezes. She has no rales. She exhibits no tenderness.  Abdominal: She exhibits no distension. There is no tenderness. There is no rebound.  Musculoskeletal: Normal range of motion. She exhibits no edema.  Tender left shoulder.   Post hips tender right hip has bruising  Lymphadenopathy:    She has no cervical adenopathy.  Neurological: She is oriented to person, place, and time. She exhibits normal muscle tone. Coordination normal.  Skin: No rash noted. No erythema.  Psychiatric: She has a normal mood and affect. Her behavior is normal.    ED Course  Procedures (including critical care time) Labs Review Labs Reviewed - No data to display  Imaging Review Ct Head Wo Contrast  07/16/2015  CLINICAL DATA:  Multiple falls, neck brace from injury 2 years ago EXAM: CT HEAD WITHOUT CONTRAST CT CERVICAL SPINE WITHOUT CONTRAST TECHNIQUE: Multidetector CT imaging of the head and cervical spine was performed following the standard protocol without intravenous contrast. Multiplanar CT image reconstructions of the cervical spine were also generated. COMPARISON:  11/18/2014 FINDINGS: CT HEAD FINDINGS No evidence of parenchymal hemorrhage or extra-axial fluid collection. No mass lesion, mass effect, or midline shift. No CT evidence of acute infarction. Mild subcortical white matter and periventricular small vessel ischemic changes. Global cortical atrophy.  No ventriculomegaly. The visualized paranasal sinuses are essentially clear. The mastoid air cells are unopacified. No evidence of calvarial fracture. CT CERVICAL SPINE FINDINGS Reversal of the normal mid cervical lordosis. 5 mm anterolisthesis of C3 on C4, unchanged. No evidence of fracture or dislocation. Vertebral body heights are maintained.  Dens appears intact. No prevertebral soft tissue swelling. Mild to moderate multilevel degenerative changes, most prominent at C5-6. Visualized thyroid is unremarkable. Mildly prominent cervical lymph nodes, including an 11 mm short axis left supraclavicular node (series 302/ image 58), previously 10 mm in 2016, chronic and likely reactive. Visualized lung apices are notable for mild biapical pleural-parenchymal scarring. IMPRESSION: No evidence of  acute intracranial abnormality. Atrophy with mild small vessel ischemic changes. No evidence of traumatic injury to the cervical spine. Mild to moderate degenerative changes. 5 mm anterolisthesis of C3 on C4, chronic. Electronically Signed   By: Julian Hy M.D.   On: 07/16/2015 18:18   Ct Cervical Spine Wo Contrast  07/16/2015  CLINICAL DATA:  Multiple falls, neck brace from injury 2 years ago EXAM: CT HEAD WITHOUT CONTRAST CT CERVICAL SPINE WITHOUT CONTRAST TECHNIQUE: Multidetector CT imaging of the head and cervical spine was performed following the standard protocol without intravenous contrast. Multiplanar CT image reconstructions of the cervical spine were also generated. COMPARISON:  11/18/2014 FINDINGS: CT HEAD FINDINGS No evidence of parenchymal hemorrhage or extra-axial fluid collection. No mass lesion, mass effect, or midline shift. No CT evidence of acute infarction. Mild subcortical white matter and periventricular small vessel ischemic changes. Global cortical atrophy.  No ventriculomegaly. The visualized paranasal sinuses are essentially clear. The mastoid air cells are unopacified. No evidence of calvarial fracture. CT CERVICAL SPINE FINDINGS Reversal of the normal mid cervical lordosis. 5 mm anterolisthesis of C3 on C4, unchanged. No evidence of fracture or dislocation. Vertebral body heights are maintained. Dens appears intact. No prevertebral soft tissue swelling. Mild to moderate multilevel degenerative changes, most prominent at C5-6.  Visualized thyroid is unremarkable. Mildly prominent cervical lymph nodes, including an 11 mm short axis left supraclavicular node (series 302/ image 58), previously 10 mm in 2016, chronic and likely reactive. Visualized lung apices are notable for mild biapical pleural-parenchymal scarring. IMPRESSION: No evidence of acute intracranial abnormality. Atrophy with mild small vessel ischemic changes. No evidence of traumatic injury to the cervical spine. Mild to moderate degenerative changes. 5 mm anterolisthesis of C3 on C4, chronic. Electronically Signed   By: Julian Hy M.D.   On: 07/16/2015 18:18   Dg Shoulder Left  07/16/2015  CLINICAL DATA:  Fall today with left shoulder pain. EXAM: LEFT SHOULDER - 2+ VIEW COMPARISON:  None. FINDINGS: Proliferative change at the acromioclavicular joint. Glenohumeral alignment is maintained. No acute fracture or dislocation. Subcortical change in the rotator cuff insertion suggesting underlying rotator cuff arthropathy. No soft tissue calcifications. IMPRESSION: Degenerative and chronic change about the shoulder. No acute fracture or subluxation. Electronically Signed   By: Jeb Levering M.D.   On: 07/16/2015 22:11   Dg Hips Bilat With Pelvis 3-4 Views  07/16/2015  CLINICAL DATA:  Fall today with bilateral hip pain. EXAM: DG HIP (WITH OR WITHOUT PELVIS) 3-4V BILAT COMPARISON:  Pelvis radiograph 11/18/2014 FINDINGS: The cortical margins of the bony pelvis are intact. No fracture. Pubic symphysis and sacroiliac joints are congruent. Both femoral heads are well-seated in the respective acetabula. Curvilinear density adjacent to the left acetabulum is chronic. Enteric sutures in the pelvis. IMPRESSION: No fracture or subluxation of the pelvis or either hip. Electronically Signed   By: Jeb Levering M.D.   On: 07/16/2015 22:10   I have personally reviewed and evaluated these images and lab results as part of my medical decision-making.   EKG  Interpretation None      MDM   Final diagnoses:  Fall, initial encounter    Fall with contusion to left shoulder left and right hips and head. Patient will follow-up with PCP    Milton Ferguson, MD 07/16/15 2224

## 2015-07-16 NOTE — Discharge Instructions (Signed)
Follow up with your md for recheck °

## 2015-07-22 ENCOUNTER — Emergency Department (HOSPITAL_COMMUNITY)
Admission: EM | Admit: 2015-07-22 | Discharge: 2015-07-22 | Disposition: A | Discharge status: Home / Self Care | Payer: Medicare Other | Attending: Emergency Medicine | Admitting: Emergency Medicine

## 2015-07-22 ENCOUNTER — Emergency Department (HOSPITAL_COMMUNITY): Payer: Medicare Other

## 2015-07-22 ENCOUNTER — Encounter (HOSPITAL_COMMUNITY): Payer: Self-pay | Admitting: Emergency Medicine

## 2015-07-22 DIAGNOSIS — Z23 Encounter for immunization: Secondary | ICD-10-CM | POA: Diagnosis not present

## 2015-07-22 DIAGNOSIS — I110 Hypertensive heart disease with heart failure: Secondary | ICD-10-CM | POA: Insufficient documentation

## 2015-07-22 DIAGNOSIS — I5033 Acute on chronic diastolic (congestive) heart failure: Secondary | ICD-10-CM | POA: Insufficient documentation

## 2015-07-22 DIAGNOSIS — S81811A Laceration without foreign body, right lower leg, initial encounter: Secondary | ICD-10-CM | POA: Diagnosis not present

## 2015-07-22 DIAGNOSIS — I252 Old myocardial infarction: Secondary | ICD-10-CM | POA: Diagnosis not present

## 2015-07-22 DIAGNOSIS — I2510 Atherosclerotic heart disease of native coronary artery without angina pectoris: Secondary | ICD-10-CM | POA: Insufficient documentation

## 2015-07-22 DIAGNOSIS — T148 Other injury of unspecified body region: Secondary | ICD-10-CM | POA: Diagnosis not present

## 2015-07-22 DIAGNOSIS — E785 Hyperlipidemia, unspecified: Secondary | ICD-10-CM | POA: Insufficient documentation

## 2015-07-22 DIAGNOSIS — G4733 Obstructive sleep apnea (adult) (pediatric): Secondary | ICD-10-CM | POA: Diagnosis not present

## 2015-07-22 DIAGNOSIS — Z7982 Long term (current) use of aspirin: Secondary | ICD-10-CM | POA: Insufficient documentation

## 2015-07-22 DIAGNOSIS — Y939 Activity, unspecified: Secondary | ICD-10-CM | POA: Diagnosis not present

## 2015-07-22 DIAGNOSIS — E039 Hypothyroidism, unspecified: Secondary | ICD-10-CM | POA: Insufficient documentation

## 2015-07-22 DIAGNOSIS — M1990 Unspecified osteoarthritis, unspecified site: Secondary | ICD-10-CM | POA: Insufficient documentation

## 2015-07-22 DIAGNOSIS — S81819A Laceration without foreign body, unspecified lower leg, initial encounter: Secondary | ICD-10-CM | POA: Diagnosis not present

## 2015-07-22 DIAGNOSIS — Z85038 Personal history of other malignant neoplasm of large intestine: Secondary | ICD-10-CM | POA: Insufficient documentation

## 2015-07-22 DIAGNOSIS — W228XXA Striking against or struck by other objects, initial encounter: Secondary | ICD-10-CM | POA: Insufficient documentation

## 2015-07-22 DIAGNOSIS — S8991XA Unspecified injury of right lower leg, initial encounter: Secondary | ICD-10-CM | POA: Diagnosis not present

## 2015-07-22 DIAGNOSIS — Y92238 Other place in hospital as the place of occurrence of the external cause: Secondary | ICD-10-CM | POA: Diagnosis not present

## 2015-07-22 DIAGNOSIS — Y999 Unspecified external cause status: Secondary | ICD-10-CM | POA: Insufficient documentation

## 2015-07-22 DIAGNOSIS — Z91040 Latex allergy status: Secondary | ICD-10-CM | POA: Diagnosis not present

## 2015-07-22 DIAGNOSIS — J449 Chronic obstructive pulmonary disease, unspecified: Secondary | ICD-10-CM | POA: Diagnosis not present

## 2015-07-22 DIAGNOSIS — F329 Major depressive disorder, single episode, unspecified: Secondary | ICD-10-CM | POA: Insufficient documentation

## 2015-07-22 DIAGNOSIS — M79661 Pain in right lower leg: Secondary | ICD-10-CM | POA: Diagnosis not present

## 2015-07-22 DIAGNOSIS — M79606 Pain in leg, unspecified: Secondary | ICD-10-CM | POA: Diagnosis not present

## 2015-07-22 MED ORDER — LIDOCAINE HCL (PF) 1 % IJ SOLN: 5.0000 mL | Freq: Once | INTRAMUSCULAR | Status: DC

## 2015-07-22 MED ORDER — LIDOCAINE-EPINEPHRINE (PF) 1 %-1:200000 IJ SOLN
INTRAMUSCULAR | Status: AC
  Filled 2015-07-22: qty 30

## 2015-07-22 MED ORDER — TETANUS-DIPHTH-ACELL PERTUSSIS 5-2.5-18.5 LF-MCG/0.5 IM SUSP
0.5000 mL | Freq: Once | INTRAMUSCULAR | Status: AC
  Administered 2015-07-22: 0.5 mL via INTRAMUSCULAR
  Filled 2015-07-22: qty 0.5

## 2015-07-22 NOTE — ED Notes (Signed)
Pt returned from xray

## 2015-07-22 NOTE — ED Notes (Signed)
R leg cleaned and dressed. Telfa with Kling secured around leg.

## 2015-07-22 NOTE — ED Notes (Signed)
[facility] arrived for transport 

## 2015-07-22 NOTE — ED Notes (Signed)
Pt transported from [facility], pt states R leg fell off bed and struck metal support. Laceration to lower leg, dressed by EMS, bleeding controlled.

## 2015-07-22 NOTE — ED Notes (Signed)
Pt's legs cleaned, dried blood covering bilat lower extremities. Wound unwrapped with MD at bedside, jagged skin tear noted. Bleeding noted unless direct pressure is being held. Pressure dressing applied.  Pt reports she falls "a lot", bruising noted to arms, shoulders and hips from fall last week. Pt cares for husband with dementia, who is with neighbors at this time.

## 2015-07-22 NOTE — Discharge Instructions (Signed)
Stitches need to be removed in 10 days.  Laceration Care, Adult A laceration is a cut that goes through all layers of the skin. The cut also goes into the tissue that is right under the skin. Some cuts heal on their own. Others need to be closed with stitches (sutures), staples, skin adhesive strips, or wound glue. Taking care of your cut lowers your risk of infection and helps your cut to heal better. HOW TO TAKE CARE OF YOUR CUT For stitches or staples:  Keep the wound clean and dry.  If you were given a bandage (dressing), you should change it at least one time per day or as told by your doctor. You should also change it if it gets wet or dirty.  Keep the wound completely dry for the first 24 hours or as told by your doctor. After that time, you may take a shower or a bath. However, make sure that the wound is not soaked in water until after the stitches or staples have been removed.  Clean the wound one time each day or as told by your doctor:  Wash the wound with soap and water.  Rinse the wound with water until all of the soap comes off.  Pat the wound dry with a clean towel. Do not rub the wound.  After you clean the wound, put a thin layer of antibiotic ointment on it as told by your doctor. This ointment:  Helps to prevent infection.  Keeps the bandage from sticking to the wound.  Have your stitches or staples removed as told by your doctor. If your doctor used skin adhesive strips:   Keep the wound clean and dry.  If you were given a bandage, you should change it at least one time per day or as told by your doctor. You should also change it if it gets dirty or wet.  Do not get the skin adhesive strips wet. You can take a shower or a bath, but be careful to keep the wound dry.  If the wound gets wet, pat it dry with a clean towel. Do not rub the wound.  Skin adhesive strips fall off on their own. You can trim the strips as the wound heals. Do not remove any strips that  are still stuck to the wound. They will fall off after a while. If your doctor used wound glue:  Try to keep your wound dry, but you may briefly wet it in the shower or bath. Do not soak the wound in water, such as by swimming.  After you take a shower or a bath, gently pat the wound dry with a clean towel. Do not rub the wound.  Do not do any activities that will make you really sweaty until the skin glue has fallen off on its own.  Do not apply liquid, cream, or ointment medicine to your wound while the skin glue is still on.  If you were given a bandage, you should change it at least one time per day or as told by your doctor. You should also change it if it gets dirty or wet.  If a bandage is placed over the wound, do not let the tape for the bandage touch the skin glue.  Do not pick at the glue. The skin glue usually stays on for 5-10 days. Then, it falls off of the skin. General Instructions  To help prevent scarring, make sure to cover your wound with sunscreen whenever you are outside  after stitches are removed, after adhesive strips are removed, or when wound glue stays in place and the wound is healed. Make sure to wear a sunscreen of at least 30 SPF.  Take over-the-counter and prescription medicines only as told by your doctor.  If you were given antibiotic medicine or ointment, take or apply it as told by your doctor. Do not stop using the antibiotic even if your wound is getting better.  Do not scratch or pick at the wound.  Keep all follow-up visits as told by your doctor. This is important.  Check your wound every day for signs of infection. Watch for:  Redness, swelling, or pain.  Fluid, blood, or pus.  Raise (elevate) the injured area above the level of your heart while you are sitting or lying down, if possible. GET HELP IF:  You got a tetanus shot and you have any of these problems at the injection site:  Swelling.  Very bad  pain.  Redness.  Bleeding.  You have a fever.  A wound that was closed breaks open.  You notice a bad smell coming from your wound or your bandage.  You notice something coming out of the wound, such as wood or glass.  Medicine does not help your pain.  You have more redness, swelling, or pain at the site of your wound.  You have fluid, blood, or pus coming from your wound.  You notice a change in the color of your skin near your wound.  You need to change the bandage often because fluid, blood, or pus is coming from the wound.  You start to have a new rash.  You start to have numbness around the wound. GET HELP RIGHT AWAY IF:  You have very bad swelling around the wound.  Your pain suddenly gets worse and is very bad.  You notice painful lumps near the wound or on skin that is anywhere on your body.  You have a red streak going away from your wound.  The wound is on your hand or foot and you cannot move a finger or toe like you usually can.  The wound is on your hand or foot and you notice that your fingers or toes look pale or bluish.   This information is not intended to replace advice given to you by your health care provider. Make sure you discuss any questions you have with your health care provider.   Document Released: 07/22/2007 Document Revised: 06/19/2014 Document Reviewed: 01/29/2014 Elsevier Interactive Patient Education [employer].

## 2015-07-22 NOTE — ED Notes (Signed)
Transport called for pt discharge

## 2015-07-22 NOTE — ED Notes (Signed)
Pt to xray via stretcher

## 2015-07-22 NOTE — ED Provider Notes (Signed)
CSN: [id number]     Arrival date & time 07/22/15  0051 History  By signing my name below, I, [facility], attest that this documentation has been prepared under the direction and in the presence of Merryl Hacker, MD. Electronically Signed: Virgel Bouquet, ED Scribe. 07/22/2015. 2:28 AM.   Chief Complaint  Patient presents with  . Fall    The history is provided by the patient. No language interpreter was used.   HPI Comments: Victoria Fisher is a 81 y.o. female with an hx of COPD, MI, Milroy's disease, chronic anticoagulation, chronic BLE edema, frequent falls, HLN, HTN, CAD, CHF, A-Fibwho presents to the Emergency Department complaining of small, actively bleeding laceration to her lower right leg after a fall that occurred shortly PTA. Pt states that she lives in [name], an independent living facility, with her husband and was attempting to sit up in a hospital bed when she fell out of the bed, striking her lower right leg on a metal piece of the bed, lacerating it and followed immediately by profuse bleeding. Per medical records, pt fell one week ago and was evaluated in the [facility] ED by Dr. Milton Ferguson where she was diagnosed with contusions to her left shoulder, bilateral hips, and head and was advised to follow-up with her PCP. Denies bilateral hip pain different from baseline, myalgias different from baseline, leg swelling different from baseline, or any other complaints currently.  Denies hitting her head or LOC.  Past Medical History  Diagnosis Date  . Anxiety   . Arthritis   . COPD (chronic obstructive pulmonary disease) ([place])   . Emphysema of lung ([place])   . Tuberculosis   . MI (myocardial infarction) ([place]) 1999    Unstable angina  . Lung nodule   . Diverticulosis of colon   . Depression   . Milroy's disease     Lymphatic disorder diagnosed at [facility]  . Chronic anticoagulation     A fib. On Coumadin.  . IBS (irritable bowel syndrome)   . Edema of  both legs     Venous Doppler 05/11/12 was normal & no evidence of thrombus or thrombophlebitis. Chronic LE edema related to both cor pulmonale & lymphedema, hyperlipidemia & HTN involving coronary atherosclerosis by cardiac cath in 2004. Cath showed a tiny old occlusion of the optional diagonal branch & also proximal LAD.  [name] Chronic pain syndrome   . Hyperlipidemia     On a statin. Catheterization 02/12/03 Showed a tiny old occlusion of the optional diagonal branch & also proximal LAD.  [name] Hypertension     Myoview 07/2011 EF = >55%. RV was moderately dilated. LA was severely dilated. Catheterization 02/12/03 Showed a tiny old occlusion of the optional diagonal branch & also proximal LAD.  [name] Coronary artery disease   . Coronary atherosclerosis     Mild. Myoview 07/2011 EF = >55%. RV was moderately dilated. LA was severely dilated. Catheterization 02/12/03 Showed a tiny old occlusion of the optional diagonal branch & also proximal LAD.  [name] Chronic cor pulmonale (HCC)     Most likely due to COPD (cannot rule out contribution of diastolic LV dysfunction). ECHO 07/30/11 pulmonary artery pressure was estimated to be 50-60% mmHg & there was moderate tricuspid insufficiency.  . Frequent falls     Possibly due to orthostatic hypotension but we have not seen that on our visits. Recent fall 05/2012 with scalp hematoma, swollen left LE, & black eyes.  . Obstructive sleep apnea  Polysomnogram 09/25/11 AHI: 53.6/hr overall & 57.1/hr during REM sleep. Poor compliance previously with CPAP  . Pulmonary hypertension ([place])     Pullmonary hypertension, chronic cor pulmonale w/pulmonary artery pressures of 50&#x2011; 60 mmHg.  . CHF (congestive heart failure) (HCC)     Acute on chronic, diastolic, improved. Predominantly right heart failure due to cor pulmonale & in turn this is most likely due to untreated OSA.  [name] Dyspnea on exertion     Reluctant to take diuretics  . Atrial fibrillation, chronic (HCC)     On  Coumadin & a beta-blocker  . Colon cancer ([place])     Carcinoma of sigmoid colon 1984 & 1985.  [name] Bowel obstruction (HCC)     Caused by scar tissue  . Hypothyroidism 2002    On hormone replacement therapy  . Asthma     On nebulizer  . Insomnia   . 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 Abnormality of gait 01/30/2015   Past Surgical History  Procedure Laterality Date  . Appendectomy  1956  . Abdominal hysterectomy  1983  . Colon resection      2 times  . Cholecystectomy  1984  . Colon biopsy    . Cardiac catheterization  02/12/03    Showed a tiny old occlusion of the optional diagonal branch & also proximal LAD.  [name] Colonoscopy  07/23/10   Family History  Problem Relation Age of Onset  . Heart disease Father   . Alzheimer's disease Mother   . Parkinsonism Brother   . Diabetes Brother    Social History  Substance Use Topics  . Smoking status: Never Smoker   . Smokeless tobacco: Never Used  . Alcohol Use: No   OB History    No data available     Review of Systems  Constitutional: Negative for fever.  Cardiovascular: Positive for leg swelling (baseline).  Musculoskeletal: Positive for arthralgias (bilateral hips, baseline). Negative for myalgias.  Skin: Positive for color change (baseline) and wound.  All other systems reviewed and are negative.     Allergies  Codeine; Ciprofloxacin; Latex; Penicillins; and Sulfonamide derivatives  Home Medications   Prior to Admission medications   Medication Sig Start Date End Date Taking? Authorizing Provider  aspirin EC 81 MG tablet Take 81 mg by mouth every morning.    Yes Historical Provider, MD  atorvastatin (LIPITOR) 40 MG tablet Take 40 mg by mouth daily.   Yes Historical Provider, MD  cholecalciferol (VITAMIN D) 1000 UNITS tablet Take 1,000 Units by mouth daily.   Yes Historical Provider, MD  ferrous sulfate 325 (65 FE) MG tablet TK 1 T PO ONCE D 02/15/15  Yes Historical Provider, MD  levalbuterol (XOPENEX HFA) 45 MCG/ACT  inhaler Inhale 1 puff into the lungs every 4 (four) hours as needed for wheezing.   Yes Historical Provider, MD  levothyroxine (SYNTHROID, LEVOTHROID) 112 MCG tablet Take 1 tablet (112 mcg total) by mouth every morning. 06/30/13  Yes Mihai Croitoru, MD  lisinopril (PRINIVIL,ZESTRIL) 20 MG tablet Take 20 mg by mouth daily.   Yes Historical Provider, MD  magnesium oxide (MAG-OX) 400 (241.3 MG) MG tablet Take 1 tablet (400 mg total) by mouth daily. 07/11/13  Yes Haywood Pao, MD  metoprolol succinate (TOPROL-XL) 25 MG 24 hr tablet Take 1 tablet (25 mg total) by mouth daily. 07/11/13  Yes Haywood Pao, MD  morphine (MS CONTIN) 60 MG 12 hr tablet Take 60 mg by mouth every 12 (twelve) hours.   Yes  Historical Provider, MD  morphine (MSIR) 30 MG tablet Take 30 mg by mouth 3 (three) times daily as needed (breakthrough pain).    Yes Historical Provider, MD  nitroGLYCERIN (NITROLINGUAL) 0.4 MG/SPRAY spray Place 1 spray under the tongue every 5 (five) minutes as needed for chest pain. 12/13/12  Yes Mihai Croitoru, MD  potassium chloride (K-DUR,KLOR-CON) 10 MEQ tablet Take 10 mEq by mouth daily.    Yes Historical Provider, MD  sennosides-docusate sodium (SENOKOT-S) 8.6-50 MG tablet Take 2-3 tablets by mouth daily as needed for constipation.   Yes Historical Provider, MD  sertraline (ZOLOFT) 100 MG tablet Take 100 mg by mouth daily.   Yes Historical Provider, MD  torsemide (DEMADEX) 20 MG tablet Take 20-40 mg by mouth 2 (two) times daily. Take 40 mg every morning and 20 mg every evening   Yes Historical Provider, MD  vitamin E 400 UNIT capsule Take 400 Units by mouth daily.   Yes Historical Provider, MD  zolpidem (AMBIEN CR) 6.25 MG CR tablet Take 6.25 mg by mouth at bedtime. Take 0.5-1.0 tablets as needed for sleep.   Yes Historical Provider, MD   BP 114/60 mmHg  Pulse 75  Temp(Src) 98 F (36.7 C) (Oral)  Resp 20  SpO2 100% Physical Exam  Constitutional: She is oriented to person, place, and time. No  distress.  Elderly, nontoxic, no acute distress  HENT:  Head: Normocephalic and atraumatic.  Cardiovascular: Normal rate, regular rhythm and normal heart sounds.   No murmur heard. Pulmonary/Chest: Effort normal and breath sounds normal. No respiratory distress. She has no wheezes.  Musculoskeletal: She exhibits edema.  2+ bilateral lower extremity pitting edema  Neurological: She is alert and oriented to person, place, and time.  Skin: Skin is warm and dry.  4.5 centimeter L-shaped laceration to the right lower extremity with mild oozing  Psychiatric: She has a normal mood and affect.  Nursing note and vitals reviewed.   ED Course  Procedures (including critical care time)  LACERATION REPAIR Performed by: Merryl Hacker Authorized by: Merryl Hacker Consent: Verbal consent obtained. Risks and benefits: risks, benefits and alternatives were discussed Consent given by: patient Patient identity confirmed: provided demographic data Prepped and Draped in normal sterile fashion Wound explored  Laceration Location: right leg  Laceration Length: 4.5cm  No Foreign Bodies seen or palpated  Anesthesia: local infiltration  Local anesthetic: lidocaine 1% w epinephrine  Anesthetic total: 5 ml  Irrigation method: syringe Amount of cleaning: standard  Skin closure: 3-0 nylon  Number of sutures: 3  Technique: 2 interrupted, one mattress  Patient tolerance: Patient tolerated the procedure well with no immediate complications.   DIAGNOSTIC STUDIES: Oxygen Saturation is 99% on RA, normal by my interpretation.    COORDINATION OF CARE: 1:43 AM Will update tetanus. Will order right lower leg x-ray. Discussed treatment plan with pt at bedside and pt agreed to plan.   Labs Review Labs Reviewed - No data to display  Imaging Review Dg Tibia/fibula Right  07/22/2015  CLINICAL DATA:  81 year old female with fall and right lower extremity pain. EXAM: RIGHT TIBIA AND FIBULA  - 2 VIEW COMPARISON:  None. FINDINGS: There is no acute fracture or dislocation. Mild subcutaneous edema. No radiopaque foreign object. IMPRESSION: Negative. Electronically Signed   By: Anner Crete M.D.   On: 07/22/2015 02:56   I have personally reviewed and evaluated these images and lab results as part of my medical decision-making.   EKG Interpretation None  MDM   Final diagnoses:  Leg laceration, right, initial encounter    Patient presents with a described mechanical fall off the bed. Recent history of fall for which she was evaluated last week. She has evidence of a laceration to the right leg. Otherwise no other obvious new injuries. Tetanus was updated. Plain films are negative for fracture. Injury was repaired at the bedside. Removal of sutures in 10 days. Patient denies hitting her head or loss of consciousness. No further indication for imaging or workup at this time.  After history, exam, and medical workup I feel the patient has been appropriately medically screened and is safe for discharge home. Pertinent diagnoses were discussed with the patient. Patient was given return precautions.  I personally performed the services described in this documentation, which was scribed in my presence. The recorded information has been reviewed and is accurate.    Merryl Hacker, MD 07/22/15 [phone]

## 2015-07-30 DIAGNOSIS — Z4802 Encounter for removal of sutures: Secondary | ICD-10-CM | POA: Diagnosis not present

## 2015-07-30 DIAGNOSIS — L0889 Other specified local infections of the skin and subcutaneous tissue: Secondary | ICD-10-CM | POA: Diagnosis not present

## 2015-07-30 DIAGNOSIS — N183 Chronic kidney disease, stage 3 (moderate): Secondary | ICD-10-CM | POA: Diagnosis not present

## 2015-08-01 DIAGNOSIS — L0889 Other specified local infections of the skin and subcutaneous tissue: Secondary | ICD-10-CM | POA: Diagnosis not present

## 2015-08-01 DIAGNOSIS — Z6823 Body mass index (BMI) 23.0-23.9, adult: Secondary | ICD-10-CM | POA: Diagnosis not present

## 2015-08-01 DIAGNOSIS — N183 Chronic kidney disease, stage 3 (moderate): Secondary | ICD-10-CM | POA: Diagnosis not present

## 2015-08-06 DIAGNOSIS — N183 Chronic kidney disease, stage 3 (moderate): Secondary | ICD-10-CM | POA: Diagnosis not present

## 2015-08-19 ENCOUNTER — Telehealth: Payer: Self-pay | Admitting: Neurology

## 2015-08-19 NOTE — Telephone Encounter (Signed)
Spoke to patient - feels Xeomin treatment has not been helpful for her symptoms.  She would like to further discuss treatment options.  A follow up appointment has been scheduled with Dr. Jannifer Franklin.

## 2015-08-19 NOTE — Telephone Encounter (Signed)
Patient called to cancel 8/24 Xeomin appointment, states it didn't help at all, "hurt her heart while they were doing it". Also requests to go back to neuro rehab next door, if needs to be referred again, please send referral.

## 2015-08-27 DIAGNOSIS — D649 Anemia, unspecified: Secondary | ICD-10-CM | POA: Diagnosis not present

## 2015-08-27 DIAGNOSIS — W19XXXA Unspecified fall, initial encounter: Secondary | ICD-10-CM | POA: Diagnosis not present

## 2015-08-27 DIAGNOSIS — N184 Chronic kidney disease, stage 4 (severe): Secondary | ICD-10-CM | POA: Diagnosis not present

## 2015-08-27 DIAGNOSIS — Z789 Other specified health status: Secondary | ICD-10-CM | POA: Diagnosis not present

## 2015-08-27 DIAGNOSIS — I9589 Other hypotension: Secondary | ICD-10-CM | POA: Diagnosis not present

## 2015-08-28 ENCOUNTER — Other Ambulatory Visit: Payer: Self-pay | Admitting: Nephrology

## 2015-08-28 DIAGNOSIS — N184 Chronic kidney disease, stage 4 (severe): Secondary | ICD-10-CM

## 2015-08-29 ENCOUNTER — Ambulatory Visit (INDEPENDENT_AMBULATORY_CARE_PROVIDER_SITE_OTHER): Payer: Medicare Other | Admitting: Neurology

## 2015-08-29 ENCOUNTER — Encounter: Payer: Self-pay | Admitting: Neurology

## 2015-08-29 VITALS — BP 89/53 | HR 66 | Ht 64.0 in | Wt 155.5 lb

## 2015-08-29 DIAGNOSIS — R269 Unspecified abnormalities of gait and mobility: Secondary | ICD-10-CM

## 2015-08-29 DIAGNOSIS — R29898 Other symptoms and signs involving the musculoskeletal system: Secondary | ICD-10-CM

## 2015-08-29 MED ORDER — BACLOFEN 10 MG PO TABS: 5.0000 mg | ORAL_TABLET | Freq: Two times a day (BID) | ORAL | Status: DC

## 2015-08-29 NOTE — Progress Notes (Signed)
Reason for visit: Cervical dystonia  Victoria Fisher is an 81 y.o. female  History of present illness:  Victoria Fisher is a 81 year old right-handed white female with a history of problems with anterior flexion and rightward rotation of the cervical spine that has been present for about 3 years. The patient has discomfort associated with this, and the last several months she has had increasing problems with swallowing solids as they seem to get stuck in the throat, and she will have to vomit the food back up. The patient has been sent for botulism toxin therapy, she had one treatment but this was not helpful. The patient indicates that the treatment gave her angina pain. She has a gait disorder, she uses a walker for ambulation, she denies any recent falls. She indicates that in the past physical therapy was beneficial. When she lies back in bed, her neck will seemed to straighten out and the pain improves.  Past Medical History  Diagnosis Date  . Anxiety   . Arthritis   . COPD (chronic obstructive pulmonary disease) ([place])   . Emphysema of lung ([place])   . Tuberculosis   . MI (myocardial infarction) ([place]) 1999    Unstable angina  . Lung nodule   . Diverticulosis of colon   . Depression   . Milroy's disease     L[facility]atic [facility] at [facility]  . Chronic anticoagulation     A fib. On Coumadin.  . IBS (irritable bowel syndrome)   . Edema of both legs     Venous Doppler 05/11/12 was normal & no evidence of thrombus or thrombophlebitis. Chronic LE edema related to both cor pulmonale & lymphedema, hyperlipidemia & HTN involving coronary atherosclerosis by cardiac cath in 2004. Cath showed a tiny old occlusion of the optional diagonal branch & also proximal LAD.  [name] Chronic pain syndrome   . Hyperlipidemia     On a statin. Catheterization 02/12/03 Showed a tiny old occlusion of the optional diagonal branch & also proximal LAD.  [name] Hypertension     Myoview 07/2011 EF = >55%. RV was  moderately dilated. LA was severely dilated. Catheterization 02/12/03 Showed a tiny old occlusion of the optional diagonal branch & also proximal LAD.  [name] Coronary artery disease   . Coronary atherosclerosis     Mild. Myoview 07/2011 EF = >55%. RV was moderately dilated. LA was severely dilated. Catheterization 02/12/03 Showed a tiny old occlusion of the optional diagonal branch & also proximal LAD.  [name] Chronic cor pulmonale (HCC)     Most likely due to COPD (cannot rule out contribution of diastolic LV dysfunction). ECHO 07/30/11 pulmonary artery pressure was estimated to be 50-60% mmHg & there was moderate tricuspid insufficiency.  . Frequent falls     Possibly due to orthostatic hypotension but we have not seen that on our visits. Recent fall 05/2012 with scalp hematoma, swollen left LE, & black eyes.  . Obstructive sleep apnea     Polysomnogram 09/25/11 AHI: 53.6/hr overall & 57.1/hr during REM sleep. Poor compliance previously with CPAP  . Pulmonary hypertension ([place])     Pullmonary hypertension, chronic cor pulmonale w/pulmonary artery pressures of 50&#x2011; 60 mmHg.  . CHF (congestive heart failure) (HCC)     Acute on chronic, diastolic, improved. Predominantly right heart failure due to cor pulmonale & in turn this is most likely due to untreated OSA.  [name] Dyspnea on exertion     Reluctant to take diuretics  . Atrial fibrillation, chronic (  [place])     On Coumadin & a beta-blocker  . Colon cancer ([place])     Carcinoma of sigmoid colon 1984 & 1985.  [name] Bowel obstruction (HCC)     Caused by scar tissue  . Hypothyroidism 2002    On hormone replacement therapy  . Asthma     On nebulizer  . Insomnia   . 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 Abnormality of gait 01/30/2015    Past Surgical History  Procedure Laterality Date  . Appendectomy  1956  . Abdominal hysterectomy  1983  . Colon resection      2 times  . Cholecystectomy  1984  . Colon biopsy    . Cardiac catheterization  02/12/03     Showed a tiny old occlusion of the optional diagonal branch & also proximal LAD.  [name] Colonoscopy  07/23/10    Family History  Problem Relation Age of Onset  . Heart disease Father   . Alzheimer's disease Mother   . Parkinsonism Brother   . Diabetes Brother     Social history:  reports that she has never smoked. She has never used smokeless tobacco. She reports that she does not drink alcohol or use illicit drugs.    Allergies  Allergen Reactions  . Codeine Anaphylaxis, Hives and Nausea And Vomiting  . Ciprofloxacin Hives and Nausea And Vomiting  . Latex Hives and Rash  . Penicillins Hives    Has patient had a PCN reaction causing immediate rash, facial/tongue/throat swelling, SOB or lightheadedness with hypotension: Yes Has patient had a PCN reaction causing severe rash involving mucus membranes or skin necrosis: Yes Has patient had a PCN reaction that required hospitalization Yes Has patient had a PCN reaction occurring within the last 10 years: Yes If all of the above answers are "NO", then may proceed with Cephalosporin use.  . Sulfonamide Derivatives Hives and Rash    Medications:  Prior to Admission medications   Medication Sig Start Date End Date Taking? Authorizing Provider  aspirin EC 81 MG tablet Take 81 mg by mouth every morning.    Yes Historical Provider, MD  atorvastatin (LIPITOR) 40 MG tablet Take 40 mg by mouth daily.   Yes Historical Provider, MD  cholecalciferol (VITAMIN D) 1000 UNITS tablet Take 1,000 Units by mouth daily.   Yes Historical Provider, MD  ferrous sulfate 325 (65 FE) MG tablet TK 1 T PO ONCE D 02/15/15  Yes Historical Provider, MD  levalbuterol (XOPENEX HFA) 45 MCG/ACT inhaler Inhale 1 puff into the lungs every 4 (four) hours as needed for wheezing.   Yes Historical Provider, MD  levothyroxine (SYNTHROID, LEVOTHROID) 112 MCG tablet Take 1 tablet (112 mcg total) by mouth every morning. 06/30/13  Yes Mihai Croitoru, MD  magnesium oxide (MAG-OX) 400  (241.3 MG) MG tablet Take 1 tablet (400 mg total) by mouth daily. 07/11/13  Yes Haywood Pao, MD  metoprolol succinate (TOPROL-XL) 25 MG 24 hr tablet Take 1 tablet (25 mg total) by mouth daily. 07/11/13  Yes Haywood Pao, MD  morphine (MS CONTIN) 60 MG 12 hr tablet Take 60 mg by mouth every 12 (twelve) hours.   Yes Historical Provider, MD  morphine (MSIR) 30 MG tablet Take 30 mg by mouth 3 (three) times daily as needed (breakthrough pain).    Yes Historical Provider, MD  nitroGLYCERIN (NITROLINGUAL) 0.4 MG/SPRAY spray Place 1 spray under the tongue every 5 (five) minutes as needed for chest pain. 12/13/12  Yes Sanda Klein, MD  potassium chloride (K-DUR,KLOR-CON) 10 MEQ tablet Take 10 mEq by mouth daily.    Yes Historical Provider, MD  sennosides-docusate sodium (SENOKOT-S) 8.6-50 MG tablet Take 2-3 tablets by mouth daily as needed for constipation.   Yes Historical Provider, MD  sertraline (ZOLOFT) 100 MG tablet Take 100 mg by mouth daily.   Yes Historical Provider, MD  torsemide (DEMADEX) 20 MG tablet Take 20-40 mg by mouth 2 (two) times daily. Take 40 mg every morning and 20 mg every evening   Yes Historical Provider, MD  vitamin E 400 UNIT capsule Take 400 Units by mouth daily.   Yes Historical Provider, MD  zolpidem (AMBIEN CR) 6.25 MG CR tablet Take 6.25 mg by mouth at bedtime. Take 0.5-1.0 tablets as needed for sleep.   Yes Historical Provider, MD  baclofen (LIORESAL) 10 MG tablet Take 0.5 tablets (5 mg total) by mouth 2 (two) times daily. 08/29/15   Kathrynn Ducking, MD    ROS:  Out of a complete 14 system review of symptoms, the patient complains only of the following symptoms, and all other reviewed systems are negative.  Eye redness Shortness of breath Leg swelling Cold intolerance Vomiting Frequency of urination, urinary urgency Bruising easily, anemia Dizziness  Blood pressure 89/53, pulse 66, height 5\' 4"  (1.626 m), weight 155 lb 8 oz (70.534 kg).  Physical  Exam  General: The patient is alert and cooperative at the time of the examination.  Neuromuscular: The head is flexed and rotated to the right. The patient is unable to extend the head.  Skin: No significant peripheral edema is noted.   Neurologic Exam  Mental status: The patient is alert and oriented x 3 at the time of the examination. The patient has apparent normal recent and remote memory, with an apparently normal attention span and concentration ability.   Cranial nerves: Facial symmetry is present. Speech is normal, no aphasia or dysarthria is noted. Extraocular movements are full. Visual fields are full.  Motor: The patient has good strength in all 4 extremities.  Sensory examination: Soft touch sensation is symmetric on the face, arms, and legs.  Coordination: The patient has good finger-nose-finger and heel-to-shin bilaterally.  Gait and station: The patient has a slightly wide-based gait, the patient uses a walker for ambulation. Tandem gait was not attempted. Romberg is negative. No drift is seen.  Reflexes: Deep tendon reflexes are symmetric.   Assessment/Plan:  1. Cervical dystonia  2. Gait disorder  3. Reports of dysphagia  The patient has reported some issues swallowing solids in the last several months, she does not wish to pursue a speech therapy evaluation for this. The patient will be placed on low-dose baclofen taking 5 mg twice daily, a prescription was called in. The patient will be sent back for physical therapy for neuromuscular therapy on the neck and shoulders. Unfortunately she did not seem to respond to the botulism toxin therapy. She will follow-up in about 5 months.  Jill Alexanders MD 08/29/2015 8:22 PM  [facility] [address] [place], [place] [address]  Phone [phone] Fax [phone]

## 2015-09-03 ENCOUNTER — Telehealth: Payer: Self-pay | Admitting: Neurology

## 2015-09-03 NOTE — Telephone Encounter (Addendum)
Zigmund Daniel Smith/[name] [phone] called to request order for baclofen (LIORESAL) 10 MG tablet. Also states, patient was referred to Dr. Jamal Maes for decreased renal function, was seen for 1st time within the last week or so, wasn't sure if Dr. Krista Blue was aware of this when she prescribed Baclofen.

## 2015-09-04 NOTE — Telephone Encounter (Signed)
I called and left a message for [employer].  Okay to use baclofen with chronic  Renal impairment, the patient is not on hemodialysis. You need to adjust the dose lower in someone who has poor renal function, the patient was started only on 5 mg twice daily.

## 2015-09-05 ENCOUNTER — Ambulatory Visit: Payer: Medicare Other | Admitting: Rehabilitative and Restorative Service Providers"

## 2015-09-05 DIAGNOSIS — N19 Unspecified kidney failure: Secondary | ICD-10-CM

## 2015-09-05 HISTORY — DX: Unspecified kidney failure: N19

## 2015-09-09 ENCOUNTER — Emergency Department (HOSPITAL_COMMUNITY): Payer: Medicare Other

## 2015-09-09 ENCOUNTER — Encounter (HOSPITAL_COMMUNITY): Payer: Self-pay | Admitting: Emergency Medicine

## 2015-09-09 ENCOUNTER — Inpatient Hospital Stay (HOSPITAL_COMMUNITY)
Admission: EM | Admit: 2015-09-09 | Discharge: 2015-09-13 | DRG: 917 | Disposition: A | Discharge status: Home Health | Payer: Medicare Other | Attending: Internal Medicine | Admitting: Internal Medicine

## 2015-09-09 DIAGNOSIS — I82403 Acute embolism and thrombosis of unspecified deep veins of lower extremity, bilateral: Secondary | ICD-10-CM | POA: Diagnosis not present

## 2015-09-09 DIAGNOSIS — S1083XA Contusion of other specified part of neck, initial encounter: Secondary | ICD-10-CM | POA: Diagnosis not present

## 2015-09-09 DIAGNOSIS — W19XXXA Unspecified fall, initial encounter: Secondary | ICD-10-CM | POA: Diagnosis present

## 2015-09-09 DIAGNOSIS — E162 Hypoglycemia, unspecified: Secondary | ICD-10-CM | POA: Diagnosis present

## 2015-09-09 DIAGNOSIS — R269 Unspecified abnormalities of gait and mobility: Secondary | ICD-10-CM | POA: Diagnosis not present

## 2015-09-09 DIAGNOSIS — I130 Hypertensive heart and chronic kidney disease with heart failure and stage 1 through stage 4 chronic kidney disease, or unspecified chronic kidney disease: Secondary | ICD-10-CM | POA: Diagnosis present

## 2015-09-09 DIAGNOSIS — I2510 Atherosclerotic heart disease of native coronary artery without angina pectoris: Secondary | ICD-10-CM | POA: Diagnosis present

## 2015-09-09 DIAGNOSIS — G894 Chronic pain syndrome: Secondary | ICD-10-CM | POA: Diagnosis not present

## 2015-09-09 DIAGNOSIS — S060X9A Concussion with loss of consciousness of unspecified duration, initial encounter: Secondary | ICD-10-CM | POA: Diagnosis not present

## 2015-09-09 DIAGNOSIS — J410 Simple chronic bronchitis: Secondary | ICD-10-CM

## 2015-09-09 DIAGNOSIS — G92 Toxic encephalopathy: Secondary | ICD-10-CM | POA: Diagnosis present

## 2015-09-09 DIAGNOSIS — G9340 Encephalopathy, unspecified: Secondary | ICD-10-CM | POA: Diagnosis not present

## 2015-09-09 DIAGNOSIS — F411 Generalized anxiety disorder: Secondary | ICD-10-CM

## 2015-09-09 DIAGNOSIS — T148 Other injury of unspecified body region: Secondary | ICD-10-CM

## 2015-09-09 DIAGNOSIS — Z882 Allergy status to sulfonamides status: Secondary | ICD-10-CM

## 2015-09-09 DIAGNOSIS — I950 Idiopathic hypotension: Secondary | ICD-10-CM | POA: Diagnosis present

## 2015-09-09 DIAGNOSIS — Z885 Allergy status to narcotic agent status: Secondary | ICD-10-CM

## 2015-09-09 DIAGNOSIS — D539 Nutritional anemia, unspecified: Secondary | ICD-10-CM | POA: Diagnosis present

## 2015-09-09 DIAGNOSIS — X58XXXA Exposure to other specified factors, initial encounter: Secondary | ICD-10-CM | POA: Diagnosis present

## 2015-09-09 DIAGNOSIS — I10 Essential (primary) hypertension: Secondary | ICD-10-CM | POA: Diagnosis present

## 2015-09-09 DIAGNOSIS — T68XXXA Hypothermia, initial encounter: Secondary | ICD-10-CM | POA: Diagnosis not present

## 2015-09-09 DIAGNOSIS — Z91040 Latex allergy status: Secondary | ICD-10-CM

## 2015-09-09 DIAGNOSIS — D649 Anemia, unspecified: Secondary | ICD-10-CM | POA: Diagnosis not present

## 2015-09-09 DIAGNOSIS — Z8249 Family history of ischemic heart disease and other diseases of the circulatory system: Secondary | ICD-10-CM

## 2015-09-09 DIAGNOSIS — S060XAA Concussion with loss of consciousness status unknown, initial encounter: Secondary | ICD-10-CM

## 2015-09-09 DIAGNOSIS — G4733 Obstructive sleep apnea (adult) (pediatric): Secondary | ICD-10-CM | POA: Diagnosis present

## 2015-09-09 DIAGNOSIS — S0083XA Contusion of other part of head, initial encounter: Secondary | ICD-10-CM | POA: Diagnosis not present

## 2015-09-09 DIAGNOSIS — T50991A Poisoning by other drugs, medicaments and biological substances, accidental (unintentional), initial encounter: Principal | ICD-10-CM | POA: Diagnosis present

## 2015-09-09 DIAGNOSIS — I272 Other secondary pulmonary hypertension: Secondary | ICD-10-CM | POA: Diagnosis present

## 2015-09-09 DIAGNOSIS — Q820 Hereditary lymphedema: Secondary | ICD-10-CM

## 2015-09-09 DIAGNOSIS — R52 Pain, unspecified: Secondary | ICD-10-CM

## 2015-09-09 DIAGNOSIS — R296 Repeated falls: Secondary | ICD-10-CM | POA: Diagnosis present

## 2015-09-09 DIAGNOSIS — R680 Hypothermia, not associated with low environmental temperature: Secondary | ICD-10-CM | POA: Diagnosis present

## 2015-09-09 DIAGNOSIS — I2781 Cor pulmonale (chronic): Secondary | ICD-10-CM | POA: Diagnosis present

## 2015-09-09 DIAGNOSIS — R7309 Other abnormal glucose: Secondary | ICD-10-CM | POA: Diagnosis not present

## 2015-09-09 DIAGNOSIS — E160 Drug-induced hypoglycemia without coma: Secondary | ICD-10-CM | POA: Diagnosis present

## 2015-09-09 DIAGNOSIS — Z7901 Long term (current) use of anticoagulants: Secondary | ICD-10-CM

## 2015-09-09 DIAGNOSIS — I959 Hypotension, unspecified: Secondary | ICD-10-CM | POA: Diagnosis present

## 2015-09-09 DIAGNOSIS — S299XXA Unspecified injury of thorax, initial encounter: Secondary | ICD-10-CM | POA: Diagnosis not present

## 2015-09-09 DIAGNOSIS — F329 Major depressive disorder, single episode, unspecified: Secondary | ICD-10-CM | POA: Diagnosis present

## 2015-09-09 DIAGNOSIS — E785 Hyperlipidemia, unspecified: Secondary | ICD-10-CM | POA: Diagnosis present

## 2015-09-09 DIAGNOSIS — Z881 Allergy status to other antibiotic agents status: Secondary | ICD-10-CM

## 2015-09-09 DIAGNOSIS — T07XXXA Unspecified multiple injuries, initial encounter: Secondary | ICD-10-CM | POA: Insufficient documentation

## 2015-09-09 DIAGNOSIS — J449 Chronic obstructive pulmonary disease, unspecified: Secondary | ICD-10-CM | POA: Diagnosis present

## 2015-09-09 DIAGNOSIS — N183 Chronic kidney disease, stage 3 unspecified: Secondary | ICD-10-CM | POA: Diagnosis present

## 2015-09-09 DIAGNOSIS — E161 Other hypoglycemia: Secondary | ICD-10-CM | POA: Diagnosis not present

## 2015-09-09 DIAGNOSIS — T40601A Poisoning by unspecified narcotics, accidental (unintentional), initial encounter: Secondary | ICD-10-CM

## 2015-09-09 DIAGNOSIS — Z880 Allergy status to penicillin: Secondary | ICD-10-CM

## 2015-09-09 DIAGNOSIS — R220 Localized swelling, mass and lump, head: Secondary | ICD-10-CM | POA: Diagnosis not present

## 2015-09-09 DIAGNOSIS — K589 Irritable bowel syndrome without diarrhea: Secondary | ICD-10-CM | POA: Diagnosis present

## 2015-09-09 DIAGNOSIS — Z79899 Other long term (current) drug therapy: Secondary | ICD-10-CM

## 2015-09-09 DIAGNOSIS — S298XXA Other specified injuries of thorax, initial encounter: Secondary | ICD-10-CM

## 2015-09-09 DIAGNOSIS — F32A Depression, unspecified: Secondary | ICD-10-CM | POA: Diagnosis present

## 2015-09-09 DIAGNOSIS — Z85038 Personal history of other malignant neoplasm of large intestine: Secondary | ICD-10-CM

## 2015-09-09 DIAGNOSIS — E039 Hypothyroidism, unspecified: Secondary | ICD-10-CM | POA: Diagnosis present

## 2015-09-09 DIAGNOSIS — R911 Solitary pulmonary nodule: Secondary | ICD-10-CM | POA: Diagnosis present

## 2015-09-09 DIAGNOSIS — I482 Chronic atrial fibrillation: Secondary | ICD-10-CM | POA: Diagnosis not present

## 2015-09-09 DIAGNOSIS — I5032 Chronic diastolic (congestive) heart failure: Secondary | ICD-10-CM | POA: Diagnosis not present

## 2015-09-09 DIAGNOSIS — Z7982 Long term (current) use of aspirin: Secondary | ICD-10-CM

## 2015-09-09 DIAGNOSIS — I480 Paroxysmal atrial fibrillation: Secondary | ICD-10-CM | POA: Diagnosis not present

## 2015-09-09 DIAGNOSIS — I252 Old myocardial infarction: Secondary | ICD-10-CM

## 2015-09-09 DIAGNOSIS — I5033 Acute on chronic diastolic (congestive) heart failure: Secondary | ICD-10-CM | POA: Diagnosis present

## 2015-09-09 DIAGNOSIS — T148XXA Other injury of unspecified body region, initial encounter: Secondary | ICD-10-CM

## 2015-09-09 HISTORY — DX: Unspecified kidney failure: N19

## 2015-09-09 LAB — URINALYSIS, ROUTINE W REFLEX MICROSCOPIC
BILIRUBIN URINE: NEGATIVE
Glucose, UA: NEGATIVE mg/dL
HGB URINE DIPSTICK: NEGATIVE
KETONES UR: NEGATIVE mg/dL
Leukocytes, UA: NEGATIVE
NITRITE: NEGATIVE
PH: 5.5 (ref 5.0–8.0)
Protein, ur: NEGATIVE mg/dL
Specific Gravity, Urine: 1.013 (ref 1.005–1.030)

## 2015-09-09 LAB — TROPONIN I: Troponin I: <0.03 ng/mL (ref <0.03)

## 2015-09-09 LAB — CK: Total CK: 244 U/L — ABNORMAL HIGH (ref 38–234)

## 2015-09-09 LAB — CBC WITH DIFFERENTIAL/PLATELET
BASOS ABS: 0 10*3/uL (ref 0.0–0.1)
BASOS PCT: 0 %
EOS PCT: 1 %
Eosinophils Absolute: 0.1 10*3/uL (ref 0.0–0.7)
HCT: 28.2 % — ABNORMAL LOW (ref 36.0–46.0)
Hemoglobin: 8.5 g/dL — ABNORMAL LOW (ref 12.0–15.0)
Lymphocytes Relative: 9 %
Lymphs Abs: 0.6 10*3/uL — ABNORMAL LOW (ref 0.7–4.0)
MCH: 31 pg (ref 26.0–34.0)
MCHC: 30.1 g/dL (ref 30.0–36.0)
MCV: 102.9 fL — ABNORMAL HIGH (ref 78.0–100.0)
MONO ABS: 0.5 10*3/uL (ref 0.1–1.0)
MONOS PCT: 7 %
Neutro Abs: 5.4 10*3/uL (ref 1.7–7.7)
Neutrophils Relative %: 83 %
PLATELETS: 125 10*3/uL — AB (ref 150–400)
RBC: 2.74 MIL/uL — ABNORMAL LOW (ref 3.87–5.11)
RDW: 14.9 % (ref 11.5–15.5)
WBC: 6.6 10*3/uL (ref 4.0–10.5)

## 2015-09-09 LAB — CBG MONITORING, ED
GLUCOSE-CAPILLARY: 127 mg/dL — AB (ref 65–99)
GLUCOSE-CAPILLARY: 33 mg/dL — AB (ref 65–99)
GLUCOSE-CAPILLARY: 24 mg/dL — AB (ref 65–99)
GLUCOSE-CAPILLARY: 56 mg/dL — AB (ref 65–99)
GLUCOSE-CAPILLARY: 74 mg/dL (ref 65–99)
Glucose-Capillary: 82 mg/dL (ref 65–99)
Glucose-Capillary: 45 mg/dL — ABNORMAL LOW (ref 65–99)
Glucose-Capillary: 67 mg/dL (ref 65–99)

## 2015-09-09 LAB — COMPREHENSIVE METABOLIC PANEL
ALBUMIN: 3.4 g/dL — AB (ref 3.5–5.0)
ALT: 10 U/L — AB (ref 14–54)
AST: 23 U/L (ref 15–41)
Alkaline Phosphatase: 58 U/L (ref 38–126)
Anion gap: 7 (ref 5–15)
BILIRUBIN TOTAL: 1.1 mg/dL (ref 0.3–1.2)
BUN: 66 mg/dL — AB (ref 6–20)
CHLORIDE: 100 mmol/L — AB (ref 101–111)
CO2: 30 mmol/L (ref 22–32)
CREATININE: 1.83 mg/dL — AB (ref 0.44–1.00)
Calcium: 8.9 mg/dL (ref 8.9–10.3)
GFR calc Af Amer: 29 mL/min — ABNORMAL LOW (ref >60)
GFR, EST NON AFRICAN AMERICAN: 25 mL/min — AB (ref >60)
GLUCOSE: 139 mg/dL — AB (ref 65–99)
POTASSIUM: 3.8 mmol/L (ref 3.5–5.1)
Sodium: 137 mmol/L (ref 135–145)
Total Protein: 6.4 g/dL — ABNORMAL LOW (ref 6.5–8.1)

## 2015-09-09 LAB — BRAIN NATRIURETIC PEPTIDE: B NATRIURETIC PEPTIDE 5: 275.1 pg/mL — AB (ref 0.0–100.0)

## 2015-09-09 LAB — I-STAT CG4 LACTIC ACID, ED: Lactic Acid, Venous: 0.98 mmol/L (ref 0.5–1.9)

## 2015-09-09 LAB — TSH: TSH: 1.696 u[IU]/mL (ref 0.350–4.500)

## 2015-09-09 LAB — GLUCOSE, CAPILLARY: Glucose-Capillary: 48 mg/dL — ABNORMAL LOW (ref 65–99)

## 2015-09-09 LAB — PROTIME-INR
INR: 1.19 (ref 0.00–1.49)
Prothrombin Time: 15.2 seconds (ref 11.6–15.2)

## 2015-09-09 MED ORDER — DEXTROSE 10 % IV SOLN
INTRAVENOUS | Status: DC
  Administered 2015-09-09: 21:00:00 via INTRAVENOUS
  Filled 2015-09-09: qty 1000

## 2015-09-09 MED ORDER — DEXTROSE 50 % IV SOLN
50.0000 mL | Freq: Once | INTRAVENOUS | Status: AC
  Administered 2015-09-09: 50 mL via INTRAVENOUS

## 2015-09-09 MED ORDER — DEXTROSE 50 % IV SOLN
INTRAVENOUS | Status: AC
  Administered 2015-09-09: 50 mL
  Filled 2015-09-09: qty 50

## 2015-09-09 MED ORDER — DEXTROSE 50 % IV SOLN
INTRAVENOUS | Status: AC
  Administered 2015-09-09: 50 mL via INTRAVENOUS
  Filled 2015-09-09: qty 50

## 2015-09-09 MED ORDER — ACETAMINOPHEN 650 MG RE SUPP: 650.0000 mg | Freq: Four times a day (QID) | RECTAL | Status: DC | PRN

## 2015-09-09 MED ORDER — ACETAMINOPHEN 325 MG PO TABS
650.0000 mg | ORAL_TABLET | Freq: Four times a day (QID) | ORAL | Status: DC | PRN
  Administered 2015-09-11: 650 mg via ORAL
  Filled 2015-09-09: qty 2

## 2015-09-09 MED ORDER — NITROGLYCERIN 0.4 MG/SPRAY TL SOLN
1.0000 | Status: DC | PRN
  Filled 2015-09-09: qty 4.9

## 2015-09-09 MED ORDER — SODIUM CHLORIDE 0.9 % IV BOLUS (SEPSIS): 500.0000 mL | Freq: Once | INTRAVENOUS | Status: DC

## 2015-09-09 MED ORDER — SODIUM CHLORIDE 0.9 % IV BOLUS (SEPSIS)
1000.0000 mL | Freq: Once | INTRAVENOUS | Status: AC
  Administered 2015-09-09: 1000 mL via INTRAVENOUS

## 2015-09-09 MED ORDER — DEXTROSE 10 % IV SOLN: INTRAVENOUS | Status: DC

## 2015-09-09 MED ORDER — HYDROCORTISONE NA SUCCINATE PF 100 MG IJ SOLR
50.0000 mg | Freq: Four times a day (QID) | INTRAMUSCULAR | Status: DC
  Administered 2015-09-09 – 2015-09-10 (×3): 50 mg via INTRAVENOUS
  Filled 2015-09-09 (×3): qty 2

## 2015-09-09 MED ORDER — DEXTROSE 50 % IV SOLN
50.0000 mL | INTRAVENOUS | Status: DC | PRN
  Administered 2015-09-10 (×2): 50 mL via INTRAVENOUS
  Filled 2015-09-09 (×2): qty 50

## 2015-09-09 MED ORDER — LEVOTHYROXINE SODIUM 100 MCG IV SOLR
62.5000 ug | Freq: Every day | INTRAVENOUS | Status: DC
  Administered 2015-09-10: 62.5 ug via INTRAVENOUS
  Filled 2015-09-09: qty 5

## 2015-09-09 MED ORDER — SODIUM CHLORIDE 0.9% FLUSH
3.0000 mL | Freq: Two times a day (BID) | INTRAVENOUS | Status: DC
  Administered 2015-09-10 – 2015-09-12 (×4): 3 mL via INTRAVENOUS

## 2015-09-09 MED ORDER — HYDROXYZINE HCL 50 MG/ML IM SOLN: 25.0000 mg | Freq: Four times a day (QID) | INTRAMUSCULAR | Status: DC | PRN

## 2015-09-09 MED ORDER — TRIAMCINOLONE ACETONIDE 0.1 % EX CREA
1.0000 "application " | TOPICAL_CREAM | Freq: Every day | CUTANEOUS | Status: DC | PRN
  Filled 2015-09-09: qty 15

## 2015-09-09 MED ORDER — ALBUTEROL SULFATE (2.5 MG/3ML) 0.083% IN NEBU: 2.5000 mg | INHALATION_SOLUTION | RESPIRATORY_TRACT | Status: DC | PRN

## 2015-09-09 MED ORDER — ASPIRIN 300 MG RE SUPP
150.0000 mg | Freq: Every day | RECTAL | Status: DC
  Administered 2015-09-10: 150 mg via RECTAL
  Filled 2015-09-09: qty 1

## 2015-09-09 MED ORDER — DEXTROSE-NACL 5-0.9 % IV SOLN
Freq: Once | INTRAVENOUS | Status: AC
  Administered 2015-09-09: 21:00:00 via INTRAVENOUS

## 2015-09-09 MED ORDER — METOPROLOL TARTRATE 5 MG/5ML IV SOLN: 2.5000 mg | Freq: Three times a day (TID) | INTRAVENOUS | Status: DC | PRN

## 2015-09-09 NOTE — ED Notes (Signed)
CBG 33. MD made aware

## 2015-09-09 NOTE — ED Notes (Signed)
Patient alert and gave this nurse her son's number so she could call him.

## 2015-09-09 NOTE — ED Notes (Signed)
Care given to [facility] [place], [place]

## 2015-09-09 NOTE — ED Notes (Signed)
Pt when to CT

## 2015-09-09 NOTE — ED Notes (Signed)
CBG is 45.  °

## 2015-09-09 NOTE — ED Notes (Signed)
CBG 127 

## 2015-09-09 NOTE — ED Notes (Signed)
CBG is 24.

## 2015-09-09 NOTE — ED Provider Notes (Signed)
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 hypoglycemic Improving with D50 She is more alert Imaging/labs ordered She is hypoglycemia and hypothermic but no h/o diabetes 8:11 PM Pt with episode of hypoglycemia/hypotension IV fluids and D50 ordered Pt arousable Will follow  9:13 PM PT WITH CONTINUED HYPOGLYCEMIA MORE DEXTROSE ORDERED UNCLEAR CAUSE AS NOT DIABETIC SHE DOES NOT APPEAR SEPTIC (LACTATE NORMAL, NO UTI AND CXR NEGATIVE) HER BP IS LOW HOWEVER PT HAS H/O LOW BLOOD PRESSURE ON PREVIOUS OFFICE VISITS D/W DR Blaine Hamper WITH TRIAD FOR ADMISSION TO STEPDOWN UNIT PT REMAINS AROUSABLE  Final Clinical Impressions(s) / ED Diagnoses   Final diagnoses:  Hypoglycemia  Anemia, unspecified anemia type  Contusion of multiple sites  Blunt chest trauma, initial encounter  Concussion, with loss of consciousness of unspecified duration, initial encounter    New Prescriptions New Prescriptions   No medications on file     Ripley Fraise, MD 09/09/15 [id number]

## 2015-09-09 NOTE — H&P (Addendum)
History and Physical    Victoria Fisher [id number] DOB: 1935/02/04 DOA: 09/09/2015  Referring MD/NP/PA:   PCP: Haywood Pao, MD   Patient coming from:  The patient is coming from independent living facility.  At baseline, pt is partially dependent for her ADL.  Chief Complaint: AMS, fall, hypoglycemia and hypothermia  HPI: Victoria Fisher is a 81 y.o. female with medical history significant of hypertension, hyperlipidemia, COPD, asthma, hypothyroidism, depression, anxiety, atrial fibrillation not on anticoagulants, CKD-III, dCHF, OSA not on CPAP, TB, frequent fall, colon cancer (s/p of colon resection), IBS, chronically wearing neck brace, wh presents with AMS and fall.  Patient has AMS and is able to provide only very limited and partial medical history, therefore, most of the history is obtained by discussing the case with ED physician, per EMS report, and with the nursing staff.  It seems that pt lives in assisted living facility, and was found to in floor by husband with unresponsiveness today. She was at her normal baseline yesterday. Per EMS, initial CBG = 50, which improved to 90 after giving D50, but CBG dropped to 30 on arrival to this ER. Per report, pt fell 3 days ago, but refused evaluation. She has multiple bruises, including left face, left eye and left upper chest. Apparently she has chronic neck condition and is usually deviating head to left, which is her baseline. Her bp is soft, 97/60-->improved to 101/63 after 1L NS, but dropped to 86/59 (MAP 70) again. When I saw pt in ED, she could answer some questions. She reported that she had chest pain early, but no chest pain currently. She does not have active coughing, nausea, vomiting, diarrhea. She has bilateral leg edema. She moves all extremities.  ED Course: pt was found to have soft Bp, hypothermia with temperature 94.5--> warm blanket applied, hypoglycemia-->started D10, WBC 6.6, heart rate 60s, respiration rate 15,  oxygen saturation 96% on room air, lactate 0.98, INR 1.19, negative troponin, CK 244, negative urinalysis, chest x-ray has vascular congestion without infiltration. Pt is admitted to stepdown for further events and treatment as inpt.  # Ct scan of head, neck and maxillofacial: 1. no acute intracranial or calvarial findings. 2. no evidence of acute facial fracture. There is soft tissue injury in the left face. No evidence of orbital hematoma. 3. No evidence of acute cervical spine fracture, traumatic subluxation or static signs of instability. Chronic degenerative anterolisthesis at C3-4 secondary to advanced asymmetric left-sided facet disease appears unchanged. 4. Suspected hematoma involving the left sternocleidomastoid muscle. A mass is difficult to completely exclude based on this examination, although this finding has developed since the prior study of less than 2 months ago.    Review of Systems: Could not be reviewed accurately due to altered mental status.  Allergy:  Allergies  Allergen Reactions  . Codeine Anaphylaxis, Hives and Nausea And Vomiting  . Ciprofloxacin Hives and Nausea And Vomiting  . Latex Hives and Rash  . Penicillins Hives    Has patient had a PCN reaction causing immediate rash, facial/tongue/throat swelling, SOB or lightheadedness with hypotension: Yes Has patient had a PCN reaction causing severe rash involving mucus membranes or skin necrosis: Yes Has patient had a PCN reaction that required hospitalization Yes Has patient had a PCN reaction occurring within the last 10 years: Yes If all of the above answers are "NO", then may proceed with Cephalosporin use.  . Sulfonamide Derivatives Hives and Rash    Past Medical History:  Diagnosis Date  .  Abnormality of gait 01/30/2015  . Anxiety   . Arthritis   . Asthma    On nebulizer  . Atrial fibrillation, chronic (HCC)    On Coumadin & a beta-blocker  . Bowel obstruction (HCC)    Caused by scar tissue  . CHF  (congestive heart failure) (HCC)    Acute on chronic, diastolic, improved. Predominantly right heart failure due to cor pulmonale & in turn this is most likely due to untreated OSA.  [name] Chronic anticoagulation    A fib. On Coumadin.  . Chronic cor pulmonale (HCC)    Most likely due to COPD (cannot rule out contribution of diastolic LV dysfunction). ECHO 07/30/11 pulmonary artery pressure was estimated to be 50-60% mmHg & there was moderate tricuspid insufficiency.  . Chronic pain syndrome   . CKD (chronic kidney disease), stage III   . Colon cancer ([place])    Carcinoma of sigmoid colon 1984 & 1985.  [name] COPD (chronic obstructive pulmonary disease) ([place])   . Coronary artery disease   . Coronary atherosclerosis    Mild. Myoview 07/2011 EF = >55%. RV was moderately dilated. LA was severely dilated. Catheterization 02/12/03 Showed a tiny old occlusion of the optional diagonal branch & also proximal LAD.  [name] Depression   . Diverticulosis of colon   . 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 Dyspnea on exertion    Reluctant to take diuretics  . Edema of both legs    Venous Doppler 05/11/12 was normal & no evidence of thrombus or thrombophlebitis. Chronic LE edema related to both cor pulmonale & lymphedema, hyperlipidemia & HTN involving coronary atherosclerosis by cardiac cath in 2004. Cath showed a tiny old occlusion of the optional diagonal branch & also proximal LAD.  [name] Emphysema of lung ([place])   . Frequent falls    Possibly due to orthostatic hypotension but we have not seen that on our visits. Recent fall 05/2012 with scalp hematoma, swollen left LE, & black eyes.  . Hyperlipidemia    On a statin. Catheterization 02/12/03 Showed a tiny old occlusion of the optional diagonal branch & also proximal LAD.  [name] Hypertension    Myoview 07/2011 EF = >55%. RV was moderately dilated. LA was severely dilated. Catheterization 02/12/03 Showed a tiny old occlusion of the optional diagonal branch & also proximal LAD.  [name]  Hypothyroidism 2002   On hormone replacement therapy  . IBS (irritable bowel syndrome)   . Insomnia   . Kidney failure 09/05/2015   Pt states that she went to the doctor on thursday and was told she is in stage 4 kidney failure  . Lung nodule   . MI (myocardial infarction) ([place]) 1999   Unstable angina  . Milroy's disease    Lymphatic disorder [facility] at [facility]  . Obstructive sleep apnea    Polysomnogram 09/25/11 AHI: 53.6/hr overall & 57.1/hr during REM sleep. Poor compliance previously with CPAP  . Pulmonary hypertension ([place])    Pullmonary hypertension, chronic cor pulmonale w/pulmonary artery pressures of 50&#x2011; 60 mmHg.  . Tuberculosis     Past Surgical History:  Procedure Laterality Date  . ABDOMINAL HYSTERECTOMY  1983  . APPENDECTOMY  1956  . CARDIAC CATHETERIZATION  02/12/03   Showed a tiny old occlusion of the optional diagonal branch & also proximal LAD.  [name] CHOLECYSTECTOMY  1984  . COLON BIOPSY    . COLON RESECTION     2 times  . COLONOSCOPY  07/23/10    Social History:  reports that she  has never smoked. She has never used smokeless tobacco. She reports that she does not drink alcohol or use drugs.  Family History:  Family History  Problem Relation Age of Onset  . Heart disease Father   . Alzheimer's disease Mother   . Parkinsonism Brother   . Diabetes Brother      Prior to Admission medications   Medication Sig Start Date End Date Taking? Authorizing Provider  atorvastatin (LIPITOR) 40 MG tablet Take 40 mg by mouth daily.   Yes Historical Provider, MD  baclofen (LIORESAL) 10 MG tablet Take 0.5 tablets (5 mg total) by mouth 2 (two) times daily. 08/29/15  Yes Kathrynn Ducking, MD  ferrous sulfate 325 (65 FE) MG tablet Take 325 mg by mouth daily.   Yes Historical Provider, MD  levalbuterol (XOPENEX HFA) 45 MCG/ACT inhaler Inhale 1 puff into the lungs every 4 (four) hours as needed for wheezing.   Yes Historical Provider, MD  levothyroxine (SYNTHROID, LEVOTHROID)  112 MCG tablet Take 1 tablet (112 mcg total) by mouth every morning. Patient taking differently: Take 112 mcg by mouth daily before breakfast.  06/30/13  Yes Mihai Croitoru, MD  lisinopril (PRINIVIL,ZESTRIL) 20 MG tablet Take 20 mg by mouth daily. 08/12/15  Yes Historical Provider, MD  metoprolol succinate (TOPROL-XL) 50 MG 24 hr tablet Take 50 mg by mouth daily. 08/12/15  Yes Historical Provider, MD  morphine (MS CONTIN) 60 MG 12 hr tablet Take 60 mg by mouth 2 (two) times daily.    Yes Historical Provider, MD  morphine (MSIR) 30 MG tablet Take 30 mg by mouth 3 (three) times daily as needed (breakthrough pain).    Yes Historical Provider, MD  potassium chloride (K-DUR) 10 MEQ tablet Take 10 mEq by mouth daily. 08/12/15  Yes Historical Provider, MD  sertraline (ZOLOFT) 100 MG tablet Take 100 mg by mouth daily.   Yes Historical Provider, MD  torsemide (DEMADEX) 20 MG tablet Take 20-40 mg by mouth See admin instructions. Take 2 tablets (40 mg) by mouth every morning and 1 tablet (20 mg) at night   Yes Historical Provider, MD  triamcinolone cream (KENALOG) 0.1 % Apply 1 application topically daily as needed (eczema).  07/30/15  Yes Historical Provider, MD  zolpidem (AMBIEN CR) 6.25 MG CR tablet Take 6.25 mg by mouth at bedtime. Take 0.5-1.0 tablets as needed for sleep.   Yes Historical Provider, MD  aspirin EC 81 MG tablet Take 81 mg by mouth every morning.     Historical Provider, MD  cholecalciferol (VITAMIN D) 1000 UNITS tablet Take 1,000 Units by mouth daily.    Historical Provider, MD  magnesium oxide (MAG-OX) 400 (241.3 MG) MG tablet Take 1 tablet (400 mg total) by mouth daily. 07/11/13   Haywood Pao, MD  metoprolol succinate (TOPROL-XL) 25 MG 24 hr tablet Take 1 tablet (25 mg total) by mouth daily. Patient not taking: Reported on 09/09/2015 07/11/13   Haywood Pao, MD  nitroGLYCERIN (NITROLINGUAL) 0.4 MG/SPRAY spray Place 1 spray under the tongue every 5 (five) minutes as needed for chest  pain. 12/13/12   Mihai Croitoru, MD  sennosides-docusate sodium (SENOKOT-S) 8.6-50 MG tablet Take 2-3 tablets by mouth daily as needed for constipation.    Historical Provider, MD  vitamin E 400 UNIT capsule Take 400 Units by mouth daily.    Historical Provider, MD    Physical Exam: Vitals:   09/09/15 2045 09/09/15 2100 09/09/15 2115 09/09/15 2130  BP: (!) 82/57 (!) 85/51 (!) 83/49 [name])  83/58  Pulse: 62 73 61 61  Resp: 17 13 22 13   Temp:      TempSrc:      SpO2: 100% 99% 98% 100%   General: Not in acute distress HEENT:       Eyes: PERRL, EOMI, no scleral icterus.       ENT: No discharge from the ears and nose, no pharynx injection, no tonsillar enlargement.        Neck: No JVD, no bruit, no mass felt. Heme: No neck lymph node enlargement. Cardiac: S1/S2, irregularly irregular rhythm, No murmurs, No gallops or rubs. Pulm: has scattered rhonchi. No rales, wheezing or rubs. Abd: Soft, nondistended, nontender, no organomegaly, BS present. GU: No hematuria Ext: No pitting leg edema bilaterally. 2+DP/PT pulse bilaterally. Musculoskeletal: No joint deformities, No joint redness or warmth, no limitation of ROM in spin. Skin: Has multiple bruises over left face, left eye and left upper chest. Neuro: confused, oriented to place, knows her own name, cranial nerves II-XII grossly intact, moves all extremities. Psych: Patient is not psychotic, no suicidal or hemocidal ideation.  Labs on Admission: I have personally reviewed following labs and imaging studies  CBC:  Recent Labs Lab 09/09/15 1810  WBC 6.6  NEUTROABS 5.4  HGB 8.5*  HCT 28.2*  MCV 102.9*  PLT 0000000*   Basic Metabolic Panel:  Recent Labs Lab 09/09/15 1810  NA 137  K 3.8  CL 100*  CO2 30  GLUCOSE 139*  BUN 66*  CREATININE 1.83*  CALCIUM 8.9   GFR: Estimated Creatinine Clearance: 24 mL/min (by C-G formula based on SCr of 1.83 mg/dL). Liver Function Tests:  Recent Labs Lab 09/09/15 1810  AST 23  ALT 10*    ALKPHOS 58  BILITOT 1.1  PROT 6.4*  ALBUMIN 3.4*   No results for input(s): LIPASE, AMYLASE in the last 168 hours. No results for input(s): AMMONIA in the last 168 hours. Coagulation Profile:  Recent Labs Lab 09/09/15 1821  INR 1.19   Cardiac Enzymes:  Recent Labs Lab 09/09/15 1810 09/09/15 1821  CKTOTAL  --  244*  TROPONINI <0.03  --    BNP (last 3 results) No results for input(s): PROBNP in the last 8760 hours. HbA1C: No results for input(s): HGBA1C in the last 72 hours. CBG:  Recent Labs Lab 09/09/15 1947 09/09/15 2025 09/09/15 2047 09/09/15 2107 09/09/15 2146  GLUCAP 24* 82 56* 45* 67   Lipid Profile: No results for input(s): CHOL, HDL, LDLCALC, TRIG, CHOLHDL, LDLDIRECT in the last 72 hours. Thyroid Function Tests: No results for input(s): TSH, T4TOTAL, FREET4, T3FREE, THYROIDAB in the last 72 hours. Anemia Panel: No results for input(s): VITAMINB12, FOLATE, FERRITIN, TIBC, IRON, RETICCTPCT in the last 72 hours. Urine analysis:    Component Value Date/Time   COLORURINE YELLOW 09/09/2015 1808   APPEARANCEUR CLEAR 09/09/2015 1808   LABSPEC 1.013 09/09/2015 1808   PHURINE 5.5 09/09/2015 1808   GLUCOSEU NEGATIVE 09/09/2015 1808   HGBUR NEGATIVE 09/09/2015 1808   BILIRUBINUR NEGATIVE 09/09/2015 1808   KETONESUR NEGATIVE 09/09/2015 1808   PROTEINUR NEGATIVE 09/09/2015 1808   UROBILINOGEN 0.2 11/18/2014 0600   NITRITE NEGATIVE 09/09/2015 1808   LEUKOCYTESUR NEGATIVE 09/09/2015 1808   Sepsis Labs: @LABRCNTIP (procalcitonin:4,lacticidven:4) )No results found for this or any previous visit (from the past 240 hour(s)).   Radiological Exams on Admission: Ct Head Wo Contrast  Result Date: 09/09/2015 CLINICAL DATA:  Found down. Left-sided facial and neck bruising and swelling. EXAM: CT HEAD WITHOUT CONTRAST CT MAXILLOFACIAL WITHOUT  CONTRAST CT CERVICAL SPINE WITHOUT CONTRAST TECHNIQUE: Multidetector CT imaging of the head, cervical spine, and maxillofacial  structures were performed using the standard protocol without intravenous contrast. Multiplanar CT image reconstructions of the cervical spine and maxillofacial structures were also generated. COMPARISON:  07/16/2015. FINDINGS: CT HEAD FINDINGS Brain: There is no evidence of acute intracranial hemorrhage, mass lesion, brain edema or extra-axial fluid collection. There is stable mild generalized atrophy with mild prominence of the ventricles and subarachnoid spaces. There is no CT evidence of acute cortical infarction. Vascular: Intracranial vascular calcifications are present. Skull: Negative for fracture or focal lesion. Sinuses/Orbits: The visualized paranasal sinuses and mastoid air cells are clear. Other: None. CT MAXILLOFACIAL FINDINGS No evidence of acute facial fracture. The mandible and temporomandibular joints are intact. There is mild mucosal thickening in the ethmoid sinuses. There is a small polyp or mucous retention cyst anteriorly in the left maxillary sinus. There are no sinus air- fluid levels. There is asymmetric left facial soft tissue swelling extending over the zygoma and into the preseptal components of the left orbit. The globes are intact. The optic nerves and extraocular muscles appear intact. There is asymmetry of the submandibular glands, larger on the left. This appears unchanged from previous study. CT CERVICAL SPINE FINDINGS 5 mm of anterolisthesis is again noted at C3-4 associated with advanced disc space loss and advanced asymmetric facet arthropathy on the left. Appearance is similar to the prior examination. There is no evidence of acute fracture or traumatic subluxation. There is multilevel spondylosis at the other levels which is also unchanged. There is a heterogeneous mass in the left neck, measuring up to 3.0 x 2.7 cm transverse on image 49. This extends approximately 4 cm cephalocaudad. This may reflect hematoma within the left sternocleidomastoid muscle. The rapid change  compared with the relatively recent prior study would argue against a developing mass. There are additional prominent cervical lymph nodes which are otherwise unchanged. The thoracic esophagus is dilated and fluid-filled. Emphysematous changes are present at the lung apices. IMPRESSION: 1. No acute intracranial or calvarial findings. 2. No evidence of acute facial fracture. There is soft tissue injury in the left face. No evidence of orbital hematoma. 3. No evidence of acute cervical spine fracture, traumatic subluxation or static signs of instability. Chronic degenerative anterolisthesis at C3-4 secondary to advanced asymmetric left-sided facet disease appears unchanged. 4. Suspected hematoma involving the left sternocleidomastoid muscle. A mass is difficult to completely exclude based on this examination, although this finding has developed since the prior study of less than 2 months ago. Other left cervical lymph nodes appear unchanged. Clinical follow up recommended. If there is concern of a persistent mass lesion in the neck, follow-up neck CT with contrast may be helpful. Electronically Signed   By: Richardean Sale M.D.   On: 09/09/2015 19:41  Ct Cervical Spine Wo Contrast  Result Date: 09/09/2015 CLINICAL DATA:  Found down. Left-sided facial and neck bruising and swelling. EXAM: CT HEAD WITHOUT CONTRAST CT MAXILLOFACIAL WITHOUT CONTRAST CT CERVICAL SPINE WITHOUT CONTRAST TECHNIQUE: Multidetector CT imaging of the head, cervical spine, and maxillofacial structures were performed using the standard protocol without intravenous contrast. Multiplanar CT image reconstructions of the cervical spine and maxillofacial structures were also generated. COMPARISON:  07/16/2015. FINDINGS: CT HEAD FINDINGS Brain: There is no evidence of acute intracranial hemorrhage, mass lesion, brain edema or extra-axial fluid collection. There is stable mild generalized atrophy with mild prominence of the ventricles and subarachnoid  spaces. There is no CT evidence  of acute cortical infarction. Vascular: Intracranial vascular calcifications are present. Skull: Negative for fracture or focal lesion. Sinuses/Orbits: The visualized paranasal sinuses and mastoid air cells are clear. Other: None. CT MAXILLOFACIAL FINDINGS No evidence of acute facial fracture. The mandible and temporomandibular joints are intact. There is mild mucosal thickening in the ethmoid sinuses. There is a small polyp or mucous retention cyst anteriorly in the left maxillary sinus. There are no sinus air- fluid levels. There is asymmetric left facial soft tissue swelling extending over the zygoma and into the preseptal components of the left orbit. The globes are intact. The optic nerves and extraocular muscles appear intact. There is asymmetry of the submandibular glands, larger on the left. This appears unchanged from previous study. CT CERVICAL SPINE FINDINGS 5 mm of anterolisthesis is again noted at C3-4 associated with advanced disc space loss and advanced asymmetric facet arthropathy on the left. Appearance is similar to the prior examination. There is no evidence of acute fracture or traumatic subluxation. There is multilevel spondylosis at the other levels which is also unchanged. There is a heterogeneous mass in the left neck, measuring up to 3.0 x 2.7 cm transverse on image 49. This extends approximately 4 cm cephalocaudad. This may reflect hematoma within the left sternocleidomastoid muscle. The rapid change compared with the relatively recent prior study would argue against a developing mass. There are additional prominent cervical lymph nodes which are otherwise unchanged. The thoracic esophagus is dilated and fluid-filled. Emphysematous changes are present at the lung apices. IMPRESSION: 1. No acute intracranial or calvarial findings. 2. No evidence of acute facial fracture. There is soft tissue injury in the left face. No evidence of orbital hematoma. 3. No  evidence of acute cervical spine fracture, traumatic subluxation or static signs of instability. Chronic degenerative anterolisthesis at C3-4 secondary to advanced asymmetric left-sided facet disease appears unchanged. 4. Suspected hematoma involving the left sternocleidomastoid muscle. A mass is difficult to completely exclude based on this examination, although this finding has developed since the prior study of less than 2 months ago. Other left cervical lymph nodes appear unchanged. Clinical follow up recommended. If there is concern of a persistent mass lesion in the neck, follow-up neck CT with contrast may be helpful. Electronically Signed   By: Richardean Sale M.D.   On: 09/09/2015 19:41  Dg Chest Port 1 View  Result Date: 09/09/2015 CLINICAL DATA:  Altered mental status.  Fell 3 days ago. EXAM: PORTABLE CHEST 1 VIEW COMPARISON:  07/07/2013 FINDINGS: Stable cardiac enlargement. Stable tortuosity and calcification of the thoracic aorta. Mild central vascular congestion but no pulmonary edema or pleural effusions. The bony thorax is grossly intact. IMPRESSION: Stable cardiac enlargement. Mild central vascular congestion but no edema, effusions or infiltrates. Electronically Signed   By: Marijo Sanes M.D.   On: 09/09/2015 18:30  Ct Maxillofacial Wo Cm  Result Date: 09/09/2015 CLINICAL DATA:  Found down. Left-sided facial and neck bruising and swelling. EXAM: CT HEAD WITHOUT CONTRAST CT MAXILLOFACIAL WITHOUT CONTRAST CT CERVICAL SPINE WITHOUT CONTRAST TECHNIQUE: Multidetector CT imaging of the head, cervical spine, and maxillofacial structures were performed using the standard protocol without intravenous contrast. Multiplanar CT image reconstructions of the cervical spine and maxillofacial structures were also generated. COMPARISON:  07/16/2015. FINDINGS: CT HEAD FINDINGS Brain: There is no evidence of acute intracranial hemorrhage, mass lesion, brain edema or extra-axial fluid collection. There is  stable mild generalized atrophy with mild prominence of the ventricles and subarachnoid spaces. There is no CT evidence of  acute cortical infarction. Vascular: Intracranial vascular calcifications are present. Skull: Negative for fracture or focal lesion. Sinuses/Orbits: The visualized paranasal sinuses and mastoid air cells are clear. Other: None. CT MAXILLOFACIAL FINDINGS No evidence of acute facial fracture. The mandible and temporomandibular joints are intact. There is mild mucosal thickening in the ethmoid sinuses. There is a small polyp or mucous retention cyst anteriorly in the left maxillary sinus. There are no sinus air- fluid levels. There is asymmetric left facial soft tissue swelling extending over the zygoma and into the preseptal components of the left orbit. The globes are intact. The optic nerves and extraocular muscles appear intact. There is asymmetry of the submandibular glands, larger on the left. This appears unchanged from previous study. CT CERVICAL SPINE FINDINGS 5 mm of anterolisthesis is again noted at C3-4 associated with advanced disc space loss and advanced asymmetric facet arthropathy on the left. Appearance is similar to the prior examination. There is no evidence of acute fracture or traumatic subluxation. There is multilevel spondylosis at the other levels which is also unchanged. There is a heterogeneous mass in the left neck, measuring up to 3.0 x 2.7 cm transverse on image 49. This extends approximately 4 cm cephalocaudad. This may reflect hematoma within the left sternocleidomastoid muscle. The rapid change compared with the relatively recent prior study would argue against a developing mass. There are additional prominent cervical lymph nodes which are otherwise unchanged. The thoracic esophagus is dilated and fluid-filled. Emphysematous changes are present at the lung apices. IMPRESSION: 1. No acute intracranial or calvarial findings. 2. No evidence of acute facial fracture.  There is soft tissue injury in the left face. No evidence of orbital hematoma. 3. No evidence of acute cervical spine fracture, traumatic subluxation or static signs of instability. Chronic degenerative anterolisthesis at C3-4 secondary to advanced asymmetric left-sided facet disease appears unchanged. 4. Suspected hematoma involving the left sternocleidomastoid muscle. A mass is difficult to completely exclude based on this examination, although this finding has developed since the prior study of less than 2 months ago. Other left cervical lymph nodes appear unchanged. Clinical follow up recommended. If there is concern of a persistent mass lesion in the neck, follow-up neck CT with contrast may be helpful. Electronically Signed   By: Richardean Sale M.D.   On: 09/09/2015 19:41    EKG: Independently reviewed. Atrial fibrillation, QTC 519, nonspecific T-wave change  Assessment/Plan Principal Problem:   Acute encephalopathy Active Problems:   HLD (hyperlipidemia)   Depression   Essential hypertension   MYOCARDIAL INFARCTION, HX OF   Coronary atherosclerosis   ATRIAL FIBRILLATION, CHRONIC   Abnormality of gait   Fall   Hypoglycemia   Hypothermia   CKD (chronic kidney disease), stage III   Chronic diastolic (congestive) heart failure (HCC)   Acute encephalopathy: Etiology is not clear. Likely due to hypoglycemia. Per EDP, her mental status improved partially to D50 treatment. The underlying mechanism of hypoglycemia and hypothermia is not clear. She does not have signs of infection. Urinalysis negative. Chest x-rays negative for infiltration. She is not diabetic and is not on diabetic medications.  Potential differential diagnosis include acute adrenal insufficiency and severe hypo-thyroidism. Pt was initially started with D5-NS at 75 cc/h, but her CBG can not be maintained.  -will admit to SDU as inpt -start D10 at 75 cc/h -check CBG q1h -prn D50 for hypoglycemia -Solu Cortef 50 mg every  6 hours -Check cortisol level and TSH level -Frequent neuro check  Hypoglycemia: -See above  Addendum: pt is more alert per RN, and reports that she could have mistaken her husband's diabetic medications, but does not remember the name of medication. - check sulfonylurea panel and insulin level.   -Hypothermia: -Warm blanket applied  Hypotension: Patient does not seem to have sepsis or infection. Lactate is normal. Likely due to the same mechanism with hypoglycemia, such as adrenal insufficiency. -IV fluid resuscitation: 1.5 L normal saline -D10 at 75 mL per hour -Normal saline bolus as needed  HTN: -Hold home bp meds due to hypotension  HLD: Last LDL was 55 on 03/12/08 -Hold oral Lipitor due to altered mental status  -Check FLP  Hypothyroidism: Last TSH was 21.5 on 06/08/13 -Continue Synthroid, but switched to IV Synthroid, cut dose to half, from 112 to 62.5 mcg daily by IV -Check TSH  Depression and anxiety:  -hold oral home medications:   CAD: Initial troponin negative. Patient had CP earlier, but no chest pain currently. -switch oral ASA to per rectal  Atrial Fibrillation: CHA2DS2-VASc Score is 5, needs oral anticoagulation, but patient is not on Coumadin, likely due to high risk of fall. Heart rate is well controlled. -switch oral metoprolol to prn IV metoprolol with holding Bp parameter  Hx of Abnormality of gait and fall: this is a chronic issue. No acute bony fracture by CT scan of head, C-spine and maxillofacial. Pt moves all extremities. No new focal neurologic findings, less likely to have stroke -pt/ot  CKD (chronic kidney disease), stage III: stable. Baseline creatinine 1. 6-2.0. Her creatinine is 1.83, BUN 66, indicating stable, but has prerenal component. -IV fluid as above -Follow-up renal function by BMP  Chronic diastolic (congestive) heart failure ([place]): 2-D echo/23/15 showed EF of 55-60%. Patient has 2+ leg edema, but no JVD. Respiratory function seems  to be okay. Does not seem to have acute exacerbation. Due to hypotension, patient needs IV fluid resuscitation. -Hold torsemide -Check BNP -Watch volumes status closely -on ASA and prn metoprolol.  COPD and Asthma: stable. Pt has scattered rhonchi, but no wheezing. O2 saturation 96% on room air. -When necessary albuterol nebulizers   DVT ppx: SCD Code Status: Full code Family Communication: None at bed side.  Disposition Plan:  Anticipate discharge back to previous independent living facility. Consults called:  none Admission status: SDU/inpation       Date of Service 09/09/2015    Ivor Costa [facility] Pager [phone]  If 7PM-7AM, please contact night-coverage [email or web address] Password [facility] 09/09/2015, 9:51 PM

## 2015-09-09 NOTE — ED Triage Notes (Addendum)
Per [facility], Pt is come from [facility]. Pt was found altered in her apartment on the floor facility. Pt was last seen normal yesterday at 1900. When EMS arrived, pt's CBG was 50. Pt was given 12.5 grams of D50. Second CBG was 96. Pt then became alert and oriented x4 upon sugar increasing. EMS noted bruising to the left side of her face, neck, and shoulder new and old due to a fall three days ago that she did not seek treatment for. Pt has baseline of leaning to the left per facility with a chronic neck brace. Vitals per EMS: 114/84, 76 HR, Chills.

## 2015-09-09 NOTE — ED Notes (Signed)
Dr Donna Bernard in to see patient

## 2015-09-09 NOTE — ED Notes (Signed)
Estill Cotta hugger applied  - patient difficulty to arouse.  Woke up after dextrose when repositioned to get rectal temp.

## 2015-09-10 ENCOUNTER — Other Ambulatory Visit: Payer: Self-pay

## 2015-09-10 LAB — GLUCOSE, CAPILLARY
GLUCOSE-CAPILLARY: 104 mg/dL — AB (ref 65–99)
GLUCOSE-CAPILLARY: 107 mg/dL — AB (ref 65–99)
GLUCOSE-CAPILLARY: 112 mg/dL — AB (ref 65–99)
GLUCOSE-CAPILLARY: 159 mg/dL — AB (ref 65–99)
GLUCOSE-CAPILLARY: 173 mg/dL — AB (ref 65–99)
GLUCOSE-CAPILLARY: 196 mg/dL — AB (ref 65–99)
GLUCOSE-CAPILLARY: 58 mg/dL — AB (ref 65–99)
Glucose-Capillary: 114 mg/dL — ABNORMAL HIGH (ref 65–99)
Glucose-Capillary: 115 mg/dL — ABNORMAL HIGH (ref 65–99)
Glucose-Capillary: 137 mg/dL — ABNORMAL HIGH (ref 65–99)
Glucose-Capillary: 164 mg/dL — ABNORMAL HIGH (ref 65–99)
Glucose-Capillary: 98 mg/dL (ref 65–99)

## 2015-09-10 LAB — CBC
HEMATOCRIT: 24.7 % — AB (ref 36.0–46.0)
Hemoglobin: 7.6 g/dL — ABNORMAL LOW (ref 12.0–15.0)
MCH: 31.4 pg (ref 26.0–34.0)
MCHC: 30.8 g/dL (ref 30.0–36.0)
MCV: 102.1 fL — AB (ref 78.0–100.0)
PLATELETS: 103 10*3/uL — AB (ref 150–400)
RBC: 2.42 MIL/uL — ABNORMAL LOW (ref 3.87–5.11)
RDW: 14.9 % (ref 11.5–15.5)
WBC: 3.6 10*3/uL — ABNORMAL LOW (ref 4.0–10.5)

## 2015-09-10 LAB — MRSA PCR SCREENING: MRSA by PCR: NEGATIVE

## 2015-09-10 LAB — RETICULOCYTES
RBC.: 2.79 MIL/uL — ABNORMAL LOW (ref 3.87–5.11)
RETIC CT PCT: 1.7 % (ref 0.4–3.1)
Retic Count, Absolute: 47.4 10*3/uL (ref 19.0–186.0)

## 2015-09-10 LAB — BASIC METABOLIC PANEL
Anion gap: 6 (ref 5–15)
BUN: 60 mg/dL — AB (ref 6–20)
CHLORIDE: 105 mmol/L (ref 101–111)
CO2: 27 mmol/L (ref 22–32)
CREATININE: 1.63 mg/dL — AB (ref 0.44–1.00)
Calcium: 8.7 mg/dL — ABNORMAL LOW (ref 8.9–10.3)
GFR calc Af Amer: 33 mL/min — ABNORMAL LOW (ref >60)
GFR, EST NON AFRICAN AMERICAN: 29 mL/min — AB (ref >60)
GLUCOSE: 98 mg/dL (ref 65–99)
POTASSIUM: 3.7 mmol/L (ref 3.5–5.1)
SODIUM: 138 mmol/L (ref 135–145)

## 2015-09-10 LAB — LIPID PANEL
CHOL/HDL RATIO: 1.8 ratio
Cholesterol: 59 mg/dL (ref 0–200)
HDL: 32 mg/dL — AB (ref >40)
LDL CALC: 23 mg/dL (ref 0–99)
TRIGLYCERIDES: 22 mg/dL (ref <150)
VLDL: 4 mg/dL (ref 0–40)

## 2015-09-10 LAB — VITAMIN B12: Vitamin B-12: 508 pg/mL (ref 180–914)

## 2015-09-10 LAB — FERRITIN: FERRITIN: 124 ng/mL (ref 11–307)

## 2015-09-10 LAB — FOLATE: Folate: 10.1 ng/mL (ref >5.9)

## 2015-09-10 LAB — IRON AND TIBC
IRON: 45 ug/dL (ref 28–170)
SATURATION RATIOS: 15 % (ref 10.4–31.8)
TIBC: 304 ug/dL (ref 250–450)
UIBC: 259 ug/dL

## 2015-09-10 LAB — CORTISOL-AM, BLOOD: Cortisol - AM: 70.8 ug/dL — ABNORMAL HIGH (ref 6.7–22.6)

## 2015-09-10 LAB — TSH: TSH: 1.087 u[IU]/mL (ref 0.350–4.500)

## 2015-09-10 MED ORDER — SENNOSIDES-DOCUSATE SODIUM 8.6-50 MG PO TABS: 2.0000 | ORAL_TABLET | Freq: Every day | ORAL | Status: DC | PRN

## 2015-09-10 MED ORDER — VITAMIN E 180 MG (400 UNIT) PO CAPS
400.0000 [IU] | ORAL_CAPSULE | Freq: Every day | ORAL | Status: DC
  Administered 2015-09-10 – 2015-09-13 (×4): 400 [IU] via ORAL
  Filled 2015-09-10 (×4): qty 1

## 2015-09-10 MED ORDER — ASPIRIN EC 81 MG PO TBEC
81.0000 mg | DELAYED_RELEASE_TABLET | Freq: Every morning | ORAL | Status: DC
  Administered 2015-09-11 – 2015-09-13 (×3): 81 mg via ORAL
  Filled 2015-09-10 (×3): qty 1

## 2015-09-10 MED ORDER — VITAMIN D 1000 UNITS PO TABS
1000.0000 [IU] | ORAL_TABLET | Freq: Every day | ORAL | Status: DC
  Administered 2015-09-10 – 2015-09-13 (×4): 1000 [IU] via ORAL
  Filled 2015-09-10 (×4): qty 1

## 2015-09-10 MED ORDER — SERTRALINE HCL 100 MG PO TABS
100.0000 mg | ORAL_TABLET | Freq: Every day | ORAL | Status: DC
  Administered 2015-09-10 – 2015-09-13 (×4): 100 mg via ORAL
  Filled 2015-09-10 (×4): qty 1

## 2015-09-10 MED ORDER — DEXTROSE-NACL 5-0.9 % IV SOLN: Freq: Once | INTRAVENOUS | Status: DC

## 2015-09-10 MED ORDER — MORPHINE SULFATE 15 MG PO TABS
15.0000 mg | ORAL_TABLET | Freq: Three times a day (TID) | ORAL | Status: DC | PRN
  Administered 2015-09-10 – 2015-09-12 (×3): 15 mg via ORAL
  Filled 2015-09-10 (×3): qty 1

## 2015-09-10 MED ORDER — MORPHINE SULFATE ER 15 MG PO TBCR
30.0000 mg | EXTENDED_RELEASE_TABLET | Freq: Two times a day (BID) | ORAL | Status: DC
  Administered 2015-09-10 – 2015-09-13 (×6): 30 mg via ORAL
  Filled 2015-09-10 (×6): qty 2

## 2015-09-10 MED ORDER — LEVOTHYROXINE SODIUM 112 MCG PO TABS
112.0000 ug | ORAL_TABLET | Freq: Every day | ORAL | Status: DC
  Administered 2015-09-11 – 2015-09-13 (×3): 112 ug via ORAL
  Filled 2015-09-10 (×3): qty 1

## 2015-09-10 MED ORDER — ATORVASTATIN CALCIUM 40 MG PO TABS
40.0000 mg | ORAL_TABLET | Freq: Every day | ORAL | Status: DC
  Administered 2015-09-10 – 2015-09-12 (×3): 40 mg via ORAL
  Filled 2015-09-10 (×3): qty 1

## 2015-09-10 MED ORDER — FERROUS SULFATE 325 (65 FE) MG PO TABS
325.0000 mg | ORAL_TABLET | Freq: Every day | ORAL | Status: DC
  Administered 2015-09-10 – 2015-09-12 (×3): 325 mg via ORAL
  Filled 2015-09-10 (×3): qty 1

## 2015-09-10 NOTE — Progress Notes (Signed)
Pt CBG 48, gave D5 and recheck and it was 112. Will continue to monitor

## 2015-09-10 NOTE — Evaluation (Signed)
Physical Therapy Evaluation Patient Details Name: Victoria Fisher MRN: [id number] DOB: [birth year] Today's Date: 09/10/2015   History of Present Illness  81 y.o. female with history of hypertension, hyperlipidemia, COPD, asthma, hypothyroidism, depression, anxiety, atrial fibrillation not on anticoagulation, CKD-III, dCHF, OSA not on CPAP, TB, frequent falls, colon cancer s/p colon resection, IBS, and chronic neck brace use who presented with AMS and a fall  Clinical Impression  Patient demonstrates deficits in functional mobility as indicated below. Will need continued skilled PT to address deficits and maixmize function. Will see as indicated and progress as tolerated.   OF NOTE: patient with many falls at home, also reports that she believes her admission is related to "taking her husbands medication accidentally" This is concerning regarding patients ability to both care for herself as well as her husband. Highly recommend ST SNF to address balance deficits and safety however, patient not agreeable. Strongly urged patient to consider calling children for supervision and assist. Patient reluctant. May need to consider higher level of care upon discharge as I do note feel that patient demonstrates the awareness and functional ability to remain safe at home or care for another.     Follow Up Recommendations Home health PT;Supervision/Assistance - 24 hour;Supervision for mobility/OOB (Recommend SNF but patient refusing)    Equipment Recommendations  None recommended by PT    Recommendations for Other Services       Precautions / Restrictions Precautions Precautions: Fall Restrictions Weight Bearing Restrictions: No      Mobility  Bed Mobility               General bed mobility comments: recevied in chair  Transfers Overall transfer level: Needs assistance Equipment used: Rolling walker (2 wheeled) Transfers: Sit to/from Stand Sit to Stand: Min assist         General  transfer comment: Min assist for stability, cues for hand placement and safety with elevation to upright  Ambulation/Gait Ambulation/Gait assistance: Min assist Ambulation Distance (Feet): 110 Feet Assistive device: Rolling walker (2 wheeled) Gait Pattern/deviations: Step-through pattern;Decreased stride length;Ataxic;Staggering left;Staggering right;Trunk flexed;Narrow base of support Gait velocity: decreased Gait velocity interpretation: Below normal speed for age/gender General Gait Details: patient with multiple LOB during short distance ambulation, poor ability to control RW safely. manual assist for both patient as well as RW control  Stairs            Wheelchair Mobility    Modified Rankin (Stroke Patients Only)       Balance Overall balance assessment: History of Falls ("more falls than she can count")                                           Pertinent Vitals/Pain Pain Assessment: Faces Faces Pain Scale: Hurts a little bit Pain Location: generalized Pain Intervention(s): Monitored during session    Home Living Family/patient expects to be discharged to:: Private residence (Independent Living) Living Arrangements: Spouse/significant other Available Help at Discharge: Personal care attendant;Available PRN/intermittently (a few hours 3x wk) Type of Home: Independent living facility Home Access: Level entry     Home Layout: One level Home Equipment: Walker - 2 wheels;Cane - single point;Grab bars - toilet;Grab bars - tub/shower      Prior Function Level of Independence: Needs assistance   Gait / Transfers Assistance Needed: uses RW at home but reports "more falls than she can count"  Comments: patient states that she cares for husband who has dementia     Hand Dominance   Dominant Hand: Right    Extremity/Trunk Assessment   Upper Extremity Assessment: Generalized weakness           Lower Extremity Assessment: RLE  deficits/detail;LLE deficits/detail (Poor coordination BLEs)      Cervical / Trunk Assessment:  (cervical contractures noted with side flexion to right)  Communication   Communication: No difficulties  Cognition Arousal/Alertness: Awake/alert Behavior During Therapy: WFL for tasks assessed/performed Overall Cognitive Status: Impaired/Different from baseline Area of Impairment: Safety/judgement;Awareness;Problem solving         Safety/Judgement: Decreased awareness of safety;Decreased awareness of deficits Awareness: Emergent Problem Solving: Requires verbal cues;Requires tactile cues General Comments: patient with very poor insight into safety and deficits. Patient does not think that falling multiple times a day is concerning    General Comments      Exercises        Assessment/Plan    PT Assessment Patient needs continued PT services  PT Diagnosis Difficulty walking;Abnormality of gait   PT Problem List Decreased range of motion;Decreased activity tolerance;Decreased balance;Decreased mobility;Decreased coordination;Decreased cognition;Decreased safety awareness  PT Treatment Interventions DME instruction;Gait training;Stair training;Functional mobility training;Therapeutic activities;Therapeutic exercise;Balance training;Patient/family education   PT Goals (Current goals can be found in the Care Plan section) Acute Rehab PT Goals Patient Stated Goal: to go home to care for her husband PT Goal Formulation: With patient Time For Goal Achievement: 09/24/15 Potential to Achieve Goals: Fair    Frequency Min 3X/week   Barriers to discharge Decreased caregiver support highly recommend looking into ALF vs ILF    Co-evaluation               End of Session Equipment Utilized During Treatment: Gait belt Activity Tolerance: Patient tolerated treatment well Patient left: in chair;with call bell/phone within reach Nurse Communication: Mobility status          Time: [id number] PT Time Calculation (min) (ACUTE ONLY): 19 min   Charges:   PT Evaluation $PT Eval Moderate Complexity: 1 Procedure     PT G CodesDuncan Dull 2015-10-01, 12:30 PM Alben Deeds, [place] DPT  [phone]

## 2015-09-10 NOTE — Progress Notes (Signed)
[place] TEAM 1 - Stepdown/ICU TEAM  Victoria Fisher  [id number] DOB: 12/12/1934 DOA: 09/09/2015 PCP: Haywood Pao, MD    Brief Narrative:  81 y.o. female with history of hypertension, hyperlipidemia, COPD, asthma, hypothyroidism, depression, anxiety, atrial fibrillation not on anticoagulation, CKD-III, dCHF, OSA not on CPAP, TB, frequent falls, colon cancer s/p colon resection, IBS, and chronic neck brace use who presented with AMS and a fall.  The pt lives in an assisted living facility, and was found in the floor by her husband unresponsivene. She was at her normal baseline prior to this. Per EMS her initial CBG was 50.  This improved to 90 after D50, but dropped to 30 on arrival to this ER.   Subjective: The patient is sitting up in a bedside chair.  She is alert oriented and pleasant.  She denies chest pain shortness of breath fevers chills nausea or vomiting.  She believes that she mistakenly took one of her husband's diabetes pills because when she went to find it to give it to him it was nowhere to be found.  Assessment & Plan:  Acute encephalopathy due to medication induced Hypoglycemia -Patient reports accidentally taking her husband's diabetes medication - CBG appears to be stabilizing but we will need to continue to follow another 24 hours off dextrose IV fluid - begin regular diet and follow   Hypothermia -Due to severe hypoglycemia - resolved  Macrocytic anemia -Follow hemoglobin trend - no gross evidence of blood loss - screen for occult GI blood loss - check 0000000 and folic acid levels and iron studies to screen for a mixed etiology   Hypotension -Likely related to severe hypoglycemia - blood pressure remains somewhat borderline but is slowly improving - follow trend and cont to hold home BP meds   Suspected hematoma involving the left sternocleidomastoid muscle -Most consistent with hematoma and consistent with history of multiple falls but given appearance on  CT scan will need to be followed clinically to resolution  Hx of HTN -Hold home bp meds   HLD -Resume usual home medical therapy  Hypothyroidism -Continue home Synthroid dose  Depression and anxiety  CAD -No complaints of chest pain at this time  Atrial Fibrillation -CHA2DS2-VASc Score is 5 - not a candidate for anticoagulation due to frequent recurring falls -heart rate controlled  Hx of Abnormality of gait and fall -a chronic issue - no acute bony fracture by CT scan of head, C-spine and maxillofacial - patient describes falling almost every single day - PT/OT to see  - highly doubt that she is safe to continue living in an independent living situation   CKD stage III -baseline creatinine 1. 6-2.0 - presently stable   Chronic diastolic heart failure -2+ bilateral lower should be edema but no other clinical symptoms to suggest volume overload - baseline weight appears to be as low as 65-68 kilograms - hold diuresis for now given hypotension Filed Weights   09/09/15 2233  Weight: 72.1 kg (158 lb 15.2 oz)    Pulm HTN  COPD and Asthma -when necessary albuterol nebulizers   DVT prophylaxis: SCDs Code Status: FULL CODE Family Communication: no family present at time of exam  Disposition Plan: PT/OT - follow CBGs - ?need for ALF at least   Consultants:  none  Procedures: none  Antimicrobials:  none   Objective: Blood pressure (!) 107/55, pulse 80, temperature 98.3 F (36.8 C), temperature source Oral, resp. rate 15, height 5\' 4"  (1.626 m), weight 72.1 kg (  158 lb 15.2 oz), SpO2 96 %.  Intake/Output Summary (Last 24 hours) at 09/10/15 1045 Last data filed at 09/10/15 0800  Gross per 24 hour  Intake          1879.58 ml  Output              150 ml  Net          1729.58 ml   Filed Weights   09/09/15 2233  Weight: 72.1 kg (158 lb 15.2 oz)    Examination: General: No acute respiratory distress - alert and oriented  Lungs: Clear to auscultation  bilaterally without wheezes or crackles Cardiovascular: Regular rate without murmur gallop or rub normal S1 and S2 Abdomen: Nontender, nondistended, soft, bowel sounds positive, no rebound, no ascites, no appreciable mass Extremities: No significant cyanosis, or clubbing - 2+ edema bilateral lower extremities  CBC:  Recent Labs Lab 09/09/15 1810 09/10/15 0403  WBC 6.6 3.6*  NEUTROABS 5.4  --   HGB 8.5* 7.6*  HCT 28.2* 24.7*  MCV 102.9* 102.1*  PLT 125* [id number]*   Basic Metabolic Panel:  Recent Labs Lab 09/09/15 1810 09/10/15 0403  NA 137 138  K 3.8 3.7  CL 100* 105  CO2 30 27  GLUCOSE 139* 98  BUN 66* 60*  CREATININE 1.83* 1.63*  CALCIUM 8.9 8.7*   GFR: Estimated Creatinine Clearance: 27.3 mL/min (by C-G formula based on SCr of 1.63 mg/dL).  Liver Function Tests:  Recent Labs Lab 09/09/15 1810  AST 23  ALT 10*  ALKPHOS 58  BILITOT 1.1  PROT 6.4*  ALBUMIN 3.4*    Coagulation Profile:  Recent Labs Lab 09/09/15 1821  INR 1.19    Cardiac Enzymes:  Recent Labs Lab 09/09/15 1810 09/09/15 1821  CKTOTAL  --  244*  TROPONINI <0.03  --     HbA1C: Hgb A1c MFr Bld  Date/Time Value Ref Range Status  03/12/2008 04:00 AM  4.6 - 6.1 % Final   6.1 (NOTE)   The ADA recommends the following therapeutic goal for glycemic   control related to Hgb A1C measurement:   Goal of Therapy:   < 7.0% Hgb A1C   Reference: American Diabetes Association: Clinical Practice   Recommendations 2008, Diabetes Care,  2008, 31:(Suppl 1).    CBG:  Recent Labs Lab 09/10/15 0356 09/10/15 0443 09/10/15 0600 09/10/15 0833 09/10/15 0955  GLUCAP 98 104* 107* 114* 159*    Recent Results (from the past 240 hour(s))  MRSA PCR Screening     Status: None   Collection Time: 09/09/15 10:55 PM  Result Value Ref Range Status   MRSA by PCR NEGATIVE NEGATIVE Final    Comment:        The GeneXpert MRSA Assay (FDA approved for NASAL specimens only), is one component of a comprehensive  MRSA colonization surveillance program. It is not intended to diagnose MRSA infection nor to guide or monitor treatment for MRSA infections.      Scheduled Meds: . aspirin  150 mg Rectal Daily  . hydrocortisone sod succinate (SOLU-CORTEF) inj  50 mg Intravenous Q6H  . levothyroxine  62.5 mcg Intravenous Daily  . sodium chloride  500 mL Intravenous Once  . sodium chloride flush  3 mL Intravenous Q12H     LOS: 1 day   Time spent: 35 minutes   Cherene Altes, MD [facility] Office  [phone] Pager - Text Page per Shea Evans as per below:  On-Call/Text Page:      [email or web address]  password [email or web address]  If 7PM-7AM, please contact night-coverage [email or web address] Password [email or web address] 09/10/2015, 10:45 AM

## 2015-09-10 NOTE — Clinical Social Work Note (Signed)
CSW acknowledges consult for "Pt thinks that she took her husband pills by accident, she is not for sure, but she can't remember exactly either. Pt needs help with daily medication." Will notify RNCM.  CSW signing off. Consult again if any other social work needs arise.  Dayton Scrape, [place]

## 2015-09-11 ENCOUNTER — Other Ambulatory Visit (HOSPITAL_COMMUNITY): Payer: Self-pay | Admitting: *Deleted

## 2015-09-11 DIAGNOSIS — I480 Paroxysmal atrial fibrillation: Secondary | ICD-10-CM

## 2015-09-11 DIAGNOSIS — I272 Other secondary pulmonary hypertension: Secondary | ICD-10-CM

## 2015-09-11 DIAGNOSIS — I10 Essential (primary) hypertension: Secondary | ICD-10-CM

## 2015-09-11 DIAGNOSIS — N183 Chronic kidney disease, stage 3 (moderate): Secondary | ICD-10-CM

## 2015-09-11 DIAGNOSIS — G9340 Encephalopathy, unspecified: Secondary | ICD-10-CM

## 2015-09-11 DIAGNOSIS — F329 Major depressive disorder, single episode, unspecified: Secondary | ICD-10-CM

## 2015-09-11 DIAGNOSIS — T68XXXA Hypothermia, initial encounter: Secondary | ICD-10-CM

## 2015-09-11 DIAGNOSIS — I5032 Chronic diastolic (congestive) heart failure: Secondary | ICD-10-CM

## 2015-09-11 LAB — CBC
HEMATOCRIT: 29.1 % — AB (ref 36.0–46.0)
Hemoglobin: 8.8 g/dL — ABNORMAL LOW (ref 12.0–15.0)
MCH: 31.2 pg (ref 26.0–34.0)
MCHC: 30.2 g/dL (ref 30.0–36.0)
MCV: 103.2 fL — AB (ref 78.0–100.0)
Platelets: 131 10*3/uL — ABNORMAL LOW (ref 150–400)
RBC: 2.82 MIL/uL — AB (ref 3.87–5.11)
RDW: 15.1 % (ref 11.5–15.5)
WBC: 6 10*3/uL (ref 4.0–10.5)

## 2015-09-11 LAB — COMPREHENSIVE METABOLIC PANEL
ALT: 11 U/L — AB (ref 14–54)
AST: 20 U/L (ref 15–41)
Albumin: 3.2 g/dL — ABNORMAL LOW (ref 3.5–5.0)
Alkaline Phosphatase: 57 U/L (ref 38–126)
Anion gap: 6 (ref 5–15)
BILIRUBIN TOTAL: 1.1 mg/dL (ref 0.3–1.2)
BUN: 53 mg/dL — AB (ref 6–20)
CALCIUM: 9.3 mg/dL (ref 8.9–10.3)
CO2: 27 mmol/L (ref 22–32)
CREATININE: 1.52 mg/dL — AB (ref 0.44–1.00)
Chloride: 104 mmol/L (ref 101–111)
GFR calc Af Amer: 36 mL/min — ABNORMAL LOW (ref >60)
GFR, EST NON AFRICAN AMERICAN: 31 mL/min — AB (ref >60)
Glucose, Bld: 122 mg/dL — ABNORMAL HIGH (ref 65–99)
Potassium: 3.6 mmol/L (ref 3.5–5.1)
Sodium: 137 mmol/L (ref 135–145)
TOTAL PROTEIN: 6.2 g/dL — AB (ref 6.5–8.1)

## 2015-09-11 LAB — INSULIN, RANDOM: Insulin: 24.7 u[IU]/mL (ref 2.6–24.9)

## 2015-09-11 LAB — GLUCOSE, CAPILLARY
GLUCOSE-CAPILLARY: 109 mg/dL — AB (ref 65–99)
GLUCOSE-CAPILLARY: 113 mg/dL — AB (ref 65–99)
Glucose-Capillary: 97 mg/dL (ref 65–99)
Glucose-Capillary: 125 mg/dL — ABNORMAL HIGH (ref 65–99)
Glucose-Capillary: 129 mg/dL — ABNORMAL HIGH (ref 65–99)

## 2015-09-11 NOTE — Care Management Note (Addendum)
Case Management Note  Patient Details  Name: Victoria Fisher MRN: [id number] Date of Birth: 09-Jun-1934  Subjective/Objective:    Patient lives at home with spouse, she has a rolling walker at home, she also ha an Engineer, production with [name]  2 hrs a week. NCM gave patient list of agency in [facility] and she chose [facility], referral made to [place] with [facility] for [facility] for medication ast  and HHPT.  Soc will begin 24-48hrs post dc.  Patient states she can get transportation home, and she has no problem getting her medications.  She has a PCP. NCM will cont to follow for dc needs.     7/27- NCM spoke with patient about getting extra help at [facility] , she states she will have to speak to her POA about that, she was not sure.  NCM informed her that the CSW will  Be speaking with her about some ALF facilities also today.  Patient lives with spouse and states she can not leave him alone, she needs to be with him, so they really need a place where both of them can stay together.  [place] services is set up but will not be enough, maybe need ALF.              Action/Plan:   Expected Discharge Date:                  Expected Discharge Plan:  [place]  In-House Referral:     Discharge planning Services  CM Consult  Post Acute Care Choice:    Choice offered to:  Patient  DME Arranged:    DME Agency:     HH Arranged:  RN, PT [place] Agency:  [place]  Status of Service:  Completed, signed off  If discussed at [place] of Stay Meetings, dates discussed:    Additional Comments:  Zenon Mayo, RN 09/11/2015, 10:50 AM

## 2015-09-11 NOTE — Care Management Important Message (Signed)
Important Message  Patient Details  Name: Victoria Fisher MRN: [id number] Date of Birth: 1934-06-26   Medicare Important Message Given:  Yes    Loann Quill 09/11/2015, 1:14 PM

## 2015-09-11 NOTE — Progress Notes (Addendum)
[facility] PROGRESS NOTE  Victoria Fisher [id number] DOB: [birth year] DOA: 09/09/2015 PCP: Haywood Pao, MD Admit HPI / Brief Narrative: 81 y.o.WF PMHx Depression, Anxiety, , HLD, COPD, Asthma, OSA not on CPAP, Hypothyroidism, Atrial fibrillation not on anticoagulation, Chronic Diastolic CHF, HTN, CKD-III, TB, Frequent falls, Colon Cancer S/P  colon resection, IBS, and chronic neck brace use   whopresented with AMSand a fall.  The pt lives in an assisted living facility, and was found in the floor by her husband unresponsivene. She was at her normal baseline prior to this. Per EMS her initial CBG was 50.  This improved to 90 after D50, but dropped to 30 onarrival to this ER.   HPI/Subjective: 7/26 A/O 4, admits she has frequent falls. States she and her husband live in an independent living facility. Husband has significant dementia per patient. Freely admits they need to probably move up to a assisted living facility for safety purposes.     Assessment/Plan:  Acute encephalopathy due to medication induced Hypoglycemia -Patient reports accidentally taking her husband's diabetes medication - CBG appears to be stabilizing but we will need to continue to follow another 24 hours off dextrose IV fluid - begin regular diet and follow   Hypothermia -Due to severe hypoglycemia - resolved  Macrocytic anemia -Follow hemoglobin trend - no gross evidence of blood loss - screen for occult GI blood loss - check 0000000 and folic acid levels and iron studies to screen for a mixed etiology   Hypotension -Likely related to severe hypoglycemia - blood pressure remains somewhat borderline but is slowly improving - follow trend and cont to hold home BP meds   Suspected hematoma involving the left sternocleidomastoid muscle -Most consistent with hematoma and consistent with history of multiple falls but given appearance on CT scan will need to be followed clinically to resolution  Hx  of HTN -Hold home bp meds   HLD -Resume usual home medical therapy  Hypothyroidism -Continue home Synthroid dose  Depression and anxiety  CAD -No complaints of chest pain at this time  Atrial Fibrillation -CHA2DS2-VASc Scoreis 5 - not a candidate for anticoagulation due to frequent recurring falls -heart rate controlled  Hx of Abnormality of gait and fall -a chronic issue - no acute bony fracture by CT scan of head, C-spine and maxillofacial - patient describes falling almost every single day - PT/OT to see  - highly doubt that she is safe to continue living in an independent living situation   CKD stage III -baseline creatinine 1. 6-2.0 - presently stable   Chronic diastolic heart failure -2+ bilateral lower should be edema but no other clinical symptoms to suggest volume overload - baseline weight appears to be as low as 65-68 kilograms - hold diuresis for now given hypotension  Pulm HTN  COPD and Asthma -when necessary albuterol nebulizers    Goals of care -7/26 CSW consult patient with frequent falls, living with husband with dementia at independent living facility. Would like to move with husband to assisted living facility for safety.    DVT prophylaxis: SCD Code Status: Full Family Communication: None Disposition Plan: Assisted living facility   Consultants: None  Procedures: None  Cultures None  Antibiotics: None        Objective: Vitals:   09/11/15 0340 09/11/15 0751 09/11/15 1144 09/11/15 1700  BP: 120/79 122/74 118/69 115/72  Pulse: 73 70 71 72  Resp: 15 15 16 10   Temp: 98.4 F (36.9 C) 97.7 F (36.5 C)  98.2 F (36.8 C) 98.6 F (37 C)  TempSrc: Oral Oral Oral Oral  SpO2: 98% 97% 95% 96%  Weight:      Height:        Intake/Output Summary (Last 24 hours) at 09/11/15 1811 Last data filed at 09/11/15 1532  Gross per 24 hour  Intake              363 ml  Output              700 ml  Net             -337 ml   Filed  Weights   09/09/15 2233  Weight: 72.1 kg (158 lb 15.2 oz)     Exam: General: A/O 4, NAD No acute respiratory distress Eyes: Negative headache, double vision,negative scleral hemorrhage ENT: Negative Runny nose, negative gingival bleeding, Neck:  Negative scars, masses, torticollis, lymphadenopathy, JVD Lungs: Clear to auscultation bilaterally without wheezes or crackles Cardiovascular: Irregular irregular rhythm and rate, without murmur gallop or rub normal S1 and S2 Abdomen:negative abdominal pain, negative dysphagia, nondistended, positive soft, bowel sounds, no rebound, no ascites, no appreciable mass Extremities: No significant cyanosis, clubbing, or edema bilateral lower extremities Skin: Large hematoma along left side of face and shoulder: Hematoma along left chest wall and arm Psychiatric:  Negative depression, negative anxiety, negative fatigue, negative mania  Neurologic:  Cranial nerves II through XII intact, tongue/uvula midline, all extremities muscle strength 5/5, sensation intact throughout, negative dysarthria, negative expressive aphasia, negative receptive aphasia.     Data Reviewed: Basic Metabolic Panel:  Recent Labs Lab 09/09/15 1810 09/10/15 0403 09/11/15 0326  NA 137 138 137  K 3.8 3.7 3.6  CL 100* 105 104  CO2 30 27 27   GLUCOSE 139* 98 122*  BUN 66* 60* 53*  CREATININE 1.83* 1.63* 1.52*  CALCIUM 8.9 8.7* 9.3   Liver Function Tests:  Recent Labs Lab 09/09/15 1810 09/11/15 0326  AST 23 20  ALT 10* 11*  ALKPHOS 58 57  BILITOT 1.1 1.1  PROT 6.4* 6.2*  ALBUMIN 3.4* 3.2*   No results for input(s): LIPASE, AMYLASE in the last 168 hours. No results for input(s): AMMONIA in the last 168 hours. CBC:  Recent Labs Lab 09/09/15 1810 09/10/15 0403 09/11/15 0326  WBC 6.6 3.6* 6.0  NEUTROABS 5.4  --   --   HGB 8.5* 7.6* 8.8*  HCT 28.2* 24.7* 29.1*  MCV 102.9* 102.1* 103.2*  PLT 125* 103* 131*   Cardiac Enzymes:  Recent Labs Lab  09/09/15 1810 09/09/15 1821  CKTOTAL  --  244*  TROPONINI <0.03  --    BNP (last 3 results)  Recent Labs  09/09/15 2206  BNP 275.1*    ProBNP (last 3 results) No results for input(s): PROBNP in the last 8760 hours.  CBG:  Recent Labs Lab 09/10/15 2333 09/11/15 0347 09/11/15 0749 09/11/15 1142 09/11/15 1713  GLUCAP 164* 109* 113* 97 125*    Recent Results (from the past 240 hour(s))  MRSA PCR Screening     Status: None   Collection Time: 09/09/15 10:55 PM  Result Value Ref Range Status   MRSA by PCR NEGATIVE NEGATIVE Final    Comment:        The GeneXpert MRSA Assay (FDA approved for NASAL specimens only), is one component of a comprehensive MRSA colonization surveillance program. It is not intended to diagnose MRSA infection nor to guide or monitor treatment for MRSA infections.      Studies:  Ct Head Wo Contrast  Result Date: 09/09/2015 CLINICAL DATA:  Found down. Left-sided facial and neck bruising and swelling. EXAM: CT HEAD WITHOUT CONTRAST CT MAXILLOFACIAL WITHOUT CONTRAST CT CERVICAL SPINE WITHOUT CONTRAST TECHNIQUE: Multidetector CT imaging of the head, cervical spine, and maxillofacial structures were performed using the standard protocol without intravenous contrast. Multiplanar CT image reconstructions of the cervical spine and maxillofacial structures were also generated. COMPARISON:  07/16/2015. FINDINGS: CT HEAD FINDINGS Brain: There is no evidence of acute intracranial hemorrhage, mass lesion, brain edema or extra-axial fluid collection. There is stable mild generalized atrophy with mild prominence of the ventricles and subarachnoid spaces. There is no CT evidence of acute cortical infarction. Vascular: Intracranial vascular calcifications are present. Skull: Negative for fracture or focal lesion. Sinuses/Orbits: The visualized paranasal sinuses and mastoid air cells are clear. Other: None. CT MAXILLOFACIAL FINDINGS No evidence of acute facial fracture.  The mandible and temporomandibular joints are intact. There is mild mucosal thickening in the ethmoid sinuses. There is a small polyp or mucous retention cyst anteriorly in the left maxillary sinus. There are no sinus air- fluid levels. There is asymmetric left facial soft tissue swelling extending over the zygoma and into the preseptal components of the left orbit. The globes are intact. The optic nerves and extraocular muscles appear intact. There is asymmetry of the submandibular glands, larger on the left. This appears unchanged from previous study. CT CERVICAL SPINE FINDINGS 5 mm of anterolisthesis is again noted at C3-4 associated with advanced disc space loss and advanced asymmetric facet arthropathy on the left. Appearance is similar to the prior examination. There is no evidence of acute fracture or traumatic subluxation. There is multilevel spondylosis at the other levels which is also unchanged. There is a heterogeneous mass in the left neck, measuring up to 3.0 x 2.7 cm transverse on image 49. This extends approximately 4 cm cephalocaudad. This may reflect hematoma within the left sternocleidomastoid muscle. The rapid change compared with the relatively recent prior study would argue against a developing mass. There are additional prominent cervical lymph nodes which are otherwise unchanged. The thoracic esophagus is dilated and fluid-filled. Emphysematous changes are present at the lung apices. IMPRESSION: 1. No acute intracranial or calvarial findings. 2. No evidence of acute facial fracture. There is soft tissue injury in the left face. No evidence of orbital hematoma. 3. No evidence of acute cervical spine fracture, traumatic subluxation or static signs of instability. Chronic degenerative anterolisthesis at C3-4 secondary to advanced asymmetric left-sided facet disease appears unchanged. 4. Suspected hematoma involving the left sternocleidomastoid muscle. A mass is difficult to completely exclude  based on this examination, although this finding has developed since the prior study of less than 2 months ago. Other left cervical lymph nodes appear unchanged. Clinical follow up recommended. If there is concern of a persistent mass lesion in the neck, follow-up neck CT with contrast may be helpful. Electronically Signed   By: Richardean Sale M.D.   On: 09/09/2015 19:41  Ct Cervical Spine Wo Contrast  Result Date: 09/09/2015 CLINICAL DATA:  Found down. Left-sided facial and neck bruising and swelling. EXAM: CT HEAD WITHOUT CONTRAST CT MAXILLOFACIAL WITHOUT CONTRAST CT CERVICAL SPINE WITHOUT CONTRAST TECHNIQUE: Multidetector CT imaging of the head, cervical spine, and maxillofacial structures were performed using the standard protocol without intravenous contrast. Multiplanar CT image reconstructions of the cervical spine and maxillofacial structures were also generated. COMPARISON:  07/16/2015. FINDINGS: CT HEAD FINDINGS Brain: There is no evidence of acute intracranial hemorrhage, mass  lesion, brain edema or extra-axial fluid collection. There is stable mild generalized atrophy with mild prominence of the ventricles and subarachnoid spaces. There is no CT evidence of acute cortical infarction. Vascular: Intracranial vascular calcifications are present. Skull: Negative for fracture or focal lesion. Sinuses/Orbits: The visualized paranasal sinuses and mastoid air cells are clear. Other: None. CT MAXILLOFACIAL FINDINGS No evidence of acute facial fracture. The mandible and temporomandibular joints are intact. There is mild mucosal thickening in the ethmoid sinuses. There is a small polyp or mucous retention cyst anteriorly in the left maxillary sinus. There are no sinus air- fluid levels. There is asymmetric left facial soft tissue swelling extending over the zygoma and into the preseptal components of the left orbit. The globes are intact. The optic nerves and extraocular muscles appear intact. There is  asymmetry of the submandibular glands, larger on the left. This appears unchanged from previous study. CT CERVICAL SPINE FINDINGS 5 mm of anterolisthesis is again noted at C3-4 associated with advanced disc space loss and advanced asymmetric facet arthropathy on the left. Appearance is similar to the prior examination. There is no evidence of acute fracture or traumatic subluxation. There is multilevel spondylosis at the other levels which is also unchanged. There is a heterogeneous mass in the left neck, measuring up to 3.0 x 2.7 cm transverse on image 49. This extends approximately 4 cm cephalocaudad. This may reflect hematoma within the left sternocleidomastoid muscle. The rapid change compared with the relatively recent prior study would argue against a developing mass. There are additional prominent cervical lymph nodes which are otherwise unchanged. The thoracic esophagus is dilated and fluid-filled. Emphysematous changes are present at the lung apices. IMPRESSION: 1. No acute intracranial or calvarial findings. 2. No evidence of acute facial fracture. There is soft tissue injury in the left face. No evidence of orbital hematoma. 3. No evidence of acute cervical spine fracture, traumatic subluxation or static signs of instability. Chronic degenerative anterolisthesis at C3-4 secondary to advanced asymmetric left-sided facet disease appears unchanged. 4. Suspected hematoma involving the left sternocleidomastoid muscle. A mass is difficult to completely exclude based on this examination, although this finding has developed since the prior study of less than 2 months ago. Other left cervical lymph nodes appear unchanged. Clinical follow up recommended. If there is concern of a persistent mass lesion in the neck, follow-up neck CT with contrast may be helpful. Electronically Signed   By: Richardean Sale M.D.   On: 09/09/2015 19:41  Dg Chest Port 1 View  Result Date: 09/09/2015 CLINICAL DATA:  Altered mental  status.  Fell 3 days ago. EXAM: PORTABLE CHEST 1 VIEW COMPARISON:  07/07/2013 FINDINGS: Stable cardiac enlargement. Stable tortuosity and calcification of the thoracic aorta. Mild central vascular congestion but no pulmonary edema or pleural effusions. The bony thorax is grossly intact. IMPRESSION: Stable cardiac enlargement. Mild central vascular congestion but no edema, effusions or infiltrates. Electronically Signed   By: Marijo Sanes M.D.   On: 09/09/2015 18:30  Ct Maxillofacial Wo Cm  Result Date: 09/09/2015 CLINICAL DATA:  Found down. Left-sided facial and neck bruising and swelling. EXAM: CT HEAD WITHOUT CONTRAST CT MAXILLOFACIAL WITHOUT CONTRAST CT CERVICAL SPINE WITHOUT CONTRAST TECHNIQUE: Multidetector CT imaging of the head, cervical spine, and maxillofacial structures were performed using the standard protocol without intravenous contrast. Multiplanar CT image reconstructions of the cervical spine and maxillofacial structures were also generated. COMPARISON:  07/16/2015. FINDINGS: CT HEAD FINDINGS Brain: There is no evidence of acute intracranial hemorrhage, mass lesion,  brain edema or extra-axial fluid collection. There is stable mild generalized atrophy with mild prominence of the ventricles and subarachnoid spaces. There is no CT evidence of acute cortical infarction. Vascular: Intracranial vascular calcifications are present. Skull: Negative for fracture or focal lesion. Sinuses/Orbits: The visualized paranasal sinuses and mastoid air cells are clear. Other: None. CT MAXILLOFACIAL FINDINGS No evidence of acute facial fracture. The mandible and temporomandibular joints are intact. There is mild mucosal thickening in the ethmoid sinuses. There is a small polyp or mucous retention cyst anteriorly in the left maxillary sinus. There are no sinus air- fluid levels. There is asymmetric left facial soft tissue swelling extending over the zygoma and into the preseptal components of the left orbit. The  globes are intact. The optic nerves and extraocular muscles appear intact. There is asymmetry of the submandibular glands, larger on the left. This appears unchanged from previous study. CT CERVICAL SPINE FINDINGS 5 mm of anterolisthesis is again noted at C3-4 associated with advanced disc space loss and advanced asymmetric facet arthropathy on the left. Appearance is similar to the prior examination. There is no evidence of acute fracture or traumatic subluxation. There is multilevel spondylosis at the other levels which is also unchanged. There is a heterogeneous mass in the left neck, measuring up to 3.0 x 2.7 cm transverse on image 49. This extends approximately 4 cm cephalocaudad. This may reflect hematoma within the left sternocleidomastoid muscle. The rapid change compared with the relatively recent prior study would argue against a developing mass. There are additional prominent cervical lymph nodes which are otherwise unchanged. The thoracic esophagus is dilated and fluid-filled. Emphysematous changes are present at the lung apices. IMPRESSION: 1. No acute intracranial or calvarial findings. 2. No evidence of acute facial fracture. There is soft tissue injury in the left face. No evidence of orbital hematoma. 3. No evidence of acute cervical spine fracture, traumatic subluxation or static signs of instability. Chronic degenerative anterolisthesis at C3-4 secondary to advanced asymmetric left-sided facet disease appears unchanged. 4. Suspected hematoma involving the left sternocleidomastoid muscle. A mass is difficult to completely exclude based on this examination, although this finding has developed since the prior study of less than 2 months ago. Other left cervical lymph nodes appear unchanged. Clinical follow up recommended. If there is concern of a persistent mass lesion in the neck, follow-up neck CT with contrast may be helpful. Electronically Signed   By: Richardean Sale M.D.   On: 09/09/2015  19:41   Scheduled Meds: . aspirin EC  81 mg Oral q morning - 10a  . atorvastatin  40 mg Oral q1800  . cholecalciferol  1,000 Units Oral Daily  . ferrous sulfate  325 mg Oral Q supper  . levothyroxine  112 mcg Oral QAC breakfast  . morphine  30 mg Oral BID  . sertraline  100 mg Oral Daily  . sodium chloride flush  3 mL Intravenous Q12H  . vitamin E  400 Units Oral Daily   Continuous Infusions:   Principal Problem:   Acute encephalopathy Active Problems:   HLD (hyperlipidemia)   Depression   Essential hypertension   MYOCARDIAL INFARCTION, HX OF   Coronary atherosclerosis   ATRIAL FIBRILLATION, CHRONIC   Abnormality of gait   Fall   Hypoglycemia   Hypothermia   CKD (chronic kidney disease), stage III   Chronic diastolic (congestive) heart failure (HCC)    Time spent: 40 minutes    [facility], [facility] Pager [phone]. If 7PM-7AM,  please contact night-coverage at [email or web address], password [facility] 09/11/2015, 6:11 PM  LOS: 2 days    Care during the described time interval was provided by me .  I have reviewed this patient's available data, including medical history, events of note, physical examination, and all test results as part of my evaluation. I have personally reviewed and interpreted all radiology studies.   Dia Crawford, MD [phone] Pager

## 2015-09-11 NOTE — Progress Notes (Signed)
Physical Therapy Treatment/Vestibular Assessment Patient Details Name: Victoria Fisher MRN: [id number] DOB: [birth year] Today's Date: 09/11/2015    History of Present Illness 81 y.o. female with history of hypertension, hyperlipidemia, COPD, asthma, hypothyroidism, depression, anxiety, atrial fibrillation not on anticoagulation, CKD-III, dCHF, OSA not on CPAP, TB, frequent falls, colon cancer s/p colon resection, IBS, and chronic neck brace use who presented with AMS and a fall.  Pt dx with acute encepholopathy due to medication induced hypoglycemia, hypothermia, macrocytic anemia, and hypotension.      PT Comments    Pt with (+) R horizontal canal BPPV.  None of the canalith repositioning maneuvers are going to be easy given her neck ROM, so I preformed what I could of a modified BBQ roll.  PT will re-test tomorrow to see if there is any positive results of the attempted maneuver.  I would like to see if the [facility] agency has a vestibular person who could see her at home and if not, after completing her [place] therapy, she would like to return for f/u balance and vestibular treatment at the outpatient neurorehab center where she has been for vestibular treatment in the past.  She and her husband would likely be safer in SNF (her for rehab, him for supervision given his Alzheimer's), but pt is refusing.    Follow Up Recommendations  Home health PT;Supervision/Assistance - 24 hour;Other (comment) (see if HHPT has a vestibular person who can see her.)     Equipment Recommendations  None recommended by PT (agree she would be safer at SNF, she is 1 caregiver for husb)    Recommendations for Other Services       Precautions / Restrictions Precautions Precautions: Fall Precaution Comments: significant PMHx of frequent falls with head trauma Restrictions Weight Bearing Restrictions: No    Mobility  Bed Mobility Overal bed mobility: Needs Assistance Bed Mobility: Rolling;Sidelying to Sit;Sit to  Supine Rolling: Min assist Sidelying to sit: Min assist Supine to sit: Supervision     General bed mobility comments: Min assist to help pt get into testing and treatment positions.   Transfers Overall transfer level: Needs assistance Equipment used: Rolling walker (2 wheeled) Transfers: Sit to/from Stand Sit to Stand: Min assist         General transfer comment: VCs for hand placement upon coming to standing after rest break from chair, VCs for controlled descent to chair  Ambulation/Gait Ambulation/Gait assistance: Min assist Ambulation Distance (Feet): 160 Feet Assistive device: Rolling walker (2 wheeled) Gait Pattern/deviations: Step-through pattern;Decreased stride length;Ataxic;Staggering left;Staggering right;Trunk flexed;Narrow base of support Gait velocity: decreased Gait velocity interpretation: Below normal speed for age/gender General Gait Details: Required 2 standing rest breaks and 1 seated break due to dizziness. Patient with some improvements in stability but continues to demonstrate poor ability to control walker safely during ambulation.          Balance Overall balance assessment: Needs assistance Sitting-balance support: Feet supported;Bilateral upper extremity supported Sitting balance-Leahy Scale: Fair     Standing balance support: No upper extremity supported;During functional activity Standing balance-Leahy Scale: Fair Standing balance comment: Pt able to stand at sink and complete grooming activities with light min assist at times for balance.                    Cognition Arousal/Alertness: Awake/alert Behavior During Therapy: WFL for tasks assessed/performed Overall Cognitive Status: Impaired/Different from baseline Area of Impairment: Following commands;Safety/judgement;Awareness;Problem solving     Memory: Decreased short-term memory Following Commands: Follows  multi-step commands consistently Safety/Judgement: Decreased  awareness of safety;Decreased awareness of deficits Awareness: Emergent Problem Solving: Requires verbal cues General Comments: reported that she may have taken her husband's medication by accident which may have caused the hypoglycemia as he is a diabetic and she is not.            Pertinent Vitals/Pain Pain Assessment: No/denies pain Faces Pain Scale: No hurt    Home Living Family/patient expects to be discharged to:: Private residence Living Arrangements: Spouse/significant other Available Help at Discharge: Personal care attendant;Available PRN/intermittently (2x per week for a few hours) Type of Home: Independent living facility Home Access: Level entry   Home Layout: One level Home Equipment: Walker - 2 wheels;Cane - single point;Grab bars - toilet;Grab bars - tub/shower;Shower seat      Prior Function Level of Independence: Needs assistance  Gait / Transfers Assistance Needed: uses RW at home but reports "more falls than she can count" ADL's / Homemaking Assistance Needed: pt reports she is independent with BADLs. Facility does cooking Comments: patient states that she cares for husband who has dementia   PT Goals (current goals can now be found in the care plan section) Acute Rehab PT Goals Patient Stated Goal: to go home so she can make sure her husband is ok.  PT Goal Formulation: With patient Time For Goal Achievement: 09/24/15 Potential to Achieve Goals: Fair Progress towards PT goals: Progressing toward goals    Frequency  Min 3X/week    PT Plan Current plan remains appropriate       End of Session Equipment Utilized During Treatment: Gait belt Activity Tolerance: Other (comment) (limited by dizziness) Patient left: in bed;with call bell/phone within reach;with bed alarm set     Time: 1431-1456 PT Time Calculation (min) (ACUTE ONLY): 25 min  Charges:   $Therapeutic Activity: 8-22 mins $Canalith Rep Proc: 8-22 mins                      Lealand Elting B.  [place], [place], DPT [phone]   09/11/2015, 3:11 PM

## 2015-09-11 NOTE — Evaluation (Signed)
Occupational Therapy Evaluation Patient Details Name: Victoria Fisher MRN: [id number] DOB: 1934/08/07 Today's Date: 09/11/2015    History of Present Illness 81 y.o. female with history of hypertension, hyperlipidemia, COPD, asthma, hypothyroidism, depression, anxiety, atrial fibrillation not on anticoagulation, CKD-III, dCHF, OSA not on CPAP, TB, frequent falls, colon cancer s/p colon resection, IBS, and chronic neck brace use who presented with AMS and a fall   Clinical Impression   Pt reports she was managing ADL independently PTA. Currently pt requires min assist overall for ADL and functional mobility. Pt reports hx of falls at home and had one LOB during functional mobility requiring min assist to correct; pt does not seem to recognize balance/hx of falls as a safety concern for return home. Pt c/o dizziness (room spinning) with supine to sit and noted to have horizontal nystagmus; resolved within ~1 minute (RN notified and ordering PT vestibular eval). Pt refusing SNF, therefore, recommending HHOT with 24/7 supervision for follow up in order to maximize pts independence and safety with ADL and functional mobility upon return home. Pt would benefit from continued skilled OT to address established goals.    Follow Up Recommendations  Home health OT;Supervision/Assistance - 24 hour    Equipment Recommendations  None recommended by OT    Recommendations for Other Services       Precautions / Restrictions Precautions Precautions: Fall Restrictions Weight Bearing Restrictions: No      Mobility Bed Mobility Overal bed mobility: Needs Assistance Bed Mobility: Supine to Sit     Supine to sit: Min guard     General bed mobility comments: Min guard for safety. VCs for scooting hips out to EOB. Pt required no physical assist.  Transfers Overall transfer level: Needs assistance Equipment used: Rolling walker (2 wheeled) Transfers: Sit to/from Stand Sit to Stand: Min assist          General transfer comment: Pt able to boost up from EOB without physical assist but required min assist for balance in standing. VCs for hand placement.    Balance Overall balance assessment: Needs assistance;History of Falls Sitting-balance support: Feet supported;Bilateral upper extremity supported Sitting balance-Leahy Scale: Fair     Standing balance support: No upper extremity supported;During functional activity Standing balance-Leahy Scale: Fair Standing balance comment: Pt able to stand at sink and complete grooming activities with light min assist at times for balance.                            ADL Overall ADL's : Needs assistance/impaired Eating/Feeding: Set up;Sitting   Grooming: Minimal assistance;Oral care;Standing Grooming Details (indicate cue type and reason): Min assist for balance in standing Upper Body Bathing: Set up;Supervision/ safety;Sitting   Lower Body Bathing: Minimal assistance;Sit to/from stand   Upper Body Dressing : Set up;Supervision/safety;Sitting   Lower Body Dressing: Minimal assistance;Sit to/from stand Lower Body Dressing Details (indicate cue type and reason): Pt able to pull up socks sitting EOB. Anticipate pt would require min assist in standing for balance. Toilet Transfer: Minimal assistance;Ambulation;BSC;RW Toilet Transfer Details (indicate cue type and reason): Simulated by sit to stand from EOB and mobility in room. Toileting- Clothing Manipulation and Hygiene: Minimal assistance;Sit to/from stand       Functional mobility during ADLs: Minimal assistance;Rolling walker General ADL Comments: Pt with LOB x1 with mobility in room to sink; min assist provided to correct balance. Pt able to identify that she is unsteady but does not seem to think it  is a safety risk. Pt reports multiple falls at home. Upon coming from supine to sit pt with reported dizziness (room spinning per pt) and noted to have horizontal nystagmus;  resolved within 1 min; RN aware and ordering PT vestibular eval.     Vision Additional Comments: Appears WFL. Horizontal nystagmus noted with positional changes; resolves within ~1 minute.   Perception     Praxis      Pertinent Vitals/Pain Pain Assessment: Faces Faces Pain Scale: No hurt     Hand Dominance Right   Extremity/Trunk Assessment Upper Extremity Assessment Upper Extremity Assessment: Generalized weakness   Lower Extremity Assessment Lower Extremity Assessment: Defer to PT evaluation   Cervical / Trunk Assessment Cervical / Trunk Assessment: Other exceptions Cervical / Trunk Exceptions: cervical contractures noted. Pt with cervical collar donned upon arrival.    Communication Communication Communication: No difficulties   Cognition Arousal/Alertness: Awake/alert Behavior During Therapy: WFL for tasks assessed/performed Overall Cognitive Status: Impaired/Different from baseline Area of Impairment: Following commands;Safety/judgement;Awareness;Problem solving       Following Commands: Follows multi-step commands consistently Safety/Judgement: Decreased awareness of safety;Decreased awareness of deficits Awareness: Emergent Problem Solving: Requires verbal cues     General Comments       Exercises       Shoulder Instructions      Home Living Family/patient expects to be discharged to:: Private residence Living Arrangements: Spouse/significant other Available Help at Discharge: Personal care attendant;Available PRN/intermittently (2x per week for a few hours) Type of Home: Independent living facility Home Access: Level entry     Home Layout: One level     Bathroom Shower/Tub: Walk-in shower (pt reports she uses husbands walk in shower)   Biochemist, clinical: Standard Bathroom Accessibility: Yes How Accessible: Accessible via walker Home Equipment: [place] - 2 wheels;Cane - single point;Grab bars - toilet;Grab bars - tub/shower;Shower seat           Prior Functioning/Environment Level of Independence: Needs assistance  Gait / Transfers Assistance Needed: uses RW at home but reports "more falls than she can count" ADL's / Homemaking Assistance Needed: pt reports she is independent with BADLs. Facility does cooking   Comments: patient states that she cares for husband who has dementia    OT Diagnosis: Generalized weakness;Cognitive deficits   OT Problem List: Decreased strength;Decreased range of motion;Decreased activity tolerance;Impaired balance (sitting and/or standing);Decreased coordination;Decreased cognition;Decreased safety awareness;Decreased knowledge of use of DME or AE;Decreased knowledge of precautions   OT Treatment/Interventions: Self-care/ADL training;Energy conservation;DME and/or AE instruction;Therapeutic activities;Patient/family education;Balance training;Cognitive remediation/compensation    OT Goals(Current goals can be found in the care plan section) Acute Rehab OT Goals Patient Stated Goal: to go home OT Goal Formulation: With patient Time For Goal Achievement: 09/25/15 Potential to Achieve Goals: Good ADL Goals Pt Will Perform Grooming: with modified independence;standing Pt Will Perform Upper Body Bathing: with modified independence;sitting Pt Will Perform Lower Body Bathing: with modified independence;sit to/from stand Pt Will Transfer to Toilet: with modified independence;ambulating;regular height toilet Pt Will Perform Toileting - Clothing Manipulation and hygiene: with modified independence;sit to/from stand Pt Will Perform Tub/Shower Transfer: Shower transfer;with modified independence;shower seat;rolling walker  OT Frequency: Min 2X/week   Barriers to D/C: Decreased caregiver support  pt cares for husband who has dementia       Co-evaluation              End of Session Equipment Utilized During Treatment: Gait belt;Rolling walker;Cervical collar Nurse Communication: Mobility  status;Other (comment) (pt with nystagmus-need PT vestibular eval)  Activity Tolerance: Patient tolerated treatment well Patient left: Other (comment) (with PT)   Time: [id number] OT Time Calculation (min): 21 min Charges:  OT General Charges $OT Visit: 1 Procedure OT Evaluation $OT Eval Moderate Complexity: 1 Procedure G-Codes:     Binnie Kand M.S., OTR/L Pager: [phone]  09/11/2015, 12:12 PM

## 2015-09-11 NOTE — Progress Notes (Signed)
Physical Therapy Treatment Patient Details Name: Victoria Fisher MRN: [id number] DOB: 16-Mar-1934 Today's Date: 09/11/2015    History of Present Illness 81 y.o. female with history of hypertension, hyperlipidemia, COPD, asthma, hypothyroidism, depression, anxiety, atrial fibrillation not on anticoagulation, CKD-III, dCHF, OSA not on CPAP, TB, frequent falls, colon cancer s/p colon resection, IBS, and chronic neck brace use who presented with AMS and a fall    PT Comments    Patient seen for mobility progression. Patient continues to endorse "vertigo" like dizziness and instability with ambulation. Recommend vestibular assessment. Will continue to follow as indicated.  Follow Up Recommendations  Home health PT;Supervision/Assistance - 24 hour;Supervision for mobility/OOB (Recommend SNF but patient refusing)     Equipment Recommendations  None recommended by PT    Recommendations for Other Services       Precautions / Restrictions Precautions Precautions: Fall Restrictions Weight Bearing Restrictions: No    Mobility  Bed Mobility Overal bed mobility: Needs Assistance Bed Mobility: Supine to Sit     Supine to sit: Min guard     General bed mobility comments: received up at sink from OT  Transfers Overall transfer level: Needs assistance Equipment used: Rolling walker (2 wheeled) Transfers: Sit to/from Stand Sit to Stand: Min assist         General transfer comment: VCs for hand placement upon coming to standing after rest break from chair, VCs for controlled descent to chair  Ambulation/Gait Ambulation/Gait assistance: Min assist Ambulation Distance (Feet): 160 Feet Assistive device: Rolling walker (2 wheeled) Gait Pattern/deviations: Step-through pattern;Decreased stride length;Ataxic;Staggering left;Staggering right;Trunk flexed;Narrow base of support Gait velocity: decreased Gait velocity interpretation: Below normal speed for age/gender General Gait Details:  Required 2 standing rest breaks and 1 seated break due to dizziness. Patient with some improvements in stability but continues to demonstrate poor ability to control walker safely during ambulation.   Stairs            Wheelchair Mobility    Modified Rankin (Stroke Patients Only)       Balance Overall balance assessment: Needs assistance;History of Falls Sitting-balance support: Feet supported;Bilateral upper extremity supported Sitting balance-Leahy Scale: Fair     Standing balance support: No upper extremity supported;During functional activity Standing balance-Leahy Scale: Fair Standing balance comment: Pt able to stand at sink and complete grooming activities with light min assist at times for balance.                    Cognition Arousal/Alertness: Awake/alert Behavior During Therapy: WFL for tasks assessed/performed Overall Cognitive Status: Impaired/Different from baseline Area of Impairment: Following commands;Safety/judgement;Awareness;Problem solving       Following Commands: Follows multi-step commands consistently Safety/Judgement: Decreased awareness of safety;Decreased awareness of deficits Awareness: Emergent Problem Solving: Requires verbal cues      Exercises      General Comments        Pertinent Vitals/Pain Pain Assessment: Faces Faces Pain Scale: No hurt    Home Living Family/patient expects to be discharged to:: Private residence Living Arrangements: Spouse/significant other Available Help at Discharge: Personal care attendant;Available PRN/intermittently (2x per week for a few hours) Type of Home: Independent living facility Home Access: Level entry   Home Layout: One level Home Equipment: Walker - 2 wheels;Cane - single point;Grab bars - toilet;Grab bars - tub/shower;Shower seat      Prior Function Level of Independence: Needs assistance  Gait / Transfers Assistance Needed: uses RW at home but reports "more falls than she  can count"  ADL's / Homemaking Assistance Needed: pt reports she is independent with BADLs. Facility does cooking Comments: patient states that she cares for husband who has dementia   PT Goals (current goals can now be found in the care plan section) Acute Rehab PT Goals Patient Stated Goal: to go home PT Goal Formulation: With patient Time For Goal Achievement: 09/24/15 Potential to Achieve Goals: Fair Progress towards PT goals: Progressing toward goals    Frequency  Min 3X/week    PT Plan Current plan remains appropriate    Co-evaluation             End of Session Equipment Utilized During Treatment: Gait belt Activity Tolerance: Patient tolerated treatment well Patient left: in chair;with call bell/phone within reach     Time: 1019-1036 PT Time Calculation (min) (ACUTE ONLY): 17 min  Charges:  $Gait Training: 8-22 mins                    G CodesDuncan Dull 10/02/2015, 12:28 PM Alben Deeds, [place] DPT  [phone]

## 2015-09-12 ENCOUNTER — Ambulatory Visit (HOSPITAL_COMMUNITY): Payer: Medicare Other

## 2015-09-12 DIAGNOSIS — I82403 Acute embolism and thrombosis of unspecified deep veins of lower extremity, bilateral: Secondary | ICD-10-CM

## 2015-09-12 DIAGNOSIS — I9589 Other hypotension: Secondary | ICD-10-CM

## 2015-09-12 DIAGNOSIS — J410 Simple chronic bronchitis: Secondary | ICD-10-CM

## 2015-09-12 DIAGNOSIS — T40601A Poisoning by unspecified narcotics, accidental (unintentional), initial encounter: Secondary | ICD-10-CM

## 2015-09-12 LAB — GLUCOSE, CAPILLARY
GLUCOSE-CAPILLARY: 104 mg/dL — AB (ref 65–99)
GLUCOSE-CAPILLARY: 121 mg/dL — AB (ref 65–99)
GLUCOSE-CAPILLARY: 130 mg/dL — AB (ref 65–99)
GLUCOSE-CAPILLARY: 96 mg/dL (ref 65–99)
Glucose-Capillary: 171 mg/dL — ABNORMAL HIGH (ref 65–99)
Glucose-Capillary: 238 mg/dL — ABNORMAL HIGH (ref 65–99)

## 2015-09-12 LAB — BASIC METABOLIC PANEL
Anion gap: 8 (ref 5–15)
BUN: 40 mg/dL — AB (ref 6–20)
CALCIUM: 9.4 mg/dL (ref 8.9–10.3)
CO2: 28 mmol/L (ref 22–32)
CREATININE: 1.38 mg/dL — AB (ref 0.44–1.00)
Chloride: 105 mmol/L (ref 101–111)
GFR calc Af Amer: 41 mL/min — ABNORMAL LOW (ref >60)
GFR calc non Af Amer: 35 mL/min — ABNORMAL LOW (ref >60)
GLUCOSE: 115 mg/dL — AB (ref 65–99)
POTASSIUM: 3.6 mmol/L (ref 3.5–5.1)
SODIUM: 141 mmol/L (ref 135–145)

## 2015-09-12 LAB — MAGNESIUM: Magnesium: 2.4 mg/dL (ref 1.7–2.4)

## 2015-09-12 MED ORDER — FUROSEMIDE 10 MG/ML IJ SOLN
60.0000 mg | Freq: Once | INTRAMUSCULAR | Status: AC
  Administered 2015-09-12: 60 mg via INTRAVENOUS
  Filled 2015-09-12: qty 6

## 2015-09-12 MED ORDER — HYDROMORPHONE HCL 1 MG/ML IJ SOLN
0.5000 mg | Freq: Once | INTRAMUSCULAR | Status: AC
Start: 2015-09-12 — End: 2015-09-12
  Administered 2015-09-12: 0.5 mg via INTRAVENOUS
  Filled 2015-09-12: qty 1

## 2015-09-12 MED ORDER — GABAPENTIN 600 MG PO TABS
300.0000 mg | ORAL_TABLET | Freq: Every day | ORAL | Status: DC
  Administered 2015-09-12: 300 mg via ORAL
  Filled 2015-09-12: qty 1

## 2015-09-12 NOTE — Progress Notes (Signed)
*  Preliminary Results* Bilateral lower extremity venous duplex completed. Visualized veins of bilateral lower extremities are negative for deep vein thrombosis. There is no evidence of Baker's cyst bilaterally.  09/12/2015 2:01 PM Maudry Mayhew, B.S., RVT, RDCS, RDMS

## 2015-09-12 NOTE — Progress Notes (Addendum)
[facility] PROGRESS NOTE  Victoria Fisher [id number] DOB: 09/21/1934 DOA: 09/09/2015 PCP: Haywood Pao, MD Admit HPI / Brief Narrative: 81 y.o.WF PMHx Depression, Anxiety, , HLD, COPD, Asthma, OSA not on CPAP, Hypothyroidism, Atrial fibrillation not on anticoagulation, Chronic Diastolic CHF, HTN, CKD-III, TB, Frequent falls, Colon Cancer S/P  colon resection, IBS, and chronic neck brace use   whopresented with AMSand a fall.  The pt lives in an assisted living facility, and was found in the floor by her husband unresponsivene. She was at her normal baseline prior to this. Per EMS her initial CBG was 50.  This improved to 90 after D50, but dropped to 30 onarrival to this ER.   HPI/Subjective: 7/27 A/O 4, admits she has frequent falls. On large amount of narcotics secondary to lymphedema/bilateral lower extremity edema/pain described as burning,electrical bilateral lower extremities. Has never been to pain clinic. States she and her husband live in an independent living facility. Husband has significant dementia per patient. Freely admits they need to probably move up to a assisted living facility for safety purposes.     Assessment/Plan:  Acute encephalopathy due to medication induced Hypoglycemia -Patient reports accidentally taking her husband's diabetes medication - CBG appears to be stabilizing but we will need to continue to follow another 24 hours off dextrose IV fluid - begin regular diet and follow   Hypothermia -Due to severe hypoglycemia - resolved  Macrocytic anemia -Follow hemoglobin trend - no gross evidence of blood loss - screen for occult GI blood loss - check 0000000 and folic acid levels and iron studies to screen for a mixed etiology   Hypotension -Likely related to severe hypoglycemia - blood pressure remains somewhat borderline but is slowly improving - follow trend and cont to hold home BP meds   Suspected hematoma involving the left  sternocleidomastoid muscle -Most consistent with hematoma and consistent with history of multiple falls but given appearance on CT scan will need to be followed clinically to resolution  Hx of HTN -Hold home bp meds   HLD -Resume usual home medical therapy  Hypothyroidism -Continue home Synthroid dose  Depression and anxiety  Narcotic overuse -Patient on a significant amount of narcotics at home MS Contin 60 mg BID + morphine 30 mg TID PRN. This is most likely contributing to patient's frequent falls   Chronic pain syndrome -Patient's pain in bilateral lower extremities more consistent with neuropathy -Start Neurontin 300 mg QHS  CAD -No complaints of chest pain at this time  Atrial Fibrillation(CHA2DS2-VASc Scoreis 5) - not a candidate for anticoagulation due to frequent recurring falls -heart rate controlled  Chronic diastolic heart failure(baseline weight appears to be as low as 65-68 kilograms) -2+ bilateral lower should be edema but no other clinical symptoms to suggest volume overload  Filed Weights   09/09/15 2233  Weight: 72.1 kg (158 lb 15.2 oz)  -Lasix IV 60 mg 1, then restart home diuretics -Demadex 40 mg QAm + 20 mgQHS  Pulm HTN  Hx of Abnormality of gait and fall -a chronic issue - no acute bony fracture by CT scan of head, C-spine and maxillofacial  - patient describes falling almost every single day  - PT/OT to see   - highly doubt that she is safe to continue living in an independent living situation -Bilateral lower extremity Doppler pending  CKD stage III -baseline creatinine 1. 6-2.0 - presently stable     COPD and Asthma -when necessary albuterol nebulizers    Goals of  care -7/26 CSW consult patient with frequent falls, living with husband with dementia at independent living facility. Would like to move with husband to assisted living facility for safety.    DVT prophylaxis: SCD Code Status: Full Family Communication:  None Disposition Plan: Assisted living facility   Consultants: None  Procedures: None  Cultures None  Antibiotics: None        Objective: Vitals:   09/11/15 1700 09/11/15 2023 09/11/15 2346 09/12/15 0426  BP: 115/72 116/64 130/71 122/78  Pulse: 72 74 82 75  Resp: 10 17 10 13   Temp: 98.6 F (37 C) 98.9 F (37.2 C) 97.3 F (36.3 C) 98.3 F (36.8 C)  TempSrc: Oral Oral Oral Oral  SpO2: 96% 97% 97% 97%  Weight:      Height:        Intake/Output Summary (Last 24 hours) at 09/12/15 [id number] Last data filed at 09/12/15 [id number]  Gross per 24 hour  Intake              480 ml  Output             1025 ml  Net             -545 ml   Filed Weights   09/09/15 2233  Weight: 72.1 kg (158 lb 15.2 oz)     Exam: General: A/O 4, NAD No acute respiratory distress Eyes: Negative headache, double vision,negative scleral hemorrhage ENT: Negative Runny nose, negative gingival bleeding, Neck:  Negative scars, masses, torticollis, lymphadenopathy, JVD Lungs: Clear to auscultation bilaterally without wheezes or crackles Cardiovascular: Irregular irregular rhythm and rate, without murmur gallop or rub normal S1 and S2 Abdomen:negative abdominal pain, negative dysphagia, nondistended, positive soft, bowel sounds, no rebound, no ascites, no appreciable mass Extremities: No significant cyanosis, clubbing, or edema bilateral lower extremities Skin: Large hematoma along left side of face and shoulder: Hematoma along left chest wall and arm Psychiatric:  Negative depression, negative anxiety, negative fatigue, negative mania  Neurologic:  Cranial nerves II through XII intact, tongue/uvula midline, all extremities muscle strength 5/5, sensation intact throughout, negative dysarthria, negative expressive aphasia, negative receptive aphasia.     Data Reviewed: Basic Metabolic Panel:  Recent Labs Lab 09/09/15 1810 09/10/15 0403 09/11/15 0326  NA 137 138 137  K 3.8 3.7 3.6  CL 100* 105  104  CO2 30 27 27   GLUCOSE 139* 98 122*  BUN 66* 60* 53*  CREATININE 1.83* 1.63* 1.52*  CALCIUM 8.9 8.7* 9.3   Liver Function Tests:  Recent Labs Lab 09/09/15 1810 09/11/15 0326  AST 23 20  ALT 10* 11*  ALKPHOS 58 57  BILITOT 1.1 1.1  PROT 6.4* 6.2*  ALBUMIN 3.4* 3.2*   No results for input(s): LIPASE, AMYLASE in the last 168 hours. No results for input(s): AMMONIA in the last 168 hours. CBC:  Recent Labs Lab 09/09/15 1810 09/10/15 0403 09/11/15 0326  WBC 6.6 3.6* 6.0  NEUTROABS 5.4  --   --   HGB 8.5* 7.6* 8.8*  HCT 28.2* 24.7* 29.1*  MCV 102.9* 102.1* 103.2*  PLT 125* 103* 131*   Cardiac Enzymes:  Recent Labs Lab 09/09/15 1810 09/09/15 1821  CKTOTAL  --  244*  TROPONINI <0.03  --    BNP (last 3 results)  Recent Labs  09/09/15 2206  BNP 275.1*    ProBNP (last 3 results) No results for input(s): PROBNP in the last 8760 hours.  CBG:  Recent Labs Lab 09/11/15 1142 09/11/15 1713 09/11/15 2022 09/11/15  2351 09/12/15 0424  GLUCAP 97 125* 129* 130* 104*    Recent Results (from the past 240 hour(s))  MRSA PCR Screening     Status: None   Collection Time: 09/09/15 10:55 PM  Result Value Ref Range Status   MRSA by PCR NEGATIVE NEGATIVE Final    Comment:        The GeneXpert MRSA Assay (FDA approved for NASAL specimens only), is one component of a comprehensive MRSA colonization surveillance program. It is not intended to diagnose MRSA infection nor to guide or monitor treatment for MRSA infections.      Studies: No results found.  Scheduled Meds: . aspirin EC  81 mg Oral q morning - 10a  . atorvastatin  40 mg Oral q1800  . cholecalciferol  1,000 Units Oral Daily  . ferrous sulfate  325 mg Oral Q supper  . levothyroxine  112 mcg Oral QAC breakfast  . morphine  30 mg Oral BID  . sertraline  100 mg Oral Daily  . sodium chloride flush  3 mL Intravenous Q12H  . vitamin E  400 Units Oral Daily   Continuous Infusions:   Principal  Problem:   Acute encephalopathy Active Problems:   HLD (hyperlipidemia)   Depression   Essential hypertension   MYOCARDIAL INFARCTION, HX OF   Coronary atherosclerosis   ATRIAL FIBRILLATION, CHRONIC   Abnormality of gait   Fall   Hypoglycemia   Hypothermia   CKD (chronic kidney disease), stage III   Chronic diastolic CHF (congestive heart failure) ([place])    Time spent: 40 minutes    Amberly Livas, [facility] Pager [phone]. If 7PM-7AM, please contact night-coverage at [email or web address], password [facility] 09/12/2015, 8:39 AM  LOS: 3 days    Care during the described time interval was provided by me .  I have reviewed this patient's available data, including medical history, events of note, physical examination, and all test results as part of my evaluation. I have personally reviewed and interpreted all radiology studies.   Dia Crawford, MD [phone] Pager

## 2015-09-12 NOTE — Progress Notes (Signed)
Occupational Therapy Treatment Patient Details Name: Victoria Fisher MRN: [id number] DOB: Nov 01, 1934 Today's Date: 09/12/2015    History of present illness 81 y.o. female with history of hypertension, hyperlipidemia, COPD, asthma, hypothyroidism, depression, anxiety, atrial fibrillation not on anticoagulation, CKD-III, dCHF, OSA not on CPAP, TB, frequent falls, colon cancer s/p colon resection, IBS, and chronic neck brace use who presented with AMS and a fall.  Pt dx with acute encepholopathy due to medication induced hypoglycemia, hypothermia, macrocytic anemia, and hypotension.     OT comments  Focus of session on educating pt in fall prevention using DME and AE to assist pt with ADL. Pt appreciative of all education. Pt remains a high fall risk and recommendation continues to be SNF or at the very least ALF with close 24 hour supervision.   Follow Up Recommendations  Home health OT;Supervision/Assistance - 24 hour (Pt is declining SNF)    Equipment Recommendations  None recommended by OT    Recommendations for Other Services      Precautions / Restrictions Precautions Precautions: Fall Precaution Comments: significant PMHx of frequent falls with head trauma       Mobility Bed Mobility Overal bed mobility: Needs Assistance Bed Mobility: Supine to Sit;Sit to Supine   Supine to sit: Min assist Sit to supine: Min assist  General bed mobility comments: min assist for trunk with supine to sit and for LEs with return to supine  Transfers Overall transfer level: Needs assistance Equipment used: Rolling walker (2 wheeled) Transfers: Sit to/from Stand Sit to Stand: Min assist         General transfer comment: required slightly more assist from bed vs surfaces with arms    Balance Overall balance assessment: Needs assistance Sitting-balance support: Feet supported;Bilateral upper extremity supported Sitting balance-Leahy Scale: Fair     Standing balance support:  Bilateral upper extremity supported Standing balance-Leahy Scale: Poor Standing balance comment: Needs external support for balance in standing (either a hand down or support from therapist)                   ADL Overall ADL's : Needs assistance/impaired     Grooming: Min guard;Standing;Wash/dry hands         Lower Body Bathing Details (indicate cue type and reason): recommended pt use long bath sponge and sit to shower Upper Body Dressing : Set up;Supervision/safety;Sitting   Lower Body Dressing: Min guard;Sit to/from stand Lower Body Dressing Details (indicate cue type and reason): educated pt in safe footwear Toilet Transfer: Minimal assistance;Ambulation;BSC;RW   Toileting- Clothing Manipulation and Hygiene: Minimal assistance;Sit to/from stand       Functional mobility during ADLs: Minimal assistance;Rolling walker General ADL Comments: Focus of session on safety and fall prevention. Recommended use of reacher for retrieving items from floor, reaching into cabinets to minimize risk of falls. Offered pt information on the [employer].      Vision                 Additional Comments: pt reports hx of intermittent diplopia, none currently   Perception     Praxis      Cognition   Behavior During Therapy: WFL for tasks assessed/performed Overall Cognitive Status: Impaired/Different from baseline Area of Impairment: Memory;Safety/judgement     Memory: Decreased short-term memory    Safety/Judgement: Decreased awareness of safety;Decreased awareness of deficits          Extremity/Trunk Assessment  Exercises     Shoulder Instructions       General Comments      Pertinent Vitals/ Pain       Pain Assessment: Faces Faces Pain Scale: Hurts a little bit Pain Location: neck, face Pain Descriptors / Indicators: Aching Pain Intervention(s): Monitored during session  Home Living                                           Prior Functioning/Environment              Frequency Min 2X/week     Progress Toward Goals  OT Goals(current goals can now be found in the care plan section)  Progress towards OT goals: Progressing toward goals  Acute Rehab OT Goals Patient Stated Goal: to go home and transition to ALF if she can be with her husband in ALF Time For Goal Achievement: 09/25/15 Potential to Achieve Goals: Good  Plan Discharge plan remains appropriate    Co-evaluation                 End of Session Equipment Utilized During Treatment: Gait belt;Rolling walker;Cervical collar   Activity Tolerance Patient tolerated treatment well   Patient Left in bed;with call bell/phone within reach;with family/visitor present   Nurse Communication          Time: [id number] OT Time Calculation (min): 18 min  Charges: OT General Charges $OT Visit: 1 Procedure OT Treatments $Self Care/Home Management : 8-22 mins  Malka So 09/12/2015, 4:28 PM  [phone]

## 2015-09-12 NOTE — Progress Notes (Signed)
Physical Therapy Treatment Patient Details Name: Victoria Fisher MRN: [id number] DOB: September 05, 1934 Today's Date: 09/12/2015    History of Present Illness 81 y.o. female with history of hypertension, hyperlipidemia, COPD, asthma, hypothyroidism, depression, anxiety, atrial fibrillation not on anticoagulation, CKD-III, dCHF, OSA not on CPAP, TB, frequent falls, colon cancer s/p colon resection, IBS, and chronic neck brace use who presented with AMS and a fall.  Pt dx with acute encepholopathy due to medication induced hypoglycemia, hypothermia, macrocytic anemia, and hypotension.      PT Comments    Pt continues to show signs of horizontal canal BPPV (toady left (+), right (-)) and has limited tolerance of repositioning maneuvers due to her cervical ROM deficits.  She continues to be a very high fall risk and would be risking a fatal injury if she goes back to independent living without 24/7 assist.  I did speak with her about the idea of ALF and she is fearful that by going to ALF she and her husband would be separated due to his memory issues.  She has not investigated this fear at all, nor does it sound like she would be moving into ALF any time soon given her own free choices.  I would, if she doesn't agree to SNF level rehab, recommend [facility] services at discharge, and if she can afford, increased hours of her in-house aide.    Follow Up Recommendations  Home health PT;Supervision/Assistance - 24 hour (please have HHPT request vestibular PT)     Equipment Recommendations  None recommended by PT (agree she would be safer at SNF, she is 1 caregiver for husb)    Recommendations for Other Services   NA     Precautions / Restrictions Precautions Precautions: Fall Precaution Comments: significant PMHx of frequent falls with head trauma    Mobility  Bed Mobility Overal bed mobility: Needs Assistance Bed Mobility: Rolling;Sidelying to Sit;Sit to Sidelying;Sit to Supine Rolling: Min  assist Sidelying to sit: Min assist Supine to sit: Min assist Sit to supine: Min assist Sit to sidelying: Min assist General bed mobility comments: Min assist for all bed mobility related to treatment of her vertigo  Transfers Overall transfer level: Needs assistance Equipment used: Rolling walker (2 wheeled) Transfers: Sit to/from Stand Sit to Stand: Min assist         General transfer comment: Up to heavy min assist depending on the height of the sitting surface (lower surface, more assist), to support trunk to power up to standing and anteriorly weight shift pt forward.   Ambulation/Gait Ambulation/Gait assistance: Min assist Ambulation Distance (Feet): 150 Feet Assistive device: Rolling walker (2 wheeled) Gait Pattern/deviations: Step-through pattern;Shuffle;Trunk flexed Gait velocity: decreased   General Gait Details: pt with kyphotic thoracic spine, with inability to hold head in an upright position, at times, pt needs up to heavy min assist to prevent LOB even when using RW.  She uses a rollator at baseline, so will try mobility with rollator next session.           Balance Overall balance assessment: Needs assistance Sitting-balance support: Feet supported;Bilateral upper extremity supported Sitting balance-Leahy Scale: Fair     Standing balance support: Bilateral upper extremity supported Standing balance-Leahy Scale: Poor Standing balance comment: Needs external support for balance in standing (either a hand down or support from therapist)       09/12/15 1412  Vestibular Assessment  General Observation Pt reports feeling better than yesterday, less dizzy at beginning of session.   Symptom Behavior  Type of Dizziness Spinning  Frequency of Dizziness with transitions (rolling, supine to sit, sit to supine, sit to/from stand)  Duration of Dizziness <1 min  Aggravating Factors Supine to sit;Sit to stand;Forward bending  Relieving Factors Head  stationary;Closing eyes  Occulomotor Exam  Occulomotor Alignment Normal  Spontaneous Absent  Gaze-induced Absent  Positional Testing  Sidelying Test Sidelying Right;Sidelying Left  Sidelying Right  Sidelying Right Duration none  Sidelying Right Symptoms No nystagmus  Sidelying Left  Sidelying Left Duration <1 min  Sidelying Left Symptoms Left nystagmus (geotropic)   Preformed same modified BBQ roll as last session, however, pt had difficulty, due to nausea, maintaining appropriate positioning during the maneuver to likely have done any good.  We may have to habituate and compensate until the BPPV goes away as she is not able to get into any of the horizontal canal BPPV canalith repositioning positions due to her significant head and neck contractures/deformities/pain.  It seems as though her right ear has cleared, however, her left ear horizontal canal BPPV (which was symptomatic yesterday, but less so than the right side) is now VERY symptomatic.  She will require continued assessment and f/u for this as it will make her balance deficits worse.                  Cognition Arousal/Alertness: Awake/alert Behavior During Therapy: WFL for tasks assessed/performed Overall Cognitive Status: Impaired/Different from baseline Area of Impairment: Memory     Memory: Decreased short-term memory                     Pertinent Vitals/Pain Pain Assessment: Faces Faces Pain Scale: Hurts a little bit Pain Location: neck when up and walking, this is chronic pain for her Pain Descriptors / Indicators: Aching Pain Intervention(s): Limited activity within patient's tolerance;Monitored during session;Repositioned           PT Goals (current goals can now be found in the care plan section) Acute Rehab PT Goals Patient Stated Goal: to go home and transition to ALF if she can be with her husband in ALF Progress towards PT goals: Progressing toward goals    Frequency  Min 3X/week     PT Plan Current plan remains appropriate       End of Session Equipment Utilized During Treatment: Gait belt Activity Tolerance: Other (comment) (limited by dizziness and nausea at end of session) Patient left: in bed;with call bell/phone within reach;with bed alarm set;with family/visitor present     Time: [id number] PT Time Calculation (min) (ACUTE ONLY): 32 min  Charges:  $Gait Training: 8-22 mins $Therapeutic Activity: 8-22 mins                      Oluwasemilore Bahl B. [place], [place], DPT [phone]   09/12/2015, 2:14 PM

## 2015-09-12 NOTE — Clinical Social Work Note (Signed)
Per MD report, patient open to moving to an ALF. He has concerns of patient falling at home with her demented husband. CSW's do not do placement to ALF's from hospital. [place] spoke with assistant director of social work on [employer] and he stated that all CSW could really do is provide ALF list and patient and her family would have to do their own search for placement. He did say that RNCM could set her up with a [facility] CSW who could assist with the progress.   CSW provided list to patient. Son at bedside. Patient and son appreciated social work intervention. MD and RN aware.  Dayton Scrape, [place]

## 2015-09-13 ENCOUNTER — Encounter (HOSPITAL_COMMUNITY): Payer: Self-pay

## 2015-09-13 DIAGNOSIS — S060X9A Concussion with loss of consciousness of unspecified duration, initial encounter: Secondary | ICD-10-CM

## 2015-09-13 DIAGNOSIS — S060XAA Concussion with loss of consciousness status unknown, initial encounter: Secondary | ICD-10-CM

## 2015-09-13 DIAGNOSIS — G894 Chronic pain syndrome: Secondary | ICD-10-CM

## 2015-09-13 DIAGNOSIS — I482 Chronic atrial fibrillation: Secondary | ICD-10-CM

## 2015-09-13 DIAGNOSIS — E785 Hyperlipidemia, unspecified: Secondary | ICD-10-CM

## 2015-09-13 DIAGNOSIS — F411 Generalized anxiety disorder: Secondary | ICD-10-CM

## 2015-09-13 DIAGNOSIS — D649 Anemia, unspecified: Secondary | ICD-10-CM

## 2015-09-13 DIAGNOSIS — I959 Hypotension, unspecified: Secondary | ICD-10-CM | POA: Diagnosis present

## 2015-09-13 DIAGNOSIS — R269 Unspecified abnormalities of gait and mobility: Secondary | ICD-10-CM

## 2015-09-13 DIAGNOSIS — I950 Idiopathic hypotension: Secondary | ICD-10-CM

## 2015-09-13 DIAGNOSIS — W19XXXA Unspecified fall, initial encounter: Secondary | ICD-10-CM

## 2015-09-13 LAB — GLUCOSE, CAPILLARY
GLUCOSE-CAPILLARY: 107 mg/dL — AB (ref 65–99)
GLUCOSE-CAPILLARY: 79 mg/dL (ref 65–99)
GLUCOSE-CAPILLARY: 86 mg/dL (ref 65–99)
Glucose-Capillary: 87 mg/dL (ref 65–99)

## 2015-09-13 MED ORDER — MORPHINE SULFATE 15 MG PO TABS: 15.0000 mg | ORAL_TABLET | Freq: Three times a day (TID) | ORAL | 0 refills | Status: DC | PRN

## 2015-09-13 MED ORDER — GABAPENTIN 600 MG PO TABS: 300.0000 mg | ORAL_TABLET | Freq: Every day | ORAL | 0 refills | Status: DC

## 2015-09-13 MED ORDER — ZOLPIDEM TARTRATE 5 MG PO TABS: 5.0000 mg | ORAL_TABLET | Freq: Once | ORAL | Status: DC

## 2015-09-13 MED ORDER — MORPHINE SULFATE ER 30 MG PO TBCR: 30.0000 mg | EXTENDED_RELEASE_TABLET | Freq: Two times a day (BID) | ORAL | 0 refills | Status: DC

## 2015-09-13 NOTE — Care Management Important Message (Signed)
Important Message  Patient Details  Name: Victoria Fisher MRN: [id number] Date of Birth: 21-Jun-1934   Medicare Important Message Given:  Yes    Loann Quill 09/13/2015, 10:39 AM

## 2015-09-13 NOTE — Discharge Summary (Signed)
Physician Discharge Summary  Victoria Fisher [id number] DOB: 1934/04/30 DOA: 09/09/2015  PCP: Haywood Pao, MD  Admit date: 09/09/2015 Discharge date: 09/13/2015  Time spent: 35 minutes  Recommendations for Outpatient Follow-up:  Acute encephalopathy due to medication induced Hypoglycemia -Patient reports accidentally taking her husband's diabetes medication, resolved  Hypothermia -Due to severe hypoglycemia - resolved  Macrocytic anemia -Stable, asymptomatic  -Anemia studies WNL, if required may be worked up by PCP   Hypotension/HTN -Resolved, likely multifactorial to include severe hypoglycemia, narcotic overuse, and iatrogenic (BP medication)  -Given patient's age and fall risk factor has been counseled SBP goal~140-150    Suspected hematoma involving the left sternocleidomastoid muscle -Most consistent with hematoma and consistent with history of multiple falls. -Resolving  HLD -Lipitor 40 mg daily   Hypothyroidism -Synthroid 112 g daily   Depression and anxiety -Zoloft 100 mg daily  Narcotic overuse -Patient on a significant amount of narcotics at home MS Contin 60 mg BID + morphine 30 mg TID PRN. This is most likely contributing to patient's frequent falls  -Will be discharged on MS Contin 30 mg BID -Will be discharged on morphine IR 15 mg TID PRN breakthrough pain -See chronic pain syndrome  Chronic pain syndrome -Patient's pain in bilateral lower extremities more consistent with neuropathy -Neurontin 300 mg QHS -Scheduled establish care appointment for pain management with Dr. Delice Lesch at [facility] physical Medicine and Rehabilitation within 2 weeks of discharge. Addendum patient has scheduled appointment for 31 July at 0900  CAD -No complaints of chest pain at this time  Atrial Fibrillation(CHA2DS2-VASc Scoreis 5) - not a candidate for anticoagulation due to frequent recurring falls  -heart rate controlled  Chronic diastolic heart  failure(baseline weight appears to be as low as 65-68 kilograms) -2+ bilateral lower should be edema but no other clinical symptoms to suggest volume overload   Pulm HTN  Hx of Abnormality of gait and fall -a chronic issue - no acute bony fracture by CT scan of head, C-spine and maxillofacial  - patient describes falling almost every single day  - PT/OT;  initially recommended SNF but patient declined requires 24-hour supervision  -Patient willing to speak with CSW as outpatient to consider assisted living for herself and husband. -Will schedule home RN, PT, CSW  -Bilateral lower extremity Doppler negative  CKD stage III(baseline Cr 1. 6-2.0)  Lab Results  Component Value Date   CREATININE 1.38 (H) 09/12/2015   CREATININE 1.52 (H) 09/11/2015   CREATININE 1.63 (H) 09/10/2015  -Resolved  COPD and Asthma -when necessary albuterol nebulizers         Discharge Diagnoses:  Principal Problem:   Acute encephalopathy Active Problems:   HLD (hyperlipidemia)   Depression   Essential hypertension   MYOCARDIAL INFARCTION, HX OF   Coronary atherosclerosis   ATRIAL FIBRILLATION, CHRONIC   Abnormality of gait   Fall   Hypoglycemia   Hypothermia   CKD (chronic kidney disease), stage III   Chronic diastolic CHF (congestive heart failure) (HCC)   Concussion   Idiopathic hypotension   Anxiety state   Chronic pain syndrome   Discharge Condition: Stable  Diet recommendation: Heart healthy  Filed Weights   09/09/15 2233 09/12/15 0846 09/13/15 0500  Weight: 72.1 kg (158 lb 15.2 oz) 74.8 kg (164 lb 14.4 oz) 73.1 kg (161 lb 2.5 oz)    History of present illness:  81 y.o.WF PMHx Depression, Anxiety, , HLD, COPD, Asthma, OSA not on CPAP, Hypothyroidism, Atrial fibrillation not on anticoagulation, Chronic  Diastolic CHF, HTN, CKD-III, TB, Frequent falls, Colon Cancer S/P  colon resection, IBS, and chronic neck brace use  whopresentedwith AMSand a fall. The pt lives in an  assisted living facility, and was found in the floor by her husband unresponsivene. She was at her normal baseline prior to this. Per EMS her initial CBG was 50. This improved to 90 after D50, but dropped to 30 onarrival to this ER. During this hospitalization patient was evaluated for acute encephalopathy most likely multifactorial to include taking her husband's medication by mistake, iatrogenic Hypotension, narcotic overuse. Patient was negative for acute infarct. After reduction of narcotic medication, and holding of BP medication patient able to ambulate without difficulty.    Procedures: 7/24 CT head WO contrast: Negative for acute infarct 7/27 bilateral lower extremity Doppler; negative for DVT      Discharge Exam: Vitals:   09/13/15 0500 09/13/15 0750 09/13/15 1258 09/13/15 1300  BP:  134/75  (!) 151/74  Pulse:  87 (!) 132 87  Resp:  10  12  Temp:  98.4 F (36.9 C)  98.3 F (36.8 C)  TempSrc:  Oral  Axillary  SpO2:  100%  95%  Weight: 73.1 kg (161 lb 2.5 oz)     Height:        General: A/O 4, NAD No acute respiratory distress Eyes: Negative headache, double vision,negative scleral hemorrhage ENT: Negative Runny nose, negative gingival bleeding, Neck:  Negative scars, masses, torticollis, lymphadenopathy, JVD Lungs: Clear to auscultation bilaterally without wheezes or crackles Cardiovascular: Irregular irregular rhythm and rate, without murmur gallop or rub normal S1 and S2 Abdomen:negative abdominal pain, negative dysphagia, nondistended, positive soft, bowel sounds, no rebound, no ascites, no appreciable mass Extremities: No significant cyanosis, clubbing, or edema bilateral lower extremities Skin: Large hematoma along left side of face and shoulder: Hematoma along left chest wall and arm Psychiatric:  Negative depression, negative anxiety, negative fatigue, negative mania  Neurologic:  Cranial nerves II through XII intact, tongue/uvula midline, all extremities  muscle strength 5/5, sensation intact throughout, negative dysarthria, negative expressive aphasia, negative receptive aphasia.     Discharge Instructions     Medication List    STOP taking these medications   lisinopril 20 MG tablet Commonly known as:  PRINIVIL,ZESTRIL   metolazone 2.5 MG tablet Commonly known as:  ZAROXOLYN   metoprolol succinate 25 MG 24 hr tablet Commonly known as:  TOPROL-XL   metoprolol succinate 50 MG 24 hr tablet Commonly known as:  TOPROL-XL   potassium chloride 10 MEQ tablet Commonly known as:  K-DUR   torsemide 20 MG tablet Commonly known as:  DEMADEX   zolpidem 6.25 MG CR tablet Commonly known as:  AMBIEN CR     TAKE these medications   aspirin EC 81 MG tablet Take 81 mg by mouth every morning.   atorvastatin 40 MG tablet Commonly known as:  LIPITOR Take 40 mg by mouth every evening.   baclofen 10 MG tablet Commonly known as:  LIORESAL Take 0.5 tablets (5 mg total) by mouth 2 (two) times daily.   cholecalciferol 1000 units tablet Commonly known as:  VITAMIN D Take 1,000 Units by mouth daily with breakfast.   ferrous sulfate 325 (65 FE) MG tablet Take 325 mg by mouth daily.   gabapentin 600 MG tablet Commonly known as:  NEURONTIN Take 0.5 tablets (300 mg total) by mouth at bedtime.   levalbuterol 45 MCG/ACT inhaler Commonly known as:  XOPENEX HFA Inhale 1 puff into the lungs every  4 (four) hours as needed for wheezing.   levothyroxine 112 MCG tablet Commonly known as:  SYNTHROID, LEVOTHROID Take 1 tablet (112 mcg total) by mouth every morning. What changed:  when to take this   magnesium oxide 400 (241.3 Mg) MG tablet Commonly known as:  MAG-OX Take 1 tablet (400 mg total) by mouth daily.   morphine 30 MG 12 hr tablet Commonly known as:  MS CONTIN Take 1 tablet (30 mg total) by mouth 2 (two) times daily. What changed:  medication strength  how much to take   morphine 15 MG tablet Commonly known as:  MSIR Take  1 tablet (15 mg total) by mouth 3 (three) times daily as needed (breakthrough pain). What changed:  medication strength  how much to take   nitroGLYCERIN 0.4 MG/SPRAY spray Commonly known as:  NITROLINGUAL Place 1 spray under the tongue every 5 (five) minutes as needed for chest pain.   sennosides-docusate sodium 8.6-50 MG tablet Commonly known as:  SENOKOT-S Take 2-3 tablets by mouth daily as needed for constipation.   sertraline 100 MG tablet Commonly known as:  ZOLOFT Take 100 mg by mouth daily with breakfast.   triamcinolone cream 0.1 % Commonly known as:  KENALOG Apply 1 application topically daily as needed (eczema).      Allergies  Allergen Reactions  . Codeine Anaphylaxis, Hives and Nausea And Vomiting  . Ciprofloxacin Hives and Nausea And Vomiting  . Latex Hives and Rash  . Penicillins Hives    Has patient had a PCN reaction causing immediate rash, facial/tongue/throat swelling, SOB or lightheadedness with hypotension: Yes Has patient had a PCN reaction causing severe rash involving mucus membranes or skin necrosis: Yes Has patient had a PCN reaction that required hospitalization Yes Has patient had a PCN reaction occurring within the last 10 years: Yes If all of the above answers are "NO", then may proceed with Cephalosporin use.  . Sulfonamide Derivatives Hives and Rash   Follow-up Information    [place] .   Why:  HHRN , HHPT, Social Web designer information: [address] [place] [address] [phone]        Jamse Arn, MD. Schedule an appointment as soon as possible for a visit in 2 week(s).   Specialty:  Physical Medicine and Rehabilitation Why:  Scheduled establish care appointment for pain management with Dr. Delice Lesch at [facility] physical medicine and rehabilitation within 2 week of discharge Contact information: [place] [address] [phone]            The results of  significant diagnostics from this hospitalization (including imaging, microbiology, ancillary and laboratory) are listed below for reference.    Significant Diagnostic Studies: Ct Head Wo Contrast  Result Date: 09/09/2015 CLINICAL DATA:  Found down. Left-sided facial and neck bruising and swelling. EXAM: CT HEAD WITHOUT CONTRAST CT MAXILLOFACIAL WITHOUT CONTRAST CT CERVICAL SPINE WITHOUT CONTRAST TECHNIQUE: Multidetector CT imaging of the head, cervical spine, and maxillofacial structures were performed using the standard protocol without intravenous contrast. Multiplanar CT image reconstructions of the cervical spine and maxillofacial structures were also generated. COMPARISON:  07/16/2015. FINDINGS: CT HEAD FINDINGS Brain: There is no evidence of acute intracranial hemorrhage, mass lesion, brain edema or extra-axial fluid collection. There is stable mild generalized atrophy with mild prominence of the ventricles and subarachnoid spaces. There is no CT evidence of acute cortical infarction. Vascular: Intracranial vascular calcifications are present. Skull: Negative for fracture or focal  lesion. Sinuses/Orbits: The visualized paranasal sinuses and mastoid air cells are clear. Other: None. CT MAXILLOFACIAL FINDINGS No evidence of acute facial fracture. The mandible and temporomandibular joints are intact. There is mild mucosal thickening in the ethmoid sinuses. There is a small polyp or mucous retention cyst anteriorly in the left maxillary sinus. There are no sinus air- fluid levels. There is asymmetric left facial soft tissue swelling extending over the zygoma and into the preseptal components of the left orbit. The globes are intact. The optic nerves and extraocular muscles appear intact. There is asymmetry of the submandibular glands, larger on the left. This appears unchanged from previous study. CT CERVICAL SPINE FINDINGS 5 mm of anterolisthesis is again noted at C3-4 associated with advanced disc space  loss and advanced asymmetric facet arthropathy on the left. Appearance is similar to the prior examination. There is no evidence of acute fracture or traumatic subluxation. There is multilevel spondylosis at the other levels which is also unchanged. There is a heterogeneous mass in the left neck, measuring up to 3.0 x 2.7 cm transverse on image 49. This extends approximately 4 cm cephalocaudad. This may reflect hematoma within the left sternocleidomastoid muscle. The rapid change compared with the relatively recent prior study would argue against a developing mass. There are additional prominent cervical lymph nodes which are otherwise unchanged. The thoracic esophagus is dilated and fluid-filled. Emphysematous changes are present at the lung apices. IMPRESSION: 1. No acute intracranial or calvarial findings. 2. No evidence of acute facial fracture. There is soft tissue injury in the left face. No evidence of orbital hematoma. 3. No evidence of acute cervical spine fracture, traumatic subluxation or static signs of instability. Chronic degenerative anterolisthesis at C3-4 secondary to advanced asymmetric left-sided facet disease appears unchanged. 4. Suspected hematoma involving the left sternocleidomastoid muscle. A mass is difficult to completely exclude based on this examination, although this finding has developed since the prior study of less than 2 months ago. Other left cervical lymph nodes appear unchanged. Clinical follow up recommended. If there is concern of a persistent mass lesion in the neck, follow-up neck CT with contrast may be helpful. Electronically Signed   By: Richardean Sale M.D.   On: 09/09/2015 19:41  Ct Cervical Spine Wo Contrast  Result Date: 09/09/2015 CLINICAL DATA:  Found down. Left-sided facial and neck bruising and swelling. EXAM: CT HEAD WITHOUT CONTRAST CT MAXILLOFACIAL WITHOUT CONTRAST CT CERVICAL SPINE WITHOUT CONTRAST TECHNIQUE: Multidetector CT imaging of the head, cervical  spine, and maxillofacial structures were performed using the standard protocol without intravenous contrast. Multiplanar CT image reconstructions of the cervical spine and maxillofacial structures were also generated. COMPARISON:  07/16/2015. FINDINGS: CT HEAD FINDINGS Brain: There is no evidence of acute intracranial hemorrhage, mass lesion, brain edema or extra-axial fluid collection. There is stable mild generalized atrophy with mild prominence of the ventricles and subarachnoid spaces. There is no CT evidence of acute cortical infarction. Vascular: Intracranial vascular calcifications are present. Skull: Negative for fracture or focal lesion. Sinuses/Orbits: The visualized paranasal sinuses and mastoid air cells are clear. Other: None. CT MAXILLOFACIAL FINDINGS No evidence of acute facial fracture. The mandible and temporomandibular joints are intact. There is mild mucosal thickening in the ethmoid sinuses. There is a small polyp or mucous retention cyst anteriorly in the left maxillary sinus. There are no sinus air- fluid levels. There is asymmetric left facial soft tissue swelling extending over the zygoma and into the preseptal components of the left orbit. The globes are intact.  The optic nerves and extraocular muscles appear intact. There is asymmetry of the submandibular glands, larger on the left. This appears unchanged from previous study. CT CERVICAL SPINE FINDINGS 5 mm of anterolisthesis is again noted at C3-4 associated with advanced disc space loss and advanced asymmetric facet arthropathy on the left. Appearance is similar to the prior examination. There is no evidence of acute fracture or traumatic subluxation. There is multilevel spondylosis at the other levels which is also unchanged. There is a heterogeneous mass in the left neck, measuring up to 3.0 x 2.7 cm transverse on image 49. This extends approximately 4 cm cephalocaudad. This may reflect hematoma within the left sternocleidomastoid  muscle. The rapid change compared with the relatively recent prior study would argue against a developing mass. There are additional prominent cervical lymph nodes which are otherwise unchanged. The thoracic esophagus is dilated and fluid-filled. Emphysematous changes are present at the lung apices. IMPRESSION: 1. No acute intracranial or calvarial findings. 2. No evidence of acute facial fracture. There is soft tissue injury in the left face. No evidence of orbital hematoma. 3. No evidence of acute cervical spine fracture, traumatic subluxation or static signs of instability. Chronic degenerative anterolisthesis at C3-4 secondary to advanced asymmetric left-sided facet disease appears unchanged. 4. Suspected hematoma involving the left sternocleidomastoid muscle. A mass is difficult to completely exclude based on this examination, although this finding has developed since the prior study of less than 2 months ago. Other left cervical lymph nodes appear unchanged. Clinical follow up recommended. If there is concern of a persistent mass lesion in the neck, follow-up neck CT with contrast may be helpful. Electronically Signed   By: Richardean Sale M.D.   On: 09/09/2015 19:41  Dg Chest Port 1 View  Result Date: 09/09/2015 CLINICAL DATA:  Altered mental status.  Fell 3 days ago. EXAM: PORTABLE CHEST 1 VIEW COMPARISON:  07/07/2013 FINDINGS: Stable cardiac enlargement. Stable tortuosity and calcification of the thoracic aorta. Mild central vascular congestion but no pulmonary edema or pleural effusions. The bony thorax is grossly intact. IMPRESSION: Stable cardiac enlargement. Mild central vascular congestion but no edema, effusions or infiltrates. Electronically Signed   By: Marijo Sanes M.D.   On: 09/09/2015 18:30  Ct Maxillofacial Wo Cm  Result Date: 09/09/2015 CLINICAL DATA:  Found down. Left-sided facial and neck bruising and swelling. EXAM: CT HEAD WITHOUT CONTRAST CT MAXILLOFACIAL WITHOUT CONTRAST CT  CERVICAL SPINE WITHOUT CONTRAST TECHNIQUE: Multidetector CT imaging of the head, cervical spine, and maxillofacial structures were performed using the standard protocol without intravenous contrast. Multiplanar CT image reconstructions of the cervical spine and maxillofacial structures were also generated. COMPARISON:  07/16/2015. FINDINGS: CT HEAD FINDINGS Brain: There is no evidence of acute intracranial hemorrhage, mass lesion, brain edema or extra-axial fluid collection. There is stable mild generalized atrophy with mild prominence of the ventricles and subarachnoid spaces. There is no CT evidence of acute cortical infarction. Vascular: Intracranial vascular calcifications are present. Skull: Negative for fracture or focal lesion. Sinuses/Orbits: The visualized paranasal sinuses and mastoid air cells are clear. Other: None. CT MAXILLOFACIAL FINDINGS No evidence of acute facial fracture. The mandible and temporomandibular joints are intact. There is mild mucosal thickening in the ethmoid sinuses. There is a small polyp or mucous retention cyst anteriorly in the left maxillary sinus. There are no sinus air- fluid levels. There is asymmetric left facial soft tissue swelling extending over the zygoma and into the preseptal components of the left orbit. The globes are intact. The  optic nerves and extraocular muscles appear intact. There is asymmetry of the submandibular glands, larger on the left. This appears unchanged from previous study. CT CERVICAL SPINE FINDINGS 5 mm of anterolisthesis is again noted at C3-4 associated with advanced disc space loss and advanced asymmetric facet arthropathy on the left. Appearance is similar to the prior examination. There is no evidence of acute fracture or traumatic subluxation. There is multilevel spondylosis at the other levels which is also unchanged. There is a heterogeneous mass in the left neck, measuring up to 3.0 x 2.7 cm transverse on image 49. This extends  approximately 4 cm cephalocaudad. This may reflect hematoma within the left sternocleidomastoid muscle. The rapid change compared with the relatively recent prior study would argue against a developing mass. There are additional prominent cervical lymph nodes which are otherwise unchanged. The thoracic esophagus is dilated and fluid-filled. Emphysematous changes are present at the lung apices. IMPRESSION: 1. No acute intracranial or calvarial findings. 2. No evidence of acute facial fracture. There is soft tissue injury in the left face. No evidence of orbital hematoma. 3. No evidence of acute cervical spine fracture, traumatic subluxation or static signs of instability. Chronic degenerative anterolisthesis at C3-4 secondary to advanced asymmetric left-sided facet disease appears unchanged. 4. Suspected hematoma involving the left sternocleidomastoid muscle. A mass is difficult to completely exclude based on this examination, although this finding has developed since the prior study of less than 2 months ago. Other left cervical lymph nodes appear unchanged. Clinical follow up recommended. If there is concern of a persistent mass lesion in the neck, follow-up neck CT with contrast may be helpful. Electronically Signed   By: Richardean Sale M.D.   On: 09/09/2015 19:41   Microbiology: Recent Results (from the past 240 hour(s))  MRSA PCR Screening     Status: None   Collection Time: 09/09/15 10:55 PM  Result Value Ref Range Status   MRSA by PCR NEGATIVE NEGATIVE Final    Comment:        The GeneXpert MRSA Assay (FDA approved for NASAL specimens only), is one component of a comprehensive MRSA colonization surveillance program. It is not intended to diagnose MRSA infection nor to guide or monitor treatment for MRSA infections.      Labs: Basic Metabolic Panel:  Recent Labs Lab 09/09/15 1810 09/10/15 0403 09/11/15 0326 09/12/15 0912  NA 137 138 137 141  K 3.8 3.7 3.6 3.6  CL 100* 105 104  105  CO2 30 27 27 28   GLUCOSE 139* 98 122* 115*  BUN 66* 60* 53* 40*  CREATININE 1.83* 1.63* 1.52* 1.38*  CALCIUM 8.9 8.7* 9.3 9.4  MG  --   --   --  2.4   Liver Function Tests:  Recent Labs Lab 09/09/15 1810 09/11/15 0326  AST 23 20  ALT 10* 11*  ALKPHOS 58 57  BILITOT 1.1 1.1  PROT 6.4* 6.2*  ALBUMIN 3.4* 3.2*   No results for input(s): LIPASE, AMYLASE in the last 168 hours. No results for input(s): AMMONIA in the last 168 hours. CBC:  Recent Labs Lab 09/09/15 1810 09/10/15 0403 09/11/15 0326  WBC 6.6 3.6* 6.0  NEUTROABS 5.4  --   --   HGB 8.5* 7.6* 8.8*  HCT 28.2* 24.7* 29.1*  MCV 102.9* 102.1* 103.2*  PLT 125* 103* 131*   Cardiac Enzymes:  Recent Labs Lab 09/09/15 1810 09/09/15 1821  CKTOTAL  --  244*  TROPONINI <0.03  --    BNP: BNP (last  3 results)  Recent Labs  09/09/15 2206  BNP 275.1*    ProBNP (last 3 results) No results for input(s): PROBNP in the last 8760 hours.  CBG:  Recent Labs Lab 09/12/15 2029 09/12/15 2351 09/13/15 0411 09/13/15 0754 09/13/15 1302  GLUCAP 238* 107* 86 79 87       Signed:  Dia Crawford, MD [facility] [phone] pager

## 2015-09-13 NOTE — Progress Notes (Signed)
Physical Therapy Treatment Patient Details Name: Victoria Fisher MRN: [id number] DOB: 01/27/1935 Today's Date: 09/13/2015    History of Present Illness 81 y.o. female with history of hypertension, hyperlipidemia, COPD, asthma, hypothyroidism, depression, anxiety, atrial fibrillation not on anticoagulation, CKD-III, dCHF, OSA not on CPAP, TB, frequent falls, colon cancer s/p colon resection, IBS, and chronic neck brace use who presented with AMS and a fall.  Pt dx with acute encepholopathy due to medication induced hypoglycemia, hypothermia, macrocytic anemia, and hypotension.      PT Comments    Pt's affect is a little down today.  She reports increased fatigue and no appetite.  She was able to demonstrate use of 4 wheeled walker without increase in gait instability.  Verbal cues were given for safe break use.  HR increased to 132 during gait, pt asymptomatic.  BP and O2 sats remained stable during session.  Pt continues to be a high fall risk, but does report decreased dizziness during mobility today.    Follow Up Recommendations  Home health PT;Supervision/Assistance - 24 hour (vestibular HHPT, would be safer at SNF)     Equipment Recommendations  None recommended by PT    Recommendations for Other Services   NA     Precautions / Restrictions Precautions Precautions: Fall Precaution Comments: significant PMHx of frequent falls with head trauma    Mobility  Bed Mobility               General bed mobility comments: Pt was OOB in chair  Transfers Overall transfer level: Needs assistance Equipment used: 4-wheeled walker Transfers: Sit to/from Stand Sit to Stand: Min guard         General transfer comment: Min guard assist for safety during transitions.  Verbal cues to lock the 4 wheeled walker breaks before getting up and before sitting down  Ambulation/Gait Ambulation/Gait assistance: Min guard Ambulation Distance (Feet): 150 Feet Assistive device: 4-wheeled  walker Gait Pattern/deviations: Step-through pattern;Trunk flexed     General Gait Details: Pt with increased gait speed today without significant increase in gait instability with increased speed.  She seems to have a better time turning with this kind of walker.  She still had a stagger requiring min guard assist when backing up to the chair.           Balance Overall balance assessment: Needs assistance Sitting-balance support: Feet supported;Bilateral upper extremity supported Sitting balance-Leahy Scale: Fair     Standing balance support: Bilateral upper extremity supported Standing balance-Leahy Scale: Poor           09/13/15 1409  Vestibular Assessment  General Observation Pt reports feeling better today, had a HA after our session yesterday  Symptom Behavior  Type of Dizziness Spinning  Frequency of Dizziness with transitions (rolling, supine to sit, sit to supine, sit to/from stand)  Duration of Dizziness <1 min  Aggravating Factors Supine to sit;Sit to stand;Forward bending  Relieving Factors Head stationary;Closing eyes  Occulomotor Exam  Occulomotor Alignment Normal  Spontaneous Absent  Gaze-induced Absent  Head shaking Horizontal Absent  Head Shaking Vertical Absent  Smooth Pursuits Intact  Saccades Intact  Comment No difficulty with gaze stability.  This reinforces that this may strictly be a peripherial BPPV that is difficult to clear due to lack of cervical ROM.                   Cognition Arousal/Alertness: Awake/alert Behavior During Therapy: WFL for tasks assessed/performed Overall Cognitive Status: Impaired/Different from baseline Area of  Impairment: Memory                           Pertinent Vitals/Pain Pain Assessment: Faces Faces Pain Scale: Hurts little more Pain Location: neck, when walking Pain Descriptors / Indicators: Guarding;Grimacing Pain Intervention(s): Limited activity within patient's tolerance;Monitored during  session;Repositioned           PT Goals (current goals can now be found in the care plan section) Acute Rehab PT Goals Patient Stated Goal: to get home today before the transportation service at [facility] stops.  Progress towards PT goals: Progressing toward goals    Frequency  Min 3X/week    PT Plan Current plan remains appropriate       End of Session Equipment Utilized During Treatment: Gait belt Activity Tolerance: Patient limited by fatigue Patient left: in chair;with call bell/phone within reach     Time: 1240-1258 PT Time Calculation (min) (ACUTE ONLY): 18 min  Charges:  $Gait Training: 8-22 mins                      Addie Cederberg B. [place]ang[place], [place], DPT [phone]   09/13/2015, 2:06 PM

## 2015-09-13 NOTE — Care Management Note (Signed)
Case Management Note  Patient Details  Name: Victoria Fisher MRN: [id number] Date of Birth: 25-Jul-1934  Subjective/Objective:  Patient is for dc today, she will dc with [facility] services with [facility] for [facility], PT, and Social Worker to help in assistance with getting an ALF.                    Action/Plan:   Expected Discharge Date:                  Expected Discharge Plan:  [place]  In-House Referral:     Discharge planning Services  CM Consult  Post Acute Care Choice:    Choice offered to:  Patient  DME Arranged:    DME Agency:     HH Arranged:  RN, PT, Social Work [employer] Agency:  [place]  Status of Service:  Completed, signed off  If discussed at [employer], dates discussed:    Additional Comments:  Zenon Mayo, RN 09/13/2015, 10:04 AM

## 2015-09-14 DIAGNOSIS — J410 Simple chronic bronchitis: Secondary | ICD-10-CM

## 2015-09-17 NOTE — Telephone Encounter (Signed)
Kay/ [name] called asking that a signed order be faxed to them. Fax [phone]. Phone : [phone]. A verbal was taken and they sent a fax to [place] to sign but they have not received it.

## 2015-09-18 NOTE — Telephone Encounter (Signed)
Baclofen rx printed, signed and faxed to [place].

## 2015-09-19 ENCOUNTER — Ambulatory Visit: Payer: Medicare Other | Admitting: Physical Therapy

## 2015-09-21 LAB — SULFONYLUREA HYPOGLYCEMICS PANEL, SERUM
Acetohexamide: NEGATIVE ug/mL (ref 20–60)
CHLORPROPAMIDE: NEGATIVE ug/mL (ref 75–250)
GLIPIZIDE: 52 ng/mL — AB (ref 200–1000)
Glimepiride: NEGATIVE ng/mL (ref 80–250)
Glyburide: NEGATIVE ng/mL
Nateglinide: NEGATIVE ng/mL
REPAGLINIDE: NEGATIVE ng/mL
TOLAZAMIDE: NEGATIVE ug/mL
Tolbutamide: NEGATIVE ug/mL (ref 40–100)

## 2015-09-23 ENCOUNTER — Other Ambulatory Visit: Payer: Self-pay

## 2015-09-23 ENCOUNTER — Ambulatory Visit
Admission: RE | Admit: 2015-09-23 | Discharge: 2015-09-23 | Disposition: A | Discharge status: Home / Self Care | Payer: Medicare Other | Source: Ambulatory Visit | Attending: Nephrology | Admitting: Nephrology

## 2015-09-23 DIAGNOSIS — N184 Chronic kidney disease, stage 4 (severe): Secondary | ICD-10-CM | POA: Diagnosis not present

## 2015-09-24 DIAGNOSIS — Z789 Other specified health status: Secondary | ICD-10-CM | POA: Diagnosis not present

## 2015-09-24 DIAGNOSIS — N184 Chronic kidney disease, stage 4 (severe): Secondary | ICD-10-CM | POA: Diagnosis not present

## 2015-09-24 DIAGNOSIS — I9589 Other hypotension: Secondary | ICD-10-CM | POA: Diagnosis not present

## 2015-09-24 DIAGNOSIS — D649 Anemia, unspecified: Secondary | ICD-10-CM | POA: Diagnosis not present

## 2015-09-25 DIAGNOSIS — I10 Essential (primary) hypertension: Secondary | ICD-10-CM | POA: Diagnosis not present

## 2015-10-01 ENCOUNTER — Emergency Department (HOSPITAL_COMMUNITY): Payer: Medicare Other

## 2015-10-01 ENCOUNTER — Encounter (HOSPITAL_COMMUNITY): Payer: Self-pay | Admitting: Emergency Medicine

## 2015-10-01 ENCOUNTER — Emergency Department (HOSPITAL_COMMUNITY)
Admission: EM | Admit: 2015-10-01 | Discharge: 2015-10-01 | Disposition: A | Discharge status: Home / Self Care | Payer: Medicare Other | Attending: Emergency Medicine | Admitting: Emergency Medicine

## 2015-10-01 DIAGNOSIS — M25512 Pain in left shoulder: Secondary | ICD-10-CM | POA: Diagnosis not present

## 2015-10-01 DIAGNOSIS — I5032 Chronic diastolic (congestive) heart failure: Secondary | ICD-10-CM | POA: Diagnosis not present

## 2015-10-01 DIAGNOSIS — Z85038 Personal history of other malignant neoplasm of large intestine: Secondary | ICD-10-CM | POA: Insufficient documentation

## 2015-10-01 DIAGNOSIS — M25519 Pain in unspecified shoulder: Secondary | ICD-10-CM | POA: Diagnosis not present

## 2015-10-01 DIAGNOSIS — M2450 Contracture, unspecified joint: Secondary | ICD-10-CM | POA: Diagnosis not present

## 2015-10-01 DIAGNOSIS — Z79899 Other long term (current) drug therapy: Secondary | ICD-10-CM | POA: Diagnosis not present

## 2015-10-01 DIAGNOSIS — J449 Chronic obstructive pulmonary disease, unspecified: Secondary | ICD-10-CM | POA: Diagnosis not present

## 2015-10-01 DIAGNOSIS — I2510 Atherosclerotic heart disease of native coronary artery without angina pectoris: Secondary | ICD-10-CM | POA: Diagnosis not present

## 2015-10-01 DIAGNOSIS — S80812A Abrasion, left lower leg, initial encounter: Secondary | ICD-10-CM | POA: Insufficient documentation

## 2015-10-01 DIAGNOSIS — I130 Hypertensive heart and chronic kidney disease with heart failure and stage 1 through stage 4 chronic kidney disease, or unspecified chronic kidney disease: Secondary | ICD-10-CM | POA: Insufficient documentation

## 2015-10-01 DIAGNOSIS — Y999 Unspecified external cause status: Secondary | ICD-10-CM | POA: Diagnosis not present

## 2015-10-01 DIAGNOSIS — E039 Hypothyroidism, unspecified: Secondary | ICD-10-CM | POA: Diagnosis not present

## 2015-10-01 DIAGNOSIS — S0990XA Unspecified injury of head, initial encounter: Secondary | ICD-10-CM | POA: Diagnosis not present

## 2015-10-01 DIAGNOSIS — Y929 Unspecified place or not applicable: Secondary | ICD-10-CM | POA: Insufficient documentation

## 2015-10-01 DIAGNOSIS — Z7982 Long term (current) use of aspirin: Secondary | ICD-10-CM | POA: Insufficient documentation

## 2015-10-01 DIAGNOSIS — R296 Repeated falls: Secondary | ICD-10-CM | POA: Diagnosis not present

## 2015-10-01 DIAGNOSIS — S199XXA Unspecified injury of neck, initial encounter: Secondary | ICD-10-CM | POA: Diagnosis not present

## 2015-10-01 DIAGNOSIS — I252 Old myocardial infarction: Secondary | ICD-10-CM | POA: Insufficient documentation

## 2015-10-01 DIAGNOSIS — Z91040 Latex allergy status: Secondary | ICD-10-CM | POA: Diagnosis not present

## 2015-10-01 DIAGNOSIS — J45909 Unspecified asthma, uncomplicated: Secondary | ICD-10-CM | POA: Diagnosis not present

## 2015-10-01 DIAGNOSIS — N183 Chronic kidney disease, stage 3 (moderate): Secondary | ICD-10-CM | POA: Diagnosis not present

## 2015-10-01 DIAGNOSIS — T1490 Injury, unspecified: Secondary | ICD-10-CM | POA: Diagnosis not present

## 2015-10-01 DIAGNOSIS — W1809XA Striking against other object with subsequent fall, initial encounter: Secondary | ICD-10-CM | POA: Diagnosis not present

## 2015-10-01 DIAGNOSIS — Y939 Activity, unspecified: Secondary | ICD-10-CM | POA: Diagnosis not present

## 2015-10-01 DIAGNOSIS — W19XXXA Unspecified fall, initial encounter: Secondary | ICD-10-CM

## 2015-10-01 DIAGNOSIS — T148 Other injury of unspecified body region: Secondary | ICD-10-CM | POA: Diagnosis not present

## 2015-10-01 NOTE — ED Notes (Signed)
MD at bedside. 

## 2015-10-01 NOTE — ED Triage Notes (Signed)
Per EMS, patient is from doctor's office, patient has fallen multiples today (and multiple times recently). Patient has Kyphosis. Patient is complaining of left shoulder pain. Abrasion to left leg. Patient is alert and oriented. Patient lives a [place].

## 2015-10-01 NOTE — Care Management (Signed)
ED CM consulted at [facility] concerning recommendations for [facility] services. CM reviewed patient's record, patient was recently discharged from [facility] with [facility] services. Patient is a resident at [facility]. Patient has had several falls recently as per EDP. Patient was evaluated at the [facility] ED no acute finding noted, Recommendation to resume with [facility] services. CM fax [place] resumption orders to [facility] fax confirmation received. No further CM needs identified.

## 2015-10-01 NOTE — ED Notes (Signed)
Discharge instructions and follow up care reviewed with patient. Patient verbalized understanding. 

## 2015-10-01 NOTE — ED Notes (Signed)
[facility] notified of transportation need 
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
## 2015-10-03 ENCOUNTER — Ambulatory Visit: Payer: Self-pay | Admitting: Physical Medicine & Rehabilitation

## 2015-10-03 DIAGNOSIS — Z4800 Encounter for change or removal of nonsurgical wound dressing: Secondary | ICD-10-CM | POA: Diagnosis not present

## 2015-10-03 DIAGNOSIS — Q820 Hereditary lymphedema: Secondary | ICD-10-CM | POA: Diagnosis not present

## 2015-10-09 ENCOUNTER — Ambulatory Visit: Payer: Self-pay | Admitting: Cardiovascular Disease

## 2015-10-09 ENCOUNTER — Other Ambulatory Visit (HOSPITAL_COMMUNITY): Payer: Self-pay

## 2015-10-10 ENCOUNTER — Ambulatory Visit: Payer: Medicare Other

## 2015-10-10 ENCOUNTER — Ambulatory Visit: Payer: Self-pay | Admitting: Neurology

## 2015-10-10 ENCOUNTER — Ambulatory Visit (HOSPITAL_COMMUNITY)
Admission: RE | Admit: 2015-10-10 | Discharge: 2015-10-10 | Disposition: A | Discharge status: Home / Self Care | Payer: Medicare Other | Source: Ambulatory Visit | Attending: Nephrology | Admitting: Nephrology

## 2015-10-10 DIAGNOSIS — D509 Iron deficiency anemia, unspecified: Secondary | ICD-10-CM | POA: Diagnosis not present

## 2015-10-10 DIAGNOSIS — N183 Chronic kidney disease, stage 3 unspecified: Secondary | ICD-10-CM

## 2015-10-10 LAB — POCT HEMOGLOBIN-HEMACUE: Hemoglobin: 9.8 g/dL — ABNORMAL LOW (ref 12.0–15.0)

## 2015-10-10 MED ORDER — SODIUM CHLORIDE 0.9 % IV SOLN
510.0000 mg | Freq: Once | INTRAVENOUS | Status: AC
  Administered 2015-10-10: 510 mg via INTRAVENOUS
  Filled 2015-10-10: qty 17

## 2015-10-10 MED ORDER — DARBEPOETIN ALFA 100 MCG/0.5ML IJ SOSY
PREFILLED_SYRINGE | INTRAMUSCULAR | Status: AC
  Filled 2015-10-10: qty 0.5

## 2015-10-10 MED ORDER — DARBEPOETIN ALFA 100 MCG/0.5ML IJ SOSY
100.0000 ug | PREFILLED_SYRINGE | INTRAMUSCULAR | Status: DC
  Administered 2015-10-10: 100 ug via SUBCUTANEOUS

## 2015-10-10 NOTE — Discharge Instructions (Signed)
Ferumoxytol injection °What is this medicine? °FERUMOXYTOL is an iron complex. Iron is used to make healthy red blood cells, which carry oxygen and nutrients throughout the body. This medicine is used to treat iron deficiency anemia in people with chronic kidney disease. °This medicine may be used for other purposes; ask your health care provider or pharmacist if you have questions. °What should I tell my health care provider before I take this medicine? °They need to know if you have any of these conditions: °-anemia not caused by low iron levels °-high levels of iron in the blood °-magnetic resonance imaging (MRI) test scheduled °-an unusual or allergic reaction to iron, other medicines, foods, dyes, or preservatives °-pregnant or trying to get pregnant °-breast-feeding °How should I use this medicine? °This medicine is for injection into a vein. It is given by a health care professional in a hospital or clinic setting. °Talk to your pediatrician regarding the use of this medicine in children. Special care may be needed. °Overdosage: If you think you have taken too much of this medicine contact a poison control center or emergency room at once. °NOTE: This medicine is only for you. Do not share this medicine with others. °What if I miss a dose? °It is important not to miss your dose. Call your doctor or health care professional if you are unable to keep an appointment. °What may interact with this medicine? °This medicine may interact with the following medications: °-other iron products °This list may not describe all possible interactions. Give your health care provider a list of all the medicines, herbs, non-prescription drugs, or dietary supplements you use. Also tell them if you smoke, drink alcohol, or use illegal drugs. Some items may interact with your medicine. °What should I watch for while using this medicine? °Visit your doctor or healthcare professional regularly. Tell your doctor or healthcare  professional if your symptoms do not start to get better or if they get worse. You may need blood work done while you are taking this medicine. °You may need to follow a special diet. Talk to your doctor. Foods that contain iron include: whole grains/cereals, dried fruits, beans, or peas, leafy green vegetables, and organ meats (liver, kidney). °What side effects may I notice from receiving this medicine? °Side effects that you should report to your doctor or health care professional as soon as possible: °-allergic reactions like skin rash, itching or hives, swelling of the face, lips, or tongue °-breathing problems °-changes in blood pressure °-feeling faint or lightheaded, falls °-fever or chills °-flushing, sweating, or hot feelings °-swelling of the ankles or feet °Side effects that usually do not require medical attention (Report these to your doctor or health care professional if they continue or are bothersome.): °-diarrhea °-headache °-nausea, vomiting °-stomach pain °This list may not describe all possible side effects. Call your doctor for medical advice about side effects. You may report side effects to FDA at 1-800-FDA-1088. °Where should I keep my medicine? °This drug is given in a hospital or clinic and will not be stored at home. °NOTE: This sheet is a summary. It may not cover all possible information. If you have questions about this medicine, talk to your doctor, pharmacist, or health care provider. °  °© 2016, [employer]. (2011-09-18 15:23:36) °Darbepoetin Alfa injection °What is this medicine? °DARBEPOETIN ALFA (dar be POE e tin AL fa) helps your body make more red blood cells. It is used to treat anemia caused by chronic kidney failure and chemotherapy. °  This medicine may be used for other purposes; ask your health care provider or pharmacist if you have questions. °What should I tell my health care provider before I take this medicine? °They need to know if you have any of these  conditions: °-blood clotting disorders or history of blood clots °-cancer patient not on chemotherapy °-cystic fibrosis °-heart disease, such as angina, heart failure, or a history of a heart attack °-hemoglobin level of 12 g/dL or greater °-high blood pressure °-low levels of folate, iron, or vitamin B12 °-seizures °-an unusual or allergic reaction to darbepoetin, erythropoietin, albumin, hamster proteins, latex, other medicines, foods, dyes, or preservatives °-pregnant or trying to get pregnant °-breast-feeding °How should I use this medicine? °This medicine is for injection into a vein or under the skin. It is usually given by a health care professional in a hospital or clinic setting. °If you get this medicine at home, you will be taught how to prepare and give this medicine. Do not shake the solution before you withdraw a dose. Use exactly as directed. Take your medicine at regular intervals. Do not take your medicine more often than directed. °It is important that you put your used needles and syringes in a special sharps container. Do not put them in a trash can. If you do not have a sharps container, call your pharmacist or healthcare provider to get one. °Talk to your pediatrician regarding the use of this medicine in children. While this medicine may be used in children as young as 1 year for selected conditions, precautions do apply. °Overdosage: If you think you have taken too much of this medicine contact a poison control center or emergency room at once. °NOTE: This medicine is only for you. Do not share this medicine with others. °What if I miss a dose? °If you miss a dose, take it as soon as you can. If it is almost time for your next dose, take only that dose. Do not take double or extra doses. °What may interact with this medicine? °Do not take this medicine with any of the following medications: °-epoetin alfa °This list may not describe all possible interactions. Give your health care provider a  list of all the medicines, herbs, non-prescription drugs, or dietary supplements you use. Also tell them if you smoke, drink alcohol, or use illegal drugs. Some items may interact with your medicine. °What should I watch for while using this medicine? °Visit your prescriber or health care professional for regular checks on your progress and for the needed blood tests and blood pressure measurements. It is especially important for the doctor to make sure your hemoglobin level is in the desired range, to limit the risk of potential side effects and to give you the best benefit. Keep all appointments for any recommended tests. Check your blood pressure as directed. Ask your doctor what your blood pressure should be and when you should contact him or her. °As your body makes more red blood cells, you may need to take iron, folic acid, or vitamin B supplements. Ask your doctor or health care provider which products are right for you. If you have kidney disease continue dietary restrictions, even though this medication can make you feel better. Talk with your doctor or health care professional about the foods you eat and the vitamins that you take. °What side effects may I notice from receiving this medicine? °Side effects that you should report to your doctor or health care professional as soon as possible: °-  allergic reactions like skin rash, itching or hives, swelling of the face, lips, or tongue °-breathing problems °-changes in vision °-chest pain °-confusion, trouble speaking or understanding °-feeling faint or lightheaded, falls °-high blood pressure °-muscle aches or pains °-pain, swelling, warmth in the leg °-rapid weight gain °-severe headaches °-sudden numbness or weakness of the face, arm or leg °-trouble walking, dizziness, loss of balance or coordination °-seizures (convulsions) °-swelling of the ankles, feet, hands °-unusually weak or tired °Side effects that usually do not require medical attention (report  to your doctor or health care professional if they continue or are bothersome): °-diarrhea °-fever, chills (flu-like symptoms) °-headaches °-nausea, vomiting °-redness, stinging, or swelling at site where injected °This list may not describe all possible side effects. Call your doctor for medical advice about side effects. You may report side effects to FDA at 1-800-FDA-1088. °Where should I keep my medicine? °Keep out of the reach of children. °Store in a refrigerator between 2 and 8 degrees C (36 and 46 degrees F). Do not freeze. Do not shake. Throw away any unused portion if using a single-dose vial. Throw away any unused medicine after the expiration date. °NOTE: This sheet is a summary. It may not cover all possible information. If you have questions about this medicine, talk to your doctor, pharmacist, or health care provider. °  °© 2016, [employer]vier/[employer] Standard. (2008-01-17 10:23:57) ° °

## 2015-10-15 ENCOUNTER — Telehealth: Payer: Self-pay | Admitting: Neurology

## 2015-10-15 NOTE — Telephone Encounter (Signed)
Pt called in complaining that she is very dizzy , falling (x 4), knees and legs so swollen she can hardly walk. Dizzy when she changes position, sit to standing. She has hit her head most recently this morning, did not pass out from hitting head. She says just about every time she fell she hit her head. She may have fallen more than she can remember. [phone]

## 2015-10-15 NOTE — Telephone Encounter (Signed)
Spoke to patient - she has been worked into Dr Jannifer Franklin' schedule this week.

## 2015-10-15 NOTE — Telephone Encounter (Signed)
I called patient. The patient has had increased dizziness, falling, we will need a revisit to evaluate this issue.

## 2015-10-15 NOTE — Telephone Encounter (Signed)
Left message for patient to return call.  Need to offer her an earlier appt.

## 2015-10-16 ENCOUNTER — Encounter: Payer: Self-pay | Admitting: Nephrology

## 2015-10-16 DIAGNOSIS — N189 Chronic kidney disease, unspecified: Secondary | ICD-10-CM

## 2015-10-16 DIAGNOSIS — D631 Anemia in chronic kidney disease: Secondary | ICD-10-CM | POA: Insufficient documentation

## 2015-10-16 DIAGNOSIS — R42 Dizziness and giddiness: Secondary | ICD-10-CM | POA: Diagnosis not present

## 2015-10-16 DIAGNOSIS — R404 Transient alteration of awareness: Secondary | ICD-10-CM | POA: Diagnosis not present

## 2015-10-17 ENCOUNTER — Telehealth: Payer: Self-pay

## 2015-10-17 ENCOUNTER — Encounter: Payer: Self-pay | Admitting: Cardiology

## 2015-10-17 ENCOUNTER — Telehealth: Payer: Self-pay | Admitting: Neurology

## 2015-10-17 ENCOUNTER — Ambulatory Visit (INDEPENDENT_AMBULATORY_CARE_PROVIDER_SITE_OTHER): Payer: Medicare Other | Admitting: Cardiology

## 2015-10-17 ENCOUNTER — Ambulatory Visit (INDEPENDENT_AMBULATORY_CARE_PROVIDER_SITE_OTHER): Payer: Medicare Other | Admitting: Neurology

## 2015-10-17 ENCOUNTER — Encounter: Payer: Self-pay | Admitting: Neurology

## 2015-10-17 VITALS — BP 126/84 | HR 81 | Ht 64.0 in | Wt 169.6 lb

## 2015-10-17 VITALS — BP 133/78 | HR 78 | Ht 64.0 in | Wt 170.0 lb

## 2015-10-17 DIAGNOSIS — I2781 Cor pulmonale (chronic): Secondary | ICD-10-CM | POA: Diagnosis not present

## 2015-10-17 DIAGNOSIS — I2510 Atherosclerotic heart disease of native coronary artery without angina pectoris: Secondary | ICD-10-CM

## 2015-10-17 DIAGNOSIS — R296 Repeated falls: Secondary | ICD-10-CM | POA: Diagnosis not present

## 2015-10-17 DIAGNOSIS — Z5181 Encounter for therapeutic drug level monitoring: Secondary | ICD-10-CM

## 2015-10-17 DIAGNOSIS — I482 Chronic atrial fibrillation, unspecified: Secondary | ICD-10-CM

## 2015-10-17 DIAGNOSIS — I10 Essential (primary) hypertension: Secondary | ICD-10-CM

## 2015-10-17 DIAGNOSIS — E785 Hyperlipidemia, unspecified: Secondary | ICD-10-CM

## 2015-10-17 DIAGNOSIS — G4733 Obstructive sleep apnea (adult) (pediatric): Secondary | ICD-10-CM

## 2015-10-17 DIAGNOSIS — R600 Localized edema: Secondary | ICD-10-CM | POA: Diagnosis not present

## 2015-10-17 MED ORDER — FUROSEMIDE 40 MG PO TABS: 40.0000 mg | ORAL_TABLET | Freq: Every day | ORAL | 3 refills | Status: DC

## 2015-10-17 MED ORDER — CEPHALEXIN 500 MG PO CAPS: 500.0000 mg | ORAL_CAPSULE | Freq: Two times a day (BID) | ORAL | 0 refills | Status: DC

## 2015-10-17 NOTE — Telephone Encounter (Signed)
Pt no-showed today's work-in appt.

## 2015-10-17 NOTE — Progress Notes (Signed)
Reason for visit: Gait disorder, falls  Victoria Fisher is an 81 y.o. 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 The patient has had significant swelling in the legs dating back quite a number of years. The patient was recently in the hospital on 09/09/2015 with an episode of hypoglycemia. Around that time, the patient was taken off of her diuretic. She had her morphine dose reduced, and she was placed on gabapentin taking 300 mg in the evening hours. According to the patient, after starting the gabapentin she began having increasing problems with swelling to the point where even her face is markedly swollen, she has difficulty seeing because her eyelids are blocking her vision. The patient has had onset of a sensation of dizziness and imbalance, the patient is falling frequently, even with a walker. She reports no new numbness or weakness of the extremities. She has chronic renal insufficiency, she will be seen through nephrology in the near future with Dr. Lorrene Reid. She is sent to this office for evaluation on an urgent basis for the walking problems. She has undergone a recent CT scan of the brain that shows no acute changes. Minimal small vessel disease was noted.  Past Medical History:  Diagnosis Date  . Abnormality of gait 01/30/2015  . Anxiety   . Arthritis   . Asthma    On nebulizer  . Atrial fibrillation, chronic (HCC)    On Coumadin & a beta-blocker  . Bowel obstruction (HCC)    Caused by scar tissue  . CHF (congestive heart failure) (HCC)    Acute on chronic, diastolic, improved. Predominantly right heart failure due to cor pulmonale & in turn this is most likely due to untreated OSA.  [name] Chronic anticoagulation    A fib. On Coumadin.  . Chronic cor pulmonale (HCC)    Most likely due to COPD (cannot rule out contribution of diastolic LV dysfunction). ECHO 07/30/11 pulmonary  artery pressure was estimated to be 50-60% mmHg & there was moderate tricuspid insufficiency.  . Chronic pain syndrome   . CKD (chronic kidney disease), stage III   . Colon cancer ([place])    Carcinoma of sigmoid colon 1984 & 1985.  [name] COPD (chronic obstructive pulmonary disease) ([place])   . Coronary artery disease   . Coronary atherosclerosis    Mild. Myoview 07/2011 EF = >55%. RV was moderately dilated. LA was severely dilated. Catheterization 02/12/03 Showed a tiny old occlusion of the optional diagonal branch & also proximal LAD.  [name] Depression   . Diverticulosis of colon   . 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 Dyspnea on exertion    Reluctant to take diuretics  . Edema of both legs    Venous Doppler 05/11/12 was normal & no evidence of thrombus or thrombophlebitis. Chronic LE edema related to both cor pulmonale & lymphedema, hyperlipidemia & HTN involving coronary atherosclerosis by cardiac cath in 2004. Cath showed a tiny old occlusion of the optional diagonal branch & also proximal LAD.  [name] Emphysema of lung ([place])   . Frequent falls    Possibly due to orthostatic hypotension but we have not seen that on our visits. Recent fall 05/2012 with scalp hematoma, swollen left LE, & black eyes.  . Hyperlipidemia    On a statin. Catheterization 02/12/03 Showed a tiny old occlusion of the optional diagonal branch & also proximal LAD.  [name] Hypertension  Myoview 07/2011 EF = >55%. RV was moderately dilated. LA was severely dilated. Catheterization 02/12/03 Showed a tiny old occlusion of the optional diagonal branch & also proximal LAD.  [name] Hypothyroidism 2002   On hormone replacement therapy  . IBS (irritable bowel syndrome)   . Insomnia   . Kidney failure 09/05/2015   Pt states that she went to the doctor on thursday and was told she is in stage 4 kidney failure  . Lung nodule   . MI (myocardial infarction) ([place]) 1999   Unstable angina  . Milroy's disease    Lymphatic disorder diagnosed at [facility]  .  Obstructive sleep apnea    Polysomnogram 09/25/11 AHI: 53.6/hr overall & 57.1/hr during REM sleep. Poor compliance previously with CPAP  . Pulmonary hypertension ([place])    Pullmonary hypertension, chronic cor pulmonale w/pulmonary artery pressures of 50&#x2011; 60 mmHg.  . Tuberculosis     Past Surgical History:  Procedure Laterality Date  . ABDOMINAL HYSTERECTOMY  1983  . APPENDECTOMY  1956  . CARDIAC CATHETERIZATION  02/12/03   Showed a tiny old occlusion of the optional diagonal branch & also proximal LAD.  [name] CHOLECYSTECTOMY  1984  . COLON BIOPSY    . COLON RESECTION     2 times  . COLONOSCOPY  07/23/10    Family History  Problem Relation Age of Onset  . Heart disease Father   . Alzheimer's disease Mother   . Parkinsonism Brother   . Diabetes Brother     Social history:  reports that she has never smoked. She has never used smokeless tobacco. She reports that she does not drink alcohol or use drugs.    Allergies  Allergen Reactions  . Codeine Anaphylaxis, Hives and Nausea And Vomiting  . Ciprofloxacin Hives and Nausea And Vomiting  . Latex Hives and Rash  . Penicillins Hives    Has patient had a PCN reaction causing immediate rash, facial/tongue/throat swelling, SOB or lightheadedness with hypotension: Yes Has patient had a PCN reaction causing severe rash involving mucus membranes or skin necrosis: Yes Has patient had a PCN reaction that required hospitalization Yes Has patient had a PCN reaction occurring within the last 10 years: Yes If all of the above answers are "NO", then may proceed with Cephalosporin use.  . Sulfonamide Derivatives Hives and Rash    Medications:  Prior to Admission medications   Medication Sig Start Date End Date Taking? Authorizing Provider  aspirin EC 81 MG tablet Take 81 mg by mouth every morning.    Yes Historical Provider, MD  atorvastatin (LIPITOR) 40 MG tablet Take 40 mg by mouth every evening.    Yes Historical Provider, MD  baclofen  (LIORESAL) 10 MG tablet Take 0.5 tablets (5 mg total) by mouth 2 (two) times daily. 08/29/15  Yes Kathrynn Ducking, MD  cholecalciferol (VITAMIN D) 1000 UNITS tablet Take 1,000 Units by mouth daily with breakfast.    Yes Historical Provider, MD  ferrous sulfate 325 (65 FE) MG tablet Take 325 mg by mouth daily.   Yes Historical Provider, MD  gabapentin (NEURONTIN) 600 MG tablet Take 0.5 tablets (300 mg total) by mouth at bedtime. 09/13/15  Yes Allie Bossier, MD  levalbuterol [facility] HFA) 45 MCG/ACT inhaler Inhale 1 puff into the lungs every 4 (four) hours as needed for wheezing.   Yes Historical Provider, MD  levothyroxine (SYNTHROID, LEVOTHROID) 112 MCG tablet Take 1 tablet (112 mcg total) by mouth every morning. Patient taking differently: Take 112 mcg by  mouth daily before breakfast.  06/30/13  Yes Mihai Croitoru, MD  magnesium oxide (MAG-OX) 400 (241.3 MG) MG tablet Take 1 tablet (400 mg total) by mouth daily. 07/11/13  Yes Haywood Pao, MD  morphine (MS CONTIN) 30 MG 12 hr tablet Take 1 tablet (30 mg total) by mouth 2 (two) times daily. 09/13/15  Yes Allie Bossier, MD  morphine (MSIR) 15 MG tablet Take 1 tablet (15 mg total) by mouth 3 (three) times daily as needed (breakthrough pain). 09/13/15  Yes Allie Bossier, MD  nitroGLYCERIN (NITROLINGUAL) 0.4 MG/SPRAY spray Place 1 spray under the tongue every 5 (five) minutes as needed for chest pain. 12/13/12  Yes Mihai Croitoru, MD  sennosides-docusate sodium (SENOKOT-S) 8.6-50 MG tablet Take 2-3 tablets by mouth daily as needed for constipation.   Yes Historical Provider, MD  sertraline (ZOLOFT) 100 MG tablet Take 100 mg by mouth daily with breakfast.    Yes Historical Provider, MD  triamcinolone cream (KENALOG) 0.1 % Apply 1 application topically daily as needed (eczema).  07/30/15  Yes Historical Provider, MD    ROS:  Out of a complete 14 system review of symptoms, the patient complains only of the following symptoms, and all other reviewed  systems are negative.  Increased weight Gait disturbance, falls Swelling  Blood pressure 133/78, pulse 78, height 5\' 4"  (1.626 m), weight 170 lb (77.1 kg).   Blood pressure, right arm, standing is 126/84.  Physical Exam  General: The patient is alert and cooperative at the time of the examination.  Skin: 4+ edema is noted in both legs, well into the mid to upper thighs. The patient has severe swelling of the face, left greater than right. The neck is in flexion, head is slightly rotated to the right.   Neurologic Exam  Mental status: The patient is alert and oriented x 3 at the time of the examination. The patient has apparent normal recent and remote memory, with an apparently normal attention span and concentration ability.   Cranial nerves: Facial symmetry is present. Speech is normal, no aphasia or dysarthria is noted. Extraocular movements are full. Visual fields are full.  Motor: The patient has good strength in all 4 extremities.  Sensory examination: Soft touch sensation is symmetric on the face, arms, and legs.  Coordination: The patient has good finger-nose-finger and heel-to-shin bilaterally.  Gait and station: The patient has a slightly wide-based gait. The patient walks with a walker. Romberg is negative. When trying to walk by herself, the patient is unsteady, she has a tendency to stumble.  Reflexes: Deep tendon reflexes are symmetric, but are depressed.   CT brain and cervical 10/01/15:  IMPRESSION: 1. No evidence of traumatic intracranial injury or fracture 2. No evidence of acute fracture or subluxation along the cervical spine. 3. Mild soft tissue swelling overlying the left frontal calvarium. 4. Mild cortical volume loss and scattered small vessel ischemic microangiopathy. 5. Mild degenerative change along the cervical spine, with grade 2 anterolisthesis of C3 on C4. 6. Dilatation of the proximal esophagus raises concern for some degree of achalasia.  Would correlate for associated symptoms.     Assessment/Plan:  1. Cervical dystonia  2. Gait disorder, multiple falls  3. Severe worsening peripheral edema  4. Chronic pain syndrome  The patient indicates that since she has been placed on gabapentin she has had significant worsening of peripheral edema, she also has been taken off of a diuretic. The patient reports worsening of her walking and balance on  the gabapentin. The gabapentin will be discontinued. She will be set up for physical therapy in the home environment. The patient will be sent for blood work today to reevaluate her renal function. The edema is very severe, the patient may require diuretic therapy in the near future.  Jill Alexanders MD 10/17/2015 12:51 PM  [facility] [address] [place], [place] [address]  Phone [phone] Fax [phone]

## 2015-10-17 NOTE — Telephone Encounter (Signed)
OV notes and MD order to stop the gabapentin faxed to [facility], RN, as requested.

## 2015-10-17 NOTE — Telephone Encounter (Signed)
Victoria Fisher, [place] called after being notified by pts caregiver a medication was d/c'd. They need a faxed order of d/c'd medication . FaxJC:[phone]. Phone: [phone]

## 2015-10-17 NOTE — Telephone Encounter (Signed)
Margaret/[name] called on behalf of the pt to find out if she had be prescribed any pain medication. If she does not , the pt is requesting something for pain. Does she need to wait till after blood work. Please call the pt per Joycelyn Schmid. If something is prescribed it needs to be sent to [facility] with an order - [phone]

## 2015-10-17 NOTE — Patient Instructions (Addendum)
Stop the gabapentin.   Fall Prevention in the Home  Falls can cause injuries and can affect people from all age groups. There are many simple things that you can do to make your home safe and to help prevent falls. WHAT CAN I DO ON THE OUTSIDE OF MY HOME?  Regularly repair the edges of walkways and driveways and fix any cracks.  Remove high doorway thresholds.  Trim any shrubbery on the main path into your home.  Use bright outdoor lighting.  Clear walkways of debris and clutter, including tools and rocks.  Regularly check that handrails are securely fastened and in good repair. Both sides of any steps should have handrails.  Install guardrails along the edges of any raised decks or porches.  Have leaves, snow, and ice cleared regularly.  Use sand or salt on walkways during winter months.  In the garage, clean up any spills right away, including grease or oil spills. WHAT CAN I DO IN THE BATHROOM?  Use night lights.  Install grab bars by the toilet and in the tub and shower. Do not use towel bars as grab bars.  Use non-skid mats or decals on the floor of the tub or shower.  If you need to sit down while you are in the shower, use a plastic, non-slip stool.[name]  Keep the floor dry. Immediately clean up any water that spills on the floor.  Remove soap buildup in the tub or shower on a regular basis.  Attach bath mats securely with double-sided non-slip rug tape.  Remove throw rugs and other tripping hazards from the floor. WHAT CAN I DO IN THE BEDROOM?  Use night lights.  Make sure that a bedside light is easy to reach.  Do not use oversized bedding that drapes onto the floor.  Have a firm chair that has side arms to use for getting dressed.  Remove throw rugs and other tripping hazards from the floor. WHAT CAN I DO IN THE KITCHEN?   Clean up any spills right away.  Avoid walking on wet floors.  Place frequently used items in easy-to-reach places.  If you  need to reach for something above you, use a sturdy step stool that has a grab bar.  Keep electrical cables out of the way.  Do not use floor polish or wax that makes floors slippery. If you have to use wax, make sure that it is non-skid floor wax.  Remove throw rugs and other tripping hazards from the floor. WHAT CAN I DO IN THE STAIRWAYS?  Do not leave any items on the stairs.  Make sure that there are handrails on both sides of the stairs. Fix handrails that are broken or loose. Make sure that handrails are as long as the stairways.  Check any carpeting to make sure that it is firmly attached to the stairs. Fix any carpet that is loose or worn.  Avoid having throw rugs at the top or bottom of stairways, or secure the rugs with carpet tape to prevent them from moving.  Make sure that you have a light switch at the top of the stairs and the bottom of the stairs. If you do not have them, have them installed. WHAT ARE SOME OTHER FALL PREVENTION TIPS?  Wear closed-toe shoes that fit well and support your feet. Wear shoes that have rubber soles or low heels.  When you use a stepladder, make sure that it is completely opened and that the sides are firmly locked. Have  someone hold the ladder while you are using it. Do not climb a closed stepladder.  Add color or contrast paint or tape to grab bars and handrails in your home. Place contrasting color strips on the first and last steps.  Use mobility aids as needed, such as canes, walkers, scooters, and crutches.  Turn on lights if it is dark. Replace any light bulbs that burn out.  Set up furniture so that there are clear paths. Keep the furniture in the same spot.  Fix any uneven floor surfaces.  Choose a carpet design that does not hide the edge of steps of a stairway.  Be aware of any and all pets.  Review your medicines with your healthcare provider. Some medicines can cause dizziness or changes in blood pressure, which increase  your risk of falling. Talk with your health care provider about other ways that you can decrease your risk of falls. This may include working with a physical therapist or trainer to improve your strength, balance, and endurance.   This information is not intended to replace advice given to you by your health care provider. Make sure you discuss any questions you have with your health care provider.   Document Released: 01/23/2002 Document Revised: 06/19/2014 Document Reviewed: 03/09/2014 Elsevier Interactive Patient Education [employer].

## 2015-10-17 NOTE — Telephone Encounter (Signed)
Attempted to fax order to d/c gabapentin multiple times but w/o success. Pt/[name] staff should have also received after-visit summary from Dr. Jannifer Franklin when seen in the office earlier today. AVS has printed instructions to d/c gabapentin as well. Returned call to [facility] Left mssg w/ verbal order to d/c gabapentin and to verify fax #. Also, pt has MSContin and Morphine IR currently ordered and should continue meds as prescribed for pain. May call back w/ further questions/concerns.

## 2015-10-17 NOTE — Telephone Encounter (Signed)
Pt did arrive 20 minutes after appt time.

## 2015-10-17 NOTE — Progress Notes (Signed)
Cardiology Office Note    Date:  10/17/2015   ID:  RINAD CONAWAY, DOB 10-21-1934, MRN [id number]  PCP:  Haywood Pao, MD  Cardiologist:  Dr. Sallyanne Kuster  Chief Complaint: 12 Months follow up (overdue)  History of Present Illness:   Victoria Fisher is a 81 y.o. female CAD, COPD, CHF, CKD stage 3, chronic A. Fib (not on anticoagulation due to multiple fall), HLD, HTN, frequent fall due to gait instability, pulmonary hypertension and cervical dystonia who presented for follow-up.  She has mild coronary atherosclerosis by remote cardiac catheterization 2004 which showed old occlusion of optimal diagonal branch of the proximal LAD.  Last echo 05/2013 showed LV EF of 55-60%, no WM abnormality but her right ventricle is moderately to severely dilated and her left atrium is severely dilated. Mild to moderate MR, moderate to severe TR and PA pressure of 71mm Hg.  Her pulmonary hypertension which is the substrate for her right heart failure. The only identifiable etiology is sleep apnea. No using CPAP. Last seen by Dr. Sallyanne Kuster 05/2013.  The patient was admitted 09/09/15-09/13/15 with a acute encephalopathy and syncope. EMS reported CBG of 50. He was felt most likely multifactorial to include taking her husband's medication by mistake, iatrogenic Hypotension, narcotic overuse. Resolving suspected hematoma to left sternocleidomastoid muscle due to multiple fall. Discontinued diuretics with improved  Scr to 1.38 at discharge from 1.83 at admission.   Again seen in ER 10/01/2015 after mechanical fall and discharged with neurology follow-up.  The patient was seen by Dr. Jannifer Franklin (Neurologist) today. Stoped Gabapentin due to worsening peripheral edema--> recommended home PT and BMET done. Here for follow up.  She has been having worsening edema with weeping. Also having orthopnea and abdominal fullness. Feels dizzy before fall. No chest pain, palpitations or melena.    Past Medical History:  Diagnosis  Date  . Abnormality of gait 01/30/2015  . Anxiety   . Arthritis   . Asthma    On nebulizer  . Atrial fibrillation, chronic (HCC)    On Coumadin & a beta-blocker  . Bowel obstruction (HCC)    Caused by scar tissue  . CHF (congestive heart failure) (HCC)    Acute on chronic, diastolic, improved. Predominantly right heart failure due to cor pulmonale & in turn this is most likely due to untreated OSA.  [name] Chronic anticoagulation    A fib. On Coumadin.  . Chronic cor pulmonale (HCC)    Most likely due to COPD (cannot rule out contribution of diastolic LV dysfunction). ECHO 07/30/11 pulmonary artery pressure was estimated to be 50-60% mmHg & there was moderate tricuspid insufficiency.  . Chronic pain syndrome   . CKD (chronic kidney disease), stage III   . Colon cancer ([place])    Carcinoma of sigmoid colon 1984 & 1985.  [name] COPD (chronic obstructive pulmonary disease) ([place])   . Coronary artery disease    occluded proximal diagonal artery on angiography 2004  . Coronary atherosclerosis    Mild. Myoview 07/2011 EF = >55%. RV was moderately dilated. LA was severely dilated. Catheterization 02/12/03 Showed a tiny old occlusion of the optional diagonal branch & also proximal LAD.  [name] Depression   . Diverticulosis of colon   . 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 Dyspnea on exertion    Reluctant to take diuretics  . Edema of both legs    Venous Doppler 05/11/12 was normal & no evidence of thrombus or thrombophlebitis. Chronic LE edema related to both cor pulmonale &  lymphedema, hyperlipidemia & HTN involving coronary atherosclerosis by cardiac cath in 2004. Cath showed a tiny old occlusion of the optional diagonal branch & also proximal LAD.  [name] Emphysema of lung ([place])   . Frequent falls    Possibly due to orthostatic hypotension but we have not seen that on our visits. Recent fall 05/2012 with scalp hematoma, swollen left LE, & black eyes.  . Hyperlipidemia    On a statin. Catheterization 02/12/03  Showed a tiny old occlusion of the optional diagonal branch & also proximal LAD.  [name] Hypertension    Myoview 07/2011 EF = >55%. RV was moderately dilated. LA was severely dilated. Catheterization 02/12/03 Showed a tiny old occlusion of the optional diagonal branch & also proximal LAD.  [name] Hypothyroidism 2002   On hormone replacement therapy  . IBS (irritable bowel syndrome)   . Insomnia   . Kidney failure 09/05/2015   Pt states that she went to the doctor on thursday and was told she is in stage 4 kidney failure  . Lung nodule   . MI (myocardial infarction) ([place]) 1999   Unstable angina  . Milroy's disease    Lymphatic disorder diagnosed at [facility]  . Obstructive sleep apnea    Polysomnogram 09/25/11 AHI: 53.6/hr overall & 57.1/hr during REM sleep. Poor compliance previously with CPAP  . Pulmonary hypertension ([place])    Pullmonary hypertension, chronic cor pulmonale w/pulmonary artery pressures of 50&#x2011; 60 mmHg.  . Tuberculosis     Past Surgical History:  Procedure Laterality Date  . ABDOMINAL HYSTERECTOMY  1983  . APPENDECTOMY  1956  . CARDIAC CATHETERIZATION  02/12/03   Showed a tiny old occlusion of the optional diagonal branch & also proximal LAD.  [name] CHOLECYSTECTOMY  1984  . COLON BIOPSY    . COLON RESECTION     2 times  . COLONOSCOPY  07/23/10   Prior to Admission medications   Medication Sig Start Date End Date Taking? Authorizing Provider  aspirin EC 81 MG tablet Take 81 mg by mouth every morning.    Yes Historical Provider, MD  atorvastatin (LIPITOR) 40 MG tablet Take 40 mg by mouth every evening.    Yes Historical Provider, MD  baclofen (LIORESAL) 10 MG tablet Take 0.5 tablets (5 mg total) by mouth 2 (two) times daily. 08/29/15  Yes Kathrynn Ducking, MD  cholecalciferol (VITAMIN D) 1000 UNITS tablet Take 2,000 Units by mouth daily with breakfast.    Yes Historical Provider, MD  docusate sodium (COLACE) 100 MG capsule Take 100 mg by mouth 2 (two) times daily.   Yes Historical  Provider, MD  ferrous sulfate 325 (65 FE) MG tablet Take 325 mg by mouth daily.   Yes Historical Provider, MD  levalbuterol (XOPENEX HFA) 45 MCG/ACT inhaler Inhale 1 puff into the lungs every 4 (four) hours as needed for wheezing.   Yes Historical Provider, MD  levothyroxine (SYNTHROID, LEVOTHROID) 112 MCG tablet Take 1 tablet (112 mcg total) by mouth every morning. Patient taking differently: Take 112 mcg by mouth daily before breakfast.  06/30/13  Yes Mihai Croitoru, MD  magnesium oxide (MAG-OX) 400 (241.3 MG) MG tablet Take 1 tablet (400 mg total) by mouth daily. 07/11/13  Yes Haywood Pao, MD  metoprolol succinate (TOPROL-XL) 50 MG 24 hr tablet TK 1 T PO QD 08/12/15  Yes Historical Provider, MD  morphine (MS CONTIN) 30 MG 12 hr tablet Take 1 tablet (30 mg total) by mouth 2 (two) times daily. 09/13/15  Yes Geraldo Docker  Sherral Hammers, MD  morphine (MSIR) 15 MG tablet Take 1 tablet (15 mg total) by mouth 3 (three) times daily as needed (breakthrough pain). 09/13/15  Yes Allie Bossier, MD  nitroGLYCERIN (NITROLINGUAL) 0.4 MG/SPRAY spray Place 1 spray under the tongue every 5 (five) minutes as needed for chest pain. 12/13/12  Yes Mihai Croitoru, MD  polyethylene glycol (MIRALAX / GLYCOLAX) packet Take 17 g by mouth 2 (two) times daily as needed.   Yes Historical Provider, MD  sennosides-docusate sodium (SENOKOT-S) 8.6-50 MG tablet Take 2-3 tablets by mouth daily as needed for constipation.   Yes Historical Provider, MD  sertraline (ZOLOFT) 100 MG tablet TK 1 T PO D 09/20/15  Yes Historical Provider, MD      Allergies:   Codeine; Ciprofloxacin; Latex; Penicillins; and Sulfonamide derivatives   Social History   Social History  . Marital status: Married    Spouse name: Georgiann Mccoy  . Number of children: 3  . Years of education: HS   Occupational History  .  Retired   Social History Main Topics  . Smoking status: Never Smoker  . Smokeless tobacco: Never Used  . Alcohol use No  . Drug use: No  . Sexual  activity: No   Other Topics Concern  . Not on file   Social History Narrative   Patient is married Georgiann Mccoy) and lives at home with her husband.   Patient is retired.   Patient has three children and her husband has two children.   Patient is right-handed.   Patient drinks very little caffeine (1/2 cup daily)   Patient has a high school education.     Family History:  The patient's family history includes Alzheimer's disease in her mother; Diabetes in her brother; Heart disease in her father; Parkinsonism in her brother.   ROS:   Please see the history of present illness.    ROS All other systems reviewed and are negative.   PHYSICAL EXAM:   VS:  BP 126/84   Pulse 81   Ht 5\' 4"  (1.626 m)   Wt 169 lb 9.6 oz (76.9 kg)   BMI 29.11 kg/m    GEN: obese female in no acute distress  HEENT: normal  Neck: + JVD. No carotid bruits, or masses Cardiac: ir ir ; no murmurs, rubs, or gallops, 3+ BL LE edema up to upper thigh. L leg wrapped with ACE. Right leg with weeping edema with redness, minimal warmth.  Respiratory:  clear to auscultation bilaterally, normal work of breathing GI: soft, nontender, nondistended, + BS MS: no deformity or atrophy  Skin: warm and dry, no rash Neuro:  Alert and Oriented x 3, Strength and sensation are intact Psych: euthymic mood, full affect  Wt Readings from Last 3 Encounters:  10/17/15 169 lb 9.6 oz (76.9 kg)  10/17/15 170 lb (77.1 kg)  10/10/15 160 lb (72.6 kg)      Studies/Labs Reviewed:   EKG:  EKG is ordered today.  Afib at rate of 81 bpm. No acute changes.   Recent Labs: 09/09/2015: B Natriuretic Peptide 275.1 09/10/2015: TSH 1.087 09/11/2015: ALT 11; Platelets 131 09/12/2015: BUN 40; Creatinine, Ser 1.38; Magnesium 2.4; Potassium 3.6; Sodium 141 10/10/2015: Hemoglobin 9.8   Lipid Panel    Component Value Date/Time   CHOL 59 09/10/2015 0403   TRIG 22 09/10/2015 0403   HDL 32 (L) 09/10/2015 0403   CHOLHDL 1.8 09/10/2015 0403   VLDL 4  09/10/2015 0403   LDLCALC 23 09/10/2015 0403  Additional studies/ records that were reviewed today include:   Echocardiogram: 06/08/13 LV EF: 55% -  60%  ------------------------------------------------------------ Indications:   CHF - 428.0.  ------------------------------------------------------------ History:  PMH: Pulmonary Hypertension. Atrial fibrillation. Coronary artery disease. Congestive heart failure. Chronic obstructive pulmonary disease. PMH: Myocardial infarction. Risk factors: Hypertension.  ------------------------------------------------------------ Study Conclusions  - Left ventricle: The cavity size was normal. Systolic function was normal. The estimated ejection fraction was in the range of 55% to 60%. Wall motion was normal; there were no regional wall motion abnormalities. - Ventricular septum: The contour showed systolic flattening. These changes are consistent with RV pressure overload. - Mitral valve: Mild to moderate regurgitation directed posteriorly. - Left atrium: The atrium was severely dilated. - Right ventricle: The cavity size was moderately to severely dilated. Wall thickness was increased. Systolic function was reduced. - Right atrium: The atrium was massively dilated. - Tricuspid valve: Moderate-severe regurgitation. - Pulmonary arteries: Systolic pressure was mildly to moderately increased. PA peak pressure: 76mm Hg (S).  Cardiac Catheterization: 01/2003  ANGIOGRAPHIC RESULTS:  1. The left main coronary artery was normal.  2. The left anterior descending coronary artery courses to the cardiac apex     and is normal.  The major diagonal branch arising along the anterolateral     wall is normal.  There is a tiny stump of a first diagonal branch arising     as an optional diagonal in the very proximal portion of the LAD.  This is     the site of an old occluded diagonal.  3. The left circumflex gives rise  to a bifurcating second obtuse marginal     branch and a very small normal first obtuse marginal.  The mid and distal     circumflex beyond the second obtuse marginal branch is normal.  4. The right coronary artery is a dominant vessel and is normal.  5. The left ventricular angiogram shows a left ventricular ejection fraction     of 50% with no mitral regurgitation and no wall motion abnormalities.  6. Abdominal aorta is normal as are the renal arteries and the common     iliacs.   FINAL DIAGNOSES:  1. Angiographically patent coronary arteries except for a tiny old occlusion     of an optional diagonal branch off the proximal left anterior descending.  2. Low normal left ventricular systolic function.  3. Normal abdominal aorta.  4. Normal renal arteries.   PLAN:  The patient will need to be reloaded on her Coumadin for prevention  of stroke and other embolic events and will be able to go home when PT/INR  at target of 2.0.   ASSESSMENT & PLAN:     1. Bilateral lower extremity edema -This is a chronic, multifactorial problem. 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 discontinued diuretics during recent admission with improved Scr to 1.38 at discharge from 1.83 at admission.  Discontinued gabapentin today due to worsening edema. Pending BMET today. Will start her on lasix and treat for possible cellulitis.  2. CKD stage II - as above  3. Cor pulmonale with R sided heart failure due to pulmonary hypertension - Seems stable. Last echo 05/2013 showed LV EF of 55-60%, no WM abnormality but her right ventricle is moderately to severely dilated and her left atrium is severely dilated. Mild to moderate MR, moderate to severe TR and PA pressure of 15mm Hg.  If no improvement of edema, consider repeat echo.  4. Chronic atrial  fibrillation - Off anticoagulation due to frequent fall. Continue BB. Rate stable.   5. CAD Known occluded proximal diagonal artery.  Last coronary angiogram performed in 2004. Most recent nuclear perfusion study in  2012/2013  without reversible ischemia per note. Continue asa and statin.   6. HLD 09/10/2015: Cholesterol 59; HDL 32; LDL Cholesterol 23; Triglycerides 22; VLDL 4. Continue statin.   7. Hypertension - stable   8. OSA not on CPAP      Medication Adjustments/Labs and Tests Ordered: Current medicines are reviewed at length with the patient today.  Concerns regarding medicines are outlined above.  Medication changes, Labs and Tests ordered today are listed in the Patient Instructions below. Patient Instructions  Medication Instructions:  START Lasix 40 mg take 1 tablets twice a day for 3 days, then 1 tablets daily Take Keflex 500 mg twice a day for 7 days  Labwork: BMP in 1 week  Testing/Procedures: None Ordered  Follow-Up: Your physician recommends that you schedule a follow-up appointment in:  2 weeks with Victoria Fisher and Next available with Dr Sallyanne Kuster   Any Other Special Instructions Will Be Listed Below (If Applicable).   If you need a refill on your cardiac medications before your next appointment, please call your pharmacy.      Jarrett Soho, [place]  10/17/2015 2:52 PM    [facility] [address], [place], [place]  [address] Phone: [phone]; Fax: [phone]

## 2015-10-17 NOTE — Patient Instructions (Addendum)
Medication Instructions:  START Lasix 40 mg take 1 tablets twice a day for 3 days, then 1 tablets daily Take Keflex 500 mg twice a day for 7 days  Labwork: BMP in 1 week  Testing/Procedures: None Ordered  Follow-Up: Your physician recommends that you schedule a follow-up appointment in:  2 weeks with Lurena Joiner and Next available with Dr Sallyanne Kuster   Any Other Special Instructions Will Be Listed Below (If Applicable).   If you need a refill on your cardiac medications before your next appointment, please call your pharmacy.

## 2015-10-18 ENCOUNTER — Telehealth: Payer: Self-pay

## 2015-10-18 ENCOUNTER — Telehealth: Payer: Self-pay | Admitting: Cardiology

## 2015-10-18 ENCOUNTER — Telehealth: Payer: Self-pay | Admitting: Neurology

## 2015-10-18 LAB — COMPREHENSIVE METABOLIC PANEL
ALK PHOS: 93 IU/L (ref 39–117)
ALT: 9 IU/L (ref 0–32)
AST: 18 IU/L (ref 0–40)
Albumin/Globulin Ratio: 1.4 (ref 1.2–2.2)
Albumin: 4 g/dL (ref 3.5–4.8)
BILIRUBIN TOTAL: 0.7 mg/dL (ref 0.0–1.2)
BUN/Creatinine Ratio: 31 — ABNORMAL HIGH (ref 12–28)
BUN: 57 mg/dL — AB (ref 8–27)
CHLORIDE: 98 mmol/L (ref 96–106)
CO2: 22 mmol/L (ref 18–29)
Calcium: 9.8 mg/dL (ref 8.7–10.3)
Creatinine, Ser: 1.84 mg/dL — ABNORMAL HIGH (ref 0.57–1.00)
GFR calc Af Amer: 30 mL/min/{1.73_m2} — ABNORMAL LOW (ref >59)
GFR calc non Af Amer: 26 mL/min/{1.73_m2} — ABNORMAL LOW (ref >59)
GLUCOSE: 96 mg/dL (ref 65–99)
Globulin, Total: 2.9 g/dL (ref 1.5–4.5)
Potassium: 4.6 mmol/L (ref 3.5–5.2)
Sodium: 142 mmol/L (ref 134–144)
TOTAL PROTEIN: 6.9 g/dL (ref 6.0–8.5)

## 2015-10-18 NOTE — Telephone Encounter (Signed)
No answer when dialed. Nurse needs written Rx for this patient for Lasix and Keflex course.  Pt saw [facility] yesterday, he is currently out of office.  Pt to follow up w/ Dr. Sallyanne Kuster -- will route to provider to see if OK for signature.

## 2015-10-18 NOTE — Telephone Encounter (Signed)
Spoke w/ Science writer at [facility]. She reports they did get medication filled.  Informed me patient has had significant swelling and was too worried about her cognitively impaired husband to focus on med compliance. They will keep close eye on patient this weekend. I gave my direct line to call on Tuesday so that any updates may be relayed. Director is aware that if patient is worse, no appreciable improvement of her fluid load issues, to send to ER. O/w, if urgent/new concerns, can contact our office for pager access over weekend. She voiced understanding and thanks.

## 2015-10-18 NOTE — Telephone Encounter (Signed)
I called the patient. The blood work shows worsening renal status. I will send the blood work report to the primary care doctor and to Dr. Lorrene Reid.

## 2015-10-18 NOTE — Telephone Encounter (Signed)
Referral forms completed, awaiting signature.

## 2015-10-18 NOTE — Telephone Encounter (Signed)
-----   Message from Kathrynn Ducking, MD sent at 10/17/2015  1:28 PM EDT ----- Home health physical therapy has been requested on this patient, the [facility] wishes [facility]

## 2015-10-18 NOTE — Telephone Encounter (Signed)
Victoria Fisher ( [name]) is calling to get written order for her lasixk And keflex faxed to [phone].   Thanks

## 2015-10-19 DIAGNOSIS — T148XXA Other injury of unspecified body region, initial encounter: Secondary | ICD-10-CM

## 2015-10-19 DIAGNOSIS — T40601A Poisoning by unspecified narcotics, accidental (unintentional), initial encounter: Secondary | ICD-10-CM

## 2015-10-22 ENCOUNTER — Telehealth: Payer: Self-pay | Admitting: Cardiovascular Disease

## 2015-10-22 NOTE — Addendum Note (Signed)
Addended by: Vennie Homans on: 10/22/2015 11:48 AM   Modules accepted: Orders

## 2015-10-22 NOTE — Telephone Encounter (Signed)
Left msg for nurse to return call.

## 2015-10-22 NOTE — Telephone Encounter (Signed)
New message    Victoria Fisher is calling from [facility] and she verbalized that she is returning call for [facility]  Pt c/o swelling: STAT is pt has developed SOB within 24 hours  1. How long have you been experiencing swelling? 9-31, pt historically had edema   2. Where is the swelling located? Left leg to mid thigh and the right leg to the groin  3.  Are you currently taking a "fluid pill"? yes  4.  Are you currently SOB? no  5.  Have you traveled recently? no      Also previously she had been on potassium and she dont have acces to her electrolights  Pt has an allergy to gabapentin

## 2015-10-22 NOTE — Telephone Encounter (Signed)
Returned call. Victoria Fisher is w Home Care management for patient. Notes patient symptoms improved after recent med changes. Reduction of swelling, absence of dyspnea & fatigue. I have called patient. She reports feeling very well. She has no concerns at this time. Pt advised to continue Lasix, and course of Keflex as instructed. She is aware she may call us if any needs, new concerns. She voiced thanks and understanding of recommendations.  Note: Victoria Fisher informs me [facility] will still need a written and signed order for the meds prescribed 8/31 -- should be faxed to their office. This can be sent via fax to [phone].

## 2015-10-23 NOTE — Telephone Encounter (Addendum)
Referral signed and faxed but [facility] did not accept patient for home health. Will send referral to [facility].

## 2015-10-25 NOTE — Telephone Encounter (Signed)
Signed order faxed to [facility] to the number provided.

## 2015-10-28 ENCOUNTER — Encounter: Payer: Self-pay | Admitting: Nephrology

## 2015-10-28 DIAGNOSIS — K589 Irritable bowel syndrome without diarrhea: Secondary | ICD-10-CM | POA: Diagnosis not present

## 2015-10-28 DIAGNOSIS — K5790 Diverticulosis of intestine, part unspecified, without perforation or abscess without bleeding: Secondary | ICD-10-CM | POA: Diagnosis not present

## 2015-10-28 DIAGNOSIS — J449 Chronic obstructive pulmonary disease, unspecified: Secondary | ICD-10-CM | POA: Diagnosis not present

## 2015-10-28 DIAGNOSIS — F329 Major depressive disorder, single episode, unspecified: Secondary | ICD-10-CM | POA: Diagnosis not present

## 2015-10-28 DIAGNOSIS — R296 Repeated falls: Secondary | ICD-10-CM | POA: Diagnosis not present

## 2015-10-28 DIAGNOSIS — G4700 Insomnia, unspecified: Secondary | ICD-10-CM | POA: Diagnosis not present

## 2015-10-28 DIAGNOSIS — E039 Hypothyroidism, unspecified: Secondary | ICD-10-CM | POA: Diagnosis not present

## 2015-10-28 DIAGNOSIS — N183 Chronic kidney disease, stage 3 (moderate): Secondary | ICD-10-CM | POA: Diagnosis not present

## 2015-10-28 DIAGNOSIS — I270 Primary pulmonary hypertension: Secondary | ICD-10-CM | POA: Diagnosis not present

## 2015-10-28 DIAGNOSIS — E785 Hyperlipidemia, unspecified: Secondary | ICD-10-CM | POA: Diagnosis not present

## 2015-10-28 DIAGNOSIS — R2689 Other abnormalities of gait and mobility: Secondary | ICD-10-CM | POA: Diagnosis not present

## 2015-10-28 DIAGNOSIS — Z85038 Personal history of other malignant neoplasm of large intestine: Secondary | ICD-10-CM | POA: Diagnosis not present

## 2015-10-28 DIAGNOSIS — Z8611 Personal history of tuberculosis: Secondary | ICD-10-CM | POA: Diagnosis not present

## 2015-10-28 DIAGNOSIS — I5032 Chronic diastolic (congestive) heart failure: Secondary | ICD-10-CM | POA: Diagnosis not present

## 2015-10-28 DIAGNOSIS — G4733 Obstructive sleep apnea (adult) (pediatric): Secondary | ICD-10-CM | POA: Diagnosis not present

## 2015-10-28 DIAGNOSIS — I2510 Atherosclerotic heart disease of native coronary artery without angina pectoris: Secondary | ICD-10-CM | POA: Diagnosis not present

## 2015-10-28 DIAGNOSIS — I482 Chronic atrial fibrillation: Secondary | ICD-10-CM | POA: Diagnosis not present

## 2015-10-29 ENCOUNTER — Emergency Department (HOSPITAL_COMMUNITY)
Admission: EM | Admit: 2015-10-29 | Discharge: 2015-10-29 | Disposition: A | Discharge status: Home / Self Care | Payer: Medicare Other | Attending: Emergency Medicine | Admitting: Emergency Medicine

## 2015-10-29 ENCOUNTER — Encounter (HOSPITAL_COMMUNITY): Payer: Self-pay | Admitting: Emergency Medicine

## 2015-10-29 DIAGNOSIS — Z792 Long term (current) use of antibiotics: Secondary | ICD-10-CM | POA: Diagnosis not present

## 2015-10-29 DIAGNOSIS — I130 Hypertensive heart and chronic kidney disease with heart failure and stage 1 through stage 4 chronic kidney disease, or unspecified chronic kidney disease: Secondary | ICD-10-CM | POA: Diagnosis not present

## 2015-10-29 DIAGNOSIS — Y999 Unspecified external cause status: Secondary | ICD-10-CM | POA: Insufficient documentation

## 2015-10-29 DIAGNOSIS — I2510 Atherosclerotic heart disease of native coronary artery without angina pectoris: Secondary | ICD-10-CM | POA: Insufficient documentation

## 2015-10-29 DIAGNOSIS — I5033 Acute on chronic diastolic (congestive) heart failure: Secondary | ICD-10-CM | POA: Diagnosis not present

## 2015-10-29 DIAGNOSIS — S91114A Laceration without foreign body of right lesser toe(s) without damage to nail, initial encounter: Secondary | ICD-10-CM | POA: Diagnosis not present

## 2015-10-29 DIAGNOSIS — Z23 Encounter for immunization: Secondary | ICD-10-CM | POA: Diagnosis not present

## 2015-10-29 DIAGNOSIS — Y9389 Activity, other specified: Secondary | ICD-10-CM | POA: Insufficient documentation

## 2015-10-29 DIAGNOSIS — N183 Chronic kidney disease, stage 3 (moderate): Secondary | ICD-10-CM | POA: Insufficient documentation

## 2015-10-29 DIAGNOSIS — Z7982 Long term (current) use of aspirin: Secondary | ICD-10-CM | POA: Insufficient documentation

## 2015-10-29 DIAGNOSIS — W272XXA Contact with scissors, initial encounter: Secondary | ICD-10-CM | POA: Diagnosis not present

## 2015-10-29 DIAGNOSIS — Z91040 Latex allergy status: Secondary | ICD-10-CM | POA: Diagnosis not present

## 2015-10-29 DIAGNOSIS — Z4800 Encounter for change or removal of nonsurgical wound dressing: Secondary | ICD-10-CM | POA: Diagnosis not present

## 2015-10-29 DIAGNOSIS — Z5189 Encounter for other specified aftercare: Secondary | ICD-10-CM

## 2015-10-29 DIAGNOSIS — Y9289 Other specified places as the place of occurrence of the external cause: Secondary | ICD-10-CM | POA: Diagnosis not present

## 2015-10-29 DIAGNOSIS — Z79899 Other long term (current) drug therapy: Secondary | ICD-10-CM | POA: Insufficient documentation

## 2015-10-29 DIAGNOSIS — J449 Chronic obstructive pulmonary disease, unspecified: Secondary | ICD-10-CM | POA: Insufficient documentation

## 2015-10-29 DIAGNOSIS — J45909 Unspecified asthma, uncomplicated: Secondary | ICD-10-CM | POA: Diagnosis not present

## 2015-10-29 DIAGNOSIS — Z85038 Personal history of other malignant neoplasm of large intestine: Secondary | ICD-10-CM | POA: Diagnosis not present

## 2015-10-29 DIAGNOSIS — T148 Other injury of unspecified body region: Secondary | ICD-10-CM | POA: Diagnosis not present

## 2015-10-29 MED ORDER — TETANUS-DIPHTH-ACELL PERTUSSIS 5-2.5-18.5 LF-MCG/0.5 IM SUSP
0.5000 mL | Freq: Once | INTRAMUSCULAR | Status: AC
  Administered 2015-10-29: 0.5 mL via INTRAMUSCULAR
  Filled 2015-10-29: qty 0.5

## 2015-10-29 NOTE — ED Triage Notes (Signed)
Pt comes from independent living at [facility] via EMS with a toe laceration.  Pt states she was trying to cut her toe nails with scissors and cut her toe. Bleeding not completely controlled at this time.

## 2015-10-29 NOTE — ED Notes (Signed)
Bed: [id number] Expected date:  Expected time:  Means of arrival:  Comments: 81 yo F  Toe laceration

## 2015-10-29 NOTE — ED Notes (Signed)
Pt's husband is in room 9 being treated as well.  Daughter of patients will pick them up as both of them are finished being treated.  Contact information in husband's chart.
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
## 2015-10-30 DIAGNOSIS — I5032 Chronic diastolic (congestive) heart failure: Secondary | ICD-10-CM | POA: Diagnosis not present

## 2015-10-30 DIAGNOSIS — D1801 Hemangioma of skin and subcutaneous tissue: Secondary | ICD-10-CM | POA: Diagnosis not present

## 2015-10-30 DIAGNOSIS — I890 Lymphedema, not elsewhere classified: Secondary | ICD-10-CM | POA: Diagnosis not present

## 2015-10-30 DIAGNOSIS — L814 Other melanin hyperpigmentation: Secondary | ICD-10-CM | POA: Diagnosis not present

## 2015-10-30 DIAGNOSIS — D485 Neoplasm of uncertain behavior of skin: Secondary | ICD-10-CM | POA: Diagnosis not present

## 2015-10-30 DIAGNOSIS — R296 Repeated falls: Secondary | ICD-10-CM | POA: Diagnosis not present

## 2015-10-30 DIAGNOSIS — N183 Chronic kidney disease, stage 3 (moderate): Secondary | ICD-10-CM | POA: Diagnosis not present

## 2015-10-30 DIAGNOSIS — I2510 Atherosclerotic heart disease of native coronary artery without angina pectoris: Secondary | ICD-10-CM | POA: Diagnosis not present

## 2015-10-30 DIAGNOSIS — D2271 Melanocytic nevi of right lower limb, including hip: Secondary | ICD-10-CM | POA: Diagnosis not present

## 2015-10-30 DIAGNOSIS — I10 Essential (primary) hypertension: Secondary | ICD-10-CM | POA: Diagnosis not present

## 2015-10-30 DIAGNOSIS — L98499 Non-pressure chronic ulcer of skin of other sites with unspecified severity: Secondary | ICD-10-CM | POA: Diagnosis not present

## 2015-10-30 DIAGNOSIS — R2689 Other abnormalities of gait and mobility: Secondary | ICD-10-CM | POA: Diagnosis not present

## 2015-10-30 DIAGNOSIS — Z85828 Personal history of other malignant neoplasm of skin: Secondary | ICD-10-CM | POA: Diagnosis not present

## 2015-10-30 DIAGNOSIS — L821 Other seborrheic keratosis: Secondary | ICD-10-CM | POA: Diagnosis not present

## 2015-10-30 DIAGNOSIS — I482 Chronic atrial fibrillation: Secondary | ICD-10-CM | POA: Diagnosis not present

## 2015-10-31 ENCOUNTER — Ambulatory Visit: Payer: Self-pay | Admitting: Physical Medicine & Rehabilitation

## 2015-10-31 DIAGNOSIS — Q828 Other specified congenital malformations of skin: Secondary | ICD-10-CM | POA: Diagnosis not present

## 2015-10-31 LAB — BASIC METABOLIC PANEL
BUN: 38 mg/dL — ABNORMAL HIGH (ref 7–25)
CO2: 32 mmol/L — ABNORMAL HIGH (ref 20–31)
Calcium: 9.5 mg/dL (ref 8.6–10.4)
Chloride: 99 mmol/L (ref 98–110)
Creat: 1.34 mg/dL — ABNORMAL HIGH (ref 0.60–0.93)
Glucose, Bld: 93 mg/dL (ref 65–99)
Potassium: 4.5 mmol/L (ref 3.5–5.3)
Sodium: 138 mmol/L (ref 135–146)

## 2015-11-01 ENCOUNTER — Telehealth: Payer: Self-pay | Admitting: Neurology

## 2015-11-01 NOTE — Telephone Encounter (Signed)
I called patient. The edema apparently is getting worse, he was already very severe when I saw the patient. The etiology of this is not clear, the patient is still not on her diuretics, not clear if her thyroid has been checked recently. She will be seeing Dr. Lorrene Reid next week. The patient does have some chronic renal insufficiency, but I do not think that this explains all of her symptoms. Her total protein and albumin levels are now normal.

## 2015-11-01 NOTE — Telephone Encounter (Signed)
Patient called to advise, "blown up again like a toad, one breast is 3 times larger than other breast, stomach and legs are swollen, is there anything that Dr. [name] can do to help". Please call to advise.

## 2015-11-05 ENCOUNTER — Telehealth: Payer: Self-pay | Admitting: Cardiovascular Disease

## 2015-11-05 NOTE — Telephone Encounter (Signed)
Agree, OK to take extra 20 mg to see if that helps. Increase K in diet when she does that. Thanks! MCr

## 2015-11-05 NOTE — Telephone Encounter (Signed)
Please send written order with doctor signature for medicine change for her Furosemide.Please fax to [phone].

## 2015-11-05 NOTE — Telephone Encounter (Signed)
Returned call. Patient has had some recurrent issues with LE swelling, states this is also in her belly as well. She currently takes 40mg  lasix daily. Last BMET stable. Notes meds managed through [facility] (we would need to notify them of long term changes). She is seeking recommendations. Not noticeably more short of breath or fatigued, though she states her leg swelling is bothersome. Not tight or painful, but she notes difficulty with walking.   She sees Victoria Fisher in f/u on Thursday AM - aware I will route to Dr. Sallyanne Kuster for recommendations. In case she does not hear back today, I advised her OK to take extra 20mg  lasix this afternoon and keep legs elevated.

## 2015-11-05 NOTE — Telephone Encounter (Signed)
Acknowledged - will do so when Dr. Sallyanne Kuster or Gerald Stabs make adjustment recommendations.

## 2015-11-05 NOTE — Telephone Encounter (Signed)
New message      Pt c/o swelling: STAT is pt has developed SOB within 24 hours  1. How long have you been experiencing swelling? For a long time---has gotten worse and swelling does not go down at night 2. Where is the swelling located? Stomach, legs up to thigh, ankles, feet, and one breast  3.  Are you currently taking a "fluid pill"? Yes, furosemide 40mg  daily 4.  Are you currently SOB?  yes 5.  Have you traveled recently? no

## 2015-11-06 DIAGNOSIS — N183 Chronic kidney disease, stage 3 (moderate): Secondary | ICD-10-CM | POA: Diagnosis not present

## 2015-11-06 DIAGNOSIS — I482 Chronic atrial fibrillation: Secondary | ICD-10-CM | POA: Diagnosis not present

## 2015-11-06 DIAGNOSIS — I2510 Atherosclerotic heart disease of native coronary artery without angina pectoris: Secondary | ICD-10-CM | POA: Diagnosis not present

## 2015-11-06 DIAGNOSIS — R2689 Other abnormalities of gait and mobility: Secondary | ICD-10-CM | POA: Diagnosis not present

## 2015-11-06 DIAGNOSIS — I5032 Chronic diastolic (congestive) heart failure: Secondary | ICD-10-CM | POA: Diagnosis not present

## 2015-11-06 DIAGNOSIS — R296 Repeated falls: Secondary | ICD-10-CM | POA: Diagnosis not present

## 2015-11-06 NOTE — Telephone Encounter (Signed)
F/u    Nurse wants to find out about what the increase of Lasix is going to be. Please call.

## 2015-11-06 NOTE — Telephone Encounter (Signed)
Patient aware of recommendations and to f/u w Gerald Stabs tomorrow - further consideration for lasix dose increase to be discussed at appt after provider can assess fluid retention firsthand. Permanent changes to med regimen will need to be communicated to [facility] staff. Orders can be faxed to [phone].

## 2015-11-06 NOTE — Telephone Encounter (Signed)
Left msg for home care nurse w details regarding recent recommended changes and advised to wait on further advisements after Gerald Stabs has seen patient. Also instructed nurse to call back if needed.

## 2015-11-07 ENCOUNTER — Ambulatory Visit (INDEPENDENT_AMBULATORY_CARE_PROVIDER_SITE_OTHER): Payer: Medicare Other | Admitting: Nurse Practitioner

## 2015-11-07 ENCOUNTER — Encounter (HOSPITAL_COMMUNITY)
Admission: RE | Admit: 2015-11-07 | Discharge: 2015-11-07 | Disposition: A | Discharge status: Home / Self Care | Payer: Medicare Other | Source: Ambulatory Visit | Attending: Nephrology | Admitting: Nephrology

## 2015-11-07 ENCOUNTER — Encounter: Payer: Self-pay | Admitting: Nurse Practitioner

## 2015-11-07 DIAGNOSIS — N183 Chronic kidney disease, stage 3 unspecified: Secondary | ICD-10-CM

## 2015-11-07 DIAGNOSIS — I482 Chronic atrial fibrillation, unspecified: Secondary | ICD-10-CM

## 2015-11-07 DIAGNOSIS — N184 Chronic kidney disease, stage 4 (severe): Secondary | ICD-10-CM | POA: Diagnosis not present

## 2015-11-07 DIAGNOSIS — D638 Anemia in other chronic diseases classified elsewhere: Secondary | ICD-10-CM | POA: Insufficient documentation

## 2015-11-07 DIAGNOSIS — I5033 Acute on chronic diastolic (congestive) heart failure: Secondary | ICD-10-CM | POA: Diagnosis not present

## 2015-11-07 DIAGNOSIS — I110 Hypertensive heart disease with heart failure: Secondary | ICD-10-CM | POA: Diagnosis not present

## 2015-11-07 DIAGNOSIS — I2781 Cor pulmonale (chronic): Secondary | ICD-10-CM | POA: Diagnosis not present

## 2015-11-07 DIAGNOSIS — I2510 Atherosclerotic heart disease of native coronary artery without angina pectoris: Secondary | ICD-10-CM

## 2015-11-07 LAB — IRON AND TIBC
Iron: 40 ug/dL (ref 28–170)
SATURATION RATIOS: 14 % (ref 10.4–31.8)
TIBC: 295 ug/dL (ref 250–450)
UIBC: 255 ug/dL

## 2015-11-07 LAB — POCT HEMOGLOBIN-HEMACUE: Hemoglobin: 10.8 g/dL — ABNORMAL LOW (ref 12.0–15.0)

## 2015-11-07 LAB — FERRITIN: FERRITIN: 116 ng/mL (ref 11–307)

## 2015-11-07 MED ORDER — DARBEPOETIN ALFA 100 MCG/0.5ML IJ SOSY
100.0000 ug | PREFILLED_SYRINGE | INTRAMUSCULAR | Status: DC
  Administered 2015-11-07: 100 ug via SUBCUTANEOUS

## 2015-11-07 MED ORDER — FUROSEMIDE 40 MG PO TABS: 40.0000 mg | ORAL_TABLET | Freq: Two times a day (BID) | ORAL | 3 refills | Status: DC

## 2015-11-07 MED ORDER — DARBEPOETIN ALFA 100 MCG/0.5ML IJ SOSY
PREFILLED_SYRINGE | INTRAMUSCULAR | Status: AC
  Filled 2015-11-07: qty 0.5

## 2015-11-07 NOTE — Progress Notes (Signed)
Cardiology Clinic Note   Patient Name: Victoria Fisher Date of Encounter: 11/07/2015  Primary Care Provider:  Haywood Pao, MD Primary Cardiologist:  Jerilynn Mages. Croitoru, MD   Patient Profile    81 year old female with history of CAD, diastolic failure, pulmonary hypertension, CK D3, hypertension, hyperlipidemia, and chronic lower extremity edema who presents for follow-up related to lower extremity edema.  Past Medical History    Past Medical History:  Diagnosis Date  . Abnormality of gait 01/30/2015  . Anemia in CKD (chronic kidney disease)   . Anxiety   . Arthritis   . Asthma    On nebulizer  . Atrial fibrillation, chronic ([place])    a. Rate controlled w/ BB. No OAC 2/2 falls and gait instability.  . Bowel obstruction ([place])    Caused by scar tissue  . Chronic cor pulmonale (H[facility])    Most likely due to COPD (cannot rule out contribution of diastolic LV dysfunction). ECHO 07/30/11 pulmonary artery pressure was estimated to be 50-60% mmHg & there was moderate tricuspid insufficiency.  . Chronic diastolic CHF (congestive heart failure) ([place])    a. Predominantly right heart failure due to cor pulmonale & in turn this is most likely due to untreated OSA;  b. 05/2013 Echo: EF 55-60%, no rwma, mild to mod MR, sev dil LA, mod to sev dil RV w/ reduced systolic fxn, massively dil RA, mod-sev TR, PASP 67mmHg.  [name] Chronic pain syndrome   . CKD (chronic kidney disease), stage III   . Colon cancer ([place])    Carcinoma of sigmoid colon 1984 & 1985.  [name] COPD (chronic obstructive pulmonary disease) ([place])   . Coronary artery disease    a. occluded proximal diagonal artery on angiography 2004  . Depression   . Diverticulosis of colon   . 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 Dyspnea on exertion    Reluctant to take diuretics  . Edema of both legs    Venous Doppler 05/11/12 was normal & no evidence of thrombus or thrombophlebitis. Chronic LE edema related to both cor pulmonale & lymphedema,  hyperlipidemia & HTN involving coronary atherosclerosis by cardiac cath in 2004. Cath showed a tiny old occlusion of the optional diagonal branch & also proximal LAD.  [name] Emphysema of lung ([place])   . Frequent falls    Possibly due to orthostatic hypotension but we have not seen that on our visits. Recent fall 05/2012 with scalp hematoma, swollen left LE, & black eyes.  . Hyperlipidemia    On a statin. Catheterization 02/12/03 Showed a tiny old occlusion of the optional diagonal branch & also proximal LAD.  [name] Hypertension   . Hypothyroidism 2002   On hormone replacement therapy  . IBS (irritable bowel syndrome)   . Insomnia   . Kidney failure 09/05/2015   Pt states that she went to the doctor on thursday and was told she is in stage 4 kidney failure  . Lung nodule   . MI (myocardial infarction) ([place]) 1999   Unstable angina  . Milroy's disease    Lymphatic disorder diagnosed at [facility]  . Obstructive sleep apnea    Polysomnogram 09/25/11 AHI: 53.6/hr overall & 57.1/hr during REM sleep. Poor compliance previously with CPAP  . Pulmonary hypertension ([place])    Pullmonary hypertension, chronic cor pulmonale w/pulmonary artery pressures of 50&#x2011; 60 mmHg.  . Tuberculosis    Past Surgical History:  Procedure Laterality Date  . ABDOMINAL HYSTERECTOMY  1983  . APPENDECTOMY  1956  . CARDIAC  CATHETERIZATION  02/12/03   Showed a tiny old occlusion of the optional diagonal branch & also proximal LAD.  [name] CHOLECYSTECTOMY  1984  . COLON BIOPSY    . COLON RESECTION     2 times  . COLONOSCOPY  07/23/10    Allergies  Allergies  Allergen Reactions  . Codeine Anaphylaxis, Hives and Nausea And Vomiting  . Ciprofloxacin Hives and Nausea And Vomiting  . Latex Hives and Rash  . Penicillins Hives    Has patient had a PCN reaction causing immediate rash, facial/tongue/throat swelling, SOB or lightheadedness with hypotension: Yes Has patient had a PCN reaction causing severe rash involving mucus membranes  or skin necrosis: Yes Has patient had a PCN reaction that required hospitalization Yes Has patient had a PCN reaction occurring within the last 10 years: Yes If all of the above answers are "NO", then may proceed with Cephalosporin use.  . Sulfonamide Derivatives Hives and Rash    History of Present Illness    81 year old female with the above complex past medical history  Including CAD by remote catheterization [id number], COPD, diastolic heart failure, stage III chronic kidney disease, chronic atrial fibrillation (not on it granulation due to multiple falls), hypertension, hyperlipidemia, and pulmonary hypertension with right ventricular systolic dysfunction. She was admitted to the hospital in July of this year with encephalopathy and syncope in the setting of hypoglycemia and hypotension which was felt to potentially be related to inadvertently taking her husband's medications. In that setting, she had acute kidney injury with a creatinine of 1.83. Her diuretics were discontinued. Off of diuretics, she developed significant lower extremity swelling and she was seen in clinic our team on August 31. Lasix 40 mg daily was resumed. Prior home dose was 80 mg in the morning and 40 mg in the afternoon. She has not noticed much improvement in swelling at all and in fact she feels as though her legs are tighter and swollen up to her hips. She does have some degree of dyspnea on exertion and also orthopnea with this. She is in no acute distress at rest. She has not been having any chest pain. She does not add salt to her food, though food is prepared by staff at the assisted living. She tries to keep her legs elevated as much as possible and does feels though that helps some. Her weight is actually down 10 pounds on our scale compared to when she was seen August 31. She denies PND, dizziness, syncope, or early satiety. She was seen in the emergency department just over a week ago after stubbing her her toes on the  right foot. Labs at that time showed stable renal function.  Home Medications    Prior to Admission medications   Medication Sig Start Date End Date Taking? Authorizing Provider  aspirin EC 81 MG tablet Take 81 mg by mouth every morning.    Yes Historical Provider, MD  atorvastatin (LIPITOR) 40 MG tablet Take 40 mg by mouth every evening.    Yes Historical Provider, MD  baclofen (LIORESAL) 10 MG tablet Take 0.5 tablets (5 mg total) by mouth 2 (two) times daily. 08/29/15  Yes Kathrynn Ducking, MD  cephALEXin (KEFLEX) 500 MG capsule Take 1 capsule (500 mg total) by mouth 2 (two) times daily. 10/17/15  Yes Erlene Quan, PA-C  cholecalciferol (VITAMIN D) 1000 UNITS tablet Take 2,000 Units by mouth daily with breakfast.    Yes Historical Provider, MD  docusate sodium (COLACE) 100 MG capsule  Take 100 mg by mouth 2 (two) times daily.   Yes Historical Provider, MD  ferrous sulfate 325 (65 FE) MG tablet Take 325 mg by mouth daily.   Yes Historical Provider, MD  furosemide (LASIX) 40 MG tablet Take 1 tablet (40 mg total) by mouth 2 (two) times daily. 11/07/15 05/05/16 Yes Rogelia Mire, NP  levalbuterol [facility] HFA) 45 MCG/ACT inhaler Inhale 1 puff into the lungs every 4 (four) hours as needed for wheezing.   Yes Historical Provider, MD  levothyroxine (SYNTHROID, LEVOTHROID) 112 MCG tablet Take 1 tablet (112 mcg total) by mouth every morning. Patient taking differently: Take 112 mcg by mouth daily before breakfast.  06/30/13  Yes Mihai Croitoru, MD  magnesium oxide (MAG-OX) 400 (241.3 MG) MG tablet Take 1 tablet (400 mg total) by mouth daily. 07/11/13  Yes Haywood Pao, MD  metoprolol succinate (TOPROL-XL) 50 MG 24 hr tablet Take 50 mg by mouth daily. Take with or immediately following a meal.   Yes Historical Provider, MD  morphine (MS CONTIN) 30 MG 12 hr tablet Take 1 tablet (30 mg total) by mouth 2 (two) times daily. 09/13/15  Yes Allie Bossier, MD  morphine (MSIR) 15 MG tablet Take 1 tablet (15  mg total) by mouth 3 (three) times daily as needed (breakthrough pain). 09/13/15  Yes Allie Bossier, MD  nitroGLYCERIN (NITROLINGUAL) 0.4 MG/SPRAY spray Place 1 spray under the tongue every 5 (five) minutes as needed for chest pain. 12/13/12  Yes Mihai Croitoru, MD  polyethylene glycol (MIRALAX / GLYCOLAX) packet Take 17 g by mouth 2 (two) times daily as needed.   Yes Historical Provider, MD  sennosides-docusate sodium (SENOKOT-S) 8.6-50 MG tablet Take 2-3 tablets by mouth daily as needed for constipation.   Yes Historical Provider, MD  sertraline (ZOLOFT) 100 MG tablet TK 1 T PO D 09/20/15  Yes Historical Provider, MD    Family History    Family History  Problem Relation Age of Onset  . Heart disease Father   . Alzheimer's disease Mother   . Parkinsonism Brother   . Diabetes Brother     Social History    Social History   Social History  . Marital status: Married    Spouse name: Georgiann Mccoy  . Number of children: 3  . Years of education: HS   Occupational History  .  Retired   Social History Main Topics  . Smoking status: Never Smoker  . Smokeless tobacco: Never Used  . Alcohol use No  . Drug use: No  . Sexual activity: No   Other Topics Concern  . Not on file   Social History Narrative   Patient is married Georgiann Mccoy) and lives at home with her husband.   Patient is retired.   Patient has three children and her husband has two children.   Patient is right-handed.   Patient drinks very little caffeine (1/2 cup daily)   Patient has a high school education.     Review of Systems    General:  No chills, fever, night sweats or weight changes.  Cardiovascular:  No chest pain, +++ dyspnea on exertion, +++ edema, +++ orthopnea, no palpitations, paroxysmal nocturnal dyspnea. Dermatological: No rash, lesions/masses Respiratory: No cough, +++ dyspnea Urologic: No hematuria, dysuria Abdominal:   No nausea, vomiting, diarrhea, bright red blood per rectum, melena, or  hematemesis Neurologic:  No visual changes, wkns, changes in mental status. All other systems reviewed and are otherwise negative except as noted above.  Physical  Exam    VS:  113/68, 71, 16 AF , BMI Wt 160 lbs. GEN: Well nourished, well developed, in no acute distress.  HEENT: normal.  Neck: Supple, JVP to jaw.  No carotid bruits, or masses. Cardiac: RRR, 2/6 syst murmur @ LLSB, no rubs, or gallops. No clubbing, cyanosis, Massive lower ext edema ext up to her hips and flanks bilat.  Radials/DP/PT 1+ and equal bilaterally.  Respiratory:  Respirations regular and unlabored, clear to auscultation bilaterally. GI: Soft, nontender, nondistended, BS + x 4.  Flank edema bilat. MS: no deformity or atrophy. Skin: warm and dry, no rash. Neuro:  Strength and sensation are intact. Psych: Normal affect.  Accessory Clinical Findings    Lab Results  Component Value Date   CREATININE 1.34 (H) 10/30/2015   BUN 38 (H) 10/30/2015   NA 138 10/30/2015   K 4.5 10/30/2015   CL 99 10/30/2015   CO2 32 (H) 10/30/2015    Lab Results  Component Value Date   LABPROT 15.2 09/09/2015    Assessment & Plan   1.  Acute on chronic diastolic CHF/PAH w/ cor pulmonale/chronic lower extremity edema:  Patient presents again today for reevaluation related to edema. She feels as though it is worse since her last visit. At that time, she was placed on Lasix 40 mg daily. Previous home dose was apparently 80 mg in the morning and 40 mg in the afternoon. Renal function was stable upon evaluation in the ER on September 13. In that setting, I have asked her to increased her Lasix to 40 mg twice a day. We did discuss that she may require admission to get this amount of fluid off, however her husband was just discharged from the hospital last night and he has Alzheimer's. She would like to be with him and avoid being admitted if at all possible. As she is clinically stable, I think that this is reasonable for now, though if she  doesn't respond to additional Lasix have asked her to call us, at which point potentially we can switch her to torsemide and/or add metolazone. She will need close monitoring of renal function in that setting. Regardless, I have arranged for follow-up in one week with basic metabolic panel at that time. I would have a low threshold to admit her for IV diuresis and close observation if she remains markedly volume overloaded. I have also written for repeat echo as her last one was in 2015.  2. CAD: Status post remote catheterization in 2004. She had diagonal occlusion at that time. She has not been having chest pain. She remains on aspirin, statin, and beta blocker therapy.  3. Hypertensive heart disease: Blood pressure is stable on current regimen.  4. Hyperlipidemia: Continue statin therapy.   5. Chronic atrial fibrillation: Rate controlled on beta blocker. She is not on anticoagulation secondary to history of frequent falls.  6. Disposition: Follow-up in clinic in 1 week with labs at that time.    Murray Hodgkins, NP 11/07/2015, 1:53 PM

## 2015-11-07 NOTE — Progress Notes (Signed)
Thanks, Gerald Stabs. When I have seen her in the past, the leg edema is always much more impressive than the dyspnea.

## 2015-11-07 NOTE — Patient Instructions (Addendum)
Your physician recommends that you return for lab work in: next week prior to your appointment.  Your physician has recommended you make the following change in your medication:   1.) the furosemide ( lasix) has been increased to 40 mg twice a day. A new prescription has been sent to the pharmacy to reflect this change.  Your physician has requested that you have an echocardiogram. Echocardiography is a painless test that uses sound waves to create images of your heart. It provides your doctor with information about the size and shape of your heart and how well your heart's chambers and valves are working. This procedure takes approximately one hour. There are no restrictions for this procedure.  Your physician recommends that you schedule a follow-up appointment in: 1 week with Rosaria Ferries, Kerin Ransom, or Dr Sallyanne Kuster.

## 2015-11-08 DIAGNOSIS — I5032 Chronic diastolic (congestive) heart failure: Secondary | ICD-10-CM | POA: Diagnosis not present

## 2015-11-08 DIAGNOSIS — I482 Chronic atrial fibrillation: Secondary | ICD-10-CM | POA: Diagnosis not present

## 2015-11-08 DIAGNOSIS — N183 Chronic kidney disease, stage 3 (moderate): Secondary | ICD-10-CM | POA: Diagnosis not present

## 2015-11-08 DIAGNOSIS — I2510 Atherosclerotic heart disease of native coronary artery without angina pectoris: Secondary | ICD-10-CM | POA: Diagnosis not present

## 2015-11-08 DIAGNOSIS — R296 Repeated falls: Secondary | ICD-10-CM | POA: Diagnosis not present

## 2015-11-08 DIAGNOSIS — R2689 Other abnormalities of gait and mobility: Secondary | ICD-10-CM | POA: Diagnosis not present

## 2015-11-14 ENCOUNTER — Encounter (HOSPITAL_COMMUNITY): Payer: Self-pay | Admitting: Emergency Medicine

## 2015-11-14 ENCOUNTER — Inpatient Hospital Stay (HOSPITAL_COMMUNITY)
Admission: EM | Admit: 2015-11-14 | Discharge: 2015-11-17 | DRG: 291 | Disposition: A | Discharge status: Home / Self Care | Payer: Medicare Other | Attending: Internal Medicine | Admitting: Internal Medicine

## 2015-11-14 ENCOUNTER — Ambulatory Visit: Payer: Self-pay | Admitting: Physician Assistant

## 2015-11-14 ENCOUNTER — Emergency Department (HOSPITAL_COMMUNITY): Payer: Medicare Other

## 2015-11-14 ENCOUNTER — Telehealth: Payer: Self-pay | Admitting: Cardiovascular Disease

## 2015-11-14 DIAGNOSIS — Z85038 Personal history of other malignant neoplasm of large intestine: Secondary | ICD-10-CM

## 2015-11-14 DIAGNOSIS — I2781 Cor pulmonale (chronic): Secondary | ICD-10-CM | POA: Diagnosis present

## 2015-11-14 DIAGNOSIS — Z7982 Long term (current) use of aspirin: Secondary | ICD-10-CM

## 2015-11-14 DIAGNOSIS — E785 Hyperlipidemia, unspecified: Secondary | ICD-10-CM | POA: Diagnosis present

## 2015-11-14 DIAGNOSIS — R0902 Hypoxemia: Secondary | ICD-10-CM

## 2015-11-14 DIAGNOSIS — J449 Chronic obstructive pulmonary disease, unspecified: Secondary | ICD-10-CM | POA: Diagnosis present

## 2015-11-14 DIAGNOSIS — Z79890 Hormone replacement therapy (postmenopausal): Secondary | ICD-10-CM | POA: Diagnosis not present

## 2015-11-14 DIAGNOSIS — I129 Hypertensive chronic kidney disease with stage 1 through stage 4 chronic kidney disease, or unspecified chronic kidney disease: Secondary | ICD-10-CM | POA: Diagnosis not present

## 2015-11-14 DIAGNOSIS — I2510 Atherosclerotic heart disease of native coronary artery without angina pectoris: Secondary | ICD-10-CM | POA: Diagnosis present

## 2015-11-14 DIAGNOSIS — G4733 Obstructive sleep apnea (adult) (pediatric): Secondary | ICD-10-CM | POA: Diagnosis present

## 2015-11-14 DIAGNOSIS — I482 Chronic atrial fibrillation, unspecified: Secondary | ICD-10-CM | POA: Diagnosis present

## 2015-11-14 DIAGNOSIS — N184 Chronic kidney disease, stage 4 (severe): Secondary | ICD-10-CM | POA: Diagnosis present

## 2015-11-14 DIAGNOSIS — E8889 Other specified metabolic disorders: Secondary | ICD-10-CM | POA: Diagnosis present

## 2015-11-14 DIAGNOSIS — G894 Chronic pain syndrome: Secondary | ICD-10-CM | POA: Diagnosis present

## 2015-11-14 DIAGNOSIS — I480 Paroxysmal atrial fibrillation: Secondary | ICD-10-CM | POA: Diagnosis present

## 2015-11-14 DIAGNOSIS — I10 Essential (primary) hypertension: Secondary | ICD-10-CM | POA: Diagnosis present

## 2015-11-14 DIAGNOSIS — Q820 Hereditary lymphedema: Secondary | ICD-10-CM

## 2015-11-14 DIAGNOSIS — D631 Anemia in chronic kidney disease: Secondary | ICD-10-CM | POA: Diagnosis present

## 2015-11-14 DIAGNOSIS — E039 Hypothyroidism, unspecified: Secondary | ICD-10-CM | POA: Diagnosis present

## 2015-11-14 DIAGNOSIS — N183 Chronic kidney disease, stage 3 unspecified: Secondary | ICD-10-CM | POA: Diagnosis present

## 2015-11-14 DIAGNOSIS — I890 Lymphedema, not elsewhere classified: Secondary | ICD-10-CM

## 2015-11-14 DIAGNOSIS — I5033 Acute on chronic diastolic (congestive) heart failure: Secondary | ICD-10-CM | POA: Diagnosis present

## 2015-11-14 DIAGNOSIS — I130 Hypertensive heart and chronic kidney disease with heart failure and stage 1 through stage 4 chronic kidney disease, or unspecified chronic kidney disease: Principal | ICD-10-CM | POA: Diagnosis present

## 2015-11-14 DIAGNOSIS — J9601 Acute respiratory failure with hypoxia: Secondary | ICD-10-CM | POA: Diagnosis present

## 2015-11-14 DIAGNOSIS — Z23 Encounter for immunization: Secondary | ICD-10-CM

## 2015-11-14 DIAGNOSIS — K5909 Other constipation: Secondary | ICD-10-CM | POA: Diagnosis present

## 2015-11-14 DIAGNOSIS — I252 Old myocardial infarction: Secondary | ICD-10-CM | POA: Diagnosis not present

## 2015-11-14 DIAGNOSIS — K589 Irritable bowel syndrome without diarrhea: Secondary | ICD-10-CM | POA: Diagnosis present

## 2015-11-14 DIAGNOSIS — E871 Hypo-osmolality and hyponatremia: Secondary | ICD-10-CM | POA: Diagnosis present

## 2015-11-14 DIAGNOSIS — R1900 Intra-abdominal and pelvic swelling, mass and lump, unspecified site: Secondary | ICD-10-CM | POA: Diagnosis not present

## 2015-11-14 DIAGNOSIS — I5032 Chronic diastolic (congestive) heart failure: Secondary | ICD-10-CM | POA: Diagnosis not present

## 2015-11-14 DIAGNOSIS — J9621 Acute and chronic respiratory failure with hypoxia: Secondary | ICD-10-CM | POA: Diagnosis present

## 2015-11-14 DIAGNOSIS — N2581 Secondary hyperparathyroidism of renal origin: Secondary | ICD-10-CM | POA: Diagnosis not present

## 2015-11-14 DIAGNOSIS — E876 Hypokalemia: Secondary | ICD-10-CM | POA: Diagnosis present

## 2015-11-14 DIAGNOSIS — R0602 Shortness of breath: Secondary | ICD-10-CM | POA: Diagnosis not present

## 2015-11-14 DIAGNOSIS — R609 Edema, unspecified: Secondary | ICD-10-CM

## 2015-11-14 DIAGNOSIS — I4891 Unspecified atrial fibrillation: Secondary | ICD-10-CM | POA: Diagnosis not present

## 2015-11-14 LAB — COMPREHENSIVE METABOLIC PANEL
ALBUMIN: 3.5 g/dL (ref 3.5–5.0)
ALT: 10 U/L — ABNORMAL LOW (ref 14–54)
ANION GAP: 9 (ref 5–15)
AST: 19 U/L (ref 15–41)
Alkaline Phosphatase: 73 U/L (ref 38–126)
BUN: 32 mg/dL — AB (ref 6–20)
CHLORIDE: 100 mmol/L — AB (ref 101–111)
CO2: 30 mmol/L (ref 22–32)
Calcium: 9.6 mg/dL (ref 8.9–10.3)
Creatinine, Ser: 1.55 mg/dL — ABNORMAL HIGH (ref 0.44–1.00)
GFR calc Af Amer: 36 mL/min — ABNORMAL LOW (ref >60)
GFR calc non Af Amer: 31 mL/min — ABNORMAL LOW (ref >60)
GLUCOSE: 89 mg/dL (ref 65–99)
POTASSIUM: 3.2 mmol/L — AB (ref 3.5–5.1)
SODIUM: 139 mmol/L (ref 135–145)
Total Bilirubin: 0.9 mg/dL (ref 0.3–1.2)
Total Protein: 6.3 g/dL — ABNORMAL LOW (ref 6.5–8.1)

## 2015-11-14 LAB — CBC WITH DIFFERENTIAL/PLATELET
BASOS ABS: 0 10*3/uL (ref 0.0–0.1)
BASOS PCT: 1 %
EOS ABS: 0.1 10*3/uL (ref 0.0–0.7)
EOS PCT: 3 %
HEMATOCRIT: 37 % (ref 36.0–46.0)
Hemoglobin: 11.2 g/dL — ABNORMAL LOW (ref 12.0–15.0)
Lymphocytes Relative: 25 %
Lymphs Abs: 1.1 10*3/uL (ref 0.7–4.0)
MCH: 31.5 pg (ref 26.0–34.0)
MCHC: 30.3 g/dL (ref 30.0–36.0)
MCV: 104.2 fL — ABNORMAL HIGH (ref 78.0–100.0)
MONO ABS: 0.5 10*3/uL (ref 0.1–1.0)
Monocytes Relative: 11 %
NEUTROS ABS: 2.7 10*3/uL (ref 1.7–7.7)
Neutrophils Relative %: 61 %
PLATELETS: 109 10*3/uL — AB (ref 150–400)
RBC: 3.55 MIL/uL — ABNORMAL LOW (ref 3.87–5.11)
RDW: 15 % (ref 11.5–15.5)
WBC: 4.5 10*3/uL (ref 4.0–10.5)

## 2015-11-14 LAB — I-STAT CREATININE, ED: Creatinine, Ser: 1.4 mg/dL — ABNORMAL HIGH (ref 0.44–1.00)

## 2015-11-14 LAB — BRAIN NATRIURETIC PEPTIDE: B Natriuretic Peptide: 451.8 pg/mL — ABNORMAL HIGH (ref 0.0–100.0)

## 2015-11-14 MED ORDER — ACETAMINOPHEN 325 MG PO TABS
650.0000 mg | ORAL_TABLET | Freq: Four times a day (QID) | ORAL | Status: DC | PRN
Start: 2015-11-14 — End: 2015-11-17

## 2015-11-14 MED ORDER — MORPHINE SULFATE 15 MG PO TABS: 15.0000 mg | ORAL_TABLET | Freq: Three times a day (TID) | ORAL | Status: DC | PRN

## 2015-11-14 MED ORDER — LEVOTHYROXINE SODIUM 112 MCG PO TABS
112.0000 ug | ORAL_TABLET | Freq: Every day | ORAL | Status: DC
  Administered 2015-11-15 – 2015-11-17 (×3): 112 ug via ORAL
  Filled 2015-11-14 (×3): qty 1

## 2015-11-14 MED ORDER — DOCUSATE SODIUM 100 MG PO CAPS
100.0000 mg | ORAL_CAPSULE | Freq: Two times a day (BID) | ORAL | Status: DC
Start: 2015-11-15 — End: 2015-11-17
  Administered 2015-11-15 – 2015-11-17 (×4): 100 mg via ORAL
  Filled 2015-11-14 (×4): qty 1

## 2015-11-14 MED ORDER — ENOXAPARIN SODIUM 30 MG/0.3ML ~~LOC~~ SOLN
30.0000 mg | SUBCUTANEOUS | Status: DC
  Filled 2015-11-14 (×2): qty 0.3

## 2015-11-14 MED ORDER — INFLUENZA VAC SPLIT QUAD 0.5 ML IM SUSY
0.5000 mL | PREFILLED_SYRINGE | INTRAMUSCULAR | Status: AC
  Administered 2015-11-15: 0.5 mL via INTRAMUSCULAR
  Filled 2015-11-14: qty 0.5

## 2015-11-14 MED ORDER — ALBUTEROL SULFATE (2.5 MG/3ML) 0.083% IN NEBU: 2.5000 mg | INHALATION_SOLUTION | RESPIRATORY_TRACT | Status: DC | PRN

## 2015-11-14 MED ORDER — FUROSEMIDE 10 MG/ML IJ SOLN
40.0000 mg | Freq: Once | INTRAMUSCULAR | Status: AC
  Administered 2015-11-14: 40 mg via INTRAVENOUS
  Filled 2015-11-14: qty 4

## 2015-11-14 MED ORDER — METOPROLOL SUCCINATE ER 50 MG PO TB24
50.0000 mg | ORAL_TABLET | Freq: Every day | ORAL | Status: DC
  Administered 2015-11-15: 50 mg via ORAL
  Filled 2015-11-14: qty 1

## 2015-11-14 MED ORDER — ACETAMINOPHEN 650 MG RE SUPP
650.0000 mg | Freq: Four times a day (QID) | RECTAL | Status: DC | PRN
Start: 2015-11-14 — End: 2015-11-17

## 2015-11-14 MED ORDER — MORPHINE SULFATE (PF) 4 MG/ML IV SOLN: 4.0000 mg | INTRAVENOUS | Status: DC | PRN

## 2015-11-14 MED ORDER — DEXTROSE 5 % IV SOLN
120.0000 mg | Freq: Three times a day (TID) | INTRAVENOUS | Status: DC
  Administered 2015-11-14 – 2015-11-15 (×2): 120 mg via INTRAVENOUS
  Filled 2015-11-14 (×4): qty 12

## 2015-11-14 MED ORDER — BACLOFEN 10 MG PO TABS
5.0000 mg | ORAL_TABLET | Freq: Two times a day (BID) | ORAL | Status: DC
  Administered 2015-11-15 – 2015-11-16 (×4): 5 mg via ORAL
  Filled 2015-11-14 (×4): qty 1

## 2015-11-14 MED ORDER — ONDANSETRON HCL 4 MG PO TABS: 4.0000 mg | ORAL_TABLET | Freq: Four times a day (QID) | ORAL | Status: DC | PRN

## 2015-11-14 MED ORDER — ASPIRIN EC 81 MG PO TBEC
81.0000 mg | DELAYED_RELEASE_TABLET | Freq: Every morning | ORAL | Status: DC
  Administered 2015-11-15 – 2015-11-17 (×3): 81 mg via ORAL
  Filled 2015-11-14 (×3): qty 1

## 2015-11-14 MED ORDER — MORPHINE SULFATE ER 30 MG PO TBCR
30.0000 mg | EXTENDED_RELEASE_TABLET | Freq: Two times a day (BID) | ORAL | Status: DC
  Administered 2015-11-15 – 2015-11-17 (×6): 30 mg via ORAL
  Filled 2015-11-14 (×6): qty 1

## 2015-11-14 MED ORDER — POLYETHYLENE GLYCOL 3350 17 G PO PACK: 17.0000 g | PACK | Freq: Two times a day (BID) | ORAL | Status: DC | PRN

## 2015-11-14 MED ORDER — ONDANSETRON HCL 4 MG/2ML IJ SOLN: 4.0000 mg | Freq: Four times a day (QID) | INTRAMUSCULAR | Status: DC | PRN

## 2015-11-14 MED ORDER — ATORVASTATIN CALCIUM 40 MG PO TABS
40.0000 mg | ORAL_TABLET | Freq: Every evening | ORAL | Status: DC
  Administered 2015-11-15 – 2015-11-16 (×3): 40 mg via ORAL
  Filled 2015-11-14 (×3): qty 1

## 2015-11-14 MED ORDER — SENNOSIDES-DOCUSATE SODIUM 8.6-50 MG PO TABS: 2.0000 | ORAL_TABLET | Freq: Every day | ORAL | Status: DC | PRN

## 2015-11-14 MED ORDER — MAGNESIUM OXIDE 400 (241.3 MG) MG PO TABS
400.0000 mg | ORAL_TABLET | Freq: Every day | ORAL | Status: DC
  Administered 2015-11-15 – 2015-11-17 (×3): 400 mg via ORAL
  Filled 2015-11-14 (×3): qty 1

## 2015-11-14 MED ORDER — FERROUS SULFATE 325 (65 FE) MG PO TABS
325.0000 mg | ORAL_TABLET | Freq: Every day | ORAL | Status: DC
  Administered 2015-11-15 – 2015-11-17 (×3): 325 mg via ORAL
  Filled 2015-11-14 (×3): qty 1

## 2015-11-14 NOTE — ED Triage Notes (Addendum)
Pt reports increased swelling to entire body and SOB. Denies cough, fever. Pt with hx of lymphadema. Pt awake, alert, oriented x4, no acute distress noted.VSS. Lungs CTA.

## 2015-11-14 NOTE — Telephone Encounter (Signed)
Spoke with pt, she was seen last week and admission for chf recommended. She was unable to go at that time and calls today with continued issues with volume overload that has gotten worse. She is willing to go to the hospital now. Advised pt to go to the ER at [facility] cone. Patient voiced understanding

## 2015-11-14 NOTE — ED Notes (Signed)
Ambulated Pt in [place] without O2 while on Pulse Ox. Pt started at 97% on room air. While ambulating Pt in Hallway Pt's O2 dropped to 78% in hallway. Pt stated she did not feel short of breath while walking, but Pt's fingernail tips did turn more blue and O2 stat stayed steady in the low 80's%. Dr. Gilford Raid was present and aware of dropped O2. When hooked Pt back to Room Monitor Pt was still around 80%. Per Dr. Gilford Raid Pt placed on 3L nasal O2. Pt back in the bed and repositioned and hooked to Monitor.

## 2015-11-14 NOTE — Telephone Encounter (Signed)
New message    Pt c/o swelling: STAT is pt has developed SOB within 24 hours  1. How long have you been experiencing swelling? All her life/she has a condition  2. Where is the swelling located? Feet, leg, stomach, butt, breast  3.  Are you currently taking a "fluid pill"? yes  4.  Are you currently SOB? Yes   5.  Have you traveled recently?no
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
## 2015-11-14 NOTE — H&P (Addendum)
History and Physical    Victoria Fisher [id number] DOB: Jan 05, 1935 DOA: 11/14/2015  PCP: Haywood Pao, MD Consultants:  Jannifer Franklin - neurology; Lorrene Reid - neurology; C - cardiology Patient coming from: home - lives in a retirement community with her husband; NOK: Rubie Maid, [phone]  Chief Complaint: diffuse edema  HPI: Victoria Fisher is a 81 y.o. 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  "Just so very swollen.  Swollen everywhere, miserably swollen."  Hard to walk, hard to bend over, hard to get up and down.  Has Milroy syndrome, which causes lymphedema.  Generally just in her legs but this time "it has just gone crazy and is all over".  Buttocks, stomach, one breast much larger than the other.  Has been on diuretics for many years.  She was hospitalized in July and when she came out her PCP took her off of everything including diuretics.  Her doctors have recently resumed Lasix 40 daily and now BID.  No outward signs that this is helping.  Has voided only a little since she was given Lasix in the ER.  Mild SOB, especially at night while in bed.  Chronic constipation.  No chest pain.  +generalized weakness.  No falls in at least 3-4 weeks - but also not able to get around as well and so not as independent.  Bad reaction to gabapentin following last hospitalization, had edema of head/face, eyes shut, psychosis; was decreased on chronic morphine and so gabapentin added as an adjuinct.    ED Course: Given 40 mg IV Lasix with minimal result.  Ambulated patient and O2 sats dropped into mid-70s.  Placed on oxygen and TRH called for admission.  Review of Systems: As per HPI; otherwise 10 point review of systems reviewed and negative.   Ambulatory Status:  Difficulty ambulating now due to  diffuse edema  Past Medical History:  Diagnosis Date  . Abnormality of gait 01/30/2015  . Anemia in CKD (chronic kidney disease)   . Anxiety   . Arthritis   . Asthma    On nebulizer  . Atrial fibrillation, chronic ([place])    a. Rate controlled w/ BB. No OAC 2/2 falls and gait instability.  . Bowel obstruction ([place])    Caused by scar tissue  . Chronic cor pulmonale (HCC)    Most likely due to COPD (cannot rule out contribution of diastolic LV dysfunction). ECHO 07/30/11 pulmonary artery pressure was estimated to be 50-60% mmHg & there was moderate tricuspid insufficiency.  . Chronic diastolic CHF (congestive heart failure) ([place])    a. Predominantly right heart failure due to cor pulmonale & in turn this is most likely due to untreated OSA;  b. 05/2013 Echo: EF 55-60%, no rwma, mild to mod MR, sev dil LA, mod to sev dil RV w/ reduced systolic fxn, massively dil RA, mod-sev TR, PASP 74mmHg.  [name] Chronic pain syndrome   . Colon cancer ([place])    Carcinoma of sigmoid colon 1984 & 1985.  [name] COPD (chronic obstructive pulmonary disease) ([place])   . Coronary artery disease    a. occluded proximal diagonal artery on angiography 2004  . Depression   . Diverticulosis of colon   . 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 Dyspnea on exertion    Reluctant to take diuretics  . Edema of both  legs    Venous Doppler 05/11/12 was normal & no evidence of thrombus or thrombophlebitis. Chronic LE edema related to both cor pulmonale & lymphedema, hyperlipidemia & HTN involving coronary atherosclerosis by cardiac cath in 2004. Cath showed a tiny old occlusion of the optional diagonal branch & also proximal LAD.  [name] Frequent falls    Possibly due to orthostatic hypotension but we have not seen that on our visits. Recent fall 05/2012 with scalp hematoma, swollen left LE, & black eyes.  . Hyperlipidemia    On a statin. Catheterization 02/12/03 Showed a tiny old occlusion of the optional diagonal branch & also proximal LAD.  [name]  Hypertension   . Hypothyroidism 2002   On hormone replacement therapy  . IBS (irritable bowel syndrome)   . Insomnia   . Kidney failure 09/05/2015   Pt states that she went to the doctor on thursday and was told she is in stage 4 kidney failure  . Lung nodule   . Milroy's disease    Lymphatic disorder diagnosed at [facility]  . Obstructive sleep apnea    Polysomnogram 09/25/11 AHI: 53.6/hr overall & 57.1/hr during REM sleep. Poor compliance previously with CPAP  . Pulmonary hypertension ([place])    Pullmonary hypertension, chronic cor pulmonale w/pulmonary artery pressures of 50&#x2011; 60 mmHg.  . Tuberculosis     Past Surgical History:  Procedure Laterality Date  . ABDOMINAL HYSTERECTOMY  1983  . APPENDECTOMY  1956  . CARDIAC CATHETERIZATION  02/12/03   Showed a tiny old occlusion of the optional diagonal branch & also proximal LAD.  [name] CHOLECYSTECTOMY  1984  . COLON BIOPSY    . COLON RESECTION     2 times  . COLONOSCOPY  07/23/10    Social History   Social History  . Marital status: Married    Spouse name: Georgiann Mccoy  . Number of children: 3  . Years of education: HS   Occupational History  .  Retired   Social History Main Topics  . Smoking status: Never Smoker  . Smokeless tobacco: Never Used  . Alcohol use No  . Drug use: No  . Sexual activity: No   Other Topics Concern  . Not on file   Social History Narrative   Patient is married Georgiann Mccoy) and lives at home with her husband.   Patient is retired.   Patient has three children and her husband has two children.   Patient is right-handed.   Patient drinks very little caffeine (1/2 cup daily)   Patient has a high school education.    Allergies  Allergen Reactions  . Codeine Anaphylaxis, Hives and Nausea And Vomiting  . Gabapentin Swelling  . Ciprofloxacin Hives and Nausea And Vomiting  . Other     Will not accept blood or blood products - patient is Jehovah's Witness  . Latex Hives and Rash  . Penicillins Hives     Has patient had a PCN reaction causing immediate rash, facial/tongue/throat swelling, SOB or lightheadedness with hypotension: Yes Has patient had a PCN reaction causing severe rash involving mucus membranes or skin necrosis: Yes Has patient had a PCN reaction that required hospitalization Yes Has patient had a PCN reaction occurring within the last 10 years: Yes If all of the above answers are "NO", then may proceed with Cephalosporin use.  . Sulfonamide Derivatives Hives and Rash    Family History  Problem Relation Age of Onset  . Heart disease Father   . Alzheimer's disease Mother   .  Parkinsonism Brother   . Diabetes Brother     Prior to Admission medications   Medication Sig Start Date End Date Taking? Authorizing Provider  aspirin EC 81 MG tablet Take 81 mg by mouth every morning.     Historical Provider, MD  atorvastatin (LIPITOR) 40 MG tablet Take 40 mg by mouth every evening.     Historical Provider, MD  baclofen (LIORESAL) 10 MG tablet Take 0.5 tablets (5 mg total) by mouth 2 (two) times daily. 08/29/15   Kathrynn Ducking, MD  cephALEXin (KEFLEX) 500 MG capsule Take 1 capsule (500 mg total) by mouth 2 (two) times daily. 10/17/15   Erlene Quan, PA-C  cholecalciferol (VITAMIN D) 1000 UNITS tablet Take 2,000 Units by mouth daily with breakfast.     Historical Provider, MD  docusate sodium (COLACE) 100 MG capsule Take 100 mg by mouth 2 (two) times daily.    Historical Provider, MD  ferrous sulfate 325 (65 FE) MG tablet Take 325 mg by mouth daily.    Historical Provider, MD  furosemide (LASIX) 40 MG tablet Take 1 tablet (40 mg total) by mouth 2 (two) times daily. 11/07/15 05/05/16  Rogelia Mire, NP  levalbuterol [facility] HFA) 45 MCG/ACT inhaler Inhale 1 puff into the lungs every 4 (four) hours as needed for wheezing.    Historical Provider, MD  levothyroxine (SYNTHROID, LEVOTHROID) 112 MCG tablet Take 1 tablet (112 mcg total) by mouth every morning. Patient taking differently:  Take 112 mcg by mouth daily before breakfast.  06/30/13   Mihai Croitoru, MD  magnesium oxide (MAG-OX) 400 (241.3 MG) MG tablet Take 1 tablet (400 mg total) by mouth daily. 07/11/13   Haywood Pao, MD  metoprolol succinate (TOPROL-XL) 50 MG 24 hr tablet Take 50 mg by mouth daily. Take with or immediately following a meal.    Historical Provider, MD  morphine (MS CONTIN) 30 MG 12 hr tablet Take 1 tablet (30 mg total) by mouth 2 (two) times daily. 09/13/15   Allie Bossier, MD  morphine (MSIR) 15 MG tablet Take 1 tablet (15 mg total) by mouth 3 (three) times daily as needed (breakthrough pain). 09/13/15   Allie Bossier, MD  nitroGLYCERIN (NITROLINGUAL) 0.4 MG/SPRAY spray Place 1 spray under the tongue every 5 (five) minutes as needed for chest pain. 12/13/12   Mihai Croitoru, MD  polyethylene glycol (MIRALAX / GLYCOLAX) packet Take 17 g by mouth 2 (two) times daily as needed.    Historical Provider, MD  sennosides-docusate sodium (SENOKOT-S) 8.6-50 MG tablet Take 2-3 tablets by mouth daily as needed for constipation.    Historical Provider, MD  sertraline (ZOLOFT) 100 MG tablet TK 1 T PO D 09/20/15   Historical Provider, MD    Physical Exam: Vitals:   11/14/15 1900 11/14/15 1930 11/14/15 2003 11/14/15 2008  BP:  132/79  137/82  Pulse: 72 69  68  Resp: 20 15  14   Temp:    97.7 F (36.5 C)  TempSrc:    Oral  SpO2: 94% 93%  100%  Weight:   81.9 kg (180 lb 8.9 oz)   Height:   5' 4.8" (1.646 m)      General: Appears calm and comfortable and is NAD, very pleasant Eyes:  PERRL, EOMI, normal lids, iris ENT:  grossly normal hearing, lips & mmm; tongue has a painful 23mm ulceration on the right lateral margin and a healing ulcer on the left lateral margin Neck:  no LAD, masses or thyromegaly  Cardiovascular:  RRR, no m/r/g. No LE edema.  Respiratory:  CTA bilaterally, no w/r/r. Normal respiratory effort. Breasts: left breast with relative fullness and mild erythema compared to right but NT to  palpation Abdomen:  soft, nt, NABS, taut abdominal distention Skin: chronic venous stasis changes on B LE with elephantiasis skin changes along LE extending up into groin Musculoskeletal: marked 4+ pitting LE edema extending all the way up legs, impressive anasarca Psychiatric: grossly normal mood and affect, speech fluent and appropriate, AOx3 Neurologic:  CN 2-12 grossly intact, moves all extremities in coordinated fashion, sensation intact  Labs on Admission: I have personally reviewed following labs and imaging studies  CBC:  Recent Labs Lab 11/14/15 1324  WBC 4.5  NEUTROABS 2.7  HGB 11.2*  HCT 37.0  MCV 104.2*  PLT 0000000*   Basic Metabolic Panel:  Recent Labs Lab 11/14/15 1324 11/14/15 1439  NA 139  --   K 3.2*  --   CL 100*  --   CO2 30  --   GLUCOSE 89  --   BUN 32*  --   CREATININE 1.55* 1.40*  CALCIUM 9.6  --    GFR: Estimated Creatinine Clearance: 34.3 mL/min (by C-G formula based on SCr of 1.4 mg/dL (H)). Liver Function Tests:  Recent Labs Lab 11/14/15 1324  AST 19  ALT 10*  ALKPHOS 73  BILITOT 0.9  PROT 6.3*  ALBUMIN 3.5   No results for input(s): LIPASE, AMYLASE in the last 168 hours. No results for input(s): AMMONIA in the last 168 hours. Coagulation Profile: No results for input(s): INR, PROTIME in the last 168 hours. Cardiac Enzymes: No results for input(s): CKTOTAL, CKMB, CKMBINDEX, TROPONINI in the last 168 hours. BNP (last 3 results) No results for input(s): PROBNP in the last 8760 hours. HbA1C: No results for input(s): HGBA1C in the last 72 hours. CBG: No results for input(s): GLUCAP in the last 168 hours. Lipid Profile: No results for input(s): CHOL, HDL, LDLCALC, TRIG, CHOLHDL, LDLDIRECT in the last 72 hours. Thyroid Function Tests: No results for input(s): TSH, T4TOTAL, FREET4, T3FREE, THYROIDAB in the last 72 hours. Anemia Panel: No results for input(s): VITAMINB12, FOLATE, FERRITIN, TIBC, IRON, RETICCTPCT in the last 72  hours. Urine analysis:    Component Value Date/Time   COLORURINE YELLOW 09/09/2015 1808   APPEARANCEUR CLEAR 09/09/2015 1808   LABSPEC 1.013 09/09/2015 1808   PHURINE 5.5 09/09/2015 1808   GLUCOSEU NEGATIVE 09/09/2015 1808   HGBUR NEGATIVE 09/09/2015 1808   BILIRUBINUR NEGATIVE 09/09/2015 1808   KETONESUR NEGATIVE 09/09/2015 1808   PROTEINUR NEGATIVE 09/09/2015 1808   UROBILINOGEN 0.2 11/18/2014 0600   NITRITE NEGATIVE 09/09/2015 1808   LEUKOCYTESUR NEGATIVE 09/09/2015 1808    Creatinine Clearance: Estimated Creatinine Clearance: 34.3 mL/min (by C-G formula based on SCr of 1.4 mg/dL (H)).  Sepsis Labs: @LABRCNTIP (procalcitonin:4,lacticidven:4) )No results found for this or any previous visit (from the past 240 hour(s)).   Radiological Exams on Admission: Dg Chest 2 View  Result Date: 11/14/2015 CLINICAL DATA:  Shortness of breath and acute swelling over entire body past 3 weeks. Right heart failure. EXAM: CHEST  2 VIEW COMPARISON:  09/09/2015 FINDINGS: Lungs are somewhat hypoinflated demonstrate prominence of the perihilar markings which is worsened likely due mild interstitial edema. Mild linear density over the right base likely atelectasis. Moderate stable cardiomegaly. Calcified plaque over the aortic arch. There are degenerative changes of the spine. IMPRESSION: Cardiomegaly with findings suggesting mild interstitial edema. Mild right basilar atelectasis. Aortic atherosclerosis. Electronically Signed  By: Marin Olp M.D.   On: 11/14/2015 14:16    EKG: Independently reviewed.  Afib with rate 73; nonspecific ST changes with no evidence of acute ischemia  Assessment/Plan Principal Problem:   Lymphedema Active Problems:   Essential hypertension   MILROY'S DISEASE   CKD (chronic kidney disease), stage III   Chronic diastolic CHF (congestive heart failure) (HCC)   Chronic pain syndrome   Chronic cor pulmonale (HCC)   Atrial fibrillation, chronic (HCC)   Acute on chronic  respiratory failure (HCC)   Hypokalemia   Lymphedema/Milroy's disease -Patient with long-standing problems with this condition, currently with marked anasarca/lymphedema -Suspect that patient has exacerbation leading to current symptoms -However, problem is likely also exacerbated by CKD and lack of UOP -Patient d/w Dr. Florene Glen - will increase Lasix to 120 mg PO q8h overnight and monitor for diuresis -Will be seen in AM by nephrology -If this is unsuccessful in improving her problem, Lasix drip would be another consideration -She reports acute on chronic pain when the lymphedema is treated and starts to improve so will give IV morphine as an adjunct to home medications (see below)  CKD -Most recent visit in July indicated that patient now has Stage IV CKD -Suspect that this is contributing to current volume overload and that patient needs increasing doses of diuretics - her medications including diuretics were stopped in July and she is just titrating back up, suspect that she got behind and just couldn't catch back up. -Creatinine is actually better today, but suspect that it will increase with diuresis back to baseline -Nephrology to see patient in AM  Chronic diastolic CHF -Patient with elevated BNP, SOB, hypoxia, and known h/o CHF -If this is present, it should respond to the large doses of Lasix being given -However, her cardiovascular system seems currently less likely to be the culprit than her kidneys -She is scheduled for repeat Echo in 11/26/15; for now would not plan to change this to an inpatient study unless new concern arises  Acute on chronic respiratory failure -Patient has multifactorial disease that may lead to this issue but primarily CHF (diastolic) and cor pulmonale -Marked volume overload currently but mild pulmonary edema, seems unlikely that CHF is the primary cause of her hypoxia -Instead, cor pulmonale is more likely culprit -Will continue to monitor with  diuresis -Continue [place] O2  Chronic pain -I have reviewed this patient in the [place] Controlled Substances Reporting System.  She is receiving medications primarily from only one provider and appears to be taking them as prescribed. -Will continue home medications and add prn IV morphine for breakthrough pain.  Hyponatremia -Repleted -Will continue with BID replacement due to ongoing high-dose Lasix -When diuresis with Lasix ends, KCl supplementation needs to be reduced or discontinued  Afib -Chronic -Rate controlled on beta blocker -CHA2DS2-VASc score is 5, adjusted stroke rate 6.7%/year -Not on anticoagulation other than ASA due to h/o frequent falls  HTN -Stable on beta blockers (continued) -will follow   DVT prophylaxis: Lovenox Code Status: Full - confirmed with patient Family Communication: Friend present throughout evaluation Disposition Plan:  Home once clinically improved Consults called: Nephrology; may also need cardiology  Admission status: Admit to med surg    Karmen Bongo MD [facility]  If 7PM-7AM, please contact night-coverage [email or web address] Password [email or web address]  11/15/2015, 1:13 AM

## 2015-11-15 ENCOUNTER — Telehealth: Payer: Self-pay

## 2015-11-15 ENCOUNTER — Encounter (HOSPITAL_COMMUNITY): Payer: Self-pay | Admitting: Internal Medicine

## 2015-11-15 DIAGNOSIS — I890 Lymphedema, not elsewhere classified: Secondary | ICD-10-CM

## 2015-11-15 DIAGNOSIS — I482 Chronic atrial fibrillation: Secondary | ICD-10-CM

## 2015-11-15 DIAGNOSIS — I5032 Chronic diastolic (congestive) heart failure: Secondary | ICD-10-CM

## 2015-11-15 DIAGNOSIS — J9601 Acute respiratory failure with hypoxia: Secondary | ICD-10-CM | POA: Diagnosis present

## 2015-11-15 DIAGNOSIS — E876 Hypokalemia: Secondary | ICD-10-CM | POA: Diagnosis present

## 2015-11-15 LAB — CBC
HCT: 37 % (ref 36.0–46.0)
Hemoglobin: 11 g/dL — ABNORMAL LOW (ref 12.0–15.0)
MCH: 31.4 pg (ref 26.0–34.0)
MCHC: 29.7 g/dL — AB (ref 30.0–36.0)
MCV: 105.7 fL — ABNORMAL HIGH (ref 78.0–100.0)
PLATELETS: 116 10*3/uL — AB (ref 150–400)
RBC: 3.5 MIL/uL — ABNORMAL LOW (ref 3.87–5.11)
RDW: 15.4 % (ref 11.5–15.5)
WBC: 4.5 10*3/uL (ref 4.0–10.5)

## 2015-11-15 LAB — BASIC METABOLIC PANEL
Anion gap: 8 (ref 5–15)
BUN: 31 mg/dL — AB (ref 6–20)
CALCIUM: 9.5 mg/dL (ref 8.9–10.3)
CO2: 32 mmol/L (ref 22–32)
CREATININE: 1.48 mg/dL — AB (ref 0.44–1.00)
Chloride: 99 mmol/L — ABNORMAL LOW (ref 101–111)
GFR calc Af Amer: 38 mL/min — ABNORMAL LOW (ref >60)
GFR, EST NON AFRICAN AMERICAN: 32 mL/min — AB (ref >60)
GLUCOSE: 87 mg/dL (ref 65–99)
Potassium: 3 mmol/L — ABNORMAL LOW (ref 3.5–5.1)
SODIUM: 139 mmol/L (ref 135–145)

## 2015-11-15 LAB — URINALYSIS, ROUTINE W REFLEX MICROSCOPIC
Bilirubin Urine: NEGATIVE
Glucose, UA: NEGATIVE mg/dL
Hgb urine dipstick: NEGATIVE
KETONES UR: NEGATIVE mg/dL
LEUKOCYTES UA: NEGATIVE
NITRITE: NEGATIVE
PH: 6.5 (ref 5.0–8.0)
Protein, ur: NEGATIVE mg/dL
SPECIFIC GRAVITY, URINE: 1.007 (ref 1.005–1.030)

## 2015-11-15 LAB — CORTISOL: Cortisol, Plasma: 15.4 ug/dL

## 2015-11-15 LAB — IRON AND TIBC
Iron: 84 ug/dL (ref 28–170)
SATURATION RATIOS: 29 % (ref 10.4–31.8)
TIBC: 286 ug/dL (ref 250–450)
UIBC: 202 ug/dL

## 2015-11-15 LAB — TSH: TSH: 7.936 u[IU]/mL — AB (ref 0.350–4.500)

## 2015-11-15 MED ORDER — DARBEPOETIN ALFA 200 MCG/0.4ML IJ SOSY: 200.0000 ug | PREFILLED_SYRINGE | INTRAMUSCULAR | Status: DC

## 2015-11-15 MED ORDER — FUROSEMIDE 80 MG PO TABS
160.0000 mg | ORAL_TABLET | Freq: Three times a day (TID) | ORAL | Status: DC
  Administered 2015-11-15 – 2015-11-17 (×5): 160 mg via ORAL
  Filled 2015-11-15 (×6): qty 2

## 2015-11-15 MED ORDER — SERTRALINE HCL 100 MG PO TABS
100.0000 mg | ORAL_TABLET | Freq: Every day | ORAL | Status: DC
  Administered 2015-11-15 – 2015-11-17 (×3): 100 mg via ORAL
  Filled 2015-11-15 (×3): qty 1

## 2015-11-15 MED ORDER — METOPROLOL SUCCINATE ER 25 MG PO TB24
12.5000 mg | ORAL_TABLET | Freq: Every day | ORAL | Status: DC
  Administered 2015-11-17: 12.5 mg via ORAL
  Filled 2015-11-15 (×2): qty 1

## 2015-11-15 MED ORDER — FUROSEMIDE 10 MG/ML IJ SOLN: 80.0000 mg | Freq: Three times a day (TID) | INTRAMUSCULAR | Status: DC

## 2015-11-15 MED ORDER — DARBEPOETIN ALFA 100 MCG/0.5ML IJ SOSY
100.0000 ug | PREFILLED_SYRINGE | INTRAMUSCULAR | Status: DC
  Administered 2015-11-15: 100 ug via SUBCUTANEOUS
  Filled 2015-11-15: qty 0.5

## 2015-11-15 MED ORDER — POTASSIUM CHLORIDE CRYS ER 20 MEQ PO TBCR
40.0000 meq | EXTENDED_RELEASE_TABLET | Freq: Two times a day (BID) | ORAL | Status: DC
  Administered 2015-11-15: 40 meq via ORAL
  Filled 2015-11-15: qty 2

## 2015-11-15 MED ORDER — POTASSIUM CHLORIDE CRYS ER 20 MEQ PO TBCR
40.0000 meq | EXTENDED_RELEASE_TABLET | Freq: Three times a day (TID) | ORAL | Status: DC
  Administered 2015-11-15 (×3): 40 meq via ORAL
  Filled 2015-11-15 (×3): qty 2

## 2015-11-15 NOTE — Progress Notes (Signed)
PROGRESS NOTE    Victoria Fisher  [id number] DOB: [birth year] DOA: 11/14/2015 PCP: Haywood Pao, MD   Brief Narrative:  Victoria Fisher is a pleasant 81 year old female with a history of multiple comorbidities including lymphadenopathy, admitted to medicine service on 11/06/2015 presenting with anasarca. She had been on Lasix 40 mg by mouth twice a day. She was started on Lasix 120 mg IV 3 times a day, good urinary output. By the following morning she reported feeling much better.    Assessment & Plan:   Principal Problem:   Lymphedema Active Problems:   Essential hypertension   MILROY'S DISEASE   CKD (chronic kidney disease), stage III   Chronic diastolic CHF (congestive heart failure) (HCC)   Chronic pain syndrome   Chronic cor pulmonale (HCC)   Atrial fibrillation, chronic (HCC)   Acute on chronic respiratory failure (HCC)   Hypokalemia  1.  History of lymphedema -She presented with anasarca, Lasix was increased to 120 mg IV 3 times a day. -She reports feeling better this morning, although having blood pressure 110 55, will decrease Lasix dose to 80 mg IV 3 times a day, continue monitoring urinary output. -He has a history of Milroy's disease -Continue monitoring volumes status.  2.  Acute on chronic diastolic congestive heart failure -Last transthoracic echocardiogram performed 2015 that showed a preserved ejection fraction 55-60% -Presenting with volume overload, continue IV diuresis with Lasix at 80 mg IV 3 times a day  3.  Stage III chronic disease -Baseline creatinine near 1.3-1.5 -Labs showing creatinine 1.48 today, will continue monitoring her renal function with IV diuresis  4.  Hypokalemia -Labs showing potassium of 3.0, likely related to IV diuresis, will replace with potassium chloride 40 mEq by mouth x 2 doses  5. History of atrial fibrillation -CHADVasc score of 5 -Presently not on anticoagulation -Remains rate controlled, continue   DVT  prophylaxis: Lovenox Code Status: Full code Family Communication:  Disposition Plan: Continue IV diuresis  Consultants:     Procedures:     Antimicrobials:      Subjective: She reports feeling much better today, legs less swollen. Assisted out of bed to chair  Objective: Vitals:   11/15/15 0542 11/15/15 0641 11/15/15 0714 11/15/15 1036  BP: (!) 96/49 (!) 100/58 (!) 101/55 106/65  Pulse:  63 61 72  Resp:      Temp:      TempSrc:      SpO2:    100%  Weight:      Height:        Intake/Output Summary (Last 24 hours) at 11/15/15 1217 Last data filed at 11/15/15 0943  Gross per 24 hour  Intake              410 ml  Output             1800 ml  Net            -1390 ml   Filed Weights   11/14/15 2003  Weight: 81.9 kg (180 lb 8.9 oz)    Examination:  General exam: Appears calm and comfortable, she is in no acute distress   Respiratory system: Clear to auscultation. Respiratory effort normal. Cardiovascular system: S1 & S2 heard, RRR. No JVD, murmurs, rubs, gallops or clicks. There is 2+ bilateral extremity pitting edema Gastrointestinal system: Abdomen is nondistended, soft and nontender. No organomegaly or masses felt. Normal bowel sounds heard. Central nervous system: Alert and oriented. No focal neurological deficits. Extremities: Symmetric 5 x  5 power. Skin: No rashes, lesions or ulcers Psychiatry: Judgement and insight appear normal. Mood & affect appropriate.     Data Reviewed: I have personally reviewed following labs and imaging studies  CBC:  Recent Labs Lab 11/14/15 1324 11/15/15 0511  WBC 4.5 4.5  NEUTROABS 2.7  --   HGB 11.2* 11.0*  HCT 37.0 37.0  MCV 104.2* 105.7*  PLT 109* [id number]*   Basic Metabolic Panel:  Recent Labs Lab 11/14/15 1324 11/14/15 1439 11/15/15 0511  NA 139  --  139  K 3.2*  --  3.0*  CL 100*  --  99*  CO2 30  --  32  GLUCOSE 89  --  87  BUN 32*  --  31*  CREATININE 1.55* 1.40* 1.48*  CALCIUM 9.6  --  9.5    GFR: Estimated Creatinine Clearance: 32.5 mL/min (by C-G formula based on SCr of 1.48 mg/dL (H)). Liver Function Tests:  Recent Labs Lab 11/14/15 1324  AST 19  ALT 10*  ALKPHOS 73  BILITOT 0.9  PROT 6.3*  ALBUMIN 3.5   No results for input(s): LIPASE, AMYLASE in the last 168 hours. No results for input(s): AMMONIA in the last 168 hours. Coagulation Profile: No results for input(s): INR, PROTIME in the last 168 hours. Cardiac Enzymes: No results for input(s): CKTOTAL, CKMB, CKMBINDEX, TROPONINI in the last 168 hours. BNP (last 3 results) No results for input(s): PROBNP in the last 8760 hours. HbA1C: No results for input(s): HGBA1C in the last 72 hours. CBG: No results for input(s): GLUCAP in the last 168 hours. Lipid Profile: No results for input(s): CHOL, HDL, LDLCALC, TRIG, CHOLHDL, LDLDIRECT in the last 72 hours. Thyroid Function Tests: No results for input(s): TSH, T4TOTAL, FREET4, T3FREE, THYROIDAB in the last 72 hours. Anemia Panel: No results for input(s): VITAMINB12, FOLATE, FERRITIN, TIBC, IRON, RETICCTPCT in the last 72 hours. Sepsis Labs: No results for input(s): PROCALCITON, LATICACIDVEN in the last 168 hours.  No results found for this or any previous visit (from the past 240 hour(s)).       Radiology Studies: Dg Chest 2 View  Result Date: 11/14/2015 CLINICAL DATA:  Shortness of breath and acute swelling over entire body past 3 weeks. Right heart failure. EXAM: CHEST  2 VIEW COMPARISON:  09/09/2015 FINDINGS: Lungs are somewhat hypoinflated demonstrate prominence of the perihilar markings which is worsened likely due mild interstitial edema. Mild linear density over the right base likely atelectasis. Moderate stable cardiomegaly. Calcified plaque over the aortic arch. There are degenerative changes of the spine. IMPRESSION: Cardiomegaly with findings suggesting mild interstitial edema. Mild right basilar atelectasis. Aortic atherosclerosis. Electronically  Signed   By: Marin Olp M.D.   On: 11/14/2015 14:16        Scheduled Meds: . aspirin EC  81 mg Oral q morning - 10a  . atorvastatin  40 mg Oral QPM  . baclofen  5 mg Oral BID  . darbepoetin (ARANESP) injection - NON-DIALYSIS  100 mcg Subcutaneous Q Fri-1800  . docusate sodium  100 mg Oral BID  . enoxaparin (LOVENOX) injection  30 mg Subcutaneous Q24H  . ferrous sulfate  325 mg Oral Daily  . furosemide  160 mg Oral TID  . levothyroxine  112 mcg Oral QAC breakfast  . magnesium oxide  400 mg Oral Daily  . [START ON 11/16/2015] metoprolol succinate  12.5 mg Oral Daily  . morphine  30 mg Oral BID  . potassium chloride  40 mEq Oral TID   Continuous  Infusions:    LOS: 1 day    Time spent: 35 min    Kelvin Cellar, MD [facility] Pager [phone]  If 7PM-7AM, please contact night-coverage [email or web address] Password [facility] 11/15/2015, 12:17 PM

## 2015-11-15 NOTE — Care Management Note (Addendum)
Case Management Note  Patient Details  Name: KATHYRN LONSINGER MRN: [id number] Date of Birth: [birth year]  Subjective/Objective:                 Patient from [place] with husband. Verifying patient is active with [facility], noted referral made early September by PCP. Patient with COPD diuresing watch Cr with CKD. On oxygen currently, no home O2.    Action/Plan:  Watch for home O2. Patient is active w/ [facility] for [facility] RN PT.   Expected Discharge Date:                  Expected Discharge Plan:  [place]  In-House Referral:  NA  Discharge planning Services  CM Consult  Post Acute Care Choice:  [place], Resumption of Svcs/PTA Provider ([facility]) Choice offered to:  Patient  DME Arranged:  N/A DME Agency:  NA  HH Arranged:  PT HH Agency:  [facility]  Status of Service:  In process, will continue to follow  If discussed at Long Length of Stay Meetings, dates discussed:    Additional Comments:  Carles Collet, RN 11/15/2015, 11:04 AM

## 2015-11-15 NOTE — Consult Note (Signed)
Reason for Consult:CKD 3-4, vol overload Referring Physician: Dr. Tedd Sias is an 81 y.o. female.  HPI: 81 yr female with hx of Milroy' syndrome (underdevel of lymphatics), hx CAD with MI, HT in past, asthma, severe weakness with falls, afib in past (off anticoag with falls) , COPd, Pulm HTN, divetic dz, , gait abn, who presents with progressive edema over 3 wk.  Has seen Dr. Lorrene Reid 7/17 iniatially, and f/u 8/17.  Had low bps with edema and Cr of 2.5, so lisinopril stopped. Cr dropped to 1.5-1.8.  Now swollen all over,worse than before on 11m Lasix only.  Denies CP but SOB with minimal exertion.  No NSAIDs.  Some burning on urination. Noct x 2-3 , no hematuria..  No V, V, D, fevers or chills Constitutional: weak, swollen Eyes: neg, except glasses Ears, nose, mouth, throat, and face: sore on tongue Respiratory: the edema Cardiovascular: as above Gastrointestinal: negative Genitourinary:as above Integument/breast: negative Hematologic/lymphatic: anemic on esa Musculoskeletal:negative Neurological: head drop, falls, weakness Allergic/Immunologic: Gaba, codeine, cipor, PCN, sulfa  Primary Nephrologist DLorrene Reid .   Past Medical History:  Diagnosis Date  . Abnormality of gait 01/30/2015  . Anemia in CKD (chronic kidney disease)   . Anxiety   . Arthritis   . Asthma    On nebulizer  . Atrial fibrillation, chronic ([place]    a. Rate controlled w/ BB. No OAC 2/2 falls and gait instability.  . Bowel obstruction ([place]    Caused by scar tissue  . Chronic cor pulmonale (HCC)    Most likely due to COPD (cannot rule out contribution of diastolic LV dysfunction). ECHO 07/30/11 pulmonary artery pressure was estimated to be 50-60% mmHg & there was moderate tricuspid insufficiency.  . Chronic diastolic CHF (congestive heart failure) ([place]    a. Predominantly right heart failure due to cor pulmonale & in turn this is most likely due to untreated OSA;  b. 05/2013 Echo: EF 55-60%, no rwma,  mild to mod MR, sev dil LA, mod to sev dil RV w/ reduced systolic fxn, massively dil RA, mod-sev TR, PASP 327mg.  . Marland Kitchenhronic pain syndrome   . Colon cancer ([facility]   Carcinoma of sigmoid colon 1984 & 1985.  . Marland KitchenOPD (chronic obstructive pulmonary disease) ([facility]  . Coronary artery disease    a. occluded proximal diagonal artery on angiography 2004  . Depression   . Diverticulosis of colon   . 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 Dyspnea on exertion    Reluctant to take diuretics  . Edema of both legs    Venous Doppler 05/11/12 was normal & no evidence of thrombus or thrombophlebitis. Chronic LE edema related to both cor pulmonale & lymphedema, hyperlipidemia & HTN involving coronary atherosclerosis by cardiac cath in 2004. Cath showed a tiny old occlusion of the optional diagonal branch & also proximal LAD.  . Marland Kitchenrequent falls    Possibly due to orthostatic hypotension but we have not seen that on our visits. Recent fall 05/2012 with scalp hematoma, swollen left LE, & black eyes.  . Hyperlipidemia    On a statin. Catheterization 02/12/03 Showed a tiny old occlusion of the optional diagonal branch & also proximal LAD.  . Marland Kitchenypertension   . Hypothyroidism 2002   On hormone replacement therapy  . IBS (irritable bowel syndrome)   . Insomnia   . Kidney failure 09/05/2015   Pt states that she went to the doctor on thursday and was told she is in stage 4 kidney  failure  . Lung nodule   . Milroy's disease    Lymphatic disorder [facility] at [facility]  . Obstructive sleep apnea    Polysomnogram 09/25/11 AHI: 53.6/hr overall & 57.1/hr during REM sleep. Poor compliance previously with CPAP  . Pulmonary hypertension ([place])    Pullmonary hypertension, chronic cor pulmonale w/pulmonary artery pressures of 50&#x2011; 60 mmHg.  . Tuberculosis     Past Surgical History:  Procedure Laterality Date  . ABDOMINAL HYSTERECTOMY  1983  . APPENDECTOMY  1956  . CARDIAC CATHETERIZATION  02/12/03   Showed a tiny old  occlusion of the optional diagonal branch & also proximal LAD.  [name] CHOLECYSTECTOMY  1984  . COLON BIOPSY    . COLON RESECTION     2 times  . COLONOSCOPY  07/23/10    Family History  Problem Relation Age of Onset  . Heart disease Father   . Alzheimer's disease Mother   . Parkinsonism Brother   . Diabetes Brother     Social History:  reports that she has never smoked. She has never used smokeless tobacco. She reports that she does not drink alcohol or use drugs.  Allergies:  Allergies  Allergen Reactions  . Codeine Anaphylaxis, Hives and Nausea And Vomiting  . Gabapentin Swelling  . Ciprofloxacin Hives and Nausea And Vomiting  . Other     Will not accept blood or blood products - patient is Jehovah's Witness  . Latex Hives and Rash  . Penicillins Hives    Has patient had a PCN reaction causing immediate rash, facial/tongue/throat swelling, SOB or lightheadedness with hypotension: Yes Has patient had a PCN reaction causing severe rash involving mucus membranes or skin necrosis: Yes Has patient had a PCN reaction that required hospitalization Yes Has patient had a PCN reaction occurring within the last 10 years: Yes If all of the above answers are "NO", then may proceed with Cephalosporin use.  . Sulfonamide Derivatives Hives and Rash    Medications:  I have reviewed the patient's current medications. Prior to Admission:  Prescriptions Prior to Admission  Medication Sig Dispense Refill Last Dose  . aspirin EC 81 MG tablet Take 81 mg by mouth every morning.    Taking  . atorvastatin (LIPITOR) 40 MG tablet Take 40 mg by mouth every evening.    Taking  . baclofen (LIORESAL) 10 MG tablet Take 0.5 tablets (5 mg total) by mouth 2 (two) times daily. 30 each 3 Taking  . cephALEXin (KEFLEX) 500 MG capsule Take 1 capsule (500 mg total) by mouth 2 (two) times daily. 14 capsule 0 Taking  . cholecalciferol (VITAMIN D) 1000 UNITS tablet Take 2,000 Units by mouth daily with breakfast.     Taking  . docusate sodium (COLACE) 100 MG capsule Take 100 mg by mouth 2 (two) times daily.   Taking  . ferrous sulfate 325 (65 FE) MG tablet Take 325 mg by mouth daily.   Taking  . furosemide (LASIX) 40 MG tablet Take 1 tablet (40 mg total) by mouth 2 (two) times daily. 90 tablet 3   . levalbuterol (XOPENEX HFA) 45 MCG/ACT inhaler Inhale 1 puff into the lungs every 4 (four) hours as needed for wheezing.   Taking  . levothyroxine (SYNTHROID, LEVOTHROID) 112 MCG tablet Take 1 tablet (112 mcg total) by mouth every morning. (Patient taking differently: Take 112 mcg by mouth daily before breakfast. ) 30 tablet 6 Taking  . magnesium oxide (MAG-OX) 400 (241.3 MG) MG tablet Take 1 tablet (400 mg total)  by mouth daily. 30 tablet 5 Taking  . metoprolol succinate (TOPROL-XL) 50 MG 24 hr tablet Take 50 mg by mouth daily. Take with or immediately following a meal.   Taking  . morphine (MS CONTIN) 30 MG 12 hr tablet Take 1 tablet (30 mg total) by mouth 2 (two) times daily. 8 tablet 0 Taking  . morphine (MSIR) 15 MG tablet Take 1 tablet (15 mg total) by mouth 3 (three) times daily as needed (breakthrough pain). 8 tablet 0 Taking  . nitroGLYCERIN (NITROLINGUAL) 0.4 MG/SPRAY spray Place 1 spray under the tongue every 5 (five) minutes as needed for chest pain. 12 g 1 Taking  . polyethylene glycol (MIRALAX / GLYCOLAX) packet Take 17 g by mouth 2 (two) times daily as needed.   Taking  . sennosides-docusate sodium (SENOKOT-S) 8.6-50 MG tablet Take 2-3 tablets by mouth daily as needed for constipation.   Taking  . sertraline (ZOLOFT) 100 MG tablet TK 1 T PO D   Taking   Aranesp 165mg/mon.  Results for orders placed or performed during the hospital encounter of 11/14/15 (from the past 48 hour(s))  CBC with Differential     Status: Abnormal   Collection Time: 11/14/15  1:24 PM  Result Value Ref Range   WBC 4.5 4.0 - 10.5 K/uL   RBC 3.55 (L) 3.87 - 5.11 MIL/uL   Hemoglobin 11.2 (L) 12.0 - 15.0 g/dL   HCT 37.0 36.0  - 46.0 %   MCV 104.2 (H) 78.0 - 100.0 fL   MCH 31.5 26.0 - 34.0 pg   MCHC 30.3 30.0 - 36.0 g/dL   RDW 15.0 11.5 - 15.5 %   Platelets 109 (L) 150 - 400 K/uL    Comment: PLATELET COUNT CONFIRMED BY SMEAR REPEATED TO VERIFY SPECIMEN CHECKED FOR CLOTS    Neutrophils Relative % 61 %   Neutro Abs 2.7 1.7 - 7.7 K/uL   Lymphocytes Relative 25 %   Lymphs Abs 1.1 0.7 - 4.0 K/uL   Monocytes Relative 11 %   Monocytes Absolute 0.5 0.1 - 1.0 K/uL   Eosinophils Relative 3 %   Eosinophils Absolute 0.1 0.0 - 0.7 K/uL   Basophils Relative 1 %   Basophils Absolute 0.0 0.0 - 0.1 K/uL  Comprehensive metabolic panel     Status: Abnormal   Collection Time: 11/14/15  1:24 PM  Result Value Ref Range   Sodium 139 135 - 145 mmol/L   Potassium 3.2 (L) 3.5 - 5.1 mmol/L   Chloride 100 (L) 101 - 111 mmol/L   CO2 30 22 - 32 mmol/L   Glucose, Bld 89 65 - 99 mg/dL   BUN 32 (H) 6 - 20 mg/dL   Creatinine, Ser 1.55 (H) 0.44 - 1.00 mg/dL   Calcium 9.6 8.9 - 10.3 mg/dL   Total Protein 6.3 (L) 6.5 - 8.1 g/dL   Albumin 3.5 3.5 - 5.0 g/dL   AST 19 15 - 41 U/L   ALT 10 (L) 14 - 54 U/L   Alkaline Phosphatase 73 38 - 126 U/L   Total Bilirubin 0.9 0.3 - 1.2 mg/dL   GFR calc non Af Amer 31 (L) >60 mL/min   GFR calc Af Amer 36 (L) >60 mL/min    Comment: (NOTE) The eGFR has been calculated using the CKD EPI equation. This calculation has not been validated in all clinical situations. eGFR's persistently <60 mL/min signify possible Chronic Kidney Disease.    Anion gap 9 5 - 15  Brain natriuretic peptide  Status: Abnormal   Collection Time: 11/14/15  1:25 PM  Result Value Ref Range   B Natriuretic Peptide 451.8 (H) 0.0 - 100.0 pg/mL  I-Stat Creatinine, ED (do not order at [facility])     Status: Abnormal   Collection Time: 11/14/15  2:39 PM  Result Value Ref Range   Creatinine, Ser 1.40 (H) 0.44 - 1.00 mg/dL  Basic metabolic panel     Status: Abnormal   Collection Time: 11/15/15  5:11 AM  Result Value Ref Range    Sodium 139 135 - 145 mmol/L   Potassium 3.0 (L) 3.5 - 5.1 mmol/L   Chloride 99 (L) 101 - 111 mmol/L   CO2 32 22 - 32 mmol/L   Glucose, Bld 87 65 - 99 mg/dL   BUN 31 (H) 6 - 20 mg/dL   Creatinine, Ser 1.48 (H) 0.44 - 1.00 mg/dL   Calcium 9.5 8.9 - 10.3 mg/dL   GFR calc non Af Amer 32 (L) >60 mL/min   GFR calc Af Amer 38 (L) >60 mL/min    Comment: (NOTE) The eGFR has been calculated using the CKD EPI equation. This calculation has not been validated in all clinical situations. eGFR's persistently <60 mL/min signify possible Chronic Kidney Disease.    Anion gap 8 5 - 15  CBC     Status: Abnormal   Collection Time: 11/15/15  5:11 AM  Result Value Ref Range   WBC 4.5 4.0 - 10.5 K/uL    Comment: CONSISTENT WITH PREVIOUS RESULT   RBC 3.50 (L) 3.87 - 5.11 MIL/uL   Hemoglobin 11.0 (L) 12.0 - 15.0 g/dL    Comment: CONSISTENT WITH PREVIOUS RESULT   HCT 37.0 36.0 - 46.0 %   MCV 105.7 (H) 78.0 - 100.0 fL   MCH 31.4 26.0 - 34.0 pg   MCHC 29.7 (L) 30.0 - 36.0 g/dL   RDW 15.4 11.5 - 15.5 %   Platelets 116 (L) 150 - 400 K/uL    Comment: CONSISTENT WITH PREVIOUS RESULT    Dg Chest 2 View  Result Date: 11/14/2015 CLINICAL DATA:  Shortness of breath and acute swelling over entire body past 3 weeks. Right heart failure. EXAM: CHEST  2 VIEW COMPARISON:  09/09/2015 FINDINGS: Lungs are somewhat hypoinflated demonstrate prominence of the perihilar markings which is worsened likely due mild interstitial edema. Mild linear density over the right base likely atelectasis. Moderate stable cardiomegaly. Calcified plaque over the aortic arch. There are degenerative changes of the spine. IMPRESSION: Cardiomegaly with findings suggesting mild interstitial edema. Mild right basilar atelectasis. Aortic atherosclerosis. Electronically Signed   By: Marin Olp M.D.   On: 11/14/2015 14:16    ROS Blood pressure 106/65, pulse 72, temperature 97.7 F (36.5 C), temperature source Oral, resp. rate 16, height 5' 4.8"  (1.646 m), weight 81.9 kg (180 lb 8.9 oz), SpO2 100 %. Physical Exam Physical Examination: General appearance - head tilted, pleasant, chronically ill Mental status - alert, oriented to person, place, and time Eyes - pupils equal and reactive, extraocular eye movements intact Mouth - mucous membranes moist, pharynx normal without lesions Neck - adenopathy noted PCL, tilted to L Lymphatics - posterior cervical nodes Chest - decreased bs, rale in bases Heart - S1 and S2 normal, systolic murmur LZ7/6 at 2nd left intercostal space Abdomen - distended, abdm wall edema, pos bs, liver down 5 cm Extremities - intact peripheral pulses, 4+ edema, some brawny, some pitting,  Skin - pale , chronic changes LE  Assessment/Plan: 1 CKD 3-4  severe vol xs. Some chronic,some subacute.  Subtherapeutic doses Lasix, no indic for iv.  Bp limiting, will lower Metop, w/u for other causes low bp.  May need mido 2 Milroys 3 Hypertension: not an issue, actually hypotensio 4. Anemia eval Fe, cont esa 5. Metabolic Bone Disease: check PTH 6 Hx CAD 7 Falls 8 Afib  P po lasix, replete k, check UA, TSH, cortisol, lower metop  Milica Gully L 11/15/2015, 12:03 PM

## 2015-11-15 NOTE — Telephone Encounter (Signed)
Signed orders for furosemide instructions faxed to [place] at [facility] from [place].

## 2015-11-16 LAB — RENAL FUNCTION PANEL
ALBUMIN: 3.3 g/dL — AB (ref 3.5–5.0)
ANION GAP: 5 (ref 5–15)
BUN: 32 mg/dL — ABNORMAL HIGH (ref 6–20)
CALCIUM: 9.6 mg/dL (ref 8.9–10.3)
CO2: 34 mmol/L — ABNORMAL HIGH (ref 22–32)
Chloride: 101 mmol/L (ref 101–111)
Creatinine, Ser: 1.49 mg/dL — ABNORMAL HIGH (ref 0.44–1.00)
GFR calc non Af Amer: 32 mL/min — ABNORMAL LOW (ref >60)
GFR, EST AFRICAN AMERICAN: 37 mL/min — AB (ref >60)
GLUCOSE: 97 mg/dL (ref 65–99)
PHOSPHORUS: 2.7 mg/dL (ref 2.5–4.6)
POTASSIUM: 4.6 mmol/L (ref 3.5–5.1)
SODIUM: 140 mmol/L (ref 135–145)

## 2015-11-16 LAB — PARATHYROID HORMONE, INTACT (NO CA): PTH: 185 pg/mL — ABNORMAL HIGH (ref 15–65)

## 2015-11-16 MED ORDER — SODIUM CHLORIDE 0.9 % IV SOLN
510.0000 mg | Freq: Once | INTRAVENOUS | Status: AC
  Administered 2015-11-16: 510 mg via INTRAVENOUS
  Filled 2015-11-16 (×2): qty 17

## 2015-11-16 MED ORDER — POTASSIUM CHLORIDE CRYS ER 20 MEQ PO TBCR
40.0000 meq | EXTENDED_RELEASE_TABLET | Freq: Every day | ORAL | Status: DC
  Administered 2015-11-16 – 2015-11-17 (×2): 40 meq via ORAL
  Filled 2015-11-16 (×2): qty 2

## 2015-11-16 MED ORDER — CALCITRIOL 0.25 MCG PO CAPS
0.2500 ug | ORAL_CAPSULE | Freq: Every day | ORAL | Status: DC
  Administered 2015-11-16 – 2015-11-17 (×2): 0.25 ug via ORAL
  Filled 2015-11-16 (×2): qty 1

## 2015-11-16 NOTE — Progress Notes (Signed)
Subjective: Interval History: has no complaint, but wants to go home.  Objective: Vital signs in last 24 hours: Temp:  [97.5 F (36.4 C)-97.9 F (36.6 C)] 97.5 F (36.4 C) (09/30 0521) Pulse Rate:  [43-72] 63 (09/30 0521) Resp:  [17-18] 17 (09/30 0521) BP: (100-118)/(65-85) 106/66 (09/30 0521) SpO2:  [96 %-100 %] 96 % (09/30 0521) Weight change:   Intake/Output from previous day: 09/29 0701 - 09/30 0700 In: 640 [P.O.:640] Out: 2700 [Urine:2700] Intake/Output this shift: No intake/output data recorded.  General appearance: alert, cooperative and no distress Neck: tilted to Fisher Resp: diminished breath sounds bilaterally and rales bibasilar Cardio: S1, S2 normal and systolic murmur: holosystolic 2/6, blowing at apex GI: abdm wall edema, pos bs, mod distension Extremities: edema 4+ some brawny some pitting  Lab Results:  Recent Labs  11/14/15 1324 11/15/15 0511  WBC 4.5 4.5  HGB 11.2* 11.0*  HCT 37.0 37.0  PLT 109* 116*   BMET:  Recent Labs  11/15/15 0511 11/16/15 0619  NA 139 140  K 3.0* 4.6  CL 99* 101  CO2 32 34*  GLUCOSE 87 97  BUN 31* 32*  CREATININE 1.48* 1.49*  CALCIUM 9.5 9.6    Recent Labs  11/15/15 1208  PTH 185*   Iron Studies:  Recent Labs  11/15/15 1208  IRON 84  TIBC 286    Studies/Results: Dg Chest 2 View  Result Date: 11/14/2015 CLINICAL DATA:  Shortness of breath and acute swelling over entire body past 3 weeks. Right heart failure. EXAM: CHEST  2 VIEW COMPARISON:  09/09/2015 FINDINGS: Lungs are somewhat hypoinflated demonstrate prominence of the perihilar markings which is worsened likely due mild interstitial edema. Mild linear density over the right base likely atelectasis. Moderate stable cardiomegaly. Calcified plaque over the aortic arch. There are degenerative changes of the spine. IMPRESSION: Cardiomegaly with findings suggesting mild interstitial edema. Mild right basilar atelectasis. Aortic atherosclerosis. Electronically  Signed   By: Marin Olp M.D.   On: 11/14/2015 14:16    I have reviewed the patient's current medications.  Assessment/Plan: 1 CKD3-4  Diuresing on po Lasix, there was no indic for IV diuretics.  K ok, lower dose 2 Anemia esa given, give 1 dose iv Fe 3 HPTH needs vit D 4 Milroys 5 severe debill 6 pulm htn 7 low bps P po lasix, daily k, feraheme, esa, vit D    LOS: 2 days   Victoria Fisher 11/16/2015,9:35 AM

## 2015-11-16 NOTE — Progress Notes (Signed)
PROGRESS NOTE    Victoria Fisher  [id number] DOB: [birth year] DOA: 11/14/2015 PCP: Victoria Pao, MD   Brief Narrative:  Victoria Fisher is a pleasant 81 year old female with a history of multiple comorbidities including lymphadenopathy, admitted to medicine service on 11/06/2015 presenting with anasarca. She had been on Lasix 40 mg by mouth twice a day. She was started on Lasix 120 mg IV 3 times a day, good urinary output. By the following morning she reported feeling much better.    Assessment & Plan:   Principal Problem:   Lymphedema Active Problems:   Essential hypertension   MILROY'S DISEASE   CKD (chronic kidney disease), stage III   Chronic diastolic CHF (congestive heart failure) (HCC)   Chronic pain syndrome   Chronic cor pulmonale (HCC)   Atrial fibrillation, chronic (HCC)   Acute on chronic respiratory failure (HCC)   Hypokalemia  1.  History of lymphedema -She presented with anasarca, Lasix was increased to 120 mg IV 3 times a day. -She reports feeling better this morning, although having blood pressure 110 55, will decrease Lasix dose to 80 mg IV 3 times a day, continue monitoring urinary output. -He has a history of Milroy's disease -On 11/16/2015 continues to have significant edema on lower extremities, not quite at her baseline though wants to go home today. After talking with her she is agreeable to staying one more day for IV diuresis, plan to discharge in am if she continues to show improvement. Renal function stable this am, Cr = 1.49, will continue lasix 80 mg IV TID. Has a net neg fluid balance of 3.19 L  2.  Acute on chronic diastolic congestive heart failure -Last transthoracic echocardiogram performed 2015 that showed a preserved ejection fraction 55-60% -Presenting with volume overload, continue IV diuresis with Lasix at 80 mg IV 3 times a day  3.  Stage III chronic disease -Baseline creatinine near 1.3-1.5 -Labs showing creatinine 1.48 today, will  continue monitoring her renal function with IV diuresis  4.  Hypokalemia -Labs showing potassium of 3.0, likely related to IV diuresis, will replace with potassium chloride 40 mEq by mouth x 2 doses -On 11/16/2015 labs showing improved K of 4.6  5. History of atrial fibrillation -CHADVasc score of 5 -Presently not on anticoagulation -Remains rate controlled, continue   DVT prophylaxis: Lovenox Code Status: Full code Family Communication:  Disposition Plan: Continue IV diuresis  Consultants:     Procedures:     Antimicrobials:      Subjective: She was ambulated down the hallway with a walker. Wants to go home. States feeling better  Objective: Vitals:   11/15/15 2139 11/15/15 2246 11/15/15 2359 11/16/15 0521  BP: 100/85 118/74 108/68 106/66  Pulse: (!) 43 62 (!) 59 63  Resp: 18   17  Temp:    97.5 F (36.4 C)  TempSrc:    Oral  SpO2: 100%   96%  Weight:      Height:        Intake/Output Summary (Last 24 hours) at 11/16/15 0937 Last data filed at 11/16/15 0629  Gross per 24 hour  Intake              640 ml  Output             2700 ml  Net            -2060 ml   Filed Weights   11/14/15 2003  Weight: 81.9 kg (180 lb 8.9 oz)  Examination:  General exam: Appears calm and comfortable, she is in no acute distress   Respiratory system: Clear to auscultation. Respiratory effort normal. Cardiovascular system: S1 & S2 heard, RRR. No JVD, murmurs, rubs, gallops or clicks. There is 2+ bilateral extremity pitting edema, little change from yesterday's exam.  Gastrointestinal system: Abdomen is nondistended, soft and nontender. No organomegaly or masses felt. Normal bowel sounds heard. Central nervous system: Alert and oriented. No focal neurological deficits. Extremities: Symmetric 5 x 5 power. Skin: No rashes, lesions or ulcers Psychiatry: Judgement and insight appear normal. Mood & affect appropriate.     Data Reviewed: I have personally reviewed following  labs and imaging studies  CBC:  Recent Labs Lab 11/14/15 1324 11/15/15 0511  WBC 4.5 4.5  NEUTROABS 2.7  --   HGB 11.2* 11.0*  HCT 37.0 37.0  MCV 104.2* 105.7*  PLT 109* [id number]*   Basic Metabolic Panel:  Recent Labs Lab 11/14/15 1324 11/14/15 1439 11/15/15 0511 11/16/15 0619  NA 139  --  139 140  K 3.2*  --  3.0* 4.6  CL 100*  --  99* 101  CO2 30  --  32 34*  GLUCOSE 89  --  87 97  BUN 32*  --  31* 32*  CREATININE 1.55* 1.40* 1.48* 1.49*  CALCIUM 9.6  --  9.5 9.6  PHOS  --   --   --  2.7   GFR: Estimated Creatinine Clearance: 32.2 mL/min (by C-G formula based on SCr of 1.49 mg/dL (H)). Liver Function Tests:  Recent Labs Lab 11/14/15 1324 11/16/15 0619  AST 19  --   ALT 10*  --   ALKPHOS 73  --   BILITOT 0.9  --   PROT 6.3*  --   ALBUMIN 3.5 3.3*   No results for input(s): LIPASE, AMYLASE in the last 168 hours. No results for input(s): AMMONIA in the last 168 hours. Coagulation Profile: No results for input(s): INR, PROTIME in the last 168 hours. Cardiac Enzymes: No results for input(s): CKTOTAL, CKMB, CKMBINDEX, TROPONINI in the last 168 hours. BNP (last 3 results) No results for input(s): PROBNP in the last 8760 hours. HbA1C: No results for input(s): HGBA1C in the last 72 hours. CBG: No results for input(s): GLUCAP in the last 168 hours. Lipid Profile: No results for input(s): CHOL, HDL, LDLCALC, TRIG, CHOLHDL, LDLDIRECT in the last 72 hours. Thyroid Function Tests:  Recent Labs  11/15/15 1208  TSH 7.936*   Anemia Panel:  Recent Labs  11/15/15 1208  TIBC 286  IRON 84   Sepsis Labs: No results for input(s): PROCALCITON, LATICACIDVEN in the last 168 hours.  No results found for this or any previous visit (from the past 240 hour(s)).       Radiology Studies: Dg Chest 2 View  Result Date: 11/14/2015 CLINICAL DATA:  Shortness of breath and acute swelling over entire body past 3 weeks. Right heart failure. EXAM: CHEST  2 VIEW  COMPARISON:  09/09/2015 FINDINGS: Lungs are somewhat hypoinflated demonstrate prominence of the perihilar markings which is worsened likely due mild interstitial edema. Mild linear density over the right base likely atelectasis. Moderate stable cardiomegaly. Calcified plaque over the aortic arch. There are degenerative changes of the spine. IMPRESSION: Cardiomegaly with findings suggesting mild interstitial edema. Mild right basilar atelectasis. Aortic atherosclerosis. Electronically Signed   By: Marin Olp M.D.   On: 11/14/2015 14:16        Scheduled Meds: . aspirin EC  81 mg Oral q  morning - 10a  . atorvastatin  40 mg Oral QPM  . baclofen  5 mg Oral BID  . calcitRIOL  0.25 mcg Oral Daily  . darbepoetin (ARANESP) injection - NON-DIALYSIS  100 mcg Subcutaneous Q Fri-1800  . docusate sodium  100 mg Oral BID  . enoxaparin (LOVENOX) injection  30 mg Subcutaneous Q24H  . ferrous sulfate  325 mg Oral Daily  . ferumoxytol  510 mg Intravenous Once  . furosemide  160 mg Oral TID  . levothyroxine  112 mcg Oral QAC breakfast  . magnesium oxide  400 mg Oral Daily  . metoprolol succinate  12.5 mg Oral Daily  . morphine  30 mg Oral BID  . potassium chloride  40 mEq Oral Daily  . sertraline  100 mg Oral Daily   Continuous Infusions:    LOS: 2 days    Time spent: 35 min    Kelvin Cellar, MD [facility] Pager [phone]  If 7PM-7AM, please contact night-coverage [email or web address] Password [facility] 11/16/2015, 9:37 AM

## 2015-11-17 DIAGNOSIS — I10 Essential (primary) hypertension: Secondary | ICD-10-CM

## 2015-11-17 DIAGNOSIS — N183 Chronic kidney disease, stage 3 (moderate): Secondary | ICD-10-CM

## 2015-11-17 LAB — BASIC METABOLIC PANEL
Anion gap: 8 (ref 5–15)
BUN: 31 mg/dL — AB (ref 6–20)
CHLORIDE: 100 mmol/L — AB (ref 101–111)
CO2: 34 mmol/L — AB (ref 22–32)
Calcium: 10 mg/dL (ref 8.9–10.3)
Creatinine, Ser: 1.36 mg/dL — ABNORMAL HIGH (ref 0.44–1.00)
GFR calc Af Amer: 42 mL/min — ABNORMAL LOW (ref >60)
GFR calc non Af Amer: 36 mL/min — ABNORMAL LOW (ref >60)
GLUCOSE: 84 mg/dL (ref 65–99)
POTASSIUM: 3.9 mmol/L (ref 3.5–5.1)
Sodium: 142 mmol/L (ref 135–145)

## 2015-11-17 LAB — RENAL FUNCTION PANEL
Albumin: 3.5 g/dL (ref 3.5–5.0)
Anion gap: 8 (ref 5–15)
BUN: 30 mg/dL — AB (ref 6–20)
CHLORIDE: 100 mmol/L — AB (ref 101–111)
CO2: 34 mmol/L — AB (ref 22–32)
Calcium: 10 mg/dL (ref 8.9–10.3)
Creatinine, Ser: 1.41 mg/dL — ABNORMAL HIGH (ref 0.44–1.00)
GFR calc Af Amer: 40 mL/min — ABNORMAL LOW (ref >60)
GFR calc non Af Amer: 34 mL/min — ABNORMAL LOW (ref >60)
GLUCOSE: 86 mg/dL (ref 65–99)
POTASSIUM: 4 mmol/L (ref 3.5–5.1)
Phosphorus: 2.4 mg/dL — ABNORMAL LOW (ref 2.5–4.6)
Sodium: 142 mmol/L (ref 135–145)

## 2015-11-17 LAB — CBC
HEMATOCRIT: 39.7 % (ref 36.0–46.0)
Hemoglobin: 11.6 g/dL — ABNORMAL LOW (ref 12.0–15.0)
MCH: 31.8 pg (ref 26.0–34.0)
MCHC: 29.2 g/dL — ABNORMAL LOW (ref 30.0–36.0)
MCV: 108.8 fL — AB (ref 78.0–100.0)
Platelets: 108 10*3/uL — ABNORMAL LOW (ref 150–400)
RBC: 3.65 MIL/uL — ABNORMAL LOW (ref 3.87–5.11)
RDW: 15.7 % — AB (ref 11.5–15.5)
WBC: 4.9 10*3/uL (ref 4.0–10.5)

## 2015-11-17 MED ORDER — FUROSEMIDE 80 MG PO TABS: 160.0000 mg | ORAL_TABLET | Freq: Three times a day (TID) | ORAL | 1 refills | Status: DC

## 2015-11-17 MED ORDER — CALCITRIOL 0.25 MCG PO CAPS: 0.2500 ug | ORAL_CAPSULE | Freq: Every day | ORAL | 1 refills | Status: DC

## 2015-11-17 MED ORDER — DARBEPOETIN ALFA 100 MCG/0.5ML IJ SOSY: 100.0000 ug | PREFILLED_SYRINGE | INTRAMUSCULAR | 0 refills | Status: DC

## 2015-11-17 NOTE — Progress Notes (Signed)
Subjective: Interval History: has no complaint , doing better.  Objective: Vital signs in last 24 hours: Temp:  [97.6 F (36.4 C)-97.9 F (36.6 C)] 97.9 F (36.6 C) (10/01 0624) Pulse Rate:  [65-139] 69 (10/01 0624) Resp:  [16-18] 18 (10/01 0624) BP: (99-126)/(59-69) 126/68 (10/01 0624) SpO2:  [95 %-100 %] 100 % (10/01 0624) Weight change:   Intake/Output from previous day: 09/30 0701 - 10/01 0700 In: 1040 [P.O.:1040] Out: 1750 [Urine:1750] Intake/Output this shift: Total I/O In: -  Out: 1100 [Urine:1100]  General appearance: alert, cooperative and head tilt Resp: diminished breath sounds bilaterally and rales bibasilar Cardio: S1, S2 normal and systolic murmur: holosystolic 2/6, blowing at apex GI: pos bs, abdm wall edema Extremities: edema pitting and brawny  Lab Results:  Recent Labs  11/15/15 0511 11/17/15 0605  WBC 4.5 4.9  HGB 11.0* 11.6*  HCT 37.0 39.7  PLT 116* 108*   BMET:  Recent Labs  11/16/15 0619 11/17/15 0605  NA 140 142  142  K 4.6 3.9  4.0  CL 101 100*  100*  CO2 34* 34*  34*  GLUCOSE 97 84  86  BUN 32* 31*  30*  CREATININE 1.49* 1.36*  1.41*  CALCIUM 9.6 10.0  10.0    Recent Labs  11/15/15 1208  PTH 185*   Iron Studies:  Recent Labs  11/15/15 1208  IRON 84  TIBC 286    Studies/Results: No results found.  I have reviewed the patient's current medications.  Assessment/Plan: 1  CKD 4 diuresing on po Lasix for 48 h.  Would cont same dose outpatient 2 Milroys 3 Afib 4 falls 5 Debill 6 HPTH vit D 7 Anemia given fe, on esa P ok to D/c , F/U with Dr. Lorrene Reid in 2-3 wk    LOS: 3 days   Victoria Fisher L 11/17/2015,9:38 AM

## 2015-11-17 NOTE — Discharge Summary (Signed)
Physician Discharge Summary  Victoria Fisher [id number] DOB: Mar 09, 1934 DOA: 11/14/2015  PCP: Haywood Pao, MD  Admit date: 11/14/2015 Discharge date: 11/17/2015  Time spent: 35 minutes  Recommendations for Outpatient Follow-up:  1. Please follow up renal function, she was discharged on a higher dose on Lasix at 120 mg PO TID, as recommended by Nephrology.  2. Follow up on volume status 3. Home Health were resumed prior to discharge   Discharge Diagnoses:  Principal Problem:   Lymphedema Active Problems:   Essential hypertension   MILROY'S DISEASE   CKD (chronic kidney disease), stage III   Chronic diastolic CHF (congestive heart failure) (HCC)   Chronic pain syndrome   Chronic cor pulmonale (HCC)   Atrial fibrillation, chronic (HCC)   Acute on chronic respiratory failure ([place])   Hypokalemia   Discharge Condition: Stable  Diet recommendation: Heart healthy low sodium  Filed Weights   11/14/15 2003  Weight: 81.9 kg (180 lb 8.9 oz)    History of present illness:  Victoria Fisher is a 81 y.o. 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  "Just so very swollen.  Swollen everywhere, miserably swollen."  Hard to walk, hard to bend over, hard to get up and down.  Has Milroy syndrome, which causes lymphedema.  Generally just in her legs but this time "it has just gone crazy and is all over".  Buttocks, stomach, one breast much larger than the other.  Has been on diuretics for many years.  She was hospitalized in July and when she came out her PCP took her off of everything including diuretics.  Her doctors have recently resumed Lasix 40 daily and now BID.  No outward signs that this is helping.  Has voided only a little since she was given Lasix in the ER.  Mild SOB, especially at  night while in bed.  Chronic constipation.  No chest pain.  +generalized weakness.  No falls in at least 3-4 weeks - but also not able to get around as well and so not as independent.  Bad reaction to gabapentin following last hospitalization, had edema of head/face, eyes shut, psychosis; was decreased on chronic morphine and so gabapentin added as an adjuinct.   Hospital Course:  Victoria Fisher is a pleasant 81 year old female with a history of multiple comorbidities including lymphadenopathy, admitted to medicine service on 11/06/2015 presenting with anasarca. She had been on Lasix 40 mg by mouth twice a day. She was started on Lasix 120 mg IV 3 times a day, good urinary output. By the following morning she reported feeling much better.   1.  History of lymphedema -She presented with anasarca, Lasix was increased to 120 mg IV 3 times a day. -He has a history of Milroy's disease -On 11/16/2015 continues to have significant edema on lower extremities, not quite at her baseline though wants to go home today. After talking with her she is agreeable to staying one more day for IV diuresis, plan to discharge in am if she continues to show improvement. Renal function stable this am, Cr = 1.49 -By 11/17/2015 her she had been switched to Lasix 120 mg PO TID, with am labs showing stable Cr of 1.36. Case discussed with Dr Jimmy Footman of Nephrology who felt she was stable for discharge.   2.  Acute on chronic diastolic  congestive heart failure -Last transthoracic echocardiogram performed 2015 that showed a preserved ejection fraction 55-60% -Presenting with volume overload, continue IV diuresis with Lasix at 80 mg IV 3 times a day -Discharged on Lasix 129 mg PO TID, please follow up on volume status  3.  Stage III chronic disease -Baseline creatinine near 1.3-1.5 -Had stable C4r of 1.3 on day of discharge  4.  Hypokalemia -Labs showing potassium of 3.0, likely related to IV diuresis, will replace with  potassium chloride 40 mEq by mouth x 2 doses -On 11/16/2015 labs showing improved K of 4.6  5. History of atrial fibrillation -CHADVasc score of 5 -Presently not on anticoagulation -Remains rate controlled, continue   Consultations:  Nephrology  Discharge Exam: Vitals:   11/16/15 2221 11/17/15 0624  BP: 108/68 126/68  Pulse: (!) 139 69  Resp: 18 18  Temp: 97.6 F (36.4 C) 97.9 F (36.6 C)   General exam: Appears calm and comfortable, she is in no acute distress   Respiratory system: Clear to auscultation. Respiratory effort normal. Cardiovascular system: S1 & S2 heard, RRR. No JVD, murmurs, rubs, gallops or clicks. There is 2+ bilateral extremity pitting edema  Gastrointestinal system: Abdomen is nondistended, soft and nontender. No organomegaly or masses felt. Normal bowel sounds heard. Central nervous system: Alert and oriented. No focal neurological deficits. Extremities: Symmetric 5 x 5 power. Skin: No rashes, lesions or ulcers Psychiatry: Judgement and insight appear normal. Mood & affect appropriate.   Discharge Instructions   Discharge Instructions    Call MD for:    Complete by:  As directed    Call MD for:  difficulty breathing, headache or visual disturbances    Complete by:  As directed    Call MD for:  extreme fatigue    Complete by:  As directed    Call MD for:  hives    Complete by:  As directed    Call MD for:  persistant dizziness or light-headedness    Complete by:  As directed    Call MD for:  persistant nausea and vomiting    Complete by:  As directed    Call MD for:  redness, tenderness, or signs of infection (pain, swelling, redness, odor or green/yellow discharge around incision site)    Complete by:  As directed    Call MD for:  severe uncontrolled pain    Complete by:  As directed    Call MD for:  temperature >100.4    Complete by:  As directed    Diet - low sodium heart healthy    Complete by:  As directed    Increase activity slowly     Complete by:  As directed      Current Discharge Medication List    START taking these medications   Details  calcitRIOL (ROCALTROL) 0.25 MCG capsule Take 1 capsule (0.25 mcg total) by mouth daily. Qty: 30 capsule, Refills: 1    Darbepoetin Alfa (ARANESP) 100 MCG/0.5ML SOSY injection Inject 0.5 mLs (100 mcg total) into the skin every Friday at 6 PM. Qty: 4.2 mL, Refills: 0      CONTINUE these medications which have CHANGED   Details  furosemide (LASIX) 80 MG tablet Take 2 tablets (160 mg total) by mouth 3 (three) times daily. Qty: 90 tablet, Refills: 1      CONTINUE these medications which have NOT CHANGED   Details  aspirin EC 81 MG tablet Take 81 mg by mouth daily.     atorvastatin (  LIPITOR) 40 MG tablet Take 40 mg by mouth daily after supper.     Cholecalciferol (VITAMIN D) 2000 units CAPS Take 2,000 Units by mouth daily.    docusate sodium (COLACE) 100 MG capsule Take 100 mg by mouth 2 (two) times daily.    ferrous sulfate 325 (65 FE) MG tablet Take 325 mg by mouth daily after supper.     levalbuterol (XOPENEX HFA) 45 MCG/ACT inhaler Inhale 1 puff into the lungs every 4 (four) hours as needed for wheezing or shortness of breath.     levothyroxine (SYNTHROID, LEVOTHROID) 112 MCG tablet Take 1 tablet (112 mcg total) by mouth every morning. Qty: 30 tablet, Refills: 6    magnesium oxide (MAG-OX) 400 (241.3 MG) MG tablet Take 1 tablet (400 mg total) by mouth daily. Qty: 30 tablet, Refills: 5    metoprolol succinate (TOPROL-XL) 50 MG 24 hr tablet Take 50 mg by mouth daily. Take with or immediately following a meal.    mometasone (NASONEX) 50 MCG/ACT nasal spray Place 1 spray into the nose daily as needed (congestion).    morphine (MS CONTIN) 30 MG 12 hr tablet Take 1 tablet (30 mg total) by mouth 2 (two) times daily. Qty: 8 tablet, Refills: 0    nitroGLYCERIN (NITROLINGUAL) 0.4 MG/SPRAY spray Place 1 spray under the tongue every 5 (five) minutes as needed for chest  pain. Qty: 12 g, Refills: 1    polyethylene glycol (MIRALAX / GLYCOLAX) packet Take 17 g by mouth 2 (two) times daily as needed (constipation). Mix in 8 oz liquid and drink    Probiotic Product (PROBIOTIC PO) Take 1 tablet by mouth daily.    senna (SENOKOT) 8.6 MG TABS tablet Take 1 tablet by mouth 3 (three) times daily as needed for mild constipation.    sertraline (ZOLOFT) 100 MG tablet Take 100 mg by mouth daily.    vitamin E 400 UNIT capsule Take 400 Units by mouth daily.    morphine (MSIR) 15 MG tablet Take 1 tablet (15 mg total) by mouth 3 (three) times daily as needed (breakthrough pain). Qty: 8 tablet, Refills: 0      STOP taking these medications     baclofen (LIORESAL) 10 MG tablet      sennosides-docusate sodium (SENOKOT-S) 8.6-50 MG tablet        Allergies  Allergen Reactions  . Codeine Anaphylaxis, Hives and Nausea And Vomiting  . Other Other (See Comments)    Will not accept blood or blood products - patient is Jehovah's Witness  . Ciprofloxacin Hives and Nausea And Vomiting  . Gabapentin Swelling  . Latex Hives and Rash  . Penicillins Hives    Has patient had a PCN reaction causing immediate rash, facial/tongue/throat swelling, SOB or lightheadedness with hypotension: Yes Has patient had a PCN reaction causing severe rash involving mucus membranes or skin necrosis: Yes Has patient had a PCN reaction that required hospitalization Yes Has patient had a PCN reaction occurring within the last 10 years: Yes If all of the above answers are "NO", then may proceed with Cephalosporin use.  . Sulfonamide Derivatives Hives and Rash   Follow-up Information    Haywood Pao, MD Follow up in 1 week(s).   Specialty:  Internal Medicine Contact information: [place] [address] [phone]            The results of significant diagnostics from this hospitalization (including imaging, microbiology, ancillary and laboratory) are listed below  for reference.  Significant Diagnostic Studies: Dg Chest 2 View  Result Date: 11/14/2015 CLINICAL DATA:  Shortness of breath and acute swelling over entire body past 3 weeks. Right heart failure. EXAM: CHEST  2 VIEW COMPARISON:  09/09/2015 FINDINGS: Lungs are somewhat hypoinflated demonstrate prominence of the perihilar markings which is worsened likely due mild interstitial edema. Mild linear density over the right base likely atelectasis. Moderate stable cardiomegaly. Calcified plaque over the aortic arch. There are degenerative changes of the spine. IMPRESSION: Cardiomegaly with findings suggesting mild interstitial edema. Mild right basilar atelectasis. Aortic atherosclerosis. Electronically Signed   By: Marin Olp M.D.   On: 11/14/2015 14:16    Microbiology: No results found for this or any previous visit (from the past 240 hour(s)).   Labs: Basic Metabolic Panel:  Recent Labs Lab 11/14/15 1324 11/14/15 1439 11/15/15 0511 11/16/15 0619 11/17/15 0605  NA 139  --  139 140 142  142  K 3.2*  --  3.0* 4.6 3.9  4.0  CL 100*  --  99* 101 100*  100*  CO2 30  --  32 34* 34*  34*  GLUCOSE 89  --  87 97 84  86  BUN 32*  --  31* 32* 31*  30*  CREATININE 1.55* 1.40* 1.48* 1.49* 1.36*  1.41*  CALCIUM 9.6  --  9.5 9.6 10.0  10.0  PHOS  --   --   --  2.7 2.4*   Liver Function Tests:  Recent Labs Lab 11/14/15 1324 11/16/15 0619 11/17/15 0605  AST 19  --   --   ALT 10*  --   --   ALKPHOS 73  --   --   BILITOT 0.9  --   --   PROT 6.3*  --   --   ALBUMIN 3.5 3.3* 3.5   No results for input(s): LIPASE, AMYLASE in the last 168 hours. No results for input(s): AMMONIA in the last 168 hours. CBC:  Recent Labs Lab 11/14/15 1324 11/15/15 0511 11/17/15 0605  WBC 4.5 4.5 4.9  NEUTROABS 2.7  --   --   HGB 11.2* 11.0* 11.6*  HCT 37.0 37.0 39.7  MCV 104.2* 105.7* 108.8*  PLT 109* 116* 108*   Cardiac Enzymes: No results for input(s): CKTOTAL, CKMB, CKMBINDEX, TROPONINI  in the last 168 hours. BNP: BNP (last 3 results)  Recent Labs  09/09/15 2206 11/14/15 1325  BNP 275.1* 451.8*    ProBNP (last 3 results) No results for input(s): PROBNP in the last 8760 hours.  CBG: No results for input(s): GLUCAP in the last 168 hours.     Signed:  Kelvin Cellar MD.  [facility] 11/17/2015, 9:56 AM

## 2015-11-18 ENCOUNTER — Telehealth: Payer: Self-pay | Admitting: Cardiovascular Disease

## 2015-11-18 NOTE — Telephone Encounter (Signed)
Returned call - patient had me speak with her home caregiver. Noted concerns for recent exacerbation of CHF, fluid gain, shortness of breath esp at rest. This was followed in hospital. Notes also that patient was on O2 while hospitalized and under the impression that she would be discharged home with this. I don't see any discharge notes detailing need for home O2.  Aware that if the shortness of breath is related to her recent fluid load issues, we may need to look at other solutions. As of hospital discharge she is currently dosed for total of 480mg  lasix daily. Pt denies urgent complaints at this time. She sees Dr. Sallyanne Kuster on Thursday. Routed for recommendations.

## 2015-11-18 NOTE — Telephone Encounter (Signed)
Spoke again with home caregiver and patient. The caregiver is there 4 days a week but does not do VS checks - she is there to assist w ADLs and medications. She states an Nurse, mental health comes out 2 days a week and is scheduled to see patient tomorrow. Will get a more complete assessment at that time. Pt notes she only started on the lasix dose increase today. We discussed that this may take a couple of days for her to see improvements to her fluid load. She is OK to follow up Thursday as scheduled, and will have RN call if sooner concerns.

## 2015-11-18 NOTE — Telephone Encounter (Signed)
Has she weighed, can she tell how much fluid gain since hospital DC? Does she have home health? If so, can they check pulse-ox and weight ? Probably do not need labs between now and Thursday, but may order them on Thursday

## 2015-11-18 NOTE — Telephone Encounter (Signed)
Pt was just discharged from [facility] on yesterday. She is scheduled to see Dr see on 11-21-15,she wants to know if she will have to get lab work since she had lab work in the hospital? At the hospital she was on oxygen,she was told she would get an order to go home with oxygen. She came home without oxygen.

## 2015-11-21 ENCOUNTER — Ambulatory Visit (INDEPENDENT_AMBULATORY_CARE_PROVIDER_SITE_OTHER): Payer: Medicare Other | Admitting: Cardiovascular Disease

## 2015-11-21 VITALS — BP 96/58 | HR 72 | Ht 64.0 in

## 2015-11-21 DIAGNOSIS — N183 Chronic kidney disease, stage 3 unspecified: Secondary | ICD-10-CM

## 2015-11-21 DIAGNOSIS — Z79899 Other long term (current) drug therapy: Secondary | ICD-10-CM

## 2015-11-21 DIAGNOSIS — I5032 Chronic diastolic (congestive) heart failure: Secondary | ICD-10-CM

## 2015-11-21 DIAGNOSIS — I2781 Cor pulmonale (chronic): Secondary | ICD-10-CM

## 2015-11-21 DIAGNOSIS — Q820 Hereditary lymphedema: Secondary | ICD-10-CM | POA: Diagnosis not present

## 2015-11-21 DIAGNOSIS — I2510 Atherosclerotic heart disease of native coronary artery without angina pectoris: Secondary | ICD-10-CM

## 2015-11-21 DIAGNOSIS — I482 Chronic atrial fibrillation, unspecified: Secondary | ICD-10-CM

## 2015-11-21 MED ORDER — POTASSIUM CHLORIDE CRYS ER 20 MEQ PO TBCR: 20.0000 meq | EXTENDED_RELEASE_TABLET | Freq: Every day | ORAL | 11 refills | Status: DC

## 2015-11-21 MED ORDER — METOLAZONE 5 MG PO TABS
5.0000 mg | ORAL_TABLET | Freq: Every day | ORAL | 11 refills | Status: DC
Start: 2015-11-21 — End: 2015-12-19

## 2015-11-21 MED ORDER — TORSEMIDE 100 MG PO TABS: 100.0000 mg | ORAL_TABLET | Freq: Two times a day (BID) | ORAL | 11 refills | Status: DC

## 2015-11-21 NOTE — Progress Notes (Signed)
Cardiology Office Note    Date:  11/22/2015   ID:  RAEGHAN PAULINO, DOB 1934/04/21, MRN [id number]  PCP:  Haywood Pao, MD  Cardiologist:   Sanda Klein, MD   Chief Complaint  Patient presents with  . Follow-up    History of Present Illness:  Victoria Fisher is a 81 y.o. female with permanent atrial fibrillation, diastolic heart failure, mild coronary artery disease, COPD and chronic kidney disease stage III as well as leg lymphedema presents for follow-up.  She was hospitalized with severe hypovolemia and required intravenous diuretics late September. She has subsequently gained back all the fluid that she lost during hospitalization and is maybe a little worse. She feels mildly short of breath. She has evidence of anasarca with hard pitting edema all the way up to her lower abdominal wall as well as abdominal distention and early satiety. She denies chest pain and is unaware of palpitations. She has not had dizziness or syncope but her blood pressure is relatively low at 96/58. She is extremely sedentary, only able to get up from her wheelchair to walk a few steps to the bathroom. She is not on anticoagulation due to frequent falls. She is extremely deconditioned and frail.  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 This is likely due to long-standing untreated obstructive sleep apnea and secondary pulmonary hypertension. She has moderate to severe tricuspid regurgitation.  She is scheduled for an echocardiogram next Tuesday.  Past Medical History:  Diagnosis Date  . Abnormality of gait 01/30/2015  . Anemia in CKD (chronic kidney disease)   . Anxiety   . Arthritis   . Asthma    On nebulizer  . Atrial fibrillation, chronic ([place])    a. Rate controlled w/ BB. No OAC 2/2 falls and gait instability.  . Bowel obstruction    Caused by scar tissue  . Chronic cor pulmonale (HCC)    Most  likely due to COPD (cannot rule out contribution of diastolic LV dysfunction). ECHO 07/30/11 pulmonary artery pressure was estimated to be 50-60% mmHg & there was moderate tricuspid insufficiency.  . Chronic diastolic CHF (congestive heart failure) ([place])    a. Predominantly right heart failure due to cor pulmonale & in turn this is most likely due to untreated OSA;  b. 05/2013 Echo: EF 55-60%, no rwma, mild to mod MR, sev dil LA, mod to sev dil RV w/ reduced systolic fxn, massively dil RA, mod-sev TR, PASP 15mmHg.  [name] Chronic pain syndrome   . Colon cancer ([place])    Carcinoma of sigmoid colon 1984 & 1985.  [name] COPD (chronic obstructive pulmonary disease) ([place])   . Coronary artery disease    a. occluded proximal diagonal artery on angiography 2004  . Depression   . Diverticulosis of colon   . 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 Dyspnea on exertion    Reluctant to take diuretics  . Edema of both legs    Venous Doppler 05/11/12 was normal & no evidence of thrombus or thrombophlebitis. Chronic LE edema related to both cor pulmonale & lymphedema, hyperlipidemia & HTN involving coronary atherosclerosis by cardiac cath in 2004. Cath showed a tiny old occlusion of the optional diagonal branch & also proximal LAD.  [name] Frequent falls    Possibly due to orthostatic hypotension but we have not seen that on our visits. Recent fall 05/2012 with scalp hematoma, swollen left LE, &  black eyes.  . Hyperlipidemia    On a statin. Catheterization 02/12/03 Showed a tiny old occlusion of the optional diagonal branch & also proximal LAD.  [name] Hypertension   . Hypothyroidism 2002   On hormone replacement therapy  . IBS (irritable bowel syndrome)   . Insomnia   . Kidney failure 09/05/2015   Pt states that she went to the doctor on thursday and was told she is in stage 4 kidney failure  . Lung nodule   . Milroy's disease    Lymphatic disorder [facility] at [facility]  . Obstructive sleep apnea    Polysomnogram 09/25/11 AHI: 53.6/hr  overall & 57.1/hr during REM sleep. Poor compliance previously with CPAP  . Pulmonary hypertension    Pullmonary hypertension, chronic cor pulmonale w/pulmonary artery pressures of 50&#x2011; 60 mmHg.  . Tuberculosis     Past Surgical History:  Procedure Laterality Date  . ABDOMINAL HYSTERECTOMY  1983  . APPENDECTOMY  1956  . CARDIAC CATHETERIZATION  02/12/03   Showed a tiny old occlusion of the optional diagonal branch & also proximal LAD.  [name] CHOLECYSTECTOMY  1984  . COLON BIOPSY    . COLON RESECTION     2 times  . COLONOSCOPY  07/23/10    Current Medications: Outpatient Medications Prior to Visit  Medication Sig Dispense Refill  . aspirin EC 81 MG tablet Take 81 mg by mouth daily.     [name] atorvastatin (LIPITOR) 40 MG tablet Take 40 mg by mouth daily after supper.     . calcitRIOL (ROCALTROL) 0.25 MCG capsule Take 1 capsule (0.25 mcg total) by mouth daily. 30 capsule 1  . Cholecalciferol (VITAMIN D) 2000 units CAPS Take 2,000 Units by mouth daily.    . Darbepoetin Alfa (ARANESP) 100 MCG/0.5ML SOSY injection Inject 0.5 mLs (100 mcg total) into the skin every Friday at 6 PM. 4.2 mL 0  . docusate sodium (COLACE) 100 MG capsule Take 100 mg by mouth 2 (two) times daily.    . ferrous sulfate 325 (65 FE) MG tablet Take 325 mg by mouth daily after supper.     . levalbuterol (XOPENEX HFA) 45 MCG/ACT inhaler Inhale 1 puff into the lungs every 4 (four) hours as needed for wheezing or shortness of breath.     . levothyroxine (SYNTHROID, LEVOTHROID) 112 MCG tablet Take 1 tablet (112 mcg total) by mouth every morning. (Patient taking differently: Take 112 mcg by mouth daily. ) 30 tablet 6  . magnesium oxide (MAG-OX) 400 (241.3 MG) MG tablet Take 1 tablet (400 mg total) by mouth daily. 30 tablet 5  . metoprolol succinate (TOPROL-XL) 50 MG 24 hr tablet Take 50 mg by mouth daily. Take with or immediately following a meal.    . mometasone (NASONEX) 50 MCG/ACT nasal spray Place 1 spray into the nose daily  as needed (congestion).    . morphine (MS CONTIN) 30 MG 12 hr tablet Take 1 tablet (30 mg total) by mouth 2 (two) times daily. (Patient taking differently: Take 30 mg by mouth See admin instructions. Take 1 tablet (30 mg) by mouth twice daily, may also take 1/2 tablet (15 mg) 3 times daily as needed for breakthrough pain) 8 tablet 0  . morphine (MSIR) 15 MG tablet Take 1 tablet (15 mg total) by mouth 3 (three) times daily as needed (breakthrough pain). 8 tablet 0  . nitroGLYCERIN (NITROLINGUAL) 0.4 MG/SPRAY spray Place 1 spray under the tongue every 5 (five) minutes as needed for chest pain. 12 g 1  .  polyethylene glycol (MIRALAX / GLYCOLAX) packet Take 17 g by mouth 2 (two) times daily as needed (constipation). Mix in 8 oz liquid and drink    . Probiotic Product (PROBIOTIC PO) Take 1 tablet by mouth daily.    [name] senna (SENOKOT) 8.6 MG TABS tablet Take 1 tablet by mouth 3 (three) times daily as needed for mild constipation.    . sertraline (ZOLOFT) 100 MG tablet Take 100 mg by mouth daily.    . vitamin E 400 UNIT capsule Take 400 Units by mouth daily.    . furosemide (LASIX) 80 MG tablet Take 2 tablets (160 mg total) by mouth 3 (three) times daily. 90 tablet 1   No facility-administered medications prior to visit.      Allergies:   Codeine; Other; Ciprofloxacin; Gabapentin; Latex; Penicillins; and Sulfonamide derivatives   Social History   Social History  . Marital status: Married    Spouse name: Georgiann Mccoy  . Number of children: 3  . Years of education: HS   Occupational History  .  Retired   Social History Main Topics  . Smoking status: Never Smoker  . Smokeless tobacco: Never Used  . Alcohol use No  . Drug use: No  . Sexual activity: No   Other Topics Concern  . Not on file   Social History Narrative   Patient is married Georgiann Mccoy) and lives at home with her husband.   Patient is retired.   Patient has three children and her husband has two children.   Patient is right-handed.     Patient drinks very little caffeine (1/2 cup daily)   Patient has a high school education.     Family History:  The patient's family history includes Alzheimer's disease in her mother; Diabetes in her brother; Heart disease in her father; Parkinsonism in her brother.   ROS:   Please see the history of present illness.    ROS All other systems reviewed and are negative.   PHYSICAL EXAM:   VS:  BP (!) 96/58   Pulse 72   Ht 5\' 4"  (1.626 m)    GEN: Well nourished, well developed, in no acute distress . She is alert and oriented and very pleasant but clearly feels poorly and appears weak and frail.  HEENT: normal  Neck: Markedly elevated JVP, on the average 10 cm above the sternal angle with very prominent V waves all the way to the earlobes, no carotid bruits, goiter or masses Cardiac: Irregular ; 3/6 holosystolic murmur at the left lower sternal border and no diastolic murmurs, rubs, or gallops. Hard pitting edema all the way to the hips buttocks and lower abdominal wall. Respiratory:  Admission bases but otherwise clear to auscultation bilaterally, normal work of breathing GI: soft, nontender, nondistended, + BS MS: no deformity or atrophy  Skin: warm and dry, no rash Neuro:  Alert and Oriented x 3, Strength and sensation are intact. Has "dropped head syndrome" with had consistently tilted to the left Psych: euthymic mood, full affect  Wt Readings from Last 3 Encounters:  11/14/15 180 lb 8.9 oz (81.9 kg)  10/29/15 160 lb (72.6 kg)  10/17/15 169 lb 9.6 oz (76.9 kg)      Studies/Labs Reviewed:   EKG:  EKG is not ordered today.  The ekg ordered 11/15/15  demonstrates atrial fibrillation with controlled rate, incomplete right bundle branch block and generalized low voltage  Recent Labs: 09/12/2015: Magnesium 2.4 11/14/2015: ALT 10; B Natriuretic Peptide 451.8 11/15/2015: TSH 7.936 11/17/2015: BUN  30; BUN 31; Creatinine, Ser 1.41; Creatinine, Ser 1.36; Hemoglobin 11.6; Platelets 108;  Potassium 4.0; Potassium 3.9; Sodium 142; Sodium 142   Lipid Panel    Component Value Date/Time   CHOL 59 09/10/2015 0403   TRIG 22 09/10/2015 0403   HDL 32 (L) 09/10/2015 0403   CHOLHDL 1.8 09/10/2015 0403   VLDL 4 09/10/2015 0403   LDLCALC 23 09/10/2015 0403    Additional studies/ records that were reviewed today include:  Notes from Ignacia Bayley, Kerin Ransom and recent hospitalization    ASSESSMENT:    1. Chronic diastolic CHF (congestive heart failure) ([place])   2. Medication management      PLAN:  In order of problems listed above:  1. CHF:  Retal has evidence of anasarca and has rapidly gained back all the weight she lost during her hospitalization, despite being on an enormous dose of furosemide (480 mg daily in (. Oral diuretics may lower longer be effective since she is so edematous and likely has poor absorption and hepatic dysfunction. Try to switch to torsemide and add metolazone but it is quite likely she will require repeat hospitalization for intravenous diuretics. We'll bring her back for lab work on Monday and she will have her echocardiogram on Tuesday. If we do not see substantial improvement in that interval would recommend hospitalization. Would also recommend referral to the heart failure clinic as this may allow compensation using periodic outpatient intravenous diuretics and may help [place] avoid repeat hospitalization. She has severe right ventricular dysfunction with secondary tricuspid regurgitation, further worsening right heart failure. Hopefully with aggressive diuresis will be able to turn her around. 2. Cor pulmonale/pulmonary HTN: At least in part due to years of untreated obstructive sleep apnea, now compounded by severe tricuspid insufficiency. 3. Lymphedema: I think this is currently a secondary component of her severe edema, most of it is hydrostatic edema due to heart failure. 4. CKD: Heart failure seems to be by far her most immediate threats and I  would pay less attention to renal parameters while promoting diuresis. We do need to focus on electrolytes to avoid arrhythmia. 5. AFib: Unable to anticoagulate due to fall risk and bleeding. Rate control is adequate 6. CAD: She has a known occlusion of a proximal diagonal artery with a corresponding ischemic defect on her most recent nuclear stress test. Angina is not a current complaint.. 7. HLP: LDL is now extremely low as is the total cholesterol, possibly a sign of hepatic dysfunction due to chronic right heart failure. Will stop her statin and reevaluate.    Medication Adjustments/Labs and Tests Ordered: Current medicines are reviewed at length with the patient today.  Concerns regarding medicines are outlined above.  Medication changes, Labs and Tests ordered today are listed in the Patient Instructions below. Patient Instructions  Medication Instructions: Dr Sallyanne Kuster has recommended making the following medication changes: 1. STOP Furosemdie 2. START Torsemide 100 mg - take 1 tablet by mouth TWICE daily 3. START Metolazone 5 mg - take 1 tablet by mouth once daily 30 minutes before torsemide 4. START Potassium 20 mEq - take 1 tablet by mouth once daily  Labwork: Your physician recommends that you return for lab work on [facility].  Testing/Procedures: NONE ORDERED  Follow-up: You have been referred to our Heart Failure clinic. You should be contacted by their office to schedule an appointment.  Dr Sallyanne Kuster recommends that you schedule a follow-up appointment in 4-6 weeks.  If you need a refill on your cardiac medications  before your next appointment, please call your pharmacy.    Signed, Sanda Klein, MD  11/22/2015 3:19 PM    [address], [place], [place]  [address] Phone: [phone]; Fax: [phone]

## 2015-11-21 NOTE — Patient Instructions (Signed)
Medication Instructions: Dr Sallyanne Kuster has recommended making the following medication changes: 1. STOP Furosemdie 2. START Torsemide 100 mg - take 1 tablet by mouth TWICE daily 3. START Metolazone 5 mg - take 1 tablet by mouth once daily 30 minutes before torsemide 4. START Potassium 20 mEq - take 1 tablet by mouth once daily  Labwork: Your physician recommends that you return for lab work on [facility].  Testing/Procedures: NONE ORDERED  Follow-up: You have been referred to our Heart Failure clinic. You should be contacted by their office to schedule an appointment.  Dr Sallyanne Kuster recommends that you schedule a follow-up appointment in 4-6 weeks.  If you need a refill on your cardiac medications before your next appointment, please call your pharmacy.

## 2015-11-22 ENCOUNTER — Telehealth: Payer: Self-pay | Admitting: Cardiovascular Disease

## 2015-11-22 ENCOUNTER — Telehealth (HOSPITAL_COMMUNITY): Payer: Self-pay | Admitting: Vascular Surgery

## 2015-11-22 NOTE — Telephone Encounter (Signed)
New Message  Lorriane Shire voiced wanting to enroll pt in the Heart failure clinic program and it typically last for about 2-3 weeks.  Lorriane Shire voiced pt wants to learn about Heart failure practices due to her husband being a heart failure clinic patient.  Lorriane Shire voiced pt has a lot of question with which she can not answer but have questions about her medications but doesn't know of what medications.  Lorriane Shire voiced wanting orders to place pt on program.  Please f/u

## 2015-11-22 NOTE — Telephone Encounter (Signed)
Left pt message to make NP appt  

## 2015-11-22 NOTE — Telephone Encounter (Signed)
Attempted to return Vanessa's call - goes to full VM box. Patient has already been referred to CHF clinic by Dr. Sallyanne Kuster when seen in office this week.

## 2015-11-25 ENCOUNTER — Telehealth (HOSPITAL_COMMUNITY): Payer: Self-pay | Admitting: Vascular Surgery

## 2015-11-25 NOTE — Telephone Encounter (Signed)
Left pt message to make NP appt  

## 2015-11-25 NOTE — Telephone Encounter (Signed)
Victoria Fisher aware of recommendations, pending orders. She voiced understanding. Encouraged her to continue med regimen for patient w changes and to notify if swelling worse in interim.

## 2015-11-26 ENCOUNTER — Other Ambulatory Visit (HOSPITAL_COMMUNITY): Payer: Self-pay

## 2015-12-02 ENCOUNTER — Other Ambulatory Visit: Payer: Self-pay | Admitting: *Deleted

## 2015-12-02 MED ORDER — NITROGLYCERIN 0.4 MG SL SUBL: 0.4000 mg | SUBLINGUAL_TABLET | SUBLINGUAL | 1 refills | Status: DC | PRN

## 2015-12-04 ENCOUNTER — Telehealth: Payer: Self-pay

## 2015-12-04 NOTE — Telephone Encounter (Signed)
-----   Message from Sanda Klein, MD sent at 11/22/2015  3:21 PM EDT ----- Please tell her I was concerned by how low her cholesterol was, probably a sign of liver congestion and liver dysfunction. Tell her to stop the atorvastatin for now and we will reevaluate in a few months.

## 2015-12-04 NOTE — Telephone Encounter (Signed)
Attempted to contact patient x2 (on separate occasions). Patient does not have voicemail set up.  Left message for patient's son, Thomasenia Bottoms [facility] and [place]) to call back to discuss.

## 2015-12-05 ENCOUNTER — Ambulatory Visit (HOSPITAL_COMMUNITY)
Admission: RE | Admit: 2015-12-05 | Discharge: 2015-12-05 | Disposition: A | Discharge status: Home / Self Care | Payer: Medicare Other | Source: Ambulatory Visit | Attending: Nephrology | Admitting: Nephrology

## 2015-12-05 DIAGNOSIS — N184 Chronic kidney disease, stage 4 (severe): Secondary | ICD-10-CM | POA: Diagnosis not present

## 2015-12-05 DIAGNOSIS — D638 Anemia in other chronic diseases classified elsewhere: Secondary | ICD-10-CM | POA: Diagnosis not present

## 2015-12-05 DIAGNOSIS — N183 Chronic kidney disease, stage 3 unspecified: Secondary | ICD-10-CM

## 2015-12-05 LAB — IRON AND TIBC
Iron: 52 ug/dL (ref 28–170)
SATURATION RATIOS: 18 % (ref 10.4–31.8)
TIBC: 291 ug/dL (ref 250–450)
UIBC: 239 ug/dL

## 2015-12-05 LAB — POCT HEMOGLOBIN-HEMACUE: Hemoglobin: 11.1 g/dL — ABNORMAL LOW (ref 12.0–15.0)

## 2015-12-05 LAB — FERRITIN: FERRITIN: 281 ng/mL (ref 11–307)

## 2015-12-05 MED ORDER — DARBEPOETIN ALFA 100 MCG/0.5ML IJ SOSY
PREFILLED_SYRINGE | INTRAMUSCULAR | Status: AC
  Filled 2015-12-05: qty 0.5

## 2015-12-05 MED ORDER — DARBEPOETIN ALFA 100 MCG/0.5ML IJ SOSY
100.0000 ug | PREFILLED_SYRINGE | INTRAMUSCULAR | Status: DC
  Administered 2015-12-05: 100 ug via SUBCUTANEOUS

## 2015-12-09 ENCOUNTER — Ambulatory Visit (HOSPITAL_COMMUNITY): Payer: Medicare Other | Attending: Nurse Practitioner

## 2015-12-09 ENCOUNTER — Other Ambulatory Visit: Payer: Self-pay

## 2015-12-09 DIAGNOSIS — I2781 Cor pulmonale (chronic): Secondary | ICD-10-CM | POA: Diagnosis not present

## 2015-12-09 DIAGNOSIS — I509 Heart failure, unspecified: Secondary | ICD-10-CM | POA: Diagnosis not present

## 2015-12-09 DIAGNOSIS — I110 Hypertensive heart disease with heart failure: Secondary | ICD-10-CM | POA: Diagnosis not present

## 2015-12-09 DIAGNOSIS — I482 Chronic atrial fibrillation, unspecified: Secondary | ICD-10-CM

## 2015-12-09 DIAGNOSIS — I340 Nonrheumatic mitral (valve) insufficiency: Secondary | ICD-10-CM | POA: Diagnosis not present

## 2015-12-09 DIAGNOSIS — I071 Rheumatic tricuspid insufficiency: Secondary | ICD-10-CM | POA: Insufficient documentation

## 2015-12-10 ENCOUNTER — Ambulatory Visit (HOSPITAL_COMMUNITY)
Admission: RE | Admit: 2015-12-10 | Discharge: 2015-12-10 | Disposition: A | Discharge status: Home / Self Care | Payer: Medicare Other | Source: Ambulatory Visit | Attending: Cardiology | Admitting: Cardiology

## 2015-12-10 ENCOUNTER — Telehealth (HOSPITAL_COMMUNITY): Payer: Self-pay | Admitting: Cardiology

## 2015-12-10 ENCOUNTER — Encounter (HOSPITAL_COMMUNITY): Payer: Self-pay

## 2015-12-10 VITALS — BP 106/54 | HR 67 | Wt 158.2 lb

## 2015-12-10 DIAGNOSIS — Z820 Family history of epilepsy and other diseases of the nervous system: Secondary | ICD-10-CM | POA: Insufficient documentation

## 2015-12-10 DIAGNOSIS — N183 Chronic kidney disease, stage 3 unspecified: Secondary | ICD-10-CM

## 2015-12-10 DIAGNOSIS — I10 Essential (primary) hypertension: Secondary | ICD-10-CM

## 2015-12-10 DIAGNOSIS — I2510 Atherosclerotic heart disease of native coronary artery without angina pectoris: Secondary | ICD-10-CM | POA: Diagnosis not present

## 2015-12-10 DIAGNOSIS — Z880 Allergy status to penicillin: Secondary | ICD-10-CM | POA: Diagnosis not present

## 2015-12-10 DIAGNOSIS — I482 Chronic atrial fibrillation, unspecified: Secondary | ICD-10-CM

## 2015-12-10 DIAGNOSIS — I890 Lymphedema, not elsewhere classified: Secondary | ICD-10-CM | POA: Insufficient documentation

## 2015-12-10 DIAGNOSIS — I130 Hypertensive heart and chronic kidney disease with heart failure and stage 1 through stage 4 chronic kidney disease, or unspecified chronic kidney disease: Secondary | ICD-10-CM | POA: Diagnosis not present

## 2015-12-10 DIAGNOSIS — Z881 Allergy status to other antibiotic agents status: Secondary | ICD-10-CM | POA: Insufficient documentation

## 2015-12-10 DIAGNOSIS — G4733 Obstructive sleep apnea (adult) (pediatric): Secondary | ICD-10-CM | POA: Diagnosis not present

## 2015-12-10 DIAGNOSIS — Z882 Allergy status to sulfonamides status: Secondary | ICD-10-CM | POA: Insufficient documentation

## 2015-12-10 DIAGNOSIS — Z8249 Family history of ischemic heart disease and other diseases of the circulatory system: Secondary | ICD-10-CM | POA: Insufficient documentation

## 2015-12-10 DIAGNOSIS — Z91040 Latex allergy status: Secondary | ICD-10-CM | POA: Insufficient documentation

## 2015-12-10 DIAGNOSIS — Z833 Family history of diabetes mellitus: Secondary | ICD-10-CM | POA: Diagnosis not present

## 2015-12-10 DIAGNOSIS — Z9119 Patient's noncompliance with other medical treatment and regimen: Secondary | ICD-10-CM | POA: Insufficient documentation

## 2015-12-10 DIAGNOSIS — Z9109 Other allergy status, other than to drugs and biological substances: Secondary | ICD-10-CM | POA: Insufficient documentation

## 2015-12-10 DIAGNOSIS — Z885 Allergy status to narcotic agent status: Secondary | ICD-10-CM | POA: Diagnosis not present

## 2015-12-10 DIAGNOSIS — Z85038 Personal history of other malignant neoplasm of large intestine: Secondary | ICD-10-CM | POA: Insufficient documentation

## 2015-12-10 DIAGNOSIS — I5032 Chronic diastolic (congestive) heart failure: Secondary | ICD-10-CM | POA: Diagnosis not present

## 2015-12-10 DIAGNOSIS — R296 Repeated falls: Secondary | ICD-10-CM

## 2015-12-10 DIAGNOSIS — G243 Spasmodic torticollis: Secondary | ICD-10-CM

## 2015-12-10 DIAGNOSIS — Z7982 Long term (current) use of aspirin: Secondary | ICD-10-CM | POA: Diagnosis not present

## 2015-12-10 DIAGNOSIS — Q820 Hereditary lymphedema: Secondary | ICD-10-CM

## 2015-12-10 LAB — COMPREHENSIVE METABOLIC PANEL
ALBUMIN: 3.6 g/dL (ref 3.5–5.0)
ALT: 10 U/L — ABNORMAL LOW (ref 14–54)
AST: 24 U/L (ref 15–41)
Alkaline Phosphatase: 67 U/L (ref 38–126)
Anion gap: 12 (ref 5–15)
BUN: 85 mg/dL — AB (ref 6–20)
CHLORIDE: 91 mmol/L — AB (ref 101–111)
CO2: 35 mmol/L — AB (ref 22–32)
Calcium: 9.9 mg/dL (ref 8.9–10.3)
Creatinine, Ser: 1.78 mg/dL — ABNORMAL HIGH (ref 0.44–1.00)
GFR calc Af Amer: 30 mL/min — ABNORMAL LOW (ref >60)
GFR calc non Af Amer: 26 mL/min — ABNORMAL LOW (ref >60)
GLUCOSE: 102 mg/dL — AB (ref 65–99)
POTASSIUM: 2.1 mmol/L — AB (ref 3.5–5.1)
SODIUM: 138 mmol/L (ref 135–145)
Total Bilirubin: 1.7 mg/dL — ABNORMAL HIGH (ref 0.3–1.2)
Total Protein: 6.7 g/dL (ref 6.5–8.1)

## 2015-12-10 LAB — BRAIN NATRIURETIC PEPTIDE: B NATRIURETIC PEPTIDE 5: 399.4 pg/mL — AB (ref 0.0–100.0)

## 2015-12-10 NOTE — Patient Instructions (Signed)
Routine lab work today. Will notify you of abnormal results  Follow up in 7-10days.

## 2015-12-10 NOTE — Progress Notes (Signed)
Advanced Heart Failure Clinic Note   Referring Physician: Dr. Sallyanne Kuster Primary Care: Dr Osborne Casco Primary Cardiologist: Dr. Sallyanne Kuster Nephrology: Dr. Lorrene Reid  HPI:  Victoria Fisher is a 81 y.o. female with permanent Afib not on [facility] with frequent falls, Mild CAD, COPD, CKD stage III, chronic lymphedema, OSA, Milroys disease (lymphatic disorder), and Chronic diastolic CHF with RV failure.   Echo 12/09/15 LVEF 65-70%, RV severely dilated with "normal systolic function". Severe Tricuspid Regurg, PA peak pressure 34 mm Hg.   Last seen in Dr. Sallyanne Kuster office 11/21/15. Switched from lasix to high dose torsemide + metolazone.   She presents today to establish with HF clinic.  Has been feeling better since switch to torsemide. Weight is down 20 lbs.  She has very frequent falls. Fell 4 times last night, and 3-4 again so far today. Sometimes has to call paramedics to get her back up. The most she can do is take a few steps to the bathroom, and this is what leads her to fall. Gets SOB with getting ready for bed. + Orthopnea. She cannot hold her head up at all. She is supposed to use her walker at all times, but often does not, especially when getting up to pee at night. Never Smoker. No heavy ETOH use. No overt CP. Not very active. Does get lightheadedness and dizziness with rapid standing. Has never been tolerant of CPAP.   Social: Lives with husband in [place].  Has HHRN for Tue and Thurs 10-3pm. Has someone from church who helps her most other days.   Labs (11/17/15): creatinine 1.36, BUN 31 Labs (12/10/15): K 2.1, creatinine 1.78, BUN 85  PMH: 1. Chronic diastolic HF with prominent RV failure: Echo (10/17) with EF 65-70%, D-shaped interventricular septum suggestive of RV pressure/volume overload, RV severely dilated with normal systolic function, severe TR.   2. Chronic Afib: No anticoag with frequent falls. 3. CAD: She has a known occlusion of a small diagonal artery  with a corresponding ischemic defect on her most recent nuclear stress test. Angina is not a current complaint. 4. CKD stage III: Followed by Dr. Lorrene Reid.  5. 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  6. OSA: cannot tolerate CPAP.  7. History of colon cancer 8. HTN   Review of Systems: [y] = yes, [ ]  = no   General: Weight gain [ ] ; Weight loss [y]; Anorexia [ ] ; Fatigue [y]; Fever [ ] ; Chills [ ] ; Weakness [ ]   Cardiac: Chest pain/pressure [ ] ; Resting SOB [ ] ; Exertional SOB [y]; Orthopnea [y]; Pedal Edema [y]; Palpitations [ ] ; Syncope [ ] ; Presyncope [ ] ; Paroxysmal nocturnal dyspnea[ ]   Pulmonary: Cough [ ] ; Wheezing[ ] ; Hemoptysis[ ] ; Sputum [ ] ; Snoring [ ]   GI: Vomiting[ ] ; Dysphagia[ ] ; Melena[ ] ; Hematochezia [ ] ; Heartburn[ ] ; Abdominal pain [ ] ; Constipation [ ] ; Diarrhea [ ] ; BRBPR [ ]   GU: Hematuria[ ] ; Dysuria [ ] ; Nocturia[ ]   Vascular: Pain in legs with walking [y]; Pain in feet with lying flat [ ] ; Non-healing sores [y]; Stroke [ ] ; TIA [ ] ; Slurred speech [ ] ;  Neuro: Headaches[ ] ; Vertigo[ ] ; Seizures[ ] ; Paresthesias[ ] ;Blurred vision [ ] ; Diplopia [ ] ; Vision changes [ ]   Ortho/Skin: Arthritis [y]; Joint pain [y]; Muscle pain [y]; Joint swelling [ ] ; Back Pain [y]; Rash [ ]   Psych: Depression[ ] ; Anxiety[ ]   Heme: Bleeding problems [ ] ; Clotting disorders [ ] ; Anemia [ ]   Endocrine: Diabetes [ ] ; Thyroid dysfunction[y]   Current Outpatient Prescriptions  Medication Sig Dispense Refill  . aspirin EC 81 MG tablet Take 81 mg by mouth daily.     [name] atorvastatin (LIPITOR) 40 MG tablet Take 40 mg by mouth daily after supper.     . calcitRIOL (ROCALTROL) 0.25 MCG capsule Take 1 capsule (0.25 mcg total) by mouth daily. 30 capsule 1  . Cholecalciferol (VITAMIN D) 2000 units CAPS Take 2,000 Units by mouth daily.    . Darbepoetin Alfa (ARANESP) 100 MCG/0.5ML SOSY injection Inject 0.5 mLs (100 mcg total) into the skin every  Friday at 6 PM. 4.2 mL 0  . docusate sodium (COLACE) 100 MG capsule Take 100 mg by mouth 2 (two) times daily.    . ferrous sulfate 325 (65 FE) MG tablet Take 325 mg by mouth daily after supper.     . levalbuterol (XOPENEX HFA) 45 MCG/ACT inhaler Inhale 1 puff into the lungs every 4 (four) hours as needed for wheezing or shortness of breath.     . levothyroxine (SYNTHROID, LEVOTHROID) 112 MCG tablet Take 1 tablet (112 mcg total) by mouth every morning. 30 tablet 6  . magnesium oxide (MAG-OX) 400 (241.3 MG) MG tablet Take 1 tablet (400 mg total) by mouth daily. 30 tablet 5  . metolazone (ZAROXOLYN) 5 MG tablet Take 1 tablet (5 mg total) by mouth daily. 30 tablet 11  . metoprolol succinate (TOPROL-XL) 50 MG 24 hr tablet Take 50 mg by mouth daily. Take with or immediately following a meal.    . mometasone (NASONEX) 50 MCG/ACT nasal spray Place 1 spray into the nose daily as needed (congestion).    . morphine (MS CONTIN) 30 MG 12 hr tablet Take 1 tablet (30 mg total) by mouth 2 (two) times daily. (Patient taking differently: Take 30 mg by mouth See admin instructions. Take 1 tablet (30 mg) by mouth twice daily, may also take 1/2 tablet (15 mg) 3 times daily as needed for breakthrough pain) 8 tablet 0  . morphine (MSIR) 15 MG tablet Take 1 tablet (15 mg total) by mouth 3 (three) times daily as needed (breakthrough pain). 8 tablet 0  . potassium chloride SA (KLOR-CON M20) 20 MEQ tablet Take 1 tablet (20 mEq total) by mouth daily. 30 tablet 11  . Probiotic Product (PROBIOTIC PO) Take 1 tablet by mouth daily.    [name] senna (SENOKOT) 8.6 MG TABS tablet Take 1 tablet by mouth 3 (three) times daily as needed for mild constipation.    . sertraline (ZOLOFT) 100 MG tablet Take 100 mg by mouth daily.    [name] torsemide (DEMADEX) 100 MG tablet Take 1 tablet (100 mg total) by mouth 2 (two) times daily. 60 tablet 11  . vitamin E 400 UNIT capsule Take 400 Units by mouth daily.    . nitroGLYCERIN (NITROSTAT) 0.4 MG SL tablet  Place 1 tablet (0.4 mg total) under the tongue every 5 (five) minutes as needed for chest pain. (Patient not taking: Reported on 12/10/2015) 100 tablet 1  . polyethylene glycol (MIRALAX / GLYCOLAX) packet Take 17 g by mouth 2 (two) times daily as needed (constipation). Mix in 8 oz liquid and drink     No current facility-administered medications for this encounter.     Allergies  Allergen Reactions  . Codeine Anaphylaxis, Hives and Nausea And Vomiting  . Other Other (See Comments)    Will not accept blood or blood products - patient is Jehovah's Witness  . Ciprofloxacin  Hives and Nausea And Vomiting  . Gabapentin Swelling  . Latex Hives and Rash  . Penicillins Hives    Has patient had a PCN reaction causing immediate rash, facial/tongue/throat swelling, SOB or lightheadedness with hypotension: Yes Has patient had a PCN reaction causing severe rash involving mucus membranes or skin necrosis: Yes Has patient had a PCN reaction that required hospitalization Yes Has patient had a PCN reaction occurring within the last 10 years: Yes If all of the above answers are "NO", then may proceed with Cephalosporin use.  . Sulfonamide Derivatives Hives and Rash      Social History   Social History  . Marital status: Married    Spouse name: Georgiann Mccoy  . Number of children: 3  . Years of education: HS   Occupational History  .  Retired   Social History Main Topics  . Smoking status: Never Smoker  . Smokeless tobacco: Never Used  . Alcohol use No  . Drug use: No  . Sexual activity: No   Other Topics Concern  . Not on file   Social History Narrative   Patient is married Georgiann Mccoy) and lives at home with her husband.   Patient is retired.   Patient has three children and her husband has two children.   Patient is right-handed.   Patient drinks very little caffeine (1/2 cup daily)   Patient has a high school education.      Family History  Problem Relation Age of Onset  . Heart  disease Father   . Alzheimer's disease Mother   . Parkinsonism Brother   . Diabetes Brother     Vitals:   12/10/15 1146  BP: (!) 106/54  Pulse: 67  SpO2: 98%  Weight: 158 lb 4 oz (71.8 kg)    PHYSICAL EXAM: General: Chronically ill and fatigued appearing.  Dropped head.  HEENT: Nearly complete lack of muscle control of neck.  Neck: supple. JVD 16 cm with prominent CV waves. Carotids 2+ bilat; no bruits. No thryomegaly or nodule noted.  Cor: PMI nondisplaced. Irregularly irregular. 3/6 TR Murmur Lungs: Mildly diminished basilar sounds.  Abdomen: edematous flanks, NT, ND, no HSM. No bruits or masses. +BS  Extremities: no cyanosis, clubbing, rash. Chronic 2-3 + woody edema into flanks.  Neuro: alert & oriented x 3, cranial nerves grossly intact. moves all 4 extremities w/o difficulty. Affect pleasant.  ASSESSMENT & PLAN:   1. Chronic diastolic HF with prominent RV failure: She has been on torsemide 100 bid + metolazone since 10/5.  Weight is down but she remains quite volume overloaded.  She feels like the current diuretic regimen is working better than Lasix.  Labs were done today, showing rise in BUN and creatinine.   - Need to replace K, K is very low at 2.1. - Continue torsemide 100 mg bid but will decrease metolazone to every other day.  Will be a very delicate balance between cardiorenal syndrome and volume overload.   - Will need to see her frequently in the office, followup in 1 week with BMET.  - Pt follows with [facility].  Will place UNNA boots for significant edema/lymphedena, and will also activate diuretic protocol.  2. Chronic Afib: No anticoag with frequent falls. - Toprol XL for rate control. 3. CAD: She has a known occlusion of a proximal diagonal artery by cath 2004 with a corresponding ischemic defect on her most recent nuclear stress test per Dr Sallyanne Kuster. No CP currently.   - Continue ASA 81 and statin.  4. CKD stage III: Creatinine higher with more aggressive diuresis.   Adjusting diuretics as above. 5. 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  - Will have [facility] apply UNNA boots 6. OSA: Non-compliant with CPAP. Unwilling to try.  7. Frequent Falls: Worrisome.  Suspect she would benefit from a higher level of care.   Labs today. Follow up end of next week.   Legrand Como "Jonni Sanger" Chalmers Cater, PA-C 12/10/2015   Patient seen with PA, agree with the above note.  Permanent atrial fibrillation, frequent falls, chronic diastolic CHF with prominent RV failure.  Diuretics were recently changed and weight has gone down, but she still has significant volume overload on exam. Creatinine and BUN are higher.  - Continue current torsemide, decrease metolazone to every other day.  Repeat BMET next week.  - Need leg wraps given marked swelling.  - Followup in 1 week.    Loralie Champagne 12/11/2015

## 2015-12-10 NOTE — Telephone Encounter (Signed)
Patient is not active with [facility], not active with [facility], and patient is a resident at [facility], unable to accept orders or give patient the phone. Was advised to contact the patient directly.  Multiple messages left for the patient throughout the afternoon, call attempt @ [phone]- unable to leave message as voicemail has not been set up.  Spoke with patients son Steele Sizer) [phone], orders/ medication changes given to son. Son states he does not have a clue what [place] agencies she maybe using. No additional numbers to contact patient only her cell phone, which is what we have on file as well. Reports he will continue to try to reach her through the evening

## 2015-12-10 NOTE — Telephone Encounter (Signed)
Critical labs received K 2.1 Per Oda Kilts PA Change metolazone to every other day Potassium 60 meq TODAY and then 40 BID thereafter bmet needed 12/13/15

## 2015-12-11 MED ORDER — POTASSIUM CHLORIDE CRYS ER 20 MEQ PO TBCR: 40.0000 meq | EXTENDED_RELEASE_TABLET | Freq: Two times a day (BID) | ORAL | 3 refills | Status: DC

## 2015-12-11 NOTE — Telephone Encounter (Signed)
Spoke with Myra from [facility] and went over medication changes.

## 2015-12-12 ENCOUNTER — Ambulatory Visit (HOSPITAL_COMMUNITY)
Admission: RE | Admit: 2015-12-12 | Discharge: 2015-12-12 | Disposition: A | Discharge status: Home / Self Care | Payer: Medicare Other | Source: Ambulatory Visit | Attending: Internal Medicine | Admitting: Internal Medicine

## 2015-12-12 ENCOUNTER — Other Ambulatory Visit (HOSPITAL_COMMUNITY): Payer: Self-pay | Admitting: *Deleted

## 2015-12-12 DIAGNOSIS — I5032 Chronic diastolic (congestive) heart failure: Secondary | ICD-10-CM

## 2015-12-12 DIAGNOSIS — I5023 Acute on chronic systolic (congestive) heart failure: Secondary | ICD-10-CM | POA: Diagnosis not present

## 2015-12-12 LAB — BASIC METABOLIC PANEL
Anion gap: 11 (ref 5–15)
BUN: 83 mg/dL — AB (ref 6–20)
CALCIUM: 9.6 mg/dL (ref 8.9–10.3)
CHLORIDE: 88 mmol/L — AB (ref 101–111)
CO2: 35 mmol/L — ABNORMAL HIGH (ref 22–32)
CREATININE: 1.91 mg/dL — AB (ref 0.44–1.00)
GFR calc non Af Amer: 24 mL/min — ABNORMAL LOW (ref >60)
GFR, EST AFRICAN AMERICAN: 28 mL/min — AB (ref >60)
Glucose, Bld: 100 mg/dL — ABNORMAL HIGH (ref 65–99)
Potassium: 2.6 mmol/L — CL (ref 3.5–5.1)
SODIUM: 134 mmol/L — AB (ref 135–145)

## 2015-12-18 ENCOUNTER — Ambulatory Visit (INDEPENDENT_AMBULATORY_CARE_PROVIDER_SITE_OTHER): Payer: Medicare Other | Admitting: Cardiovascular Disease

## 2015-12-18 ENCOUNTER — Encounter: Payer: Self-pay | Admitting: Cardiovascular Disease

## 2015-12-18 VITALS — BP 102/60 | HR 66 | Ht 64.0 in | Wt 157.0 lb

## 2015-12-18 DIAGNOSIS — I5032 Chronic diastolic (congestive) heart failure: Secondary | ICD-10-CM | POA: Diagnosis not present

## 2015-12-18 DIAGNOSIS — I890 Lymphedema, not elsewhere classified: Secondary | ICD-10-CM | POA: Diagnosis not present

## 2015-12-18 DIAGNOSIS — I482 Chronic atrial fibrillation, unspecified: Secondary | ICD-10-CM

## 2015-12-18 DIAGNOSIS — N183 Chronic kidney disease, stage 3 unspecified: Secondary | ICD-10-CM

## 2015-12-18 DIAGNOSIS — E7800 Pure hypercholesterolemia, unspecified: Secondary | ICD-10-CM | POA: Diagnosis not present

## 2015-12-18 DIAGNOSIS — I2781 Cor pulmonale (chronic): Secondary | ICD-10-CM | POA: Diagnosis not present

## 2015-12-18 DIAGNOSIS — I2510 Atherosclerotic heart disease of native coronary artery without angina pectoris: Secondary | ICD-10-CM

## 2015-12-18 LAB — BASIC METABOLIC PANEL
BUN: 84 mg/dL — AB (ref 7–25)
CALCIUM: 9.7 mg/dL (ref 8.6–10.4)
CO2: 34 mmol/L — AB (ref 20–31)
Chloride: 92 mmol/L — ABNORMAL LOW (ref 98–110)
Creat: 1.88 mg/dL — ABNORMAL HIGH (ref 0.60–0.93)
GLUCOSE: 93 mg/dL (ref 65–99)
Potassium: 3.2 mmol/L — ABNORMAL LOW (ref 3.5–5.3)
SODIUM: 137 mmol/L (ref 135–146)

## 2015-12-18 NOTE — Patient Instructions (Signed)
Dr Sallyanne Kuster recommends that you continue on your current medications as directed. Please refer to the Current Medication list given to you today.  Your physician recommends that you return for lab work TODAY.  Dr Sallyanne Kuster recommends that you schedule a follow-up appointment in 1 month.  If you need a refill on your cardiac medications before your next appointment, please call your pharmacy.

## 2015-12-18 NOTE — Progress Notes (Signed)
Cardiology Office Note    Date:  12/18/2015   ID:  Victoria Fisher, DOB 09/15/1934, MRN [id number]  PCP:  Haywood Pao, MD  Cardiologist:   Sanda Klein, MD   Chief Complaint  Patient presents with  . Follow-up    4-6 weeks  . Dizziness  . Shortness of Breath  . Edema    Legs, feet, and ankles.    History of Present Illness:  Victoria Fisher is a 81 y.o. female with permanent atrial fibrillation, diastolic heart failure, mild coronary artery disease, COPD and chronic kidney disease stage III as well as leg lymphedema presents for follow-up After increasing her dose of diuretic.  She has lost about 25 pounds of fluid, but diuretic dose had to be curtailed due to severe hypokalemia (potassium 2.1). Even after increasing the potassium supplements and cutting the dose of thiazide diuretic in half, her potassium is still very low at 2.6 and the creatinine is elevated at 1.9. She remains short of breath and weak. She complains of leg cramps and leg muscle tenderness. She has had several falls.  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 This is likely due to long-standing untreated obstructive sleep apnea and secondary pulmonary hypertension. She has moderate to severe tricuspid regurgitation.  Echo performed October 23 confirms that she still has normal left ventricular systolic function with marked biatrial enlargement dilated right ventricle and severe tricuspid regurgitation. The estimated systolic PA pressure was only mildly elevated.  Past Medical History:  Diagnosis Date  . Abnormality of gait 01/30/2015  . Anemia in CKD (chronic kidney disease)   . Anxiety   . Arthritis   . Asthma    On nebulizer  . Atrial fibrillation, chronic ([place])    a. Rate controlled w/ BB. No OAC 2/2 falls and gait instability.  . Bowel obstruction    Caused by scar tissue  . Chronic cor pulmonale (HCC)     Most likely due to COPD (cannot rule out contribution of diastolic LV dysfunction). ECHO 07/30/11 pulmonary artery pressure was estimated to be 50-60% mmHg & there was moderate tricuspid insufficiency.  . Chronic diastolic CHF (congestive heart failure) ([place])    a. Predominantly right heart failure due to cor pulmonale & in turn this is most likely due to untreated OSA;  b. 05/2013 Echo: EF 55-60%, no rwma, mild to mod MR, sev dil LA, mod to sev dil RV w/ reduced systolic fxn, massively dil RA, mod-sev TR, PASP 39mmHg.  [name] Chronic pain syndrome   . Colon cancer ([place])    Carcinoma of sigmoid colon 1984 & 1985.  [name] COPD (chronic obstructive pulmonary disease) ([place])   . Coronary artery disease    a. occluded proximal diagonal artery on angiography 2004  . Depression   . Diverticulosis of colon   . 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 Dyspnea on exertion    Reluctant to take diuretics  . Edema of both legs    Venous Doppler 05/11/12 was normal & no evidence of thrombus or thrombophlebitis. Chronic LE edema related to both cor pulmonale & lymphedema, hyperlipidemia & HTN involving coronary atherosclerosis by cardiac cath in 2004. Cath showed a tiny old occlusion of the optional diagonal branch & also proximal LAD.  [name] Frequent falls    Possibly due to orthostatic hypotension but we have not seen that on our visits. Recent fall 05/2012 with scalp  hematoma, swollen left LE, & black eyes.  . Hyperlipidemia    On a statin. Catheterization 02/12/03 Showed a tiny old occlusion of the optional diagonal branch & also proximal LAD.  [name] Hypertension   . Hypothyroidism 2002   On hormone replacement therapy  . IBS (irritable bowel syndrome)   . Insomnia   . Kidney failure 09/05/2015   Pt states that she went to the doctor on thursday and was told she is in stage 4 kidney failure  . Lung nodule   . Milroy's disease    Lymphatic disorder diagnosed at [facility]  . Obstructive sleep apnea    Polysomnogram 09/25/11  AHI: 53.6/hr overall & 57.1/hr during REM sleep. Poor compliance previously with CPAP  . Pulmonary hypertension    Pullmonary hypertension, chronic cor pulmonale w/pulmonary artery pressures of 50&#x2011; 60 mmHg.  . Tuberculosis     Past Surgical History:  Procedure Laterality Date  . ABDOMINAL HYSTERECTOMY  1983  . APPENDECTOMY  1956  . CARDIAC CATHETERIZATION  02/12/03   Showed a tiny old occlusion of the optional diagonal branch & also proximal LAD.  [name] CHOLECYSTECTOMY  1984  . COLON BIOPSY    . COLON RESECTION     2 times  . COLONOSCOPY  07/23/10    Current Medications: Outpatient Medications Prior to Visit  Medication Sig Dispense Refill  . aspirin EC 81 MG tablet Take 81 mg by mouth daily.     [name] atorvastatin (LIPITOR) 40 MG tablet Take 40 mg by mouth daily after supper.     . calcitRIOL (ROCALTROL) 0.25 MCG capsule Take 1 capsule (0.25 mcg total) by mouth daily. 30 capsule 1  . Cholecalciferol (VITAMIN D) 2000 units CAPS Take 2,000 Units by mouth daily.    . Darbepoetin Alfa (ARANESP) 100 MCG/0.5ML SOSY injection Inject 0.5 mLs (100 mcg total) into the skin every Friday at 6 PM. 4.2 mL 0  . docusate sodium (COLACE) 100 MG capsule Take 100 mg by mouth 2 (two) times daily.    . ferrous sulfate 325 (65 FE) MG tablet Take 325 mg by mouth daily after supper.     . levalbuterol (XOPENEX HFA) 45 MCG/ACT inhaler Inhale 1 puff into the lungs every 4 (four) hours as needed for wheezing or shortness of breath.     . levothyroxine (SYNTHROID, LEVOTHROID) 112 MCG tablet Take 1 tablet (112 mcg total) by mouth every morning. 30 tablet 6  . magnesium oxide (MAG-OX) 400 (241.3 MG) MG tablet Take 1 tablet (400 mg total) by mouth daily. 30 tablet 5  . metolazone (ZAROXOLYN) 5 MG tablet Take 1 tablet (5 mg total) by mouth daily. 30 tablet 11  . metoprolol succinate (TOPROL-XL) 50 MG 24 hr tablet Take 50 mg by mouth daily. Take with or immediately following a meal.    . mometasone (NASONEX) 50  MCG/ACT nasal spray Place 1 spray into the nose daily as needed (congestion).    . morphine (MS CONTIN) 30 MG 12 hr tablet Take 1 tablet (30 mg total) by mouth 2 (two) times daily. (Patient taking differently: Take 30 mg by mouth See admin instructions. Take 1 tablet (30 mg) by mouth twice daily, may also take 1/2 tablet (15 mg) 3 times daily as needed for breakthrough pain) 8 tablet 0  . morphine (MSIR) 15 MG tablet Take 1 tablet (15 mg total) by mouth 3 (three) times daily as needed (breakthrough pain). 8 tablet 0  . nitroGLYCERIN (NITROSTAT) 0.4 MG SL tablet Place 1 tablet (  0.4 mg total) under the tongue every 5 (five) minutes as needed for chest pain. 100 tablet 1  . polyethylene glycol (MIRALAX / GLYCOLAX) packet Take 17 g by mouth 2 (two) times daily as needed (constipation). Mix in 8 oz liquid and drink    . potassium chloride SA (KLOR-CON M20) 20 MEQ tablet Take 2 tablets (40 mEq total) by mouth 2 (two) times daily. 120 tablet 3  . Probiotic Product (PROBIOTIC PO) Take 1 tablet by mouth daily.    [name] senna (SENOKOT) 8.6 MG TABS tablet Take 1 tablet by mouth 3 (three) times daily as needed for mild constipation.    . sertraline (ZOLOFT) 100 MG tablet Take 100 mg by mouth daily.    [name] torsemide (DEMADEX) 100 MG tablet Take 1 tablet (100 mg total) by mouth 2 (two) times daily. 60 tablet 11  . vitamin E 400 UNIT capsule Take 400 Units by mouth daily.     No facility-administered medications prior to visit.      Allergies:   Codeine; Other; Ciprofloxacin; Gabapentin; Latex; Penicillins; and Sulfonamide derivatives   Social History   Social History  . Marital status: Married    Spouse name: Georgiann Mccoy  . Number of children: 3  . Years of education: HS   Occupational History  .  Retired   Social History Main Topics  . Smoking status: Never Smoker  . Smokeless tobacco: Never Used  . Alcohol use No  . Drug use: No  . Sexual activity: No   Other Topics Concern  . None   Social History  Narrative   Patient is married Passenger transport manager) and lives at home with her husband.   Patient is retired.   Patient has three children and her husband has two children.   Patient is right-handed.   Patient drinks very little caffeine (1/2 cup daily)   Patient has a high school education.     Family History:  The patient's family history includes Alzheimer's disease in her mother; Diabetes in her brother; Heart disease in her father; Parkinsonism in her brother.   ROS:   Please see the history of present illness.    ROS All other systems reviewed and are negative.   PHYSICAL EXAM:   VS:  BP 102/60   Pulse 66   Ht 5\' 4"  (1.626 m)   Wt 157 lb (71.2 kg)   BMI 26.95 kg/m    GEN: Well nourished, well developed, in no acute distress . She is alert and oriented and very pleasant but clearly feels poorly and appears weak and frail.  HEENT: normal  Neck: Mildly elevated JVP, on the average 4-5 cm above the sternal angle with very prominent V waves, no carotid bruits, goiter or masses Cardiac: Irregular ; 3/6 holosystolic murmur at the left lower sternal border and no diastolic murmurs, rubs, or gallops. Hard pitting edema all the way to the hips, buttocks and still some edema of the lower abdominal wall, although this has improved. Respiratory:  Diminished in the bases but otherwise clear to auscultation bilaterally, normal work of breathing GI: soft, nontender, nondistended, + BS MS: no deformity or atrophy  Skin: warm and dry, no rash Neuro:  Alert and Oriented x 3, Strength and sensation are intact. Has "dropped head syndrome" with had consistently tilted to the left Psych: euthymic mood, full affect  Wt Readings from Last 3 Encounters:  12/18/15 157 lb (71.2 kg)  12/10/15 158 lb 4 oz (71.8 kg)  11/14/15 180 lb 8.9  oz (81.9 kg)      Studies/Labs Reviewed:   EKG:  EKG is not ordered today.  The ekg ordered 11/15/15  demonstrates atrial fibrillation with controlled rate, incomplete right  bundle branch block and generalized low voltage  Recent Labs: 09/12/2015: Magnesium 2.4 11/15/2015: TSH 7.936 11/17/2015: Platelets 108 12/05/2015: Hemoglobin 11.1 12/10/2015: ALT 10; B Natriuretic Peptide 399.4 12/12/2015: BUN 83; Creatinine, Ser 1.91; Potassium 2.6; Sodium 134   Lipid Panel    Component Value Date/Time   CHOL 59 09/10/2015 0403   TRIG 22 09/10/2015 0403   HDL 32 (L) 09/10/2015 0403   CHOLHDL 1.8 09/10/2015 0403   VLDL 4 09/10/2015 0403   LDLCALC 23 09/10/2015 0403    Additional studies/ records that were reviewed today include:  Notes from CHF clinic  ASSESSMENT:    1. Chronic diastolic CHF (congestive heart failure) ([place])   2. Chronic cor pulmonale (HCC)   3. Lymphedema   4. CKD (chronic kidney disease), stage III   5. Atrial fibrillation, chronic ([place])   6. Coronary artery disease involving native coronary artery of native heart without angina pectoris   7. Pure hypercholesterolemia      PLAN:  In order of problems listed above:  1. CHF:  Fredna still has evidence of anasarca , although she has lost a lot of fluids since her last appointment. She will need more aggressive diuresis, but have to wait for her potassium and renal function to "catch up". We'll continue torsemide and metolazone and the lower dose for now, until her potassium reaches normal range.check labs today. She has an appointment of heart failure clinic tomorrow.  2. Cor pulmonale/pulmonary HTN: At least in part due to years of untreated obstructive sleep apnea, now compounded by severe tricuspid insufficiency. 3. Lymphedema: I think this is currently a secondary component of her severe edema, most of it is hydrostatic edema due to heart failure. 4. CKD: Heart failure seems to be by far her most immediate threats and I would pay less attention to renal parameters while promoting diuresis. We do need to focus on electrolytes to avoid arrhythmia. 5. AFib: Unable to anticoagulate due to fall  risk and bleeding. Rate control is adequate 6. CAD: She has a known occlusion of a proximal diagonal artery with a corresponding ischemic defect on her most recent nuclear stress test. Angina is not a current complaint. 7. HLP: LDL is now extremely low as is the total cholesterol, possibly a sign of hepatic dysfunction due to chronic right heart failure. Will stop her statin and reevaluate.    Medication Adjustments/Labs and Tests Ordered: Current medicines are reviewed at length with the patient today.  Concerns regarding medicines are outlined above.  Medication changes, Labs and Tests ordered today are listed in the Patient Instructions below. Patient Instructions  Dr Sallyanne Kuster recommends that you continue on your current medications as directed. Please refer to the Current Medication list given to you today.  Your physician recommends that you return for lab work TODAY.  Dr Sallyanne Kuster recommends that you schedule a follow-up appointment in 1 month.  If you need a refill on your cardiac medications before your next appointment, please call your pharmacy.    Signed, Sanda Klein, MD  12/18/2015 11:26 AM    [facility] [address], [place], [place]  [address] Phone: [phone]; Fax: [phone]

## 2015-12-19 ENCOUNTER — Ambulatory Visit (HOSPITAL_COMMUNITY)
Admission: RE | Admit: 2015-12-19 | Discharge: 2015-12-19 | Disposition: A | Discharge status: Home / Self Care | Payer: Medicare Other | Source: Ambulatory Visit | Attending: Internal Medicine | Admitting: Internal Medicine

## 2015-12-19 VITALS — BP 92/58 | HR 78 | Wt 157.8 lb

## 2015-12-19 DIAGNOSIS — I2510 Atherosclerotic heart disease of native coronary artery without angina pectoris: Secondary | ICD-10-CM | POA: Diagnosis not present

## 2015-12-19 DIAGNOSIS — I890 Lymphedema, not elsewhere classified: Secondary | ICD-10-CM | POA: Insufficient documentation

## 2015-12-19 DIAGNOSIS — G243 Spasmodic torticollis: Secondary | ICD-10-CM

## 2015-12-19 DIAGNOSIS — Z885 Allergy status to narcotic agent status: Secondary | ICD-10-CM | POA: Diagnosis not present

## 2015-12-19 DIAGNOSIS — R296 Repeated falls: Secondary | ICD-10-CM | POA: Insufficient documentation

## 2015-12-19 DIAGNOSIS — E876 Hypokalemia: Secondary | ICD-10-CM | POA: Diagnosis not present

## 2015-12-19 DIAGNOSIS — Z85038 Personal history of other malignant neoplasm of large intestine: Secondary | ICD-10-CM | POA: Diagnosis not present

## 2015-12-19 DIAGNOSIS — I130 Hypertensive heart and chronic kidney disease with heart failure and stage 1 through stage 4 chronic kidney disease, or unspecified chronic kidney disease: Secondary | ICD-10-CM | POA: Insufficient documentation

## 2015-12-19 DIAGNOSIS — Z880 Allergy status to penicillin: Secondary | ICD-10-CM | POA: Diagnosis not present

## 2015-12-19 DIAGNOSIS — I2781 Cor pulmonale (chronic): Secondary | ICD-10-CM

## 2015-12-19 DIAGNOSIS — Q820 Hereditary lymphedema: Secondary | ICD-10-CM | POA: Insufficient documentation

## 2015-12-19 DIAGNOSIS — Z881 Allergy status to other antibiotic agents status: Secondary | ICD-10-CM | POA: Diagnosis not present

## 2015-12-19 DIAGNOSIS — Z882 Allergy status to sulfonamides status: Secondary | ICD-10-CM | POA: Insufficient documentation

## 2015-12-19 DIAGNOSIS — Z9109 Other allergy status, other than to drugs and biological substances: Secondary | ICD-10-CM | POA: Insufficient documentation

## 2015-12-19 DIAGNOSIS — N183 Chronic kidney disease, stage 3 (moderate): Secondary | ICD-10-CM | POA: Diagnosis not present

## 2015-12-19 DIAGNOSIS — I10 Essential (primary) hypertension: Secondary | ICD-10-CM

## 2015-12-19 DIAGNOSIS — Z91040 Latex allergy status: Secondary | ICD-10-CM | POA: Insufficient documentation

## 2015-12-19 DIAGNOSIS — Z8249 Family history of ischemic heart disease and other diseases of the circulatory system: Secondary | ICD-10-CM | POA: Insufficient documentation

## 2015-12-19 DIAGNOSIS — I482 Chronic atrial fibrillation, unspecified: Secondary | ICD-10-CM

## 2015-12-19 DIAGNOSIS — Z7982 Long term (current) use of aspirin: Secondary | ICD-10-CM | POA: Insufficient documentation

## 2015-12-19 DIAGNOSIS — I5032 Chronic diastolic (congestive) heart failure: Secondary | ICD-10-CM

## 2015-12-19 DIAGNOSIS — E7800 Pure hypercholesterolemia, unspecified: Secondary | ICD-10-CM

## 2015-12-19 DIAGNOSIS — G4733 Obstructive sleep apnea (adult) (pediatric): Secondary | ICD-10-CM | POA: Diagnosis not present

## 2015-12-19 DIAGNOSIS — Z9119 Patient's noncompliance with other medical treatment and regimen: Secondary | ICD-10-CM | POA: Insufficient documentation

## 2015-12-19 DIAGNOSIS — Z833 Family history of diabetes mellitus: Secondary | ICD-10-CM | POA: Diagnosis not present

## 2015-12-19 DIAGNOSIS — I252 Old myocardial infarction: Secondary | ICD-10-CM

## 2015-12-19 LAB — MAGNESIUM: Magnesium: 2.5 mg/dL (ref 1.5–2.5)

## 2015-12-19 MED ORDER — METOLAZONE 5 MG PO TABS: ORAL_TABLET | ORAL | 3 refills | Status: DC

## 2015-12-19 MED ORDER — POTASSIUM CHLORIDE CRYS ER 20 MEQ PO TBCR: 40.0000 meq | EXTENDED_RELEASE_TABLET | Freq: Two times a day (BID) | ORAL | 3 refills | Status: DC

## 2015-12-19 NOTE — Patient Instructions (Addendum)
REDUCE metolazone to 5 mg (1 tablet) once on Monday, Wednesday, and Friday mornings. Take an EXTRA 20 meq (1 tablet) of potassium on these days.  Continue taking potassium 40 meq (2 tabs) twice daily.  Please apply bilateral unna boots.  Labs in 1 week.  Follow up with Dr. Aundra Dubin in 2 months.  Do the following things EVERYDAY: 1) Weigh yourself in the morning before breakfast. Write it down and keep it in a log. 2) Take your medicines as prescribed 3) Eat low salt foods-Limit salt (sodium) to 2000 mg per day.  4) Stay as active as you can everyday 5) Limit all fluids for the day to less than 2 liters

## 2015-12-19 NOTE — Progress Notes (Signed)
Advanced Heart Failure Clinic Note   Referring Physician: Dr. Sallyanne Kuster Primary Care: Dr Osborne Casco Primary Cardiologist: Dr. Sallyanne Kuster Nephrology: Dr. Lorrene Reid  HPI:  Victoria Fisher is a 81 y.o. female with permanent Afib not on [facility] with frequent falls, Mild CAD, COPD, CKD stage III, chronic lymphedema, OSA, Milroys disease (lymphatic disorder), and Chronic diastolic CHF with RV failure.   Echo 12/09/15 LVEF 65-70%, RV severely dilated with "normal systolic function". Severe Tricuspid Regurg, PA peak pressure 34 mm Hg.   Last seen in Dr. Sallyanne Kuster office 11/21/15. Switched from lasix to high dose torsemide + metolazone.   She presents today for regular follow up. Husband and family friend. Still getting tired very easily but other than that feeling OK. Still falling several times throughout a week, on occasion multiple times a day.  Gets bruised. Gets SOB/fatigued getting ready for bed in the evening, but recovers quickly. Denies SOB with bathing. Using walker most of the time, still falls with it, loses balance. No CP. Occasional lightheadedness with rapid standing, but not causative of falls.  Main component is imbalance. She receives well spring HHRN.  Needs all prescriptions written in the future.   Social: Lives with husband in [place] at [place].  Has HHRN for Tue and Thurs 10-3pm. Has someone from church who helps her most other days.   Labs (11/17/15): creatinine 1.36, BUN 31 Labs (12/10/15): K 2.1, creatinine 1.78, BUN 85 Labs (12/18/15): K 3.2, Creatinine 1.88, BUN 84  PMH: 1. Chronic diastolic HF with prominent RV failure: Echo (10/17) with EF 65-70%, D-shaped interventricular septum suggestive of RV pressure/volume overload, RV severely dilated with normal systolic function, severe TR.   2. Chronic Afib: No anticoag with frequent falls. 3. CAD: She has a known occlusion of a small diagonal artery with a corresponding ischemic defect on her most recent nuclear  stress test. Angina is not a current complaint. 4. CKD stage III: Followed by Dr. Lorrene Reid.  5. 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  6. OSA: cannot tolerate CPAP.  7. History of colon cancer 8. HTN  Review of systems otherwise normal apart from HPI.    Current Outpatient Prescriptions  Medication Sig Dispense Refill  . aspirin EC 81 MG tablet Take 81 mg by mouth daily.     [name] atorvastatin (LIPITOR) 40 MG tablet Take 40 mg by mouth daily after supper.     . calcitRIOL (ROCALTROL) 0.25 MCG capsule Take 1 capsule (0.25 mcg total) by mouth daily. 30 capsule 1  . Cholecalciferol (VITAMIN D) 2000 units CAPS Take 2,000 Units by mouth daily.    . Darbepoetin Alfa (ARANESP) 100 MCG/0.5ML SOSY injection Inject 0.5 mLs (100 mcg total) into the skin every Friday at 6 PM. 4.2 mL 0  . docusate sodium (COLACE) 100 MG capsule Take 100 mg by mouth 2 (two) times daily.    . ferrous sulfate 325 (65 FE) MG tablet Take 325 mg by mouth daily after supper.     . levalbuterol (XOPENEX HFA) 45 MCG/ACT inhaler Inhale 1 puff into the lungs every 4 (four) hours as needed for wheezing or shortness of breath.     . levothyroxine (SYNTHROID, LEVOTHROID) 112 MCG tablet Take 1 tablet (112 mcg total) by mouth every morning. 30 tablet 6  . magnesium oxide (MAG-OX) 400 (241.3 MG) MG tablet Take 1 tablet (400 mg total) by mouth daily. 30 tablet 5  . metoprolol succinate (TOPROL-XL) 50 MG 24 hr tablet  Take 50 mg by mouth daily. Take with or immediately following a meal.    . mometasone (NASONEX) 50 MCG/ACT nasal spray Place 1 spray into the nose daily as needed (congestion).    . morphine (MS CONTIN) 30 MG 12 hr tablet Take 1 tablet (30 mg total) by mouth 2 (two) times daily. (Patient taking differently: Take 30 mg by mouth See admin instructions. Take 1 tablet (30 mg) by mouth twice daily, may also take 1/2 tablet (15 mg) 3 times daily as needed for breakthrough pain) 8  tablet 0  . morphine (MSIR) 15 MG tablet Take 1 tablet (15 mg total) by mouth 3 (three) times daily as needed (breakthrough pain). 8 tablet 0  . nitroGLYCERIN (NITROSTAT) 0.4 MG SL tablet Place 1 tablet (0.4 mg total) under the tongue every 5 (five) minutes as needed for chest pain. 100 tablet 1  . polyethylene glycol (MIRALAX / GLYCOLAX) packet Take 17 g by mouth 2 (two) times daily as needed (constipation). Mix in 8 oz liquid and drink    . potassium chloride SA (KLOR-CON M20) 20 MEQ tablet Take 2 tablets (40 mEq total) by mouth 2 (two) times daily. Take an EXTRA 20 meq (1 tablet) on Monday Wednesday and Fridays with metolazone. 75 tablet 3  . Probiotic Product (PROBIOTIC PO) Take 1 tablet by mouth daily.    [name] senna (SENOKOT) 8.6 MG TABS tablet Take 1 tablet by mouth 3 (three) times daily as needed for mild constipation.    . sertraline (ZOLOFT) 100 MG tablet Take 100 mg by mouth daily.    [name] torsemide (DEMADEX) 100 MG tablet Take 1 tablet (100 mg total) by mouth 2 (two) times daily. 60 tablet 11  . vitamin E 400 UNIT capsule Take 400 Units by mouth daily.    . metolazone (ZAROXOLYN) 5 MG tablet Take 5 mg (1 tablet) once daily on Monday Wednesday and Friday only 45 tablet 3   No current facility-administered medications for this encounter.     Allergies  Allergen Reactions  . Codeine Anaphylaxis, Hives and Nausea And Vomiting  . Other Other (See Comments)    Will not accept blood or blood products - patient is Jehovah's Witness  . Ciprofloxacin Hives and Nausea And Vomiting  . Gabapentin Swelling  . Latex Hives and Rash  . Penicillins Hives    Has patient had a PCN reaction causing immediate rash, facial/tongue/throat swelling, SOB or lightheadedness with hypotension: Yes Has patient had a PCN reaction causing severe rash involving mucus membranes or skin necrosis: Yes Has patient had a PCN reaction that required hospitalization Yes Has patient had a PCN reaction occurring within the last  10 years: Yes If all of the above answers are "NO", then may proceed with Cephalosporin use.  . Sulfonamide Derivatives Hives and Rash      Social History   Social History  . Marital status: Married    Spouse name: Georgiann Mccoy  . Number of children: 3  . Years of education: HS   Occupational History  .  Retired   Social History Main Topics  . Smoking status: Never Smoker  . Smokeless tobacco: Never Used  . Alcohol use No  . Drug use: No  . Sexual activity: No   Other Topics Concern  . Not on file   Social History Narrative   Patient is married Georgiann Mccoy) and lives at home with her husband.   Patient is retired.   Patient has three children and her husband has  two children.   Patient is right-handed.   Patient drinks very little caffeine (1/2 cup daily)   Patient has a high school education.      Family History  Problem Relation Age of Onset  . Heart disease Father   . Alzheimer's disease Mother   . Parkinsonism Brother   . Diabetes Brother     Vitals:   12/19/15 1143  BP: (!) 92/58  Pulse: 78  SpO2: 98%  Weight: 157 lb 12.8 oz (71.6 kg)   Wt Readings from Last 3 Encounters:  12/19/15 157 lb 12.8 oz (71.6 kg)  12/18/15 157 lb (71.2 kg)  12/10/15 158 lb 4 oz (71.8 kg)     PHYSICAL EXAM: General: Elderly, chronically ill, and fatigued appearing. Dropped head syndrome.  HEENT: Minimal muscle control of neck.   Neck: supple. JVD 12-14 cm with prominent CV waves. Carotids 2+ bilat; no bruits. No thyromegaly or nodule noted.  Cor: PMI nondisplaced. Irregularly irregular. 3/6 TR Murmur Lungs: Decreased basilar sounds.  Abdomen: Flanks edematous.  NT, mildly distended, no HSM. No bruits or masses. +BS  Extremities: no cyanosis, clubbing, rash. Chronic 2-3 + woody edema into flanks.   Neuro: alert & oriented x 3, cranial nerves grossly intact. moves all 4 extremities w/o difficulty. Affect pleasant.  ASSESSMENT & PLAN:   1. Chronic diastolic HF with prominent RV  failure: She has been on torsemide 100 bid + metolazone since 10/5.   - Volume status remains elevated, but has slightly improved. Complicated picture due to Milroy's disease.  - Labs done yesterday show K improved to 3.2, but remains low.  Pt has not been taking any extra potassium on Metolazone days.  - Continue torsemide 100 mg BID and Potassium 40 meq BID. - Will have patient take her metolazone 3 times a week (M/W/F) and will add 20 meq of potassium to these days.  - Will recheck BMET next week. - Will again write for UNNA boots.  2. Chronic Afib: No anticoag with frequent falls. - Toprol XL for rate control. 3. CAD: She has a known occlusion of a proximal diagonal artery by cath 2004 with a corresponding ischemic defect on her most recent nuclear stress test per Dr Sallyanne Kuster. No CP currently.   - Continue ASA 81 and statin.  4. CKD stage III:  - Creatinine remains elevated.  Adjusting diuretics as above.  5. 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  - Will again right for UNNA boots.  6. OSA - Non-compliant with CPAP.  - Unwilling to try CPAP.  7. Frequent Falls:  - Again suspect that she would benefit from a higher level of care. Pt wishes to remain in current living situation with her husband right now.    8. Hypokalemia - Adding 20 meq to metolazone days as above. BMET next week.   Recheck labs next week.   As she is also seeing Dr. Sallyanne Kuster, will push back our appointments slightly.   If labs remain difficult or patient has change in symptoms, will bring in sooner.   BMET 7 days. Metolazone 5 mg M/W/F with an extra 20 meq of potassium. UNNA boots. Plan follow up 2 months for now as long as stable. She sees Dr. Sallyanne Kuster in 4 weeks.   Legrand Como "Jonni Sanger" La Villita, PA-C 12/19/2015   Total time spent > 25 minutes. Over half that spent discussing the above.

## 2015-12-20 ENCOUNTER — Telehealth: Payer: Self-pay | Admitting: Cardiovascular Disease

## 2015-12-20 NOTE — Telephone Encounter (Signed)
No means to reach caller. Number given is for a fax.

## 2015-12-20 NOTE — Telephone Encounter (Signed)
New message   Nessa verbalized that she is calling to see if pt is stable enough to have PT a verbal order is needed

## 2015-12-23 ENCOUNTER — Telehealth: Payer: Self-pay | Admitting: Cardiovascular Disease

## 2015-12-23 NOTE — Telephone Encounter (Signed)
Good. Thanks MCr 

## 2015-12-23 NOTE — Telephone Encounter (Signed)
New message  Lorriane Shire from [facility] call requesting to speak with RN about getting a verbal order for pt to state Physical Therapy. Please call back to discuss

## 2015-12-23 NOTE — Telephone Encounter (Signed)
Returned call. Spoke to [place], PT with [facility]. She follows patient's husband Mr. Melley in home.  She voiced that she is trying to get new consult order essentially to reattempt PT evaluation for this patient. Notes that the last time she had tried to work with patient, she was "as weak as a Risk manager" and had ongoing acute concerns. Now notes patient doing better with CHF clinic and interventions. Gave verbal for initial visit. Asked her to fax orders for signature and requested care plan to our clinic fax number. She acknowledged.

## 2015-12-26 ENCOUNTER — Ambulatory Visit (HOSPITAL_COMMUNITY)
Admission: RE | Admit: 2015-12-26 | Discharge: 2015-12-26 | Disposition: A | Discharge status: Home / Self Care | Payer: Medicare Other | Source: Ambulatory Visit | Attending: Internal Medicine | Admitting: Internal Medicine

## 2015-12-26 ENCOUNTER — Other Ambulatory Visit (HOSPITAL_COMMUNITY): Payer: Self-pay

## 2015-12-26 DIAGNOSIS — I5022 Chronic systolic (congestive) heart failure: Secondary | ICD-10-CM | POA: Insufficient documentation

## 2015-12-26 LAB — BASIC METABOLIC PANEL
ANION GAP: 13 (ref 5–15)
BUN: 85 mg/dL — ABNORMAL HIGH (ref 6–20)
CALCIUM: 10.2 mg/dL (ref 8.9–10.3)
CO2: 33 mmol/L — AB (ref 22–32)
CREATININE: 1.61 mg/dL — AB (ref 0.44–1.00)
Chloride: 92 mmol/L — ABNORMAL LOW (ref 101–111)
GFR, EST AFRICAN AMERICAN: 34 mL/min — AB (ref >60)
GFR, EST NON AFRICAN AMERICAN: 29 mL/min — AB (ref >60)
Glucose, Bld: 95 mg/dL (ref 65–99)
Potassium: 3 mmol/L — ABNORMAL LOW (ref 3.5–5.1)
SODIUM: 138 mmol/L (ref 135–145)

## 2015-12-31 ENCOUNTER — Telehealth (HOSPITAL_COMMUNITY): Payer: Self-pay | Admitting: Cardiology

## 2015-12-31 DIAGNOSIS — I509 Heart failure, unspecified: Secondary | ICD-10-CM

## 2015-12-31 MED ORDER — POTASSIUM CHLORIDE CRYS ER 20 MEQ PO TBCR: 60.0000 meq | EXTENDED_RELEASE_TABLET | Freq: Two times a day (BID) | ORAL | 3 refills | Status: DC

## 2015-12-31 NOTE — Telephone Encounter (Signed)
[phone] (H) patient aware and voiced understanding. Recheck labs 11/22 @1130 . Patient is active with [facility]- Medication Management attn : Tory Emerald [phone] f: [phone]

## 2015-12-31 NOTE — Telephone Encounter (Signed)
-----   Message from Shirley Friar, PA-C sent at 12/30/2015  3:38 PM EST ----- Please increase K to 60 meq BID.      Needs recheck 1 week.   Legrand Como [address] [place], PA-C 12/30/2015 3:38 PM

## 2016-01-01 ENCOUNTER — Telehealth: Payer: Self-pay | Admitting: Cardiovascular Disease

## 2016-01-01 DIAGNOSIS — Q820 Hereditary lymphedema: Secondary | ICD-10-CM | POA: Diagnosis not present

## 2016-01-01 DIAGNOSIS — L03115 Cellulitis of right lower limb: Secondary | ICD-10-CM | POA: Diagnosis not present

## 2016-01-01 DIAGNOSIS — I509 Heart failure, unspecified: Secondary | ICD-10-CM | POA: Diagnosis not present

## 2016-01-01 DIAGNOSIS — E7800 Pure hypercholesterolemia, unspecified: Secondary | ICD-10-CM | POA: Diagnosis not present

## 2016-01-01 DIAGNOSIS — Z6825 Body mass index (BMI) 25.0-25.9, adult: Secondary | ICD-10-CM | POA: Diagnosis not present

## 2016-01-01 NOTE — Telephone Encounter (Signed)
Victoria Fisher ref with [facility] they need a verbal for  PT, OT and RN visits to this pt.  [place]  [phone]

## 2016-01-01 NOTE — Telephone Encounter (Signed)
Yes, please go ahead with those services. MCr

## 2016-01-01 NOTE — Telephone Encounter (Signed)
Victoria Fisher at [facility] notified orders are ok

## 2016-01-02 ENCOUNTER — Encounter (HOSPITAL_COMMUNITY)
Admission: RE | Admit: 2016-01-02 | Discharge: 2016-01-02 | Disposition: A | Discharge status: Home / Self Care | Payer: Medicare Other | Source: Ambulatory Visit | Attending: Nephrology | Admitting: Nephrology

## 2016-01-02 ENCOUNTER — Telehealth: Payer: Self-pay | Admitting: Cardiovascular Disease

## 2016-01-02 DIAGNOSIS — N183 Chronic kidney disease, stage 3 unspecified: Secondary | ICD-10-CM

## 2016-01-02 DIAGNOSIS — D638 Anemia in other chronic diseases classified elsewhere: Secondary | ICD-10-CM | POA: Diagnosis not present

## 2016-01-02 DIAGNOSIS — N184 Chronic kidney disease, stage 4 (severe): Secondary | ICD-10-CM | POA: Insufficient documentation

## 2016-01-02 LAB — IRON AND TIBC
Iron: 57 ug/dL (ref 28–170)
Saturation Ratios: 18 % (ref 10.4–31.8)
TIBC: 316 ug/dL (ref 250–450)
UIBC: 259 ug/dL

## 2016-01-02 LAB — FERRITIN: FERRITIN: 195 ng/mL (ref 11–307)

## 2016-01-02 LAB — POCT HEMOGLOBIN-HEMACUE: Hemoglobin: 11.6 g/dL — ABNORMAL LOW (ref 12.0–15.0)

## 2016-01-02 MED ORDER — DARBEPOETIN ALFA 100 MCG/0.5ML IJ SOSY
100.0000 ug | PREFILLED_SYRINGE | INTRAMUSCULAR | Status: DC
  Administered 2016-01-02: 100 ug via SUBCUTANEOUS

## 2016-01-02 MED ORDER — DARBEPOETIN ALFA 100 MCG/0.5ML IJ SOSY
PREFILLED_SYRINGE | INTRAMUSCULAR | Status: AC
  Administered 2016-01-02: 100 ug via SUBCUTANEOUS
  Filled 2016-01-02: qty 0.5

## 2016-01-02 NOTE — Telephone Encounter (Signed)
LMTCB-Vanessa  Per yesterday's telephone message(ok per Dr Sallyanne Kuster):  Yes, please go ahead with those services. MCr

## 2016-01-02 NOTE — Telephone Encounter (Signed)
Lorriane Shire is calling to get a verbal order for Nursing , OT and PT . Please call   Thanks

## 2016-01-03 NOTE — Telephone Encounter (Signed)
Number to call is not the correct number to vanessa.

## 2016-01-05 ENCOUNTER — Emergency Department (HOSPITAL_COMMUNITY): Payer: Medicare Other

## 2016-01-05 ENCOUNTER — Telehealth: Payer: Self-pay | Admitting: Physician Assistant

## 2016-01-05 ENCOUNTER — Inpatient Hospital Stay (HOSPITAL_COMMUNITY)
Admission: EM | Admit: 2016-01-05 | Discharge: 2016-01-09 | DRG: 291 | Disposition: A | Discharge status: Home Health | Payer: Medicare Other | Attending: Internal Medicine | Admitting: Internal Medicine

## 2016-01-05 ENCOUNTER — Encounter (HOSPITAL_COMMUNITY): Payer: Self-pay

## 2016-01-05 DIAGNOSIS — Q820 Hereditary lymphedema: Secondary | ICD-10-CM

## 2016-01-05 DIAGNOSIS — Z90710 Acquired absence of both cervix and uterus: Secondary | ICD-10-CM

## 2016-01-05 DIAGNOSIS — Z885 Allergy status to narcotic agent status: Secondary | ICD-10-CM

## 2016-01-05 DIAGNOSIS — Z85038 Personal history of other malignant neoplasm of large intestine: Secondary | ICD-10-CM

## 2016-01-05 DIAGNOSIS — I2729 Other secondary pulmonary hypertension: Secondary | ICD-10-CM | POA: Diagnosis present

## 2016-01-05 DIAGNOSIS — Z9049 Acquired absence of other specified parts of digestive tract: Secondary | ICD-10-CM

## 2016-01-05 DIAGNOSIS — R600 Localized edema: Secondary | ICD-10-CM

## 2016-01-05 DIAGNOSIS — I2781 Cor pulmonale (chronic): Secondary | ICD-10-CM | POA: Diagnosis not present

## 2016-01-05 DIAGNOSIS — I2510 Atherosclerotic heart disease of native coronary artery without angina pectoris: Secondary | ICD-10-CM | POA: Diagnosis present

## 2016-01-05 DIAGNOSIS — I110 Hypertensive heart disease with heart failure: Secondary | ICD-10-CM | POA: Diagnosis not present

## 2016-01-05 DIAGNOSIS — R0602 Shortness of breath: Secondary | ICD-10-CM | POA: Diagnosis not present

## 2016-01-05 DIAGNOSIS — Z880 Allergy status to penicillin: Secondary | ICD-10-CM

## 2016-01-05 DIAGNOSIS — I482 Chronic atrial fibrillation, unspecified: Secondary | ICD-10-CM | POA: Diagnosis present

## 2016-01-05 DIAGNOSIS — Z881 Allergy status to other antibiotic agents status: Secondary | ICD-10-CM

## 2016-01-05 DIAGNOSIS — I509 Heart failure, unspecified: Secondary | ICD-10-CM | POA: Diagnosis not present

## 2016-01-05 DIAGNOSIS — Z79891 Long term (current) use of opiate analgesic: Secondary | ICD-10-CM

## 2016-01-05 DIAGNOSIS — G894 Chronic pain syndrome: Secondary | ICD-10-CM | POA: Diagnosis present

## 2016-01-05 DIAGNOSIS — N183 Chronic kidney disease, stage 3 unspecified: Secondary | ICD-10-CM | POA: Diagnosis present

## 2016-01-05 DIAGNOSIS — Z7982 Long term (current) use of aspirin: Secondary | ICD-10-CM

## 2016-01-05 DIAGNOSIS — M6281 Muscle weakness (generalized): Secondary | ICD-10-CM

## 2016-01-05 DIAGNOSIS — F32A Depression, unspecified: Secondary | ICD-10-CM | POA: Diagnosis present

## 2016-01-05 DIAGNOSIS — Z833 Family history of diabetes mellitus: Secondary | ICD-10-CM

## 2016-01-05 DIAGNOSIS — I5033 Acute on chronic diastolic (congestive) heart failure: Secondary | ICD-10-CM | POA: Diagnosis present

## 2016-01-05 DIAGNOSIS — F329 Major depressive disorder, single episode, unspecified: Secondary | ICD-10-CM | POA: Diagnosis present

## 2016-01-05 DIAGNOSIS — E875 Hyperkalemia: Secondary | ICD-10-CM | POA: Diagnosis not present

## 2016-01-05 DIAGNOSIS — D631 Anemia in chronic kidney disease: Secondary | ICD-10-CM | POA: Diagnosis present

## 2016-01-05 DIAGNOSIS — F419 Anxiety disorder, unspecified: Secondary | ICD-10-CM | POA: Diagnosis present

## 2016-01-05 DIAGNOSIS — G4733 Obstructive sleep apnea (adult) (pediatric): Secondary | ICD-10-CM | POA: Diagnosis present

## 2016-01-05 DIAGNOSIS — R0789 Other chest pain: Secondary | ICD-10-CM | POA: Diagnosis not present

## 2016-01-05 DIAGNOSIS — E7800 Pure hypercholesterolemia, unspecified: Secondary | ICD-10-CM

## 2016-01-05 DIAGNOSIS — Z9119 Patient's noncompliance with other medical treatment and regimen: Secondary | ICD-10-CM

## 2016-01-05 DIAGNOSIS — J449 Chronic obstructive pulmonary disease, unspecified: Secondary | ICD-10-CM | POA: Diagnosis present

## 2016-01-05 DIAGNOSIS — K5900 Constipation, unspecified: Secondary | ICD-10-CM | POA: Diagnosis present

## 2016-01-05 DIAGNOSIS — E785 Hyperlipidemia, unspecified: Secondary | ICD-10-CM | POA: Diagnosis present

## 2016-01-05 DIAGNOSIS — I10 Essential (primary) hypertension: Secondary | ICD-10-CM | POA: Diagnosis present

## 2016-01-05 DIAGNOSIS — Z79899 Other long term (current) drug therapy: Secondary | ICD-10-CM

## 2016-01-05 DIAGNOSIS — R0600 Dyspnea, unspecified: Secondary | ICD-10-CM | POA: Diagnosis not present

## 2016-01-05 DIAGNOSIS — R296 Repeated falls: Secondary | ICD-10-CM

## 2016-01-05 DIAGNOSIS — E039 Hypothyroidism, unspecified: Secondary | ICD-10-CM | POA: Diagnosis present

## 2016-01-05 DIAGNOSIS — Z820 Family history of epilepsy and other diseases of the nervous system: Secondary | ICD-10-CM

## 2016-01-05 DIAGNOSIS — Z79890 Hormone replacement therapy (postmenopausal): Secondary | ICD-10-CM

## 2016-01-05 DIAGNOSIS — Z8249 Family history of ischemic heart disease and other diseases of the circulatory system: Secondary | ICD-10-CM

## 2016-01-05 DIAGNOSIS — M7989 Other specified soft tissue disorders: Secondary | ICD-10-CM | POA: Diagnosis not present

## 2016-01-05 DIAGNOSIS — Z882 Allergy status to sulfonamides status: Secondary | ICD-10-CM

## 2016-01-05 DIAGNOSIS — I130 Hypertensive heart and chronic kidney disease with heart failure and stage 1 through stage 4 chronic kidney disease, or unspecified chronic kidney disease: Principal | ICD-10-CM | POA: Diagnosis present

## 2016-01-05 DIAGNOSIS — R001 Bradycardia, unspecified: Secondary | ICD-10-CM | POA: Diagnosis present

## 2016-01-05 DIAGNOSIS — I890 Lymphedema, not elsewhere classified: Secondary | ICD-10-CM

## 2016-01-05 DIAGNOSIS — Z91040 Latex allergy status: Secondary | ICD-10-CM

## 2016-01-05 LAB — CBC WITH DIFFERENTIAL/PLATELET
BASOS ABS: 0 10*3/uL (ref 0.0–0.1)
Basophils Relative: 1 %
EOS ABS: 0 10*3/uL (ref 0.0–0.7)
EOS PCT: 1 %
HCT: 33.2 % — ABNORMAL LOW (ref 36.0–46.0)
Hemoglobin: 10.7 g/dL — ABNORMAL LOW (ref 12.0–15.0)
LYMPHS PCT: 22 %
Lymphs Abs: 1 10*3/uL (ref 0.7–4.0)
MCH: 32.6 pg (ref 26.0–34.0)
MCHC: 32.2 g/dL (ref 30.0–36.0)
MCV: 101.2 fL — AB (ref 78.0–100.0)
MONO ABS: 0.5 10*3/uL (ref 0.1–1.0)
Monocytes Relative: 12 %
Neutro Abs: 3 10*3/uL (ref 1.7–7.7)
Neutrophils Relative %: 65 %
PLATELETS: 102 10*3/uL — AB (ref 150–400)
RBC: 3.28 MIL/uL — ABNORMAL LOW (ref 3.87–5.11)
RDW: 16 % — AB (ref 11.5–15.5)
WBC: 4.7 10*3/uL (ref 4.0–10.5)

## 2016-01-05 LAB — BASIC METABOLIC PANEL
Anion gap: 13 (ref 5–15)
BUN: 95 mg/dL — AB (ref 6–20)
CALCIUM: 9.8 mg/dL (ref 8.9–10.3)
CO2: 27 mmol/L (ref 22–32)
CREATININE: 1.73 mg/dL — AB (ref 0.44–1.00)
Chloride: 96 mmol/L — ABNORMAL LOW (ref 101–111)
GFR calc Af Amer: 31 mL/min — ABNORMAL LOW (ref >60)
GFR, EST NON AFRICAN AMERICAN: 27 mL/min — AB (ref >60)
GLUCOSE: 103 mg/dL — AB (ref 65–99)
Potassium: 4.1 mmol/L (ref 3.5–5.1)
SODIUM: 136 mmol/L (ref 135–145)

## 2016-01-05 LAB — TROPONIN I: Troponin I: <0.03 ng/mL (ref <0.03)

## 2016-01-05 LAB — BRAIN NATRIURETIC PEPTIDE: B NATRIURETIC PEPTIDE 5: 468 pg/mL — AB (ref 0.0–100.0)

## 2016-01-05 MED ORDER — ENOXAPARIN SODIUM 30 MG/0.3ML ~~LOC~~ SOLN
30.0000 mg | SUBCUTANEOUS | Status: DC
  Filled 2016-01-05 (×3): qty 0.3

## 2016-01-05 MED ORDER — VITAMIN E 180 MG (400 UNIT) PO CAPS
400.0000 [IU] | ORAL_CAPSULE | Freq: Every day | ORAL | Status: DC
  Administered 2016-01-06 – 2016-01-09 (×4): 400 [IU] via ORAL
  Filled 2016-01-05 (×4): qty 1

## 2016-01-05 MED ORDER — FUROSEMIDE 10 MG/ML IJ SOLN
40.0000 mg | Freq: Once | INTRAMUSCULAR | Status: AC
  Administered 2016-01-06: 40 mg via INTRAVENOUS
  Filled 2016-01-05: qty 4

## 2016-01-05 MED ORDER — DOCUSATE SODIUM 100 MG PO CAPS
100.0000 mg | ORAL_CAPSULE | Freq: Two times a day (BID) | ORAL | Status: DC
  Administered 2016-01-06 – 2016-01-09 (×8): 100 mg via ORAL
  Filled 2016-01-05 (×8): qty 1

## 2016-01-05 MED ORDER — VITAMIN D 1000 UNITS PO TABS
2000.0000 [IU] | ORAL_TABLET | Freq: Every day | ORAL | Status: DC
  Administered 2016-01-06 – 2016-01-09 (×5): 2000 [IU] via ORAL
  Filled 2016-01-05 (×5): qty 2

## 2016-01-05 MED ORDER — POLYETHYLENE GLYCOL 3350 17 G PO PACK
17.0000 g | PACK | Freq: Two times a day (BID) | ORAL | Status: DC
  Administered 2016-01-06: 17 g via ORAL
  Filled 2016-01-05 (×5): qty 1

## 2016-01-05 MED ORDER — MORPHINE SULFATE ER 30 MG PO TBCR
30.0000 mg | EXTENDED_RELEASE_TABLET | Freq: Two times a day (BID) | ORAL | Status: DC
  Administered 2016-01-06 – 2016-01-09 (×8): 30 mg via ORAL
  Filled 2016-01-05 (×8): qty 1

## 2016-01-05 MED ORDER — SODIUM CHLORIDE 0.9 % IV SOLN: 250.0000 mL | INTRAVENOUS | Status: DC | PRN

## 2016-01-05 MED ORDER — ALPRAZOLAM 0.25 MG PO TABS
0.2500 mg | ORAL_TABLET | Freq: Two times a day (BID) | ORAL | Status: DC | PRN
  Administered 2016-01-07 – 2016-01-08 (×3): 0.25 mg via ORAL
  Filled 2016-01-05 (×3): qty 1

## 2016-01-05 MED ORDER — FUROSEMIDE 10 MG/ML IJ SOLN
80.0000 mg | Freq: Two times a day (BID) | INTRAMUSCULAR | Status: DC
  Administered 2016-01-06 – 2016-01-08 (×5): 80 mg via INTRAVENOUS
  Filled 2016-01-05 (×5): qty 8

## 2016-01-05 MED ORDER — CALCITRIOL 0.25 MCG PO CAPS
0.2500 ug | ORAL_CAPSULE | Freq: Every day | ORAL | Status: DC
  Administered 2016-01-06 – 2016-01-09 (×4): 0.25 ug via ORAL
  Filled 2016-01-05 (×4): qty 1

## 2016-01-05 MED ORDER — METOPROLOL SUCCINATE ER 50 MG PO TB24
50.0000 mg | ORAL_TABLET | Freq: Every day | ORAL | Status: DC
  Administered 2016-01-06 – 2016-01-09 (×4): 50 mg via ORAL
  Filled 2016-01-05 (×4): qty 1

## 2016-01-05 MED ORDER — ASPIRIN EC 81 MG PO TBEC
81.0000 mg | DELAYED_RELEASE_TABLET | Freq: Every day | ORAL | Status: DC
  Administered 2016-01-06 – 2016-01-09 (×5): 81 mg via ORAL
  Filled 2016-01-05 (×5): qty 1

## 2016-01-05 MED ORDER — RISAQUAD PO CAPS
1.0000 | ORAL_CAPSULE | Freq: Every day | ORAL | Status: DC
  Administered 2016-01-06 – 2016-01-09 (×4): 1 via ORAL
  Filled 2016-01-05 (×4): qty 1

## 2016-01-05 MED ORDER — MAGNESIUM OXIDE 400 (241.3 MG) MG PO TABS
400.0000 mg | ORAL_TABLET | Freq: Every day | ORAL | Status: DC
  Administered 2016-01-06 – 2016-01-09 (×4): 400 mg via ORAL
  Filled 2016-01-05 (×4): qty 1

## 2016-01-05 MED ORDER — MORPHINE SULFATE 15 MG PO TABS
15.0000 mg | ORAL_TABLET | Freq: Three times a day (TID) | ORAL | Status: DC | PRN
  Administered 2016-01-09: 15 mg via ORAL
  Filled 2016-01-05 (×2): qty 1

## 2016-01-05 MED ORDER — ONDANSETRON HCL 4 MG/2ML IJ SOLN: 4.0000 mg | Freq: Four times a day (QID) | INTRAMUSCULAR | Status: DC | PRN

## 2016-01-05 MED ORDER — ZOLPIDEM TARTRATE 5 MG PO TABS
5.0000 mg | ORAL_TABLET | Freq: Every evening | ORAL | Status: DC | PRN
  Filled 2016-01-05: qty 1

## 2016-01-05 MED ORDER — LEVOTHYROXINE SODIUM 112 MCG PO TABS
112.0000 ug | ORAL_TABLET | Freq: Every day | ORAL | Status: DC
  Administered 2016-01-06 – 2016-01-09 (×4): 112 ug via ORAL
  Filled 2016-01-05 (×4): qty 1

## 2016-01-05 MED ORDER — ACETAMINOPHEN 325 MG PO TABS
650.0000 mg | ORAL_TABLET | ORAL | Status: DC | PRN
  Administered 2016-01-08: 650 mg via ORAL
  Filled 2016-01-05: qty 2

## 2016-01-05 MED ORDER — SERTRALINE HCL 100 MG PO TABS
100.0000 mg | ORAL_TABLET | Freq: Every day | ORAL | Status: DC
  Administered 2016-01-06 – 2016-01-09 (×4): 100 mg via ORAL
  Filled 2016-01-05 (×4): qty 1

## 2016-01-05 MED ORDER — SODIUM CHLORIDE 0.9% FLUSH
3.0000 mL | Freq: Two times a day (BID) | INTRAVENOUS | Status: DC
  Administered 2016-01-06 – 2016-01-09 (×8): 3 mL via INTRAVENOUS

## 2016-01-05 MED ORDER — FUROSEMIDE 10 MG/ML IJ SOLN
40.0000 mg | Freq: Once | INTRAMUSCULAR | Status: AC
  Administered 2016-01-05: 40 mg via INTRAVENOUS
  Filled 2016-01-05: qty 4

## 2016-01-05 MED ORDER — ATORVASTATIN CALCIUM 40 MG PO TABS
40.0000 mg | ORAL_TABLET | Freq: Every day | ORAL | Status: DC
  Administered 2016-01-06 – 2016-01-08 (×3): 40 mg via ORAL
  Filled 2016-01-05 (×3): qty 1

## 2016-01-05 MED ORDER — FERROUS SULFATE 325 (65 FE) MG PO TABS
325.0000 mg | ORAL_TABLET | Freq: Every day | ORAL | Status: DC
  Administered 2016-01-06 – 2016-01-08 (×3): 325 mg via ORAL
  Filled 2016-01-05 (×3): qty 1

## 2016-01-05 MED ORDER — FLUTICASONE PROPIONATE 50 MCG/ACT NA SUSP
1.0000 | Freq: Every day | NASAL | Status: DC
  Administered 2016-01-08 – 2016-01-09 (×2): 1 via NASAL
  Filled 2016-01-05: qty 16

## 2016-01-05 MED ORDER — SODIUM CHLORIDE 0.9% FLUSH: 3.0000 mL | INTRAVENOUS | Status: DC | PRN

## 2016-01-05 MED ORDER — HYDROCODONE-ACETAMINOPHEN 5-325 MG PO TABS
2.0000 | ORAL_TABLET | Freq: Once | ORAL | Status: AC
  Administered 2016-01-05: 2 via ORAL
  Filled 2016-01-05: qty 2

## 2016-01-05 MED ORDER — NITROGLYCERIN 0.4 MG SL SUBL: 0.4000 mg | SUBLINGUAL_TABLET | SUBLINGUAL | Status: DC | PRN

## 2016-01-05 MED ORDER — ALBUTEROL SULFATE (2.5 MG/3ML) 0.083% IN NEBU: 3.0000 mL | INHALATION_SOLUTION | RESPIRATORY_TRACT | Status: DC | PRN

## 2016-01-05 MED ORDER — SENNA 8.6 MG PO TABS
1.0000 | ORAL_TABLET | Freq: Three times a day (TID) | ORAL | Status: DC | PRN
  Filled 2016-01-05: qty 1
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
## 2016-01-05 NOTE — ED Notes (Signed)
Urine sample collected and placed at bedside

## 2016-01-05 NOTE — Telephone Encounter (Signed)
Victoria Fisher is a 81 y.o. female with chronic diastolic CHF, RV failure, CKD, hypokalemia, permanent AFib.  She is followed by Dr. Sanda Klein and the HF clinic. She is on Torsemide 100 bid and Metolazone 5 on MWF. She called in today with worsening LE edema, increasing abdominal girth and worsening shortness of breath.  She is short of breath at rest and notably out of breath on the phone. I have advised her to come to the ED at [facility] for evaluation. She agrees with this plan. Richardson Dopp, PA-C   01/05/2016 2:43 PM

## 2016-01-05 NOTE — ED Triage Notes (Signed)
Pt. BIB [facility] from home. Pt called due to swelling in her legs that extends up into her lower abdomen. Pt also c/o reddened hands that are warm to the touch X1 week. Pt states her legs have been weeping. Pt also c/o not being able to sleep in bed due to SOB. Presents wearing soft collar from a previous fall not r/t this visit. Pt. Also c/o pain in upper belly.

## 2016-01-05 NOTE — H&P (Signed)
History and Physical    [name] [id number] DOB: May 28, 1934 DOA: 01/05/2016  Referring MD/NP/PA:   PCP: Haywood Pao, MD   Patient coming from:  The patient is coming from home.  At baseline, pt is independent for most of ADL.   Chief Complaint: SOB and worsening leg edeam  HPI: Victoria Fisher is a 81 y.o. female with medical history significant of dCHF, lymphedema, atrial fibrillation not on anticoagulant, hypertension, hyperlipidemia, COPD, hypothyroidism, depression, sciatica, OSA not on CPAP, TB, IBS, remote colon cancer, anemia, CKD-III, who presents with shortness present worsening leg edema.  Patient states that her leg edema has been worsening in the past several days. she also has worsening SOB. She can speak in full sentences. Denies cough, chest pain. No fever or chills. She states that her abdomen is swelling up, causing her abdominal tightness, but no abdominal pain, nausea vomiting. She is constipated. Denies symptoms of UTI. No unilateral weakness. No tenderness over calf areas.   ED Course: pt was found to have BNP 468, negative troponin, WBC 4.7, stable renal function, temperature normal, bradycardia, open saturation 95% on room air. Chest x-ray showed vascular congestion. Patient is placed on telemetry bed for observation.  Review of Systems:   General: no fevers, chills, no changes in body weight, has poor appetite, has fatigue HEENT: no blurry vision, hearing changes or sore throat Respiratory: has dyspnea, no coughing, wheezing CV: no chest pain, no palpitations GI: no nausea, vomiting, abdominal pain, diarrhea, constipation GU: no dysuria, burning on urination, increased urinary frequency, hematuria  Ext: has leg edema Neuro: no unilateral weakness, numbness, or tingling, no vision change or hearing loss Skin: no rash, no skin tear. MSK: No muscle spasm, no deformity, no limitation of range of movement in spin Heme: No easy bruising.  Travel  history: No recent long distant travel.  Allergy:  Allergies  Allergen Reactions  . Codeine Anaphylaxis, Hives and Nausea And Vomiting  . Other Other (See Comments)    Will not accept blood or blood products - patient is Jehovah's Witness  . Ciprofloxacin Hives and Nausea And Vomiting  . Gabapentin Swelling  . Latex Hives and Rash  . Penicillins Hives    Has patient had a PCN reaction causing immediate rash, facial/tongue/throat swelling, SOB or lightheadedness with hypotension: Yes Has patient had a PCN reaction causing severe rash involving mucus membranes or skin necrosis: Yes Has patient had a PCN reaction that required hospitalization Yes Has patient had a PCN reaction occurring within the last 10 years: Yes If all of the above answers are "NO", then may proceed with Cephalosporin use.  . Sulfonamide Derivatives Hives and Rash    Past Medical History:  Diagnosis Date  . Abnormality of gait 01/30/2015  . Anemia in CKD (chronic kidney disease)   . Anxiety   . Arthritis   . Asthma    On nebulizer  . Atrial fibrillation, chronic ([place])    a. Rate controlled w/ BB. No OAC 2/2 falls and gait instability.  . Bowel obstruction    Caused by scar tissue  . Chronic cor pulmonale (HCC)    Most likely due to COPD (cannot rule out contribution of diastolic LV dysfunction). ECHO 07/30/11 pulmonary artery pressure was estimated to be 50-60% mmHg & there was moderate tricuspid insufficiency.  . Chronic diastolic CHF (congestive heart failure) ([place])    a. Predominantly right heart failure due to cor pulmonale & in turn this is most likely due to untreated  OSA;  b. 05/2013 Echo: EF 55-60%, no rwma, mild to mod MR, sev dil LA, mod to sev dil RV w/ reduced systolic fxn, massively dil RA, mod-sev TR, PASP 70mmHg.  [name] Chronic pain syndrome   . Colon cancer ([place])    Carcinoma of sigmoid colon 1984 & 1985.  [name] COPD (chronic obstructive pulmonary disease) ([place])   . Coronary artery disease    a.  occluded proximal diagonal artery on angiography 2004  . Depression   . Diverticulosis of colon   . 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 Dyspnea on exertion    Reluctant to take diuretics  . Edema of both legs    Venous Doppler 05/11/12 was normal & no evidence of thrombus or thrombophlebitis. Chronic LE edema related to both cor pulmonale & lymphedema, hyperlipidemia & HTN involving coronary atherosclerosis by cardiac cath in 2004. Cath showed a tiny old occlusion of the optional diagonal branch & also proximal LAD.  [name] Frequent falls    Possibly due to orthostatic hypotension but we have not seen that on our visits. Recent fall 05/2012 with scalp hematoma, swollen left LE, & black eyes.  . Hyperlipidemia    On a statin. Catheterization 02/12/03 Showed a tiny old occlusion of the optional diagonal branch & also proximal LAD.  [name] Hypertension   . Hypothyroidism 2002   On hormone replacement therapy  . IBS (irritable bowel syndrome)   . Insomnia   . Kidney failure 09/05/2015   Pt states that she went to the doctor on thursday and was told she is in stage 4 kidney failure  . Lung nodule   . Milroy's disease    Lymphatic disorder diagnosed at [facility]  . Obstructive sleep apnea    Polysomnogram 09/25/11 AHI: 53.6/hr overall & 57.1/hr during REM sleep. Poor compliance previously with CPAP  . Pulmonary hypertension    Pullmonary hypertension, chronic cor pulmonale w/pulmonary artery pressures of 50&#x2011; 60 mmHg.  . Tuberculosis     Past Surgical History:  Procedure Laterality Date  . ABDOMINAL HYSTERECTOMY  1983  . APPENDECTOMY  1956  . CARDIAC CATHETERIZATION  02/12/03   Showed a tiny old occlusion of the optional diagonal branch & also proximal LAD.  [name] CHOLECYSTECTOMY  1984  . COLON BIOPSY    . COLON RESECTION     2 times  . COLONOSCOPY  07/23/10    Social History:  reports that she has never smoked. She has never used smokeless tobacco. She reports that she does not drink alcohol or use  drugs.  Family History:  Family History  Problem Relation Age of Onset  . Heart disease Father   . Alzheimer's disease Mother   . Parkinsonism Brother   . Diabetes Brother      Prior to Admission medications   Medication Sig Start Date End Date Taking? Authorizing Provider  aspirin EC 81 MG tablet Take 81 mg by mouth daily.     Historical Provider, MD  atorvastatin (LIPITOR) 40 MG tablet Take 40 mg by mouth daily after supper.     Historical Provider, MD  calcitRIOL (ROCALTROL) 0.25 MCG capsule Take 1 capsule (0.25 mcg total) by mouth daily. 11/18/15   Kelvin Cellar, MD  Cholecalciferol (VITAMIN D) 2000 units CAPS Take 2,000 Units by mouth daily.    Historical Provider, MD  Darbepoetin Alfa (ARANESP) 100 MCG/0.5ML SOSY injection Inject 0.5 mLs (100 mcg total) into the skin every Friday at 6 PM. 11/22/15   Kelvin Cellar, MD  docusate sodium (  COLACE) 100 MG capsule Take 100 mg by mouth 2 (two) times daily.    Historical Provider, MD  ferrous sulfate 325 (65 FE) MG tablet Take 325 mg by mouth daily after supper.     Historical Provider, MD  levalbuterol (XOPENEX HFA) 45 MCG/ACT inhaler Inhale 1 puff into the lungs every 4 (four) hours as needed for wheezing or shortness of breath.     Historical Provider, MD  levothyroxine (SYNTHROID, LEVOTHROID) 112 MCG tablet Take 1 tablet (112 mcg total) by mouth every morning. 06/30/13   Mihai Croitoru, MD  magnesium oxide (MAG-OX) 400 (241.3 MG) MG tablet Take 1 tablet (400 mg total) by mouth daily. 07/11/13   Haywood Pao, MD  metolazone (ZAROXOLYN) 5 MG tablet Take 5 mg (1 tablet) once daily on Monday Wednesday and Friday only 12/19/15   Shirley Friar, PA-C  metoprolol succinate (TOPROL-XL) 50 MG 24 hr tablet Take 50 mg by mouth daily. Take with or immediately following a meal.    Historical Provider, MD  mometasone (NASONEX) 50 MCG/ACT nasal spray Place 1 spray into the nose daily as needed (congestion).    Historical Provider, MD    morphine (MS CONTIN) 30 MG 12 hr tablet Take 1 tablet (30 mg total) by mouth 2 (two) times daily. Patient taking differently: Take 30 mg by mouth See admin instructions. Take 1 tablet (30 mg) by mouth twice daily, may also take 1/2 tablet (15 mg) 3 times daily as needed for breakthrough pain 09/13/15   Allie Bossier, MD  morphine (MSIR) 15 MG tablet Take 1 tablet (15 mg total) by mouth 3 (three) times daily as needed (breakthrough pain). 09/13/15   Allie Bossier, MD  nitroGLYCERIN (NITROSTAT) 0.4 MG SL tablet Place 1 tablet (0.4 mg total) under the tongue every 5 (five) minutes as needed for chest pain. 12/02/15 03/01/16  Mihai Croitoru, MD  polyethylene glycol (MIRALAX / GLYCOLAX) packet Take 17 g by mouth 2 (two) times daily as needed (constipation). Mix in 8 oz liquid and drink    Historical Provider, MD  potassium chloride SA (KLOR-CON M20) 20 MEQ tablet Take 3 tablets (60 mEq total) by mouth 2 (two) times daily. Take an EXTRA 20 meq (1 tablet) on Monday Wednesday and Fridays with metolazone. 12/31/15 12/25/16  Shirley Friar, PA-C  Probiotic Product (PROBIOTIC PO) Take 1 tablet by mouth daily.    Historical Provider, MD  senna (SENOKOT) 8.6 MG TABS tablet Take 1 tablet by mouth 3 (three) times daily as needed for mild constipation.    Historical Provider, MD  sertraline (ZOLOFT) 100 MG tablet Take 100 mg by mouth daily.    Historical Provider, MD  torsemide (DEMADEX) 100 MG tablet Take 1 tablet (100 mg total) by mouth 2 (two) times daily. 11/21/15   Mihai Croitoru, MD  vitamin E 400 UNIT capsule Take 400 Units by mouth daily.    Historical Provider, MD    Physical Exam: Vitals:   01/05/16 1815 01/05/16 1830 01/05/16 1915 01/05/16 2015  BP: (!) 129/112 (!) 78/65 117/89 114/62  Pulse: (!) 57 90 68 64  Resp: 13 16 13 11   Temp:      TempSrc:      SpO2: 98% 96% 98% 95%  Weight:      Height:       General: Not in acute distress HEENT:       Eyes: PERRL, EOMI, no scleral icterus.        ENT: No discharge from  the ears and nose, no pharynx injection, no tonsillar enlargement.        Neck: positive JVD, no bruit, no mass felt. Heme: No neck lymph node enlargement. Cardiac: S1/S2, RRR, No murmurs, No gallops or rubs. Respiratory: No rales, wheezing, rhonchi or rubs. GI: Soft, nondistended, nontender, no rebound pain, no organomegaly, BS present. GU: No hematuria Ext: 3+ pitting leg edema bilaterally. Has bilateral leg lymphedema and erythema. 2+DP/PT pulse bilaterally. Musculoskeletal: No joint deformities, No joint redness or warmth, no limitation of ROM in spin. Skin: No rashes.  Neuro: Alert, oriented X3, cranial nerves II-XII grossly intact, moves all extremities normally.  Psych: Patient is not psychotic, no suicidal or hemocidal ideation.  Labs on Admission: I have personally reviewed following labs and imaging studies  CBC:  Recent Labs Lab 01/02/16 1221 01/05/16 1747  WBC  --  4.7  NEUTROABS  --  3.0  HGB 11.6* 10.7*  HCT  --  33.2*  MCV  --  101.2*  PLT  --  [id number]*   Basic Metabolic Panel:  Recent Labs Lab 01/05/16 1747  NA 136  K 4.1  CL 96*  CO2 27  GLUCOSE 103*  BUN 95*  CREATININE 1.73*  CALCIUM 9.8   GFR: Estimated Creatinine Clearance: 24.4 mL/min (by C-G formula based on SCr of 1.73 mg/dL (H)). Liver Function Tests: No results for input(s): AST, ALT, ALKPHOS, BILITOT, PROT, ALBUMIN in the last 168 hours. No results for input(s): LIPASE, AMYLASE in the last 168 hours. No results for input(s): AMMONIA in the last 168 hours. Coagulation Profile: No results for input(s): INR, PROTIME in the last 168 hours. Cardiac Enzymes:  Recent Labs Lab 01/05/16 1747  TROPONINI <0.03   BNP (last 3 results) No results for input(s): PROBNP in the last 8760 hours. HbA1C: No results for input(s): HGBA1C in the last 72 hours. CBG: No results for input(s): GLUCAP in the last 168 hours. Lipid Profile: No results for input(s): CHOL, HDL, LDLCALC,  TRIG, CHOLHDL, LDLDIRECT in the last 72 hours. Thyroid Function Tests: No results for input(s): TSH, T4TOTAL, FREET4, T3FREE, THYROIDAB in the last 72 hours. Anemia Panel: No results for input(s): VITAMINB12, FOLATE, FERRITIN, TIBC, IRON, RETICCTPCT in the last 72 hours. Urine analysis:    Component Value Date/Time   COLORURINE YELLOW 11/15/2015 1251   APPEARANCEUR CLEAR 11/15/2015 1251   LABSPEC 1.007 11/15/2015 1251   PHURINE 6.5 11/15/2015 1251   GLUCOSEU NEGATIVE 11/15/2015 1251   HGBUR NEGATIVE 11/15/2015 1251   BILIRUBINUR NEGATIVE 11/15/2015 1251   KETONESUR NEGATIVE 11/15/2015 1251   PROTEINUR NEGATIVE 11/15/2015 1251   UROBILINOGEN 0.2 11/18/2014 0600   NITRITE NEGATIVE 11/15/2015 1251   LEUKOCYTESUR NEGATIVE 11/15/2015 1251   Sepsis Labs: @LABRCNTIP (procalcitonin:4,lacticidven:4) )No results found for this or any previous visit (from the past 240 hour(s)).   Radiological Exams on Admission: Dg Chest 2 View  Result Date: 01/05/2016 CLINICAL DATA:  Shortness of breath. EXAM: CHEST  2 VIEW COMPARISON:  11/06/2015 and prior exams FINDINGS: Cardiomegaly with pulmonary vascular congestion noted. Probable left lower lung atelectasis noted. No pleural effusion or pneumothorax. No acute bony abnormality identified. IMPRESSION: Cardiomegaly with pulmonary vascular congestion. Probable left lower lung atelectasis. Electronically Signed   By: Margarette Canada M.D.   On: 01/05/2016 17:44     EKG: Independently reviewed. Atrial fibrillation, QTC 491, T-wave flattening.   Assessment/Plan Principal Problem:   Acute on chronic diastolic CHF (congestive heart failure) (HCC) Active Problems:   HLD (hyperlipidemia)   Depression  Essential hypertension   COPD (chronic obstructive pulmonary disease) (HCC)   Constipation   Frequent falls   CKD (chronic kidney disease), stage III   Chronic cor pulmonale (HCC)   Atrial fibrillation, chronic (HCC)   Lymphedema   Acute CHF (congestive  heart failure) (HCC)   Acute on chronic diastolic CHF (congestive heart failure) [facility]): Patient has worsening shortness of breath, elevated BNP, positive JVD, worsening leg edema and vascular congestion on chest x-ray, consistent with CHF exacerbation. Patient does not have oxygen desaturation. 2-D echo on 12/09/15 showed EF C5-70%  -will place on tele bed for obs -Lasix 80 mg bid by IV -trop x 3 -will not get 2d echo since she had 2d ehco on 12/09/15 and CHF is only mildly exacerbated -will continue home metoprolol, ASA -Daily weights -strict I/O's -Low salt diet  COPD: stable. -contnue Xopenex Nebulizer.  HLD: Last LDL was 23 on 09/10/15 -Continue home medications: Lipitor  HTN: -continue metoprolol -On IV Lasix  Atrial Fibrillation: CHA2DS2-VASc Score is 5, needs oral anticoagulation, but pt is not on AC possibly due to high-risk of fall. Heart rate is well controlled. -continue Aspirin and metoprolol  CKD-III: stable. Baseline 21.6-1.9. Her creatinine is 1.73. -Follow-up renal function by BMP  Depression and anxiety: Stable, no suicidal or homicidal ideations. -Continue home medications: Zoloft  Constipation: -MiraLAX, Colace, senokot  Hypothyroidism: Last TSH was 7.936 on 11/15/15 -Continue home Synthroid -Check TSH   DVT ppx: SQ Lovenox Code Status: Full code Family Communication: None at bed side. Disposition Plan:  Anticipate discharge back to previous home environment Consults called:  none Admission status: Obs / tele    Date of Service 01/05/2016    Magnolia Mattila, [place] [facility] Pager [phone]  If 7PM-7AM, please contact night-coverage [email or web address] Password [email or web address] 01/05/2016, 10:13 PM

## 2016-01-05 NOTE — ED Notes (Signed)
Myself and Josh, EMT placed patient on bedpan to use; cleaned patient and placed a chuk underneath; repositioned patient and patient is resting at this time

## 2016-01-06 DIAGNOSIS — I2729 Other secondary pulmonary hypertension: Secondary | ICD-10-CM | POA: Diagnosis present

## 2016-01-06 DIAGNOSIS — D631 Anemia in chronic kidney disease: Secondary | ICD-10-CM | POA: Diagnosis present

## 2016-01-06 DIAGNOSIS — I10 Essential (primary) hypertension: Secondary | ICD-10-CM | POA: Diagnosis not present

## 2016-01-06 DIAGNOSIS — R001 Bradycardia, unspecified: Secondary | ICD-10-CM | POA: Diagnosis present

## 2016-01-06 DIAGNOSIS — E875 Hyperkalemia: Secondary | ICD-10-CM | POA: Diagnosis present

## 2016-01-06 DIAGNOSIS — I130 Hypertensive heart and chronic kidney disease with heart failure and stage 1 through stage 4 chronic kidney disease, or unspecified chronic kidney disease: Secondary | ICD-10-CM | POA: Diagnosis present

## 2016-01-06 DIAGNOSIS — I5021 Acute systolic (congestive) heart failure: Secondary | ICD-10-CM | POA: Diagnosis not present

## 2016-01-06 DIAGNOSIS — E785 Hyperlipidemia, unspecified: Secondary | ICD-10-CM | POA: Diagnosis present

## 2016-01-06 DIAGNOSIS — G894 Chronic pain syndrome: Secondary | ICD-10-CM | POA: Diagnosis present

## 2016-01-06 DIAGNOSIS — I482 Chronic atrial fibrillation: Secondary | ICD-10-CM | POA: Diagnosis not present

## 2016-01-06 DIAGNOSIS — Z9119 Patient's noncompliance with other medical treatment and regimen: Secondary | ICD-10-CM | POA: Diagnosis not present

## 2016-01-06 DIAGNOSIS — G4733 Obstructive sleep apnea (adult) (pediatric): Secondary | ICD-10-CM | POA: Diagnosis present

## 2016-01-06 DIAGNOSIS — I2781 Cor pulmonale (chronic): Secondary | ICD-10-CM | POA: Diagnosis not present

## 2016-01-06 DIAGNOSIS — Z79899 Other long term (current) drug therapy: Secondary | ICD-10-CM | POA: Diagnosis not present

## 2016-01-06 DIAGNOSIS — R600 Localized edema: Secondary | ICD-10-CM | POA: Diagnosis not present

## 2016-01-06 DIAGNOSIS — I5033 Acute on chronic diastolic (congestive) heart failure: Secondary | ICD-10-CM | POA: Diagnosis not present

## 2016-01-06 DIAGNOSIS — E039 Hypothyroidism, unspecified: Secondary | ICD-10-CM | POA: Diagnosis present

## 2016-01-06 DIAGNOSIS — F419 Anxiety disorder, unspecified: Secondary | ICD-10-CM | POA: Diagnosis present

## 2016-01-06 DIAGNOSIS — K5900 Constipation, unspecified: Secondary | ICD-10-CM | POA: Diagnosis present

## 2016-01-06 DIAGNOSIS — Z79891 Long term (current) use of opiate analgesic: Secondary | ICD-10-CM | POA: Diagnosis not present

## 2016-01-06 DIAGNOSIS — R296 Repeated falls: Secondary | ICD-10-CM | POA: Diagnosis present

## 2016-01-06 DIAGNOSIS — J449 Chronic obstructive pulmonary disease, unspecified: Secondary | ICD-10-CM | POA: Diagnosis present

## 2016-01-06 DIAGNOSIS — Q820 Hereditary lymphedema: Secondary | ICD-10-CM | POA: Diagnosis not present

## 2016-01-06 DIAGNOSIS — I2510 Atherosclerotic heart disease of native coronary artery without angina pectoris: Secondary | ICD-10-CM | POA: Diagnosis present

## 2016-01-06 DIAGNOSIS — Z7982 Long term (current) use of aspirin: Secondary | ICD-10-CM | POA: Diagnosis not present

## 2016-01-06 DIAGNOSIS — I890 Lymphedema, not elsewhere classified: Secondary | ICD-10-CM

## 2016-01-06 DIAGNOSIS — F329 Major depressive disorder, single episode, unspecified: Secondary | ICD-10-CM | POA: Diagnosis present

## 2016-01-06 DIAGNOSIS — N183 Chronic kidney disease, stage 3 (moderate): Secondary | ICD-10-CM | POA: Diagnosis not present

## 2016-01-06 DIAGNOSIS — I5031 Acute diastolic (congestive) heart failure: Secondary | ICD-10-CM | POA: Diagnosis not present

## 2016-01-06 LAB — BASIC METABOLIC PANEL
ANION GAP: 11 (ref 5–15)
BUN: 94 mg/dL — ABNORMAL HIGH (ref 6–20)
CO2: 28 mmol/L (ref 22–32)
Calcium: 9.8 mg/dL (ref 8.9–10.3)
Chloride: 97 mmol/L — ABNORMAL LOW (ref 101–111)
Creatinine, Ser: 1.71 mg/dL — ABNORMAL HIGH (ref 0.44–1.00)
GFR calc Af Amer: 32 mL/min — ABNORMAL LOW (ref >60)
GFR, EST NON AFRICAN AMERICAN: 27 mL/min — AB (ref >60)
GLUCOSE: 92 mg/dL (ref 65–99)
Potassium: 4.3 mmol/L (ref 3.5–5.1)
SODIUM: 136 mmol/L (ref 135–145)

## 2016-01-06 LAB — TROPONIN I: Troponin I: <0.03 ng/mL (ref <0.03)

## 2016-01-06 LAB — TSH
TSH: 28.134 u[IU]/mL — ABNORMAL HIGH (ref 0.350–4.500)
TSH: 20.459 u[IU]/mL — ABNORMAL HIGH (ref 0.350–4.500)

## 2016-01-06 LAB — T4, FREE: Free T4: 0.72 ng/dL (ref 0.61–1.12)

## 2016-01-06 MED ORDER — METOLAZONE 2.5 MG PO TABS
2.5000 mg | ORAL_TABLET | Freq: Every day | ORAL | Status: DC
  Administered 2016-01-06 – 2016-01-08 (×3): 2.5 mg via ORAL
  Filled 2016-01-06 (×3): qty 1

## 2016-01-06 NOTE — Telephone Encounter (Signed)
Thank you. She was admitted. I let Larena Glassman know today.

## 2016-01-06 NOTE — Care Management Note (Signed)
Case Management Note  Patient Details  Name: Victoria Fisher MRN: [id number] Date of Birth: [birth year]  Subjective/Objective:     Admitted with CHF             Action/Plan: Patient resides at [place]; PCP: Haywood Pao, MD; has private insurance with [employer]; patient is active with [facility] for [facility] services as prior to admission for HHRN/PT/ nurses aide; CM will continue to follow for DCP  Expected Discharge Date:  Possibly 01/09/2016            Expected Discharge Plan:  [place]  In-House Referral:    [facility] CHF Discharge planning Services  CM Consult  HH Arranged:  RN, PT, Nurse's Aide [facility] Agency:  [place]  Status of Service:  In process, will continue to follow  Sherrilyn Rist [id number] 01/06/2016, 11:02 AM

## 2016-01-06 NOTE — Evaluation (Signed)
Occupational Therapy Evaluation Patient Details Name: Victoria Fisher MRN: [id number] DOB: 1934/05/17 Today's Date: 01/06/2016    History of Present Illness 81 y.o. female with medical history significant of dCHF, lymphedema, atrial fibrillation not on anticoagulant, hypertension, hyperlipidemia, COPD, hypothyroidism, depression, sciatica, OSA not on CPAP, TB, IBS, remote colon cancer, anemia, CKD-III, who presents with shortness present worsening leg edema   Clinical Impression   Pt with decline in function with ADLs and ADLs with decreased balance and activity tolerance. Pt would benefit from acute OT services to address impairments to increase level of function and safety    Follow Up Recommendations  No OT follow up;Supervision - Intermittent    Equipment Recommendations  None recommended by OT    Recommendations for Other Services       Precautions / Restrictions Precautions Precautions: Fall Precaution Comments: watch O2 saturations Restrictions Weight Bearing Restrictions: No      Mobility Bed Mobility Overal bed mobility: Modified Independent             General bed mobility comments: use of rail and increased time to perform  Transfers Overall transfer level: Needs assistance Equipment used: Rolling walker (2 wheeled) Transfers: Sit to/from Stand Sit to Stand: Min guard         General transfer comment: min guard for safety, no physical assist required    Balance Overall balance assessment: History of Falls                                          ADL Overall ADL's : Needs assistance/impaired     Grooming: Wash/dry hands;Wash/dry face;Standing;Min guard   Upper Body Bathing: Set up;Sitting   Lower Body Bathing: Sitting/lateral leans;Sit to/from stand;Minimal assistance   Upper Body Dressing : Set up;Sitting   Lower Body Dressing: Minimal assistance;Sit to/from stand;Sitting/lateral leans   Toilet Transfer: Min  guard;RW;Ambulation;Comfort height toilet   Toileting- Clothing Manipulation and Hygiene: Min guard   Tub/ Shower Transfer: Min guard;Shower seat;Grab bars;Rolling walker   Functional mobility during ADLs: Rolling walker;Min guard       Vision  glasses, no change from baseline              Pertinent Vitals/Pain Pain Assessment: Faces Faces Pain Scale: Hurts even more Pain Location: B feet Pain Descriptors / Indicators: Heaviness Pain Intervention(s): Monitored during session     Hand Dominance Right   Extremity/Trunk Assessment Upper Extremity Assessment Upper Extremity Assessment: Generalized weakness   Lower Extremity Assessment Lower Extremity Assessment: Defer to PT evaluation   Cervical / Trunk Assessment Cervical / Trunk Assessment:  (nech deformity)   Communication Communication Communication: No difficulties   Cognition Arousal/Alertness: Awake/alert Behavior During Therapy: WFL for tasks assessed/performed Overall Cognitive Status: No family/caregiver present to determine baseline cognitive functioning                                        Home Living Family/patient expects to be discharged to:: Private residence Living Arrangements: Spouse/significant other;Other (Comment) (assistance 4x/wk) Available Help at Discharge: Personal care attendant;Available PRN/intermittently Type of Home: Independent living facility Home Access: Level entry     Home Layout: One level     Bathroom Shower/Tub: Occupational psychologist: Standard Bathroom Accessibility: Yes   Home Equipment: Environmental consultant - 2  wheels;Cane - single point;Grab bars - toilet;Grab bars - tub/shower;Shower seat          Prior Functioning/Environment Level of Independence: Needs assistance  Gait / Transfers Assistance Needed: uses RW at home but reports "more falls than she can count" ADL's / Homemaking Assistance Needed: pt reports she is independent with BADLs.  Facility does cooking   Comments: patient states that she cares for husband who has dementia        OT Problem List: Decreased activity tolerance;Pain;Impaired balance (sitting and/or standing);Decreased strength;Decreased knowledge of use of DME or AE;Decreased safety awareness   OT Treatment/Interventions: Self-care/ADL training;DME and/or AE instruction;Therapeutic activities;Patient/family education    OT Goals(Current goals can be found in the care plan section) Acute Rehab OT Goals Patient Stated Goal: to go back home OT Goal Formulation: With patient Time For Goal Achievement: 01/13/16 Potential to Achieve Goals: Good ADL Goals Pt Will Perform Grooming: with supervision;with set-up;with modified independence;standing Pt Will Perform Lower Body Bathing: with min guard assist;with supervision;with set-up;sitting/lateral leans;sit to/from stand Pt Will Perform Lower Body Dressing: with min guard assist;with supervision;with set-up;sitting/lateral leans;sit to/from stand Pt Will Transfer to Toilet: with min guard assist;with supervision;with set-up;ambulating Pt Will Perform Toileting - Clothing Manipulation and hygiene: with supervision;with modified independence;sit to/from stand Pt Will Perform Tub/Shower Transfer: with supervision;with modified independence;shower seat;3 in 1;ambulating;grab bars  OT Frequency: Min 2X/week   Barriers to D/C:    no barriers                     End of Session Equipment Utilized During Treatment: Gait belt;Rolling walker;Other (comment) (3 in 1)  Activity Tolerance: Patient tolerated treatment well Patient left: Other (comment) (with PT ambulating)   Time: 1040-1100 OT Time Calculation (min): 20 min Charges:  OT General Charges $OT Visit: 1 Procedure OT Evaluation $OT Eval Moderate Complexity: 1 Procedure G-Codes: OT G-codes **NOT FOR INPATIENT CLASS** Functional Assessment Tool Used: clinical judgement Functional Limitation:  Self care Self Care Current Status [id number]): At least 1 percent but less than 20 percent impaired, limited or restricted Self Care Goal Status [id number]): At least 1 percent but less than 20 percent impaired, limited or restricted  Britt Bottom 01/06/2016, 12:58 PM

## 2016-01-06 NOTE — Evaluation (Signed)
Physical Therapy Evaluation Patient Details Name: Victoria Fisher MRN: [id number] DOB: [birth year] Today's Date: 01/06/2016   History of Present Illness  81 y.o. female with medical history significant of dCHF, lymphedema, atrial fibrillation not on anticoagulant, hypertension, hyperlipidemia, COPD, hypothyroidism, depression, sciatica, OSA not on CPAP, TB, IBS, remote colon cancer, anemia, CKD-III, who presents with shortness present worsening leg edema  Clinical Impression  Patient demonstrates deficits in functional mobility as indicated below. Patient will benefit from continued skilled PT to address deficits and maximize function. Will see as indicated and progress as tolerated.    Follow Up Recommendations Home health PT;Supervision for mobility/OOB    Equipment Recommendations  None recommended by PT    Recommendations for Other Services       Precautions / Restrictions Precautions Precautions: Fall Precaution Comments: watch O2 saturations Restrictions Weight Bearing Restrictions: No      Mobility  Bed Mobility Overal bed mobility: Modified Independent             General bed mobility comments: use of rail and increased time to perform  Transfers Overall transfer level: Needs assistance Equipment used: Rolling walker (2 wheeled) Transfers: Sit to/from Stand Sit to Stand: Min guard         General transfer comment: min guard for safety, no physical assist required  Ambulation/Gait Ambulation/Gait assistance: Min assist Ambulation Distance (Feet): 160 Feet Assistive device: Rolling walker (2 wheeled) Gait Pattern/deviations: Step-through pattern;Decreased stride length;Trunk flexed;Drifts right/left;Narrow base of support Gait velocity: decreased Gait velocity interpretation: Below normal speed for age/gender General Gait Details: min assist for stability  Stairs            Wheelchair Mobility    Modified Rankin (Stroke Patients Only)        Balance Overall balance assessment: History of Falls                                           Pertinent Vitals/Pain Pain Assessment: Faces Faces Pain Scale: Hurts even more Pain Location: bilateral LE (feet) Pain Descriptors / Indicators: Discomfort;Heaviness Pain Intervention(s): Monitored during session    Home Living Family/patient expects to be discharged to:: Private residence Living Arrangements: Spouse/significant other;Other (Comment) (Assistance 4 x weekly) Available Help at Discharge: Personal care attendant;Available PRN/intermittently (2x per week for a few hours) Type of Home: Independent living facility Home Access: Level entry     Home Layout: One level Home Equipment: Walker - 2 wheels;Cane - single point;Grab bars - toilet;Grab bars - tub/shower;Shower seat      Prior Function Level of Independence: Needs assistance   Gait / Transfers Assistance Needed: uses RW at home but reports "more falls than she can count"     Comments: patient states that she cares for husband who has dementia     Hand Dominance   Dominant Hand: Right    Extremity/Trunk Assessment   Upper Extremity Assessment: Generalized weakness           Lower Extremity Assessment: Generalized weakness      Cervical / Trunk Assessment:  (neck deformity noted at baseline)  Communication   Communication: No difficulties  Cognition Arousal/Alertness: Awake/alert Behavior During Therapy: WFL for tasks assessed/performed Overall Cognitive Status: No family/caregiver present to determine baseline cognitive functioning                      General Comments  Exercises     Assessment/Plan    PT Assessment Patient needs continued PT services  PT Problem List Decreased strength;Decreased activity tolerance;Decreased balance;Decreased mobility;Cardiopulmonary status limiting activity;Pain          PT Treatment Interventions DME instruction;Gait  training;Functional mobility training;Therapeutic activities;Therapeutic exercise;Balance training;Patient/family education    PT Goals (Current goals can be found in the Care Plan section)  Acute Rehab PT Goals Patient Stated Goal: to go back home PT Goal Formulation: With patient Time For Goal Achievement: 01/20/16 Potential to Achieve Goals: Good    Frequency Min 3X/week   Barriers to discharge        Co-evaluation PT/OT/SLP Co-Evaluation/Treatment:  (dove tailed session)             End of Session Equipment Utilized During Treatment: Gait belt Activity Tolerance: Patient tolerated treatment well Patient left: in chair;with call bell/phone within reach;with chair alarm set Nurse Communication: Mobility status    Functional Assessment Tool Used: clinical judgement Functional Limitation: Mobility: Walking and moving around Mobility: Walking and Moving Around Current Status [phone]): At least 1 percent but less than 20 percent impaired, limited or restricted Mobility: Walking and Moving Around Goal Status [phone]): At least 1 percent but less than 20 percent impaired, limited or restricted Mobility: Walking and Moving Around Discharge Status [phone]): At least 1 percent but less than 20 percent impaired, limited or restricted    Time: 1040-1106 PT Time Calculation (min) (ACUTE ONLY): 26 min   Charges:   PT Evaluation $PT Eval Moderate Complexity: 1 Procedure     PT G Codes:   PT G-Codes **NOT FOR INPATIENT CLASS** Functional Assessment Tool Used: clinical judgement Functional Limitation: Mobility: Walking and moving around Mobility: Walking and Moving Around Current Status [id number]): At least 1 percent but less than 20 percent impaired, limited or restricted Mobility: Walking and Moving Around Goal Status [phone]): At least 1 percent but less than 20 percent impaired, limited or restricted Mobility: Walking and Moving Around Discharge Status [phone]): At least 1 percent but  less than 20 percent impaired, limited or restricted    Duncan Dull 01/06/2016, [place], PT DPT  [phone]

## 2016-01-06 NOTE — Progress Notes (Signed)
[facility] PROGRESS NOTE    Progress Note  Victoria Fisher  [id number] DOB: April 08, 1934 DOA: 01/05/2016 PCP: Haywood Pao, MD     Brief Narrative:   Victoria Fisher is an 81 y.o. female with medical history significant of dCHF, lymphedema, atrial fibrillation not on anticoagulant, hypertension, hyperlipidemia, COPD, hypothyroidism, depression, sciatica, OSA not on CPAP, TB, IBS, remote colon cancer, anemia, CKD-III, who presents with shortness present worsening leg edema  Assessment/Plan:   Acute on chronic diastolic CHF (congestive heart failure) (HCC) Monitor strict I's and O's, with no events on telemetry she is in A. Fib. Continue IV Lasix and metolazone, cardiac biomarkers have been negative 2. 2-D echo was recently done. Continue metoprolol daily weights She has mild JVD but her lower extremity edema is chronic, she is is close to baseline as she could be for her weight. Continue additional 24 hours of IV diuresis.  COPD: Continue inhalers no wheezing on physical exam.  Hyperlipidemia: Continue Lipitor.  Essential hypertension: Continue metoprolol and IV Lasix.  Chronic atrial fibrillation: CHA2DS2-VASc Score is 5, needs oral anticoagulation, but pt is not on AC possibly due to high-risk of fall. Heart rate is well controlled.  Chronic NEC stage III: Chronic seems to be at baseline continue daily B meds.   Depression: Continue Zoloft.  Constipation: Continue MiraLAX, Colace and Senokot.  Hypothyroidism uncontrolled: Last TSH was 7.9 was in September 2017, today's 20 4 repeat TSH and free T4. She relates she is taking her Synthroid. It is unlikely she is being compliant with her Synthroid and she is on a pretty large dose, probably due to noncompliance    Essential hypertension Controlled.  Chronic Lymphedema Ted hose    DVT prophylaxis: lovenox Family Communication:none Disposition Plan/Barrier to D/C: home in am Code Status:       Code Status Orders        Start     Ordered   01/05/16 2156  Full code  Continuous     01/05/16 2156    Code Status History    Date Active Date Inactive Code Status Order ID Comments User Context   11/14/2015  8:38 PM 11/17/2015  2:19 PM Full Code [id number]  Karmen Bongo, MD Inpatient   09/09/2015  9:31 PM 09/13/2015  9:44 PM Full Code [id number]  Ivor Costa, MD ED   07/07/2013  6:19 AM 07/11/2013  2:32 PM Full Code [id number]  Haywood Pao, MD Inpatient   06/08/2013  6:52 AM 06/11/2013  7:18 PM Full Code [id number]  Berle Mull, MD Inpatient    Advance Directive Documentation   Flowsheet Row Most Recent Value  Type of Advance Directive  Living will  Pre-existing out of facility DNR order (yellow form or pink MOST form)  No data  "MOST" Form in Place?  No data        IV Access:    Peripheral IV   Procedures and diagnostic studies:   Dg Chest 2 View  Result Date: 01/05/2016 CLINICAL DATA:  Shortness of breath. EXAM: CHEST  2 VIEW COMPARISON:  11/06/2015 and prior exams FINDINGS: Cardiomegaly with pulmonary vascular congestion noted. Probable left lower lung atelectasis noted. No pleural effusion or pneumothorax. No acute bony abnormality identified. IMPRESSION: Cardiomegaly with pulmonary vascular congestion. Probable left lower lung atelectasis. Electronically Signed   By: Margarette Canada M.D.   On: 01/05/2016 17:44     Medical Consultants:    None.  Anti-Infectives:   None  Subjective:    Victoria Fisher  she relates her breathing is better.  Objective:    Vitals:   01/05/16 2230 01/05/16 2343 01/06/16 0437 01/06/16 0759  BP: 116/62 117/81 119/67 99/60  Pulse: 61 74 71 68  Resp: 11 15 15 18   Temp:  98.5 F (36.9 C) 97.8 F (36.6 C) 97.6 F (36.4 C)  TempSrc:  Oral Oral Oral  SpO2: 94% 96% 95% 98%  Weight:   72.8 kg (160 lb 8 oz)   Height:        Intake/Output Summary (Last 24 hours) at 01/06/16 1202 Last data filed at 01/06/16 1007  Gross per 24  hour  Intake              420 ml  Output              750 ml  Net             -330 ml   Filed Weights   01/05/16 1632 01/06/16 0437  Weight: 71.7 kg (158 lb) 72.8 kg (160 lb 8 oz)    Exam: General exam: In no acute distress. Respiratory system: Good air movement and clear to auscultation. Cardiovascular system: S1 & S2 heard, RRR. +JVD. Gastrointestinal system: Abdomen is nondistended, soft and nontender.  Extremities: No pedal edema. Skin: No rashes, lesions or ulcers Psychiatry: Judgement and insight appear normal. Mood & affect appropriate.    Data Reviewed:    Labs: Basic Metabolic Panel:  Recent Labs Lab 01/05/16 1747 01/06/16 0424  NA 136 136  K 4.1 4.3  CL 96* 97*  CO2 27 28  GLUCOSE 103* 92  BUN 95* 94*  CREATININE 1.73* 1.71*  CALCIUM 9.8 9.8   GFR Estimated Creatinine Clearance: 24.9 mL/min (by C-G formula based on SCr of 1.71 mg/dL (H)). Liver Function Tests: No results for input(s): AST, ALT, ALKPHOS, BILITOT, PROT, ALBUMIN in the last 168 hours. No results for input(s): LIPASE, AMYLASE in the last 168 hours. No results for input(s): AMMONIA in the last 168 hours. Coagulation profile No results for input(s): INR, PROTIME in the last 168 hours.  CBC:  Recent Labs Lab 01/02/16 1221 01/05/16 1747  WBC  --  4.7  NEUTROABS  --  3.0  HGB 11.6* 10.7*  HCT  --  33.2*  MCV  --  101.2*  PLT  --  102*   Cardiac Enzymes:  Recent Labs Lab 01/05/16 1747 01/05/16 2155 01/06/16 0424 01/06/16 0956  TROPONINI <0.03 <0.03 <0.03 <0.03   BNP (last 3 results) No results for input(s): PROBNP in the last 8760 hours. CBG: No results for input(s): GLUCAP in the last 168 hours. D-Dimer: No results for input(s): DDIMER in the last 72 hours. Hgb A1c: No results for input(s): HGBA1C in the last 72 hours. Lipid Profile: No results for input(s): CHOL, HDL, LDLCALC, TRIG, CHOLHDL, LDLDIRECT in the last 72 hours. Thyroid function studies:  Recent Labs   01/06/16 0424  TSH 28.134*   Anemia work up: No results for input(s): VITAMINB12, FOLATE, FERRITIN, TIBC, IRON, RETICCTPCT in the last 72 hours. Sepsis Labs:  Recent Labs Lab 01/05/16 1747  WBC 4.7   Microbiology No results found for this or any previous visit (from the past 240 hour(s)).   Medications:   . acidophilus  1 capsule Oral Daily  . aspirin EC  81 mg Oral Daily  . atorvastatin  40 mg Oral QPC supper  . calcitRIOL  0.25 mcg Oral Daily  . cholecalciferol  2,000 Units Oral Daily  .  docusate sodium  100 mg Oral BID  . enoxaparin (LOVENOX) injection  30 mg Subcutaneous Q24H  . ferrous sulfate  325 mg Oral QPC supper  . fluticasone  1 spray Each Nare Daily  . furosemide  80 mg Intravenous BID  . levothyroxine  112 mcg Oral QAC breakfast  . magnesium oxide  400 mg Oral Daily  . metoprolol succinate  50 mg Oral Daily  . morphine  30 mg Oral BID  . polyethylene glycol  17 g Oral BID  . sertraline  100 mg Oral Daily  . sodium chloride flush  3 mL Intravenous Q12H  . vitamin E  400 Units Oral Daily   Continuous Infusions:  Time spent: 25 min   LOS: 0 days   Charlynne Cousins  [facility] Pager [phone]  *Please refer to [email or web address], password [email or web address] to get updated schedule on who will round on this patient, as hospitalists switch teams weekly. If 7PM-7AM, please contact night-coverage at [email or web address], password [email or web address] for any overnight needs.  01/06/2016, 12:02 PM

## 2016-01-07 ENCOUNTER — Telehealth (HOSPITAL_COMMUNITY): Payer: Self-pay | Admitting: *Deleted

## 2016-01-07 DIAGNOSIS — I5021 Acute systolic (congestive) heart failure: Secondary | ICD-10-CM

## 2016-01-07 DIAGNOSIS — N183 Chronic kidney disease, stage 3 (moderate): Secondary | ICD-10-CM

## 2016-01-07 LAB — BASIC METABOLIC PANEL
Anion gap: 13 (ref 5–15)
BUN: 92 mg/dL — ABNORMAL HIGH (ref 6–20)
CALCIUM: 10 mg/dL (ref 8.9–10.3)
CO2: 31 mmol/L (ref 22–32)
CREATININE: 1.57 mg/dL — AB (ref 0.44–1.00)
Chloride: 94 mmol/L — ABNORMAL LOW (ref 101–111)
GFR, EST AFRICAN AMERICAN: 35 mL/min — AB (ref >60)
GFR, EST NON AFRICAN AMERICAN: 30 mL/min — AB (ref >60)
Glucose, Bld: 81 mg/dL (ref 65–99)
Potassium: 2.8 mmol/L — ABNORMAL LOW (ref 3.5–5.1)
SODIUM: 138 mmol/L (ref 135–145)

## 2016-01-07 MED ORDER — POTASSIUM CHLORIDE CRYS ER 20 MEQ PO TBCR: 40.0000 meq | EXTENDED_RELEASE_TABLET | Freq: Two times a day (BID) | ORAL | Status: DC

## 2016-01-07 MED ORDER — POTASSIUM CHLORIDE CRYS ER 20 MEQ PO TBCR
40.0000 meq | EXTENDED_RELEASE_TABLET | Freq: Two times a day (BID) | ORAL | Status: AC
  Administered 2016-01-07 (×2): 40 meq via ORAL
  Filled 2016-01-07 (×2): qty 2

## 2016-01-07 NOTE — Progress Notes (Signed)
Occupational Therapy Treatment Patient Details Name: Victoria Fisher MRN: [id number] DOB: 1934-11-01 Today's Date: 01/07/2016    History of present illness 81 y.o. female with medical history significant of dCHF, lymphedema, atrial fibrillation not on anticoagulant, hypertension, hyperlipidemia, COPD, hypothyroidism, depression, sciatica, OSA not on CPAP, TB, IBS, remote colon cancer, anemia, CKD-III, who presents with shortness present worsening leg edema   OT comments  Pt making progress with functional goals. OT will continue to follow  Follow Up Recommendations  No OT follow up;Supervision - Intermittent    Equipment Recommendations  None recommended by OT    Recommendations for Other Services      Precautions / Restrictions Precautions Precautions: Fall Precaution Comments: watch O2 saturations Restrictions Weight Bearing Restrictions: No       Mobility Bed Mobility               General bed mobility comments: pt up in recliner  Transfers Overall transfer level: Needs assistance Equipment used: Rolling walker (2 wheeled)   Sit to Stand: Min guard              Balance Overall balance assessment: History of Falls                                 ADL       Grooming: Wash/dry hands;Wash/dry face;Standing;Min guard;Oral care       Lower Body Bathing: Sitting/lateral leans;Sit to/from stand;Minimal assistance;Min guard       Lower Body Dressing: Minimal assistance;Sit to/from stand;Sitting/lateral leans;Min guard   Toilet Transfer: RW;Ambulation;Comfort height toilet;Supervision/safety;Grab bars   Toileting- Clothing Manipulation and Hygiene: Supervision/safety;Sit to/from stand   Tub/ Shower Transfer: Min guard;Grab bars;Rolling walker;3 in 1   Functional mobility during ADLs: Rolling walker;Min guard        Vision  glasses, no change from baseline                              Cognition   Behavior During  Therapy: [facility] for tasks assessed/performed Overall Cognitive Status: No family/caregiver present to determine baseline cognitive functioning                       Extremity/Trunk Assessment   generalized weakness. B LE edema                        General Comments  pt very pleasant and cooperative    Pertinent Vitals/ Pain       Pain Assessment: 0-10 Faces Pain Scale: Hurts little more Pain Location: feet Pain Descriptors / Indicators: Heaviness Pain Intervention(s): Monitored during session;Repositioned                                                          Frequency  Min 2X/week        Progress Toward Goals  OT Goals(current goals can now be found in the care plan section)  Progress towards OT goals: Progressing toward goals  Acute Rehab OT Goals Patient Stated Goal: to go back home  Plan Discharge plan remains appropriate  End of Session Equipment Utilized During Treatment: Gait belt;Rolling walker;Other (comment) (3 in 1)   Activity Tolerance Patient tolerated treatment well   Patient Left in chair;with call bell/phone within reach;with chair alarm set             Time: [id number] OT Time Calculation (min): 18 min  Charges: OT General Charges $OT Visit: 1 Procedure OT Treatments $Self Care/Home Management : 8-22 mins  Britt Bottom 01/07/2016, 1:22 PM

## 2016-01-07 NOTE — Progress Notes (Signed)
Heart Failure Navigator Consult Note  Presentation: per H &P Victoria Fisher is a 81 y.o. female with medical history significant of dCHF, lymphedema, atrial fibrillation not on anticoagulant, hypertension, hyperlipidemia, COPD, hypothyroidism, depression, sciatica, OSA not on CPAP, TB, IBS, remote colon cancer, anemia, CKD-III, who presents with shortness present worsening leg edema.  Patient states that her leg edema has been worsening in the past several days. she also has worsening SOB. She can speak in full sentences. Denies cough, chest pain. No fever or chills. She states that her abdomen is swelling up, causing her abdominal tightness, but no abdominal pain, nausea vomiting. She is constipated. Denies symptoms of UTI. No unilateral weakness.     Past Medical History:  Diagnosis Date  . Abnormality of gait 01/30/2015  . Anemia in CKD (chronic kidney disease)   . Anxiety   . Arthritis   . Asthma    On nebulizer  . Atrial fibrillation, chronic ([place])    a. Rate controlled w/ BB. No OAC 2/2 falls and gait instability.  . Bowel obstruction    Caused by scar tissue  . Chronic cor pulmonale (HCC)    Most likely due to COPD (cannot rule out contribution of diastolic LV dysfunction). ECHO 07/30/11 pulmonary artery pressure was estimated to be 50-60% mmHg & there was moderate tricuspid insufficiency.  . Chronic diastolic CHF (congestive heart failure) ([place])    a. Predominantly right heart failure due to cor pulmonale & in turn this is most likely due to untreated OSA;  b. 05/2013 Echo: EF 55-60%, no rwma, mild to mod MR, sev dil LA, mod to sev dil RV w/ reduced systolic fxn, massively dil RA, mod-sev TR, PASP 41mmHg.  [name] Chronic pain syndrome   . Colon cancer ([place])    Carcinoma of sigmoid colon 1984 & 1985.  [name] COPD (chronic obstructive pulmonary disease) ([place])   . Coronary artery disease    a. occluded proximal diagonal artery on angiography 2004  . Depression   . Diverticulosis of colon    . 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 Dyspnea on exertion    Reluctant to take diuretics  . Edema of both legs    Venous Doppler 05/11/12 was normal & no evidence of thrombus or thrombophlebitis. Chronic LE edema related to both cor pulmonale & lymphedema, hyperlipidemia & HTN involving coronary atherosclerosis by cardiac cath in 2004. Cath showed a tiny old occlusion of the optional diagonal branch & also proximal LAD.  [name] Frequent falls    Possibly due to orthostatic hypotension but we have not seen that on our visits. Recent fall 05/2012 with scalp hematoma, swollen left LE, & black eyes.  . Hyperlipidemia    On a statin. Catheterization 02/12/03 Showed a tiny old occlusion of the optional diagonal branch & also proximal LAD.  [name] Hypertension   . Hypothyroidism 2002   On hormone replacement therapy  . IBS (irritable bowel syndrome)   . Insomnia   . Kidney failure 09/05/2015   Pt states that she went to the doctor on thursday and was told she is in stage 4 kidney failure  . Lung nodule   . Milroy's disease    Lymphatic disorder diagnosed at [facility]  . Obstructive sleep apnea    Polysomnogram 09/25/11 AHI: 53.6/hr overall & 57.1/hr during REM sleep. Poor compliance previously with CPAP  . Pulmonary hypertension    Pullmonary hypertension, chronic cor pulmonale w/pulmonary artery pressures of 50&#x2011; 60 mmHg.  . Tuberculosis  Social History   Social History  . Marital status: Married    Spouse name: Georgiann Mccoy  . Number of children: 3  . Years of education: HS   Occupational History  .  Retired   Social History Main Topics  . Smoking status: Never Smoker  . Smokeless tobacco: Never Used  . Alcohol use No  . Drug use: No  . Sexual activity: No   Other Topics Concern  . None   Social History Narrative   Patient is married Passenger transport manager) and lives at home with her husband.   Patient is retired.   Patient has three children and her husband has two children.   Patient is  right-handed.   Patient drinks very little caffeine (1/2 cup daily)   Patient has a high school education.    ECHO:Study Conclusions--03/11/15 - Left ventricle: The cavity size was normal. Systolic function was   vigorous. The estimated ejection fraction was in the range of 65%   to 70%. Wall motion was normal; there were no regional wall   motion abnormalities. - Ventricular septum: The contour showed diastolic flattening   consistent with volume overload. - Aortic valve: Transvalvular velocity was within the normal range.   There was no stenosis. There was no regurgitation. - Mitral valve: Transvalvular velocity was within the normal range.   There was no evidence for stenosis. There was mild regurgitation. - Left atrium: The atrium was severely dilated. - Right ventricle: The cavity size was severely dilated. Wall   thickness was normal. Systolic function was normal. - Right atrium: The atrium was massively dilated. - Atrial septum: No defect or patent foramen ovale was identified   by color flow Doppler. - Tricuspid valve: There was malcoaptation of the valve leaflets.   There was severe regurgitation. - Pulmonary arteries: Systolic pressure was within the normal   range. PA peak pressure: 34 mm Hg (S).  BNP    Component Value Date/Time   BNP 468.0 (H) 01/05/2016 1747    ProBNP    Component Value Date/Time   PROBNP 4,547.0 (H) 06/08/2013 0032     Education Assessment and Provision:  Detailed education and instructions provided on heart failure disease management including the following:  Signs and symptoms of Heart Failure When to call the physician Importance of daily weights Low sodium diet Fluid restriction Medication management Anticipated future follow-up appointments  Patient education given on each of the above topics.  Patient acknowledges understanding and acceptance of all instructions.  I spoke with [name] regarding her HF and current  hospitalization.  She and her husband live in [place] at [facility].  She does have scale and weighs each day.  We reviewed when to call the clinic with increase in weights and how those relate to the signs and symptoms of HF.  I reinforced a low sodium diet and high sodium foods to avoid.  She does admit to eating pickles at times.  She says that their provided food has no added salt --I reminded her that most foods have sodium and to become more aware of sodium contents in foods that she likes.  She denies any issues with getting or taking prescribed medications.  She will follow with the [facility] Clinic.  Education Materials:  "Living Better With Heart Failure" Booklet, Daily Weight Tracker Tool   High Risk Criteria for Readmission and/or Poor Patient Outcomes:   EF <30%- 65-70%  2 or more admissions in 6 months- 3/6 mo  Difficult social situation-  no  Demonstrates medication noncompliance- denies   Barriers of Care:  Knowledge and compliance  Discharge Planning:   Plans to return to home with husband (who has Alzheimers) and home care aid.

## 2016-01-07 NOTE — Telephone Encounter (Signed)
Cecille Rubin called stating she is preparing pt's pill box for next week and needed to clarify pt's KCL dose.  Advised pt currently in the hospital and unsure what dose will be. She states she is aware pt is in hospital but they still need to fix meds for next week for now and will readjust when pt is d/c'd if there are changes.  Advised per last OV and labs at our office pt was on KCL 60 BID with extra 20 M/W/F with metolazone.  She ask that order be faxed to her to be filled.  RX with directions faxed to Cecille Rubin at [phone], she will make further adjustments once pt is d/c'd

## 2016-01-07 NOTE — Consult Note (Signed)
   [facility] CM Inpatient Consult   01/07/2016  [name] [birth year] [id number]  Restart services:  Patient evaluated for community based chronic disease management services with [facility] as a benefit of patient's Medicare Insurance.  The patient is Victoria Fisher is a 81 y.o.femalewith medical history significant of dCHF, lymphedema, atrial fibrillation not on anticoagulant, hypertension, hyperlipidemia, COPD, hypothyroidism, depression, sciatica, OSA not on CPAP, TB, IBS, remote colon cancer, anemia, CKD-III, whopresents with shortness present worsening leg edema.  Spoke with patient at bedside to explain [facility] services. Consent form signed.  Patient will receive post hospital discharge call and will be evaluated for monthly home visits for assessments and disease process education.  Left contact information and [facility] literature at bedside. Made Inpatient Case Manager aware that [facility] following. Of note, [facility] Management services does not replace or interfere with any services that are arranged by inpatient case management or social work.  For additional questions or referrals please contact:     Natividad Brood, RN BSN [facility] Liaison  [phone] business mobile phone Toll free office [phone]

## 2016-01-07 NOTE — Progress Notes (Signed)
[facility] PROGRESS NOTE    Progress Note  Victoria Fisher  [id number] DOB: 1934-11-29 DOA: 01/05/2016 PCP: Haywood Pao, MD     Brief Narrative:   DESTONI Fisher is an 81 y.o. female with medical history significant of dCHF, lymphedema, atrial fibrillation not on anticoagulant, hypertension, hyperlipidemia, COPD, hypothyroidism, depression, sciatica, OSA not on CPAP, TB, IBS, remote colon cancer, anemia, CKD-III, who presents with shortness present worsening leg edema  Assessment/Plan:   Acute on chronic diastolic CHF (congestive heart failure) ([place]): Monitor strict I's and O's, with no events on telemetry. Continue IV Lasix and metolazone, good diuresis. Continue metoprolol, weight cont to improve. Continue additional 24 hours of IV diuresis. Cr cont to improve with diuresis.  COPD: Continue inhalers no wheezing on physical exam.  Hyperlipidemia: Continue Lipitor.  Essential hypertension: Continue metoprolol and IV Lasix.  Chronic atrial fibrillation: CHA2DS2-VASc Score is 5, needs oral anticoagulation, but pt is not on AC possibly due to high-risk of fall. Heart rate is well controlled.  Chronic kidney disease stage III: Chronic seems to be at baseline continue daily B meds.  Depression: Continue Zoloft.  Constipation: Continue MiraLAX, Colace and Senokot.  Hypothyroidism uncontrolled: Likely sick euthyroid follow up with PCP.    Essential hypertension Controlled.  Chronic Lymphedema Ted hose    DVT prophylaxis: lovenox Family Communication:none Disposition Plan/Barrier to D/C: home in am Code Status:     Code Status Orders        Start     Ordered   01/05/16 2156  Full code  Continuous     01/05/16 2156    Code Status History    Date Active Date Inactive Code Status Order ID Comments User Context   11/14/2015  8:38 PM 11/17/2015  2:19 PM Full Code [id number]  Karmen Bongo, MD Inpatient   09/09/2015  9:31 PM 09/13/2015  9:44  PM Full Code [id number]  Ivor Costa, MD ED   07/07/2013  6:19 AM 07/11/2013  2:32 PM Full Code [id number]  Haywood Pao, MD Inpatient   06/08/2013  6:52 AM 06/11/2013  7:18 PM Full Code [id number]  Berle Mull, MD Inpatient    Advance Directive Documentation   Flowsheet Row Most Recent Value  Type of Advance Directive  Living will  Pre-existing out of facility DNR order (yellow form or pink MOST form)  No data  "MOST" Form in Place?  No data        IV Access:    Peripheral IV   Procedures and diagnostic studies:   Dg Chest 2 View  Result Date: 01/05/2016 CLINICAL DATA:  Shortness of breath. EXAM: CHEST  2 VIEW COMPARISON:  11/06/2015 and prior exams FINDINGS: Cardiomegaly with pulmonary vascular congestion noted. Probable left lower lung atelectasis noted. No pleural effusion or pneumothorax. No acute bony abnormality identified. IMPRESSION: Cardiomegaly with pulmonary vascular congestion. Probable left lower lung atelectasis. Electronically Signed   By: Margarette Canada M.D.   On: 01/05/2016 17:44     Medical Consultants:    None.  Anti-Infectives:   None  Subjective:    Victoria Fisher  she relates her breathing is better.  Objective:    Vitals:   01/07/16 0719 01/07/16 0846 01/07/16 1018 01/07/16 1259  BP: (!) 101/54 (!) 99/54 116/65 (!) 104/58  Pulse: 60 60 65 64  Resp: 19 18 18 20   Temp: 97.5 F (36.4 C)   97.4 F (36.3 C)  TempSrc: Oral   Oral  SpO2: 100% 97% 100% 100%  Weight: 70.4 kg (155 lb 3.2 oz)     Height:        Intake/Output Summary (Last 24 hours) at 01/07/16 1304 Last data filed at 01/07/16 1145  Gross per 24 hour  Intake              600 ml  Output             2700 ml  Net            -2100 ml   Filed Weights   01/05/16 1632 01/06/16 0437 01/07/16 0719  Weight: 71.7 kg (158 lb) 72.8 kg (160 lb 8 oz) 70.4 kg (155 lb 3.2 oz)    Exam: General exam: In no acute distress. Respiratory system: Good air movement and clear to  auscultation. Cardiovascular system: S1 & S2 heard, RRR. +JVD. Gastrointestinal system: Abdomen is nondistended, soft and nontender.  Extremities: No pedal edema. Skin: No rashes, lesions or ulcers Psychiatry: Judgement and insight appear normal. Mood & affect appropriate.    Data Reviewed:    Labs: Basic Metabolic Panel:  Recent Labs Lab 01/05/16 1747 01/06/16 0424 01/07/16 0557  NA 136 136 138  K 4.1 4.3 2.8*  CL 96* 97* 94*  CO2 27 28 31   GLUCOSE 103* 92 81  BUN 95* 94* 92*  CREATININE 1.73* 1.71* 1.57*  CALCIUM 9.8 9.8 10.0   GFR Estimated Creatinine Clearance: 26.7 mL/min (by C-G formula based on SCr of 1.57 mg/dL (H)). Liver Function Tests: No results for input(s): AST, ALT, ALKPHOS, BILITOT, PROT, ALBUMIN in the last 168 hours. No results for input(s): LIPASE, AMYLASE in the last 168 hours. No results for input(s): AMMONIA in the last 168 hours. Coagulation profile No results for input(s): INR, PROTIME in the last 168 hours.  CBC:  Recent Labs Lab 01/02/16 1221 01/05/16 1747  WBC  --  4.7  NEUTROABS  --  3.0  HGB 11.6* 10.7*  HCT  --  33.2*  MCV  --  101.2*  PLT  --  102*   Cardiac Enzymes:  Recent Labs Lab 01/05/16 1747 01/05/16 2155 01/06/16 0424 01/06/16 0956  TROPONINI <0.03 <0.03 <0.03 <0.03   BNP (last 3 results) No results for input(s): PROBNP in the last 8760 hours. CBG: No results for input(s): GLUCAP in the last 168 hours. D-Dimer: No results for input(s): DDIMER in the last 72 hours. Hgb A1c: No results for input(s): HGBA1C in the last 72 hours. Lipid Profile: No results for input(s): CHOL, HDL, LDLCALC, TRIG, CHOLHDL, LDLDIRECT in the last 72 hours. Thyroid function studies:  Recent Labs  01/06/16 1211  TSH 20.459*   Anemia work up: No results for input(s): VITAMINB12, FOLATE, FERRITIN, TIBC, IRON, RETICCTPCT in the last 72 hours. Sepsis Labs:  Recent Labs Lab 01/05/16 1747  WBC 4.7   Microbiology No results  found for this or any previous visit (from the past 240 hour(s)).   Medications:   . acidophilus  1 capsule Oral Daily  . aspirin EC  81 mg Oral Daily  . atorvastatin  40 mg Oral QPC supper  . calcitRIOL  0.25 mcg Oral Daily  . cholecalciferol  2,000 Units Oral Daily  . docusate sodium  100 mg Oral BID  . enoxaparin (LOVENOX) injection  30 mg Subcutaneous Q24H  . ferrous sulfate  325 mg Oral QPC supper  . fluticasone  1 spray Each Nare Daily  . furosemide  80 mg Intravenous BID  . levothyroxine  112 mcg Oral QAC breakfast  .  magnesium oxide  400 mg Oral Daily  . metolazone  2.5 mg Oral Daily  . metoprolol succinate  50 mg Oral Daily  . morphine  30 mg Oral BID  . polyethylene glycol  17 g Oral BID  . potassium chloride  40 mEq Oral BID  . sertraline  100 mg Oral Daily  . sodium chloride flush  3 mL Intravenous Q12H  . vitamin E  400 Units Oral Daily   Continuous Infusions:  Time spent: 25 min   LOS: 1 day   Charlynne Cousins  [facility] Pager [phone]  *Please refer to [email or web address], password [email or web address] to get updated schedule on who will round on this patient, as hospitalists switch teams weekly. If 7PM-7AM, please contact night-coverage at [email or web address], password [email or web address] for any overnight needs.  01/07/2016, 1:04 PM

## 2016-01-08 ENCOUNTER — Inpatient Hospital Stay (HOSPITAL_COMMUNITY): Admission: RE | Admit: 2016-01-08 | Payer: Self-pay | Source: Ambulatory Visit

## 2016-01-08 DIAGNOSIS — R296 Repeated falls: Secondary | ICD-10-CM

## 2016-01-08 LAB — BASIC METABOLIC PANEL
Anion gap: 12 (ref 5–15)
BUN: 87 mg/dL — ABNORMAL HIGH (ref 6–20)
CALCIUM: 10.1 mg/dL (ref 8.9–10.3)
CO2: 33 mmol/L — AB (ref 22–32)
CREATININE: 1.72 mg/dL — AB (ref 0.44–1.00)
Chloride: 94 mmol/L — ABNORMAL LOW (ref 101–111)
GFR, EST AFRICAN AMERICAN: 31 mL/min — AB (ref >60)
GFR, EST NON AFRICAN AMERICAN: 27 mL/min — AB (ref >60)
Glucose, Bld: 95 mg/dL (ref 65–99)
Potassium: 2.8 mmol/L — ABNORMAL LOW (ref 3.5–5.1)
SODIUM: 139 mmol/L (ref 135–145)

## 2016-01-08 MED ORDER — POTASSIUM CHLORIDE CRYS ER 20 MEQ PO TBCR
40.0000 meq | EXTENDED_RELEASE_TABLET | Freq: Two times a day (BID) | ORAL | Status: AC
  Administered 2016-01-08 (×2): 40 meq via ORAL
  Filled 2016-01-08 (×2): qty 2

## 2016-01-08 NOTE — Progress Notes (Signed)
Patient is alert and oriented up walking with stand by assist with a walker. Up to the bath room. Stated that she was unable to eat dinner asked for soft foods. Plan to recheck BMP and poss discharge in the am.

## 2016-01-08 NOTE — Progress Notes (Signed)
[facility] PROGRESS NOTE    Progress Note  Victoria Fisher  [id number] DOB: 1934-12-01 DOA: 01/05/2016 PCP: Haywood Pao, MD     Brief Narrative:   Victoria Fisher is an 81 y.o. female with medical history significant of dCHF, lymphedema, atrial fibrillation not on anticoagulant, hypertension, hyperlipidemia, COPD, hypothyroidism, depression, sciatica, OSA not on CPAP, TB, IBS, remote colon cancer, anemia, CKD-III, who presents with shortness present worsening leg edema  Assessment/Plan:   Acute on chronic diastolic CHF (congestive heart failure) ([place]): Brisk diuresis. Creatinine is trending up will hold IV Lasix and metolazone. Continue metoprolol, weight cont to improve. Blood pressure stable, continue metoprolol. Recheck a basic metabolic panel in the morning.  COPD: Continue inhalers no wheezing on physical exam.  Hyperlipidemia: Continue Lipitor.  Essential hypertension: At goal continue metoprolol and resume diuretics in the morning.  Chronic atrial fibrillation: CHA2DS2-VASc Score is 5, needs oral anticoagulation, but pt is not on AC possibly due to high-risk of fall. Heart rate is well controlled.  Chronic kidney disease stage III: Chronic seems to be at baseline continue daily B meds.  Depression: Continue Zoloft.  Constipation: Continue MiraLAX, Colace and Senokot.  Hypothyroidism uncontrolled: Likely sick euthyroid follow up with PCP.    Essential hypertension Controlled.  Chronic Lymphedema Ted hose  Severe hypokalemia: Replete orally recheck in the morning.  DVT prophylaxis: lovenox Family Communication:none Disposition Plan/Barrier to D/C: home in 11.23.2017 Code Status:     Code Status Orders        Start     Ordered   01/05/16 2156  Full code  Continuous     01/05/16 2156    Code Status History    Date Active Date Inactive Code Status Order ID Comments User Context   11/14/2015  8:38 PM 11/17/2015  2:19 PM Full  Code [id number]  Karmen Bongo, MD Inpatient   09/09/2015  9:31 PM 09/13/2015  9:44 PM Full Code [id number]  Ivor Costa, MD ED   07/07/2013  6:19 AM 07/11/2013  2:32 PM Full Code [id number]  Haywood Pao, MD Inpatient   06/08/2013  6:52 AM 06/11/2013  7:18 PM Full Code [id number]  Berle Mull, MD Inpatient    Advance Directive Documentation   Flowsheet Row Most Recent Value  Type of Advance Directive  Living will  Pre-existing out of facility DNR order (yellow form or pink MOST form)  No data  "MOST" Form in Place?  No data        IV Access:    Peripheral IV   Procedures and diagnostic studies:   No results found.   Medical Consultants:    None.  Anti-Infectives:   None  Subjective:    Victoria Fisher  she relates She feels better.  Objective:    Vitals:   01/07/16 1603 01/07/16 1948 01/08/16 0100 01/08/16 0640  BP: 106/60 122/82 105/64 114/60  Pulse: 65 63 65 71  Resp: 18 18  18   Temp: 97.9 F (36.6 C) 97.7 F (36.5 C) 97.1 F (36.2 C) 98.5 F (36.9 C)  TempSrc: Oral Oral Oral Oral  SpO2: 99% 100% 95% 99%  Weight:    69 kg (152 lb 3.2 oz)  Height:        Intake/Output Summary (Last 24 hours) at 01/08/16 1130 Last data filed at 01/08/16 0956  Gross per 24 hour  Intake              723 ml  Output  2800 ml  Net            -2077 ml   Filed Weights   01/06/16 0437 01/07/16 0719 01/08/16 0640  Weight: 72.8 kg (160 lb 8 oz) 70.4 kg (155 lb 3.2 oz) 69 kg (152 lb 3.2 oz)    Exam: General exam: In no acute distress. Respiratory system: Good air movement and clear to auscultation. Cardiovascular system: S1 & S2 heard, RRR. +JVD. Gastrointestinal system: Abdomen is nondistended, soft and nontender.  Extremities: pedal edema Skin: No rashes, lesions or ulcers Psychiatry: Judgement and insight appear normal. Mood & affect appropriate.    Data Reviewed:    Labs: Basic Metabolic Panel:  Recent Labs Lab 01/05/16 1747 01/06/16 0424  01/07/16 0557 01/08/16 0428  NA 136 136 138 139  K 4.1 4.3 2.8* 2.8*  CL 96* 97* 94* 94*  CO2 27 28 31  33*  GLUCOSE 103* 92 81 95  BUN 95* 94* 92* 87*  CREATININE 1.73* 1.71* 1.57* 1.72*  CALCIUM 9.8 9.8 10.0 10.1   GFR Estimated Creatinine Clearance: 24.2 mL/min (by C-G formula based on SCr of 1.72 mg/dL (H)). Liver Function Tests: No results for input(s): AST, ALT, ALKPHOS, BILITOT, PROT, ALBUMIN in the last 168 hours. No results for input(s): LIPASE, AMYLASE in the last 168 hours. No results for input(s): AMMONIA in the last 168 hours. Coagulation profile No results for input(s): INR, PROTIME in the last 168 hours.  CBC:  Recent Labs Lab 01/02/16 1221 01/05/16 1747  WBC  --  4.7  NEUTROABS  --  3.0  HGB 11.6* 10.7*  HCT  --  33.2*  MCV  --  101.2*  PLT  --  102*   Cardiac Enzymes:  Recent Labs Lab 01/05/16 1747 01/05/16 2155 01/06/16 0424 01/06/16 0956  TROPONINI <0.03 <0.03 <0.03 <0.03   BNP (last 3 results) No results for input(s): PROBNP in the last 8760 hours. CBG: No results for input(s): GLUCAP in the last 168 hours. D-Dimer: No results for input(s): DDIMER in the last 72 hours. Hgb A1c: No results for input(s): HGBA1C in the last 72 hours. Lipid Profile: No results for input(s): CHOL, HDL, LDLCALC, TRIG, CHOLHDL, LDLDIRECT in the last 72 hours. Thyroid function studies:  Recent Labs  01/06/16 1211  TSH 20.459*   Anemia work up: No results for input(s): VITAMINB12, FOLATE, FERRITIN, TIBC, IRON, RETICCTPCT in the last 72 hours. Sepsis Labs:  Recent Labs Lab 01/05/16 1747  WBC 4.7   Microbiology No results found for this or any previous visit (from the past 240 hour(s)).   Medications:   . acidophilus  1 capsule Oral Daily  . aspirin EC  81 mg Oral Daily  . atorvastatin  40 mg Oral QPC supper  . calcitRIOL  0.25 mcg Oral Daily  . cholecalciferol  2,000 Units Oral Daily  . docusate sodium  100 mg Oral BID  . enoxaparin (LOVENOX)  injection  30 mg Subcutaneous Q24H  . ferrous sulfate  325 mg Oral QPC supper  . fluticasone  1 spray Each Nare Daily  . levothyroxine  112 mcg Oral QAC breakfast  . magnesium oxide  400 mg Oral Daily  . metoprolol succinate  50 mg Oral Daily  . morphine  30 mg Oral BID  . polyethylene glycol  17 g Oral BID  . potassium chloride  40 mEq Oral BID  . sertraline  100 mg Oral Daily  . sodium chloride flush  3 mL Intravenous Q12H  . vitamin E  400 Units Oral Daily   Continuous Infusions:  Time spent: 25 min   LOS: 2 days   Charlynne Cousins  [facility] Pager [phone]  *Please refer to [email or web address], password [email or web address] to get updated schedule on who will round on this patient, as hospitalists switch teams weekly. If 7PM-7AM, please contact night-coverage at [email or web address], password [email or web address] for any overnight needs.  01/08/2016, 11:30 AM

## 2016-01-08 NOTE — Progress Notes (Signed)
Occupational Therapy Treatment Patient Details Name: Victoria Fisher MRN: [id number] DOB: [birth year] Today's Date: 01/08/2016    History of present illness 81 y.o. female with medical history significant of dCHF, lymphedema, atrial fibrillation not on anticoagulant, hypertension, hyperlipidemia, COPD, hypothyroidism, depression, sciatica, OSA not on CPAP, TB, IBS, remote colon cancer, anemia, CKD-III, who presents with shortness present worsening leg edema   OT comments  Pt with low potassium this a.m., however ok to assist to bathroom and grooming at sink. Pt making progress with functional goals and would like to return home today, however uncertain of potassium levels will affect a d/c for today.  Follow Up Recommendations  No OT follow up;Supervision - Intermittent    Equipment Recommendations  None recommended by OT    Recommendations for Other Services      Precautions / Restrictions Precautions Precautions: Fall Precaution Comments: watch O2 saturations Restrictions Weight Bearing Restrictions: No       Mobility Bed Mobility               General bed mobility comments: pt up in recliner  Transfers Overall transfer level: Needs assistance Equipment used: Rolling walker (2 wheeled) Transfers: Sit to/from Stand Sit to Stand: Min guard;Supervision              Balance Overall balance assessment: History of Falls                                 ADL       Grooming: Wash/dry hands;Wash/dry face;Standing;Oral care;Set up;Supervision/safety           Upper Body Dressing : Min guard;Standing       Toilet Transfer: RW;Ambulation;Comfort height toilet;Supervision/safety;Grab bars   Toileting- Clothing Manipulation and Hygiene: Supervision/safety;Sit to/from stand       Functional mobility during ADLs: Rolling walker;Min guard;Supervision/safety                                        Cognition   Behavior During  Therapy: WFL for tasks assessed/performed Overall Cognitive Status: Within Functional Limits for tasks assessed                                                  General Comments  Pt very pleasant and cooperative    Pertinent Vitals/ Pain       Pain Assessment: No/denies pain                                                          Frequency  Min 2X/week        Progress Toward Goals  OT Goals(current goals can now be found in the care plan section)  Progress towards OT goals: Progressing toward goals  Acute Rehab OT Goals Patient Stated Goal: to go back home today  Plan Discharge plan remains appropriate                     End of Session Equipment Utilized During Treatment: Gait belt;Rolling walker;Other (comment) (  3 in 1)   Activity Tolerance Patient tolerated treatment well   Patient Left in chair;with call bell/phone within reach;with chair alarm set             Time: 1041-1108 OT Time Calculation (min): 27 min  Charges: OT General Charges $OT Visit: 1 Procedure OT Treatments $Self Care/Home Management : 8-22 mins $Therapeutic Activity: 8-22 mins  Britt Bottom 01/08/2016, 12:41 PM

## 2016-01-08 NOTE — Progress Notes (Addendum)
PT Cancellation Note  Patient Details Name: Victoria Fisher MRN: [id number] DOB: December 31, 1934   Cancelled Treatment:    Reason Eval/Treat Not Completed: Medical issues which prohibited therapy. Pt's potassium level has decreased to 2.8 this AM and yesterday. PT will continue to f/u with pt as appropriate.   [place] 01/08/2016, 12:33 PM Sherie Don, [place], DPT [phone]

## 2016-01-09 DIAGNOSIS — I5031 Acute diastolic (congestive) heart failure: Secondary | ICD-10-CM

## 2016-01-09 LAB — BASIC METABOLIC PANEL
Anion gap: 10 (ref 5–15)
BUN: 84 mg/dL — AB (ref 6–20)
CHLORIDE: 96 mmol/L — AB (ref 101–111)
CO2: 34 mmol/L — AB (ref 22–32)
CREATININE: 1.64 mg/dL — AB (ref 0.44–1.00)
Calcium: 10.2 mg/dL (ref 8.9–10.3)
GFR calc Af Amer: 33 mL/min — ABNORMAL LOW (ref >60)
GFR calc non Af Amer: 29 mL/min — ABNORMAL LOW (ref >60)
Glucose, Bld: 88 mg/dL (ref 65–99)
Potassium: 3.3 mmol/L — ABNORMAL LOW (ref 3.5–5.1)
Sodium: 140 mmol/L (ref 135–145)

## 2016-01-09 MED ORDER — POTASSIUM CHLORIDE CRYS ER 20 MEQ PO TBCR
40.0000 meq | EXTENDED_RELEASE_TABLET | Freq: Two times a day (BID) | ORAL | Status: AC
  Administered 2016-01-09 (×2): 40 meq via ORAL
  Filled 2016-01-09 (×2): qty 2

## 2016-01-09 NOTE — Care Management Note (Signed)
Case Management Note  Patient Details  Name: Victoria Fisher MRN: [id number] Date of Birth: Dec 20, 1934  Subjective/Objective:    Please see previous NCM notes.                Action/Plan: Discharge Planning: AVS reviewed: NCM spoke to pt and has friend that will stay with her today. Faxed resumption of care orders to [facility]. Pt has RW and 3n1 at home.    Expected Discharge Date:  01/09/16               Expected Discharge Plan:  [place]  In-House Referral:  NA  Discharge planning Services  CM Consult  Post Acute Care Choice:  Home Health, Resumption of Svcs/PTA Provider Choice offered to:  Patient  DME Arranged:  N/A DME Agency:  NA  HH Arranged:  RN, PT, Nurse's Aide HH Agency:  [facility]  Status of Service:  Completed, signed off  If discussed at [place] of Stay Meetings, dates discussed:    Additional Comments:  Erenest Rasher, RN 01/09/2016, 10:18 AM

## 2016-01-09 NOTE — Discharge Summary (Signed)
Physician Discharge Summary  Victoria Fisher [id number] DOB: 23-Jan-1935 DOA: 01/05/2016  PCP: Haywood Pao, MD  Admit date: 01/05/2016 Discharge date: 01/09/2016  Admitted From: home Disposition:  Home  Recommendations for Outpatient Follow-up:  1. Follow up with Cardiologist in 1-2 weeks 2. Please obtain BMP/CBC in one week 3. Home with home health and physical therapy. 4. Follow-up with PCP in 4 weeks recheck a TSH and free T4.   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 She relates that for the last several days she has been more short of breath, she denies any chest pain or palpitations. But feels like her abdomen is tight in her lower extremities are more swollen.  Discharge Diagnoses:  Acute on chronic diastolic CHF (congestive heart failure) (HCC) It was difficult as her lowest weight had been 72 kg. Her BNP was elevated to mild JVD With crackles in her lungs and chest x-ray compatible with such, and a mild elevated BNP. She was started on IV Lasix she diureses about 5 L her weight dropped accordingly. On the day of discharge her weight was 68.5 kg. She will go back on the current home dose, I question compliance.  Hyperkalemia: A [place] was repleted as needed.  COPD: Stable no changes were made.  Hyperlipidemia continue Lipitor.  Essential hypertension: No changes were made.  Chronic atrial fibrillation: CHA2DS2-VASc Scoreis 5, needs oral anticoagulation, but pt is not on AC  possibly due to high-risk of fall. Heart rate is well controlled.  Constipation: Continue MiraLAX.  Chronic kidney disease stage III: Chronic severe baseline.  Hypothyroidism: She relates she is taking her Synthroid her TSH was checked which was 20, free T4 was 0.72 question sick euthyroid. Only follow-up with her primary care doctor to reevaluate in 6 weeks. Chronic cor pulmonale [facility])   Discharge Instructions  Discharge Instructions    AMB Referral to [place] [facility]    Complete by:  As directed    Reason for consult:  Post hospital follow up, multiple ED and hospital visits in 6 months   Diagnoses of:  Heart Failure   Expected date of contact:  1-3 days (reserved for hospital discharges)   Please assign to community nurse for transition of care calls and assess for home visits. Patient lives at [facility] in [place] with her husband.  Hospitalizations. Has care givers as well.  Questions please call:   Natividad Brood, RN BSN [facility] Liaison  [phone] business mobile phone Toll free office [phone]   Diet - low sodium heart healthy    Complete by:  As directed    Increase activity slowly    Complete by:  As directed        Medication List    STOP taking these medications   doxycycline 100 MG capsule Commonly known as:  VIBRAMYCIN     TAKE these medications   aspirin EC 81 MG tablet Take 81 mg by mouth daily.   atorvastatin 40 MG tablet Commonly known as:  LIPITOR Take 40 mg by mouth daily after supper.   calcitRIOL 0.25 MCG capsule Commonly known as:  ROCALTROL Take 1 capsule (0.25 mcg total) by mouth daily.   Darbepoetin Alfa 100 MCG/0.5ML Sosy injection Commonly known as:  ARANESP Inject 0.5 mLs (100 mcg total) into the skin every Friday at 6 PM.   docusate sodium 100 MG capsule Commonly known as:  COLACE Take 100 mg by mouth 2 (two) times daily.   ferrous sulfate 325 (65 FE) MG tablet Take 325 mg  by mouth daily after supper.   levalbuterol 45 MCG/ACT inhaler Commonly known as:  XOPENEX HFA Inhale 1 puff into the lungs every 4 (four) hours as needed for wheezing or shortness of breath.   levothyroxine 112 MCG tablet Commonly known as:  SYNTHROID, LEVOTHROID Take 1 tablet (112 mcg total) by mouth every morning.   magnesium oxide 400 (241.3 Mg) MG tablet Commonly known as:  MAG-OX Take 1 tablet (400 mg total) by mouth daily.   metolazone 5 MG tablet Commonly known as:  ZAROXOLYN Take 5 mg (1 tablet) once daily on Monday Wednesday and Friday only   metoprolol succinate 50 MG 24 hr tablet Commonly known as:  TOPROL-XL Take 50 mg by mouth daily. Take with or immediately following a meal.   mometasone 50 MCG/ACT nasal spray Commonly known as:  NASONEX Place 1 spray into the nose daily as needed (congestion).   morphine 30 MG 12 hr tablet Commonly known as:  MS CONTIN Take 1 tablet (30 mg total) by mouth 2 (two) times daily. What changed:  how much to take  when to take this  additional instructions   morphine 15 MG tablet Commonly known as:  MSIR Take 1 tablet (15 mg total) by mouth 3 (three) times daily as needed (breakthrough pain). What changed:  Another medication with the same name was changed. Make sure you understand how and when to take each.   nitroGLYCERIN 0.4 MG SL tablet Commonly known as:  NITROSTAT Place 1 tablet (0.4 mg total) under the tongue every 5 (five) minutes as needed for chest pain.   polyethylene glycol packet Commonly known as:  MIRALAX / GLYCOLAX Take 17 g by mouth 2 (two) times daily as needed (constipation). Mix in 8 oz liquid and drink   potassium chloride SA 20 MEQ tablet Commonly known as:  KLOR-CON M20 Take 3 tablets (60 mEq total) by mouth 2 (two) times daily. Take an EXTRA 20 meq (1 tablet) on Monday Wednesday and Fridays with metolazone. What changed:  how much to take  additional instructions   PROBIOTIC PO Take 1  tablet by mouth daily.   senna 8.6 MG Tabs tablet Commonly known as:  SENOKOT Take 2-3 tablets by mouth daily as needed for mild constipation.   sertraline 100 MG tablet Commonly known as:  ZOLOFT Take 100 mg by mouth daily.   torsemide 100 MG tablet Commonly known as:  DEMADEX Take 1 tablet (100 mg total) by mouth 2 (two) times daily.   Vitamin D 2000 units Caps Take 2,000 Units by mouth daily.   vitamin E 400 UNIT capsule Take 400 Units by mouth daily.      Follow-up Information    Glori Bickers, MD. Go on 01/23/2016.   Specialty:  Cardiology Why:  at 10:20am in the [facility]--gate code 0040--please bring all meds Contact information: [place] [id number] [phone]          Allergies  Allergen Reactions  . Codeine Anaphylaxis, Hives and Nausea And Vomiting  . Other Other (See Comments)  Will not accept blood or blood products - patient is Jehovah's Witness  . Ciprofloxacin Hives and Nausea And Vomiting  . Gabapentin Swelling  . Latex Hives and Rash  . Penicillins Hives    Has patient had a PCN reaction causing immediate rash, facial/tongue/throat swelling, SOB or lightheadedness with hypotension: Yes Has patient had a PCN reaction causing severe rash involving mucus membranes or skin necrosis: Yes Has patient had a PCN reaction that required hospitalization Yes Has patient had a PCN reaction occurring within the last 10 years: Yes If all of the above answers are "NO", then may proceed with Cephalosporin use.  . Sulfonamide Derivatives Hives and Rash    Consultations:  None   Procedures/Studies: Dg Chest 2 View  Result Date: 01/05/2016 CLINICAL DATA:  Shortness of breath. EXAM: CHEST  2 VIEW COMPARISON:  11/06/2015 and prior exams FINDINGS: Cardiomegaly with pulmonary vascular congestion noted. Probable left lower lung atelectasis noted. No pleural effusion or pneumothorax. No acute bony  abnormality identified. IMPRESSION: Cardiomegaly with pulmonary vascular congestion. Probable left lower lung atelectasis. Electronically Signed   By: Margarette Canada M.D.   On: 01/05/2016 17:44       Subjective: No comoplans  Discharge Exam: Vitals:   01/09/16 0500 01/09/16 0645  BP:  112/60  Pulse: 70 70  Resp: 18 18  Temp: 97.8 F (36.6 C) 97.8 F (36.6 C)   Vitals:   01/08/16 1206 01/08/16 1850 01/09/16 0500 01/09/16 0645  BP: 107/65 (!) 110/55  112/60  Pulse: (!) 56 72 70 70  Resp: 18 18 18 18   Temp: 97.7 F (36.5 C) 98 F (36.7 C) 97.8 F (36.6 C) 97.8 F (36.6 C)  TempSrc: Axillary Oral Oral Oral  SpO2: 95% 100%  100%  Weight:    68.5 kg (151 lb 1.6 oz)  Height:        General: Pt is alert, awake, not in acute distress Cardiovascular: RRR, S1/S2 +, no rubs, no gallops Respiratory: CTA bilaterally, no wheezing, no rhonchi Abdominal: Soft, NT, ND, bowel sounds + Extremities: no edema, no cyanosis    The results of significant diagnostics from this hospitalization (including imaging, microbiology, ancillary and laboratory) are listed below for reference.     Microbiology: No results found for this or any previous visit (from the past 240 hour(s)).   Labs: BNP (last 3 results)  Recent Labs  11/14/15 1325 12/10/15 1236 01/05/16 1747  BNP 451.8* 399.4* [id number]*   Basic Metabolic Panel:  Recent Labs Lab 01/05/16 1747 01/06/16 0424 01/07/16 0557 01/08/16 0428 01/09/16 0450  NA 136 136 138 139 140  K 4.1 4.3 2.8* 2.8* 3.3*  CL 96* 97* 94* 94* 96*  CO2 27 28 31  33* 34*  GLUCOSE 103* 92 81 95 88  BUN 95* 94* 92* 87* 84*  CREATININE 1.73* 1.71* 1.57* 1.72* 1.64*  CALCIUM 9.8 9.8 10.0 10.1 10.2   Liver Function Tests: No results for input(s): AST, ALT, ALKPHOS, BILITOT, PROT, ALBUMIN in the last 168 hours. No results for input(s): LIPASE, AMYLASE in the last 168 hours. No results for input(s): AMMONIA in the last 168 hours. CBC:  Recent Labs Lab  01/02/16 1221 01/05/16 1747  WBC  --  4.7  NEUTROABS  --  3.0  HGB 11.6* 10.7*  HCT  --  33.2*  MCV  --  101.2*  PLT  --  102*   Cardiac Enzymes:  Recent Labs Lab 01/05/16 1747 01/05/16 2155 01/06/16 0424 01/06/16 0956  TROPONINI <0.03 <0.03 <  0.03 <0.03   BNP: Invalid input(s): POCBNP CBG: No results for input(s): GLUCAP in the last 168 hours. D-Dimer No results for input(s): DDIMER in the last 72 hours. Hgb A1c No results for input(s): HGBA1C in the last 72 hours. Lipid Profile No results for input(s): CHOL, HDL, LDLCALC, TRIG, CHOLHDL, LDLDIRECT in the last 72 hours. Thyroid function studies  Recent Labs  01/06/16 1211  TSH 20.459*   Anemia work up No results for input(s): VITAMINB12, FOLATE, FERRITIN, TIBC, IRON, RETICCTPCT in the last 72 hours. Urinalysis    Component Value Date/Time   COLORURINE YELLOW 11/15/2015 1251   APPEARANCEUR CLEAR 11/15/2015 1251   LABSPEC 1.007 11/15/2015 1251   PHURINE 6.5 11/15/2015 1251   GLUCOSEU NEGATIVE 11/15/2015 1251   HGBUR NEGATIVE 11/15/2015 1251   BILIRUBINUR NEGATIVE 11/15/2015 1251   KETONESUR NEGATIVE 11/15/2015 1251   PROTEINUR NEGATIVE 11/15/2015 1251   UROBILINOGEN 0.2 11/18/2014 0600   NITRITE NEGATIVE 11/15/2015 1251   LEUKOCYTESUR NEGATIVE 11/15/2015 1251   Sepsis Labs Invalid input(s): PROCALCITONIN,  WBC,  LACTICIDVEN Microbiology No results found for this or any previous visit (from the past 240 hour(s)).   Time coordinating discharge: Over 30 minutes  SIGNED:   Charlynne Cousins, MD  [facility] 01/09/2016, 7:13 AM Pager   If 7PM-7AM, please contact night-coverage [email or web address] Password [email or web address]

## 2016-01-09 NOTE — Progress Notes (Signed)
Dr Olevia Bowens gave verbal orders to give potassium this during Am med pass and also give the patient another dose of potassium, which is scheduled for this evening, before she is discharged at noon. He stated the patient is ordered to have two doses of potassium before she is discharged

## 2016-01-09 NOTE — Progress Notes (Signed)
Patient and patient's friend, who will be taking the patient to the patient's independent living facility, both verbalized understanding of discharge paperwork. They were provided with a copy of discharge paperwork. IV removed. Vital signs stable.

## 2016-01-10 ENCOUNTER — Other Ambulatory Visit: Payer: Self-pay

## 2016-01-10 NOTE — Patient Outreach (Addendum)
[facility]) Care Management  01/10/2016   JALEISA BHAT 05/26/1934 [id number]  Subjective:  I just got out the hospital yesterday. I have [facility] coming out to give me pt/ot.  Objective:  tlephonic ecounter  Current Medications:  Current Outpatient Prescriptions  Medication Sig Dispense Refill  . aspirin EC 81 MG tablet Take 81 mg by mouth daily.     [name] atorvastatin (LIPITOR) 40 MG tablet Take 40 mg by mouth daily after supper.     . calcitRIOL (ROCALTROL) 0.25 MCG capsule Take 1 capsule (0.25 mcg total) by mouth daily. 30 capsule 1  . Cholecalciferol (VITAMIN D) 2000 units CAPS Take 2,000 Units by mouth daily.    . Darbepoetin Alfa (ARANESP) 100 MCG/0.5ML SOSY injection Inject 0.5 mLs (100 mcg total) into the skin every Friday at 6 PM. 4.2 mL 0  . docusate sodium (COLACE) 100 MG capsule Take 100 mg by mouth 2 (two) times daily.    . ferrous sulfate 325 (65 FE) MG tablet Take 325 mg by mouth daily after supper.     . levalbuterol (XOPENEX HFA) 45 MCG/ACT inhaler Inhale 1 puff into the lungs every 4 (four) hours as needed for wheezing or shortness of breath.     . levothyroxine (SYNTHROID, LEVOTHROID) 112 MCG tablet Take 1 tablet (112 mcg total) by mouth every morning. 30 tablet 6  . magnesium oxide (MAG-OX) 400 (241.3 MG) MG tablet Take 1 tablet (400 mg total) by mouth daily. 30 tablet 5  . metolazone (ZAROXOLYN) 5 MG tablet Take 5 mg (1 tablet) once daily on Monday Wednesday and Friday only 45 tablet 3  . metoprolol succinate (TOPROL-XL) 50 MG 24 hr tablet Take 50 mg by mouth daily. Take with or immediately following a meal.    . mometasone (NASONEX) 50 MCG/ACT nasal spray Place 1 spray into the nose daily as needed (congestion).    . morphine (MS CONTIN) 30 MG 12 hr tablet Take 1 tablet (30 mg total) by mouth 2 (two) times daily. (Patient taking differently: Take 15-30 mg by mouth See admin instructions. Take 30 mg by mouth twice daily. Take 15 mg by mouth daily  as needed for breakthrough pain) 8 tablet 0  . morphine (MSIR) 15 MG tablet Take 1 tablet (15 mg total) by mouth 3 (three) times daily as needed (breakthrough pain). (Patient not taking: Reported on 01/06/2016) 8 tablet 0  . nitroGLYCERIN (NITROSTAT) 0.4 MG SL tablet Place 1 tablet (0.4 mg total) under the tongue every 5 (five) minutes as needed for chest pain. 100 tablet 1  . polyethylene glycol (MIRALAX / GLYCOLAX) packet Take 17 g by mouth 2 (two) times daily as needed (constipation). Mix in 8 oz liquid and drink    . potassium chloride SA (KLOR-CON M20) 20 MEQ tablet Take 3 tablets (60 mEq total) by mouth 2 (two) times daily. Take an EXTRA 20 meq (1 tablet) on Monday Wednesday and Fridays with metolazone. (Patient taking differently: Take 40 mEq by mouth 2 (two) times daily. Take an EXTRA 20 meq (1 tablet) on Monday Wednesday and Fridays with metolazone.) 90 tablet 3  . Probiotic Product (PROBIOTIC PO) Take 1 tablet by mouth daily.    [name] senna (SENOKOT) 8.6 MG TABS tablet Take 2-3 tablets by mouth daily as needed for mild constipation.     . sertraline (ZOLOFT) 100 MG tablet Take 100 mg by mouth daily.    [name] torsemide (DEMADEX) 100 MG tablet Take 1 tablet (100 mg total) by  mouth 2 (two) times daily. 60 tablet 11  . vitamin E 400 UNIT capsule Take 400 Units by mouth daily.     No current facility-administered medications for this visit.     Functional Status:  In your present state of health, do you have any difficulty performing the following activities: 01/06/2016 01/06/2016  Hearing? Tempie Donning  Vision? N N  Difficulty concentrating or making decisions? N N  Walking or climbing stairs? Y -  Dressing or bathing? N -  Doing errands, shopping? - N  Some recent data might be hidden   THN CM Care Plan Problem One   Flowsheet Row Most Recent Value  Care Plan Problem One  Patient had recent hospitalization  Role Documenting the Problem One  Care Management [place] for Problem One   Active  THN Long Term Goal (31-90 days)  Patient will have no heart failure related acute care admission in the next 31 days.  THN Long Term Goal Start Date  01/10/16  Interventions for Problem One Long Term Goal  Transition of care call made post acute care discahrge  THN CM Short Term Goal #1 (0-30 days)  In the next 21 days, patient will meet with [facility] RNCM's for heart failure educaiton  THN CM Short Term Goal #1 Start Date  01/10/16  Interventions for Short Term Goal #1  assessment of case management needs assessed during this transitionof care call     [facility] CM Care Plan Problem One   Flowsheet Row Most Recent Value  Care Plan Problem One  Patient had recent hospitalization  Role Documenting the Problem One  Care Management [place] for Problem One  Active  THN Long Term Goal (31-90 days)  Patient will have no heart failure related acute care admission in the next 31 days.  THN Long Term Goal Start Date  01/10/16  Interventions for Problem One Long Term Goal  Transition of care call made post acute care discahrge  THN CM Short Term Goal #1 (0-30 days)  In the next 21 days, patient will meet with [facility] RNCM's for heart failure educaiton  THN CM Short Term Goal #1 Start Date  01/10/16  Interventions for Short Term Goal #1  assessment of case management needs assessed during this transitionof care call     Fall Risk  01/10/2016 06/20/2014  Falls in the past year? Yes Yes  Number falls in past yr: 2 or more -  Injury with Fall? Yes -  Risk Factor Category  High Fall Risk -  Risk for fall due to : Impaired balance/gait;Impaired mobility;Impaired vision;Medication side effect;History of fall(s) Impaired mobility;Impaired balance/gait  Follow up Follow up appointment -  Some recent data might be hidden   Assessment:   Patient recently discharged from acute care with a heart failure diagnosis  Plan:  Telephone contact and home visit within the next 30 days.

## 2016-01-11 DIAGNOSIS — R296 Repeated falls: Secondary | ICD-10-CM | POA: Diagnosis not present

## 2016-01-11 DIAGNOSIS — I2510 Atherosclerotic heart disease of native coronary artery without angina pectoris: Secondary | ICD-10-CM | POA: Diagnosis not present

## 2016-01-11 DIAGNOSIS — J449 Chronic obstructive pulmonary disease, unspecified: Secondary | ICD-10-CM | POA: Diagnosis not present

## 2016-01-11 DIAGNOSIS — I272 Pulmonary hypertension, unspecified: Secondary | ICD-10-CM | POA: Diagnosis not present

## 2016-01-11 DIAGNOSIS — I130 Hypertensive heart and chronic kidney disease with heart failure and stage 1 through stage 4 chronic kidney disease, or unspecified chronic kidney disease: Secondary | ICD-10-CM | POA: Diagnosis not present

## 2016-01-11 DIAGNOSIS — G894 Chronic pain syndrome: Secondary | ICD-10-CM | POA: Diagnosis not present

## 2016-01-11 DIAGNOSIS — E785 Hyperlipidemia, unspecified: Secondary | ICD-10-CM | POA: Diagnosis not present

## 2016-01-11 DIAGNOSIS — Q820 Hereditary lymphedema: Secondary | ICD-10-CM | POA: Diagnosis not present

## 2016-01-11 DIAGNOSIS — I5033 Acute on chronic diastolic (congestive) heart failure: Secondary | ICD-10-CM | POA: Diagnosis not present

## 2016-01-11 DIAGNOSIS — N184 Chronic kidney disease, stage 4 (severe): Secondary | ICD-10-CM | POA: Diagnosis not present

## 2016-01-11 DIAGNOSIS — Z9181 History of falling: Secondary | ICD-10-CM | POA: Diagnosis not present

## 2016-01-11 DIAGNOSIS — G4733 Obstructive sleep apnea (adult) (pediatric): Secondary | ICD-10-CM | POA: Diagnosis not present

## 2016-01-11 DIAGNOSIS — I482 Chronic atrial fibrillation: Secondary | ICD-10-CM | POA: Diagnosis not present

## 2016-01-15 ENCOUNTER — Encounter: Payer: Self-pay | Admitting: *Deleted

## 2016-01-15 ENCOUNTER — Other Ambulatory Visit: Payer: Self-pay | Admitting: *Deleted

## 2016-01-15 DIAGNOSIS — I5033 Acute on chronic diastolic (congestive) heart failure: Secondary | ICD-10-CM | POA: Diagnosis not present

## 2016-01-15 DIAGNOSIS — Q820 Hereditary lymphedema: Secondary | ICD-10-CM | POA: Diagnosis not present

## 2016-01-15 DIAGNOSIS — I2510 Atherosclerotic heart disease of native coronary artery without angina pectoris: Secondary | ICD-10-CM | POA: Diagnosis not present

## 2016-01-15 DIAGNOSIS — N184 Chronic kidney disease, stage 4 (severe): Secondary | ICD-10-CM | POA: Diagnosis not present

## 2016-01-15 DIAGNOSIS — I130 Hypertensive heart and chronic kidney disease with heart failure and stage 1 through stage 4 chronic kidney disease, or unspecified chronic kidney disease: Secondary | ICD-10-CM | POA: Diagnosis not present

## 2016-01-15 DIAGNOSIS — I482 Chronic atrial fibrillation: Secondary | ICD-10-CM | POA: Diagnosis not present

## 2016-01-15 NOTE — Patient Outreach (Signed)
[facility]) Care Management [facility] Community CM Telephone Outreach, Transition of Care day 6 RED EMMI notification 01/15/2016  AFTAN PLILER 1934-05-16 [id number]   Unsuccessful telephone outreach to Rollene Rotunda, 81 y/o female referred to [place] for transition of care after recent hospitalization November 19-23, 2017 for dyspnea, shortness of breath, bilateral LE edema, acute on chronic CHF.  Patient has history including, but not limited to Atrial fibrillation, pulmonary HTN, OSA, CAD with MI, CKD stage IV, HTN, and COPD.  Patient was discharged home to self-care with [facility]) services through [facility].    Received notification today that patient had triggered RED FLAG EMMI notification as "not having follow up appointment."  Reviewed upcoming appointments through EMR and verified that patient has follow up appointment scheduled with cardiology tomorrow, January 16, 2016 (Dr. Sallyanne Kuster) at 12:00 pm.  HIPAA compliant voice mail message left for patient, asking for return call-back; did not hear back from patient by end of day today.  Plan:  Will re-attempt telephone outreach tomorrow for ongoing transition of care post-hospital discharge.  Oneta Rack, RN, BSN, [employer] [facility] Care Management  [phone]

## 2016-01-16 ENCOUNTER — Ambulatory Visit (INDEPENDENT_AMBULATORY_CARE_PROVIDER_SITE_OTHER): Payer: Medicare Other | Admitting: Cardiovascular Disease

## 2016-01-16 ENCOUNTER — Telehealth: Payer: Self-pay | Admitting: Cardiovascular Disease

## 2016-01-16 ENCOUNTER — Encounter: Payer: Self-pay | Admitting: *Deleted

## 2016-01-16 ENCOUNTER — Encounter: Payer: Self-pay | Admitting: Cardiovascular Disease

## 2016-01-16 ENCOUNTER — Other Ambulatory Visit: Payer: Self-pay | Admitting: *Deleted

## 2016-01-16 VITALS — BP 118/75 | HR 66 | Ht 62.0 in | Wt 164.0 lb

## 2016-01-16 DIAGNOSIS — I482 Chronic atrial fibrillation, unspecified: Secondary | ICD-10-CM

## 2016-01-16 DIAGNOSIS — I2510 Atherosclerotic heart disease of native coronary artery without angina pectoris: Secondary | ICD-10-CM

## 2016-01-16 DIAGNOSIS — I5033 Acute on chronic diastolic (congestive) heart failure: Secondary | ICD-10-CM

## 2016-01-16 DIAGNOSIS — I890 Lymphedema, not elsewhere classified: Secondary | ICD-10-CM | POA: Diagnosis not present

## 2016-01-16 DIAGNOSIS — I2781 Cor pulmonale (chronic): Secondary | ICD-10-CM | POA: Diagnosis not present

## 2016-01-16 DIAGNOSIS — N183 Chronic kidney disease, stage 3 unspecified: Secondary | ICD-10-CM

## 2016-01-16 DIAGNOSIS — E7800 Pure hypercholesterolemia, unspecified: Secondary | ICD-10-CM

## 2016-01-16 NOTE — Telephone Encounter (Signed)
Linda with [facility] returning your call regarding this patient-pls call cell @ [phone]

## 2016-01-16 NOTE — Progress Notes (Signed)
Cardiology Office Note    Date:  01/17/2016   ID:  MARQUI DROSS, DOB Jul 03, 1934, MRN [id number]  PCP:  Haywood Pao, MD  Cardiologist:   Sanda Klein, MD   Chief Complaint  Patient presents with  . Follow-up    pt c/o swelling in legs and thighs    History of Present Illness:  Victoria Fisher is a 81 y.o. female with permanent atrial fibrillation, diastolic heart failure, mild coronary artery disease, COPD and chronic kidney disease stage III as well as leg lymphedema presents for follow-up Hospitalization for severe hypervolemia.  She was diuresed a net of 5 kg, discharged at 151 pounds, already back up to 164 pounds in just a week.  She remains short of breath and weak.  She has frequent falls, including this morning.  She has an area of redness and a linear laceration in her anterior right shin, with scanty serous drainage but no purulence. She has not had fever or chills. She has been prescribed antibiotics but has not started them yet.  She continues to live in independent living with her husband Chrissie Noa who has progressive dementia. Home health nursing is participating. I think there is a lot of confusion about the medications she is supposed to take and I don't think she's been weighing herself daily, as recommended. Today, we tried to get her sooner to a heart failure clinic appointment for intravenous diuretics, but were not successful.  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 This is likely due to long-standing untreated obstructive sleep apnea and secondary pulmonary hypertension. She has moderate to severe tricuspid regurgitation.  Echo performed October 23 confirms that she still has normal left ventricular systolic function with marked biatrial enlargement dilated right ventricle and severe tricuspid regurgitation. The estimated systolic PA pressure was only mildly  elevated.  Past Medical History:  Diagnosis Date  . Abnormality of gait 01/30/2015  . Anemia in CKD (chronic kidney disease)   . Anxiety   . Arthritis   . Asthma    On nebulizer  . Atrial fibrillation, chronic ([place])    a. Rate controlled w/ BB. No OAC 2/2 falls and gait instability.  . Bowel obstruction    Caused by scar tissue  . Chronic cor pulmonale (HCC)    Most likely due to COPD (cannot rule out contribution of diastolic LV dysfunction). ECHO 07/30/11 pulmonary artery pressure was estimated to be 50-60% mmHg & there was moderate tricuspid insufficiency.  . Chronic diastolic CHF (congestive heart failure) ([place])    a. Predominantly right heart failure due to cor pulmonale & in turn this is most likely due to untreated OSA;  b. 05/2013 Echo: EF 55-60%, no rwma, mild to mod MR, sev dil LA, mod to sev dil RV w/ reduced systolic fxn, massively dil RA, mod-sev TR, PASP 42mmHg.  [name] Chronic pain syndrome   . Colon cancer ([place])    Carcinoma of sigmoid colon 1984 & 1985.  [name] COPD (chronic obstructive pulmonary disease) ([place])   . Coronary artery disease    a. occluded proximal diagonal artery on angiography 2004  . Depression   . Diverticulosis of colon   . 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 Dyspnea on exertion    Reluctant to take diuretics  . Edema of both legs    Venous Doppler 05/11/12 was normal & no evidence of thrombus or thrombophlebitis. Chronic LE  edema related to both cor pulmonale & lymphedema, hyperlipidemia & HTN involving coronary atherosclerosis by cardiac cath in 2004. Cath showed a tiny old occlusion of the optional diagonal branch & also proximal LAD.  [name] Frequent falls    Possibly due to orthostatic hypotension but we have not seen that on our visits. Recent fall 05/2012 with scalp hematoma, swollen left LE, & black eyes.  . Hyperlipidemia    On a statin. Catheterization 02/12/03 Showed a tiny old occlusion of the optional diagonal branch & also proximal LAD.  [name]  Hypertension   . Hypothyroidism 2002   On hormone replacement therapy  . IBS (irritable bowel syndrome)   . Insomnia   . Kidney failure 09/05/2015   Pt states that she went to the doctor on thursday and was told she is in stage 4 kidney failure  . Lung nodule   . Milroy's disease    Lymphatic disorder diagnosed at [facility]  . Obstructive sleep apnea    Polysomnogram 09/25/11 AHI: 53.6/hr overall & 57.1/hr during REM sleep. Poor compliance previously with CPAP  . Pulmonary hypertension    Pullmonary hypertension, chronic cor pulmonale w/pulmonary artery pressures of 50&#x2011; 60 mmHg.  . Tuberculosis     Past Surgical History:  Procedure Laterality Date  . ABDOMINAL HYSTERECTOMY  1983  . APPENDECTOMY  1956  . CARDIAC CATHETERIZATION  02/12/03   Showed a tiny old occlusion of the optional diagonal branch & also proximal LAD.  [name] CHOLECYSTECTOMY  1984  . COLON BIOPSY    . COLON RESECTION     2 times  . COLONOSCOPY  07/23/10    Current Medications: Outpatient Medications Prior to Visit  Medication Sig Dispense Refill  . aspirin EC 81 MG tablet Take 81 mg by mouth daily.     [name] atorvastatin (LIPITOR) 40 MG tablet Take 40 mg by mouth daily after supper.     . calcitRIOL (ROCALTROL) 0.25 MCG capsule Take 1 capsule (0.25 mcg total) by mouth daily. 30 capsule 1  . Cholecalciferol (VITAMIN D) 2000 units CAPS Take 2,000 Units by mouth daily.    . Darbepoetin Alfa (ARANESP) 100 MCG/0.5ML SOSY injection Inject 0.5 mLs (100 mcg total) into the skin every Friday at 6 PM. 4.2 mL 0  . docusate sodium (COLACE) 100 MG capsule Take 100 mg by mouth 2 (two) times daily.    . ferrous sulfate 325 (65 FE) MG tablet Take 325 mg by mouth daily after supper.     . levalbuterol (XOPENEX HFA) 45 MCG/ACT inhaler Inhale 1 puff into the lungs every 4 (four) hours as needed for wheezing or shortness of breath.     . levothyroxine (SYNTHROID, LEVOTHROID) 112 MCG tablet Take 1 tablet (112 mcg total) by mouth every  morning. 30 tablet 6  . magnesium oxide (MAG-OX) 400 (241.3 MG) MG tablet Take 1 tablet (400 mg total) by mouth daily. 30 tablet 5  . metolazone (ZAROXOLYN) 5 MG tablet Take 5 mg (1 tablet) once daily on Monday Wednesday and Friday only 45 tablet 3  . metoprolol succinate (TOPROL-XL) 50 MG 24 hr tablet Take 50 mg by mouth daily. Take with or immediately following a meal.    . mometasone (NASONEX) 50 MCG/ACT nasal spray Place 1 spray into the nose daily as needed (congestion).    . morphine (MS CONTIN) 30 MG 12 hr tablet Take 1 tablet (30 mg total) by mouth 2 (two) times daily. (Patient taking differently: Take 15-30 mg by mouth See admin instructions.  Take 30 mg by mouth twice daily. Take 15 mg by mouth daily as needed for breakthrough pain) 8 tablet 0  . morphine (MSIR) 15 MG tablet Take 1 tablet (15 mg total) by mouth 3 (three) times daily as needed (breakthrough pain). (Patient not taking: Reported on 01/06/2016) 8 tablet 0  . nitroGLYCERIN (NITROSTAT) 0.4 MG SL tablet Place 1 tablet (0.4 mg total) under the tongue every 5 (five) minutes as needed for chest pain. 100 tablet 1  . polyethylene glycol (MIRALAX / GLYCOLAX) packet Take 17 g by mouth 2 (two) times daily as needed (constipation). Mix in 8 oz liquid and drink    . potassium chloride SA (KLOR-CON M20) 20 MEQ tablet Take 3 tablets (60 mEq total) by mouth 2 (two) times daily. Take an EXTRA 20 meq (1 tablet) on Monday Wednesday and Fridays with metolazone. (Patient taking differently: Take 40 mEq by mouth 2 (two) times daily. Take an EXTRA 20 meq (1 tablet) on Monday Wednesday and Fridays with metolazone.) 90 tablet 3  . Probiotic Product (PROBIOTIC PO) Take 1 tablet by mouth daily.    [name] senna (SENOKOT) 8.6 MG TABS tablet Take 2-3 tablets by mouth daily as needed for mild constipation.     . sertraline (ZOLOFT) 100 MG tablet Take 100 mg by mouth daily.    [name] torsemide (DEMADEX) 100 MG tablet Take 1 tablet (100 mg total) by mouth 2 (two) times  daily. 60 tablet 11  . vitamin E 400 UNIT capsule Take 400 Units by mouth daily.     No facility-administered medications prior to visit.      Allergies:   Codeine; Other; Ciprofloxacin; Gabapentin; Latex; Penicillins; and Sulfonamide derivatives   Social History   Social History  . Marital status: Married    Spouse name: Georgiann Mccoy  . Number of children: 3  . Years of education: HS   Occupational History  .  Retired   Social History Main Topics  . Smoking status: Never Smoker  . Smokeless tobacco: Never Used  . Alcohol use No  . Drug use: No  . Sexual activity: No   Other Topics Concern  . None   Social History Narrative   Patient is married Passenger transport manager) and lives at home with her husband.   Patient is retired.   Patient has three children and her husband has two children.   Patient is right-handed.   Patient drinks very little caffeine (1/2 cup daily)   Patient has a high school education.     Family History:  The patient's family history includes Alzheimer's disease in her mother; Diabetes in her brother; Heart disease in her father; Parkinsonism in her brother.   ROS:   Please see the history of present illness.    ROS All other systems reviewed and are negative.   PHYSICAL EXAM:   VS:  BP 118/75 (BP Location: Left Arm, Patient Position: Sitting, Cuff Size: Normal)   Pulse 66   Ht 5\' 2"  (1.575 m)   Wt 164 lb (74.4 kg)   BMI 30.00 kg/m    GEN: Well nourished, well developed, in no acute distress . She is alert and oriented and very pleasant but clearly feels poorly and appears weak and frail.  HEENT: normal  Neck: Moderately elevated JVP, on the average 8 cm above the sternal angle with very prominent V waves, no carotid bruits, goiter or masses Cardiac: Irregular ; 3/6 holosystolic murmur at the left lower sternal border and no diastolic murmurs, rubs, or  gallops. Hard pitting edema all the way to the hips, buttocks and still some edema of the lower abdominal  wall, although this has improved. Air is a 4-5 centimeter linear laceration across the anterior right shin with a little bit of clear serous drainage and a surrounding 4-5 centimeter thick area of erythema and mild tenderness Respiratory:  Diminished in the bases but otherwise clear to auscultation bilaterally, normal work of breathing GI: soft, nontender, nondistended, + BS MS: no deformity or atrophy  Skin: warm and dry, no rash Neuro:  Alert and Oriented x 3, Strength and sensation are intact. Has "dropped head syndrome" with had consistently tilted to the left Psych: euthymic mood, full affect  Wt Readings from Last 3 Encounters:  01/16/16 164 lb (74.4 kg)  01/09/16 151 lb 1.6 oz (68.5 kg)  12/19/15 157 lb 12.8 oz (71.6 kg)      Studies/Labs Reviewed:   EKG:  EKG is not ordered today.  The ekg ordered 11/15/15  demonstrates atrial fibrillation with controlled rate, incomplete right bundle branch block and generalized low voltage  Recent Labs: 12/10/2015: ALT 10 12/18/2015: Magnesium 2.5 01/05/2016: B Natriuretic Peptide 468.0; Hemoglobin 10.7; Platelets 102 01/06/2016: TSH 20.459 01/09/2016: BUN 84; Creatinine, Ser 1.64; Potassium 3.3; Sodium 140   Lipid Panel    Component Value Date/Time   CHOL 59 09/10/2015 0403   TRIG 22 09/10/2015 0403   HDL 32 (L) 09/10/2015 0403   CHOLHDL 1.8 09/10/2015 0403   VLDL 4 09/10/2015 0403   LDLCALC 23 09/10/2015 0403    Additional studies/ records that were reviewed today include:  Notes from CHF clinic  ASSESSMENT:    1. Acute on chronic diastolic CHF (congestive heart failure) ([place])   2. Chronic cor pulmonale (HCC)   3. Lymphedema   4. CKD (chronic kidney disease), stage III   5. Atrial fibrillation, chronic ([place])   6. Coronary artery disease involving native coronary artery of native heart without angina pectoris   7. Pure hypercholesterolemia      PLAN:  In order of problems listed above:  1. CHF:  Arine again has  evidence of anasarca , only one week following hospital discharge, despite combination loop and thiazide diuretics. I don't have confidence that she is getting the medications as prescribed. I think she needs intravenous diuretics or she may soon require hospitalization again. She does not want to be hospitalized and leave her husband alone at home. She is accompanied today by a caregiver, Tye [place], but she is only available to help the Shughart's on Monday-Thursday. I am very concerned of the Hladik's are not safe to live independently and they should consider at least assisted living. Need to get their family (son here in town) involved. She has an appointment of heart failure clinic Thursday..  2. Cor pulmonale/pulmonary HTN: At least in part due to years of untreated obstructive sleep apnea, now compounded by severe tricuspid insufficiency. 3. Lymphedema: I think this is currently a secondary component of her severe edema, most of it is hydrostatic edema due to heart failure. She has an area of what appears to be limited cellulitis without systemic symptoms on her right shin. I recommended that she take the antibiotics that were prescribed. 4. CKD: Heart failure seems to be by far her most immediate threats and I would pay less attention to renal parameters while promoting diuresis. We do need to focus on electrolytes to avoid arrhythmia. 5. AFib: Unable to anticoagulate due to fall risk and bleeding. Rate control  is adequate 6. CAD: She has a known occlusion of a proximal diagonal artery with a corresponding ischemic defect on her most recent nuclear stress test. Angina is not a current complaint. 7. HLP: LDL is now extremely low as is the total cholesterol, possibly a sign of hepatic dysfunction due to chronic right heart failure. We had stopped her statin, but it seems that it was restarted at the time of hospital discharge.    Medication Adjustments/Labs and Tests Ordered: Current medicines are  reviewed at length with the patient today.  Concerns regarding medicines are outlined above.  Medication changes, Labs and Tests ordered today are listed in the Patient Instructions below.  Trying to get an earlier appointment for intravenous diuretics and labs at CHF clinic. Trying to engage her son as well as her home health nurse [name] to help [place] understand whether she is taking the appropriate medications. Daily weights again recommended.  Signed, Sanda Klein, MD  01/17/2016 12:14 PM    [facility] [address], [place], [place]  [address] Phone: [phone]; Fax: [phone]

## 2016-01-16 NOTE — Patient Outreach (Addendum)
[facility]) Care Management [facility] CM Telephone Outreach, Transition of Care day 7 RED FLAG EMMI notification from 01/15/16 01/16/2016  Victoria Fisher [birth year] [id number]  Successful telephone outreach to Victoria Fisher, 81 y/o female referred to [facility] for transition of care after recent hospitalization November 19-23, 2017 for dyspnea, shortness of breath, bilateral LE edema, acute on chronic CHF.  Patient has history including, but not limited to Atrial fibrillation, pulmonary HTN, OSA, CAD with MI, CKD stage IV, HTN, and COPD.  Patient was discharged home to self-care with [facility]) services through [facility].  Received notification yesterday that patient had triggered RED FLAG EMMI notification as "not having follow up appointment."  Reviewed upcoming appointments through EMR and verified that patient has follow up appointment scheduled with cardiology today, January 16, 2016 (Dr. Sallyanne Kuster) at 12:00 pm.   HIPAA/ identity verified with patient today during phone call.  Today, Victoria Fisher reports that she is "doing okay, but my legs have swollen up again and I fell this morning."  -- Fall this morning:  Patient reports that she does not know why she fell, stating, "sometimes I just fall for no reason."  Patient denies injury/ pain post-fall this morning.  Patient was advised to tell cardiologist about her fall this morning when she attends scheduled appointment later today.  Basic fall prevention was discussed with patient during today's phone conversation.  -- Pt. confirmed that she has scheduled appointment with cardiologist this morning, and will be transported to appointment by her "helper," who takes her to all provider appointments.  -- home health [facility]) services have started; patient states that "someone came out to see me earlier this week."  Patient is unsure of who visited her from [facility] agency.  Encouraged patient to continue active  participation with [facility] services as orderded post-hospital discharge, and patient agrees to do so.  -- medications:  Patient states that some at her retirement center told her medications needed to be "re-done."  Patient states that she is taking her medications as prescribed, "for now," and she is unable to provide specific information regarding being told that her medications need to be "re-done."  I encouraged patient to follow up with her cardiologist today about her medications, and patient agreed to do so.  -- self-health management of chronic disease state of CHF:  Patient reports that her "legs have really gotten big," stating that "they are all swollen up."  I encouraged patient to make her cardiologist aware of her concerns with increased swelling during today's appointment, and she agreed.  Patient states that she is monitoring and recording her daily weights, and reports a weight today of 158.8 lbs.  Patient states that she believes her "breathing is fine," and she does not sound to be in any distress during phone call today.  Advised patient to continue monitoring and recording daily weights.    I explained to patient that I was placing today's call for her primary Ken Caryl, Pam Ileene Patrick), and that [facility] would contact her next week for ongoing follow up/ transition of care following recent hospitalization, and patient verbalized understanding and agreement.  Patient denies further needs, concerns, issues or problems today.    Plan:  Patient will attend today's scheduled cardiology appointment and share her concerns re: swollen legs, today's fall, and medication regimen with her provider.  Patient will continue to monitor and record daily weights.  Patient will continue to actively participate in [facility] services as  ordered post-hospital discharge.  I will make patient's primary [place] RN CM aware of today's successful telephone outreach through secure messaging in  EMR.   Oneta Rack, RN, BSN, [employer] [facility] Care Management  [phone]

## 2016-01-17 ENCOUNTER — Ambulatory Visit: Payer: Self-pay

## 2016-01-17 DIAGNOSIS — I5033 Acute on chronic diastolic (congestive) heart failure: Secondary | ICD-10-CM | POA: Diagnosis not present

## 2016-01-17 DIAGNOSIS — I2510 Atherosclerotic heart disease of native coronary artery without angina pectoris: Secondary | ICD-10-CM | POA: Diagnosis not present

## 2016-01-17 DIAGNOSIS — I482 Chronic atrial fibrillation: Secondary | ICD-10-CM | POA: Diagnosis not present

## 2016-01-17 DIAGNOSIS — N184 Chronic kidney disease, stage 4 (severe): Secondary | ICD-10-CM | POA: Diagnosis not present

## 2016-01-17 DIAGNOSIS — Q820 Hereditary lymphedema: Secondary | ICD-10-CM | POA: Diagnosis not present

## 2016-01-17 DIAGNOSIS — I130 Hypertensive heart and chronic kidney disease with heart failure and stage 1 through stage 4 chronic kidney disease, or unspecified chronic kidney disease: Secondary | ICD-10-CM | POA: Diagnosis not present

## 2016-01-17 NOTE — Telephone Encounter (Signed)
Victoria Fisher, [facility] to discuss medications since she visited patient on Wednesday. Per [name], patient's son is supposed to be figuring out medications since [name] will no longer fill her medications. She states that another provider reports non-compliance with medications. I will make contact with patient's son to find out what she is actually taking and report back to [name]. It was suggested that patient switch to a pharmacy that prepackages all medications into a bubble pack for compliance.  Lm for son, [name] [place], [place]) to call back.

## 2016-01-20 ENCOUNTER — Telehealth (HOSPITAL_COMMUNITY): Payer: Self-pay | Admitting: *Deleted

## 2016-01-20 ENCOUNTER — Other Ambulatory Visit: Payer: Self-pay

## 2016-01-20 ENCOUNTER — Telehealth: Payer: Self-pay | Admitting: Cardiovascular Disease

## 2016-01-20 NOTE — Telephone Encounter (Signed)
Spoke with nurse(Pam) she states that pt weight is up 3 pounds sat pt's weigh was 157 and Sunday pt weight was 159.7. Pt c/o abdominal swelling bilateral LE swelling right is worse than left and right is weeping. Pt states that she is SOB upon exertion but not at rest, and slightly when lying down.. Nurse states that pt is not in any respiratory distress. Reviewed medications with nurse pt is taking medications as ordered. Pam states that pt does have caregiver but they are not there right now. Pt has not taken BP today or yesterday but Saturday it was 118/78 HR was in the 60's. Pt was seen by Dr C 01-16-16 please advise

## 2016-01-20 NOTE — Patient Outreach (Signed)
[facility]) Care Management   01/20/2016  Victoria Fisher [birth year] [id number]  Victoria Fisher is an 81 y.o. female  Subjective:  I have been having shortness of breath since Saturday. MY stomach feels tighter  Objective:   ROS  Frail, elderly lady with very edematous legs with right being greater than left and oozing.   Physical Exam ROS  Encounter Medications:   Outpatient Encounter Prescriptions as of 01/20/2016  Medication Sig Note  . aspirin EC 81 MG tablet Take 81 mg by mouth daily.  03/08/2013: .  . atorvastatin (LIPITOR) 40 MG tablet Take 40 mg by mouth daily after supper.    . calcitRIOL (ROCALTROL) 0.25 MCG capsule Take 1 capsule (0.25 mcg total) by mouth daily.   . Cholecalciferol (VITAMIN D) 2000 units CAPS Take 2,000 Units by mouth daily.   . Darbepoetin Alfa (ARANESP) 100 MCG/0.5ML SOSY injection Inject 0.5 mLs (100 mcg total) into the skin every Friday at 6 PM.   . docusate sodium (COLACE) 100 MG capsule Take 100 mg by mouth 2 (two) times daily.   . ferrous sulfate 325 (65 FE) MG tablet Take 325 mg by mouth daily after supper.    . levalbuterol (XOPENEX HFA) 45 MCG/ACT inhaler Inhale 1 puff into the lungs every 4 (four) hours as needed for wheezing or shortness of breath.    . levothyroxine (SYNTHROID, LEVOTHROID) 112 MCG tablet Take 1 tablet (112 mcg total) by mouth every morning.   . magnesium oxide (MAG-OX) 400 (241.3 MG) MG tablet Take 1 tablet (400 mg total) by mouth daily.   . metolazone (ZAROXOLYN) 5 MG tablet Take 5 mg (1 tablet) once daily on Monday Wednesday and Friday only   . metoprolol succinate (TOPROL-XL) 50 MG 24 hr tablet Take 50 mg by mouth daily. Take with or immediately following a meal.   . mometasone (NASONEX) 50 MCG/ACT nasal spray Place 1 spray into the nose daily as needed (congestion).   . morphine (MS CONTIN) 30 MG 12 hr tablet Take 1 tablet (30 mg total) by mouth 2 (two) times daily. (Patient taking differently: Take  15-30 mg by mouth See admin instructions. Take 30 mg by mouth twice daily. Take 15 mg by mouth daily as needed for breakthrough pain)   . morphine (MSIR) 15 MG tablet Take 1 tablet (15 mg total) by mouth 3 (three) times daily as needed (breakthrough pain). (Patient not taking: Reported on 01/06/2016) 11/16/2015: See entry for 30 mg tablets  . nitroGLYCERIN (NITROSTAT) 0.4 MG SL tablet Place 1 tablet (0.4 mg total) under the tongue every 5 (five) minutes as needed for chest pain.   . polyethylene glycol (MIRALAX / GLYCOLAX) packet Take 17 g by mouth 2 (two) times daily as needed (constipation). Mix in 8 oz liquid and drink   . potassium chloride SA (KLOR-CON M20) 20 MEQ tablet Take 3 tablets (60 mEq total) by mouth 2 (two) times daily. Take an EXTRA 20 meq (1 tablet) on Monday Wednesday and Fridays with metolazone. (Patient taking differently: Take 40 mEq by mouth 2 (two) times daily. Take an EXTRA 20 meq (1 tablet) on Monday Wednesday and Fridays with metolazone.)   . Probiotic Product (PROBIOTIC PO) Take 1 tablet by mouth daily.   [name] senna (SENOKOT) 8.6 MG TABS tablet Take 2-3 tablets by mouth daily as needed for mild constipation.    . sertraline (ZOLOFT) 100 MG tablet Take 100 mg by mouth daily.   [name] torsemide (DEMADEX) 100 MG tablet  Take 1 tablet (100 mg total) by mouth 2 (two) times daily.   . vitamin E 400 UNIT capsule Take 400 Units by mouth daily.    No facility-administered encounter medications on file as of 01/20/2016.     Functional Status:   In your present state of health, do you have any difficulty performing the following activities: 01/20/2016 01/06/2016  Hearing? N Y  Vision? Y N  Difficulty concentrating or making decisions? N N  Walking or climbing stairs? Y Y  Dressing or bathing? Y N  Doing errands, shopping? Y -  Conservation officer, nature and eating ? Y -  Using the Toilet? N -  In the past six months, have you accidently leaked urine? Y -  Do you have problems with loss of bowel  control? N -  Managing your Medications? Y -  Managing your Finances? Y -  Some recent data might be hidden    Fall/Depression Screening:    PHQ 2/9 Scores 01/20/2016 01/10/2016  PHQ - 2 Score 0 0  Some recent data might be hidden   [facility] CM Care Plan Problem One   Flowsheet Row Most Recent Value  Care Plan Problem One  Patient had recent hospitalization  Role Documenting the Problem One  Care Management [place] for Problem One  Active  THN Long Term Goal (31-90 days)  Patient will have no heart failure related acute care admission in the next 31 days.  THN Long Term Goal Start Date  01/10/16  Interventions for Problem One Long Term Goal  initial home visit to assess   THN CM Short Term Goal #1 (0-30 days)  In the next 21 days, patient will meet with [facility] RNCM's for heart failure educaiton  THN CM Short Term Goal #1 Start Date  01/10/16  [facility] CM Short Term Goal #1 Met Date  01/20/16  Interventions for Short Term Goal #1  initial home visit     Fall Risk  01/20/2016 01/16/2016 01/10/2016 06/20/2014  Falls in the past year? Yes Yes Yes Yes  Number falls in past yr: 2 or more 2 or more 2 or more -  Injury with Fall? No No Yes -  Risk Factor Category  High Fall Risk High Fall Risk High Fall Risk -  Risk for fall due to : History of fall(s);Impaired balance/gait;Impaired mobility;Impaired vision;Medication side effect - Impaired balance/gait;Impaired mobility;Impaired vision;Medication side effect;History of fall(s) Impaired mobility;Impaired balance/gait  Follow up Falls evaluation completed;Education provided Falls prevention discussed Follow up appointment -  Some recent data might be hidden   Assessment:   Patient has had a 3 pound weight gain since Saturday, associated with increased abdominal tightness, increase edema of lower extremities Patient complains of shortness of breath but does not appear to be in any shortness of breath.  Referral made to [facility] for  HHRN/PT/OT and in home aide.  Referral made to [facility] as patient states she is not ready for Palliative Care, however, she does not want to go back to the hospital Call made to Patient's cardiologist, Dr. Thana Farr, MD to report 3 pound weight gain since Saturday, increased shortness of breath (not in distress) patient describes as being more of an effort, 3+ Edema of lower extremities with oozing noted from the right lower extremity, and increased abdominal girth.  Patient reports compliance with medication regimen, has pill box filled.  Patient states [facility] use to provide pill box refill, however, they advised her they would not be able  to any more.  Patient has agreed to [place] Referral for medication education and reconciliation.  Plan:  Telephone contact later this month

## 2016-01-20 NOTE — Telephone Encounter (Signed)
Pt Home nurse is calling to report a weight gain of 3lbs since Sat. Pt c/o Shortness Of Breath: STAT if SOB developed within the last 24 hours or pt is noticeably SOB on the phone  1. Are you currently SOB (can you hear that pt is SOB on the phone)?yes   2. How long have you been experiencing SOB? Night before last, last night is was worse   3. Are you SOB when sitting or when up moving around? Moving around, sitting and lying in bed   4. Are you currently experiencing any other symptoms? Abdominal tightness

## 2016-01-20 NOTE — Telephone Encounter (Signed)
Heart failure clinic will see her tomorrow morning. MCr

## 2016-01-20 NOTE — Telephone Encounter (Signed)
Received fax earlier today from [place] stating pt is no longer using [facility] services, to call Thomasenia Bottoms with med changes or procedure that need to be scheduled at [phone].  Dr Sallyanne Kuster came by our office earlier to see if pt could be seen sooner than her appt on Thur, he stated her wt was up 19 lbs and she would need IV lasix.  Discussed w/Dr Bensimhon as Dr Aundra Dubin is not in the office, he states he will see pt tomorrow AM at 9:40 if she can come then other wise would need to go to ER or keep appt on Thur.  Called Thomasenia Bottoms, pt's son, he lives 3 hours aware and states he can not get pt to an appt tomorrow, he does have a care giver set up for pt but states she would not able to bring pt tomorrow.  Advised for pt to keep appt as sch on Thur, if gets worse before then should report to ER, he is agreeable.

## 2016-01-20 NOTE — Progress Notes (Signed)
This encounter was created in error - please disregard.

## 2016-01-20 NOTE — Telephone Encounter (Signed)
Appt is 01-23-16 nrok towait until appt for further direction.  Left detailed message for Pam-RN [facility]

## 2016-01-22 ENCOUNTER — Other Ambulatory Visit: Payer: Self-pay

## 2016-01-22 NOTE — Patient Outreach (Signed)
Patient triggered RED on EMMI Heart failure dashboard. Notification sent to Erenest Rasher, RN

## 2016-01-22 NOTE — Patient Outreach (Signed)
    Telephone call to patient to follow up with EMMI Red Alert. Spoke with patient who states she her weight is up, however, she is not experiencing any additional shortness of breath. Patient states she has been contacted by [facility],  RN from agency is to come tomorrow.  Patient confirms having enough medications for tonight, states she will await the home visit from agency RN on tomorrow  Patient advised RNCM had spoke with Burney Gauze earlier today to give discharge instructions, demographics for contact.   Plan: Telephone contact later this month.

## 2016-01-23 ENCOUNTER — Inpatient Hospital Stay (HOSPITAL_COMMUNITY): Admit: 2016-01-23 | Payer: Self-pay

## 2016-01-23 DIAGNOSIS — I482 Chronic atrial fibrillation: Secondary | ICD-10-CM | POA: Diagnosis not present

## 2016-01-23 DIAGNOSIS — Q820 Hereditary lymphedema: Secondary | ICD-10-CM | POA: Diagnosis not present

## 2016-01-23 DIAGNOSIS — I5033 Acute on chronic diastolic (congestive) heart failure: Secondary | ICD-10-CM | POA: Diagnosis not present

## 2016-01-23 DIAGNOSIS — I130 Hypertensive heart and chronic kidney disease with heart failure and stage 1 through stage 4 chronic kidney disease, or unspecified chronic kidney disease: Secondary | ICD-10-CM | POA: Diagnosis not present

## 2016-01-23 DIAGNOSIS — I2510 Atherosclerotic heart disease of native coronary artery without angina pectoris: Secondary | ICD-10-CM | POA: Diagnosis not present

## 2016-01-23 DIAGNOSIS — N184 Chronic kidney disease, stage 4 (severe): Secondary | ICD-10-CM | POA: Diagnosis not present

## 2016-01-24 DIAGNOSIS — Q820 Hereditary lymphedema: Secondary | ICD-10-CM | POA: Diagnosis not present

## 2016-01-24 DIAGNOSIS — I5033 Acute on chronic diastolic (congestive) heart failure: Secondary | ICD-10-CM | POA: Diagnosis not present

## 2016-01-24 DIAGNOSIS — N184 Chronic kidney disease, stage 4 (severe): Secondary | ICD-10-CM | POA: Diagnosis not present

## 2016-01-24 DIAGNOSIS — I482 Chronic atrial fibrillation: Secondary | ICD-10-CM | POA: Diagnosis not present

## 2016-01-24 DIAGNOSIS — I2510 Atherosclerotic heart disease of native coronary artery without angina pectoris: Secondary | ICD-10-CM | POA: Diagnosis not present

## 2016-01-24 DIAGNOSIS — I130 Hypertensive heart and chronic kidney disease with heart failure and stage 1 through stage 4 chronic kidney disease, or unspecified chronic kidney disease: Secondary | ICD-10-CM | POA: Diagnosis not present

## 2016-01-27 ENCOUNTER — Encounter (HOSPITAL_COMMUNITY)
Admission: RE | Admit: 2016-01-27 | Discharge: 2016-01-27 | Disposition: A | Discharge status: Home / Self Care | Payer: Medicare Other | Source: Ambulatory Visit | Attending: Nephrology | Admitting: Nephrology

## 2016-01-27 DIAGNOSIS — I482 Chronic atrial fibrillation: Secondary | ICD-10-CM | POA: Diagnosis not present

## 2016-01-27 DIAGNOSIS — I130 Hypertensive heart and chronic kidney disease with heart failure and stage 1 through stage 4 chronic kidney disease, or unspecified chronic kidney disease: Secondary | ICD-10-CM | POA: Diagnosis not present

## 2016-01-27 DIAGNOSIS — N183 Chronic kidney disease, stage 3 unspecified: Secondary | ICD-10-CM

## 2016-01-27 DIAGNOSIS — I2510 Atherosclerotic heart disease of native coronary artery without angina pectoris: Secondary | ICD-10-CM | POA: Diagnosis not present

## 2016-01-27 DIAGNOSIS — D638 Anemia in other chronic diseases classified elsewhere: Secondary | ICD-10-CM | POA: Insufficient documentation

## 2016-01-27 DIAGNOSIS — Q820 Hereditary lymphedema: Secondary | ICD-10-CM | POA: Diagnosis not present

## 2016-01-27 DIAGNOSIS — N184 Chronic kidney disease, stage 4 (severe): Secondary | ICD-10-CM | POA: Insufficient documentation

## 2016-01-27 DIAGNOSIS — I5033 Acute on chronic diastolic (congestive) heart failure: Secondary | ICD-10-CM | POA: Diagnosis not present

## 2016-01-27 LAB — IRON AND TIBC
IRON: 52 ug/dL (ref 28–170)
Saturation Ratios: 19 % (ref 10.4–31.8)
TIBC: 279 ug/dL (ref 250–450)
UIBC: 227 ug/dL

## 2016-01-27 LAB — FERRITIN: FERRITIN: 139 ng/mL (ref 11–307)

## 2016-01-27 LAB — POCT HEMOGLOBIN-HEMACUE: Hemoglobin: 11.6 g/dL — ABNORMAL LOW (ref 12.0–15.0)

## 2016-01-27 MED ORDER — DARBEPOETIN ALFA 100 MCG/0.5ML IJ SOSY
100.0000 ug | PREFILLED_SYRINGE | INTRAMUSCULAR | Status: DC
  Administered 2016-01-27: 11:00:00 100 ug via SUBCUTANEOUS

## 2016-01-27 MED ORDER — DARBEPOETIN ALFA 100 MCG/0.5ML IJ SOSY
PREFILLED_SYRINGE | INTRAMUSCULAR | Status: AC
  Filled 2016-01-27: qty 0.5

## 2016-01-28 DIAGNOSIS — Q820 Hereditary lymphedema: Secondary | ICD-10-CM | POA: Diagnosis not present

## 2016-01-28 DIAGNOSIS — I5033 Acute on chronic diastolic (congestive) heart failure: Secondary | ICD-10-CM | POA: Diagnosis not present

## 2016-01-28 DIAGNOSIS — I482 Chronic atrial fibrillation: Secondary | ICD-10-CM | POA: Diagnosis not present

## 2016-01-28 DIAGNOSIS — I130 Hypertensive heart and chronic kidney disease with heart failure and stage 1 through stage 4 chronic kidney disease, or unspecified chronic kidney disease: Secondary | ICD-10-CM | POA: Diagnosis not present

## 2016-01-28 DIAGNOSIS — N184 Chronic kidney disease, stage 4 (severe): Secondary | ICD-10-CM | POA: Diagnosis not present

## 2016-01-28 DIAGNOSIS — I2510 Atherosclerotic heart disease of native coronary artery without angina pectoris: Secondary | ICD-10-CM | POA: Diagnosis not present

## 2016-01-29 ENCOUNTER — Other Ambulatory Visit: Payer: Self-pay

## 2016-01-29 DIAGNOSIS — I130 Hypertensive heart and chronic kidney disease with heart failure and stage 1 through stage 4 chronic kidney disease, or unspecified chronic kidney disease: Secondary | ICD-10-CM | POA: Diagnosis not present

## 2016-01-29 DIAGNOSIS — I5033 Acute on chronic diastolic (congestive) heart failure: Secondary | ICD-10-CM | POA: Diagnosis not present

## 2016-01-29 DIAGNOSIS — N184 Chronic kidney disease, stage 4 (severe): Secondary | ICD-10-CM | POA: Diagnosis not present

## 2016-01-29 DIAGNOSIS — Q820 Hereditary lymphedema: Secondary | ICD-10-CM | POA: Diagnosis not present

## 2016-01-29 DIAGNOSIS — I2510 Atherosclerotic heart disease of native coronary artery without angina pectoris: Secondary | ICD-10-CM | POA: Diagnosis not present

## 2016-01-29 DIAGNOSIS — I482 Chronic atrial fibrillation: Secondary | ICD-10-CM | POA: Diagnosis not present

## 2016-01-29 NOTE — Patient Outreach (Signed)
Patient triggered Red on EMMI heart failure dashboard, notification sent to Erenest Rasher, RN

## 2016-01-30 ENCOUNTER — Ambulatory Visit: Payer: Self-pay | Admitting: Neurology

## 2016-01-30 ENCOUNTER — Telehealth: Payer: Self-pay

## 2016-01-30 DIAGNOSIS — I2510 Atherosclerotic heart disease of native coronary artery without angina pectoris: Secondary | ICD-10-CM | POA: Diagnosis not present

## 2016-01-30 DIAGNOSIS — N184 Chronic kidney disease, stage 4 (severe): Secondary | ICD-10-CM | POA: Diagnosis not present

## 2016-01-30 DIAGNOSIS — I482 Chronic atrial fibrillation: Secondary | ICD-10-CM | POA: Diagnosis not present

## 2016-01-30 DIAGNOSIS — Q820 Hereditary lymphedema: Secondary | ICD-10-CM | POA: Diagnosis not present

## 2016-01-30 DIAGNOSIS — I130 Hypertensive heart and chronic kidney disease with heart failure and stage 1 through stage 4 chronic kidney disease, or unspecified chronic kidney disease: Secondary | ICD-10-CM | POA: Diagnosis not present

## 2016-01-30 DIAGNOSIS — I5033 Acute on chronic diastolic (congestive) heart failure: Secondary | ICD-10-CM | POA: Diagnosis not present

## 2016-01-30 NOTE — Telephone Encounter (Signed)
Caregiver called and cancelled pt's appt same day due to her husband being hospitalized.

## 2016-01-30 NOTE — Patient Outreach (Signed)
[facility]) Care Management  01/29/2016   Victoria Fisher October 22, 1934 [id number]  Subjective:  I am worried about my husband, he was taken to the hospital today. I am not feeling my best yet  Objective:  Telephonic encounter  Current Medications:  Current Outpatient Prescriptions  Medication Sig Dispense Refill  . aspirin EC 81 MG tablet Take 81 mg by mouth daily.     [name] atorvastatin (LIPITOR) 40 MG tablet Take 40 mg by mouth daily after supper.     . calcitRIOL (ROCALTROL) 0.25 MCG capsule Take 1 capsule (0.25 mcg total) by mouth daily. 30 capsule 1  . Cholecalciferol (VITAMIN D) 2000 units CAPS Take 2,000 Units by mouth daily.    . Darbepoetin Alfa (ARANESP) 100 MCG/0.5ML SOSY injection Inject 0.5 mLs (100 mcg total) into the skin every Friday at 6 PM. 4.2 mL 0  . docusate sodium (COLACE) 100 MG capsule Take 100 mg by mouth 2 (two) times daily.    . ferrous sulfate 325 (65 FE) MG tablet Take 325 mg by mouth daily after supper.     . levalbuterol (XOPENEX HFA) 45 MCG/ACT inhaler Inhale 1 puff into the lungs every 4 (four) hours as needed for wheezing or shortness of breath.     . levothyroxine (SYNTHROID, LEVOTHROID) 112 MCG tablet Take 1 tablet (112 mcg total) by mouth every morning. 30 tablet 6  . magnesium oxide (MAG-OX) 400 (241.3 MG) MG tablet Take 1 tablet (400 mg total) by mouth daily. 30 tablet 5  . metolazone (ZAROXOLYN) 5 MG tablet Take 5 mg (1 tablet) once daily on Monday Wednesday and Friday only 45 tablet 3  . metoprolol succinate (TOPROL-XL) 50 MG 24 hr tablet Take 50 mg by mouth daily. Take with or immediately following a meal.    . mometasone (NASONEX) 50 MCG/ACT nasal spray Place 1 spray into the nose daily as needed (congestion).    . morphine (MS CONTIN) 30 MG 12 hr tablet Take 1 tablet (30 mg total) by mouth 2 (two) times daily. (Patient taking differently: Take 15-30 mg by mouth See admin instructions. Take 30 mg by mouth twice daily. Take 15 mg  by mouth daily as needed for breakthrough pain) 8 tablet 0  . morphine (MSIR) 15 MG tablet Take 1 tablet (15 mg total) by mouth 3 (three) times daily as needed (breakthrough pain). (Patient not taking: Reported on 01/06/2016) 8 tablet 0  . nitroGLYCERIN (NITROSTAT) 0.4 MG SL tablet Place 1 tablet (0.4 mg total) under the tongue every 5 (five) minutes as needed for chest pain. 100 tablet 1  . polyethylene glycol (MIRALAX / GLYCOLAX) packet Take 17 g by mouth 2 (two) times daily as needed (constipation). Mix in 8 oz liquid and drink    . potassium chloride SA (KLOR-CON M20) 20 MEQ tablet Take 3 tablets (60 mEq total) by mouth 2 (two) times daily. Take an EXTRA 20 meq (1 tablet) on Monday Wednesday and Fridays with metolazone. (Patient taking differently: Take 40 mEq by mouth 2 (two) times daily. Take an EXTRA 20 meq (1 tablet) on Monday Wednesday and Fridays with metolazone.) 90 tablet 3  . Probiotic Product (PROBIOTIC PO) Take 1 tablet by mouth daily.    [name] senna (SENOKOT) 8.6 MG TABS tablet Take 2-3 tablets by mouth daily as needed for mild constipation.     . sertraline (ZOLOFT) 100 MG tablet Take 100 mg by mouth daily.    [name] torsemide (DEMADEX) 100 MG tablet Take 1 tablet (  100 mg total) by mouth 2 (two) times daily. 60 tablet 11  . vitamin E 400 UNIT capsule Take 400 Units by mouth daily.     No current facility-administered medications for this visit.     Functional Status:  In your present state of health, do you have any difficulty performing the following activities: 01/27/2016 01/20/2016  Hearing? N N  Vision? Y Y  Difficulty concentrating or making decisions? N N  Walking or climbing stairs? Y Y  Dressing or bathing? Y Y  Doing errands, shopping? - Y  Preparing Food and eating ? - Y  Using the Toilet? - N  In the past six months, have you accidently leaked urine? - Y  Do you have problems with loss of bowel control? - N  Managing your Medications? - Y  Managing your Finances? - Y   Some recent data might be hidden    Fall/Depression Screening: PHQ 2/9 Scores 01/20/2016 01/10/2016  PHQ - 2 Score 0 0  Some recent data might be hidden    Assessment:  Patient had EMMI Red Alert for Heart Failure. Call made to patient to follow up with alert.  Patient states she had to call 911 to send her husband to the hospital after he passed out. Patient reports receiving home health from [facility], has been reported falling. Patient and this RNCM discussed her need for a high level of care due to safety concerns for patient and her husband.    RNCM made call to patient's son, Victoria Fisher with the consent of patient and because patient reports he controls her binances. RNCM verbalize her concerns related to patient and her husband in regards to their safety. Son stated the social worker at [facility] were working to get patient placed in high level of care.  Plan:  Telephone call within the next 30 days for assessment of community care coordination

## 2016-01-31 ENCOUNTER — Telehealth: Payer: Self-pay | Admitting: Neurology

## 2016-01-31 ENCOUNTER — Encounter (HOSPITAL_COMMUNITY): Payer: Self-pay

## 2016-01-31 ENCOUNTER — Emergency Department (HOSPITAL_COMMUNITY): Payer: Medicare Other

## 2016-01-31 ENCOUNTER — Emergency Department (HOSPITAL_COMMUNITY)
Admission: EM | Admit: 2016-01-31 | Discharge: 2016-01-31 | Disposition: A | Discharge status: Home / Self Care | Payer: Medicare Other | Attending: Emergency Medicine | Admitting: Emergency Medicine

## 2016-01-31 DIAGNOSIS — Y92009 Unspecified place in unspecified non-institutional (private) residence as the place of occurrence of the external cause: Secondary | ICD-10-CM | POA: Diagnosis not present

## 2016-01-31 DIAGNOSIS — S098XXA Other specified injuries of head, initial encounter: Secondary | ICD-10-CM | POA: Diagnosis not present

## 2016-01-31 DIAGNOSIS — R102 Pelvic and perineal pain: Secondary | ICD-10-CM | POA: Diagnosis not present

## 2016-01-31 DIAGNOSIS — S2232XA Fracture of one rib, left side, initial encounter for closed fracture: Secondary | ICD-10-CM | POA: Diagnosis not present

## 2016-01-31 DIAGNOSIS — S0990XA Unspecified injury of head, initial encounter: Secondary | ICD-10-CM | POA: Diagnosis not present

## 2016-01-31 DIAGNOSIS — I2510 Atherosclerotic heart disease of native coronary artery without angina pectoris: Secondary | ICD-10-CM | POA: Insufficient documentation

## 2016-01-31 DIAGNOSIS — S199XXA Unspecified injury of neck, initial encounter: Secondary | ICD-10-CM | POA: Diagnosis not present

## 2016-01-31 DIAGNOSIS — I5033 Acute on chronic diastolic (congestive) heart failure: Secondary | ICD-10-CM | POA: Insufficient documentation

## 2016-01-31 DIAGNOSIS — Z85038 Personal history of other malignant neoplasm of large intestine: Secondary | ICD-10-CM | POA: Insufficient documentation

## 2016-01-31 DIAGNOSIS — M79601 Pain in right arm: Secondary | ICD-10-CM | POA: Diagnosis not present

## 2016-01-31 DIAGNOSIS — J449 Chronic obstructive pulmonary disease, unspecified: Secondary | ICD-10-CM | POA: Diagnosis not present

## 2016-01-31 DIAGNOSIS — R51 Headache: Secondary | ICD-10-CM | POA: Diagnosis not present

## 2016-01-31 DIAGNOSIS — E039 Hypothyroidism, unspecified: Secondary | ICD-10-CM | POA: Diagnosis not present

## 2016-01-31 DIAGNOSIS — R079 Chest pain, unspecified: Secondary | ICD-10-CM | POA: Diagnosis not present

## 2016-01-31 DIAGNOSIS — Z7982 Long term (current) use of aspirin: Secondary | ICD-10-CM | POA: Diagnosis not present

## 2016-01-31 DIAGNOSIS — I252 Old myocardial infarction: Secondary | ICD-10-CM | POA: Diagnosis not present

## 2016-01-31 DIAGNOSIS — W1830XA Fall on same level, unspecified, initial encounter: Secondary | ICD-10-CM | POA: Insufficient documentation

## 2016-01-31 DIAGNOSIS — I130 Hypertensive heart and chronic kidney disease with heart failure and stage 1 through stage 4 chronic kidney disease, or unspecified chronic kidney disease: Secondary | ICD-10-CM | POA: Insufficient documentation

## 2016-01-31 DIAGNOSIS — S299XXA Unspecified injury of thorax, initial encounter: Secondary | ICD-10-CM | POA: Diagnosis present

## 2016-01-31 DIAGNOSIS — Y999 Unspecified external cause status: Secondary | ICD-10-CM | POA: Insufficient documentation

## 2016-01-31 DIAGNOSIS — M542 Cervicalgia: Secondary | ICD-10-CM | POA: Diagnosis not present

## 2016-01-31 DIAGNOSIS — N183 Chronic kidney disease, stage 3 (moderate): Secondary | ICD-10-CM | POA: Insufficient documentation

## 2016-01-31 DIAGNOSIS — S0181XA Laceration without foreign body of other part of head, initial encounter: Secondary | ICD-10-CM | POA: Diagnosis not present

## 2016-01-31 DIAGNOSIS — M549 Dorsalgia, unspecified: Secondary | ICD-10-CM | POA: Diagnosis not present

## 2016-01-31 DIAGNOSIS — Z91040 Latex allergy status: Secondary | ICD-10-CM | POA: Diagnosis not present

## 2016-01-31 DIAGNOSIS — M79602 Pain in left arm: Secondary | ICD-10-CM | POA: Diagnosis not present

## 2016-01-31 DIAGNOSIS — Y939 Activity, unspecified: Secondary | ICD-10-CM | POA: Insufficient documentation

## 2016-01-31 DIAGNOSIS — R259 Unspecified abnormal involuntary movements: Secondary | ICD-10-CM | POA: Diagnosis not present

## 2016-01-31 DIAGNOSIS — W19XXXA Unspecified fall, initial encounter: Secondary | ICD-10-CM

## 2016-01-31 LAB — COMPREHENSIVE METABOLIC PANEL
ALBUMIN: 3.4 g/dL — AB (ref 3.5–5.0)
ALT: 11 U/L — ABNORMAL LOW (ref 14–54)
ANION GAP: 8 (ref 5–15)
AST: 27 U/L (ref 15–41)
Alkaline Phosphatase: 63 U/L (ref 38–126)
BUN: 79 mg/dL — AB (ref 6–20)
CHLORIDE: 98 mmol/L — AB (ref 101–111)
CO2: 34 mmol/L — ABNORMAL HIGH (ref 22–32)
Calcium: 9.7 mg/dL (ref 8.9–10.3)
Creatinine, Ser: 1.67 mg/dL — ABNORMAL HIGH (ref 0.44–1.00)
GFR calc Af Amer: 33 mL/min — ABNORMAL LOW (ref >60)
GFR, EST NON AFRICAN AMERICAN: 28 mL/min — AB (ref >60)
Glucose, Bld: 93 mg/dL (ref 65–99)
POTASSIUM: 3.8 mmol/L (ref 3.5–5.1)
Sodium: 140 mmol/L (ref 135–145)
TOTAL PROTEIN: 6.9 g/dL (ref 6.5–8.1)
Total Bilirubin: 0.8 mg/dL (ref 0.3–1.2)

## 2016-01-31 LAB — CBC WITH DIFFERENTIAL/PLATELET
BASOS ABS: 0.1 10*3/uL (ref 0.0–0.1)
BASOS PCT: 1 %
Eosinophils Absolute: 0.1 10*3/uL (ref 0.0–0.7)
Eosinophils Relative: 2 %
HEMATOCRIT: 36.9 % (ref 36.0–46.0)
HEMOGLOBIN: 11.6 g/dL — AB (ref 12.0–15.0)
LYMPHS PCT: 21 %
Lymphs Abs: 1 10*3/uL (ref 0.7–4.0)
MCH: 32.4 pg (ref 26.0–34.0)
MCHC: 31.4 g/dL (ref 30.0–36.0)
MCV: 103.1 fL — AB (ref 78.0–100.0)
MONO ABS: 0.6 10*3/uL (ref 0.1–1.0)
Monocytes Relative: 12 %
NEUTROS ABS: 3 10*3/uL (ref 1.7–7.7)
NEUTROS PCT: 64 %
Platelets: 93 10*3/uL — ABNORMAL LOW (ref 150–400)
RBC: 3.58 MIL/uL — AB (ref 3.87–5.11)
RDW: 16.2 % — AB (ref 11.5–15.5)
WBC: 4.6 10*3/uL (ref 4.0–10.5)

## 2016-01-31 LAB — I-STAT CHEM 8, ED
BUN: 78 mg/dL — AB (ref 6–20)
CHLORIDE: 97 mmol/L — AB (ref 101–111)
CREATININE: 1.6 mg/dL — AB (ref 0.44–1.00)
Calcium, Ion: 1.17 mmol/L (ref 1.15–1.40)
GLUCOSE: 93 mg/dL (ref 65–99)
HEMATOCRIT: 37 % (ref 36.0–46.0)
HEMOGLOBIN: 12.6 g/dL (ref 12.0–15.0)
POTASSIUM: 3.7 mmol/L (ref 3.5–5.1)
Sodium: 140 mmol/L (ref 135–145)
TCO2: 33 mmol/L (ref 0–100)

## 2016-01-31 LAB — URINALYSIS, ROUTINE W REFLEX MICROSCOPIC
Bilirubin Urine: NEGATIVE
GLUCOSE, UA: NEGATIVE mg/dL
Hgb urine dipstick: NEGATIVE
KETONES UR: NEGATIVE mg/dL
LEUKOCYTES UA: NEGATIVE
NITRITE: NEGATIVE
PH: 6 (ref 5.0–8.0)
Protein, ur: NEGATIVE mg/dL

## 2016-01-31 LAB — I-STAT TROPONIN, ED: Troponin i, poc: 0.02 ng/mL (ref 0.00–0.08)

## 2016-01-31 LAB — LIPASE, BLOOD: LIPASE: 19 U/L (ref 11–51)

## 2016-01-31 LAB — CBG MONITORING, ED: GLUCOSE-CAPILLARY: 81 mg/dL (ref 65–99)

## 2016-01-31 MED ORDER — SODIUM CHLORIDE 0.9 % IV SOLN: INTRAVENOUS | Status: DC

## 2016-01-31 MED ORDER — SODIUM CHLORIDE 0.9 % IV BOLUS (SEPSIS)
500.0000 mL | Freq: Once | INTRAVENOUS | Status: AC
  Administered 2016-01-31: 500 mL via INTRAVENOUS

## 2016-01-31 NOTE — ED Notes (Signed)
CBG 81.  
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
## 2016-01-31 NOTE — Telephone Encounter (Signed)
I called concerning the speech therapy, apparently the patient is complaining of problems swallowing, I was not aware of this. The patient went to the emergency room today. Okay for speech therapy to evaluate the patient.

## 2016-01-31 NOTE — Discharge Planning (Signed)
Pt up for discharge. [facility] reviewed chart for possible CM needs.  No needs identified or communicated. Pt being transported back to [facility] via [facility].

## 2016-01-31 NOTE — Telephone Encounter (Signed)
Victoria Fisher with [facility] is calling to get verbal orders for speech therapy for the patient for next week.

## 2016-01-31 NOTE — ED Triage Notes (Signed)
Pt brought in by EMS due having 2 falls this am. Pt a&ox4. Pt does have small hematoma on the back head on left side and some bruising on left shoulder. Pt has hx of a.fib, but not on blood thinners at this time.

## 2016-01-31 NOTE — Discharge Instructions (Signed)
Workup shows evidence of a left-sided rib fracture. No other acute findings. Continue your current medications. Follow-up with your record Dr. Return for any new or worse symptoms.

## 2016-01-31 NOTE — ED Notes (Signed)
[facility] contacted to transport patient back to [facility]

## 2016-01-31 NOTE — Patient Outreach (Signed)
Patient triggered red on emmi heart failure dashboard, notification sent to Erenest Rasher, RN

## 2016-01-31 NOTE — ED Notes (Signed)
Pt being transported back to [facility] via [facility].

## 2016-01-31 NOTE — Telephone Encounter (Signed)
Pt cancelled her appt that she had yesterday due to her husband being in the hospital. She is currently in the ER herself for evaluation after a fall. She was previously referred by our office to [facility] for PT.

## 2016-02-03 DIAGNOSIS — I482 Chronic atrial fibrillation: Secondary | ICD-10-CM | POA: Diagnosis not present

## 2016-02-03 DIAGNOSIS — I2510 Atherosclerotic heart disease of native coronary artery without angina pectoris: Secondary | ICD-10-CM | POA: Diagnosis not present

## 2016-02-03 DIAGNOSIS — I5033 Acute on chronic diastolic (congestive) heart failure: Secondary | ICD-10-CM | POA: Diagnosis not present

## 2016-02-03 DIAGNOSIS — I130 Hypertensive heart and chronic kidney disease with heart failure and stage 1 through stage 4 chronic kidney disease, or unspecified chronic kidney disease: Secondary | ICD-10-CM | POA: Diagnosis not present

## 2016-02-03 DIAGNOSIS — N184 Chronic kidney disease, stage 4 (severe): Secondary | ICD-10-CM | POA: Diagnosis not present

## 2016-02-03 DIAGNOSIS — Q820 Hereditary lymphedema: Secondary | ICD-10-CM | POA: Diagnosis not present

## 2016-02-04 ENCOUNTER — Other Ambulatory Visit: Payer: Self-pay | Admitting: Pharmacist

## 2016-02-04 ENCOUNTER — Encounter: Payer: Self-pay | Admitting: Neurology

## 2016-02-04 DIAGNOSIS — N184 Chronic kidney disease, stage 4 (severe): Secondary | ICD-10-CM | POA: Diagnosis not present

## 2016-02-04 DIAGNOSIS — Q820 Hereditary lymphedema: Secondary | ICD-10-CM | POA: Diagnosis not present

## 2016-02-04 DIAGNOSIS — I482 Chronic atrial fibrillation: Secondary | ICD-10-CM | POA: Diagnosis not present

## 2016-02-04 DIAGNOSIS — I5033 Acute on chronic diastolic (congestive) heart failure: Secondary | ICD-10-CM | POA: Diagnosis not present

## 2016-02-04 DIAGNOSIS — I130 Hypertensive heart and chronic kidney disease with heart failure and stage 1 through stage 4 chronic kidney disease, or unspecified chronic kidney disease: Secondary | ICD-10-CM | POA: Diagnosis not present

## 2016-02-04 DIAGNOSIS — I2510 Atherosclerotic heart disease of native coronary artery without angina pectoris: Secondary | ICD-10-CM | POA: Diagnosis not present

## 2016-02-04 NOTE — Patient Outreach (Signed)
[facility]) Care Management  02/04/2016  Victoria Fisher September 20, 1934 [id number]  Patient was referred to [facility] by [place] for medication education.   Successful phone outreach to patient, HIPAA details verified.  Patient states she used to get medication management from [name] but is no longer receiving this service.     During phone call it was apparent patient may benefit from [place] home visit.  Patient accepted offer for [place] home visit to review medications.   Plan:  Home visit scheduled with patient for the next month.   Karrie Meres, PharmD, [place] [phone]

## 2016-02-05 ENCOUNTER — Emergency Department (HOSPITAL_COMMUNITY)
Admission: EM | Admit: 2016-02-05 | Discharge: 2016-02-06 | Disposition: A | Discharge status: Home / Self Care | Payer: Medicare Other | Attending: Emergency Medicine | Admitting: Emergency Medicine

## 2016-02-05 ENCOUNTER — Telehealth: Payer: Self-pay | Admitting: Cardiovascular Disease

## 2016-02-05 ENCOUNTER — Emergency Department (HOSPITAL_COMMUNITY): Payer: Medicare Other

## 2016-02-05 ENCOUNTER — Other Ambulatory Visit: Payer: Self-pay

## 2016-02-05 ENCOUNTER — Encounter (HOSPITAL_COMMUNITY): Payer: Self-pay | Admitting: Vascular Surgery

## 2016-02-05 DIAGNOSIS — I130 Hypertensive heart and chronic kidney disease with heart failure and stage 1 through stage 4 chronic kidney disease, or unspecified chronic kidney disease: Secondary | ICD-10-CM | POA: Insufficient documentation

## 2016-02-05 DIAGNOSIS — N184 Chronic kidney disease, stage 4 (severe): Secondary | ICD-10-CM | POA: Insufficient documentation

## 2016-02-05 DIAGNOSIS — E039 Hypothyroidism, unspecified: Secondary | ICD-10-CM | POA: Insufficient documentation

## 2016-02-05 DIAGNOSIS — Z85038 Personal history of other malignant neoplasm of large intestine: Secondary | ICD-10-CM | POA: Insufficient documentation

## 2016-02-05 DIAGNOSIS — I252 Old myocardial infarction: Secondary | ICD-10-CM | POA: Diagnosis not present

## 2016-02-05 DIAGNOSIS — I9589 Other hypotension: Secondary | ICD-10-CM | POA: Diagnosis not present

## 2016-02-05 DIAGNOSIS — I2510 Atherosclerotic heart disease of native coronary artery without angina pectoris: Secondary | ICD-10-CM | POA: Diagnosis not present

## 2016-02-05 DIAGNOSIS — Z79899 Other long term (current) drug therapy: Secondary | ICD-10-CM | POA: Diagnosis not present

## 2016-02-05 DIAGNOSIS — I5033 Acute on chronic diastolic (congestive) heart failure: Secondary | ICD-10-CM | POA: Diagnosis not present

## 2016-02-05 DIAGNOSIS — N289 Disorder of kidney and ureter, unspecified: Secondary | ICD-10-CM | POA: Diagnosis not present

## 2016-02-05 DIAGNOSIS — D631 Anemia in chronic kidney disease: Secondary | ICD-10-CM | POA: Diagnosis not present

## 2016-02-05 DIAGNOSIS — J449 Chronic obstructive pulmonary disease, unspecified: Secondary | ICD-10-CM | POA: Insufficient documentation

## 2016-02-05 DIAGNOSIS — Z7982 Long term (current) use of aspirin: Secondary | ICD-10-CM | POA: Diagnosis not present

## 2016-02-05 DIAGNOSIS — R600 Localized edema: Secondary | ICD-10-CM | POA: Diagnosis not present

## 2016-02-05 DIAGNOSIS — Z91040 Latex allergy status: Secondary | ICD-10-CM | POA: Diagnosis not present

## 2016-02-05 DIAGNOSIS — R609 Edema, unspecified: Secondary | ICD-10-CM

## 2016-02-05 DIAGNOSIS — I5030 Unspecified diastolic (congestive) heart failure: Secondary | ICD-10-CM | POA: Diagnosis not present

## 2016-02-05 DIAGNOSIS — R0602 Shortness of breath: Secondary | ICD-10-CM | POA: Diagnosis not present

## 2016-02-05 DIAGNOSIS — N2581 Secondary hyperparathyroidism of renal origin: Secondary | ICD-10-CM | POA: Diagnosis not present

## 2016-02-05 DIAGNOSIS — M7989 Other specified soft tissue disorders: Secondary | ICD-10-CM | POA: Diagnosis present

## 2016-02-05 LAB — I-STAT TROPONIN, ED: Troponin i, poc: 0.02 ng/mL (ref 0.00–0.08)

## 2016-02-05 LAB — BASIC METABOLIC PANEL
Anion gap: 9 (ref 5–15)
BUN: 65 mg/dL — ABNORMAL HIGH (ref 6–20)
CO2: 32 mmol/L (ref 22–32)
Calcium: 9.7 mg/dL (ref 8.9–10.3)
Chloride: 96 mmol/L — ABNORMAL LOW (ref 101–111)
Creatinine, Ser: 1.97 mg/dL — ABNORMAL HIGH (ref 0.44–1.00)
GFR calc Af Amer: 27 mL/min — ABNORMAL LOW (ref >60)
GFR, EST NON AFRICAN AMERICAN: 23 mL/min — AB (ref >60)
Glucose, Bld: 104 mg/dL — ABNORMAL HIGH (ref 65–99)
POTASSIUM: 3.8 mmol/L (ref 3.5–5.1)
SODIUM: 137 mmol/L (ref 135–145)

## 2016-02-05 LAB — CBC
HEMATOCRIT: 37.3 % (ref 36.0–46.0)
Hemoglobin: 11.6 g/dL — ABNORMAL LOW (ref 12.0–15.0)
MCH: 32.7 pg (ref 26.0–34.0)
MCHC: 31.1 g/dL (ref 30.0–36.0)
MCV: 105.1 fL — ABNORMAL HIGH (ref 78.0–100.0)
Platelets: 109 10*3/uL — ABNORMAL LOW (ref 150–400)
RBC: 3.55 MIL/uL — ABNORMAL LOW (ref 3.87–5.11)
RDW: 16.4 % — AB (ref 11.5–15.5)
WBC: 4.5 10*3/uL (ref 4.0–10.5)

## 2016-02-05 MED ORDER — MORPHINE SULFATE 15 MG PO TABS
15.0000 mg | ORAL_TABLET | Freq: Once | ORAL | Status: AC
  Administered 2016-02-05: 15 mg via ORAL
  Filled 2016-02-05: qty 1

## 2016-02-05 MED ORDER — FUROSEMIDE 10 MG/ML IJ SOLN
60.0000 mg | Freq: Once | INTRAMUSCULAR | Status: AC
  Administered 2016-02-05: 60 mg via INTRAVENOUS
  Filled 2016-02-05: qty 6

## 2016-02-05 NOTE — Telephone Encounter (Signed)
Returned call to patient. She states her kidney MD Dr. Buelah Manis wants her to go to the hospital. She states she is retaining fluid and she was advised she needed to be admitted for diuresis. She has plans to go to ED (advised to go to [facility]) but she is just waiting on a ride (her caregiver answered the phone, so hopefully she can take her).   Message routed to MD as Juluis Rainier

## 2016-02-05 NOTE — Telephone Encounter (Signed)
Patient saw her provider at [facility] this morning.  They  wanted her to call us to let us know that she is ready to go to the hospital.  Please call patient back regarding this.  She said that she usually goes in through the ER
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
## 2016-02-05 NOTE — Patient Outreach (Signed)
   Unsuccessful attempt made to contact patient via telephone tofollow up with [place]. HIPPA compliant message left for patient with this RNCM's contact information.

## 2016-02-05 NOTE — ED Triage Notes (Signed)
Pt reports to the ED for eval of BLE swelling, SOB, and increased falls. Pt was recently admitted for CHF exacerbation and had fluid taken off however after she was d/c home she reports the fluid retention came back. She also reports she has had frequent falls and she has been evaluated for the recent fall and was d/c home. Pt is not on blood thinners. Pt denies any CP.

## 2016-02-06 DIAGNOSIS — I130 Hypertensive heart and chronic kidney disease with heart failure and stage 1 through stage 4 chronic kidney disease, or unspecified chronic kidney disease: Secondary | ICD-10-CM | POA: Diagnosis not present

## 2016-02-06 DIAGNOSIS — N184 Chronic kidney disease, stage 4 (severe): Secondary | ICD-10-CM | POA: Diagnosis not present

## 2016-02-06 DIAGNOSIS — I2510 Atherosclerotic heart disease of native coronary artery without angina pectoris: Secondary | ICD-10-CM | POA: Diagnosis not present

## 2016-02-06 DIAGNOSIS — I5033 Acute on chronic diastolic (congestive) heart failure: Secondary | ICD-10-CM | POA: Diagnosis not present

## 2016-02-06 DIAGNOSIS — Q820 Hereditary lymphedema: Secondary | ICD-10-CM | POA: Diagnosis not present

## 2016-02-06 DIAGNOSIS — I482 Chronic atrial fibrillation: Secondary | ICD-10-CM | POA: Diagnosis not present

## 2016-02-07 ENCOUNTER — Encounter: Payer: Self-pay | Admitting: *Deleted

## 2016-02-07 ENCOUNTER — Encounter: Payer: Self-pay | Admitting: Physician Assistant

## 2016-02-07 ENCOUNTER — Other Ambulatory Visit: Payer: Self-pay

## 2016-02-07 DIAGNOSIS — I2510 Atherosclerotic heart disease of native coronary artery without angina pectoris: Secondary | ICD-10-CM | POA: Diagnosis not present

## 2016-02-07 DIAGNOSIS — I130 Hypertensive heart and chronic kidney disease with heart failure and stage 1 through stage 4 chronic kidney disease, or unspecified chronic kidney disease: Secondary | ICD-10-CM | POA: Diagnosis not present

## 2016-02-07 DIAGNOSIS — I5033 Acute on chronic diastolic (congestive) heart failure: Secondary | ICD-10-CM | POA: Diagnosis not present

## 2016-02-07 DIAGNOSIS — N184 Chronic kidney disease, stage 4 (severe): Secondary | ICD-10-CM | POA: Diagnosis not present

## 2016-02-07 DIAGNOSIS — I482 Chronic atrial fibrillation: Secondary | ICD-10-CM | POA: Diagnosis not present

## 2016-02-07 DIAGNOSIS — Q820 Hereditary lymphedema: Secondary | ICD-10-CM | POA: Diagnosis not present

## 2016-02-07 NOTE — Patient Outreach (Signed)
[facility]) Care Management  02/07/2016   CODEE BLOODWORTH 03/04/1934 [id number]  Subjective:  I went to the hospital the other night, I was suppose to be admitted but they sent me home at 11 pm. I do need help with my medicines.   Objective:  Telephonic encounter.  Current Medications:  Current Outpatient Prescriptions  Medication Sig Dispense Refill  . aspirin EC 81 MG tablet Take 81 mg by mouth daily.     [name] atorvastatin (LIPITOR) 40 MG tablet Take 40 mg by mouth daily after supper.     . calcitRIOL (ROCALTROL) 0.25 MCG capsule Take 1 capsule (0.25 mcg total) by mouth daily. 30 capsule 1  . Cholecalciferol (VITAMIN D) 2000 units CAPS Take 2,000 Units by mouth daily.    . Darbepoetin Alfa (ARANESP) 100 MCG/0.5ML SOSY injection Inject 0.5 mLs (100 mcg total) into the skin every Friday at 6 PM. 4.2 mL 0  . ferrous sulfate 325 (65 FE) MG tablet Take 325 mg by mouth daily after supper.     . levalbuterol (XOPENEX HFA) 45 MCG/ACT inhaler Inhale 1 puff into the lungs every 4 (four) hours as needed for wheezing or shortness of breath.     . levothyroxine (SYNTHROID, LEVOTHROID) 112 MCG tablet Take 1 tablet (112 mcg total) by mouth every morning. 30 tablet 6  . magnesium oxide (MAG-OX) 400 (241.3 MG) MG tablet Take 1 tablet (400 mg total) by mouth daily. 30 tablet 5  . metolazone (ZAROXOLYN) 5 MG tablet Take 5 mg (1 tablet) once daily on Monday Wednesday and Friday only 45 tablet 3  . metoprolol succinate (TOPROL-XL) 50 MG 24 hr tablet Take 50 mg by mouth daily. Take with or immediately following a meal.    . morphine (MS CONTIN) 30 MG 12 hr tablet Take 1 tablet (30 mg total) by mouth 2 (two) times daily. 8 tablet 0  . morphine (MSIR) 15 MG tablet Take 1 tablet (15 mg total) by mouth 3 (three) times daily as needed (breakthrough pain). 8 tablet 0  . nitroGLYCERIN (NITROSTAT) 0.4 MG SL tablet Place 1 tablet (0.4 mg total) under the tongue every 5 (five) minutes as needed for  chest pain. 100 tablet 1  . polyethylene glycol (MIRALAX / GLYCOLAX) packet Take 17 g by mouth 2 (two) times daily as needed (constipation). Mix in 8 oz liquid and drink    . potassium chloride SA (KLOR-CON M20) 20 MEQ tablet Take 3 tablets (60 mEq total) by mouth 2 (two) times daily. Take an EXTRA 20 meq (1 tablet) on Monday Wednesday and Fridays with metolazone. 90 tablet 3  . senna (SENOKOT) 8.6 MG TABS tablet Take 2-3 tablets by mouth daily as needed for mild constipation.     . sertraline (ZOLOFT) 100 MG tablet Take 100 mg by mouth daily.    [name] torsemide (DEMADEX) 100 MG tablet Take 1 tablet (100 mg total) by mouth 2 (two) times daily. 60 tablet 11  . vitamin E 400 UNIT capsule Take 400 Units by mouth daily.     No current facility-administered medications for this visit.     Functional Status:  In your present state of health, do you have any difficulty performing the following activities: 01/31/2016 01/27/2016  Hearing? - N  Vision? - Y  Difficulty concentrating or making decisions? - N  Walking or climbing stairs? - Y  Dressing or bathing? - Y  Doing errands, shopping? N -  Preparing Food and eating ? - -  Using the Toilet? - -  In the past six months, have you accidently leaked urine? - -  Do you have problems with loss of bowel control? - -  Managing your Medications? - -  Managing your Finances? - -  Some recent data might be hidden    Fall/Depression Screening: PHQ 2/9 Scores 01/20/2016 01/10/2016  PHQ - 2 Score 0 0  Some recent data might be hidden   THN CM Care Plan Problem One   Flowsheet Row Most Recent Value  Care Plan Problem One  Patient had recent hospitalization  Role Documenting the Problem One  Care Management [place] for Problem One  Active  THN Long Term Goal (31-90 days)  Patient will have no heart failure related acute care admission in the next 31 days.  THN Long Term Goal Start Date  01/10/16  Interventions for Problem One Long Term  Goal  Patient had an emergency room visit this week for fluid retention, was not admitted.  THN CM Short Term Goal #1 (0-30 days)  In the next 21 days, patient will meet with [facility] RNCM's for heart failure educaiton  THN CM Short Term Goal #1 Start Date  01/10/16  [facility] CM Short Term Goal #1 Met Date  01/20/16  Interventions for Short Term Goal #1  initial home visit    [facility] CM Care Plan Problem Two   Flowsheet Row Most Recent Value  Care Plan Problem Two  Pateint has medication confusion  Role Documenting the Problem Two  Care Management [place] for Problem Two  Active  THN CM Short Term Goal #1 (0-30 days)  In the next 21 days, patient will meet  with a [facility] Pharmacist for medication education reconciliation  THN CM Short Term Goal #1 Start Date  02/07/16  Interventions for Short Term Goal #2   Telephone conact with patient who was advised [facility] pharmacist would be contacting her for medication education, reconciliation     Assessment:  Patient verbalizes increase edema of lower extremities. Patient states her kidney doctor wanted her to be in the hospital, states she needed to get some of the fluid off of her.   Patient advised to call 911 if she has shortness of breath and or chest pain as it may indicate worsening heart failure. Patient has appointment with her cardiologist on February 11, 2016  Plan:  Home visit within the next 2 weeks for community care coordination

## 2016-02-07 NOTE — Progress Notes (Deleted)
Cardiology Office Note    Date:  02/07/2016   ID:  Victoria Fisher, DOB 09/01/1934, MRN [id number]  PCP:  Haywood Pao, MD  Cardiologist:  Dr. Sallyanne Kuster  No chief complaint on file.   History of Present Illness:  Victoria Fisher is a 81 y.o. female with PMH of permanent atrial fibrillation not on systemic anticoagulation due to fall risk and bleeding, diastolic heart failure, mild coronary artery disease, COPD, and CKD stage III as well as leg lymphedema. She was previously admitted in November 2017 for bilateral lower extremity edema. She was diuresed a total of 5 kg and discharged at 151 pounds. Unfortunately by the time she followed up in the office on 01/16/2016, her weight has already backed up to 164 pounds. 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 She also has moderate to severe tricuspid regurgitation. Last echocardiogram obtained on 12/08/2015 showed EF 65-70%, severely dilated left atrium, severely dilated right ventricle with normal RVEF, severe TR, PA peak pressure 34 mmHg.   According to Dr. Victorino December last office note, he is quite concerned that patient is no longer able to take care of herself and she needs at least an assisted-living his significant independent living where she is living with her husband now. Was referred to the heart failure clinic. He wanted to admit her to the hospital for IV diuresis, however patient did not want to leave her husband home. It appears she has missed her appointment with the heart failure clinic on 01/23/2016. She was seen in the ED on 01/31/2016 for fall 2, CT head and neck was negative for acute process, x-ray of both hip and pelvis also shows no acute injury. The workup did find a left fourth rib fracture. Urinalysis was negative.  Patient was discharged back to home. She went to [facility] on 02/05/2016 at the advise of her nephrologist due to significant volume overload. She was given one dose of IV Lasix and was discharged home for outpatient management. Her weight on the ED visit was 165 pounds.  No EKG   Past Medical History:  Diagnosis Date  . Abnormality of gait 01/30/2015  . Anemia in CKD (chronic kidney disease)   . Anxiety   . Arthritis   . Asthma    On nebulizer  . Atrial fibrillation, chronic ([place])    a. Rate controlled w/ BB. No OAC 2/2 falls and gait instability.  . Bowel obstruction    Caused by scar tissue  . Chronic cor pulmonale (HCC)    Most likely due to COPD (cannot rule out contribution of diastolic LV dysfunction). ECHO 07/30/11 pulmonary artery pressure was estimated to be 50-60% mmHg & there was moderate tricuspid insufficiency.  . Chronic diastolic CHF (congestive heart failure) ([place])    a. Predominantly right heart failure due to cor pulmonale & in turn this is most likely due to untreated OSA;  b. 05/2013 Echo: EF 55-60%, no rwma, mild to mod MR, sev dil LA, mod to sev dil RV w/ reduced systolic fxn, massively dil RA, mod-sev TR, PASP 78mmHg.  [name] Chronic pain syndrome   . Colon cancer ([place])    Carcinoma of sigmoid colon 1984 & 1985.  [name] COPD (chronic obstructive pulmonary disease) ([place])   . Coronary artery disease    a. occluded proximal diagonal  artery on angiography 2004  . Depression   . Diverticulosis of colon   . 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 Dyspnea on exertion    Reluctant to take diuretics  . Edema of both legs    Venous Doppler 05/11/12 was normal & no evidence of thrombus or thrombophlebitis. Chronic LE edema related to both cor pulmonale & lymphedema, hyperlipidemia & HTN involving coronary atherosclerosis by cardiac cath in 2004. Cath showed a tiny old occlusion of the optional diagonal branch & also proximal LAD.  [name] Frequent falls    Possibly due to  orthostatic hypotension but we have not seen that on our visits. Recent fall 05/2012 with scalp hematoma, swollen left LE, & black eyes.  . Hyperlipidemia    On a statin. Catheterization 02/12/03 Showed a tiny old occlusion of the optional diagonal branch & also proximal LAD.  [name] Hypertension   . Hypothyroidism 2002   On hormone replacement therapy  . IBS (irritable bowel syndrome)   . Insomnia   . Kidney failure 09/05/2015   Pt states that she went to the doctor on thursday and was told she is in stage 4 kidney failure  . Lung nodule   . Milroy's disease    Lymphatic disorder [facility] at [facility]  . Obstructive sleep apnea    Polysomnogram 09/25/11 AHI: 53.6/hr overall & 57.1/hr during REM sleep. Poor compliance previously with CPAP  . Pulmonary hypertension    Pullmonary hypertension, chronic cor pulmonale w/pulmonary artery pressures of 50&#x2011; 60 mmHg.  . Tuberculosis     Past Surgical History:  Procedure Laterality Date  . ABDOMINAL HYSTERECTOMY  1983  . APPENDECTOMY  1956  . CARDIAC CATHETERIZATION  02/12/03   Showed a tiny old occlusion of the optional diagonal branch & also proximal LAD.  [name] CHOLECYSTECTOMY  1984  . COLON BIOPSY    . COLON RESECTION     2 times  . COLONOSCOPY  07/23/10    Current Medications: Outpatient Medications Prior to Visit  Medication Sig Dispense Refill  . aspirin EC 81 MG tablet Take 81 mg by mouth daily.     [name] atorvastatin (LIPITOR) 40 MG tablet Take 40 mg by mouth daily after supper.     . calcitRIOL (ROCALTROL) 0.25 MCG capsule Take 1 capsule (0.25 mcg total) by mouth daily. 30 capsule 1  . Cholecalciferol (VITAMIN D) 2000 units CAPS Take 2,000 Units by mouth daily.    . Darbepoetin Alfa (ARANESP) 100 MCG/0.5ML SOSY injection Inject 0.5 mLs (100 mcg total) into the skin every Friday at 6 PM. 4.2 mL 0  . ferrous sulfate 325 (65 FE) MG tablet Take 325 mg by mouth daily after supper.     . levalbuterol (XOPENEX HFA) 45 MCG/ACT inhaler Inhale 1  puff into the lungs every 4 (four) hours as needed for wheezing or shortness of breath.     . levothyroxine (SYNTHROID, LEVOTHROID) 112 MCG tablet Take 1 tablet (112 mcg total) by mouth every morning. 30 tablet 6  . magnesium oxide (MAG-OX) 400 (241.3 MG) MG tablet Take 1 tablet (400 mg total) by mouth daily. 30 tablet 5  . metolazone (ZAROXOLYN) 5 MG tablet Take 5 mg (1 tablet) once daily on Monday Wednesday and Friday only 45 tablet 3  . metoprolol succinate (TOPROL-XL) 50 MG 24 hr tablet Take 50 mg by mouth daily. Take with or immediately following a meal.    . morphine (MS CONTIN) 30 MG 12 hr tablet Take 1 tablet (30 mg total) by  mouth 2 (two) times daily. 8 tablet 0  . morphine (MSIR) 15 MG tablet Take 1 tablet (15 mg total) by mouth 3 (three) times daily as needed (breakthrough pain). 8 tablet 0  . nitroGLYCERIN (NITROSTAT) 0.4 MG SL tablet Place 1 tablet (0.4 mg total) under the tongue every 5 (five) minutes as needed for chest pain. 100 tablet 1  . polyethylene glycol (MIRALAX / GLYCOLAX) packet Take 17 g by mouth 2 (two) times daily as needed (constipation). Mix in 8 oz liquid and drink    . potassium chloride SA (KLOR-CON M20) 20 MEQ tablet Take 3 tablets (60 mEq total) by mouth 2 (two) times daily. Take an EXTRA 20 meq (1 tablet) on Monday Wednesday and Fridays with metolazone. 90 tablet 3  . senna (SENOKOT) 8.6 MG TABS tablet Take 2-3 tablets by mouth daily as needed for mild constipation.     . sertraline (ZOLOFT) 100 MG tablet Take 100 mg by mouth daily.    [name] torsemide (DEMADEX) 100 MG tablet Take 1 tablet (100 mg total) by mouth 2 (two) times daily. 60 tablet 11  . vitamin E 400 UNIT capsule Take 400 Units by mouth daily.     No facility-administered medications prior to visit.      Allergies:   Codeine; Gabapentin; Other; Ciprofloxacin; Latex; Penicillins; and Sulfonamide derivatives   Social History   Social History  . Marital status: Married    Spouse name: Georgiann Mccoy  .  Number of children: 3  . Years of education: HS   Occupational History  .  Retired   Social History Main Topics  . Smoking status: Never Smoker  . Smokeless tobacco: Never Used  . Alcohol use No  . Drug use: No  . Sexual activity: No   Other Topics Concern  . Not on file   Social History Narrative   Patient is married Georgiann Mccoy) and lives at home with her husband.   Patient is retired.   Patient has three children and her husband has two children.   Patient is right-handed.   Patient drinks very little caffeine (1/2 cup daily)   Patient has a high school education.     Family History:  The patient's ***family history includes Alzheimer's disease in her mother; Diabetes in her brother; Heart disease in her father; Parkinsonism in her brother.   ROS:   Please see the history of present illness.    ROS All other systems reviewed and are negative.   PHYSICAL EXAM:   VS:  There were no vitals taken for this visit.   GEN: Well nourished, well developed, in no acute distress  HEENT: normal  Neck: no JVD, carotid bruits, or masses Cardiac: ***RRR; no murmurs, rubs, or gallops,no edema  Respiratory:  clear to auscultation bilaterally, normal work of breathing GI: soft, nontender, nondistended, + BS MS: no deformity or atrophy  Skin: warm and dry, no rash Neuro:  Alert and Oriented x 3, Strength and sensation are intact Psych: euthymic mood, full affect  Wt Readings from Last 3 Encounters:  02/05/16 165 lb 8 oz (75.1 kg)  01/31/16 164 lb (74.4 kg)  01/16/16 164 lb (74.4 kg)      Studies/Labs Reviewed:   EKG:  EKG is*** ordered today.  The ekg ordered today demonstrates ***  Recent Labs: 12/18/2015: Magnesium 2.5 01/05/2016: B Natriuretic Peptide 468.0 01/06/2016: TSH 20.459 01/31/2016: ALT 11 02/05/2016: BUN 65; Creatinine, Ser 1.97; Hemoglobin 11.6; Platelets 109; Potassium 3.8; Sodium 137   Lipid Panel  Component Value Date/Time   CHOL 59 09/10/2015 0403    TRIG 22 09/10/2015 0403   HDL 32 (L) 09/10/2015 0403   CHOLHDL 1.8 09/10/2015 0403   VLDL 4 09/10/2015 0403   LDLCALC 23 09/10/2015 0403    Additional studies/ records that were reviewed today include:   Echo 12/09/2015 LV EF: 65% -   70%  - Left ventricle: The cavity size was normal. Systolic function was   vigorous. The estimated ejection fraction was in the range of 65%   to 70%. Wall motion was normal; there were no regional wall   motion abnormalities. - Ventricular septum: The contour showed diastolic flattening   consistent with volume overload. - Aortic valve: Transvalvular velocity was within the normal range.   There was no stenosis. There was no regurgitation. - Mitral valve: Transvalvular velocity was within the normal range.   There was no evidence for stenosis. There was mild regurgitation. - Left atrium: The atrium was severely dilated. - Right ventricle: The cavity size was severely dilated. Wall   thickness was normal. Systolic function was normal. - Right atrium: The atrium was massively dilated. - Atrial septum: No defect or patent foramen ovale was identified   by color flow Doppler. - Tricuspid valve: There was malcoaptation of the valve leaflets.   There was severe regurgitation. - Pulmonary arteries: Systolic pressure was within the normal   range. PA peak pressure: 34 mm Hg (S).   ASSESSMENT:    No diagnosis found.   PLAN:  In order of problems listed above:  1. ***    Medication Adjustments/Labs and Tests Ordered: Current medicines are reviewed at length with the patient today.  Concerns regarding medicines are outlined above.  Medication changes, Labs and Tests ordered today are listed in the Patient Instructions below. There are no Patient Instructions on file for this visit.   Hilbert Corrigan, [place]  02/07/2016 6:30 AM    [address], [place], [place]  [address] Phone: [phone]; Fax: [phone]

## 2016-02-08 NOTE — Progress Notes (Signed)
This encounter was created in error - please disregard.

## 2016-02-10 ENCOUNTER — Other Ambulatory Visit (HOSPITAL_COMMUNITY): Payer: Self-pay | Admitting: Student

## 2016-02-11 ENCOUNTER — Other Ambulatory Visit: Payer: Self-pay

## 2016-02-11 ENCOUNTER — Telehealth: Payer: Self-pay | Admitting: Cardiovascular Disease

## 2016-02-11 DIAGNOSIS — I130 Hypertensive heart and chronic kidney disease with heart failure and stage 1 through stage 4 chronic kidney disease, or unspecified chronic kidney disease: Secondary | ICD-10-CM | POA: Diagnosis not present

## 2016-02-11 DIAGNOSIS — I482 Chronic atrial fibrillation: Secondary | ICD-10-CM | POA: Diagnosis not present

## 2016-02-11 DIAGNOSIS — I2510 Atherosclerotic heart disease of native coronary artery without angina pectoris: Secondary | ICD-10-CM | POA: Diagnosis not present

## 2016-02-11 DIAGNOSIS — Q820 Hereditary lymphedema: Secondary | ICD-10-CM | POA: Diagnosis not present

## 2016-02-11 DIAGNOSIS — N184 Chronic kidney disease, stage 4 (severe): Secondary | ICD-10-CM | POA: Diagnosis not present

## 2016-02-11 DIAGNOSIS — I5033 Acute on chronic diastolic (congestive) heart failure: Secondary | ICD-10-CM | POA: Diagnosis not present

## 2016-02-11 NOTE — Telephone Encounter (Signed)
New message       Pt is refusing to let nurse wrap her legs.  Calling to get order stopped.

## 2016-02-11 NOTE — Patient Outreach (Signed)
    Unsuccessful attempt made to contact patient via telephone to follow up with EMMI Red Alert. HIPPA compliant message left for patient .  Plan: Make another attempt to follow up with patient for EMMI Red Alerts and for community care coordination.

## 2016-02-12 ENCOUNTER — Other Ambulatory Visit: Payer: Self-pay

## 2016-02-12 ENCOUNTER — Other Ambulatory Visit: Payer: Self-pay | Admitting: Pharmacist

## 2016-02-12 ENCOUNTER — Ambulatory Visit: Payer: Self-pay | Admitting: Pharmacist

## 2016-02-12 NOTE — Telephone Encounter (Signed)
Spoke w/HHRN, she states pt is refused to have her legs wrapped and therefore there is not skilled need the pt has to require HHRN anymore and pt will be discharged, however PT will still continue to work w/pt.  She states she has explained this all to pt and she is agreeable.

## 2016-02-12 NOTE — Telephone Encounter (Signed)
Routing to CHF clinic and Dr. Aundra Dubin.  Note from 10/24 states legs are to be wrapped.

## 2016-02-12 NOTE — Patient Outreach (Signed)
[facility]) Care Management  02/12/2016  Victoria Fisher 28-Aug-1934 [id number]  Phone call to patient this morning to confirm home visit per patient request.  Caregiver answered phone, and stated she was at patient's residence.  Patient could be heard in background stating she did not wish for [facility] Pharmacist home visit today so visit was cancelled.    Received phone call from [place], whom went to patient's residence, and states patient is now interested in home visit again.   Plan:  Will attempt to reach patient to reschedule home visit.   Karrie Meres, PharmD, [place] [phone]

## 2016-02-13 DIAGNOSIS — I2510 Atherosclerotic heart disease of native coronary artery without angina pectoris: Secondary | ICD-10-CM | POA: Diagnosis not present

## 2016-02-13 DIAGNOSIS — I482 Chronic atrial fibrillation: Secondary | ICD-10-CM | POA: Diagnosis not present

## 2016-02-13 DIAGNOSIS — N184 Chronic kidney disease, stage 4 (severe): Secondary | ICD-10-CM | POA: Diagnosis not present

## 2016-02-13 DIAGNOSIS — Q820 Hereditary lymphedema: Secondary | ICD-10-CM | POA: Diagnosis not present

## 2016-02-13 DIAGNOSIS — I130 Hypertensive heart and chronic kidney disease with heart failure and stage 1 through stage 4 chronic kidney disease, or unspecified chronic kidney disease: Secondary | ICD-10-CM | POA: Diagnosis not present

## 2016-02-13 DIAGNOSIS — I5033 Acute on chronic diastolic (congestive) heart failure: Secondary | ICD-10-CM | POA: Diagnosis not present

## 2016-02-13 NOTE — Patient Outreach (Signed)
[facility]) Care Management  02/13/2016   JENAI SCALETTA 1934-12-01 [id number]  Subjective:  I am doing okay, I just thought I had an appointment today but I did not.  Objective:  Elderly lady with bilateral edema of lower extremities, left periorbital hematoma.  Current Medications:  Current Outpatient Prescriptions  Medication Sig Dispense Refill  . aspirin EC 81 MG tablet Take 81 mg by mouth daily.     [name] atorvastatin (LIPITOR) 40 MG tablet Take 40 mg by mouth daily after supper.     . calcitRIOL (ROCALTROL) 0.25 MCG capsule Take 1 capsule (0.25 mcg total) by mouth daily. 30 capsule 1  . Cholecalciferol (VITAMIN D) 2000 units CAPS Take 2,000 Units by mouth daily.    . Darbepoetin Alfa (ARANESP) 100 MCG/0.5ML SOSY injection Inject 0.5 mLs (100 mcg total) into the skin every Friday at 6 PM. 4.2 mL 0  . ferrous sulfate 325 (65 FE) MG tablet Take 325 mg by mouth daily after supper.     . levalbuterol (XOPENEX HFA) 45 MCG/ACT inhaler Inhale 1 puff into the lungs every 4 (four) hours as needed for wheezing or shortness of breath.     . levothyroxine (SYNTHROID, LEVOTHROID) 112 MCG tablet Take 1 tablet (112 mcg total) by mouth every morning. 30 tablet 6  . magnesium oxide (MAG-OX) 400 (241.3 MG) MG tablet Take 1 tablet (400 mg total) by mouth daily. 30 tablet 5  . metolazone (ZAROXOLYN) 5 MG tablet Take 5 mg (1 tablet) once daily on Monday Wednesday and Friday only 45 tablet 3  . metoprolol succinate (TOPROL-XL) 50 MG 24 hr tablet Take 50 mg by mouth daily. Take with or immediately following a meal.    . morphine (MS CONTIN) 30 MG 12 hr tablet Take 1 tablet (30 mg total) by mouth 2 (two) times daily. 8 tablet 0  . morphine (MSIR) 15 MG tablet Take 1 tablet (15 mg total) by mouth 3 (three) times daily as needed (breakthrough pain). 8 tablet 0  . nitroGLYCERIN (NITROSTAT) 0.4 MG SL tablet Place 1 tablet (0.4 mg total) under the tongue every 5 (five) minutes as needed for  chest pain. 100 tablet 1  . polyethylene glycol (MIRALAX / GLYCOLAX) packet Take 17 g by mouth 2 (two) times daily as needed (constipation). Mix in 8 oz liquid and drink    . potassium chloride SA (K-DUR,KLOR-CON) 20 MEQ tablet TAKE 3 TABLETS BY MOUTH TWICE DAILY. TAKE 1 TABLET EXTRA ON MONDAY, WEDNESDAY, AND FRIDAYS WITH METOLAZONE. 270 tablet 3  . senna (SENOKOT) 8.6 MG TABS tablet Take 2-3 tablets by mouth daily as needed for mild constipation.     . sertraline (ZOLOFT) 100 MG tablet Take 100 mg by mouth daily.    [name] torsemide (DEMADEX) 100 MG tablet Take 1 tablet (100 mg total) by mouth 2 (two) times daily. 60 tablet 11  . vitamin E 400 UNIT capsule Take 400 Units by mouth daily.     No current facility-administered medications for this visit.     Functional Status:  In your present state of health, do you have any difficulty performing the following activities: 01/31/2016 01/27/2016  Hearing? - N  Vision? - Y  Difficulty concentrating or making decisions? - N  Walking or climbing stairs? - Y  Dressing or bathing? - Y  Doing errands, shopping? N -  Preparing Food and eating ? - -  Using the Toilet? - -  In the past six months, have you accidently  leaked urine? - -  Do you have problems with loss of bowel control? - -  Managing your Medications? - -  Managing your Finances? - -  Some recent data might be hidden    Fall/Depression Screening: PHQ 2/9 Scores 01/20/2016 01/10/2016  PHQ - 2 Score 0 0  Some recent data might be hidden   THN CM Care Plan Problem One   Flowsheet Row Most Recent Value  Care Plan Problem One  Patient had recent hospitalization  Role Documenting the Problem One  Care Management [place] for Problem One  Active  THN Long Term Goal (31-90 days)  Patient will have no heart failure related acute care admission in the next 31 days.  THN Long Term Goal Start Date  01/10/16  Interventions for Problem One Long Term Goal  Patient had an emergency  room visit this week for fluid retention, was not admitted.  THN CM Short Term Goal #1 (0-30 days)  In the next 21 days, patient will meet with [facility] RNCM's for heart failure educaiton  THN CM Short Term Goal #1 Start Date  01/10/16  [facility] CM Short Term Goal #1 Met Date  01/20/16  Interventions for Short Term Goal #1  initial home visit    [facility] CM Care Plan Problem Two   Flowsheet Row Most Recent Value  Care Plan Problem Two  Pateint has medication confusion  Role Documenting the Problem Two  Care Management Coordinator  Care Plan for Problem Two  Active  THN CM Short Term Goal #1 (0-30 days)  In the next 21 days, patient will meet  with a [facility] Pharmacist for medication education reconciliation  THN CM Short Term Goal #1 Start Date  02/07/16  Interventions for Short Term Goal #2   Telephone conact with patient who was advised [facility] pharmacist would be contacting her for medication education, reconciliation      Assessment:  Patient's husband and caregiver present. Patient has left black eye, states she fell.  Patient denies need for medical attention. Patient education, again, on fall prevention/home safety RNCM discussed with patient the need for her and her husband to move to a husband need for a high level of care due to medication confusion, falls.  Patient admits she has taken her husbands medications by mistake. Patient refuses disucssion on the need to move Private pay caregiver states she recently found nearly discarded 200 pills as she went through her belongs while cleaning up. RNCM stopped by the [employer], spoke with Mrs. Carmelina Noun.   Mrs. Twigg states Dealer and staff have discussed their concerns related to the safety of the couple, however, Mrs. Maurine Minister states patient has an emergency alert button she uses quite frequently.   Patient cancelled her appointment with [facility] Pharmacist today stating she thought she had an appointment with her cardiologist today.   Patient states her son could not attend the appointment today as he did not come into town for meeting Patient is being followed by [facility] for HHPT Plan:   Telephone contact next week with patient to assess her need for community care coordination

## 2016-02-14 DIAGNOSIS — I5033 Acute on chronic diastolic (congestive) heart failure: Secondary | ICD-10-CM | POA: Diagnosis not present

## 2016-02-14 DIAGNOSIS — I482 Chronic atrial fibrillation: Secondary | ICD-10-CM | POA: Diagnosis not present

## 2016-02-14 DIAGNOSIS — N184 Chronic kidney disease, stage 4 (severe): Secondary | ICD-10-CM | POA: Diagnosis not present

## 2016-02-14 DIAGNOSIS — I130 Hypertensive heart and chronic kidney disease with heart failure and stage 1 through stage 4 chronic kidney disease, or unspecified chronic kidney disease: Secondary | ICD-10-CM | POA: Diagnosis not present

## 2016-02-14 DIAGNOSIS — I2510 Atherosclerotic heart disease of native coronary artery without angina pectoris: Secondary | ICD-10-CM | POA: Diagnosis not present

## 2016-02-14 DIAGNOSIS — Q820 Hereditary lymphedema: Secondary | ICD-10-CM | POA: Diagnosis not present

## 2016-02-18 ENCOUNTER — Telehealth: Payer: Self-pay | Admitting: Cardiovascular Disease

## 2016-02-18 ENCOUNTER — Encounter (HOSPITAL_COMMUNITY): Payer: Self-pay

## 2016-02-18 ENCOUNTER — Emergency Department (HOSPITAL_COMMUNITY): Payer: Medicare Other

## 2016-02-18 ENCOUNTER — Inpatient Hospital Stay (HOSPITAL_COMMUNITY)
Admission: EM | Admit: 2016-02-18 | Discharge: 2016-03-06 | DRG: 193 | Disposition: A | Discharge status: Skilled Nursing Facility | Payer: Medicare Other | Attending: Internal Medicine | Admitting: Internal Medicine

## 2016-02-18 DIAGNOSIS — F1110 Opioid abuse, uncomplicated: Secondary | ICD-10-CM | POA: Diagnosis present

## 2016-02-18 DIAGNOSIS — R0603 Acute respiratory distress: Secondary | ICD-10-CM

## 2016-02-18 DIAGNOSIS — L03119 Cellulitis of unspecified part of limb: Secondary | ICD-10-CM | POA: Diagnosis present

## 2016-02-18 DIAGNOSIS — Z9049 Acquired absence of other specified parts of digestive tract: Secondary | ICD-10-CM

## 2016-02-18 DIAGNOSIS — I2781 Cor pulmonale (chronic): Secondary | ICD-10-CM | POA: Diagnosis present

## 2016-02-18 DIAGNOSIS — Y92099 Unspecified place in other non-institutional residence as the place of occurrence of the external cause: Secondary | ICD-10-CM

## 2016-02-18 DIAGNOSIS — W19XXXA Unspecified fall, initial encounter: Secondary | ICD-10-CM

## 2016-02-18 DIAGNOSIS — S0083XA Contusion of other part of head, initial encounter: Secondary | ICD-10-CM | POA: Diagnosis present

## 2016-02-18 DIAGNOSIS — W1830XA Fall on same level, unspecified, initial encounter: Secondary | ICD-10-CM | POA: Diagnosis present

## 2016-02-18 DIAGNOSIS — Z79899 Other long term (current) drug therapy: Secondary | ICD-10-CM

## 2016-02-18 DIAGNOSIS — J209 Acute bronchitis, unspecified: Secondary | ICD-10-CM | POA: Diagnosis present

## 2016-02-18 DIAGNOSIS — G92 Toxic encephalopathy: Secondary | ICD-10-CM | POA: Diagnosis present

## 2016-02-18 DIAGNOSIS — J449 Chronic obstructive pulmonary disease, unspecified: Secondary | ICD-10-CM | POA: Diagnosis present

## 2016-02-18 DIAGNOSIS — J189 Pneumonia, unspecified organism: Secondary | ICD-10-CM

## 2016-02-18 DIAGNOSIS — Z91040 Latex allergy status: Secondary | ICD-10-CM

## 2016-02-18 DIAGNOSIS — Z820 Family history of epilepsy and other diseases of the nervous system: Secondary | ICD-10-CM

## 2016-02-18 DIAGNOSIS — R531 Weakness: Secondary | ICD-10-CM | POA: Diagnosis not present

## 2016-02-18 DIAGNOSIS — S0003XA Contusion of scalp, initial encounter: Secondary | ICD-10-CM | POA: Diagnosis not present

## 2016-02-18 DIAGNOSIS — Z880 Allergy status to penicillin: Secondary | ICD-10-CM

## 2016-02-18 DIAGNOSIS — I2510 Atherosclerotic heart disease of native coronary artery without angina pectoris: Secondary | ICD-10-CM | POA: Diagnosis not present

## 2016-02-18 DIAGNOSIS — R404 Transient alteration of awareness: Secondary | ICD-10-CM | POA: Diagnosis not present

## 2016-02-18 DIAGNOSIS — J9601 Acute respiratory failure with hypoxia: Secondary | ICD-10-CM | POA: Diagnosis not present

## 2016-02-18 DIAGNOSIS — Z22322 Carrier or suspected carrier of Methicillin resistant Staphylococcus aureus: Secondary | ICD-10-CM

## 2016-02-18 DIAGNOSIS — Z7982 Long term (current) use of aspirin: Secondary | ICD-10-CM

## 2016-02-18 DIAGNOSIS — Z90710 Acquired absence of both cervix and uterus: Secondary | ICD-10-CM

## 2016-02-18 DIAGNOSIS — I2729 Other secondary pulmonary hypertension: Secondary | ICD-10-CM | POA: Diagnosis present

## 2016-02-18 DIAGNOSIS — J440 Chronic obstructive pulmonary disease with acute lower respiratory infection: Secondary | ICD-10-CM | POA: Diagnosis present

## 2016-02-18 DIAGNOSIS — M79604 Pain in right leg: Secondary | ICD-10-CM

## 2016-02-18 DIAGNOSIS — E870 Hyperosmolality and hypernatremia: Secondary | ICD-10-CM | POA: Diagnosis present

## 2016-02-18 DIAGNOSIS — Z888 Allergy status to other drugs, medicaments and biological substances status: Secondary | ICD-10-CM

## 2016-02-18 DIAGNOSIS — D631 Anemia in chronic kidney disease: Secondary | ICD-10-CM | POA: Diagnosis present

## 2016-02-18 DIAGNOSIS — Z515 Encounter for palliative care: Secondary | ICD-10-CM | POA: Diagnosis not present

## 2016-02-18 DIAGNOSIS — Z8249 Family history of ischemic heart disease and other diseases of the circulatory system: Secondary | ICD-10-CM

## 2016-02-18 DIAGNOSIS — J101 Influenza due to other identified influenza virus with other respiratory manifestations: Principal | ICD-10-CM | POA: Diagnosis present

## 2016-02-18 DIAGNOSIS — I5033 Acute on chronic diastolic (congestive) heart failure: Secondary | ICD-10-CM | POA: Diagnosis present

## 2016-02-18 DIAGNOSIS — Z881 Allergy status to other antibiotic agents status: Secondary | ICD-10-CM

## 2016-02-18 DIAGNOSIS — F419 Anxiety disorder, unspecified: Secondary | ICD-10-CM | POA: Diagnosis present

## 2016-02-18 DIAGNOSIS — R262 Difficulty in walking, not elsewhere classified: Secondary | ICD-10-CM | POA: Diagnosis not present

## 2016-02-18 DIAGNOSIS — J40 Bronchitis, not specified as acute or chronic: Secondary | ICD-10-CM | POA: Diagnosis present

## 2016-02-18 DIAGNOSIS — F0390 Unspecified dementia without behavioral disturbance: Secondary | ICD-10-CM | POA: Diagnosis present

## 2016-02-18 DIAGNOSIS — I10 Essential (primary) hypertension: Secondary | ICD-10-CM | POA: Diagnosis present

## 2016-02-18 DIAGNOSIS — I509 Heart failure, unspecified: Secondary | ICD-10-CM

## 2016-02-18 DIAGNOSIS — R1312 Dysphagia, oropharyngeal phase: Secondary | ICD-10-CM | POA: Diagnosis present

## 2016-02-18 DIAGNOSIS — L89892 Pressure ulcer of other site, stage 2: Secondary | ICD-10-CM | POA: Diagnosis present

## 2016-02-18 DIAGNOSIS — Z885 Allergy status to narcotic agent status: Secondary | ICD-10-CM

## 2016-02-18 DIAGNOSIS — J9602 Acute respiratory failure with hypercapnia: Secondary | ICD-10-CM | POA: Diagnosis present

## 2016-02-18 DIAGNOSIS — Z9181 History of falling: Secondary | ICD-10-CM

## 2016-02-18 DIAGNOSIS — L8990 Pressure ulcer of unspecified site, unspecified stage: Secondary | ICD-10-CM | POA: Insufficient documentation

## 2016-02-18 DIAGNOSIS — D696 Thrombocytopenia, unspecified: Secondary | ICD-10-CM | POA: Diagnosis present

## 2016-02-18 DIAGNOSIS — R509 Fever, unspecified: Secondary | ICD-10-CM | POA: Diagnosis not present

## 2016-02-18 DIAGNOSIS — Z882 Allergy status to sulfonamides status: Secondary | ICD-10-CM

## 2016-02-18 DIAGNOSIS — N183 Chronic kidney disease, stage 3 unspecified: Secondary | ICD-10-CM | POA: Diagnosis present

## 2016-02-18 DIAGNOSIS — I252 Old myocardial infarction: Secondary | ICD-10-CM

## 2016-02-18 DIAGNOSIS — Q820 Hereditary lymphedema: Secondary | ICD-10-CM | POA: Diagnosis not present

## 2016-02-18 DIAGNOSIS — J690 Pneumonitis due to inhalation of food and vomit: Secondary | ICD-10-CM | POA: Diagnosis present

## 2016-02-18 DIAGNOSIS — Z833 Family history of diabetes mellitus: Secondary | ICD-10-CM

## 2016-02-18 DIAGNOSIS — G4700 Insomnia, unspecified: Secondary | ICD-10-CM | POA: Diagnosis present

## 2016-02-18 DIAGNOSIS — E785 Hyperlipidemia, unspecified: Secondary | ICD-10-CM | POA: Diagnosis present

## 2016-02-18 DIAGNOSIS — G894 Chronic pain syndrome: Secondary | ICD-10-CM | POA: Diagnosis present

## 2016-02-18 DIAGNOSIS — Z79891 Long term (current) use of opiate analgesic: Secondary | ICD-10-CM

## 2016-02-18 DIAGNOSIS — I272 Pulmonary hypertension, unspecified: Secondary | ICD-10-CM | POA: Diagnosis present

## 2016-02-18 DIAGNOSIS — E039 Hypothyroidism, unspecified: Secondary | ICD-10-CM | POA: Diagnosis present

## 2016-02-18 DIAGNOSIS — I130 Hypertensive heart and chronic kidney disease with heart failure and stage 1 through stage 4 chronic kidney disease, or unspecified chronic kidney disease: Secondary | ICD-10-CM | POA: Diagnosis present

## 2016-02-18 DIAGNOSIS — G4733 Obstructive sleep apnea (adult) (pediatric): Secondary | ICD-10-CM | POA: Diagnosis present

## 2016-02-18 DIAGNOSIS — N184 Chronic kidney disease, stage 4 (severe): Secondary | ICD-10-CM | POA: Diagnosis present

## 2016-02-18 DIAGNOSIS — E440 Moderate protein-calorie malnutrition: Secondary | ICD-10-CM | POA: Diagnosis not present

## 2016-02-18 DIAGNOSIS — Z7189 Other specified counseling: Secondary | ICD-10-CM

## 2016-02-18 DIAGNOSIS — Z85038 Personal history of other malignant neoplasm of large intestine: Secondary | ICD-10-CM

## 2016-02-18 DIAGNOSIS — F329 Major depressive disorder, single episode, unspecified: Secondary | ICD-10-CM | POA: Diagnosis present

## 2016-02-18 DIAGNOSIS — I482 Chronic atrial fibrillation, unspecified: Secondary | ICD-10-CM | POA: Diagnosis present

## 2016-02-18 DIAGNOSIS — I071 Rheumatic tricuspid insufficiency: Secondary | ICD-10-CM | POA: Diagnosis present

## 2016-02-18 DIAGNOSIS — R296 Repeated falls: Secondary | ICD-10-CM | POA: Diagnosis present

## 2016-02-18 DIAGNOSIS — E876 Hypokalemia: Secondary | ICD-10-CM | POA: Diagnosis present

## 2016-02-18 DIAGNOSIS — R911 Solitary pulmonary nodule: Secondary | ICD-10-CM | POA: Diagnosis present

## 2016-02-18 DIAGNOSIS — I5082 Biventricular heart failure: Secondary | ICD-10-CM | POA: Diagnosis present

## 2016-02-18 HISTORY — DX: Procedure and treatment not carried out because of patient's decision for reasons of belief and group pressure: Z53.1

## 2016-02-18 HISTORY — DX: Reserved for inherently not codable concepts without codable children: IMO0001

## 2016-02-18 LAB — CBC WITH DIFFERENTIAL/PLATELET
BASOS ABS: 0 10*3/uL (ref 0.0–0.1)
Basophils Relative: 0 %
EOS PCT: 2 %
Eosinophils Absolute: 0.1 10*3/uL (ref 0.0–0.7)
HCT: 39 % (ref 36.0–46.0)
Hemoglobin: 12.8 g/dL (ref 12.0–15.0)
LYMPHS PCT: 17 %
Lymphs Abs: 0.7 10*3/uL (ref 0.7–4.0)
MCH: 33.2 pg (ref 26.0–34.0)
MCHC: 32.8 g/dL (ref 30.0–36.0)
MCV: 101 fL — ABNORMAL HIGH (ref 78.0–100.0)
Monocytes Absolute: 0.5 10*3/uL (ref 0.1–1.0)
Monocytes Relative: 14 %
Neutro Abs: 2.6 10*3/uL (ref 1.7–7.7)
Neutrophils Relative %: 67 %
PLATELETS: 73 10*3/uL — AB (ref 150–400)
RBC: 3.86 MIL/uL — AB (ref 3.87–5.11)
RDW: 16.1 % — ABNORMAL HIGH (ref 11.5–15.5)
WBC: 3.8 10*3/uL — AB (ref 4.0–10.5)

## 2016-02-18 LAB — COMPREHENSIVE METABOLIC PANEL
ALBUMIN: 3.3 g/dL — AB (ref 3.5–5.0)
ALT: 16 U/L (ref 14–54)
AST: 50 U/L — AB (ref 15–41)
Alkaline Phosphatase: 70 U/L (ref 38–126)
Anion gap: 19 — ABNORMAL HIGH (ref 5–15)
BUN: 75 mg/dL — AB (ref 6–20)
CHLORIDE: 91 mmol/L — AB (ref 101–111)
CO2: 25 mmol/L (ref 22–32)
CREATININE: 1.61 mg/dL — AB (ref 0.44–1.00)
Calcium: 9.8 mg/dL (ref 8.9–10.3)
GFR calc Af Amer: 34 mL/min — ABNORMAL LOW (ref >60)
GFR calc non Af Amer: 29 mL/min — ABNORMAL LOW (ref >60)
GLUCOSE: 80 mg/dL (ref 65–99)
Potassium: 3 mmol/L — ABNORMAL LOW (ref 3.5–5.1)
SODIUM: 135 mmol/L (ref 135–145)
Total Bilirubin: 1.4 mg/dL — ABNORMAL HIGH (ref 0.3–1.2)
Total Protein: 7.2 g/dL (ref 6.5–8.1)

## 2016-02-18 LAB — I-STAT TROPONIN, ED: Troponin i, poc: 0.05 ng/mL (ref 0.00–0.08)

## 2016-02-18 MED ORDER — IPRATROPIUM-ALBUTEROL 0.5-2.5 (3) MG/3ML IN SOLN
3.0000 mL | Freq: Once | RESPIRATORY_TRACT | Status: AC
  Administered 2016-02-18: 3 mL via RESPIRATORY_TRACT
  Filled 2016-02-18: qty 3

## 2016-02-18 NOTE — ED Notes (Signed)
Patient transported to X-ray via Biomedical scientist with xray tech
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
## 2016-02-18 NOTE — ED Triage Notes (Signed)
Patient here for a reported fever and flu like symptoms for the last week.  Patient is A&Ox4 with swelling noted to lower extremities.  CHF patient. On arrival temp was 98.4

## 2016-02-18 NOTE — Telephone Encounter (Signed)
New Message    Caregiver and [facility] George Juliann Pulse) will be bringing Victoria Fisher to doctors appointment 02/19/16, [facility] was at house 02/18/16 thinks she is self medicating, she is not eating, not getting out of bed.

## 2016-02-18 NOTE — Telephone Encounter (Signed)
Acknowledged-friend not on DPR.    Appt 02/19/16 with Almyra Deforest PA.  Sent to RN covering PA on 1/3 to make aware.

## 2016-02-18 NOTE — ED Notes (Signed)
ED Provider at bedside. 

## 2016-02-19 ENCOUNTER — Inpatient Hospital Stay (HOSPITAL_COMMUNITY): Payer: Medicare Other

## 2016-02-19 ENCOUNTER — Ambulatory Visit: Payer: Self-pay | Admitting: Physician Assistant

## 2016-02-19 ENCOUNTER — Emergency Department (HOSPITAL_COMMUNITY): Payer: Medicare Other

## 2016-02-19 ENCOUNTER — Encounter (HOSPITAL_COMMUNITY): Payer: Self-pay | Admitting: Internal Medicine

## 2016-02-19 ENCOUNTER — Other Ambulatory Visit: Payer: Self-pay

## 2016-02-19 DIAGNOSIS — I130 Hypertensive heart and chronic kidney disease with heart failure and stage 1 through stage 4 chronic kidney disease, or unspecified chronic kidney disease: Secondary | ICD-10-CM | POA: Diagnosis present

## 2016-02-19 DIAGNOSIS — L03119 Cellulitis of unspecified part of limb: Secondary | ICD-10-CM | POA: Diagnosis present

## 2016-02-19 DIAGNOSIS — I5033 Acute on chronic diastolic (congestive) heart failure: Secondary | ICD-10-CM | POA: Diagnosis present

## 2016-02-19 DIAGNOSIS — I2729 Other secondary pulmonary hypertension: Secondary | ICD-10-CM | POA: Diagnosis present

## 2016-02-19 DIAGNOSIS — R0603 Acute respiratory distress: Secondary | ICD-10-CM | POA: Diagnosis not present

## 2016-02-19 DIAGNOSIS — I272 Pulmonary hypertension, unspecified: Secondary | ICD-10-CM | POA: Diagnosis present

## 2016-02-19 DIAGNOSIS — J9600 Acute respiratory failure, unspecified whether with hypoxia or hypercapnia: Secondary | ICD-10-CM | POA: Diagnosis not present

## 2016-02-19 DIAGNOSIS — R4182 Altered mental status, unspecified: Secondary | ICD-10-CM | POA: Insufficient documentation

## 2016-02-19 DIAGNOSIS — J209 Acute bronchitis, unspecified: Secondary | ICD-10-CM | POA: Diagnosis present

## 2016-02-19 DIAGNOSIS — J40 Bronchitis, not specified as acute or chronic: Secondary | ICD-10-CM | POA: Diagnosis present

## 2016-02-19 DIAGNOSIS — J9602 Acute respiratory failure with hypercapnia: Secondary | ICD-10-CM | POA: Diagnosis present

## 2016-02-19 DIAGNOSIS — R509 Fever, unspecified: Secondary | ICD-10-CM | POA: Diagnosis not present

## 2016-02-19 DIAGNOSIS — J181 Lobar pneumonia, unspecified organism: Secondary | ICD-10-CM | POA: Diagnosis not present

## 2016-02-19 DIAGNOSIS — S0003XA Contusion of scalp, initial encounter: Secondary | ICD-10-CM | POA: Diagnosis not present

## 2016-02-19 DIAGNOSIS — I517 Cardiomegaly: Secondary | ICD-10-CM | POA: Diagnosis not present

## 2016-02-19 DIAGNOSIS — G92 Toxic encephalopathy: Secondary | ICD-10-CM | POA: Diagnosis present

## 2016-02-19 DIAGNOSIS — E440 Moderate protein-calorie malnutrition: Secondary | ICD-10-CM | POA: Diagnosis present

## 2016-02-19 DIAGNOSIS — J690 Pneumonitis due to inhalation of food and vomit: Secondary | ICD-10-CM | POA: Diagnosis present

## 2016-02-19 DIAGNOSIS — R2689 Other abnormalities of gait and mobility: Secondary | ICD-10-CM | POA: Diagnosis not present

## 2016-02-19 DIAGNOSIS — I071 Rheumatic tricuspid insufficiency: Secondary | ICD-10-CM | POA: Diagnosis present

## 2016-02-19 DIAGNOSIS — M6289 Other specified disorders of muscle: Secondary | ICD-10-CM | POA: Diagnosis not present

## 2016-02-19 DIAGNOSIS — J9690 Respiratory failure, unspecified, unspecified whether with hypoxia or hypercapnia: Secondary | ICD-10-CM | POA: Diagnosis not present

## 2016-02-19 DIAGNOSIS — N183 Chronic kidney disease, stage 3 (moderate): Secondary | ICD-10-CM | POA: Diagnosis not present

## 2016-02-19 DIAGNOSIS — Z7189 Other specified counseling: Secondary | ICD-10-CM | POA: Diagnosis not present

## 2016-02-19 DIAGNOSIS — I10 Essential (primary) hypertension: Secondary | ICD-10-CM | POA: Diagnosis not present

## 2016-02-19 DIAGNOSIS — Y92099 Unspecified place in other non-institutional residence as the place of occurrence of the external cause: Secondary | ICD-10-CM | POA: Diagnosis not present

## 2016-02-19 DIAGNOSIS — I482 Chronic atrial fibrillation: Secondary | ICD-10-CM | POA: Diagnosis present

## 2016-02-19 DIAGNOSIS — J9601 Acute respiratory failure with hypoxia: Secondary | ICD-10-CM | POA: Diagnosis present

## 2016-02-19 DIAGNOSIS — J101 Influenza due to other identified influenza virus with other respiratory manifestations: Secondary | ICD-10-CM | POA: Diagnosis present

## 2016-02-19 DIAGNOSIS — J440 Chronic obstructive pulmonary disease with acute lower respiratory infection: Secondary | ICD-10-CM | POA: Diagnosis present

## 2016-02-19 DIAGNOSIS — Z515 Encounter for palliative care: Secondary | ICD-10-CM | POA: Diagnosis not present

## 2016-02-19 DIAGNOSIS — N184 Chronic kidney disease, stage 4 (severe): Secondary | ICD-10-CM | POA: Diagnosis present

## 2016-02-19 DIAGNOSIS — E870 Hyperosmolality and hypernatremia: Secondary | ICD-10-CM | POA: Diagnosis present

## 2016-02-19 DIAGNOSIS — D696 Thrombocytopenia, unspecified: Secondary | ICD-10-CM | POA: Diagnosis present

## 2016-02-19 DIAGNOSIS — W1830XA Fall on same level, unspecified, initial encounter: Secondary | ICD-10-CM | POA: Diagnosis present

## 2016-02-19 DIAGNOSIS — M6281 Muscle weakness (generalized): Secondary | ICD-10-CM | POA: Diagnosis not present

## 2016-02-19 DIAGNOSIS — D631 Anemia in chronic kidney disease: Secondary | ICD-10-CM | POA: Diagnosis present

## 2016-02-19 DIAGNOSIS — I2781 Cor pulmonale (chronic): Secondary | ICD-10-CM | POA: Diagnosis present

## 2016-02-19 DIAGNOSIS — L89892 Pressure ulcer of other site, stage 2: Secondary | ICD-10-CM | POA: Diagnosis present

## 2016-02-19 DIAGNOSIS — S3993XA Unspecified injury of pelvis, initial encounter: Secondary | ICD-10-CM | POA: Diagnosis not present

## 2016-02-19 DIAGNOSIS — R0602 Shortness of breath: Secondary | ICD-10-CM | POA: Diagnosis not present

## 2016-02-19 DIAGNOSIS — G9340 Encephalopathy, unspecified: Secondary | ICD-10-CM | POA: Diagnosis not present

## 2016-02-19 DIAGNOSIS — I5082 Biventricular heart failure: Secondary | ICD-10-CM | POA: Diagnosis present

## 2016-02-19 DIAGNOSIS — R1312 Dysphagia, oropharyngeal phase: Secondary | ICD-10-CM | POA: Diagnosis present

## 2016-02-19 DIAGNOSIS — J189 Pneumonia, unspecified organism: Secondary | ICD-10-CM | POA: Diagnosis not present

## 2016-02-19 LAB — I-STAT CG4 LACTIC ACID, ED: LACTIC ACID, VENOUS: 1.1 mmol/L (ref 0.5–1.9)

## 2016-02-19 LAB — CBC WITH DIFFERENTIAL/PLATELET
BASOS ABS: 0 10*3/uL (ref 0.0–0.1)
Basophils Relative: 0 %
EOS PCT: 0 %
Eosinophils Absolute: 0 10*3/uL (ref 0.0–0.7)
HCT: 37.4 % (ref 36.0–46.0)
Hemoglobin: 12 g/dL (ref 12.0–15.0)
LYMPHS PCT: 15 %
Lymphs Abs: 0.6 10*3/uL — ABNORMAL LOW (ref 0.7–4.0)
MCH: 32.2 pg (ref 26.0–34.0)
MCHC: 32.1 g/dL (ref 30.0–36.0)
MCV: 100.3 fL — AB (ref 78.0–100.0)
MONO ABS: 0.9 10*3/uL (ref 0.1–1.0)
Monocytes Relative: 23 %
Neutro Abs: 2.3 10*3/uL (ref 1.7–7.7)
Neutrophils Relative %: 61 %
PLATELETS: 90 10*3/uL — AB (ref 150–400)
RBC: 3.73 MIL/uL — ABNORMAL LOW (ref 3.87–5.11)
RDW: 15.9 % — AB (ref 11.5–15.5)
WBC: 3.7 10*3/uL — ABNORMAL LOW (ref 4.0–10.5)

## 2016-02-19 LAB — COMPREHENSIVE METABOLIC PANEL
ALBUMIN: 3.1 g/dL — AB (ref 3.5–5.0)
ALK PHOS: 63 U/L (ref 38–126)
ALT: 15 U/L (ref 14–54)
AST: 54 U/L — AB (ref 15–41)
Anion gap: 15 (ref 5–15)
BILIRUBIN TOTAL: 1.5 mg/dL — AB (ref 0.3–1.2)
BUN: 75 mg/dL — AB (ref 6–20)
CALCIUM: 9.4 mg/dL (ref 8.9–10.3)
CO2: 29 mmol/L (ref 22–32)
Chloride: 94 mmol/L — ABNORMAL LOW (ref 101–111)
Creatinine, Ser: 1.57 mg/dL — ABNORMAL HIGH (ref 0.44–1.00)
GFR calc Af Amer: 35 mL/min — ABNORMAL LOW (ref >60)
GFR calc non Af Amer: 30 mL/min — ABNORMAL LOW (ref >60)
GLUCOSE: 81 mg/dL (ref 65–99)
Potassium: 2.8 mmol/L — ABNORMAL LOW (ref 3.5–5.1)
Sodium: 138 mmol/L (ref 135–145)
Total Protein: 6.5 g/dL (ref 6.5–8.1)

## 2016-02-19 LAB — URINALYSIS, ROUTINE W REFLEX MICROSCOPIC
BILIRUBIN URINE: NEGATIVE
Glucose, UA: NEGATIVE mg/dL
Hgb urine dipstick: NEGATIVE
KETONES UR: NEGATIVE mg/dL
LEUKOCYTES UA: NEGATIVE
NITRITE: NEGATIVE
PROTEIN: NEGATIVE mg/dL
Specific Gravity, Urine: 1.01 (ref 1.005–1.030)
pH: 5 (ref 5.0–8.0)

## 2016-02-19 LAB — I-STAT ARTERIAL BLOOD GAS, ED
Acid-Base Excess: 9 mmol/L — ABNORMAL HIGH (ref 0.0–2.0)
Bicarbonate: 34.8 mmol/L — ABNORMAL HIGH (ref 20.0–28.0)
O2 Saturation: 89 %
PCO2 ART: 53.1 mmHg — AB (ref 32.0–48.0)
PH ART: 7.424 (ref 7.350–7.450)
PO2 ART: 57 mmHg — AB (ref 83.0–108.0)
Patient temperature: 98.4
TCO2: 36 mmol/L (ref 0–100)

## 2016-02-19 LAB — INFLUENZA PANEL BY PCR (TYPE A & B)
INFLAPCR: POSITIVE — AB
Influenza B By PCR: NEGATIVE

## 2016-02-19 LAB — BRAIN NATRIURETIC PEPTIDE: B Natriuretic Peptide: 344.5 pg/mL — ABNORMAL HIGH (ref 0.0–100.0)

## 2016-02-19 LAB — TROPONIN I
Troponin I: 0.04 ng/mL (ref <0.03)
Troponin I: 0.04 ng/mL (ref <0.03)
Troponin I: 0.04 ng/mL (ref <0.03)

## 2016-02-19 LAB — TSH: TSH: 12.767 u[IU]/mL — ABNORMAL HIGH (ref 0.350–4.500)

## 2016-02-19 MED ORDER — LEVALBUTEROL TARTRATE 45 MCG/ACT IN AERO: 1.0000 | INHALATION_SPRAY | RESPIRATORY_TRACT | Status: DC | PRN

## 2016-02-19 MED ORDER — SODIUM CHLORIDE 0.9 % IV SOLN
Freq: Once | INTRAVENOUS | Status: AC
  Administered 2016-02-19: 01:00:00 via INTRAVENOUS

## 2016-02-19 MED ORDER — OXYCODONE HCL 5 MG PO TABS
5.0000 mg | ORAL_TABLET | Freq: Once | ORAL | Status: AC
  Administered 2016-02-19: 5 mg via ORAL
  Filled 2016-02-19: qty 1

## 2016-02-19 MED ORDER — POLYETHYLENE GLYCOL 3350 17 G PO PACK: 17.0000 g | PACK | Freq: Two times a day (BID) | ORAL | Status: DC | PRN

## 2016-02-19 MED ORDER — ONDANSETRON HCL 4 MG PO TABS
4.0000 mg | ORAL_TABLET | Freq: Four times a day (QID) | ORAL | Status: DC | PRN
  Filled 2016-02-19: qty 1

## 2016-02-19 MED ORDER — LEVOTHYROXINE SODIUM 112 MCG PO TABS
112.0000 ug | ORAL_TABLET | Freq: Every day | ORAL | Status: DC
  Administered 2016-02-19 – 2016-02-24 (×6): 112 ug via ORAL
  Filled 2016-02-19 (×7): qty 1

## 2016-02-19 MED ORDER — POTASSIUM CHLORIDE CRYS ER 20 MEQ PO TBCR
60.0000 meq | EXTENDED_RELEASE_TABLET | Freq: Two times a day (BID) | ORAL | Status: DC
  Administered 2016-02-19 – 2016-02-22 (×7): 60 meq via ORAL
  Filled 2016-02-19 (×7): qty 3

## 2016-02-19 MED ORDER — VITAMIN D 1000 UNITS PO TABS
2000.0000 [IU] | ORAL_TABLET | Freq: Every day | ORAL | Status: DC
  Administered 2016-02-19 – 2016-02-24 (×6): 2000 [IU] via ORAL
  Filled 2016-02-19 (×6): qty 2

## 2016-02-19 MED ORDER — ONDANSETRON HCL 4 MG/2ML IJ SOLN
4.0000 mg | Freq: Four times a day (QID) | INTRAMUSCULAR | Status: DC | PRN
  Administered 2016-02-23 – 2016-02-24 (×2): 4 mg via INTRAVENOUS
  Filled 2016-02-19 (×2): qty 2

## 2016-02-19 MED ORDER — ALBUTEROL SULFATE (2.5 MG/3ML) 0.083% IN NEBU
2.5000 mg | INHALATION_SOLUTION | RESPIRATORY_TRACT | Status: DC | PRN
  Administered 2016-02-24 – 2016-02-29 (×4): 2.5 mg via RESPIRATORY_TRACT
  Filled 2016-02-19 (×4): qty 3

## 2016-02-19 MED ORDER — OSELTAMIVIR PHOSPHATE 30 MG PO CAPS
30.0000 mg | ORAL_CAPSULE | Freq: Every day | ORAL | Status: AC
  Administered 2016-02-19 – 2016-02-23 (×5): 30 mg via ORAL
  Filled 2016-02-19 (×5): qty 1

## 2016-02-19 MED ORDER — METOLAZONE 5 MG PO TABS
5.0000 mg | ORAL_TABLET | ORAL | Status: DC
  Administered 2016-02-19 – 2016-02-24 (×3): 5 mg via ORAL
  Filled 2016-02-19 (×3): qty 1

## 2016-02-19 MED ORDER — VITAMIN E 180 MG (400 UNIT) PO CAPS
400.0000 [IU] | ORAL_CAPSULE | Freq: Every day | ORAL | Status: DC
  Administered 2016-02-19 – 2016-02-24 (×6): 400 [IU] via ORAL
  Filled 2016-02-19 (×7): qty 1

## 2016-02-19 MED ORDER — SENNA 8.6 MG PO TABS: 2.0000 | ORAL_TABLET | Freq: Every day | ORAL | Status: DC | PRN

## 2016-02-19 MED ORDER — ACETAMINOPHEN 650 MG RE SUPP
650.0000 mg | Freq: Four times a day (QID) | RECTAL | Status: DC | PRN
  Administered 2016-02-25: 650 mg via RECTAL
  Filled 2016-02-19: qty 1

## 2016-02-19 MED ORDER — MORPHINE SULFATE 15 MG PO TABS
30.0000 mg | ORAL_TABLET | Freq: Three times a day (TID) | ORAL | Status: DC | PRN
  Administered 2016-02-20 – 2016-02-21 (×2): 30 mg via ORAL
  Filled 2016-02-19 (×2): qty 2

## 2016-02-19 MED ORDER — POTASSIUM CHLORIDE CRYS ER 20 MEQ PO TBCR
40.0000 meq | EXTENDED_RELEASE_TABLET | Freq: Once | ORAL | Status: AC
  Administered 2016-02-19: 40 meq via ORAL
  Filled 2016-02-19: qty 2

## 2016-02-19 MED ORDER — DOXYCYCLINE HYCLATE 100 MG IV SOLR
100.0000 mg | Freq: Two times a day (BID) | INTRAVENOUS | Status: DC
  Administered 2016-02-19 – 2016-02-20 (×2): 100 mg via INTRAVENOUS
  Filled 2016-02-19 (×4): qty 100

## 2016-02-19 MED ORDER — SERTRALINE HCL 100 MG PO TABS
100.0000 mg | ORAL_TABLET | Freq: Every day | ORAL | Status: DC
  Administered 2016-02-19 – 2016-02-24 (×6): 100 mg via ORAL
  Filled 2016-02-19 (×6): qty 1

## 2016-02-19 MED ORDER — CALCITRIOL 0.25 MCG PO CAPS
0.2500 ug | ORAL_CAPSULE | Freq: Every day | ORAL | Status: DC
  Administered 2016-02-19 – 2016-02-24 (×6): 0.25 ug via ORAL
  Filled 2016-02-19 (×8): qty 1

## 2016-02-19 MED ORDER — ATORVASTATIN CALCIUM 40 MG PO TABS
40.0000 mg | ORAL_TABLET | Freq: Every day | ORAL | Status: DC
  Administered 2016-02-20 – 2016-02-24 (×5): 40 mg via ORAL
  Filled 2016-02-19 (×5): qty 1

## 2016-02-19 MED ORDER — OSELTAMIVIR PHOSPHATE 30 MG PO CAPS
30.0000 mg | ORAL_CAPSULE | Freq: Once | ORAL | Status: AC
  Administered 2016-02-19: 30 mg via ORAL
  Filled 2016-02-19: qty 1

## 2016-02-19 MED ORDER — IPRATROPIUM-ALBUTEROL 0.5-2.5 (3) MG/3ML IN SOLN
3.0000 mL | RESPIRATORY_TRACT | Status: DC
  Administered 2016-02-19 (×2): 3 mL via RESPIRATORY_TRACT
  Filled 2016-02-19 (×2): qty 3

## 2016-02-19 MED ORDER — DARBEPOETIN ALFA 100 MCG/0.5ML IJ SOSY
100.0000 ug | PREFILLED_SYRINGE | INTRAMUSCULAR | Status: DC
  Filled 2016-02-19: qty 0.5

## 2016-02-19 MED ORDER — TORSEMIDE 20 MG PO TABS
100.0000 mg | ORAL_TABLET | Freq: Two times a day (BID) | ORAL | Status: DC
  Administered 2016-02-19 – 2016-02-20 (×3): 100 mg via ORAL
  Filled 2016-02-19 (×4): qty 5

## 2016-02-19 MED ORDER — METOPROLOL SUCCINATE ER 25 MG PO TB24
50.0000 mg | ORAL_TABLET | Freq: Every day | ORAL | Status: DC
  Administered 2016-02-19 – 2016-02-23 (×5): 50 mg via ORAL
  Filled 2016-02-19 (×2): qty 2
  Filled 2016-02-19: qty 1
  Filled 2016-02-19 (×3): qty 2

## 2016-02-19 MED ORDER — ACETAMINOPHEN 325 MG PO TABS
650.0000 mg | ORAL_TABLET | Freq: Four times a day (QID) | ORAL | Status: DC | PRN
  Administered 2016-02-21 – 2016-02-23 (×3): 650 mg via ORAL
  Filled 2016-02-19 (×4): qty 2

## 2016-02-19 MED ORDER — MAGNESIUM OXIDE 400 (241.3 MG) MG PO TABS
400.0000 mg | ORAL_TABLET | Freq: Every day | ORAL | Status: DC
  Administered 2016-02-19 – 2016-02-24 (×6): 400 mg via ORAL
  Filled 2016-02-19 (×6): qty 1

## 2016-02-19 MED ORDER — METHYLPREDNISOLONE SODIUM SUCC 125 MG IJ SOLR
125.0000 mg | Freq: Once | INTRAMUSCULAR | Status: AC
  Administered 2016-02-19: 125 mg via INTRAVENOUS
  Filled 2016-02-19: qty 2

## 2016-02-19 MED ORDER — ASPIRIN EC 81 MG PO TBEC
81.0000 mg | DELAYED_RELEASE_TABLET | Freq: Every day | ORAL | Status: DC
  Administered 2016-02-19 – 2016-02-24 (×6): 81 mg via ORAL
  Filled 2016-02-19 (×6): qty 1

## 2016-02-19 MED ORDER — IPRATROPIUM-ALBUTEROL 0.5-2.5 (3) MG/3ML IN SOLN
3.0000 mL | Freq: Four times a day (QID) | RESPIRATORY_TRACT | Status: DC
  Administered 2016-02-20 – 2016-02-21 (×8): 3 mL via RESPIRATORY_TRACT
  Filled 2016-02-19 (×9): qty 3

## 2016-02-19 MED ORDER — NITROGLYCERIN 0.4 MG SL SUBL: 0.4000 mg | SUBLINGUAL_TABLET | SUBLINGUAL | Status: DC | PRN

## 2016-02-19 MED ORDER — FERROUS SULFATE 325 (65 FE) MG PO TABS
325.0000 mg | ORAL_TABLET | Freq: Every day | ORAL | Status: DC
  Administered 2016-02-20 – 2016-02-24 (×5): 325 mg via ORAL
  Filled 2016-02-19 (×5): qty 1

## 2016-02-19 MED ORDER — MORPHINE SULFATE 15 MG PO TABS
15.0000 mg | ORAL_TABLET | Freq: Three times a day (TID) | ORAL | Status: DC | PRN
  Administered 2016-02-19: 15 mg via ORAL
  Filled 2016-02-19: qty 1

## 2016-02-19 MED ORDER — IPRATROPIUM-ALBUTEROL 0.5-2.5 (3) MG/3ML IN SOLN: 3.0000 mL | RESPIRATORY_TRACT | Status: DC | PRN

## 2016-02-19 NOTE — ED Notes (Signed)
ABG collected  
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
## 2016-02-19 NOTE — H&P (Signed)
History and Physical    Victoria Fisher [id number] DOB: 10-28-1934 DOA: 02/18/2016  PCP: Haywood Pao, MD  Patient coming from: Assisted living facility.  Chief Complaint: Shortness of breath.  HPI: Victoria Fisher is a 82 y.o. female with history of diastolic CHF, cor pulmonale, hypertension, chronic lymphedema CAD chronic kidney disease A. fib presents to the ER because of shortness of breath and productive cough. Patient also has been feeling weak and has been having frequent falls. Initial ER physician note the patient was confused for which CT head was done which showed scalp hematoma and left facial contusion. On exam patient was having mild wheezing. Patient was given nebulizer treatment and influenza PCR was ordered which is pending. On exam patient also has lower extremity lymphedema which is chronic but on exam patient has tenderness and erythema all the both lower extremity. Patient is being admitted for bronchitis and lower extremity cellulitis.  On exam patient also has left periorbital hematoma and frontal hematoma from fall which patient states she has frequently. Chest x-ray shows global enlargement of the heart.  ED Course: Patient was started on nebulizer treatment and antibiotics for cellulitis.  Review of Systems: As per HPI, rest all negative.   Past Medical History:  Diagnosis Date  . Abnormality of gait 01/30/2015  . Anemia in CKD (chronic kidney disease)   . Anxiety   . Arthritis   . Asthma    On nebulizer  . Atrial fibrillation, chronic ([place])    a. Rate controlled w/ BB. No OAC 2/2 falls and gait instability.  . Bowel obstruction    Caused by scar tissue  . Chronic cor pulmonale (HCC)    Most likely due to COPD (cannot rule out contribution of diastolic LV dysfunction). ECHO 07/30/11 pulmonary artery pressure was estimated to be 50-60% mmHg & there was moderate tricuspid insufficiency.  . Chronic diastolic CHF (congestive heart failure) ([place])    a.  Predominantly right heart failure due to cor pulmonale & in turn this is most likely due to untreated OSA;  b. 05/2013 Echo: EF 55-60%, no rwma, mild to mod MR, sev dil LA, mod to sev dil RV w/ reduced systolic fxn, massively dil RA, mod-sev TR, PASP 64mmHg.  [name] Chronic pain syndrome   . Colon cancer ([place])    Carcinoma of sigmoid colon 1984 & 1985.  [name] COPD (chronic obstructive pulmonary disease) ([place])   . Coronary artery disease    a. occluded proximal diagonal artery on angiography 2004  . Depression   . Diverticulosis of colon   . 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 Dyspnea on exertion    Reluctant to take diuretics  . Edema of both legs    Venous Doppler 05/11/12 was normal & no evidence of thrombus or thrombophlebitis. Chronic LE edema related to both cor pulmonale & lymphedema, hyperlipidemia & HTN involving coronary atherosclerosis by cardiac cath in 2004. Cath showed a tiny old occlusion of the optional diagonal branch & also proximal LAD.  [name] Frequent falls    Possibly due to orthostatic hypotension but we have not seen that on our visits. Recent fall 05/2012 with scalp hematoma, swollen left LE, & black eyes.  . Hyperlipidemia    On a statin. Catheterization 02/12/03 Showed a tiny old occlusion of the optional diagonal branch & also proximal LAD.  [name] Hypertension   . Hypothyroidism 2002   On hormone replacement therapy  . IBS (irritable bowel syndrome)   . Insomnia   .  Kidney failure 09/05/2015   Pt states that she went to the doctor on thursday and was told she is in stage 4 kidney failure  . Lung nodule   . Milroy's disease    Lymphatic disorder diagnosed at [facility]  . Obstructive sleep apnea    Polysomnogram 09/25/11 AHI: 53.6/hr overall & 57.1/hr during REM sleep. Poor compliance previously with CPAP  . Pulmonary hypertension    Pullmonary hypertension, chronic cor pulmonale w/pulmonary artery pressures of 50&#x2011; 60 mmHg.  . Tuberculosis     Past Surgical History:  Procedure  Laterality Date  . ABDOMINAL HYSTERECTOMY  1983  . APPENDECTOMY  1956  . CARDIAC CATHETERIZATION  02/12/03   Showed a tiny old occlusion of the optional diagonal branch & also proximal LAD.  [name] CHOLECYSTECTOMY  1984  . COLON BIOPSY    . COLON RESECTION     2 times  . COLONOSCOPY  07/23/10     reports that she has never smoked. She has never used smokeless tobacco. She reports that she does not drink alcohol or use drugs.  Allergies  Allergen Reactions  . Codeine Anaphylaxis, Hives and Nausea And Vomiting  . Gabapentin Swelling and Other (See Comments)    Hallucinations, severe facial swelling   . Other Other (See Comments)    Will not accept blood or blood products - patient is Jehovah's Witness  . Ciprofloxacin Hives and Nausea And Vomiting  . Latex Hives and Rash  . Penicillins Hives    Has patient had a PCN reaction causing immediate rash, facial/tongue/throat swelling, SOB or lightheadedness with hypotension: Yes Has patient had a PCN reaction causing severe rash involving mucus membranes or skin necrosis: Yes Has patient had a PCN reaction that required hospitalization Yes Has patient had a PCN reaction occurring within the last 10 years: Yes If all of the above answers are "NO", then may proceed with Cephalosporin use.  . Sulfonamide Derivatives Hives and Rash    Family History  Problem Relation Age of Onset  . Heart disease Father   . Alzheimer's disease Mother   . Parkinsonism Brother   . Diabetes Brother     Prior to Admission medications   Medication Sig Start Date End Date Taking? Authorizing Provider  aspirin EC 81 MG tablet Take 81 mg by mouth daily.     Historical Provider, MD  atorvastatin (LIPITOR) 40 MG tablet Take 40 mg by mouth daily after supper.     Historical Provider, MD  calcitRIOL (ROCALTROL) 0.25 MCG capsule Take 1 capsule (0.25 mcg total) by mouth daily. 11/18/15   Kelvin Cellar, MD  Cholecalciferol (VITAMIN D) 2000 units CAPS Take 2,000 Units by  mouth daily.    Historical Provider, MD  Darbepoetin Alfa (ARANESP) 100 MCG/0.5ML SOSY injection Inject 0.5 mLs (100 mcg total) into the skin every Friday at 6 PM. 11/22/15   Kelvin Cellar, MD  ferrous sulfate 325 (65 FE) MG tablet Take 325 mg by mouth daily after supper.     Historical Provider, MD  levalbuterol (XOPENEX HFA) 45 MCG/ACT inhaler Inhale 1 puff into the lungs every 4 (four) hours as needed for wheezing or shortness of breath.     Historical Provider, MD  levothyroxine (SYNTHROID, LEVOTHROID) 112 MCG tablet Take 1 tablet (112 mcg total) by mouth every morning. 06/30/13   Mihai Croitoru, MD  magnesium oxide (MAG-OX) 400 (241.3 MG) MG tablet Take 1 tablet (400 mg total) by mouth daily. 07/11/13   Haywood Pao, MD  metolazone (ZAROXOLYN)  5 MG tablet Take 5 mg (1 tablet) once daily on Monday Wednesday and Friday only 12/19/15   Shirley Friar, PA-C  metoprolol succinate (TOPROL-XL) 50 MG 24 hr tablet Take 50 mg by mouth daily. Take with or immediately following a meal.    Historical Provider, MD  morphine (MS CONTIN) 30 MG 12 hr tablet Take 1 tablet (30 mg total) by mouth 2 (two) times daily. 09/13/15   Allie Bossier, MD  morphine (MSIR) 15 MG tablet Take 1 tablet (15 mg total) by mouth 3 (three) times daily as needed (breakthrough pain). 09/13/15   Allie Bossier, MD  nitroGLYCERIN (NITROSTAT) 0.4 MG SL tablet Place 1 tablet (0.4 mg total) under the tongue every 5 (five) minutes as needed for chest pain. 12/02/15 03/01/16  Mihai Croitoru, MD  polyethylene glycol (MIRALAX / GLYCOLAX) packet Take 17 g by mouth 2 (two) times daily as needed (constipation). Mix in 8 oz liquid and drink    Historical Provider, MD  potassium chloride SA (K-DUR,KLOR-CON) 20 MEQ tablet TAKE 3 TABLETS BY MOUTH TWICE DAILY. TAKE 1 TABLET EXTRA ON MONDAY, WEDNESDAY, AND FRIDAYS WITH METOLAZONE. 02/11/16   Shirley Friar, PA-C  senna (SENOKOT) 8.6 MG TABS tablet Take 2-3 tablets by mouth daily as needed  for mild constipation.     Historical Provider, MD  sertraline (ZOLOFT) 100 MG tablet Take 100 mg by mouth daily.    Historical Provider, MD  torsemide (DEMADEX) 100 MG tablet Take 1 tablet (100 mg total) by mouth 2 (two) times daily. 11/21/15   Mihai Croitoru, MD  vitamin E 400 UNIT capsule Take 400 Units by mouth daily.    Historical Provider, MD    Physical Exam: Vitals:   02/18/16 2158 02/19/16 0043 02/19/16 0219 02/19/16 0315  BP: 116/57 105/70  117/56  Pulse: 87 71 100 86  Resp: 20 13 22 17   Temp: 98.4 F (36.9 C)     TempSrc: Oral     SpO2: 95% 95% 96% 93%  Weight: 74.8 kg (165 lb)     Height: 5\' 4"  (1.626 m)         Constitutional: Moderately built and nourished. Vitals:   02/18/16 2158 02/19/16 0043 02/19/16 0219 02/19/16 0315  BP: 116/57 105/70  117/56  Pulse: 87 71 100 86  Resp: 20 13 22 17   Temp: 98.4 F (36.9 C)     TempSrc: Oral     SpO2: 95% 95% 96% 93%  Weight: 74.8 kg (165 lb)     Height: 5\' 4"  (1.626 m)      Eyes: Anicteric no pallor. Left periorbital hematoma. ENMT: No discharge from the ears eyes nose and mouth. Neck: No mass felt. No neck rigidity. Respiratory: Mild expiratory wheeze no crepitations. Cardiovascular: S1 and S2 heard. No murmurs appreciated. Abdomen: Soft nontender bowel sounds present. Musculoskeletal: Bilateral lower extremity edema with erythema. Skin: Erythema over the both lower extremity. Ecchymotic areas on the face. Neurologic: Alert awake oriented to time place and person. Moves all extremity is. Psychiatric: Appears normal. Normal affect.   Labs on Admission: I have personally reviewed following labs and imaging studies  CBC:  Recent Labs Lab 02/18/16 2220  WBC 3.8*  NEUTROABS 2.6  HGB 12.8  HCT 39.0  MCV 101.0*  PLT 73*   Basic Metabolic Panel:  Recent Labs Lab 02/18/16 2220  NA 135  K 3.0*  CL 91*  CO2 25  GLUCOSE 80  BUN 75*  CREATININE 1.61*  CALCIUM 9.8  GFR: Estimated Creatinine Clearance:  28 mL/min (by C-G formula based on SCr of 1.61 mg/dL (H)). Liver Function Tests:  Recent Labs Lab 02/18/16 2220  AST 50*  ALT 16  ALKPHOS 70  BILITOT 1.4*  PROT 7.2  ALBUMIN 3.3*   No results for input(s): LIPASE, AMYLASE in the last 168 hours. No results for input(s): AMMONIA in the last 168 hours. Coagulation Profile: No results for input(s): INR, PROTIME in the last 168 hours. Cardiac Enzymes: No results for input(s): CKTOTAL, CKMB, CKMBINDEX, TROPONINI in the last 168 hours. BNP (last 3 results) No results for input(s): PROBNP in the last 8760 hours. HbA1C: No results for input(s): HGBA1C in the last 72 hours. CBG: No results for input(s): GLUCAP in the last 168 hours. Lipid Profile: No results for input(s): CHOL, HDL, LDLCALC, TRIG, CHOLHDL, LDLDIRECT in the last 72 hours. Thyroid Function Tests: No results for input(s): TSH, T4TOTAL, FREET4, T3FREE, THYROIDAB in the last 72 hours. Anemia Panel: No results for input(s): VITAMINB12, FOLATE, FERRITIN, TIBC, IRON, RETICCTPCT in the last 72 hours. Urine analysis:    Component Value Date/Time   COLORURINE YELLOW 02/18/2016 2358   APPEARANCEUR CLEAR 02/18/2016 2358   LABSPEC 1.010 02/18/2016 2358   PHURINE 5.0 02/18/2016 2358   GLUCOSEU NEGATIVE 02/18/2016 2358   HGBUR NEGATIVE 02/18/2016 2358   BILIRUBINUR NEGATIVE 02/18/2016 2358   KETONESUR NEGATIVE 02/18/2016 2358   PROTEINUR NEGATIVE 02/18/2016 2358   UROBILINOGEN 0.2 11/18/2014 0600   NITRITE NEGATIVE 02/18/2016 2358   LEUKOCYTESUR NEGATIVE 02/18/2016 2358   Sepsis Labs: @LABRCNTIP (procalcitonin:4,lacticidven:4) )No results found for this or any previous visit (from the past 240 hour(s)).   Radiological Exams on Admission: Dg Chest 2 View  Result Date: 02/18/2016 CLINICAL DATA:  Fever and flu like symptoms EXAM: CHEST  2 VIEW COMPARISON:  02/05/2016 FINDINGS: Moderate cardiomegaly with globular cardiac configuration as before. Atherosclerosis of the aorta. No  acute consolidation or effusion. No pneumothorax. IMPRESSION: 1. Moderate globular enlargement of the cardiac silhouette without overt failure 2. No acute infiltrates Electronically Signed   By: Donavan Foil M.D.   On: 02/18/2016 22:56   Ct Head Wo Contrast  Result Date: 02/19/2016 CLINICAL DATA:  Wheezing, fever. History of hypertension, frequent falls, colon cancer. EXAM: CT HEAD WITHOUT CONTRAST TECHNIQUE: Contiguous axial images were obtained from the base of the skull through the vertex without intravenous contrast. COMPARISON:  CT HEAD January 31, 2016 FINDINGS: BRAIN: The ventricles and sulci are normal for age. No intraparenchymal hemorrhage, mass effect nor midline shift. Patchy supratentorial white matter hypodensities less than expected for patient's age, though non-specific are most compatible with chronic small vessel ischemic disease. No acute large vascular territory infarcts. No abnormal extra-axial fluid collections. Basal cisterns are patent. VASCULAR: Mild to moderate calcific atherosclerosis of the carotid siphons. SKULL: No skull fracture. Moderate LEFT frontoparietal scalp hematomas. No subcutaneous gas or radiopaque foreign bodies. LEFT facial and periorbital soft tissue swelling compatible with contusion. SINUSES/ORBITS: Mild paranasal sinus mucosal thickening. Mastoid air cells are well aerated. Status post bilateral ocular lens implants. The included ocular globes and orbital contents are non-suspicious. OTHER: None. IMPRESSION: Multiple scalp hematomas and LEFT facial contusion. No skull fracture. No acute intracranial process ; negative stable examination for age. Electronically Signed   By: Elon Alas M.D.   On: 02/19/2016 00:46    EKG: Independently reviewed. A. fib rate controlled. Nonspecific ECG changes.  Assessment/Plan Active Problems:   Essential hypertension   COPD (chronic obstructive pulmonary disease) ([place])  Pulmonary hypertension   CKD (chronic kidney  disease), stage III   Coronary artery disease   Chronic cor pulmonale (HCC)   Atrial fibrillation, chronic (HCC)   Obstructive sleep apnea   Acute respiratory failure with hypoxia (HCC)   Cellulitis of lower extremity   Acute bronchitis    1. Acute bronchitis - will continue nebulizer check influenza PCR. 2. Lower extremity cellulitis with chronic lymphedema - I have placed patient on doxycycline. 3. Chronic diastolic CHF and cor pulmonale/severe tricuspid regurgitation with chronic kidney disease stage 3-4 - continue torsemide. Follow intake output and metabolic panel. Last EF measured was 65-70%. 4. CAD - denies any chest pain. Continue statins aspirin and beta blocker. 5. Chronic atrial fibrillation - presently rate controlled. On beta blocker. Not a candidate for anticoagulation secondary to falls. 6. Sleep apnea - CPAP at bedtime. 7. Leukopenia and thrombocytopenia - follow CBC. 8. Follow with scalp hematoma and left periorbital contusion - physical therapy consult.  X-ray pelvis pending.   DVT prophylaxis: SCDs. Code Status: Full code.  Family Communication: Discussed with patient.  Disposition Plan: To be determined.  Consults called: Physical therapy.  Admission status: Observation.    Rise Patience MD [facility] Pager [phone].  If 7PM-7AM, please contact night-coverage [email or web address] Password [email or web address]  02/19/2016, 5:17 AM

## 2016-02-19 NOTE — ED Notes (Signed)
Pt from [facility].  It has no medical staff.

## 2016-02-19 NOTE — Progress Notes (Deleted)
Cardiology Office Note    Date:  02/19/2016   ID:  Victoria Fisher, DOB [birth year], MRN [id number]  PCP:  Haywood Pao, MD  Cardiologist:  ***   No chief complaint on file.   History of Present Illness:  Victoria Fisher is a 82 y.o. female ***    Past Medical History:  Diagnosis Date  . Abnormality of gait 01/30/2015  . Anemia in CKD (chronic kidney disease)   . Anxiety   . Arthritis   . Asthma    On nebulizer  . Atrial fibrillation, chronic ([place])    a. Rate controlled w/ BB. No OAC 2/2 falls and gait instability.  . Bowel obstruction    Caused by scar tissue  . Chronic cor pulmonale (HCC)    Most likely due to COPD (cannot rule out contribution of diastolic LV dysfunction). ECHO 07/30/11 pulmonary artery pressure was estimated to be 50-60% mmHg & there was moderate tricuspid insufficiency.  . Chronic diastolic CHF (congestive heart failure) ([place])    a. Predominantly right heart failure due to cor pulmonale & in turn this is most likely due to untreated OSA;  b. 05/2013 Echo: EF 55-60%, no rwma, mild to mod MR, sev dil LA, mod to sev dil RV w/ reduced systolic fxn, massively dil RA, mod-sev TR, PASP 17mmHg.  [name] Chronic pain syndrome   . Colon cancer ([place])    Carcinoma of sigmoid colon 1984 & 1985.  [name] COPD (chronic obstructive pulmonary disease) ([place])   . Coronary artery disease    a. occluded proximal diagonal artery on angiography 2004  . Depression   . Diverticulosis of colon   . 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 Dyspnea on exertion    Reluctant to take diuretics  . Edema of both legs    Venous Doppler 05/11/12 was normal & no evidence of thrombus or thrombophlebitis. Chronic LE edema related to both cor pulmonale & lymphedema, hyperlipidemia & HTN involving coronary atherosclerosis by cardiac cath in 2004. Cath showed a tiny old occlusion of the optional diagonal branch & also proximal LAD.  [name] Frequent falls    Possibly due to orthostatic hypotension but we have  not seen that on our visits. Recent fall 05/2012 with scalp hematoma, swollen left LE, & black eyes.  . Hyperlipidemia    On a statin. Catheterization 02/12/03 Showed a tiny old occlusion of the optional diagonal branch & also proximal LAD.  [name] Hypertension   . Hypothyroidism 2002   On hormone replacement therapy  . IBS (irritable bowel syndrome)   . Insomnia   . Kidney failure 09/05/2015   Pt states that she went to the doctor on thursday and was told she is in stage 4 kidney failure  . Lung nodule   . Milroy's disease    Lymphatic disorder diagnosed at [facility]  . Obstructive sleep apnea    Polysomnogram 09/25/11 AHI: 53.6/hr overall & 57.1/hr during REM sleep. Poor compliance previously with CPAP  . Pulmonary hypertension    Pullmonary hypertension, chronic cor pulmonale w/pulmonary artery pressures of 50&#x2011; 60 mmHg.  . Tuberculosis     Past Surgical History:  Procedure Laterality Date  . ABDOMINAL HYSTERECTOMY  1983  . APPENDECTOMY  1956  . CARDIAC CATHETERIZATION  02/12/03   Showed a tiny old occlusion of the optional diagonal branch & also proximal LAD.  [name] CHOLECYSTECTOMY  1984  . COLON BIOPSY    . COLON RESECTION     2 times  .  COLONOSCOPY  07/23/10    Current Medications: Facility-Administered Medications Prior to Visit  Medication Dose Route Frequency Provider Last Rate Last Dose  . acetaminophen (TYLENOL) tablet 650 mg  650 mg Oral Q6H PRN Rise Patience, MD       Or  . acetaminophen (TYLENOL) suppository 650 mg  650 mg Rectal Q6H PRN Rise Patience, MD      . albuterol (PROVENTIL) (2.5 MG/3ML) 0.083% nebulizer solution 2.5 mg  2.5 mg Nebulization Q4H PRN Rise Patience, MD      . aspirin EC tablet 81 mg  81 mg Oral Daily Rise Patience, MD      . atorvastatin (LIPITOR) tablet 40 mg  40 mg Oral QPC supper Rise Patience, MD      . calcitRIOL (ROCALTROL) capsule 0.25 mcg  0.25 mcg Oral Daily Rise Patience, MD      . cholecalciferol  (VITAMIN D) tablet 2,000 Units  2,000 Units Oral Daily Rise Patience, MD      . Derrill Memo ON 02/21/2016] Darbepoetin Alfa (ARANESP) injection 100 mcg  100 mcg Subcutaneous Q [id number] Rise Patience, MD      . doxycycline (VIBRAMYCIN) 100 mg in dextrose 5 % 250 mL IVPB  100 mg Intravenous Q12H Rise Patience, MD      . ferrous sulfate tablet 325 mg  325 mg Oral QPC supper Rise Patience, MD      . ipratropium-albuterol (DUONEB) 0.5-2.5 (3) MG/3ML nebulizer solution 3 mL  3 mL Nebulization Q4H Rise Patience, MD      . ipratropium-albuterol (DUONEB) 0.5-2.5 (3) MG/3ML nebulizer solution 3 mL  3 mL Nebulization Q2H PRN Rise Patience, MD      . levothyroxine (SYNTHROID, LEVOTHROID) tablet 112 mcg  112 mcg Oral q morning - 10a Rise Patience, MD      . magnesium oxide (MAG-OX) tablet 400 mg  400 mg Oral Daily Rise Patience, MD      . metolazone (ZAROXOLYN) tablet 5 mg  5 mg Oral Q M,W,F Rise Patience, MD      . metoprolol succinate (TOPROL-XL) 24 hr tablet 50 mg  50 mg Oral Daily Rise Patience, MD      . morphine (MSIR) tablet 15 mg  15 mg Oral TID PRN Rise Patience, MD      . nitroGLYCERIN (NITROSTAT) SL tablet 0.4 mg  0.4 mg Sublingual Q5 min PRN Rise Patience, MD      . ondansetron [facility]) tablet 4 mg  4 mg Oral Q6H PRN Rise Patience, MD       Or  . ondansetron (ZOFRAN) injection 4 mg  4 mg Intravenous Q6H PRN Rise Patience, MD      . polyethylene glycol (MIRALAX / GLYCOLAX) packet 17 g  17 g Oral BID PRN Rise Patience, MD      . potassium chloride SA (K-DUR,KLOR-CON) CR tablet 60 mEq  60 mEq Oral BID Rise Patience, MD      . senna [facility]) tablet 17.2-25.8 mg  2-3 tablet Oral Daily PRN Rise Patience, MD      . sertraline (ZOLOFT) tablet 100 mg  100 mg Oral Daily Rise Patience, MD      . torsemide (DEMADEX) tablet 100 mg  100 mg Oral BID Rise Patience, MD      . vitamin E capsule 400 Units  400  Units Oral Daily Rise Patience, MD  Outpatient Medications Prior to Visit  Medication Sig Dispense Refill  . aspirin EC 81 MG tablet Take 81 mg by mouth daily.     [name] atorvastatin (LIPITOR) 40 MG tablet Take 40 mg by mouth daily after supper.     . calcitRIOL (ROCALTROL) 0.25 MCG capsule Take 1 capsule (0.25 mcg total) by mouth daily. 30 capsule 1  . Cholecalciferol (VITAMIN D) 2000 units CAPS Take 2,000 Units by mouth daily.    . Darbepoetin Alfa (ARANESP) 100 MCG/0.5ML SOSY injection Inject 0.5 mLs (100 mcg total) into the skin every Friday at 6 PM. 4.2 mL 0  . ferrous sulfate 325 (65 FE) MG tablet Take 325 mg by mouth daily after supper.     . levalbuterol (XOPENEX HFA) 45 MCG/ACT inhaler Inhale 1 puff into the lungs every 4 (four) hours as needed for wheezing or shortness of breath.     . levothyroxine (SYNTHROID, LEVOTHROID) 112 MCG tablet Take 1 tablet (112 mcg total) by mouth every morning. 30 tablet 6  . magnesium oxide (MAG-OX) 400 (241.3 MG) MG tablet Take 1 tablet (400 mg total) by mouth daily. 30 tablet 5  . metolazone (ZAROXOLYN) 5 MG tablet Take 5 mg (1 tablet) once daily on Monday Wednesday and Friday only 45 tablet 3  . metoprolol succinate (TOPROL-XL) 50 MG 24 hr tablet Take 50 mg by mouth daily. Take with or immediately following a meal.    . morphine (MS CONTIN) 30 MG 12 hr tablet Take 1 tablet (30 mg total) by mouth 2 (two) times daily. 8 tablet 0  . morphine (MSIR) 15 MG tablet Take 1 tablet (15 mg total) by mouth 3 (three) times daily as needed (breakthrough pain). 8 tablet 0  . nitroGLYCERIN (NITROSTAT) 0.4 MG SL tablet Place 1 tablet (0.4 mg total) under the tongue every 5 (five) minutes as needed for chest pain. 100 tablet 1  . polyethylene glycol (MIRALAX / GLYCOLAX) packet Take 17 g by mouth 2 (two) times daily as needed (constipation). Mix in 8 oz liquid and drink    . potassium chloride SA (K-DUR,KLOR-CON) 20 MEQ tablet TAKE 3 TABLETS BY MOUTH TWICE DAILY.  TAKE 1 TABLET EXTRA ON MONDAY, WEDNESDAY, AND FRIDAYS WITH METOLAZONE. 270 tablet 3  . senna (SENOKOT) 8.6 MG TABS tablet Take 2-3 tablets by mouth daily as needed for mild constipation.     . sertraline (ZOLOFT) 100 MG tablet Take 100 mg by mouth daily.    [name] torsemide (DEMADEX) 100 MG tablet Take 1 tablet (100 mg total) by mouth 2 (two) times daily. 60 tablet 11  . vitamin E 400 UNIT capsule Take 400 Units by mouth daily.       Allergies:   Codeine; Gabapentin; Other; Ciprofloxacin; Latex; Penicillins; and Sulfonamide derivatives   Social History   Social History  . Marital status: Married    Spouse name: Georgiann Mccoy  . Number of children: 3  . Years of education: HS   Occupational History  .  Retired   Social History Main Topics  . Smoking status: Never Smoker  . Smokeless tobacco: Never Used  . Alcohol use No  . Drug use: No  . Sexual activity: No   Other Topics Concern  . Not on file   Social History Narrative   Patient is married Georgiann Mccoy) and lives at home with her husband.   Patient is retired.   Patient has three children and her husband has two children.   Patient is right-handed.   Patient  drinks very little caffeine (1/2 cup daily)   Patient has a high school education.     Family History:  The patient's ***family history includes Alzheimer's disease in her mother; Diabetes in her brother; Heart disease in her father; Parkinsonism in her brother.   ROS:   Please see the history of present illness.    ROS All other systems reviewed and are negative.   PHYSICAL EXAM:   VS:  There were no vitals taken for this visit.   GEN: Well nourished, well developed, in no acute distress HEENT: normal Neck: no JVD, carotid bruits, or masses Cardiac: ***RRR; no murmurs, rubs, or gallops,no edema  Respiratory:  clear to auscultation bilaterally, normal work of breathing GI: soft, nontender, nondistended, + BS MS: no deformity or atrophy Skin: warm and dry, no rash Neuro:   Alert and Oriented x 3, Strength and sensation are intact Psych: euthymic mood, full affect  Wt Readings from Last 3 Encounters:  02/18/16 165 lb (74.8 kg)  02/05/16 165 lb 8 oz (75.1 kg)  01/31/16 164 lb (74.4 kg)      Studies/Labs Reviewed:   EKG:  EKG is*** ordered today.  The ekg ordered today demonstrates ***  Recent Labs: 12/18/2015: Magnesium 2.5 02/18/2016: B Natriuretic Peptide 344.5 02/19/2016: ALT 15; BUN 75; Creatinine, Ser 1.57; Hemoglobin 12.0; Platelets 90; Potassium 2.8; Sodium 138; TSH 12.767   Lipid Panel    Component Value Date/Time   CHOL 59 09/10/2015 0403   TRIG 22 09/10/2015 0403   HDL 32 (L) 09/10/2015 0403   CHOLHDL 1.8 09/10/2015 0403   VLDL 4 09/10/2015 0403   LDLCALC 23 09/10/2015 0403    Additional studies/ records that were reviewed today include:  ***    ASSESSMENT:    No diagnosis found.   PLAN:  In order of problems listed above:  1. ***    Medication Adjustments/Labs and Tests Ordered: Current medicines are reviewed at length with the patient today.  Concerns regarding medicines are outlined above.  Medication changes, Labs and Tests ordered today are listed in the Patient Instructions below. There are no Patient Instructions on file for this visit.   Hilbert Corrigan, [place]  02/19/2016 6:46 AM    [address], [place], [place]  [address] Phone: [phone]; Fax: [phone]

## 2016-02-19 NOTE — Progress Notes (Signed)
CSW engaged with Patient's caregiver, [name], [phone], in [place] office. Per Mrs. Arlyce Dice, she is unable to provide her services any longer due to Patient and spouse's need for higher level of care and due to liability concerns. Patient's caregiver reports that Patient's APS worker is Ernest Pine [phone]. CSW contacted Mrs. Joya Gaskins and left HIPAA complaint voice message requesting return phone call as soon as possible.    Lorrine Kin, MSW, LCSW [facility] ED/38M Clinical Social Worker [phone]

## 2016-02-19 NOTE — Patient Outreach (Signed)
    Telephone call received from [place], patient's primary caregiver. Cindy asked if a Nurse would be coming out today. This RNCM Lenis Noon a visit was not scheduled for today. This RNCM asked Jenny Reichmann if there was an issue. Jenny Reichmann stated,, I don't know what to do with this lady." This RNCM asked for clarification on her need. Jenny Reichmann stated further she was having a hard time waking the patient. This RNCM advised Jenny Reichmann to active 911, Jenny Reichmann stated she hated to leave patient's husband alone. Jenny Reichmann stated patient was breathing without difficulty.  Jenny Reichmann stated she was going to call [facility] to see if they could send a Nurse out today.  Plan: Telephone call with patient's son to discuss concerns related to safety issues

## 2016-02-19 NOTE — Evaluation (Addendum)
Physical Therapy Evaluation Patient Details Name: Victoria Fisher MRN: [id number] DOB: 04-Sep-1934 Today's Date: 02/19/2016   History of Present Illness  82 y.o. female who came to [facility] on 02/18/16 due to weakness and frequent falls.  Pt with increased SOB, (+) for flu.  Pt with significant PMHx of TB, Pulmonary HTN, milroy's disease (lymphatic disorder), kidney failure, frequent falls, bil LE edema, CAD, COPD, colon CA s/p resection, chronic diastolic CHF, A-fib, and chronic vertigo.   Clinical Impression  I am familiar with this pt from previous admissions to the hospital.  We, once again, are recommending SNF level rehab with hopeful transition to ALF level of care.  Pt has refused SNF in the past because she doesn't want to leave her husband.  I think, this admission, she is realizing that they are both not functioning well in their current environment and I am hopeful, if we can get them both placed in the same facility, that she will agree to placement this time.  She is not at all safe to return to independent living.   PT to follow acutely for deficits listed below.       Follow Up Recommendations SNF;Supervision/Assistance - 24 hour    Equipment Recommendations  None recommended by PT    Recommendations for Other Services   NA   Precautions / Restrictions Precautions Precautions: Fall      Mobility  Bed Mobility Overal bed mobility: Needs Assistance Bed Mobility: Supine to Sit;Sit to Supine     Supine to sit: Mod assist;HOB elevated Sit to supine: Mod assist   General bed mobility comments: Mod assist to help support trunk and progress right leg over EOB.  Mod assist of both legs to return to supine.  Pt letting gravity control her trunk's descent down to the bed.   Transfers Overall transfer level: Needs assistance Equipment used: 4-wheeled walker Transfers: Sit to/from Stand Sit to Stand: Mod assist;From elevated surface         General transfer comment: One person  mod assist to get to standing EOB.  She was able to stand 2 times for less than a minute each time and had significant difficuty getting back into high bed/gurney.  She was unable to attempt to ambulate at this time due to weakness and right LE pain.   Ambulation/Gait             General Gait Details: unable at this time.       Balance Overall balance assessment: Needs assistance Sitting-balance support: Feet supported;Bilateral upper extremity supported Sitting balance-Leahy Scale: Poor Sitting balance - Comments: min assist EOB for balance.   Postural control: Posterior lean Standing balance support: Bilateral upper extremity supported Standing balance-Leahy Scale: Poor Standing balance comment: Mod assist in standing with the support of the 4 wheeled RW with breaks locked.  Pt with posterior left preference in standing.   She could stnad for <1 min each trial.                              Pertinent Vitals/Pain Pain Assessment: Faces Faces Pain Scale: Hurts even more Pain Location: right lower leg to touch and with movement Pain Descriptors / Indicators: Burning;Aching Pain Intervention(s): Limited activity within patient's tolerance;Monitored during session;Repositioned    Home Living Family/patient expects to be discharged to:: Private residence Living Arrangements: Spouse/significant other;Other (Comment) (with alzheimer's dementia) Available Help at Discharge: Personal care attendant;Available PRN/intermittently Type of Home: Independent  living facility Home Access: Level entry     Home Layout: One level Home Equipment: [place] - 2 wheels;Cane - single point;Grab bars - toilet;Grab bars - tub/shower;Shower seat      Prior Function Level of Independence: Independent with assistive device(s);Needs assistance   Gait / Transfers Assistance Needed: walks with a rollator, but per ED notes she has been falling frequently, has a bruise on her left orbit.             Hand Dominance   Dominant Hand: Right    Extremity/Trunk Assessment   Upper Extremity Assessment Upper Extremity Assessment: Generalized weakness    Lower Extremity Assessment Lower Extremity Assessment: Generalized weakness;RLE deficits/detail;LLE deficits/detail RLE Deficits / Details: bil LEs weak, pt's right leg is weaker due to pain with movement and WB.  She was able to stand with the support of therapist and 4-wheeled RW for < 1 minute.   RLE Sensation: history of peripheral neuropathy LLE Deficits / Details: left leg she can lift weakly against gravity, but it is edematous and wrapped (per pt for cellulitis), this leg is stronger than her right leg, but still weak overall.  LLE Sensation: history of peripheral neuropathy    Cervical / Trunk Assessment Cervical / Trunk Assessment: Kyphotic;Other exceptions Cervical / Trunk Exceptions: Pt with severe kyphosis and her neck and chin almost touch her chest.  She normally wears a soft collar for comfort, but reports it was left at home.   Communication   Communication: No difficulties  Cognition Arousal/Alertness: Awake/alert Behavior During Therapy: WFL for tasks assessed/performed Overall Cognitive Status: No family/caregiver present to determine baseline cognitive functioning (not specifically tested)                             Assessment/Plan    PT Assessment Patient needs continued PT services  PT Problem List Decreased strength;Decreased activity tolerance;Decreased balance;Decreased mobility;Decreased range of motion;Decreased knowledge of use of DME;Decreased safety awareness;Decreased knowledge of precautions;Pain;Decreased skin integrity;Impaired sensation;Cardiopulmonary status limiting activity          PT Treatment Interventions DME instruction;Gait training;Functional mobility training;Therapeutic activities;Therapeutic exercise;Balance training;Patient/family education    PT Goals  (Current goals can be found in the Care Plan section)  Acute Rehab PT Goals Patient Stated Goal: pt wants to return home.  I discussed the fact that she was not functioning well in her current situation.  I think she has resigned to the fact that she cannot return to an independent apartment at this time.   PT Goal Formulation: With patient Time For Goal Achievement: 03/04/16 Potential to Achieve Goals: Fair    Frequency Min 3X/week   Barriers to discharge Decreased caregiver support she is currently in an independent apartment and is not functioning well in this level of care.  She is not safe to return home.  She will need SNF placement for rehab with hopeful transition to ALF.        End of Session Equipment Utilized During Treatment: Gait belt Activity Tolerance: Patient limited by pain;Other (comment) (limited by 2-3/4 DOE with mobility O2 sats 95% ) Patient left: in bed;with call bell/phone within reach           Time: [id number] PT Time Calculation (min) (ACUTE ONLY): 34 min   Charges:   PT Evaluation $PT Eval Moderate Complexity: 1 Procedure PT Treatments $Therapeutic Activity: 8-22 mins        Jennifer Holland [address], [place], DPT [phone]  02/19/2016, 5:03 PM

## 2016-02-19 NOTE — ED Notes (Signed)
Dr Raliegh Ip aware of Trop 0.04

## 2016-02-19 NOTE — ED Notes (Signed)
[facility] to find out what the pt baseline is.  Receptionist informed me it is independent living and they do not keep those records.  Was given the phone number of pt's son [phone] Thomasenia Bottoms.  Number did not work for this Therapist, sports.

## 2016-02-19 NOTE — Care Management Note (Signed)
Case Management Note  Patient Details  Name: CHRISTEL BIELEFELDT MRN: [id number] Date of Birth: June 05, 1934  Subjective/Objective:                  From [place] with [place]. /81 y.o. female with history of diastolic CHF, cor pulmonale, hypertension, chronic lymphedema CAD chronic kidney disease A. fib presents to the ER because of shortness of breath and productive cough.  Action/Plan: Follow for disposition needs. /Admit to INPATIENT; possible discharge to Assisted Living.   Expected Discharge Date:  02/22/16               Expected Discharge Plan:   (Independent Living)  In-House Referral:  Clinical Social Work  Discharge planning Services  CM Consult  Post Acute Care Choice:  NA Choice offered to:  NA  DME Arranged:  N/A DME Agency:  NA  HH Arranged:    HH Agency:  [facility]  Status of Service:  In process, will continue to follow  If discussed at Long Length of Stay Meetings, dates discussed:    Additional Comments: Pt and husband reside at [place]. APS has been filed through [employer] regarding pt and husband not safe at home.  Please see Social Work note for details on conversation regarding living conditions.  In the event pt and husband are discharged home- pt currently active with [facility].  Fuller Mandril, RN 02/19/2016, 9:29 AM

## 2016-02-19 NOTE — Progress Notes (Signed)
Agree with the documentation and history and physical from Dr. Hal Hope  82 year old female with  permanent atrial fibrillation, [place] score >4 not on anticoagulation Milroy's disease [lymphatic disorder] Hypothyroidism Diastolic heart failure ef 65-70%/PASP 34 mm hg  -Recent admission 01/09/2016 for exacerbation of this.  Discharge weight 68.5--wght this admit 74 kg Coronary artery disease CO2 edema Chronic kidney disease stage III Lymphedema  FLU + with bronchitis and some volme overload  Tired, L scalp hematoma She feels better but is still wheezy at bedside States she also feels more swollen than usual She asks for MSIR 30 mg which is home dose which is reasonable if mentation is clear No cp I did not examine her feet and will need to assess them in am   We will keep the patient as Inpatient but change bed to med-surg--she has maintained within the high 90 to 100 range HR but no arrhythmias We will admit her next to her husband Mr Sypher and room them together if possible in a room  Expect will nee at least 2 midnights for this to reslve in terms of her Heart failure and her FLu  Verneita Griffes, MD [facility] [phone]

## 2016-02-19 NOTE — Discharge Planning (Signed)
Pt APS case worker is Ernest Pine [phone].

## 2016-02-19 NOTE — ED Notes (Signed)
Trop 0.04

## 2016-02-20 ENCOUNTER — Other Ambulatory Visit: Payer: Self-pay

## 2016-02-20 ENCOUNTER — Inpatient Hospital Stay (HOSPITAL_COMMUNITY): Payer: Medicare Other

## 2016-02-20 LAB — CBC
HEMATOCRIT: 38.9 % (ref 36.0–46.0)
HEMOGLOBIN: 12 g/dL (ref 12.0–15.0)
MCH: 32.2 pg (ref 26.0–34.0)
MCHC: 30.8 g/dL (ref 30.0–36.0)
MCV: 104.3 fL — ABNORMAL HIGH (ref 78.0–100.0)
Platelets: 87 10*3/uL — ABNORMAL LOW (ref 150–400)
RBC: 3.73 MIL/uL — ABNORMAL LOW (ref 3.87–5.11)
RDW: 16.6 % — ABNORMAL HIGH (ref 11.5–15.5)
WBC: 6.5 10*3/uL (ref 4.0–10.5)

## 2016-02-20 LAB — URINE CULTURE: Culture: <10000 — AB

## 2016-02-20 LAB — BASIC METABOLIC PANEL
Anion gap: 11 (ref 5–15)
BUN: 83 mg/dL — ABNORMAL HIGH (ref 6–20)
CHLORIDE: 97 mmol/L — AB (ref 101–111)
CO2: 28 mmol/L (ref 22–32)
Calcium: 9 mg/dL (ref 8.9–10.3)
Creatinine, Ser: 1.58 mg/dL — ABNORMAL HIGH (ref 0.44–1.00)
GFR calc Af Amer: 35 mL/min — ABNORMAL LOW (ref >60)
GFR, EST NON AFRICAN AMERICAN: 30 mL/min — AB (ref >60)
GLUCOSE: 102 mg/dL — AB (ref 65–99)
POTASSIUM: 4.2 mmol/L (ref 3.5–5.1)
Sodium: 136 mmol/L (ref 135–145)

## 2016-02-20 MED ORDER — CLINDAMYCIN PHOSPHATE 600 MG/50ML IV SOLN
600.0000 mg | Freq: Three times a day (TID) | INTRAVENOUS | Status: DC
  Administered 2016-02-20 – 2016-02-21 (×2): 600 mg via INTRAVENOUS
  Filled 2016-02-20 (×5): qty 50

## 2016-02-20 NOTE — Progress Notes (Signed)
Patient used incentive spirometer x6 while RN was in room. Productive cough that produces thick tan. Patient turned to side, coughed and repositioned to right side, HOB at 55. Patient on phone with husband.

## 2016-02-20 NOTE — Progress Notes (Addendum)
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  Discharge weight 68.5--wght this admit 74 kg Coronary artery disease CO2 edema Chronic kidney disease stage III Lymphedema  FLU + with bronchitis and some volme overload   Assessment & Plan:   Principal Problem:   Acute respiratory failure with hypoxia (HCC) Active Problems:   Essential hypertension   COPD (chronic obstructive pulmonary disease) (HCC)   Pulmonary hypertension   CKD (chronic kidney disease), stage III   Coronary artery disease   Chronic cor pulmonale (HCC)   Atrial fibrillation, chronic (HCC)   Obstructive sleep apnea   Cellulitis of lower extremity   Acute bronchitis   Bronchitis   Influenza A positive-Tamiflu, change doxycycline--> Zosyn based on get 2 vw CXR showing left lower lobe pneumonia. Repeat CBC plus differential in a.m. Recent falls, left scalp hematoma-PT recommends skilled placement-d/w patient-considering this as option Permanent atrial fibrillation [place] score >4 not on anticoagulation secondary to falls-continue Toprol-XL 50 daily  Chr Diastolic HF-resume torsemide 100 3 times a day-weight on admission was 74 kg at last admission was 68.5. Strict ins and outs. Close monitoring Milroy disease-lymphatic disorder-platelets down to 87 from 109 on 12/20. Monitor carefully. We'll place SCDs and hold off on Lovenox Depression-continue sertraline 100 daily Hypothyroidism continue Synthroid 112 every morning CAD Chronic lymphedema Frequent falls   DVT prophylaxis: SCD's* Code Status: Full Family Communication: none Disposition Plan: ? Unclear-might decide  on SNF   Consultants:   none  Procedures:   none  Antimicrobials:   tamiflu 1/3  Doxycylcine 1/3    Subjective:  Coughing still  producing sputum and not really much improved   Objective: Vitals:   02/19/16 2158 02/20/16 0151 02/20/16 0559 02/20/16 0908  BP:  100/67 (!) 117/59 102/62  Pulse: 87 81 84 80  Resp: 20 18 16 16   Temp:  98.6 F (37 C) 98.2 F (36.8 C) 98.9 F (37.2 C)  TempSrc:  Oral Oral Oral  SpO2: 100% 94% 94% 96%  Weight:      Height:       No intake or output data in the 24 hours ending 02/20/16 1105 Filed Weights   02/18/16 2158 02/19/16 2053  Weight: 74.8 kg (165 lb) 71.3 kg (157 lb 3.2 oz)    Examination:  General exam: Appears calm And some rest her distress Respiratory system: Clear to auscultation. Respiratory effort slightly increased and has crackles bilaterally Cardiovascular system: S1 & S2 heard, RRR. No JVD, murmurs, rubs, gallops or clicks. No pedal edema. Gastrointestinal system: Abdomen is nondistended, soft and nontender. No organomegaly or masses felt. Normal bowel sounds heard. Central nervous system: Alert and oriented. No focal neurological deficits. Extremities: Symmetric 5 x 5 power. Skin: No rashes, lesions or ulcers Psychiatry: Judgement and insight appear normal. Mood & affect appropriate.     Data Reviewed: I have personally reviewed following labs and imaging studies  CBC:  Recent Labs Lab 02/18/16 2220 02/19/16 0533 02/20/16 0649  WBC 3.8* 3.7* 6.5  NEUTROABS 2.6 2.3  --   HGB 12.8 12.0 12.0  HCT 39.0 37.4 38.9  MCV 101.0* 100.3* 104.3*  PLT 73* 90*  87*   Basic Metabolic Panel:  Recent Labs Lab 02/18/16 2220 02/19/16 0533 02/20/16 0649  NA 135 138 136  K 3.0* 2.8* 4.2  CL 91* 94* 97*  CO2 25 29 28   GLUCOSE 80 81 102*  BUN 75* 75* 83*  CREATININE 1.61* 1.57* 1.58*  CALCIUM 9.8 9.4 9.0   GFR: Estimated Creatinine Clearance: 27.9 mL/min (by C-G formula based on SCr of 1.58 mg/dL  (H)). Liver Function Tests:  Recent Labs Lab 02/18/16 2220 02/19/16 0533  AST 50* 54*  ALT 16 15  ALKPHOS 70 63  BILITOT 1.4* 1.5*  PROT 7.2 6.5  ALBUMIN 3.3* 3.1*   No results for input(s): LIPASE, AMYLASE in the last 168 hours. No results for input(s): AMMONIA in the last 168 hours. Coagulation Profile: No results for input(s): INR, PROTIME in the last 168 hours. Cardiac Enzymes:  Recent Labs Lab 02/19/16 0533 02/19/16 1226 02/19/16 1840  TROPONINI 0.04* 0.04* 0.04*   BNP (last 3 results) No results for input(s): PROBNP in the last 8760 hours. HbA1C: No results for input(s): HGBA1C in the last 72 hours. CBG: No results for input(s): GLUCAP in the last 168 hours. Lipid Profile: No results for input(s): CHOL, HDL, LDLCALC, TRIG, CHOLHDL, LDLDIRECT in the last 72 hours. Thyroid Function Tests:  Recent Labs  02/19/16 0533  TSH 12.767*   Anemia Panel: No results for input(s): VITAMINB12, FOLATE, FERRITIN, TIBC, IRON, RETICCTPCT in the last 72 hours. Sepsis Labs:  Recent Labs Lab 02/19/16 0210  LATICACIDVEN 1.10    Recent Results (from the past 240 hour(s))  Urine culture     Status: Abnormal   Collection Time: 02/18/16 11:58 PM  Result Value Ref Range Status   Specimen Description URINE, CATHETERIZED  Final   Special Requests NONE  Final   Culture <10,000 COLONIES/mL INSIGNIFICANT GROWTH (A)  Final   Report Status 02/20/2016 FINAL  Final         Radiology Studies: Dg Chest 2 View  Result Date: 02/18/2016 CLINICAL DATA:  Fever and flu like symptoms EXAM: CHEST  2 VIEW COMPARISON:  02/05/2016 FINDINGS: Moderate cardiomegaly with globular cardiac configuration as before. Atherosclerosis of the aorta. No acute consolidation or effusion. No pneumothorax. IMPRESSION: 1. Moderate globular enlargement of the cardiac silhouette without overt failure 2. No acute infiltrates Electronically Signed   By: Donavan Foil M.D.   On: 02/18/2016 22:56   Ct Head Wo  Contrast  Result Date: 02/19/2016 CLINICAL DATA:  Wheezing, fever. History of hypertension, frequent falls, colon cancer. EXAM: CT HEAD WITHOUT CONTRAST TECHNIQUE: Contiguous axial images were obtained from the base of the skull through the vertex without intravenous contrast. COMPARISON:  CT HEAD January 31, 2016 FINDINGS: BRAIN: The ventricles and sulci are normal for age. No intraparenchymal hemorrhage, mass effect nor midline shift. Patchy supratentorial white matter hypodensities less than expected for patient's age, though non-specific are most compatible with chronic small vessel ischemic disease. No acute large vascular territory infarcts. No abnormal extra-axial fluid collections. Basal cisterns are patent. VASCULAR: Mild to moderate calcific atherosclerosis of the carotid siphons. SKULL: No skull fracture. Moderate LEFT frontoparietal scalp hematomas. No subcutaneous gas or radiopaque foreign bodies. LEFT facial and periorbital soft tissue swelling compatible with contusion. SINUSES/ORBITS: Mild paranasal sinus mucosal thickening. Mastoid air cells are well aerated. Status post bilateral ocular lens implants. The included ocular globes and orbital contents are non-suspicious. OTHER: None. IMPRESSION: Multiple scalp hematomas and LEFT facial contusion. No skull fracture. No acute intracranial process ; negative stable  examination for age. Electronically Signed   By: Elon Alas M.D.   On: 02/19/2016 00:46   Dg Pelvis Portable  Result Date: 02/19/2016 CLINICAL DATA:  RIGHT leg pain, recent fall EXAM: PORTABLE PELVIS 1-2 VIEWS COMPARISON:  Portable exam 1008 hours compared to 11/18/2014 FINDINGS: Rotated to the LEFT. Osseous demineralization. Hip joints symmetric and preserved. No definite fracture, dislocation, or bone destruction. IMPRESSION: Osseous demineralization without acute bony abnormalities identified on rotated exam. Electronically Signed   By: Lavonia Dana M.D.   On: 02/19/2016 10:21         Scheduled Meds: . aspirin EC  81 mg Oral Daily  . atorvastatin  40 mg Oral QPC supper  . calcitRIOL  0.25 mcg Oral Daily  . cholecalciferol  2,000 Units Oral Daily  . [START ON 02/21/2016] Darbepoetin Alfa  100 mcg Subcutaneous Q Fri-1800  . doxycycline (VIBRAMYCIN) IV  100 mg Intravenous Q12H  . ferrous sulfate  325 mg Oral QPC supper  . ipratropium-albuterol  3 mL Nebulization QID  . levothyroxine  112 mcg Oral QAC breakfast  . magnesium oxide  400 mg Oral Daily  . metolazone  5 mg Oral Q M,W,F  . metoprolol succinate  50 mg Oral Daily  . oseltamivir  30 mg Oral Daily  . potassium chloride SA  60 mEq Oral BID  . sertraline  100 mg Oral Daily  . torsemide  100 mg Oral BID  . vitamin E  400 Units Oral Daily   Continuous Infusions:   LOS: 1 day    Time spent: [place], Mullan, MD [facility] Pager [phone]  If 7PM-7AM, please contact night-coverage [email or web address] Password [email or web address] 02/20/2016, 11:05 AM

## 2016-02-20 NOTE — Patient Outreach (Signed)
[place] ([place]) Care Management  02/20/2016  SAFA KOWNACKI 29-May-1934 [id number]   This RN CM received call from patent's son, [name]. Mr. Alease Medina advised this RN [place] [facility] request assistance in getting a FL 2 done. RN CM advised Mr. Alease Medina since patient is in the acute care setting, the inpatient case manager would be assisting with getting the FL completed. Mr. Alease Medina also requested a psychiatric evaluation while inpatient because he feels his mother is manipulating everybody, feels she is giving his step father the wrong medications, and taking his and her pain medications.    Mr. Alease Medina states he feels his mother and step father need to be placed in long term care, stated further his step father is also now in acute care. Mr. Alease Medina advises his mother's private pay attendant has quit, leaving the couple without care in their apartment.  This RN CM made a telephone call, advised [facility]ia[facility], of the above. [place] stated she would relay the information to the inpatient case management. [place] advised patient lives in an independent apartment setting, has medical alert but lacks 24 hour care. [place] also advised the apartment complex management has expressed concerns related to safety.

## 2016-02-20 NOTE — Progress Notes (Addendum)
Patient instructed on how to use incentive spirometer, patient demonstrated correct use of incentive spirometer reaching 150. Patient coughed after performing. Understands to use frequently, at least 10/hr. Instructed patient to cough and deep breath. HOB elevated to 60 degrees.

## 2016-02-20 NOTE — Progress Notes (Addendum)
Patient used incentive spirometer 8 times while rn was in room reaching 150. Turned to left side. Patient using yankhauer to suction mouth. Patient swabbed mouth with medline oral rinse. HOB at 26

## 2016-02-20 NOTE — Progress Notes (Signed)
Pharmacy Antibiotic Note  Victoria Fisher is a 82 y.o. female admitted on 02/18/2016 with shortness of breath/ Acute bronchitis , +influenza A , Chr Diastolic HF.   Pharmacy was consulted today 02/20/16 for Zosyn dosing for CAP. Patient has allergy history to PCN : hives/severe rash.  I discussed this with Dr. Verlon Au who has canceled the Zosyn and wants to start IV Clindamycin .  Pt also allergic to ciprofloxacin.   Plan: Clindamycin 600 mg IV q8h  Pharmacy will sign off/ there are no adjustments of clindamycin necessary for renal function changes.    Height: 5\' 4"  (162.6 cm) Weight: 157 lb 3.2 oz (71.3 kg) IBW/kg (Calculated) : 54.7  Temp (24hrs), Avg:98.6 F (37 C), Min:98.2 F (36.8 C), Max:98.9 F (37.2 C)   Recent Labs Lab 02/18/16 2220 02/19/16 0210 02/19/16 0533 02/20/16 0649  WBC 3.8*  --  3.7* 6.5  CREATININE 1.61*  --  1.57* 1.58*  LATICACIDVEN  --  1.10  --   --     Estimated Creatinine Clearance: 27.9 mL/min (by C-G formula based on SCr of 1.58 mg/dL (H)).    Allergies  Allergen Reactions  . Codeine Anaphylaxis, Hives and Nausea And Vomiting  . Gabapentin Swelling and Other (See Comments)    Hallucinations, severe facial swelling   . Other Other (See Comments)    Will not accept blood or blood products - patient is Jehovah's Witness  . Ciprofloxacin Hives and Nausea And Vomiting  . Latex Hives and Rash  . Penicillins Hives    Has patient had a PCN reaction causing immediate rash, facial/tongue/throat swelling, SOB or lightheadedness with hypotension: Yes Has patient had a PCN reaction causing severe rash involving mucus membranes or skin necrosis: Yes Has patient had a PCN reaction that required hospitalization Yes Has patient had a PCN reaction occurring within the last 10 years: Yes If all of the above answers are "NO", then may proceed with Cephalosporin use.  . Sulfonamide Derivatives Hives and Rash    Antimicrobials this admission: 1/3 doxycycline  >>1/4 1/3 Tamiflu>> (1/8) 1/4 Clindamycin >>   Dose adjustments this admission: n/a  Microbiology results: 1/2UCx:  insignificant growth   Thank you for allowing pharmacy to be a part of this patient's care. Nicole Cella, RPh Clinical Pharmacist [id number]-[id number] [place] [phone] 02/20/2016 2:45 PM

## 2016-02-20 NOTE — Consult Note (Signed)
   [facility] CM Inpatient Consult   02/20/2016  HADY MCFALLS [birth year] [id number]   Call received from [facility] regarding ongoing issues with this patient who has re-admitted.  Patient has many social issues, lives at [place] with her husband who is currently admitted to the hospital as well.  They have a son live who lives in [place], [place]. Patient is active with [facility] Management.  Please see [facility] RNCM notes and social workers notes for specific details of ongoing issues and disposition needs. For questions, please contact:   Natividad Brood, RN BSN [facility] Liaison  [phone] business mobile phone Toll free office [phone]

## 2016-02-20 NOTE — Progress Notes (Signed)
Received via stretcher from ED to 5M20.  Patient incontinent upon arrival, skin cleansed, no breakdown, but body bruising secondary to falls at home.  Patient leans head to left. Unable to fully aliagn to sit upright for medication administration.  Tolerated po meds with applesauce.  Oriented to room, safety precautions, droplet precautions & plan of care.  Patient also notified of husbands location on [address].  On call notified of telemetry order not appropriate for floor, central monitoring notified.

## 2016-02-20 NOTE — Progress Notes (Signed)
Patient coughing thick tan sputum, taught to use yankhauer to suction out of mouth. Mouth suctioned and swabbed with medline rinse. Patient performed incentive spirometer x5 while rn was in room. Will continue to monitor.

## 2016-02-21 ENCOUNTER — Telehealth: Payer: Self-pay | Admitting: Cardiovascular Disease

## 2016-02-21 LAB — CBC WITH DIFFERENTIAL/PLATELET
BASOS PCT: 0 %
Basophils Absolute: 0 10*3/uL (ref 0.0–0.1)
EOS PCT: 0 %
Eosinophils Absolute: 0 10*3/uL (ref 0.0–0.7)
HCT: 39 % (ref 36.0–46.0)
Hemoglobin: 12.1 g/dL (ref 12.0–15.0)
LYMPHS ABS: 0.9 10*3/uL (ref 0.7–4.0)
Lymphocytes Relative: 20 %
MCH: 32.3 pg (ref 26.0–34.0)
MCHC: 31 g/dL (ref 30.0–36.0)
MCV: 104 fL — ABNORMAL HIGH (ref 78.0–100.0)
MONO ABS: 0.5 10*3/uL (ref 0.1–1.0)
Monocytes Relative: 10 %
Neutro Abs: 3.3 10*3/uL (ref 1.7–7.7)
Neutrophils Relative %: 70 %
PLATELETS: 83 10*3/uL — AB (ref 150–400)
RBC: 3.75 MIL/uL — ABNORMAL LOW (ref 3.87–5.11)
RDW: 16.7 % — ABNORMAL HIGH (ref 11.5–15.5)
WBC: 4.7 10*3/uL (ref 4.0–10.5)

## 2016-02-21 LAB — BASIC METABOLIC PANEL
Anion gap: 10 (ref 5–15)
BUN: 81 mg/dL — AB (ref 6–20)
CHLORIDE: 97 mmol/L — AB (ref 101–111)
CO2: 32 mmol/L (ref 22–32)
CREATININE: 1.56 mg/dL — AB (ref 0.44–1.00)
Calcium: 9.2 mg/dL (ref 8.9–10.3)
GFR calc Af Amer: 35 mL/min — ABNORMAL LOW (ref >60)
GFR calc non Af Amer: 30 mL/min — ABNORMAL LOW (ref >60)
Glucose, Bld: 97 mg/dL (ref 65–99)
Potassium: 3.6 mmol/L (ref 3.5–5.1)
Sodium: 139 mmol/L (ref 135–145)

## 2016-02-21 MED ORDER — MORPHINE SULFATE 15 MG PO TABS
15.0000 mg | ORAL_TABLET | Freq: Three times a day (TID) | ORAL | Status: DC | PRN
  Administered 2016-02-21 – 2016-02-24 (×6): 15 mg via ORAL
  Filled 2016-02-21 (×6): qty 1

## 2016-02-21 MED ORDER — CEFUROXIME AXETIL 500 MG PO TABS
500.0000 mg | ORAL_TABLET | Freq: Two times a day (BID) | ORAL | Status: DC
  Administered 2016-02-21 – 2016-02-22 (×2): 500 mg via ORAL
  Filled 2016-02-21 (×2): qty 1

## 2016-02-21 MED ORDER — WHITE PETROLATUM GEL
Status: AC
  Administered 2016-02-21: 1
  Filled 2016-02-21: qty 1

## 2016-02-21 MED ORDER — BENZOCAINE 10 % MT GEL
Freq: Two times a day (BID) | OROMUCOSAL | Status: DC | PRN
  Filled 2016-02-21: qty 9.4

## 2016-02-21 MED ORDER — TORSEMIDE 20 MG PO TABS
100.0000 mg | ORAL_TABLET | Freq: Every day | ORAL | Status: DC
  Administered 2016-02-21 – 2016-02-23 (×3): 100 mg via ORAL
  Filled 2016-02-21 (×2): qty 5

## 2016-02-21 MED ORDER — CLINDAMYCIN HCL 300 MG PO CAPS
300.0000 mg | ORAL_CAPSULE | Freq: Three times a day (TID) | ORAL | Status: DC
  Administered 2016-02-21: 300 mg via ORAL
  Filled 2016-02-21 (×2): qty 1

## 2016-02-21 NOTE — Progress Notes (Signed)
Orthopedic Tech Progress Note Patient Details:  Victoria Fisher 11-06-1934 [id number]  Ortho Devices Type of Ortho Device: Louretta Parma boot Ortho Device/Splint Interventions: Application   [place] Pink 02/21/2016, 4:07 PM

## 2016-02-21 NOTE — Progress Notes (Signed)
Physical Therapy Treatment Patient Details Name: Victoria Fisher MRN: [id number] DOB: [birth year] Today's Date: 02/21/2016    History of Present Illness 82 y.o. female who came to [facility] on 02/18/16 due to weakness and frequent falls.  Pt with increased SOB, (+) for flu.  Pt with significant PMHx of TB, Pulmonary HTN, milroy's disease (lymphatic disorder), kidney failure, frequent falls, bil LE edema, CAD, COPD, colon CA s/p resection, chronic diastolic CHF, A-fib, and chronic vertigo.     PT Comments    Pt performed x2 transfers with RW.  Pt fatigues quickly and returns to chair.  Will require SNF placement for rehab to improve strength, likely will need ALF at d/c.  Pt is currently in independent living.    Follow Up Recommendations  SNF;Supervision/Assistance - 24 hour     Equipment Recommendations  None recommended by PT    Recommendations for Other Services       Precautions / Restrictions Precautions Precautions: Fall Restrictions Weight Bearing Restrictions: No    Mobility  Bed Mobility Overal bed mobility: Needs Assistance Bed Mobility: Sit to Supine     Supine to sit: Mod assist;HOB elevated Sit to supine: Mod assist   General bed mobility comments: Pt unable to achieve sitting without assist.  Require assist to advance LEs and elevate trunk into sitting with patient reach for therapist.    Transfers Overall transfer level: Needs assistance Equipment used: Rolling walker (2 wheeled) Transfers: Sit to/from Stand Sit to Stand: Mod assist;From elevated surface         General transfer comment: Cues for sequencing hand placement and assist for eccetric loading.  Pt performed x2 from bed and chair.  Once in seated position patient able to scoot back into recliner chair.    Ambulation/Gait Ambulation/Gait assistance:  (steps to chair with +1 mod assist.  )               Stairs            Wheelchair Mobility    Modified Rankin (Stroke Patients  Only)       Balance Overall balance assessment: Needs assistance   Sitting balance-Leahy Scale: Poor       Standing balance-Leahy Scale: Poor                      Cognition Arousal/Alertness: Awake/alert Behavior During Therapy: WFL for tasks assessed/performed Overall Cognitive Status: No family/caregiver present to determine baseline cognitive functioning                      Exercises      General Comments        Pertinent Vitals/Pain Pain Assessment: Faces Faces Pain Scale: Hurts even more Pain Location: right lower leg to touch and with movement Pain Descriptors / Indicators: Burning;Aching Pain Intervention(s): Monitored during session;Repositioned    Home Living                      Prior Function            PT Goals (current goals can now be found in the care plan section) Acute Rehab PT Goals Patient Stated Goal: To go to rehab and then return to apartment.   Potential to Achieve Goals: Fair Progress towards PT goals: Progressing toward goals    Frequency    Min 3X/week      PT Plan Current plan remains appropriate    Co-evaluation  End of Session Equipment Utilized During Treatment: Gait belt Activity Tolerance: Patient limited by pain;Patient limited by fatigue Patient left: in chair;with call bell/phone within reach;with chair alarm set     Time: [id number] PT Time Calculation (min) (ACUTE ONLY): 20 min  Charges:  $Therapeutic Activity: 8-22 mins                    G Codes:      Cristela Blue 02-23-2016, 5:03 PM  Governor Rooks, PTA pager [phone]

## 2016-02-21 NOTE — Progress Notes (Signed)
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  Discharge weight 68.5--wght this admit 74 kg Coronary artery disease CO2 edema Chronic kidney disease stage III Lymphedema  FLU + with bronchitis and some volme overload   Assessment & Plan:   Principal Problem:   Acute respiratory failure with hypoxia (HCC) Active Problems:   Essential hypertension   COPD (chronic obstructive pulmonary disease) (HCC)   Pulmonary hypertension   CKD (chronic kidney disease), stage III   Coronary artery disease   Chronic cor pulmonale (HCC)   Atrial fibrillation, chronic (HCC)   Obstructive sleep apnea   Cellulitis of lower extremity   Acute bronchitis   Bronchitis   Influenza A positive-Tamiflu, change doxycycline--> Zosyn based on get 2 vw CXR showing left lower lobe pneumonia.  Transition in 2 Augmentin 02/21/16 given stability Recent falls, left scalp hematoma-PT recommends skilled placement-d/w patient-considering this as option Permanent atrial fibrillation [place] score >4 not on anticoagulation secondary to falls-continue Toprol-XL 50 daily  Chr Diastolic HF-resume torsemide 100 3 times a day-weight on admission was 74 kg at last admission was 68.5. Strict ins  and outs. Cut back dose to 100 mg daily for now. Continue Unna boot dressings Milroy disease-lymphatic disorder-platelets down to 83 from 87 from 109 on 1/3. Monitor carefully. We'll place SCDs and hold  off on Lovenox Depression-continue sertraline 100 daily Hypothyroidism continue Synthroid 112 every morning CAD Tongue ulcer-we will place Orajel order for patient Chronic  lymphedema Frequent falls   DVT prophylaxis: SCD's Code Status: Full Family Communication: Will be calling son Disposition Plan: ? Unclear-might decide on SNF   Consultants:   none  Procedures:   none  Antimicrobials:   tamiflu 1/3  Doxycylcine 1/3    Subjective:  Looks better Has mouth pain to R side of tongue No fever no chills Overall appears closer to a normal baseline  Objective: Vitals:   02/21/16 0152 02/21/16 0437 02/21/16 1110 02/21/16 1336  BP: 107/65 109/73 108/66   Pulse: 83 85 79   Resp: 18 20    Temp: 97.3 F (36.3 C) 97.5 F (36.4 C)    TempSrc: Oral Axillary    SpO2: 96% 95% 98% 97%  Weight:  74.9 kg (165 lb 3.2 oz)    Height:       No intake or output data in the 24 hours ending 02/21/16 1400 Filed Weights   02/18/16 2158 02/19/16 2053 02/21/16 0437  Weight: 74.8 kg (165 lb) 71.3 kg (157 lb 3.2 oz) 74.9 kg (165 lb 3.2 oz)    Examination:  General exam: Appears calm-Discharge distress is resolved patient seemed much improved from prior, tongue has small laceration to the lateral side on the right side Respiratory system: Clear to auscultation. Respiratory effort improved and decreased crackles bilaterally Cardiovascular system: S1 & S2 heard, RRR. No JVD No pedal edema-legs were wrapped and unwrapped Gastrointestinal system: Abdomen is nondistended, soft and nontender. No organomegaly or masses felt. Normal bowel sounds heard. Central nervous system: Alert and oriented. No focal neurological deficits. Extremities: Symmetric 5 x 5 power. Skin: Patient has a stage II excoriation on the back of the  right calf which is quite painful Psychiatry: Judgement and insight appear normal. Mood & affect appropriate.     Data Reviewed: I have personally reviewed following labs and imaging studies  CBC:  Recent Labs Lab 02/18/16 2220 02/19/16 0533 02/20/16 0649 02/21/16 0614  WBC 3.8* 3.7* 6.5 4.7  NEUTROABS 2.6 2.3  --  3.3  HGB 12.8 12.0  12.0 12.1  HCT 39.0 37.4 38.9 39.0  MCV 101.0* 100.3* 104.3* 104.0*  PLT 73* 90* 87* 83*   Basic Metabolic Panel:  Recent Labs Lab 02/18/16 2220 02/19/16 0533 02/20/16 0649 02/21/16 0614  NA 135 138 136 139  K 3.0* 2.8* 4.2 3.6  CL 91* 94* 97* 97*  CO2 25 29 28  32  GLUCOSE 80 81 102* 97  BUN 75* 75* 83* 81*  CREATININE 1.61* 1.57* 1.58* 1.56*  CALCIUM 9.8 9.4 9.0 9.2   GFR: Estimated Creatinine Clearance: 29 mL/min (by C-G formula based on SCr of 1.56 mg/dL (H)). Liver Function Tests:  Recent Labs Lab 02/18/16 2220 02/19/16 0533  AST 50* 54*  ALT 16 15  ALKPHOS 70 63  BILITOT 1.4* 1.5*  PROT 7.2 6.5  ALBUMIN 3.3* 3.1*   No results for input(s): LIPASE, AMYLASE in the last 168 hours. No results for input(s): AMMONIA in the last 168 hours. Coagulation Profile: No results for input(s): INR, PROTIME in the last 168 hours. Cardiac Enzymes:  Recent Labs Lab 02/19/16 0533 02/19/16 1226 02/19/16 1840  TROPONINI 0.04* 0.04* 0.04*   BNP (last 3 results) No results for input(s): PROBNP in the last 8760 hours. HbA1C: No results for input(s): HGBA1C in the last 72 hours. CBG: No results for input(s): GLUCAP in the last 168 hours. Lipid Profile: No results for input(s): CHOL, HDL, LDLCALC, TRIG, CHOLHDL, LDLDIRECT in the last 72 hours. Thyroid Function Tests:  Recent Labs  02/19/16 0533  TSH 12.767*   Anemia Panel: No results for input(s): VITAMINB12, FOLATE, FERRITIN, TIBC, IRON, RETICCTPCT in the last 72 hours. Sepsis Labs:  Recent Labs Lab 02/19/16 0210  LATICACIDVEN 1.10    Recent Results (from the past 240 hour(s))  Urine culture     Status: Abnormal   Collection Time: 02/18/16 11:58 PM  Result Value Ref Range Status   Specimen Description URINE, CATHETERIZED  Final   Special Requests NONE  Final   Culture <10,000 COLONIES/mL INSIGNIFICANT GROWTH (A)  Final   Report Status 02/20/2016 FINAL  Final         Radiology Studies: Dg Chest 2  View  Result Date: 02/20/2016 CLINICAL DATA:  82 year old female with history of pneumonia. Followup study. EXAM: CHEST  2 VIEW COMPARISON:  Chest x-ray 02/18/2016. FINDINGS: Lung volumes are normal. Retrocardiac opacity obscuring portions of the left hemidiaphragm, concerning for left lower lobe pneumonia. Possible trace left pleural effusion. Right lung is clear. No pneumothorax. Cephalization of the pulmonary vasculature, without frank pulmonary edema. Mild cardiomegaly. The patient is rotated to the left on today's exam, resulting in distortion of the mediastinal contours and reduced diagnostic sensitivity and specificity for mediastinal pathology. Aortic atherosclerosis. IMPRESSION: 1. Findings are concerning for left lower lobe pneumonia, likely with trace left pleural effusion. Followup PA and lateral chest X-ray is recommended in 3-4 weeks following trial of antibiotic therapy to ensure resolution and exclude underlying malignancy. 2. Cardiomegaly with pulmonary venous congestion, but no frank pulmonary edema. 3. Aortic atherosclerosis. Electronically Signed   By: Vinnie Langton M.D.   On: 02/20/2016 13:50  Scheduled Meds: . aspirin EC  81 mg Oral Daily  . atorvastatin  40 mg Oral QPC supper  . calcitRIOL  0.25 mcg Oral Daily  . cholecalciferol  2,000 Units Oral Daily  . clindamycin (CLEOCIN) IV  600 mg Intravenous Q8H  . Darbepoetin Alfa  100 mcg Subcutaneous Q Fri-1800  . ferrous sulfate  325 mg Oral QPC supper  . ipratropium-albuterol  3 mL Nebulization QID  . levothyroxine  112 mcg Oral QAC breakfast  . magnesium oxide  400 mg Oral Daily  . metolazone  5 mg Oral Q M,W,F  . metoprolol succinate  50 mg Oral Daily  . oseltamivir  30 mg Oral Daily  . potassium chloride SA  60 mEq Oral BID  . sertraline  100 mg Oral Daily  . torsemide  100 mg Oral Daily  . vitamin E  400 Units Oral Daily   Continuous Infusions:   LOS: 2 days    Time spent: 25    Nita Sells, MD [facility] Pager [phone]  If 7PM-7AM, please contact night-coverage [email or web address] Password [email or web address] 02/21/2016, 2:00 PM

## 2016-02-21 NOTE — Telephone Encounter (Signed)
Closed encounter °

## 2016-02-21 NOTE — Telephone Encounter (Signed)
This encounter was created in error - please disregard.

## 2016-02-21 NOTE — Progress Notes (Signed)
Called son x 2 today to discuss concerns but not  able to reach him  Verneita Griffes, MD [facility] [phone]

## 2016-02-21 NOTE — Progress Notes (Signed)
PT Cancellation Note  Patient Details Name: TRENIKA SAVASTA MRN: [id number] DOB: 18-May-1934   Cancelled Treatment:    Reason Eval/Treat Not Completed: Patient declined, no reason specified (patient has not touched her lunch and wished to eat at this time.  Will continue efforts if time permits.  )   Finian Helvey Eli Hose 02/21/2016, 3:17 PM Governor Rooks, PTA pager [phone]

## 2016-02-21 NOTE — Addendum Note (Signed)
Addended by: Tobi Bastos on: 02/21/2016 07:15 PM   Modules accepted: Miquel Dunn

## 2016-02-22 ENCOUNTER — Inpatient Hospital Stay (HOSPITAL_COMMUNITY): Payer: Medicare Other

## 2016-02-22 LAB — CBC
HCT: 38.3 % (ref 36.0–46.0)
Hemoglobin: 11.9 g/dL — ABNORMAL LOW (ref 12.0–15.0)
MCH: 32.7 pg (ref 26.0–34.0)
MCHC: 31.1 g/dL (ref 30.0–36.0)
MCV: 105.2 fL — ABNORMAL HIGH (ref 78.0–100.0)
PLATELETS: 74 10*3/uL — AB (ref 150–400)
RBC: 3.64 MIL/uL — AB (ref 3.87–5.11)
RDW: 16.6 % — AB (ref 11.5–15.5)
WBC: 4.5 10*3/uL (ref 4.0–10.5)

## 2016-02-22 MED ORDER — CLINDAMYCIN PHOSPHATE 600 MG/50ML IV SOLN
600.0000 mg | Freq: Three times a day (TID) | INTRAVENOUS | Status: DC
  Administered 2016-02-22 – 2016-02-24 (×6): 600 mg via INTRAVENOUS
  Filled 2016-02-22 (×7): qty 50

## 2016-02-22 MED ORDER — POTASSIUM CHLORIDE CRYS ER 20 MEQ PO TBCR
40.0000 meq | EXTENDED_RELEASE_TABLET | Freq: Every day | ORAL | Status: DC
  Administered 2016-02-22 – 2016-02-24 (×3): 40 meq via ORAL
  Filled 2016-02-22 (×3): qty 2

## 2016-02-22 MED ORDER — IPRATROPIUM-ALBUTEROL 0.5-2.5 (3) MG/3ML IN SOLN
3.0000 mL | Freq: Three times a day (TID) | RESPIRATORY_TRACT | Status: DC
  Administered 2016-02-22 – 2016-02-26 (×13): 3 mL via RESPIRATORY_TRACT
  Filled 2016-02-22 (×13): qty 3

## 2016-02-22 MED ORDER — CLINDAMYCIN PHOSPHATE 300 MG/50ML IV SOLN
300.0000 mg | Freq: Three times a day (TID) | INTRAVENOUS | Status: DC
  Filled 2016-02-22 (×2): qty 50

## 2016-02-22 NOTE — Clinical Social Work Note (Signed)
CSW updated patient's family, son [name] and grandson Loa Socks on discharge options. Family has chosen [facility] for placement. Patient's husband DC'ed to facility 1/6, and patient will join husband once medically stable. CSW will continue to follow for discharge needs.   Liz Beach MSW, [place], [place], [phone]
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
## 2016-02-22 NOTE — Progress Notes (Signed)
Patient's core temp dropped to 96.4 F. MD page notified. MD advised to continue to monitor.

## 2016-02-22 NOTE — Progress Notes (Signed)
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  Discharge weight 68.5--wght this admit 74 kg Coronary artery disease CO2 edema Chronic kidney disease stage III Lymphedema  FLU + with bronchitis and some volme overload   Assessment & Plan:   Principal Problem:   Acute respiratory failure with hypoxia (HCC) Active Problems:   Essential hypertension   COPD (chronic obstructive pulmonary disease) (HCC)   Pulmonary hypertension   CKD (chronic kidney disease), stage III   Coronary artery disease   Chronic cor pulmonale (HCC)   Atrial fibrillation, chronic (HCC)   Obstructive sleep apnea   Cellulitis of lower extremity   Acute bronchitis   Bronchitis   Influenza A positive-Tamiflu, change doxycycline--> Zosyn based on get 2 vw CXR showing left lower lobe pneumonia. Transitioned to Ceftin 02/21/16 d/t hypothermia rpt CXR 2 VW and re-start as clindamycin 02/22/16.  Recent falls, left scalp hematoma-PT recommends skilled placement-d/w patient-considering this as option Permanent atrial fibrillation [place] score >4 not on anticoagulation secondary to falls-continue Toprol-XL 50 daily   Chr Diastolic HF-resume torsemide 100 3 times a day-weight on admission was 74 kg at last admission was 68.5. Strict ins and outs. Cut back  dose to 100 mg daily for now. Continue Unna boot dressings Milroy disease-lymphatic disorder-takes Chr MSIR for this-has been cut back from 100 mg to 30.  I have cut her back 02/22/16 15 mg tid.  platelets down to 83 from 87 from 109 on 1/3. Rpt labs in am + CBC today Depression-continue sertraline 100 daily  Hypothyroidism  continue Synthroid 112 every morning CAD  Tongue ulcer-we will place Orajel order for patient Chronic lymphedema  Frequent falls Chronic pain-opiate use disorder-see s/w notes  DVT prophylaxis: SCD's Code Status: Full Family Communication: called son and updated fully Disposition Plan: ? Unclear-might decide on SNF   Consultants:   none  Procedures:   none  Antimicrobials:   tamiflu 1/3  Doxycylcine 1/3   Ceftin 1/6  Clindamycin 1/6   Subjective:  Fair-not doing as well no issues Eating drinking  Objective: Vitals:   02/22/16 0938 02/22/16 0953 02/22/16 1011 02/22/16 1328  BP: 95/64 110/66  114/62  Pulse: 76   84  Resp:    16  Temp: (!) 95.8 F (35.4 C) 97.3 F (36.3 C)  (!) 96.4 F (35.8 C)  TempSrc: Oral Rectal  Rectal  SpO2: 96%  99% 98%  Weight:      Height:        Intake/Output Summary (Last 24 hours) at 02/22/16 1458 Last data filed at 02/22/16 1400  Gross per 24 hour  Intake              120 ml  Output                0 ml  Net              120 ml   Filed Weights   02/18/16 2158 02/19/16 2053 02/21/16 0437  Weight: 74.8 kg (165 lb) 71.3 kg (157 lb 3.2 oz) 74.9 kg (165 lb 3.2 oz)    Examination:  General exam: Appears calm-resp  distress is resolved patient seemed much improved from prior, tongue has small laceration to the lateral side on the right side Respiratory system: Clear to auscultation. Respiratory effort improved and decreased crackles bilaterally Cardiovascular system: S1 & S2 heard, RRR. No JVD No pedal edema-legs were wrapped and unwrapped Gastrointestinal system: Abdomen is nondistended, soft and nontender. No organomegaly or masses felt. Normal bowel sounds heard. Central nervous system: Alert and oriented. No focal neurological deficits. Extremities: Symmetric 5 x 5 power. Skin: not examined today Psychiatry: Judgement and insight appear normal. Mood & affect appropriate.     Data Reviewed: I have personally reviewed  following labs and imaging studies  CBC:  Recent Labs Lab 02/18/16 2220 02/19/16 0533 02/20/16 0649 02/21/16 0614  WBC 3.8* 3.7* 6.5 4.7  NEUTROABS 2.6 2.3  --  3.3  HGB 12.8 12.0 12.0 12.1  HCT 39.0 37.4 38.9 39.0  MCV 101.0* 100.3* 104.3* 104.0*  PLT 73* 90* 87* 83*   Basic Metabolic Panel:  Recent Labs Lab 02/18/16 2220 02/19/16 0533 02/20/16 0649 02/21/16 0614  NA 135 138 136 139  K 3.0* 2.8* 4.2 3.6  CL 91* 94* 97* 97*  CO2 25 29 28  32  GLUCOSE 80 81 102* 97  BUN 75* 75* 83* 81*  CREATININE 1.61* 1.57* 1.58* 1.56*  CALCIUM 9.8 9.4 9.0 9.2   GFR: Estimated Creatinine Clearance: 29 mL/min (by C-G formula based on SCr of 1.56 mg/dL (H)). Liver Function Tests:  Recent Labs Lab 02/18/16 2220 02/19/16 0533  AST 50* 54*  ALT 16 15  ALKPHOS 70 63  BILITOT 1.4* 1.5*  PROT 7.2 6.5  ALBUMIN 3.3* 3.1*   No results for input(s): LIPASE, AMYLASE in the last 168 hours. No results for input(s): AMMONIA in the last 168 hours. Coagulation Profile: No results for input(s): INR, PROTIME in the last 168 hours. Cardiac Enzymes:  Recent Labs Lab 02/19/16 0533 02/19/16 1226 02/19/16 1840  TROPONINI 0.04* 0.04* 0.04*   BNP (last 3 results) No results for input(s): PROBNP in the last 8760 hours. HbA1C: No results for input(s): HGBA1C in the last 72 hours. CBG: No results for input(s): GLUCAP in the last 168 hours. Lipid Profile: No results for input(s): CHOL, HDL, LDLCALC, TRIG, CHOLHDL, LDLDIRECT in the last 72 hours. Thyroid Function Tests: No results for input(s): TSH, T4TOTAL, FREET4, T3FREE, THYROIDAB in the last 72 hours. Anemia Panel: No results for input(s): VITAMINB12, FOLATE, FERRITIN, TIBC, IRON, RETICCTPCT in the last 72 hours. Sepsis Labs:  Recent Labs Lab 02/19/16 0210  LATICACIDVEN 1.10    Recent Results (from the past 240 hour(s))  Urine culture     Status: Abnormal   Collection Time: 02/18/16 11:58 PM  Result Value Ref Range Status    Specimen Description URINE, CATHETERIZED  Final   Special Requests NONE  Final   Culture <10,000 COLONIES/mL INSIGNIFICANT GROWTH (A)  Final   Report Status 02/20/2016 FINAL  Final         Radiology Studies: No results found.      Scheduled Meds: . aspirin EC  81 mg Oral Daily  . atorvastatin  40 mg Oral QPC supper  . calcitRIOL  0.25 mcg Oral Daily  . cefUROXime  500 mg Oral BID WC  . cholecalciferol  2,000 Units Oral Daily  . Darbepoetin Alfa  100 mcg Subcutaneous Q Fri-1800  . ferrous sulfate  325 mg Oral QPC supper  . ipratropium-albuterol  3 mL Nebulization TID  . levothyroxine  112 mcg Oral QAC breakfast  .  magnesium oxide  400 mg Oral Daily  . metolazone  5 mg Oral Q M,W,F  . metoprolol succinate  50 mg Oral Daily  . oseltamivir  30 mg Oral Daily  . potassium chloride  40 mEq Oral Daily  . potassium chloride SA  60 mEq Oral BID  . sertraline  100 mg Oral Daily  . torsemide  100 mg Oral Daily  . vitamin E  400 Units Oral Daily   Continuous Infusions:   LOS: 3 days    Time spent: 25    Nita Sells, MD [facility] Pager [phone]  If 7PM-7AM, please contact night-coverage [email or web address] Password [email or web address] 02/22/2016, 2:58 PM

## 2016-02-23 ENCOUNTER — Inpatient Hospital Stay (HOSPITAL_COMMUNITY): Payer: Medicare Other

## 2016-02-23 LAB — CBC
HEMATOCRIT: 39.5 % (ref 36.0–46.0)
Hemoglobin: 11.9 g/dL — ABNORMAL LOW (ref 12.0–15.0)
MCH: 32.3 pg (ref 26.0–34.0)
MCHC: 30.1 g/dL (ref 30.0–36.0)
MCV: 107.3 fL — ABNORMAL HIGH (ref 78.0–100.0)
Platelets: 76 10*3/uL — ABNORMAL LOW (ref 150–400)
RBC: 3.68 MIL/uL — ABNORMAL LOW (ref 3.87–5.11)
RDW: 16.6 % — AB (ref 11.5–15.5)
WBC: 5.3 10*3/uL (ref 4.0–10.5)

## 2016-02-23 MED ORDER — SALINE SPRAY 0.65 % NA SOLN
1.0000 | NASAL | Status: DC | PRN
  Administered 2016-02-23 (×2): 1 via NASAL
  Filled 2016-02-23: qty 44

## 2016-02-23 NOTE — Progress Notes (Signed)
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  Discharge weight 68.5--wght this admit 74 kg Coronary artery disease CO2 edema Chronic kidney disease stage III Lymphedema  FLU + with bronchitis and some volme overload   Assessment & Plan:   Principal Problem:   Acute respiratory failure with hypoxia (HCC) Active Problems:   Essential hypertension   COPD (chronic obstructive pulmonary disease) (HCC)   Pulmonary hypertension   CKD (chronic kidney disease), stage III   Coronary artery disease   Chronic cor pulmonale (HCC)   Atrial fibrillation, chronic (HCC)   Obstructive sleep apnea   Cellulitis of lower extremity   Acute bronchitis   Bronchitis   Influenza A positive-Tamiflu, change doxycycline--> Zosyn based on get 2 vw CXR showing left lower lobe pneumonia. Transitioned to Ceftin 02/21/16 d/t hypothermia rpt CXR 2 VW and re-start as clindamycin 02/23/16. Patient has continued to desaturate and went to the 70s while on oxygen so we ordered a repeat chest x-ray 1/7. I will asked speech therapy to see the patient as patient does cough at baseline  Recent falls, left scalp hematoma-PT recommends skilled placement-d/w patient-considering this as option Permanent atrial fibrillation Mali score >4 not on anticoagulation secondary to falls-continue Toprol-XL 50 daily   Chr Diastolic- home dose torsemide 100 3 times a day-weight on admission was 74 kg at last admission was 68.5--current weight is 71.8 Strict ins and outs. Cut back dose to 100 mg daily for now, continue metolazone 5 Monday Wednesday Friday. Continue Unna boot dressings Milroy  disease-lymphatic disorder-takes Chr MSIR for this-has been cut back from 100 mg to 30.  I have cut her back 02/23/16 15 mg tid.  platelets down to 83 from 87 from 109 on 1/3. Rpt labs in am + CBC today Depression-continue sertraline 100 daily  Hypothyroidism continue Synthroid 112 every morning CAD  Tongue ulcer-we will place Orajel order for patient Chronic lymphedema  Frequent falls Chronic pain-opiate use disorder-see s/w notes  DVT prophylaxis: SCD's Code Status: Full Family Communication: Son not present Disposition Plan: ? Unclear-might decide on SNF   Consultants:   none  Procedures:   none  Antimicrobials:   tamiflu 1/3  Doxycylcine 1/3   Ceftin 1/6  Clindamycin 1/6   Subjective:  Doing fairly only Nursing in forms of desaturation  Objective: Vitals:   02/23/16 0740 02/23/16 0929 02/23/16 1033 02/23/16 1057  BP: 114/71 97/69  114/70  Pulse: 72 78  76  Resp: 18 16    Temp:  98.5 F (36.9 C)    TempSrc:  Oral    SpO2: 94% (!) 89% 96%   Weight:      Height:        Intake/Output Summary (Last 24 hours) at 02/23/16 1137 Last data filed at 02/23/16 0438  Gross per 24 hour  Intake              760 ml  Output                0 ml  Net              760 ml   Filed  Weights   02/19/16 2053 02/21/16 0437 02/23/16 0500  Weight: 71.3 kg (157 lb 3.2 oz) 74.9 kg (165 lb 3.2 oz) 71.8 kg (158 lb 4.8 oz)    Examination:  General exam: Appears calm- improved from prior, tongue has small laceration to the lateral side on the right side Respiratory system: Clear to auscultation. Respiratory effort improved and Increased crackles right posterior chest Cardiovascular system: S1 & S2 heard, RRR. No JVD No pedal edema-legs were wrapped and unwrapped Gastrointestinal system: Abdomen is nondistended, soft and nontender. No organomegaly or masses felt. Normal bowel sounds heard. Central nervous system: Alert and oriented. No focal neurological deficits. Extremities:  Symmetric 5 x 5 power. Skin: not examined today Psychiatry: Judgement and insight appear normal. Mood & affect appropriate.     Data Reviewed: I have personally reviewed following labs and imaging studies  CBC:  Recent Labs Lab 02/18/16 2220 02/19/16 0533 02/20/16 0649 02/21/16 0614 02/22/16 1540 02/23/16 0710  WBC 3.8* 3.7* 6.5 4.7 4.5 5.3  NEUTROABS 2.6 2.3  --  3.3  --   --   HGB 12.8 12.0 12.0 12.1 11.9* 11.9*  HCT 39.0 37.4 38.9 39.0 38.3 39.5  MCV 101.0* 100.3* 104.3* 104.0* 105.2* 107.3*  PLT 73* 90* 87* 83* 74* 76*   Basic Metabolic Panel:  Recent Labs Lab 02/18/16 2220 02/19/16 0533 02/20/16 0649 02/21/16 0614  NA 135 138 136 139  K 3.0* 2.8* 4.2 3.6  CL 91* 94* 97* 97*  CO2 25 29 28  32  GLUCOSE 80 81 102* 97  BUN 75* 75* 83* 81*  CREATININE 1.61* 1.57* 1.58* 1.56*  CALCIUM 9.8 9.4 9.0 9.2   GFR: Estimated Creatinine Clearance: 28.4 mL/min (by C-G formula based on SCr of 1.56 mg/dL (H)). Liver Function Tests:  Recent Labs Lab 02/18/16 2220 02/19/16 0533  AST 50* 54*  ALT 16 15  ALKPHOS 70 63  BILITOT 1.4* 1.5*  PROT 7.2 6.5  ALBUMIN 3.3* 3.1*   No results for input(s): LIPASE, AMYLASE in the last 168 hours. No results for input(s): AMMONIA in the last 168 hours. Coagulation Profile: No results for input(s): INR, PROTIME in the last 168 hours. Cardiac Enzymes:  Recent Labs Lab 02/19/16 0533 02/19/16 1226 02/19/16 1840  TROPONINI 0.04* 0.04* 0.04*   BNP (last 3 results) No results for input(s): PROBNP in the last 8760 hours. HbA1C: No results for input(s): HGBA1C in the last 72 hours. CBG: No results for input(s): GLUCAP in the last 168 hours. Lipid Profile: No results for input(s): CHOL, HDL, LDLCALC, TRIG, CHOLHDL, LDLDIRECT in the last 72 hours. Thyroid Function Tests: No results for input(s): TSH, T4TOTAL, FREET4, T3FREE, THYROIDAB in the last 72 hours. Anemia Panel: No results for input(s): VITAMINB12, FOLATE, FERRITIN,  TIBC, IRON, RETICCTPCT in the last 72 hours. Sepsis Labs:  Recent Labs Lab 02/19/16 0210  LATICACIDVEN 1.10    Recent Results (from the past 240 hour(s))  Urine culture     Status: Abnormal   Collection Time: 02/18/16 11:58 PM  Result Value Ref Range Status   Specimen Description URINE, CATHETERIZED  Final   Special Requests NONE  Final   Culture <10,000 COLONIES/mL INSIGNIFICANT GROWTH (A)  Final   Report Status 02/20/2016 FINAL  Final         Radiology Studies: Dg Chest 2 View  Result Date: 02/22/2016 CLINICAL DATA:  Bronchitis and volume overload. EXAM: CHEST  2 VIEW COMPARISON:  February 20, 2016 FINDINGS: The left retrocardiac opacity seen previously has improved. No  overt edema. Stable cardiomegaly. IMPRESSION: Improving left retrocardiac opacity.  No overt edema. Electronically Signed   By: Dorise Bullion III M.D   On: 02/22/2016 17:31        Scheduled Meds: . aspirin EC  81 mg Oral Daily  . atorvastatin  40 mg Oral QPC supper  . calcitRIOL  0.25 mcg Oral Daily  . cholecalciferol  2,000 Units Oral Daily  . clindamycin (CLEOCIN) IV  600 mg Intravenous Q8H  . Darbepoetin Alfa  100 mcg Subcutaneous Q Fri-1800  . ferrous sulfate  325 mg Oral QPC supper  . ipratropium-albuterol  3 mL Nebulization TID  . levothyroxine  112 mcg Oral QAC breakfast  . magnesium oxide  400 mg Oral Daily  . metolazone  5 mg Oral Q M,W,F  . metoprolol succinate  50 mg Oral Daily  . potassium chloride  40 mEq Oral Daily  . sertraline  100 mg Oral Daily  . torsemide  100 mg Oral Daily  . vitamin E  400 Units Oral Daily   Continuous Infusions:   LOS: 4 days    Time spent: 25    Nita Sells, MD [facility] Pager [phone]  If 7PM-7AM, please contact night-coverage [email or web address] Password [facility] 02/23/2016, 11:37 AM

## 2016-02-24 LAB — CBC
HCT: 41 % (ref 36.0–46.0)
HEMOGLOBIN: 12.1 g/dL (ref 12.0–15.0)
MCH: 32.4 pg (ref 26.0–34.0)
MCHC: 29.5 g/dL — AB (ref 30.0–36.0)
MCV: 109.9 fL — ABNORMAL HIGH (ref 78.0–100.0)
Platelets: 81 10*3/uL — ABNORMAL LOW (ref 150–400)
RBC: 3.73 MIL/uL — AB (ref 3.87–5.11)
RDW: 16.6 % — ABNORMAL HIGH (ref 11.5–15.5)
WBC: 5.3 10*3/uL (ref 4.0–10.5)

## 2016-02-24 MED ORDER — ENSURE ENLIVE PO LIQD: 237.0000 mL | Freq: Two times a day (BID) | ORAL | Status: DC

## 2016-02-24 MED ORDER — BOOST / RESOURCE BREEZE PO LIQD
1.0000 | Freq: Two times a day (BID) | ORAL | Status: DC
  Administered 2016-02-24: 1 via ORAL
  Filled 2016-02-24 (×5): qty 1

## 2016-02-24 MED ORDER — CLINDAMYCIN HCL 300 MG PO CAPS
300.0000 mg | ORAL_CAPSULE | Freq: Three times a day (TID) | ORAL | Status: DC
  Administered 2016-02-24 (×2): 300 mg via ORAL
  Filled 2016-02-24: qty 1
  Filled 2016-02-24: qty 2
  Filled 2016-02-24 (×3): qty 1

## 2016-02-24 MED ORDER — TORSEMIDE 20 MG PO TABS
100.0000 mg | ORAL_TABLET | Freq: Two times a day (BID) | ORAL | Status: DC
  Administered 2016-02-24: 100 mg via ORAL
  Filled 2016-02-24: qty 5

## 2016-02-24 NOTE — Evaluation (Signed)
Clinical/Bedside Swallow Evaluation Patient Details  Name: Victoria Fisher MRN: [id number] Date of Birth: 08/17/1934  Today's Date: 02/24/2016 Time: SLP Start Time (ACUTE ONLY): 1137 SLP Stop Time (ACUTE ONLY): 1152 SLP Time Calculation (min) (ACUTE ONLY): 15 min  Past Medical History:  Past Medical History:  Diagnosis Date  . Abnormality of gait 01/30/2015  . Anemia in CKD (chronic kidney disease)   . Anxiety   . Arthritis   . Asthma    On nebulizer  . Atrial fibrillation, chronic ([place])    a. Rate controlled w/ BB. No OAC 2/2 falls and gait instability.  . Bowel obstruction    Caused by scar tissue  . Chronic cor pulmonale (HCC)    Most likely due to COPD (cannot rule out contribution of diastolic LV dysfunction). ECHO 07/30/11 pulmonary artery pressure was estimated to be 50-60% mmHg & there was moderate tricuspid insufficiency.  . Chronic diastolic CHF (congestive heart failure) ([place])    a. Predominantly right heart failure due to cor pulmonale & in turn this is most likely due to untreated OSA;  b. 05/2013 Echo: EF 55-60%, no rwma, mild to mod MR, sev dil LA, mod to sev dil RV w/ reduced systolic fxn, massively dil RA, mod-sev TR, PASP 38mmHg.  [name] Chronic pain syndrome   . Colon cancer ([place])    Carcinoma of sigmoid colon 1984 & 1985.  [name] COPD (chronic obstructive pulmonary disease) ([place])   . Coronary artery disease    a. occluded proximal diagonal artery on angiography 2004  . Depression   . Diverticulosis of colon   . 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 Dyspnea on exertion    Reluctant to take diuretics  . Edema of both legs    Venous Doppler 05/11/12 was normal & no evidence of thrombus or thrombophlebitis. Chronic LE edema related to both cor pulmonale & lymphedema, hyperlipidemia & HTN involving coronary atherosclerosis by cardiac cath in 2004. Cath showed a tiny old occlusion of the optional diagonal branch & also proximal LAD.  [name] Frequent falls    Possibly due to orthostatic  hypotension but we have not seen that on our visits. Recent fall 05/2012 with scalp hematoma, swollen left LE, & black eyes.  . Hyperlipidemia    On a statin. Catheterization 02/12/03 Showed a tiny old occlusion of the optional diagonal branch & also proximal LAD.  [name] Hypertension   . Hypothyroidism 2002   On hormone replacement therapy  . IBS (irritable bowel syndrome)   . Insomnia   . Kidney failure 09/05/2015   Pt states that she went to the doctor on thursday and was told she is in stage 4 kidney failure  . Lung nodule   . Milroy's disease    Lymphatic disorder diagnosed at [facility]  . Obstructive sleep apnea    Polysomnogram 09/25/11 AHI: 53.6/hr overall & 57.1/hr during REM sleep. Poor compliance previously with CPAP  . Pulmonary hypertension    Pullmonary hypertension, chronic cor pulmonale w/pulmonary artery pressures of 50&#x2011; 60 mmHg.  . Tuberculosis    Past Surgical History:  Past Surgical History:  Procedure Laterality Date  . ABDOMINAL HYSTERECTOMY  1983  . APPENDECTOMY  1956  . CARDIAC CATHETERIZATION  02/12/03   Showed a tiny old occlusion of the optional diagonal branch & also proximal LAD.  [name] CHOLECYSTECTOMY  1984  . COLON BIOPSY    . COLON RESECTION     2 times  . COLONOSCOPY  07/23/10   HPI:  Victoria Zimny  Fisher a 82 y.o.femalewith history of diastolic CHF, COPD, OSA, colon cancer,hypertension, chronic lymphedema CAD chronic kidney disease A. fib presents to the ER because of shortness of breath and productive cough and frequent falls. Initial ER physician note the patient was confused for which CT head was negative for acute intracranial process. Per note pt admitted for bronchitis and lower extremity cellulitis. CXR Continued improvement in left lower lobe airspace disease. MD ordered ST eval due to desat to 70's on 02 and baseline cough (cxr ordered 1/7).   Assessment / Plan / Recommendation Clinical Impression  Pt states "I choke every time I swallow" and  reports needing soft foods due to partial missing./broken. Pt's head is in a permanent left head tilt from prior "falls" and required straw to obtain liquid. Pt coughed at end of session with several minute delay after consuming thin, graham crackers in applesauce (pt stated could not masticate cracker without partial). Pt would benefit from objective assessment given diagnosis of acute respiratory failure with hypoxia, pt's subjective information re: swallow. Recommend continue current diet (soft) and thin liquids with MBS next date.       Aspiration Risk  Moderate aspiration risk    Diet Recommendation Thin liquid (soft)   Liquid Administration via: Straw Medication Administration: Whole meds with liquid Supervision: Patient able to self feed;Staff to assist with self feeding Compensations: Small sips/bites;Slow rate Postural Changes: Seated upright at 90 degrees    Other  Recommendations Oral Care Recommendations: Oral care BID   Follow up Recommendations  (TBD)      Frequency and Duration            Prognosis        Swallow Study   General HPI: Victoria Fisher a 82 y.o.femalewith history of diastolic CHF, COPD, OSA, colon cancer,hypertension, chronic lymphedema CAD chronic kidney disease A. fib presents to the ER because of shortness of breath and productive cough and frequent falls. Initial ER physician note the patient was confused for which CT head was negative for acute intracranial process. Per note pt admitted for bronchitis and lower extremity cellulitis. CXR Continued improvement in left lower lobe airspace disease. MD ordered ST eval due to desat to 70's on 02 and baseline cough (cxr ordered 1/7). Type of Study: Bedside Swallow Evaluation Previous Swallow Assessment: none Diet Prior to this Study: Thin liquids (soft) Temperature Spikes Noted: No Respiratory Status: Nasal cannula History of Recent Intubation: No Behavior/Cognition: Alert;Requires  cueing;Cooperative Oral Cavity Assessment: Within Functional Limits Oral Care Completed by SLP: No Oral Cavity - Dentition:  (missing upper partial , missing several lower) Vision: Functional for self-feeding Self-Feeding Abilities: Needs set up;Needs assist Patient Positioning: Upright in bed (head is in a left tilt constant position) Baseline Vocal Quality: Normal (questionable hoarseness) Volitional Cough: Strong Volitional Swallow: Able to elicit    Oral/Motor/Sensory Function Overall Oral Motor/Sensory Function: Within functional limits   Ice Chips Ice chips: Not tested   Thin Liquid Thin Liquid: Within functional limits Presentation: Straw    Nectar Thick Nectar Thick Liquid: Not tested   Honey Thick Honey Thick Liquid: Not tested   Puree Puree: Within functional limits   Solid   GO   Solid: Within functional limits (assessed cracker in applesauce)        Mick Sell, Orbie Pyo 02/24/2016,12:19 PM  Orbie Pyo Colvin Caroli.[employer] [phone]

## 2016-02-24 NOTE — Progress Notes (Signed)
Initial Nutrition Assessment   INTERVENTION:  Provide Ensure Enlive po BID, each supplement provides 350 kcal and 20 grams of protein Provide Boost Breeze po TID, each supplement provides 250 kcal and 9 grams of protein Provide Magic Cup ice cream once daily with dinner, supplement provides 290 kcal and 9 grams of protein   NUTRITION DIAGNOSIS:   Inadequate oral intake related to nausea as evidenced by per patient/family report, meal completion < 25%.   GOAL:   Patient will meet greater than or equal to 90% of their needs   MONITOR:   PO intake, Supplement acceptance, Labs, Skin, I & O's, Weight trends  REASON FOR ASSESSMENT:   Low Braden    ASSESSMENT:   82 y.o. female with history of diastolic CHF, cor pulmonale, hypertension, chronic lymphedema CAD chronic kidney disease A. fib presents to the ER because of shortness of breath and productive cough. Patient also has been feeling weak and has been having frequent falls.  Pt very lethargic at time of visit (~1615 hr). No family present. Lunch tray at bedside at time of visit was untouched. Pt states that she has been feeling nauseous and is not interested in eating anything right now. Difficult to assess weight loss as pt's weight has fluctuated the past few months. Pt doesn't seem to have any significant fat or muscle wasting. RN reports that patient has the flu and has not been eating. RD to order a variety of supplements for pt to try.   Labs: low chloride, elevated BUN  Diet Order:  DIET SOFT Room service appropriate? Yes; Fluid consistency: Thin  Skin:  Wound (see comment) (Stage II pressure injury on right posterior leg)  Last BM:  1/2  Height:   Ht Readings from Last 1 Encounters:  02/19/16 5\' 4"  (1.626 m)    Weight:   Wt Readings from Last 1 Encounters:  02/23/16 158 lb 4.8 oz (71.8 kg)    Ideal Body Weight:  54.54 kg  BMI:  Body mass index is 27.17 kg/m.  Estimated Nutritional Needs:   Kcal:   1550-1750  Protein:  85-95 grams  Fluid:  1.6-1.8 L.day  EDUCATION NEEDS:   No education needs identified at this time  Scarlette Ar RD, CSP, LDN Inpatient Clinical Dietitian Pager: [phone] After Hours Pager: [phone]

## 2016-02-24 NOTE — Care Management Note (Signed)
Case Management Note  Patient Details  Name: Victoria Fisher MRN: [id number] Date of Birth: 02-10-1935  Subjective/Objective:                    Action/Plan: Plan is for SNF once pt is medically ready. CM following for d/c needs.   Expected Discharge Date:  02/22/16               Expected Discharge Plan:   (Independent Living)  In-House Referral:  Clinical Social Work  Discharge planning Services  CM Consult  Post Acute Care Choice:  NA Choice offered to:  NA  DME Arranged:  N/A DME Agency:  NA  HH Arranged:    HH Agency:  [facility]  Status of Service:  In process, will continue to follow  If discussed at Long Length of Stay Meetings, dates discussed:    Additional Comments:  Pollie Friar, RN 02/24/2016, 3:15 PM

## 2016-02-24 NOTE — Progress Notes (Signed)
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  Discharge weight 68.5--wght this admit 74 kg Coronary artery disease CO2 edema Chronic kidney disease stage III Lymphedema  FLU + with bronchitis and some volume overload   Assessment & Plan:   Principal Problem:   Acute respiratory failure with hypoxia (HCC) Active Problems:   Essential hypertension   COPD (chronic obstructive pulmonary disease) (HCC)   Pulmonary hypertension   CKD (chronic kidney disease), stage III   Coronary artery disease   Chronic cor pulmonale (HCC)   Atrial fibrillation, chronic (HCC)   Obstructive sleep apnea   Cellulitis of lower extremity   Acute bronchitis   Bronchitis   Influenza A positive-Tamiflu, change doxycycline--> Zosyn based on get 2 vw CXR showing left lower lobe pneumonia. Transitioned to Ceftin 02/21/16 d/t hypothermia rpt CXR 2 VW and re-start as clindamycin as PO 02/24/16. Patient has continued to desaturate and went to the 70s while on oxygen so we ordered a repeat chest x-ray 1/7 which is clear. Speech therapy consult requested  Recent falls, left scalp hematoma-PT recommends skilled placement-d/w patient-considering this as option Permanent atrial fibrillation [place] score >4 not on anticoagulation secondary to falls-continue Toprol-XL 50 daily   Chr Diastolic- home dose torsemide 100 3 times a day-weight on admission was 74 kg at last admission was 68.5--current weight is 71.8 -patient is  +1.1 Liter increase torsemide dose to 100 mg od-->100 twice a day restarted metolazone 5 Monday Wednesday Friday-obtained chemistry in a.m.  Milroy  disease-lymphatic disorder-takes Chr MSIR for this-has been cut back from 100 mg to 30.  I have cut her back 1/6 15 mg tid.  platelets down to 83 from 87 from 109 on 1/3. Rpt labs in am + CBC today  Continue Unna boot dressings Depression-continue sertraline 100 daily  Hypothyroidism continue Synthroid 112 every morning CAD  Tongue ulcer-we will place Orajel order for patient  Malnutrition probably secondary from this of moderate degree Chronic lymphedema   Frequent falls Chronic pain-opiate use disorder-see s/w notes  Thrombocytopenia-unclear etiology. Obtain platelet smear. Possible related to flulike illness versus myelofibrosis however platelets have remained in the 70-80 range  DVT prophylaxis: SCD's Code Status: Full Family Communication: Son not present Disposition Plan: Patient is stabilizing and hope for discharge in 24-48 hours if all remains stable   Consultants:   none  Procedures:   none  Antimicrobials:   tamiflu 1/3  Doxycylcine 1/3   Ceftin 1/6  Clindamycin 1/6   Subjective:  Patient thirsty when I saw her and seemed to have pushed her tray and pulled her oxygen off of the table next to her. She was re-oriented. She tells me that she is in severe pain but does seem sleepy. The nurse had just medicated her with MSIR. Nurse has concerned that the patient seems to need constant attention however there are no specific issues at the stage Patient says she has not had fever or chills or other issues   Objective: Vitals:   02/23/16 2053 02/24/16 0104 02/24/16 0155 02/24/16 0644  BP:   113/69 97/64  Pulse: 88 85 88 83  Resp: 18 18  20 20  Temp:   97.9 F (36.6 C) 97.6 F (36.4 C)  TempSrc:   Axillary Axillary  SpO2: 93% 97% 95% 90%  Weight:      Height:       No intake or output data in the 24 hours ending 02/24/16 0811 Filed Weights   02/19/16 2053 02/21/16 0437 02/23/16 0500  Weight: 71.3 kg (157 lb 3.2 oz) 74.9 kg (165 lb 3.2 oz) 71.8 kg (158 lb  4.8 oz)    Examination:  General exam: Appears calm- improved from prior, tongue Seems stable Respiratory system: Clear to auscultation. Respiratory effort improved some mild crackles Cardiovascular system: S1 & S2 heard, RRR. No JVD No pedal edema-legs were wrapped and unwrapped Gastrointestinal system: Abdomen is nondistended, soft and nontender. No organomegaly or masses Normal bowel sounds heard. Central nervous system: Alert and oriented. No focal neurological deficits. Extremities: Symmetric 5 x 5 power. Skin: not examined today Psychiatry: Judgement and insight appear normal. Mood & affect appropriate.     Data Reviewed: I have personally reviewed following labs and imaging studies  CBC:  Recent Labs Lab 02/18/16 2220 02/19/16 0533 02/20/16 0649 02/21/16 0614 02/22/16 1540 02/23/16 0710 02/24/16 0440  WBC 3.8* 3.7* 6.5 4.7 4.5 5.3 5.3  NEUTROABS 2.6 2.3  --  3.3  --   --   --   HGB 12.8 12.0 12.0 12.1 11.9* 11.9* 12.1  HCT 39.0 37.4 38.9 39.0 38.3 39.5 41.0  MCV 101.0* 100.3* 104.3* 104.0* 105.2* 107.3* 109.9*  PLT 73* 90* 87* 83* 74* 76* 81*   Basic Metabolic Panel:  Recent Labs Lab 02/18/16 2220 02/19/16 0533 02/20/16 0649 02/21/16 0614  NA 135 138 136 139  K 3.0* 2.8* 4.2 3.6  CL 91* 94* 97* 97*  CO2 25 29 28  32  GLUCOSE 80 81 102* 97  BUN 75* 75* 83* 81*  CREATININE 1.61* 1.57* 1.58* 1.56*  CALCIUM 9.8 9.4 9.0 9.2   GFR: Estimated Creatinine Clearance: 28.4 mL/min (by C-G formula based on SCr of 1.56 mg/dL (H)). Liver Function Tests:  Recent Labs Lab 02/18/16 2220 02/19/16 0533  AST 50* 54*  ALT 16 15  ALKPHOS 70 63  BILITOT 1.4* 1.5*  PROT 7.2 6.5  ALBUMIN 3.3* 3.1*   No results for input(s): LIPASE, AMYLASE in the last 168 hours. No results for input(s): AMMONIA in the last 168 hours. Coagulation Profile: No results for input(s): INR, PROTIME in the last 168 hours. Cardiac Enzymes:  Recent Labs Lab 02/19/16 0533 02/19/16 1226  02/19/16 1840  TROPONINI 0.04* 0.04* 0.04*   BNP (last 3 results) No results for input(s): PROBNP in the last 8760 hours. HbA1C: No results for input(s): HGBA1C in the last 72 hours. CBG: No results for input(s): GLUCAP in the last 168 hours. Lipid Profile: No results for input(s): CHOL, HDL, LDLCALC, TRIG, CHOLHDL, LDLDIRECT in the last 72 hours. Thyroid Function Tests: No results for input(s): TSH, T4TOTAL, FREET4, T3FREE, THYROIDAB in the last 72 hours. Anemia Panel: No results for input(s): VITAMINB12, FOLATE, FERRITIN, TIBC, IRON, RETICCTPCT in the last 72 hours. Sepsis Labs:  Recent Labs Lab 02/19/16 0210  LATICACIDVEN 1.10    Recent Results (from the past 240 hour(s))  Urine culture     Status: Abnormal   Collection Time: 02/18/16 11:58 PM  Result Value Ref Range Status   Specimen Description URINE, CATHETERIZED  Final   Special Requests NONE  Final   Culture <10,000 COLONIES/mL INSIGNIFICANT GROWTH (A)  Final   Report  Status 02/20/2016 FINAL  Final  Culture, blood (Routine X 2) w Reflex to ID Panel     Status: None (Preliminary result)   Collection Time: 02/22/16  3:40 PM  Result Value Ref Range Status   Specimen Description BLOOD LEFT ASSIST CONTROL  Final   Special Requests IN PEDIATRIC BOTTLE 3CC  Final   Culture NO GROWTH < 24 HOURS  Final   Report Status PENDING  Incomplete  Culture, blood (Routine X 2) w Reflex to ID Panel     Status: None (Preliminary result)   Collection Time: 02/22/16  3:45 PM  Result Value Ref Range Status   Specimen Description BLOOD RIGHT ASSIST CONTROL  Final   Special Requests IN PEDIATRIC BOTTLE 3CC  Final   Culture NO GROWTH < 24 HOURS  Final   Report Status PENDING  Incomplete         Radiology Studies: Dg Chest 2 View  Result Date: 02/23/2016 CLINICAL DATA:  Left lower lobe pneumonia. EXAM: CHEST  2 VIEW COMPARISON:  PA and lateral chest 02/21/2014 and 02/19/2014. FINDINGS: Left lower lobe airspace disease continues to  improve. There is marked cardiomegaly without edema. Right lung is clear. No pneumothorax or pleural fluid. Aortic atherosclerosis noted. IMPRESSION: Continued improvement in left lower lobe airspace disease. Cardiomegaly without edema. Atherosclerosis. Electronically Signed   By: Inge Rise M.D.   On: 02/23/2016 12:40   Dg Chest 2 View  Result Date: 02/22/2016 CLINICAL DATA:  Bronchitis and volume overload. EXAM: CHEST  2 VIEW COMPARISON:  February 20, 2016 FINDINGS: The left retrocardiac opacity seen previously has improved. No overt edema. Stable cardiomegaly. IMPRESSION: Improving left retrocardiac opacity.  No overt edema. Electronically Signed   By: Dorise Bullion III M.D   On: 02/22/2016 17:31        Scheduled Meds: . aspirin EC  81 mg Oral Daily  . atorvastatin  40 mg Oral QPC supper  . calcitRIOL  0.25 mcg Oral Daily  . cholecalciferol  2,000 Units Oral Daily  . clindamycin (CLEOCIN) IV  600 mg Intravenous Q8H  . Darbepoetin Alfa  100 mcg Subcutaneous Q Fri-1800  . ferrous sulfate  325 mg Oral QPC supper  . ipratropium-albuterol  3 mL Nebulization TID  . levothyroxine  112 mcg Oral QAC breakfast  . magnesium oxide  400 mg Oral Daily  . metolazone  5 mg Oral Q M,W,F  . metoprolol succinate  50 mg Oral Daily  . potassium chloride  40 mEq Oral Daily  . sertraline  100 mg Oral Daily  . torsemide  100 mg Oral Daily  . vitamin E  400 Units Oral Daily   Continuous Infusions:   LOS: 5 days    Time spent: 25    Nita Sells, MD [facility] Pager [phone]  If 7PM-7AM, please contact night-coverage [email or web address] Password [email or web address] 02/24/2016, 8:11 AM

## 2016-02-24 NOTE — Clinical Social Work Note (Signed)
CSW updated pt's grandson that pt will not be discharged today. CSW also confirmed with [facility] that bed would be available. CSW will continue to follow.   Darden Dates, MSW, LCSW  Clinical Social Worker  [phone]

## 2016-02-24 NOTE — Progress Notes (Signed)
Physical Therapy Treatment Patient Details Name: Victoria Fisher MRN: [id number] DOB: Sep 13, 1934 Today's Date: 02/24/2016    History of Present Illness 82 y.o. female who came to [facility] on 02/18/16 due to weakness and frequent falls.  Pt with increased SOB, (+) for flu.  Pt with significant PMHx of TB, Pulmonary HTN, milroy's disease (lymphatic disorder), kidney failure, frequent falls, bil LE edema, CAD, COPD, colon CA s/p resection, chronic diastolic CHF, A-fib, and chronic vertigo.     PT Comments    Pt is getting weaker, has not eaten at all today, reports nausea.  She is still able to stand and side step with RW, but I am fearful that she is getting weaker and less responsive.  She was unable to attempt even short distance gait with me and I returned her to supine in the bed.  PT will continue to follow acutely and continues to recommend SNF placement at discharge.    Follow Up Recommendations  SNF;Supervision/Assistance - 24 hour     Equipment Recommendations  None recommended by PT    Recommendations for Other Services   NA     Precautions / Restrictions Precautions Precautions: Fall Precaution Comments: pt with significant hx of frequent falls    Mobility  Bed Mobility Overal bed mobility: Needs Assistance Bed Mobility: Sidelying to Sit;Sit to Supine     Supine to sit: Mod assist;HOB elevated Sit to supine: Total assist   General bed mobility comments: Mod assist to support trunk and help progress legs over EOB.  Pt using arms to pull up on this therapist to get to sitting EOB reporting dizziness with transitions (she has chronic vertigo at baseline).   Transfers Overall transfer level: Needs assistance Equipment used: 4-wheeled walker Transfers: Sit to/from Stand Sit to Stand: Mod assist         General transfer comment: Mod assist to support trunk over weak and painful legs as well as stabilize RW.    Ambulation/Gait Ambulation/Gait assistance: Mod  assist Ambulation Distance (Feet): 3 Feet Assistive device: 4-wheeled walker Gait Pattern/deviations: Step-to pattern;Trunk flexed     General Gait Details: We side stepped to the [facility] to get in a good position to return to supine.  Mod assist for balance and control of rollator to side step a few times towards the [facility]. O2 left on and O2 sats stable on 3 L O2 [place] throughout mobility.           Balance Overall balance assessment: Needs assistance Sitting-balance support: Feet supported;Single extremity supported Sitting balance-Leahy Scale: Poor Sitting balance - Comments: Mod to max assist EOB to maintain sitting balance.  Pt very lethargic reporting nausea, unable to hold head up and eyes closed.  Postural control: Other (comment) (anterior lean with kyphotic posture and nodding off forward) Standing balance support: Bilateral upper extremity supported Standing balance-Leahy Scale: Poor Standing balance comment: mod assist to support trunk and RW in standing.                     Cognition Arousal/Alertness: Lethargic Behavior During Therapy: Flat affect Overall Cognitive Status: Impaired/Different from baseline                 General Comments: Pt is very lethargic.  She is having a hard time keeping her eyes open.  She sometimes makes sense and sometime mumbles.  She was not aware her husband was no longer here in the hospital.        General Comments  General comments (skin integrity, edema, etc.): Pt has not eaten at all today.  RN notified of nausea and brought something for it while PT was in room.       Pertinent Vitals/Pain Pain Assessment: Faces Faces Pain Scale: Hurts little more Pain Location: bil legs Pain Descriptors / Indicators: Aching;Burning Pain Intervention(s): Limited activity within patient's tolerance;Monitored during session;Repositioned           PT Goals (current goals can now be found in the care plan section) Acute Rehab PT  Goals Patient Stated Goal: to be able to see her husband Progress towards PT goals: Not progressing toward goals - comment (pt is weaker today)    Frequency    Min 2X/week      PT Plan Frequency needs to be updated       End of Session Equipment Utilized During Treatment: Oxygen Activity Tolerance: Patient limited by fatigue;Patient limited by lethargy Patient left: in bed;with call bell/phone within reach;with bed alarm set     Time: 1500-1529 PT Time Calculation (min) (ACUTE ONLY): 29 min  Charges:  $Therapeutic Activity: 23-37 mins                      Willamae Demby B. [place], [place], DPT [phone]   02/24/2016, 4:02 PM

## 2016-02-25 ENCOUNTER — Inpatient Hospital Stay (HOSPITAL_COMMUNITY): Payer: Medicare Other

## 2016-02-25 ENCOUNTER — Ambulatory Visit: Payer: Self-pay | Admitting: Cardiovascular Disease

## 2016-02-25 LAB — BLOOD GAS, ARTERIAL
ACID-BASE EXCESS: 12 mmol/L — AB (ref 0.0–2.0)
Acid-Base Excess: 11.7 mmol/L — ABNORMAL HIGH (ref 0.0–2.0)
Bicarbonate: 38.6 mmol/L — ABNORMAL HIGH (ref 20.0–28.0)
Bicarbonate: 37.1 mmol/L — ABNORMAL HIGH (ref 20.0–28.0)
DRAWN BY: 313941
Drawn by: 244851
FIO2: 31
O2 Content: 5 L/min
O2 SAT: 88.2 %
O2 Saturation: 90.1 %
Patient temperature: 98.3
Patient temperature: 98.4
pCO2 arterial: 84.5 mmHg (ref 32.0–48.0)
pCO2 arterial: 59.3 mmHg — ABNORMAL HIGH (ref 32.0–48.0)
pH, Arterial: 7.281 — ABNORMAL LOW (ref 7.350–7.450)
pH, Arterial: 7.413 (ref 7.350–7.450)
pO2, Arterial: 63.4 mmHg — ABNORMAL LOW (ref 83.0–108.0)
pO2, Arterial: 52.1 mmHg — ABNORMAL LOW (ref 83.0–108.0)

## 2016-02-25 LAB — BASIC METABOLIC PANEL
Anion gap: 9 (ref 5–15)
BUN: 76 mg/dL — ABNORMAL HIGH (ref 6–20)
CHLORIDE: 94 mmol/L — AB (ref 101–111)
CO2: 36 mmol/L — AB (ref 22–32)
Calcium: 9.7 mg/dL (ref 8.9–10.3)
Creatinine, Ser: 1.6 mg/dL — ABNORMAL HIGH (ref 0.44–1.00)
GFR calc non Af Amer: 30 mL/min — ABNORMAL LOW (ref >60)
GFR, EST AFRICAN AMERICAN: 34 mL/min — AB (ref >60)
GLUCOSE: 101 mg/dL — AB (ref 65–99)
Potassium: 4.2 mmol/L (ref 3.5–5.1)
Sodium: 139 mmol/L (ref 135–145)

## 2016-02-25 LAB — PATHOLOGIST SMEAR REVIEW

## 2016-02-25 LAB — MRSA PCR SCREENING: MRSA by PCR: POSITIVE — AB

## 2016-02-25 MED ORDER — METOPROLOL TARTRATE 5 MG/5ML IV SOLN
2.5000 mg | Freq: Four times a day (QID) | INTRAVENOUS | Status: DC
  Administered 2016-02-25 – 2016-03-05 (×36): 2.5 mg via INTRAVENOUS
  Filled 2016-02-25 (×36): qty 5

## 2016-02-25 MED ORDER — MUPIROCIN 2 % EX OINT
1.0000 "application " | TOPICAL_OINTMENT | Freq: Two times a day (BID) | CUTANEOUS | Status: AC
  Administered 2016-02-25 – 2016-03-01 (×10): 1 via NASAL
  Filled 2016-02-25 (×2): qty 22

## 2016-02-25 MED ORDER — CLINDAMYCIN PHOSPHATE 600 MG/50ML IV SOLN
600.0000 mg | Freq: Three times a day (TID) | INTRAVENOUS | Status: DC
  Administered 2016-02-25 – 2016-02-26 (×3): 600 mg via INTRAVENOUS
  Filled 2016-02-25 (×4): qty 50

## 2016-02-25 MED ORDER — LEVOTHYROXINE SODIUM 100 MCG IV SOLR
75.0000 ug | Freq: Every day | INTRAVENOUS | Status: AC
  Administered 2016-02-25: 10:00:00 via INTRAVENOUS
  Administered 2016-02-26 – 2016-03-04 (×8): 75 ug via INTRAVENOUS
  Filled 2016-02-25 (×9): qty 5

## 2016-02-25 MED ORDER — KETOROLAC TROMETHAMINE 15 MG/ML IJ SOLN
15.0000 mg | Freq: Four times a day (QID) | INTRAMUSCULAR | Status: DC
  Administered 2016-02-25 – 2016-02-26 (×4): 15 mg via INTRAVENOUS
  Filled 2016-02-25 (×4): qty 1

## 2016-02-25 MED ORDER — WHITE PETROLATUM GEL
Status: AC
  Administered 2016-02-25: 16:00:00
  Filled 2016-02-25: qty 1

## 2016-02-25 MED ORDER — NALOXONE HCL 0.4 MG/ML IJ SOLN
0.4000 mg | INTRAMUSCULAR | Status: DC | PRN
  Administered 2016-02-25: 0.4 mg via INTRAVENOUS
  Filled 2016-02-25: qty 1

## 2016-02-25 MED ORDER — CHLORHEXIDINE GLUCONATE CLOTH 2 % EX PADS
6.0000 | MEDICATED_PAD | Freq: Every day | CUTANEOUS | Status: AC
  Administered 2016-02-26 – 2016-03-01 (×5): 6 via TOPICAL

## 2016-02-25 NOTE — Progress Notes (Signed)
Patient more lethargic this morning. Patient only responds to sternal rub and painful stimuli.  O2 sating 88-92 on 5L nasal canula. Rapid response RN notified and  MD on call paged. MD ordered Blood gases and chest x-ray. MD notified of results of chest x-ray and blood gases. Will continue to await orders.

## 2016-02-25 NOTE — Significant Event (Signed)
Rapid Response Event Note  Overview: Time Called: 0810 Arrival Time: 0810 Event Type: Respiratory  Initial Focused Assessment: Patient with increasing lethargy over the past 24 hours.  This morning she is difficult to arouse and has labored breathing.  Increased O2 needs overnight. BP 122/84  HR 115  RR 16  O2 sat 97% on NRB Lung sounds rhonchi/diminished    Interventions: 0.4 mg Narcan given IV Patient now awake and agitated,and confused, calling for "Daddy" Placed patient on 2l [place] O2 sats 80%  Increased O2 to 30% Venti mask O2 sat 88-90% She continues to be agitated and calling out. Bilat mittens placed 1100: Her confusion is slowly improving, she is currently oriented x2 Repeat ABG 7.4/59/52/37 on 30% Venti,  O2 sats 87-91% Tylenol given for leg pain Increased O2 to 35% Venti  O2 sats 92-93%     Plan of Care (if not transferred): Will continue to monitor patient until she transfers to SDU Continuous pulse ox  Event Summary: Name of Physician Notified: Dr Verlon Au at bedside at         Raliegh Ip

## 2016-02-25 NOTE — Progress Notes (Signed)
Patient seen and examined and more oriented Probably will aspirate based on speech therapy and current Discussed with grandson- HCPOA Will d/w Son   Verneita Griffes, MD [facility] [phone]

## 2016-02-25 NOTE — Progress Notes (Signed)
Modified Barium Swallow Progress Note  Patient Details  Name: AYSE SPROUSE MRN: [id number] Date of Birth: 1934/12/30  Today's Date: 02/25/2016  Modified Barium Swallow completed.  Full report located under Chart Review in the Imaging Section.  Brief recommendations include the following:  Clinical Impression  Pt exhibited a suspected primary esophageal dysphagia marked byf esophageal column full of barium after 3 trials thin and puree. Esophageal residue reached the level of the patients cervical esophagus with questionable entrace to pyriform sinuses. Suspect pt will or has been penetrating/aspirating post prandial especially due to pt's suboptimal trunk and head positioning. Decreased pharyngeal sensation due to swallow initiation at the level of the pyriforms; mild vallecular residue. Pt will likely aspirate all consistencies. SLP spoke with MD. If po's are continued, thin liquid diet would be recommended over solids or purees due to esophageal residue. ST will follow up next date for decision re: food/liquid. Pt may be a candidate for Palliative care given results of MBS.    Swallow Evaluation Recommendations       SLP Diet Recommendations: NPO (liquid diet if comfort feeds)                                Mick Sell, Orbie Pyo 02/25/2016,4:31 PM   Orbie Pyo Colvin Caroli.Ed [employer] [phone]

## 2016-02-25 NOTE — Significant Event (Signed)
Rapid Response Event Note RN called for more lethargic this am and sats slowly trending down this am Overview: Time Called: 0602 Arrival Time: 0605 Event Type: Respiratory, Neurologic  Initial Focused Assessment: On arrival pt sleeping, able to arouse with with shaking pt's arm/shoulder. Pt alert to self and place only. Pt diminished on Left side and rhonchi noted through out. BP 109/70, HR 82, RR 18, 100% 5L [place]   Interventions: CXR and ABG 7.28/84.5/63.4/38.6 Plan of Care (if not transferred):  Schorr NP paged with results by primary RN. Report given to Caryl Pina RN to call as needed and Cyndia Diver RN will follow up.  Event Summary: Name of Physician Notified: Lamar Blinks NP  at  (paged PTA RRT )    at    Outcome: Other (Comment) (report off to day RRT )  Event End Time: [phone]  Armen Pickup

## 2016-02-25 NOTE — Consult Note (Signed)
   [facility] CM Inpatient Consult   02/25/2016  MARLOW BOWE May 04, 1934 [id number]   Follow up note:  Chart reviewed and patient is likely to transfer to Stepdown given the events noted.  Patient active with [facility] prior to admission.  Notes reveals patient's current disposition for discharge planning is for a skilled facility at [facility].  Spoke with inpatient RNCM, Lambert Keto, regarding [place] [facility] ongoing community follow up, as appropriate.  Will continue to follow for progress.  [place], Olin Hauser,  aware of patient and hospitalization.  Please refer to community notes, if needed.   For questions, please contact:  Natividad Brood, RN BSN [facility] Liaison  [phone] business mobile phone Toll free office [phone] '

## 2016-02-25 NOTE — Progress Notes (Signed)
This encounter was created in error - please disregard.

## 2016-02-25 NOTE — Progress Notes (Signed)
Administered Narcan per Md order. Pt alert to self. Restless and agitated. She remains on 02 via mask O2 sat 93%. Rapid nurse at bedside to assist. Will continue to monitor.

## 2016-02-25 NOTE — Progress Notes (Signed)
Pt transferred to 4E03. Report given to nurse, Shirlean Mylar.

## 2016-02-25 NOTE — Progress Notes (Signed)
Called to beside to assess patient for potential BIPAP. Rapid response rn at bedside as well as MD. MD stated RN was going to administer Narcan. After administering Narcan, pt awake. MD asked to reassess ABG at 10am to see if Co2 improves.

## 2016-02-25 NOTE — Progress Notes (Signed)
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  Discharge weight 68.5--wght this admit 74 kg Coronary artery disease CO2 edema Chronic kidney disease stage III Lymphedema  FLU + with bronchitis and some volume overload   Assessment & Plan:   Principal Problem:   Acute respiratory failure with hypoxia (HCC) Active Problems:   Essential hypertension   COPD (chronic obstructive pulmonary disease) (HCC)   Pulmonary hypertension   CKD (chronic kidney disease), stage III   Coronary artery disease   Chronic cor pulmonale (HCC)   Atrial fibrillation, chronic (HCC)   Obstructive sleep apnea   Cellulitis of lower extremity   Acute bronchitis   Bronchitis  Acute hypercarbic respiratory failure-might need BiPAP, given Narcan successfully 1 0.4 milligrams. We will close monitor on telemetry and might need to transfer to step down --?positive pressure ventilation if does not wake up enough and may need Narcan drip-rapid response and primary and is aware.  Acute metabolic encephalopathy secondary to meds and hypercarbia Discontinue morphine, discontinue sertraline 100 daily  Influenza A positive- Probable aspiration given right upper lobe infiltrate 1/9  Initially was on Tamiflu completed course 1/7 change doxycycline--> Zosyn and based on get 2 vw CXR showing left lower lobe pneumonia. Transitioned to Ceftin 02/21/16 d/t hypothermia rpt CXR 2 VW and re-start as clindamycin as PO 02/25/16. Now the x-ray 1/9 showing right upper lobe pneumonia Patient has continued to desaturate 1/8 -ultimately found to have hypercarbia .  eventually will need  Speech therapy consult requested   Recent falls, left scalp hematoma-PT recommends skilled placement-d/w patient-considering this as option  Permanent atrial fibrillation [place] score >4 not on anticoagulation secondary to falls-continue Toprol-XL 50 daily--transitioned to metoprolol IV 2.5 every 6 scheduled    Chr Diastolic- home dose torsemide 100 3 times a day-weight on admission was 74 kg at last admission was 68.5--current weight is 71.8 -patient is  +1.1 Liter increase torsemide dose to 100 mg od-->100 twice a day restarted metolazone 5 Monday Wednesday Friday-obtained chemistry in a.m--in setting of nothing by mouth state, lethargy we will hold torsemide going forward 02/25/16--she might need fluid for her aspiration event  Milroy disease-lymphatic disorder-takes Chr MSIR for this-has been cut back from 100 mg as an outpatient to 30mg  in the hospital  I have cut her back 1/6 15 mg tid--we have completely discontinued her morphine and sertraline given metabolic encephalopathy. Continue Unna boot dressings    Hypothyroidism continue Synthroid 112 every morning--IV formulation 40mcg CAD  Tongue ulcer-we will place Orajel order for patient  Malnutrition probably secondary from this of moderate degree  Chronic lymphedema   Frequent falls Chronic pain-opiate use disorder-see s/w notes  Thrombocytopenia-unclear etiology. Obtain platelet smear. Possible related to flulike illness versus myelofibrosis however platelets have remained in the 70-80 range  DVT prophylaxis: SCD's Code Status: Full Family Communication: Son not present I did have a talk with him and his nephew who are powers of attorney and a limited trial at resuscitation performed Disposition Plan: Patient is not ready for discharge Consultants:   none  Procedures:   none  Antimicrobials:   tamiflu 1/3  Doxycylcine 1/3  Ceftin 1/6  Clindamycin 1/6   Subjective:  She is lethargic on some earlier today rapid  response called Narcan given patient more awake alert Still confused   Objective: Vitals:   02/25/16 0356 02/25/16 0500 02/25/16 0551 02/25/16 0827  BP: 124/81  109/70 122/84  Pulse: (!) 108  82 (!) 104  Resp: 18  18 20   Temp: 98.5 F (36.9 C)  98.3 F (36.8 C) 98.4 F (36.9 C)  TempSrc: Oral  Axillary Rectal  SpO2: 92%  100% 95%  Weight:  71.2 kg (157 lb)    Height:       No intake or output data in the 24 hours ending 02/25/16 0843 Filed Weights   02/21/16 0437 02/23/16 0500 02/25/16 0500  Weight: 74.9 kg (165 lb 3.2 oz) 71.8 kg (158 lb 4.8 oz) 71.2 kg (157 lb)    Examination:  General exam: Appears calm- improved from prior, tongue Seems stable Respiratory system: Clear to auscultation. Respiratory effort improved some mild crackles Cardiovascular system: S1 & S2 heard, RRR. No JVD No pedal edema-legs were wrapped and unwrapped Gastrointestinal system: Abdomen is nondistended, soft and nontender. No organomegaly or masses Normal bowel sounds heard. Central nervous system: Alert and oriented. No focal neurological deficits. Extremities: Symmetric 5 x 5 power. Skin: not examined today Psychiatry: Judgement and insight appear normal. Mood & affect appropriate.     Data Reviewed: I have personally reviewed following labs and imaging studies  CBC:  Recent Labs Lab 02/18/16 2220 02/19/16 0533 02/20/16 0649 02/21/16 0614 02/22/16 1540 02/23/16 0710 02/24/16 0440  WBC 3.8* 3.7* 6.5 4.7 4.5 5.3 5.3  NEUTROABS 2.6 2.3  --  3.3  --   --   --   HGB 12.8 12.0 12.0 12.1 11.9* 11.9* 12.1  HCT 39.0 37.4 38.9 39.0 38.3 39.5 41.0  MCV 101.0* 100.3* 104.3* 104.0* 105.2* 107.3* 109.9*  PLT 73* 90* 87* 83* 74* 76* 81*   Basic Metabolic Panel:  Recent Labs Lab 02/18/16 2220 02/19/16 0533 02/20/16 0649 02/21/16 0614 02/25/16 0525  NA 135 138 136 139 139  K 3.0* 2.8* 4.2 3.6 4.2  CL 91* 94* 97* 97* 94*  CO2 25 29 28  32 36*  GLUCOSE 80 81 102* 97 101*  BUN 75* 75*  83* 81* 76*  CREATININE 1.61* 1.57* 1.58* 1.56* 1.60*  CALCIUM 9.8 9.4 9.0 9.2 9.7   GFR: Estimated Creatinine Clearance: 27.6 mL/min (by C-G formula based on SCr of 1.6 mg/dL (H)). Liver Function Tests:  Recent Labs Lab 02/18/16 2220 02/19/16 0533  AST 50* 54*  ALT 16 15  ALKPHOS 70 63  BILITOT 1.4* 1.5*  PROT 7.2 6.5  ALBUMIN 3.3* 3.1*   No results for input(s): LIPASE, AMYLASE in the last 168 hours. No results for input(s): AMMONIA in the last 168 hours. Coagulation Profile: No results for input(s): INR, PROTIME in the last 168 hours. Cardiac Enzymes:  Recent Labs Lab 02/19/16 0533 02/19/16 1226 02/19/16 1840  TROPONINI 0.04* 0.04* 0.04*   BNP (last 3 results) No results for input(s): PROBNP in the last 8760 hours. HbA1C: No results for input(s): HGBA1C in the last 72 hours. CBG: No results for input(s): GLUCAP in the last 168 hours. Lipid Profile: No results for input(s): CHOL, HDL, LDLCALC, TRIG, CHOLHDL, LDLDIRECT in the last 72 hours. Thyroid Function Tests: No results for input(s): TSH, T4TOTAL, FREET4, T3FREE, THYROIDAB in the last 72 hours. Anemia Panel: No results for input(s): VITAMINB12, FOLATE, FERRITIN, TIBC, IRON, RETICCTPCT in the last  72 hours. Sepsis Labs:  Recent Labs Lab 02/19/16 0210  LATICACIDVEN 1.10    Recent Results (from the past 240 hour(s))  Urine culture     Status: Abnormal   Collection Time: 02/18/16 11:58 PM  Result Value Ref Range Status   Specimen Description URINE, CATHETERIZED  Final   Special Requests NONE  Final   Culture <10,000 COLONIES/mL INSIGNIFICANT GROWTH (A)  Final   Report Status 02/20/2016 FINAL  Final  Culture, blood (Routine X 2) w Reflex to ID Panel     Status: None (Preliminary result)   Collection Time: 02/22/16  3:40 PM  Result Value Ref Range Status   Specimen Description BLOOD LEFT ASSIST CONTROL  Final   Special Requests IN PEDIATRIC BOTTLE 3CC  Final   Culture NO GROWTH 2 DAYS  Final   Report  Status PENDING  Incomplete  Culture, blood (Routine X 2) w Reflex to ID Panel     Status: None (Preliminary result)   Collection Time: 02/22/16  3:45 PM  Result Value Ref Range Status   Specimen Description BLOOD RIGHT ASSIST CONTROL  Final   Special Requests IN PEDIATRIC BOTTLE 3CC  Final   Culture NO GROWTH 2 DAYS  Final   Report Status PENDING  Incomplete         Radiology Studies: Dg Chest 2 View  Result Date: 02/23/2016 CLINICAL DATA:  Left lower lobe pneumonia. EXAM: CHEST  2 VIEW COMPARISON:  PA and lateral chest 02/21/2014 and 02/19/2014. FINDINGS: Left lower lobe airspace disease continues to improve. There is marked cardiomegaly without edema. Right lung is clear. No pneumothorax or pleural fluid. Aortic atherosclerosis noted. IMPRESSION: Continued improvement in left lower lobe airspace disease. Cardiomegaly without edema. Atherosclerosis. Electronically Signed   By: Inge Rise M.D.   On: 02/23/2016 12:40   Dg Chest Port 1 View  Result Date: 02/25/2016 CLINICAL DATA:  Asthma-COPD. Respiratory distress. History of atrial fibrillation, CHF, pulmonary hypertension, colonic malignancy. EXAM: PORTABLE CHEST 1 VIEW COMPARISON:  Chest x-ray of February 23, 2016 FINDINGS: The lungs are adequately inflated. The interstitial markings on the right are more conspicuous today. The cardiac silhouette remains enlarged. The pulmonary vascularity is not engorged. A small right pleural effusion has developed. There is calcification in the wall of the aortic arch. The observed bony thorax is unremarkable. IMPRESSION: Patchy increased interstitial density on the right may reflect early interstitial pneumonia or asymmetric pulmonary edema new small right pleural effusion. Stable cardiomegaly. Thoracic aortic atherosclerosis. Electronically Signed   By: David  Martinique M.D.   On: 02/25/2016 07:04        Scheduled Meds: . aspirin EC  81 mg Oral Daily  . clindamycin  300 mg Oral Q8H  . Darbepoetin  Alfa  100 mcg Subcutaneous Q Fri-1800  . ferrous sulfate  325 mg Oral QPC supper  . ipratropium-albuterol  3 mL Nebulization TID   Continuous Infusions:   LOS: 6 days    Time spent: Clarke, Baldwin, MD [facility] Pager [phone]  If 7PM-7AM, please contact night-coverage [email or web address] Password [facility] 02/25/2016, 8:43 AM

## 2016-02-26 ENCOUNTER — Inpatient Hospital Stay (HOSPITAL_COMMUNITY): Payer: Medicare Other

## 2016-02-26 DIAGNOSIS — L8990 Pressure ulcer of unspecified site, unspecified stage: Secondary | ICD-10-CM | POA: Insufficient documentation

## 2016-02-26 DIAGNOSIS — J9601 Acute respiratory failure with hypoxia: Secondary | ICD-10-CM

## 2016-02-26 LAB — BLOOD GAS, ARTERIAL
ACID-BASE EXCESS: 11.9 mmol/L — AB (ref 0.0–2.0)
Acid-Base Excess: 11.3 mmol/L — ABNORMAL HIGH (ref 0.0–2.0)
Bicarbonate: 38.2 mmol/L — ABNORMAL HIGH (ref 20.0–28.0)
Bicarbonate: 37.2 mmol/L — ABNORMAL HIGH (ref 20.0–28.0)
DELIVERY SYSTEMS: POSITIVE
Drawn by: 398981
Drawn by: 312971
EXPIRATORY PAP: 6
FIO2: 100
FIO2: 60
INSPIRATORY PAP: 18
O2 Content: 15 L/min
O2 SAT: 99.1 %
O2 Saturation: 94.7 %
PATIENT TEMPERATURE: 98.6
PCO2 ART: 75.6 mmHg — AB (ref 32.0–48.0)
PH ART: 7.347 — AB (ref 7.350–7.450)
PO2 ART: 167 mmHg — AB (ref 83.0–108.0)
Patient temperature: 98.6
pCO2 arterial: 69.6 mmHg (ref 32.0–48.0)
pH, Arterial: 7.324 — ABNORMAL LOW (ref 7.350–7.450)
pO2, Arterial: 80.3 mmHg — ABNORMAL LOW (ref 83.0–108.0)

## 2016-02-26 LAB — BASIC METABOLIC PANEL
Anion gap: 11 (ref 5–15)
BUN: 87 mg/dL — AB (ref 6–20)
CHLORIDE: 94 mmol/L — AB (ref 101–111)
CO2: 35 mmol/L — AB (ref 22–32)
CREATININE: 2.06 mg/dL — AB (ref 0.44–1.00)
Calcium: 9.6 mg/dL (ref 8.9–10.3)
GFR calc Af Amer: 25 mL/min — ABNORMAL LOW (ref >60)
GFR calc non Af Amer: 22 mL/min — ABNORMAL LOW (ref >60)
Glucose, Bld: 127 mg/dL — ABNORMAL HIGH (ref 65–99)
Potassium: 4.1 mmol/L (ref 3.5–5.1)
Sodium: 140 mmol/L (ref 135–145)

## 2016-02-26 LAB — CBC
HEMATOCRIT: 40.8 % (ref 36.0–46.0)
Hemoglobin: 12.9 g/dL (ref 12.0–15.0)
MCH: 32.8 pg (ref 26.0–34.0)
MCHC: 31.6 g/dL (ref 30.0–36.0)
MCV: 103.8 fL — ABNORMAL HIGH (ref 78.0–100.0)
Platelets: 92 10*3/uL — ABNORMAL LOW (ref 150–400)
RBC: 3.93 MIL/uL (ref 3.87–5.11)
RDW: 15.9 % — ABNORMAL HIGH (ref 11.5–15.5)
WBC: 6.6 10*3/uL (ref 4.0–10.5)

## 2016-02-26 LAB — TROPONIN I

## 2016-02-26 LAB — PROCALCITONIN: Procalcitonin: 0.19 ng/mL

## 2016-02-26 MED ORDER — VANCOMYCIN HCL IN DEXTROSE 750-5 MG/150ML-% IV SOLN
750.0000 mg | INTRAVENOUS | Status: DC
  Administered 2016-02-27 – 2016-02-29 (×3): 750 mg via INTRAVENOUS
  Filled 2016-02-26 (×4): qty 150

## 2016-02-26 MED ORDER — MEROPENEM 1 G IV SOLR
1.0000 g | Freq: Two times a day (BID) | INTRAVENOUS | Status: AC
  Administered 2016-02-26 – 2016-03-05 (×18): 1 g via INTRAVENOUS
  Filled 2016-02-26 (×19): qty 1

## 2016-02-26 MED ORDER — FUROSEMIDE 10 MG/ML IJ SOLN
40.0000 mg | Freq: Once | INTRAMUSCULAR | Status: AC
  Administered 2016-02-26: 40 mg via INTRAVENOUS
  Filled 2016-02-26: qty 4

## 2016-02-26 MED ORDER — VANCOMYCIN HCL IN DEXTROSE 1-5 GM/200ML-% IV SOLN
1000.0000 mg | Freq: Once | INTRAVENOUS | Status: AC
  Administered 2016-02-26: 1000 mg via INTRAVENOUS
  Filled 2016-02-26: qty 200

## 2016-02-26 MED ORDER — IPRATROPIUM-ALBUTEROL 0.5-2.5 (3) MG/3ML IN SOLN
3.0000 mL | Freq: Four times a day (QID) | RESPIRATORY_TRACT | Status: DC
Start: 2016-02-26 — End: 2016-03-02
  Administered 2016-02-26 – 2016-03-02 (×20): 3 mL via RESPIRATORY_TRACT
  Filled 2016-02-26 (×20): qty 3

## 2016-02-26 NOTE — Progress Notes (Signed)
PROGRESS NOTE  Victoria Fisher  [id number] DOB: June 08, 1934  DOA: 02/18/2016 PCP: Haywood Pao, MD   Brief Narrative:  82 year old female, SNF resident, PMH of chronic diastolic CHF, cor pulmonale, HTN, chronic lymphedema, CAD, chronic kidney disease, A. fib, chronic pain syndrome, COPD, frequent falls, hypothyroid and OSA presented to ED on 02/19/16 due to dyspnea and productive cough. She was initially admitted for acute bronchitis. She continued to decline from 02/25/16. CCM was consulted and patient was transferred to ICU under CCM care on 02/26/16.   Assessment & Plan:   Principal Problem:   Acute respiratory failure with hypoxia (HCC) Active Problems:   Essential hypertension   COPD (chronic obstructive pulmonary disease) (HCC)   Pulmonary hypertension   CKD (chronic kidney disease), stage III   Coronary artery disease   Chronic cor pulmonale (HCC)   Atrial fibrillation, chronic (HCC)   Obstructive sleep apnea   Cellulitis of lower extremity   Acute bronchitis   Bronchitis   Pressure injury of skin   1. Acute hypoxic and hypercapnic respiratory failure: Multifactorial due to Influenza with acute bronchitis, COPD, pulmonary edema and aspiration: On 1/9, patient was given a dose of Narcan with improvement in mental status. Overnight events 02/25/16 appreciated. Noted increased work of breathing and hypoxia. BiPAP placed. CCM was consulted and patient was transferred to ICU on 02/26/16 for close monitoring in case she declines further and needs intubation for mechanical ventilation. Antibiotics broadened, diuresis, bronchodilator nebulizations and hold sedating medications. 2. Acute on chronic diastolic CHF: Received IV Lasix this morning. Repeating IV Lasix 40 mg 1. Monitor clinically and radiologically. Holding home dose of torsemide. 3. A. fib: Not on anticoagulation. Rate reasonably controlled. Not anticoagulation candidate due to recurrent falls. [place] Score: 4. Currently on  scheduled IV metoprolol 2.5 MG every 6 hours. 4. Acute on Stage III chronic kidney disease: Creatinine has worsened from 1.5-1.6 range to 2.06. Follow BMP closely with diuretics. 5. Aspiration pneumonia: Antibiotic management as below (broadened to vancomycin/meropenem from clindamycin). At high risk for aspiration at this time. Keep nothing by mouth. Penicillin allergic. 6. Thrombocytopenia: Stable. 7. Influenza A: Completed 5 days of Tamiflu on 1/7. 8. Hypothyroid: IV Synthroid while nothing by mouth. 9. Acute encephalopathy: Multifactorial in the setting of acute respiratory failure, hypercapnia and metabolic encephalopathy. Chronic narcotic use. Responded to Narcan on 1/9. Hold sedating medications. Continue BiPAP. Monitor CO2. Sertraline also discontinued for now as well as morphine. 10. Dementia: Baseline mentation not clear. This needs to be verified with family. 11. Recurrent falls/recent fall: Left scalp hematoma by CT. PT recommends SNF placement. 12. Chronic lymphedema/? Milroy disease: Continue Una boots 13. Chronic pain: Management as indicated above.  14. Left leg wound: per WOC  DVT prophylaxis:  SCDs Code Status:  Full Family Communication:  None at bedside. Dr. Verlon Au had discussed with patient's son and nephew on 03/06/16. Disposition Plan:  Clinically worsened on 1/10 and was transferred from stepdown unit to ICU under care of CCM. [place] signing off on 1/10.   Consultants:   CCM  Procedures:   BiPAP  Antimicrobials:   Ceftin 1/5-1/6  Clindamycin 1/4-1/9   Doxycycline 1/2-1/3  Tamiflu-completed 5 day course  Meropenem and vancomycin 1/10 >   Subjective:  Seen this morning. Somnolent but arousable and oriented to self. Overnight events noted, remained hypoxic and required BiPAP.   Objective:  Vitals:   02/26/16 1315 02/26/16 1330 02/26/16 1400 02/26/16 1536  BP: 129/90 138/86    Pulse:    [name])  102  Resp:   15 (!) 30  Temp:      TempSrc:      SpO2:     97%  Weight:      Height:        Intake/Output Summary (Last 24 hours) at 02/26/16 1618 Last data filed at 02/26/16 [id number]  Gross per 24 hour  Intake              200 ml  Output                0 ml  Net              200 ml   Filed Weights   02/23/16 0500 02/25/16 0500 02/25/16 1516  Weight: 71.8 kg (158 lb 4.8 oz) 71.2 kg (157 lb) 68.7 kg (151 lb 7.3 oz)    Examination:  General exam: Chronically ill-looking elderly female lying comfortably propped up in bed with BiPAP in place but no respiratory distress. Respiratory system:  Reduced breath sounds bilaterally, especially in the bases with occasional basal crackles Respiratory effort normal. Cardiovascular system: S1 & S2 heard, Irregularly irregular. No JVD, murmurs, rubs, gallops or clicks.  Unable to assess pedal edema due to bilateral lower extremity Una boots. Telemetry: A. fib with ventricular rate in the 90s-100s. Gastrointestinal system: Abdomen is nondistended, soft and nontender. No organomegaly or masses felt. Normal bowel sounds heard. Central nervous system: somnolent but easily arousable and oriented only to self. No focal neurological deficits. Extremities:  Moves all extremities symmetrically Skin:  Left infraorbital ecchymosis/bruising-does not seem new. Psychiatry: Judgement and insight appear normal. Mood & affect appropriate.     Data Reviewed: I have personally reviewed following labs and imaging studies  CBC:  Recent Labs Lab 02/21/16 0614 02/22/16 1540 02/23/16 0710 02/24/16 0440 02/26/16 0831  WBC 4.7 4.5 5.3 5.3 6.6  NEUTROABS 3.3  --   --   --   --   HGB 12.1 11.9* 11.9* 12.1 12.9  HCT 39.0 38.3 39.5 41.0 40.8  MCV 104.0* 105.2* 107.3* 109.9* 103.8*  PLT 83* 74* 76* 81* 92*   Basic Metabolic Panel:  Recent Labs Lab 02/20/16 0649 02/21/16 0614 02/25/16 0525 02/26/16 0831  NA 136 139 139 140  K 4.2 3.6 4.2 4.1  CL 97* 97* 94* 94*  CO2 28 32 36* 35*  GLUCOSE 102* 97 101* 127*  BUN 83*  81* 76* 87*  CREATININE 1.58* 1.56* 1.60* 2.06*  CALCIUM 9.0 9.2 9.7 9.6   GFR: Estimated Creatinine Clearance: 21.1 mL/min (by C-G formula based on SCr of 2.06 mg/dL (H)). Liver Function Tests: No results for input(s): AST, ALT, ALKPHOS, BILITOT, PROT, ALBUMIN in the last 168 hours. No results for input(s): LIPASE, AMYLASE in the last 168 hours. No results for input(s): AMMONIA in the last 168 hours. Coagulation Profile: No results for input(s): INR, PROTIME in the last 168 hours. Cardiac Enzymes:  Recent Labs Lab 02/19/16 1840 02/26/16 0951  TROPONINI 0.04* <0.03   BNP (last 3 results) No results for input(s): PROBNP in the last 8760 hours. HbA1C: No results for input(s): HGBA1C in the last 72 hours. CBG: No results for input(s): GLUCAP in the last 168 hours. Lipid Profile: No results for input(s): CHOL, HDL, LDLCALC, TRIG, CHOLHDL, LDLDIRECT in the last 72 hours. Thyroid Function Tests: No results for input(s): TSH, T4TOTAL, FREET4, T3FREE, THYROIDAB in the last 72 hours. Anemia Panel: No results for input(s): VITAMINB12, FOLATE, FERRITIN, TIBC, IRON, RETICCTPCT in the last 72  hours.  Sepsis Labs:  Recent Labs Lab 02/22/16 1540 02/23/16 0710 02/24/16 0440 02/26/16 0831 02/26/16 0951  PROCALCITON  --   --   --   --  0.19  WBC 4.5 5.3 5.3 6.6  --     Recent Results (from the past 240 hour(s))  Urine culture     Status: Abnormal   Collection Time: 02/18/16 11:58 PM  Result Value Ref Range Status   Specimen Description URINE, CATHETERIZED  Final   Special Requests NONE  Final   Culture <10,000 COLONIES/mL INSIGNIFICANT GROWTH (A)  Final   Report Status 02/20/2016 FINAL  Final  Culture, blood (Routine X 2) w Reflex to ID Panel     Status: None (Preliminary result)   Collection Time: 02/22/16  3:40 PM  Result Value Ref Range Status   Specimen Description BLOOD LEFT ASSIST CONTROL  Final   Special Requests IN PEDIATRIC BOTTLE 3CC  Final   Culture NO GROWTH 4  DAYS  Final   Report Status PENDING  Incomplete  Culture, blood (Routine X 2) w Reflex to ID Panel     Status: None (Preliminary result)   Collection Time: 02/22/16  3:45 PM  Result Value Ref Range Status   Specimen Description BLOOD RIGHT ASSIST CONTROL  Final   Special Requests IN PEDIATRIC BOTTLE 3CC  Final   Culture NO GROWTH 4 DAYS  Final   Report Status PENDING  Incomplete  MRSA PCR Screening     Status: Abnormal   Collection Time: 02/25/16  3:42 PM  Result Value Ref Range Status   MRSA by PCR POSITIVE (A) NEGATIVE Final    Comment:        The GeneXpert MRSA Assay (FDA approved for NASAL specimens only), is one component of a comprehensive MRSA colonization surveillance program. It is not intended to diagnose MRSA infection nor to guide or monitor treatment for MRSA infections. RESULT CALLED TO, READ BACK BY AND VERIFIED WITHOfilia Neas RN [id number] 02/25/16 A BROWNING          Radiology Studies: Dg Chest Port 1 View  Result Date: 02/26/2016 CLINICAL DATA:  Shortness of breath and respiratory distress EXAM: PORTABLE CHEST 1 VIEW COMPARISON:  Chest radiograph 02/25/2016 FINDINGS: Cardiomegaly can't aortic atherosclerosis are unchanged. Interstitial opacities in the right lung have worsened. Suspected lingular atelectasis has increased. No pneumothorax or sizable pleural effusion. IMPRESSION: Massive cardiomegaly, aortic atherosclerosis and slightly worsening pulmonary edema. Electronically Signed   By: Ulyses Jarred M.D.   On: 02/26/2016 03:30   Dg Chest Port 1 View  Result Date: 02/25/2016 CLINICAL DATA:  Asthma-COPD. Respiratory distress. History of atrial fibrillation, CHF, pulmonary hypertension, colonic malignancy. EXAM: PORTABLE CHEST 1 VIEW COMPARISON:  Chest x-ray of February 23, 2016 FINDINGS: The lungs are adequately inflated. The interstitial markings on the right are more conspicuous today. The cardiac silhouette remains enlarged. The pulmonary vascularity is not  engorged. A small right pleural effusion has developed. There is calcification in the wall of the aortic arch. The observed bony thorax is unremarkable. IMPRESSION: Patchy increased interstitial density on the right may reflect early interstitial pneumonia or asymmetric pulmonary edema new small right pleural effusion. Stable cardiomegaly. Thoracic aortic atherosclerosis. Electronically Signed   By: David  Martinique M.D.   On: 02/25/2016 07:04   Dg Swallowing Func-speech Pathology  Result Date: 02/25/2016 Objective Swallowing Evaluation: Type of Study: MBS-Modified Barium Swallow Study Patient Details Name: Victoria Fisher MRN: [id number] Date of Birth: [birth year] Today's Date: 02/25/2016 Time: SLP  Start Time (ACUTE ONLY): 1350-SLP Stop Time (ACUTE ONLY): 1411 SLP Time Calculation (min) (ACUTE ONLY): 21 min Past Medical History: Past Medical History: Diagnosis Date . Abnormality of gait 01/30/2015 . Anemia in CKD (chronic kidney disease)  . Anxiety  . Arthritis  . Asthma   On nebulizer . Atrial fibrillation, chronic ([place])   a. Rate controlled w/ BB. No OAC 2/2 falls and gait instability. . Bowel obstruction   Caused by scar tissue . Chronic cor pulmonale (HCC)   Most likely due to COPD (cannot rule out contribution of diastolic LV dysfunction). ECHO 07/30/11 pulmonary artery pressure was estimated to be 50-60% mmHg & there was moderate tricuspid insufficiency. . Chronic diastolic CHF (congestive heart failure) ([place])   a. Predominantly right heart failure due to cor pulmonale & in turn this is most likely due to untreated OSA;  b. 05/2013 Echo: EF 55-60%, no rwma, mild to mod MR, sev dil LA, mod to sev dil RV w/ reduced systolic fxn, massively dil RA, mod-sev TR, PASP 77mmHg. [name] Chronic pain syndrome  . Colon cancer ([place])   Carcinoma of sigmoid colon 1984 & 1985. [name] COPD (chronic obstructive pulmonary disease) ([place])  . Coronary artery disease   a. occluded proximal diagonal artery on angiography 2004 . Depression  .  Diverticulosis of colon  . 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 Dyspnea on exertion   Reluctant to take diuretics . Edema of both legs   Venous Doppler 05/11/12 was normal & no evidence of thrombus or thrombophlebitis. Chronic LE edema related to both cor pulmonale & lymphedema, hyperlipidemia & HTN involving coronary atherosclerosis by cardiac cath in 2004. Cath showed a tiny old occlusion of the optional diagonal branch & also proximal LAD. [name] Frequent falls   Possibly due to orthostatic hypotension but we have not seen that on our visits. Recent fall 05/2012 with scalp hematoma, swollen left LE, & black eyes. . Hyperlipidemia   On a statin. Catheterization 02/12/03 Showed a tiny old occlusion of the optional diagonal branch & also proximal LAD. [name] Hypertension  . Hypothyroidism 2002  On hormone replacement therapy . IBS (irritable bowel syndrome)  . Insomnia  . Kidney failure 09/05/2015  Pt states that she went to the doctor on thursday and was told she is in stage 4 kidney failure . Lung nodule  . Milroy's disease   Lymphatic disorder diagnosed at [facility] . Obstructive sleep apnea   Polysomnogram 09/25/11 AHI: 53.6/hr overall & 57.1/hr during REM sleep. Poor compliance previously with CPAP . Pulmonary hypertension   Pullmonary hypertension, chronic cor pulmonale w/pulmonary artery pressures of 50&#x2011; 60 mmHg. . Tuberculosis  Past Surgical History: Past Surgical History: Procedure Laterality Date . ABDOMINAL HYSTERECTOMY  1983 . APPENDECTOMY  1956 . CARDIAC CATHETERIZATION  02/12/03  Showed a tiny old occlusion of the optional diagonal branch & also proximal LAD. [name] CHOLECYSTECTOMY  1984 . COLON BIOPSY   . COLON RESECTION    2 times . COLONOSCOPY  07/23/10 HPI: Victoria B Burnettis a 82 y.o.femalewith history of diastolic CHF, COPD, OSA, colon cancer,hypertension, chronic lymphedema CAD chronic kidney disease A. fib presents to the ER because of shortness of breath and productive cough and frequent falls. Initial ER  physician note the patient was confused for which CT head was negative for acute intracranial process. Per note pt admitted for bronchitis and lower extremity cellulitis. CXR Continued improvement in left lower lobe airspace disease. MD ordered ST eval due to desat to 70's on 02 and baseline cough (cxr  ordered 1/7). No Data Recorded Assessment / Plan / Recommendation CHL IP CLINICAL IMPRESSIONS 02/25/2016 Therapy Diagnosis Suspected primary esophageal dysphagia;Mild pharyngeal phase dysphagia Clinical Impression Pt exhibited a suspected primary esophageal dysphagia marked byf esophageal column full of barium after 3 trials thin and puree. Esophageal residue reached the level of the patients cervical esophagus with questionable entrace to pyriform sinuses. Suspect pt will or has been penetrating/aspirating post prandial especially due to pt's suboptimal trunk and head positioning. Decreased pharyngeal sensation due to swallow initiation at the level of the pyriforms; mild vallecular residue. Pt will likely aspirate all consistencies. SLP spoke with MD. If po's are continued, thin liquid diet would be recommended over solids or purees due to esophageal residue. ST will follow up next date for decision re: food/liquid. Pt may be a candidate for Palliative care given results of MBS.  Impact on safety and function Severe aspiration risk Some recent data might be hidden   CHL IP TREATMENT RECOMMENDATION 02/25/2016 Treatment Recommendations Therapy as outlined in treatment plan below Some recent data might be hidden   Prognosis 02/25/2016 Prognosis for Safe Diet Advancement Fair Barriers to Reach Goals -- Barriers/Prognosis Comment -- Some recent data might be hidden CHL IP DIET RECOMMENDATION 02/25/2016 SLP Diet Recommendations NPO Liquid Administration via -- Medication Administration -- Compensations -- Postural Changes -- Some recent data might be hidden   No flowsheet data found.  CHL IP FOLLOW UP RECOMMENDATIONS 02/25/2016  Follow up Recommendations None Some recent data might be hidden   CHL IP FREQUENCY AND DURATION 02/25/2016 Speech Therapy Frequency (ACUTE ONLY) min 1 x/week Treatment Duration 1 week Some recent data might be hidden      CHL IP ORAL PHASE 02/25/2016 Oral Phase WFL Oral - Pudding Teaspoon -- Oral - Pudding Cup -- Oral - Honey Teaspoon -- Oral - Honey Cup -- Oral - Nectar Teaspoon -- Oral - Nectar Cup -- Oral - Nectar Straw -- Oral - Thin Teaspoon -- Oral - Thin Cup -- Oral - Thin Straw -- Oral - Puree -- Oral - Mech Soft -- Oral - Regular -- Oral - Multi-Consistency -- Oral - Pill -- Oral Phase - Comment -- Some recent data might be hidden  CHL IP PHARYNGEAL PHASE 02/25/2016 Pharyngeal Phase Impaired Pharyngeal- Pudding Teaspoon -- Pharyngeal -- Pharyngeal- Pudding Cup -- Pharyngeal -- Pharyngeal- Honey Teaspoon -- Pharyngeal -- Pharyngeal- Honey Cup -- Pharyngeal -- Pharyngeal- Nectar Teaspoon -- Pharyngeal -- Pharyngeal- Nectar Cup -- Pharyngeal -- Pharyngeal- Nectar Straw -- Pharyngeal -- Pharyngeal- Thin Teaspoon -- Pharyngeal -- Pharyngeal- Thin Cup -- Pharyngeal -- Pharyngeal- Thin Straw Delayed swallow initiation-pyriform sinuses;Pharyngeal residue - valleculae Pharyngeal -- Pharyngeal- Puree Delayed swallow initiation-vallecula Pharyngeal -- Pharyngeal- Mechanical Soft -- Pharyngeal -- Pharyngeal- Regular -- Pharyngeal -- Pharyngeal- Multi-consistency -- Pharyngeal -- Pharyngeal- Pill -- Pharyngeal -- Pharyngeal Comment -- Some recent data might be hidden  CHL IP CERVICAL ESOPHAGEAL PHASE 02/25/2016 Cervical Esophageal Phase WFL Pudding Teaspoon -- Pudding Cup -- Honey Teaspoon -- Honey Cup -- Nectar Teaspoon -- Nectar Cup -- Nectar Straw -- Thin Teaspoon -- Thin Cup -- Thin Straw -- Puree -- Mechanical Soft -- Regular -- Multi-consistency -- Pill -- Cervical Esophageal Comment -- Some recent data might be hidden No flowsheet data found. [place] Siren 02/25/2016, 4:30 PM    Orbie Pyo Colvin Caroli.Ed CCC-SLP  Pager [phone]                Scheduled Meds: . Chlorhexidine Gluconate Cloth  6 each Topical Q0600  .  Darbepoetin Alfa  100 mcg Subcutaneous Q Fri-1800  . ipratropium-albuterol  3 mL Nebulization Q6H  . levothyroxine  75 mcg Intravenous Daily  . meropenem (MERREM) IV  1 g Intravenous Q12H  . metoprolol  2.5 mg Intravenous Q6H  . mupirocin ointment  1 application Nasal BID  . [START ON 02/27/2016] vancomycin  750 mg Intravenous Q24H   Continuous Infusions:   LOS: 7 days        Maylea Soria, MD [facility] Pager [phone]  If 7PM-7AM, please contact night-coverage [email or web address] Password [email or web address] 02/26/2016, 4:18 PM

## 2016-02-26 NOTE — Progress Notes (Signed)
Pt will wake to voice and can state her name and she knows she is at [facility], pt quickly goes back to sleep. Pt keeps neck turned torward the left. Pillows placed to support neck. Pt on bipap at this time, Dr Chase Caller at bedside. Plan to transfer to ICU. Consuelo Pandy RN

## 2016-02-26 NOTE — Progress Notes (Signed)
Pharmacy Antibiotic Note Victoria Fisher is  82 y.o. female admitted on 02/18/2016 with pneumonia.  Pharmacy has been consulted for meropenem and vancomycin dosing in setting of CKD.  Plan: 1. Meropenem 1 gram IV every 12 hours  2. Vancomycin 750 IV every 24 hours.  Goal trough 15-20 mcg/mL.  3. SCr every 72 hours while on vancomycin    Height: 5\' 4"  (162.6 cm) Weight: 151 lb 7.3 oz (68.7 kg) IBW/kg (Calculated) : 54.7   Temp (24hrs), Avg:97.9 F (36.6 C), Min:97.4 F (36.3 C), Max:99.2 F (37.3 C)   Recent Labs Lab 02/20/16 0649 02/21/16 0614 02/22/16 1540 02/23/16 0710 02/24/16 0440 02/25/16 0525  WBC 6.5 4.7 4.5 5.3 5.3  --   CREATININE 1.58* 1.56*  --   --   --  1.60*    Estimated Creatinine Clearance: 27.1 mL/min (by C-G formula based on SCr of 1.6 mg/dL (H)).    Allergies  Allergen Reactions  . Codeine Anaphylaxis, Hives and Nausea And Vomiting  . Gabapentin Swelling and Other (See Comments)    Hallucinations, severe facial swelling   . Other Other (See Comments)    Will not accept blood or blood products - patient is Jehovah's Witness  . Ciprofloxacin Hives and Nausea And Vomiting  . Latex Hives and Rash  . Penicillins Hives    Has patient had a PCN reaction causing immediate rash, facial/tongue/throat swelling, SOB or lightheadedness with hypotension: Yes Has patient had a PCN reaction causing severe rash involving mucus membranes or skin necrosis: Yes Has patient had a PCN reaction that required hospitalization Yes Has patient had a PCN reaction occurring within the last 10 years: Yes If all of the above answers are "NO", then may proceed with Cephalosporin use.  . Sulfonamide Derivatives Hives and Rash   Antimicrobials this admission:  Tamiflu 1/3 >> (1/8) Doxy 1/4 x 1 Clinda 1/4 >> 1/5; restart 1/6> 1/9 Ceftin 1/5 >> 1/6  Meropenem 1/10 >>  Vancomycin 1/10 >>   Microbiology results:  1/2 UCx >> insignificant growth 1/3 Influenza: positive   1/6 BCx: ngtd 1/8 MRSA PCR: negative    Vincenza Hews, PharmD, BCPS 02/26/2016, 9:27 AM Pager: [phone]

## 2016-02-26 NOTE — Care Management Note (Addendum)
Case Management Note  Patient Details  Name: Victoria Fisher MRN: [id number] Date of Birth: [birth year]  Subjective/Objective:   Patient bipap dependent, had increased wob, hypoxia, and ams  transferred to ICU . Plan is for  SNF at dc.                Action/Plan:   Expected Discharge Date:  02/22/16               Expected Discharge Plan:  [place] (Independent Living)  In-House Referral:  Clinical Social Work  Discharge planning Services  CM Consult  Post Acute Care Choice:  NA Choice offered to:  NA  DME Arranged:  N/A DME Agency:  NA  HH Arranged:    [employer] Agency:     Status of Service:  In process, will continue to follow  If discussed at Long Length of Stay Meetings, dates discussed:    Additional Comments:  Zenon Mayo, RN 02/26/2016, 12:07 PM

## 2016-02-26 NOTE — Consult Note (Signed)
STAFF NOTE: I, Dr Ann Lions have personally reviewed patient's available data, including medical history, events of note, physical examination and test results as part of my evaluation. I have discussed with resident/NP and other care providers such as pharmacist, RN and RRT.  In addition,  I personally evaluated patient and elicited key findings of   S: ad.mitted 02/18/2016 with Flu A positive 02/19/16. On 03/06/16  -MRSA PCR psotive. On 02/26/16 - Dr Lavella Lemons reported aspiration and worsening respiratory distress and pccm consulted. 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  Past Surgical History:  Procedure Laterality Date  . ABDOMINAL HYSTERECTOMY  1983  . APPENDECTOMY  1956  . CARDIAC CATHETERIZATION  02/12/03   Showed a tiny old occlusion of the optional diagonal branch & also proximal LAD.  [name] CHOLECYSTECTOMY  1984  . COLON BIOPSY    . COLON RESECTION     2 times  . COLONOSCOPY  07/23/10     O: moaning groaning Floppy head to side On bipap Mildly paradoxical and mildly tachypneic   Recent Labs Lab 02/25/16 0620 02/25/16 1015 02/26/16 0408 02/26/16 0830  PHART 7.281* 7.413 7.324*  7.347*  PCO2ART 84.5* 59.3* 75.6* 69.6*  PO2ART 63.4* 52.1* 167* 80.3*  HCO3 38.6* 37.1* 38.2* 37.2*  O2SAT 90.1 88.2 99.1 94.7    Recent Labs Lab 02/20/16 0649 02/21/16 0614 02/25/16 0525  NA 136 139 139  K 4.2 3.6 4.2  CL 97* 97* 94*  CO2 28 32 36*  GLUCOSE 102* 97 101*  BUN 83* 81* 76*  CREATININE 1.58* 1.56* 1.60*  CALCIUM 9.0 9.2 9.7    Recent Labs Lab 02/20/16 0649 02/21/16 0614 02/25/16 0525  NA 136 139 139  K 4.2 3.6 4.2  CL 97* 97* 94*  CO2 28 32 36*  GLUCOSE 102* 97 101*  BUN 83* 81* 76*  CREATININE 1.58* 1.56* 1.60*  CALCIUM 9.0 9.2 9.7     Recent Labs Lab 02/19/16 1226 02/19/16 1840  TROPONINI 0.04* 0.04*   No results for input(s): LATICACIDVEN, PROCALCITON in the last 168 hours.  Dg Chest Port 1 View  Result Date: 02/26/2016 CLINICAL DATA:  Shortness of breath and respiratory distress EXAM: PORTABLE CHEST 1 VIEW COMPARISON:  Chest radiograph 02/25/2016 FINDINGS: Cardiomegaly can't aortic atherosclerosis are unchanged. Interstitial opacities in the right lung have worsened. Suspected lingular atelectasis has increased. No pneumothorax or sizable pleural effusion. IMPRESSION: Massive cardiomegaly, aortic atherosclerosis and slightly worsening pulmonary edema. Electronically Signed   By: Ulyses Jarred M.D.   On: 02/26/2016 03:30    A: Acute respiratory failure - following aspiration after admit for inflyuenxia  P: continue bipap but move to ICU for closer monitoring and intubation risk  Anti-infectives    Start     Dose/Rate Route Frequency Ordered Stop   02/27/16 1000  vancomycin (VANCOCIN) IVPB 750 mg/150 ml premix     750  mg 150 mL/hr over 60 Minutes Intravenous Every 24 hours 02/26/16 0923 03/04/16 0959   02/26/16 1000  meropenem (MERREM) 1 g in sodium chloride 0.9 % 100 mL IVPB     1 g 200 mL/hr over 30 Minutes Intravenous Every 12 hours 02/26/16 0923     02/26/16 0930  vancomycin (VANCOCIN) IVPB 1000 mg/200 mL premix     1,000 mg 200  mL/hr over 60 Minutes Intravenous  Once 02/26/16 0923     02/25/16 1000  clindamycin (CLEOCIN) IVPB 600 mg  Status:  Discontinued     600 mg 100 mL/hr over 30 Minutes Intravenous Every 8 hours 02/25/16 0845 02/26/16 0913   02/24/16 1400  clindamycin (CLEOCIN) capsule 300 mg  Status:  Discontinued     300 mg Oral Every 8 hours 02/24/16 1320 02/25/16 0845   02/22/16 1600  clindamycin (CLEOCIN) IVPB 300 mg  Status:  Discontinued     300 mg 100 mL/hr over 30 Minutes Intravenous Every 8 hours 02/22/16 1510 02/22/16 1520   02/22/16 1600  clindamycin (CLEOCIN) IVPB 600 mg  Status:  Discontinued     600 mg 100 mL/hr over 30 Minutes Intravenous Every 8 hours 02/22/16 1520 02/24/16 1320   02/21/16 2200  cefUROXime (CEFTIN) tablet 500 mg  Status:  Discontinued     500 mg Oral 2 times daily with meals 02/21/16 1602 02/22/16 1500   02/21/16 1500  clindamycin (CLEOCIN) capsule 300 mg  Status:  Discontinued     300 mg Oral Every 8 hours 02/21/16 1447 02/21/16 1602   02/20/16 1600  clindamycin (CLEOCIN) IVPB 600 mg  Status:  Discontinued     600 mg 100 mL/hr over 30 Minutes Intravenous Every 8 hours 02/20/16 1514 02/21/16 1447   02/19/16 2300  oseltamivir (TAMIFLU) capsule 30 mg    Comments:  Pharmacy. If GFR abnormal for 75mg , may change to 30mg . Thanks.   30 mg Oral Daily 02/19/16 2244 02/23/16 1100   02/19/16 0800  oseltamivir (TAMIFLU) capsule 30 mg     30 mg Oral  Once 02/19/16 0749 02/19/16 0826   02/19/16 0600  doxycycline (VIBRAMYCIN) 100 mg in dextrose 5 % 250 mL IVPB  Status:  Discontinued     100 mg 125 mL/hr over 120 Minutes Intravenous Every 12 hours 02/19/16 0517 02/20/16 1437        .  Rest per NP/medical resident whose note is outlined above and that I agree with  The patient is critically ill with multiple organ systems failure and requires high complexity decision making for assessment and support, frequent evaluation and titration of therapies, application of advanced monitoring  technologies and extensive interpretation of multiple databases.   Critical Care Time devoted to patient care services described in this note is  30  Minutes. This time reflects time of care of this signee Dr Brand Males. This critical care time does not reflect procedure time, or teaching time or supervisory time of PA/NP/Med student/Med Resident etc but could involve care discussion time    Dr. Brand Males, M.D., [facility].C.P Pulmonary and Critical Care Medicine Staff Physician [facility] Pager: [phone], If no answer or between  15:00h - 7:00h: call [phone]  02/26/2016 9:24 AM

## 2016-02-26 NOTE — Progress Notes (Signed)
Patient noted sounding as if she was gurgling. Entered the patient's room and she was coughing trying to clear lungs. Attempted to suction and no sputum expelled. Patient O2 sats dropped from 90% to 77% and sustained. Lungs sounds noted with expiratory wheezing and some crackles. Respiratory called and neb treatment given. Provider on call notified and new orders for Lasix, Bipap PRN, and Stat CXR. Respiratory therapist placed patient on a nonrebreather. O2 Sats now 96%. X ray technician present to do chest xray. Patient resting comfortably. Will continue to monitor.

## 2016-02-26 NOTE — Progress Notes (Signed)
RT called to patient beside for increased work of breathing and decreased oxygen saturation. RT administered PRN albuterol and placed patient on non-rebreather after treatment. Patient work of breathing and oxygen saturation better at this time. PRN order for BiPAP in place and RT will begin treatment if needed. RT will continue to monitor.

## 2016-02-26 NOTE — Consult Note (Signed)
PULMONARY / CRITICAL CARE MEDICINE   Name: Victoria Fisher MRN: [id number] DOB: Apr 02, 1934    ADMISSION DATE:  02/18/2016 CONSULTATION DATE:  1/10  REFERRING MD:  Hongalgi   CHIEF COMPLAINT:  Respiratory failure, aspiration   HISTORY OF PRESENT ILLNESS:   82yo female SNF resident with multiple medical problems including chronic dCHF, COPD, CAD, CKD, OSA, pulmonary HTN, AFib (not on anticoagulation r/t frequent falls), presented 1/2 with SOB and cough.  Work up revealed Flu A POS, RUL infiltrate thought r/t aspiration.  She was admitted by [facility] with acute hypercarbic respiratory failure requiring bipap and treated with tamiflu, doxycycline (now changed to clindamycin).  On 1/9-1/10 had increased WOB, hypoxia and AMS and PCCM consulted to assist.     Currently has intermittent confusion. Oriented to person and place. Mental status slightly improved.  Tachypneic on bipap but denies SOB.      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  PAST SURGICAL HISTORY: She  has a past surgical history that includes Appendectomy (1956); Abdominal hysterectomy (1983); Colon resection; Cholecystectomy (1984); Colon biopsy; Cardiac catheterization (02/12/03); and Colonoscopy (07/23/10).  Allergies   Allergen Reactions  . Codeine Anaphylaxis, Hives and Nausea And Vomiting  . Gabapentin Swelling and Other (See Comments)    Hallucinations, severe facial swelling   . Other Other (See Comments)    Will not accept blood or blood products - patient is Jehovah's Witness  . Ciprofloxacin Hives and Nausea And Vomiting  . Latex Hives and Rash  . Penicillins Hives    Has patient had a PCN reaction causing immediate rash, facial/tongue/throat swelling, SOB or lightheadedness with hypotension: Yes Has patient had a PCN reaction causing severe rash involving mucus membranes or skin necrosis: Yes Has patient had a PCN reaction that required hospitalization Yes Has patient had a PCN reaction occurring within the last 10 years: Yes If all of the above answers are "NO", then may proceed with Cephalosporin use.  . Sulfonamide Derivatives Hives and Rash    No current facility-administered medications on file prior to encounter.    Current Outpatient Prescriptions on File Prior to Encounter  Medication Sig  . aspirin EC 81 MG tablet Take 81 mg by mouth daily.   [name] atorvastatin (LIPITOR) 40 MG tablet Take 40 mg by mouth daily after supper.   . calcitRIOL (ROCALTROL) 0.25 MCG capsule Take 1 capsule (0.25 mcg total) by mouth daily.  . Cholecalciferol (VITAMIN D) 2000 units CAPS Take 2,000 Units by mouth daily.  . Darbepoetin Alfa (ARANESP) 100 MCG/0.5ML SOSY injection Inject 0.5 mLs (100 mcg total) into the skin every Friday at 6 PM.  . ferrous sulfate 325 (65 FE) MG tablet Take 325 mg by mouth daily after supper.   . levalbuterol (XOPENEX HFA) 45 MCG/ACT inhaler Inhale 1 puff into the lungs every 4 (four) hours as needed for wheezing or shortness of breath.   . levothyroxine (SYNTHROID, LEVOTHROID) 112 MCG tablet Take 1 tablet (112 mcg total)  by mouth every morning.  . magnesium oxide (MAG-OX) 400 (241.3 MG) MG tablet Take 1 tablet (400 mg total) by mouth daily.  . metolazone (ZAROXOLYN) 5 MG tablet  Take 5 mg (1 tablet) once daily on Monday Wednesday and Friday only  . metoprolol succinate (TOPROL-XL) 50 MG 24 hr tablet Take 50 mg by mouth daily. Take with or immediately following a meal.  . morphine (MS CONTIN) 30 MG 12 hr tablet Take 1 tablet (30 mg total) by mouth 2 (two) times daily.  [name] morphine (MSIR) 15 MG tablet Take 1 tablet (15 mg total) by mouth 3 (three) times daily as needed (breakthrough pain).  . nitroGLYCERIN (NITROSTAT) 0.4 MG SL tablet Place 1 tablet (0.4 mg total) under the tongue every 5 (five) minutes as needed for chest pain.  . polyethylene glycol (MIRALAX / GLYCOLAX) packet Take 17 g by mouth 2 (two) times daily as needed (constipation). Mix in 8 oz liquid and drink  . potassium chloride SA (K-DUR,KLOR-CON) 20 MEQ tablet TAKE 3 TABLETS BY MOUTH TWICE DAILY. TAKE 1 TABLET EXTRA ON MONDAY, WEDNESDAY, AND FRIDAYS WITH METOLAZONE.  [name] senna (SENOKOT) 8.6 MG TABS tablet Take 2-3 tablets by mouth daily as needed for mild constipation.   . sertraline (ZOLOFT) 100 MG tablet Take 100 mg by mouth daily.  [name] torsemide (DEMADEX) 100 MG tablet Take 1 tablet (100 mg total) by mouth 2 (two) times daily.  . vitamin E 400 UNIT capsule Take 400 Units by mouth daily.    FAMILY HISTORY:  Her indicated that her mother is deceased. She indicated that her father is deceased. She indicated that three of her five brothers are alive.    SOCIAL HISTORY: She  reports that she has never smoked. She has never used smokeless tobacco. She reports that she does not drink alcohol or use drugs.  REVIEW OF SYSTEMS:   As per HPI - All other systems reviewed and were neg.    SUBJECTIVE:    VITAL SIGNS: BP 119/78 (BP Location: Left Arm)   Pulse 90   Temp 97.4 F (36.3 C) (Axillary)   Resp (!) 22   Ht 5\' 4"  (1.626 m)   Wt 68.7 kg (151 lb 7.3 oz)   SpO2 97%   BMI 26.00 kg/m   HEMODYNAMICS:    VENTILATOR SETTINGS: FiO2 (%):  [31 %-100 %] 60 %  INTAKE / OUTPUT: No intake/output data  recorded.  PHYSICAL EXAMINATION: General:  Frail, anxious, chronically ill appearing female, NAD  Neuro:  Awake, confused intermittently, oriented x2 HEENT:  Mm dry, bipap, L scalp/facial bruising s/p fall at home   Cardiovascular:  s1s2 irreg  Lungs:  resps even, mildly tachypneic on bipap, paradoxical at times, diminished, few bibasilar crackles  Abdomen:  Round, soft, non tender  Musculoskeletal:  Warm and dry, chronic BLE edema   LABS:  BMET  Recent Labs Lab 02/20/16 0649 02/21/16 0614 02/25/16 0525  NA 136 139 139  K 4.2 3.6 4.2  CL 97* 97* 94*  CO2 28 32 36*  BUN 83* 81* 76*  CREATININE 1.58* 1.56* 1.60*  GLUCOSE 102* 97 101*    Electrolytes  Recent Labs Lab 02/20/16 0649 02/21/16 0614 02/25/16 0525  CALCIUM 9.0 9.2 9.7    CBC  Recent Labs Lab 02/22/16 1540 02/23/16 0710 02/24/16 0440  WBC 4.5 5.3 5.3  HGB 11.9* 11.9* 12.1  HCT 38.3 39.5 41.0  PLT 74* 76* 81*    Coag's No results for input(s): APTT, INR in  the last 168 hours.  Sepsis Markers No results for input(s): LATICACIDVEN, PROCALCITON, O2SATVEN in the last 168 hours.  ABG  Recent Labs Lab 02/25/16 1015 02/26/16 0408 02/26/16 0830  PHART 7.413 7.324* 7.347*  PCO2ART 59.3* 75.6* 69.6*  PO2ART 52.1* 167* 80.3*    Liver Enzymes No results for input(s): AST, ALT, ALKPHOS, BILITOT, ALBUMIN in the last 168 hours.  Cardiac Enzymes  Recent Labs Lab 02/19/16 1226 02/19/16 1840  TROPONINI 0.04* 0.04*    Glucose No results for input(s): GLUCAP in the last 168 hours.  Imaging Dg Chest Port 1 View  Result Date: 02/26/2016 CLINICAL DATA:  Shortness of breath and respiratory distress EXAM: PORTABLE CHEST 1 VIEW COMPARISON:  Chest radiograph 02/25/2016 FINDINGS: Cardiomegaly can't aortic atherosclerosis are unchanged. Interstitial opacities in the right lung have worsened. Suspected lingular atelectasis has increased. No pneumothorax or sizable pleural effusion. IMPRESSION: Massive  cardiomegaly, aortic atherosclerosis and slightly worsening pulmonary edema. Electronically Signed   By: Ulyses Jarred M.D.   On: 02/26/2016 03:30   Dg Swallowing Func-speech Pathology  Result Date: 02/25/2016 Objective Swallowing Evaluation: Type of Study: MBS-Modified Barium Swallow Study Patient Details Name: QUINCEE AREOLA MRN: [id number] Date of Birth: 20-Feb-1934 Today's Date: 02/25/2016 Time: SLP Start Time (ACUTE ONLY): 1350-SLP Stop Time (ACUTE ONLY): 1411 SLP Time Calculation (min) (ACUTE ONLY): 21 min Past Medical History: Past Medical History: Diagnosis Date . Abnormality of gait 01/30/2015 . Anemia in CKD (chronic kidney disease)  . Anxiety  . Arthritis  . Asthma   On nebulizer . Atrial fibrillation, chronic ([place])   a. Rate controlled w/ BB. No OAC 2/2 falls and gait instability. . Bowel obstruction   Caused by scar tissue . Chronic cor pulmonale ([facility])   Most likely due to COPD (cannot rule out contribution of diastolic LV dysfunction). ECHO 07/30/11 pulmonary artery pressure was estimated to be 50-60% mmHg & there was moderate tricuspid insufficiency. . Chronic diastolic CHF (congestive heart failure) ([place])   a. Predominantly right heart failure due to cor pulmonale & in turn this is most likely due to untreated OSA;  b. 05/2013 Echo: EF 55-60%, no rwma, mild to mod MR, sev dil LA, mod to sev dil RV w/ reduced systolic fxn, massively dil RA, mod-sev TR, PASP 10mmHg. [name] Chronic pain syndrome  . Colon cancer ([place])   Carcinoma of sigmoid colon 1984 & 1985. [name] COPD (chronic obstructive pulmonary disease) ([place])  . Coronary artery disease   a. occluded proximal diagonal artery on angiography 2004 . Depression  . Diverticulosis of colon  . 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 Dyspnea on exertion   Reluctant to take diuretics . Edema of both legs   Venous Doppler 05/11/12 was normal & no evidence of thrombus or thrombophlebitis. Chronic LE edema related to both cor pulmonale & lymphedema, hyperlipidemia & HTN  involving coronary atherosclerosis by cardiac cath in 2004. Cath showed a tiny old occlusion of the optional diagonal branch & also proximal LAD. [name] Frequent falls   Possibly due to orthostatic hypotension but we have not seen that on our visits. Recent fall 05/2012 with scalp hematoma, swollen left LE, & black eyes. . Hyperlipidemia   On a statin. Catheterization 02/12/03 Showed a tiny old occlusion of the optional diagonal branch & also proximal LAD. [name] Hypertension  . Hypothyroidism 2002  On hormone replacement therapy . IBS (irritable bowel syndrome)  . Insomnia  . Kidney failure 09/05/2015  Pt states that she went to the doctor on thursday and was told  she is in stage 4 kidney failure . Lung nodule  . Milroy's disease   Lymphatic disorder diagnosed at [facility] . Obstructive sleep apnea   Polysomnogram 09/25/11 AHI: 53.6/hr overall & 57.1/hr during REM sleep. Poor compliance previously with CPAP . Pulmonary hypertension   Pullmonary hypertension, chronic cor pulmonale w/pulmonary artery pressures of 50&#x2011; 60 mmHg. . Tuberculosis  Past Surgical History: Past Surgical History: Procedure Laterality Date . ABDOMINAL HYSTERECTOMY  1983 . APPENDECTOMY  1956 . CARDIAC CATHETERIZATION  02/12/03  Showed a tiny old occlusion of the optional diagonal branch & also proximal LAD. [name] CHOLECYSTECTOMY  1984 . COLON BIOPSY   . COLON RESECTION    2 times . COLONOSCOPY  07/23/10 HPI: Kent B Burnettis a 82 y.o.femalewith history of diastolic CHF, COPD, OSA, colon cancer,hypertension, chronic lymphedema CAD chronic kidney disease A. fib presents to the ER because of shortness of breath and productive cough and frequent falls. Initial ER physician note the patient was confused for which CT head was negative for acute intracranial process. Per note pt admitted for bronchitis and lower extremity cellulitis. CXR Continued improvement in left lower lobe airspace disease. MD ordered ST eval due to desat to 70's on 02 and baseline cough  (cxr ordered 1/7). No Data Recorded Assessment / Plan / Recommendation CHL IP CLINICAL IMPRESSIONS 02/25/2016 Therapy Diagnosis Suspected primary esophageal dysphagia;Mild pharyngeal phase dysphagia Clinical Impression Pt exhibited a suspected primary esophageal dysphagia marked byf esophageal column full of barium after 3 trials thin and puree. Esophageal residue reached the level of the patients cervical esophagus with questionable entrace to pyriform sinuses. Suspect pt will or has been penetrating/aspirating post prandial especially due to pt's suboptimal trunk and head positioning. Decreased pharyngeal sensation due to swallow initiation at the level of the pyriforms; mild vallecular residue. Pt will likely aspirate all consistencies. SLP spoke with MD. If po's are continued, thin liquid diet would be recommended over solids or purees due to esophageal residue. ST will follow up next date for decision re: food/liquid. Pt may be a candidate for Palliative care given results of MBS.  Impact on safety and function Severe aspiration risk Some recent data might be hidden   CHL IP TREATMENT RECOMMENDATION 02/25/2016 Treatment Recommendations Therapy as outlined in treatment plan below Some recent data might be hidden   Prognosis 02/25/2016 Prognosis for Safe Diet Advancement Fair Barriers to Reach Goals -- Barriers/Prognosis Comment -- Some recent data might be hidden CHL IP DIET RECOMMENDATION 02/25/2016 SLP Diet Recommendations NPO Liquid Administration via -- Medication Administration -- Compensations -- Postural Changes -- Some recent data might be hidden   No flowsheet data found.  CHL IP FOLLOW UP RECOMMENDATIONS 02/25/2016 Follow up Recommendations None Some recent data might be hidden   CHL IP FREQUENCY AND DURATION 02/25/2016 Speech Therapy Frequency (ACUTE ONLY) min 1 x/week Treatment Duration 1 week Some recent data might be hidden      CHL IP ORAL PHASE 02/25/2016 Oral Phase WFL Oral - Pudding Teaspoon -- Oral -  Pudding Cup -- Oral - Honey Teaspoon -- Oral - Honey Cup -- Oral - Nectar Teaspoon -- Oral - Nectar Cup -- Oral - Nectar Straw -- Oral - Thin Teaspoon -- Oral - Thin Cup -- Oral - Thin Straw -- Oral - Puree -- Oral - Mech Soft -- Oral - Regular -- Oral - Multi-Consistency -- Oral - Pill -- Oral Phase - Comment -- Some recent data might be hidden  CHL IP PHARYNGEAL PHASE 02/25/2016 Pharyngeal Phase Impaired  Pharyngeal- Pudding Teaspoon -- Pharyngeal -- Pharyngeal- Pudding Cup -- Pharyngeal -- Pharyngeal- Honey Teaspoon -- Pharyngeal -- Pharyngeal- Honey Cup -- Pharyngeal -- Pharyngeal- Nectar Teaspoon -- Pharyngeal -- Pharyngeal- Nectar Cup -- Pharyngeal -- Pharyngeal- Nectar Straw -- Pharyngeal -- Pharyngeal- Thin Teaspoon -- Pharyngeal -- Pharyngeal- Thin Cup -- Pharyngeal -- Pharyngeal- Thin Straw Delayed swallow initiation-pyriform sinuses;Pharyngeal residue - valleculae Pharyngeal -- Pharyngeal- Puree Delayed swallow initiation-vallecula Pharyngeal -- Pharyngeal- Mechanical Soft -- Pharyngeal -- Pharyngeal- Regular -- Pharyngeal -- Pharyngeal- Multi-consistency -- Pharyngeal -- Pharyngeal- Pill -- Pharyngeal -- Pharyngeal Comment -- Some recent data might be hidden  CHL IP CERVICAL ESOPHAGEAL PHASE 02/25/2016 Cervical Esophageal Phase WFL Pudding Teaspoon -- Pudding Cup -- Honey Teaspoon -- Honey Cup -- Nectar Teaspoon -- Nectar Cup -- Nectar Straw -- Thin Teaspoon -- Thin Cup -- Thin Straw -- Puree -- Mechanical Soft -- Regular -- Multi-consistency -- Pill -- Cervical Esophageal Comment -- Some recent data might be hidden No flowsheet data found. [place] Siren 02/25/2016, 4:30 PM    Orbie Pyo Colvin Caroli.Ed CCC-SLP Pager [phone]             STUDIES:  1/9 MBS>> severe aspiration, should remain NPO   CULTURES: 1/3 flu PCR>>> Flu A POS 1/9 MRSA PCR>> POS  ANTIBIOTICS: Doxycycline 1/3>> 1/8 Clindamycin 1/8>>>  Vancomycin 1/10>>>  SIGNIFICANT EVENTS:   LINES/TUBES:   DISCUSSION: 82yo female  with multiple medical problems admitted 1/3 with acute respiratory failure r/t flu, aspiration PNA and probable acute on chronic CHF. Remains bipap dependent and PCCM consulted 1/10.   ASSESSMENT / PLAN:  PULMONARY Acute respiratory failure - multifactorial r/t aspiration PNA, flu, COPD, pulmonary edema  Flu A  Hx COPD  Aspiration PNA  Pulmonary edema  P:   tx ICU for close monitoring of tenuous respiratory status  Continue bipap  F/u CXR  F/u ABG if change in mental status  Hold all sedating medication  Diuresis as below  Broaden abx as below  q6 duoneb  No indication for systemic steroids at this time    CARDIOVASCULAR Acute on chronic diastolic CHF  AFib - not on anticoagulation  P:  Repeat lasix 40mg  IV x 1 now  Troponin x 1  HR control - continue low dose scheduled metoprolol  Lasix x 1 now  Hold home torsemide  Daily weights    RENAL CKD  P:   Monitor with diuresis  BMET now and in am   GASTROINTESTINAL Aspiration  P:   NPO  Will need ongoing goals of care discussions given severity of aspiration, likely needs PEG   HEMATOLOGIC Thrombocytopenia  P:  F/u CBC  SCD's   INFECTIOUS Aspiration PNA  Flu A - s/p 5 days tamiflu PCN allergic  P:   Will broaden for nosocomial coverage given likely ongoing aspiration and worsening resp status  Change clinda to vanc/merropenem for now  Narrow as able  Check pct   ENDOCRINE hypothyroid  P:   Continue IV synthroid   NEUROLOGIC AMS - multifactorial in setting respiratory failure/hypercarbia, metabolic encephalopathy and chronic narcotic use P:   Hold all sedation medications  bipap as above  tx ICU    FAMILY  - Updates:  No family available 1/10 - per nurse son has been alerted to ICU tx and may be arriving later today.  Need to discuss goals of care.   - Inter-disciplinary family meet or Palliative Care meeting due by: 1/17    Nickolas Madrid, NP 02/26/2016  9:34 AM  Pager: [phone] or  [phone]

## 2016-02-27 ENCOUNTER — Inpatient Hospital Stay (HOSPITAL_COMMUNITY): Payer: Medicare Other

## 2016-02-27 ENCOUNTER — Encounter: Payer: Self-pay | Admitting: Licensed Clinical Social Worker

## 2016-02-27 ENCOUNTER — Encounter: Payer: Self-pay | Admitting: *Deleted

## 2016-02-27 ENCOUNTER — Encounter (HOSPITAL_COMMUNITY): Payer: Self-pay | Admitting: Pulmonary Disease

## 2016-02-27 ENCOUNTER — Inpatient Hospital Stay (HOSPITAL_COMMUNITY)
Admission: RE | Admit: 2016-02-27 | Discharge: 2016-02-27 | Disposition: A | Discharge status: Home / Self Care | Payer: Self-pay | Source: Ambulatory Visit | Attending: Nephrology | Admitting: Nephrology

## 2016-02-27 ENCOUNTER — Ambulatory Visit: Payer: Self-pay | Admitting: Cardiovascular Disease

## 2016-02-27 ENCOUNTER — Other Ambulatory Visit: Payer: Self-pay | Admitting: Licensed Clinical Social Worker

## 2016-02-27 LAB — BASIC METABOLIC PANEL
Anion gap: 14 (ref 5–15)
BUN: 94 mg/dL — ABNORMAL HIGH (ref 6–20)
CALCIUM: 9.7 mg/dL (ref 8.9–10.3)
CHLORIDE: 93 mmol/L — AB (ref 101–111)
CO2: 33 mmol/L — ABNORMAL HIGH (ref 22–32)
CREATININE: 2.15 mg/dL — AB (ref 0.44–1.00)
GFR, EST AFRICAN AMERICAN: 24 mL/min — AB (ref >60)
GFR, EST NON AFRICAN AMERICAN: 21 mL/min — AB (ref >60)
Glucose, Bld: 97 mg/dL (ref 65–99)
Potassium: 4.1 mmol/L (ref 3.5–5.1)
Sodium: 140 mmol/L (ref 135–145)

## 2016-02-27 LAB — POCT I-STAT 3, ART BLOOD GAS (G3+)
Acid-Base Excess: 12 mmol/L — ABNORMAL HIGH (ref 0.0–2.0)
Bicarbonate: 37.8 mmol/L — ABNORMAL HIGH (ref 20.0–28.0)
O2 Saturation: 96 %
PO2 ART: 77 mmHg — AB (ref 83.0–108.0)
Patient temperature: 97.7
TCO2: 39 mmol/L (ref 0–100)
pCO2 arterial: 51.2 mmHg — ABNORMAL HIGH (ref 32.0–48.0)
pH, Arterial: 7.475 — ABNORMAL HIGH (ref 7.350–7.450)

## 2016-02-27 LAB — CBC
HEMATOCRIT: 43.3 % (ref 36.0–46.0)
Hemoglobin: 13.5 g/dL (ref 12.0–15.0)
MCH: 32.3 pg (ref 26.0–34.0)
MCHC: 31.2 g/dL (ref 30.0–36.0)
MCV: 103.6 fL — AB (ref 78.0–100.0)
Platelets: 108 10*3/uL — ABNORMAL LOW (ref 150–400)
RBC: 4.18 MIL/uL (ref 3.87–5.11)
RDW: 16.2 % — ABNORMAL HIGH (ref 11.5–15.5)
WBC: 9.2 10*3/uL (ref 4.0–10.5)

## 2016-02-27 LAB — CULTURE, BLOOD (ROUTINE X 2)
Culture: NO GROWTH
Culture: NO GROWTH

## 2016-02-27 LAB — PROCALCITONIN: Procalcitonin: 1.53 ng/mL

## 2016-02-27 MED ORDER — ORAL CARE MOUTH RINSE: 15.0000 mL | Freq: Two times a day (BID) | OROMUCOSAL | Status: DC

## 2016-02-27 MED ORDER — FENTANYL CITRATE (PF) 100 MCG/2ML IJ SOLN
25.0000 ug | INTRAMUSCULAR | Status: DC | PRN
  Administered 2016-02-27 – 2016-03-03 (×17): 25 ug via INTRAVENOUS
  Filled 2016-02-27 (×17): qty 2

## 2016-02-27 MED ORDER — ORAL CARE MOUTH RINSE
15.0000 mL | Freq: Two times a day (BID) | OROMUCOSAL | Status: DC
  Administered 2016-02-27 (×2): 15 mL via OROMUCOSAL

## 2016-02-27 MED ORDER — CHLORHEXIDINE GLUCONATE 0.12 % MT SOLN
15.0000 mL | Freq: Two times a day (BID) | OROMUCOSAL | Status: DC
  Administered 2016-02-27 – 2016-02-28 (×3): 15 mL via OROMUCOSAL
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
## 2016-02-27 NOTE — Progress Notes (Signed)
Patient currently on [facility] with sats of 96%. Patient is in no distress and all vitals are stable. BIPAP in room on standby but not needed at this time. Will continue to monitor.

## 2016-02-27 NOTE — Progress Notes (Signed)
PULMONARY / CRITICAL CARE MEDICINE   Name: Victoria Fisher MRN: [id number] DOB: 1935-01-26    ADMISSION DATE:  02/18/2016 CONSULTATION DATE:  1/10  REFERRING MD:  Hongalgi   CHIEF COMPLAINT:  Respiratory failure, aspiration   HISTORY OF PRESENT ILLNESS:   82yo female SNF resident with multiple medical problems including chronic dCHF, COPD, CAD, CKD, OSA, pulmonary HTN, AFib (not on anticoagulation r/t frequent falls), presented 1/2 with SOB and cough.  Work up revealed Flu A POS, RUL infiltrate thought r/t aspiration.  She was admitted by [facility] with acute hypercarbic respiratory failure requiring bipap and treated with tamiflu, doxycycline (now changed to clindamycin).  On 1/9-1/10 had increased WOB, hypoxia and AMS and PCCM consulted to assist.     Currently has intermittent confusion. Oriented to person and place. Mental status slightly improved.  Tachypneic on bipap but denies SOB.       SUBJECTIVE:  Intermittently confused. Was oriented x 2-3 for RN this morning. More somnolent presently.  VITAL SIGNS: BP (!) 141/87 (BP Location: Left Arm)   Pulse 90   Temp 97.2 F (36.2 C) (Axillary)   Resp 19   Ht 5\' 4"  (1.626 m)   Wt 151 lb 7.3 oz (68.7 kg)   SpO2 100%   BMI 26.00 kg/m   HEMODYNAMICS:    VENTILATOR SETTINGS: FiO2 (%):  [40 %] 40 %  INTAKE / OUTPUT: I/O last 3 completed shifts: In: 300 [IV Piggyback:300] Out: 600 [Urine:600]  PHYSICAL EXAMINATION: General:  Frail, chronically ill appearing female, NAD  Neuro:  Somnolent, confused intermittently, oriented x2 HEENT:  Mm dry, [place] in place, L scalp/facial bruising s/p fall at home   Cardiovascular:  s1s2 irreg  Lungs:  resps even, few crackles  Abdomen:  Round, soft, non tender  Musculoskeletal:  Warm and dry, chronic BLE edema   LABS:  BMET  Recent Labs Lab 02/25/16 0525 02/26/16 0831 02/27/16 0246  NA 139 140 140  K 4.2 4.1 4.1  CL 94* 94* 93*  CO2 36* 35* 33*  BUN 76* 87* 94*  CREATININE 1.60*  2.06* 2.15*  GLUCOSE 101* 127* 97    Electrolytes  Recent Labs Lab 02/25/16 0525 02/26/16 0831 02/27/16 0246  CALCIUM 9.7 9.6 9.7    CBC  Recent Labs Lab 02/24/16 0440 02/26/16 0831 02/27/16 0246  WBC 5.3 6.6 9.2  HGB 12.1 12.9 13.5  HCT 41.0 40.8 43.3  PLT 81* 92* 108*    Coag's No results for input(s): APTT, INR in the last 168 hours.  Sepsis Markers  Recent Labs Lab 02/26/16 0951 02/27/16 0246  PROCALCITON 0.19 1.53    ABG  Recent Labs Lab 02/25/16 1015 02/26/16 0408 02/26/16 0830  PHART 7.413 7.324* 7.347*  PCO2ART 59.3* 75.6* 69.6*  PO2ART 52.1* 167* 80.3*    Liver Enzymes No results for input(s): AST, ALT, ALKPHOS, BILITOT, ALBUMIN in the last 168 hours.  Cardiac Enzymes  Recent Labs Lab 02/26/16 0951  TROPONINI <0.03    Glucose No results for input(s): GLUCAP in the last 168 hours.  Imaging Dg Chest Portable 1 View  Result Date: 02/27/2016 CLINICAL DATA:  82 year old female with shortness of Breath. COPD, cor pulmonale. Initial encounter. EXAM: PORTABLE CHEST 1 VIEW COMPARISON:  02/26/2016 and earlier. FINDINGS: Portable AP semi upright view at 0423 hours. The patient remains rotated to the left. Stable cardiomegaly and mediastinal contours. Calcified aortic atherosclerosis. Interval decreased pulmonary vascularity. No acute edema. Regressed platelike perihilar opacity. No pneumothorax. No definite pleural effusion. No areas  of worsening ventilation. IMPRESSION: 1. Interval improved ventilation with regressed pulmonary vascular congestion and perihilar atelectasis. 2. Stable cardiomegaly.  Calcified aortic atherosclerosis. Electronically Signed   By: Genevie Ann M.D.   On: 02/27/2016 06:58     STUDIES:  1/9 MBS>> severe aspiration, should remain NPO   CULTURES: 1/3 flu PCR>>> Flu A POS 1/9 MRSA PCR>> POS  ANTIBIOTICS: Doxycycline 1/3>> 1/8 Clindamycin 1/8>>> 1/10 Vancomycin 1/10>>> Meropenem 1/10>>>  SIGNIFICANT EVENTS: 1/10>  transferred to ICU  LINES/TUBES:   DISCUSSION: 82yo female with multiple medical problems admitted 1/3 with acute respiratory failure r/t flu, aspiration PNA and probable acute on chronic CHF. Remains bipap dependent and PCCM consulted 1/10.   ASSESSMENT / PLAN:  PULMONARY Acute respiratory failure - multifactorial r/t aspiration PNA, flu, COPD, pulmonary edema  Flu A  Hx COPD  Aspiration PNA  Pulmonary edema  P:   Bipap PRN F/u ABG Hold all sedating medication  Diuresis as below  Abx as below  q6 duoneb  No indication for systemic steroids at this time    CARDIOVASCULAR Acute on chronic diastolic CHF  AFib - not on anticoagulation  P:  Troponin neg yesterday HR control - continue low dose scheduled metoprolol  Hold home torsemide  Daily weights  Monitor of diuretics today - cr increased  RENAL CKD  P:   Monitor with diuresis  BMET now and in am   GASTROINTESTINAL Aspiration  P:   NPO  Will need ongoing goals of care discussions given severity of aspiration  HEMATOLOGIC Thrombocytopenia  P:  F/u CBC  SCD's   INFECTIOUS Aspiration PNA  Flu A - s/p 5 days tamiflu PCN allergic  P:   Will broaden for nosocomial coverage given likely ongoing aspiration and worsening resp status  Cont vanc/merropenem for now  Narrow as able   ENDOCRINE hypothyroid  P:   Continue IV synthroid   NEUROLOGIC AMS - multifactorial in setting respiratory failure/hypercarbia, metabolic encephalopathy and chronic narcotic use P:   Hold all sedation medications    FAMILY  - Updates:  No family available 1/11  - Inter-disciplinary family meet or Palliative Care meeting due by: 1/17   Jacques Earthly, MD  Internal Medicine PGY-3 02/27/2016  11:30 AM Pager: [phone] or [phone]

## 2016-02-27 NOTE — Progress Notes (Signed)
eLink Physician-Brief Progress Note Patient Name: Victoria Fisher DOB: May 05, 1934 MRN: [id number]   Date of Service  02/27/2016  HPI/Events of Note  Patient c/o of leg pain. Takes MS Contin at home which is now held d/t [facility]. ACT = 54 and Creatinine = 2.15. Therefore, will avoid Tylenol and NSAIDs.  eICU Interventions  Will order: 1. Fentanyl 25 mcg IV Q 4 hours PRN pain.      Intervention Category Intermediate Interventions: Pain - evaluation and management  [name] 02/27/2016, 8:15 PM

## 2016-02-27 NOTE — Progress Notes (Signed)
Orthopedic Tech Progress Note Patient Details:  Victoria Fisher [birth year] [id number]  Ortho Devices Type of Ortho Device: Unna boot Ortho Device/Splint Location: applied Haematologist wrap/dress to Left and Right Legs. Nurse at bedside.  Ortho Device/Splint Interventions: Application   Kristopher Oppenheim 02/27/2016, 3:53 PM

## 2016-02-27 NOTE — Progress Notes (Signed)
She has been off of BiPAP since 8AM. ABG drawn this afternoon with pH 7.48, pCO2 51.2. O2 sat 93% on 3L [place]. Will transfer to SDU. Discussed patient with Dr. Maryland Pink. [facility] to resume care in AM.

## 2016-02-27 NOTE — Progress Notes (Signed)
Patient was transferred to ICU under CCM care on 02/26/16. She has been Bipap dependant since then. [facility] will sign off at this time. Please re consult [facility] through the flow managers for any further assistance.  Vernell Leep, MD, FACP, FHM. [facility] Pager [phone]  If 7PM-7AM, please contact night-coverage [email or web address] Password [facility] 02/27/2016, 8:39 AM

## 2016-02-27 NOTE — Addendum Note (Signed)
Addended by: Tobi Bastos on: 02/27/2016 09:22 AM   Modules accepted: Orders

## 2016-02-27 NOTE — Progress Notes (Unsigned)
This encounter was created in error - please disregard.

## 2016-02-27 NOTE — Patient Outreach (Addendum)
[facility]) Care Management  02/27/2016  Victoria Fisher 18-Mar-1934 [id number]   Assessment- CSW received new referral on patient on 02/27/16. Care providers have several concerns about patient's inability to take care of herself due to patient's history of medication confusion (taking medications that are not hers or expired), ongoing falls, hospitalizations and physical impairments. Patient and spouse live in [place]. [employer] expressed having concerns about couple's safety as well. Patient's spouse is currently at [facility]. Patient presented to ED on 02/19/16 due to bronchitis. She continued to decline and was transferred to ICU on 02/26/16. Plan is for patient to go to [facility] after she discharges from hospital.   [place] completed call to patient's son [name] who lives in [place], [place]. [place] verifications were received. CSW introduced self, reason for call and of [facility] social work assistance. He reports that his step father has dementia. He shares that his mother has a history of taking medications that she is not suppose to. He states that he is in agreement that family need a higher level of care. He reports that [facility] was involved to assist with medication adherence but that "they quit because she wasn't compliant." He reports that they hired a private caregiver for the family and that she recently quit as well because patient was not being compliant. CSW provided education on LTC placement and its process. He reports that he is interested in having family have a 24/7 caregiver or family to go to ALF. When CSW tried to provide advice on the necessary steps to assist with long term placement, son stated "The plan is already in the works and being handled. I have talked to a lot of social workers who have been helping me." Son states that he has been in contact with Ernest Pine from [place], inpatient social workers, social workers at  [employer] and another female Education officer, museum that he is not sure where he is employed at and they all have provided with the resources that he needs. He reports that his mother would be able to qualify for LTC Medicaid but that his step father will have to be private pay. He reports that if they decide to hire a full time caregiver then the family will need to move into a smaller apartment at [facility]. He states that his goal is to keep family together. CSW provided education on private pay personal care resources as well. Son Patent attorney of social work assistance and support. CSW informed son that she will be following patient when she goes to SNF.   CSW completed call to APS caseworker Ernest Pine at [phone] and left a HIPPA compliant voice message.  Plan-CSW will send involvement letter to PCP. CSW will follow case and await for SNF discharge.  [name], BSW, MSW, [email or web address] Phone: [phone] Fax: [phone]

## 2016-02-28 DIAGNOSIS — Z7189 Other specified counseling: Secondary | ICD-10-CM

## 2016-02-28 DIAGNOSIS — J690 Pneumonitis due to inhalation of food and vomit: Secondary | ICD-10-CM

## 2016-02-28 DIAGNOSIS — Z515 Encounter for palliative care: Secondary | ICD-10-CM

## 2016-02-28 LAB — CBC
HEMATOCRIT: 43.7 % (ref 36.0–46.0)
Hemoglobin: 14 g/dL (ref 12.0–15.0)
MCH: 32.4 pg (ref 26.0–34.0)
MCHC: 32 g/dL (ref 30.0–36.0)
MCV: 101.2 fL — AB (ref 78.0–100.0)
PLATELETS: 120 10*3/uL — AB (ref 150–400)
RBC: 4.32 MIL/uL (ref 3.87–5.11)
RDW: 16 % — ABNORMAL HIGH (ref 11.5–15.5)
WBC: 10.4 10*3/uL (ref 4.0–10.5)

## 2016-02-28 LAB — BASIC METABOLIC PANEL
ANION GAP: 19 — AB (ref 5–15)
BUN: 93 mg/dL — ABNORMAL HIGH (ref 6–20)
CHLORIDE: 98 mmol/L — AB (ref 101–111)
CO2: 28 mmol/L (ref 22–32)
Calcium: 9.7 mg/dL (ref 8.9–10.3)
Creatinine, Ser: 1.92 mg/dL — ABNORMAL HIGH (ref 0.44–1.00)
GFR calc Af Amer: 27 mL/min — ABNORMAL LOW (ref >60)
GFR, EST NON AFRICAN AMERICAN: 24 mL/min — AB (ref >60)
Glucose, Bld: 71 mg/dL (ref 65–99)
POTASSIUM: 4.5 mmol/L (ref 3.5–5.1)
SODIUM: 145 mmol/L (ref 135–145)

## 2016-02-28 LAB — PROCALCITONIN: Procalcitonin: 1.2 ng/mL

## 2016-02-28 MED ORDER — BISACODYL 10 MG RE SUPP: 10.0000 mg | Freq: Every day | RECTAL | Status: DC | PRN

## 2016-02-28 MED ORDER — BISACODYL 10 MG RE SUPP
10.0000 mg | Freq: Once | RECTAL | Status: AC
  Administered 2016-02-28: 10 mg via RECTAL
  Filled 2016-02-28: qty 1

## 2016-02-28 MED ORDER — ORAL CARE MOUTH RINSE
15.0000 mL | Freq: Two times a day (BID) | OROMUCOSAL | Status: DC
  Administered 2016-02-28 – 2016-03-06 (×13): 15 mL via OROMUCOSAL

## 2016-02-28 NOTE — Progress Notes (Signed)
This RN attempted to give patient suppository. Unable to pass suppository due to stool. RN disimpacted patient and then inserted suppository.

## 2016-02-28 NOTE — Progress Notes (Signed)
[facility] PROGRESS NOTE  Victoria Fisher [id number] DOB: 06-Aug-1934 DOA: 02/18/2016  PCP: Haywood Pao, MD  Brief History/Interval Summary: 82 year old female, SNF resident, PMH of chronic diastolic CHF, cor pulmonale, HTN, chronic lymphedema, CAD, chronic kidney disease, A. fib, chronic pain syndrome, COPD, frequent falls, hypothyroid and OSA presented to ED on 02/19/16 due to dyspnea and productive cough. She was initially admitted for acute bronchitis. She continued to decline from 02/25/16. CCM was consulted and patient was transferred to ICU under CCM care on 02/26/16. She was stabilized. Transferred back to the hospitalist service on 1/12. Noted to have oropharyngeal dysphagia with high aspiration risk.  Reason for Visit: Acute respiratory failure  Consultants: Pulmonology. Palliative medicine.  Procedures: None  Antibiotics:  Ceftin 1/5-1/6  Clindamycin 1/4-1/9   Doxycycline 1/2-1/3  Tamiflu-completed 5 day course  Meropenem and vancomycin 1/10 >  Subjective/Interval History: Patient feels well this morning. Still has shortness of breath, but not as bad as yesterday. Denies any chest pain. Requesting something to drink. She does admit to choking and coughing episodes with eating and drinking over the past several months. Denies any history of stroke.  ROS: Denies any nausea or vomiting  Objective:  Vital Signs  Vitals:   02/28/16 0806 02/28/16 0900 02/28/16 1119 02/28/16 1200  BP: (!) 150/91 (!) 142/86 (!) 150/88 139/90  Pulse: (!) 101 99 96 100  Resp: 20 (!) 21 20 (!) 21  Temp: 97.5 F (36.4 C)  97.5 F (36.4 C)   TempSrc: Oral  Oral   SpO2: 95% 98% (!) 89% 94%  Weight:      Height:        Intake/Output Summary (Last 24 hours) at 02/28/16 1230 Last data filed at 02/28/16 0600  Gross per 24 hour  Intake                0 ml  Output              675 ml  Net             -675 ml   Filed Weights   02/23/16 0500 02/25/16 0500 02/25/16 1516    Weight: 71.8 kg (158 lb 4.8 oz) 71.2 kg (157 lb) 68.7 kg (151 lb 7.3 oz)    General appearance: alert, cooperative, appears stated age, distracted and no distress Resp: Coarse breath sounds bilaterally with wheezing. Crackles at the bases. Cardio: regular rate and rhythm, S1, S2 normal, no murmur, click, rub or gallop GI: soft, non-tender; bowel sounds normal; no masses,  no organomegaly Extremities: extremities normal, atraumatic, no cyanosis or edema Neurologic: Awake and alert. Oriented to person, place. No facial asymmetry. Motor strength is equal bilateral upper and lower extremities.  Lab Results:  Data Reviewed: I have personally reviewed following labs and imaging studies  CBC:  Recent Labs Lab 02/23/16 0710 02/24/16 0440 02/26/16 0831 02/27/16 0246 02/28/16 0554  WBC 5.3 5.3 6.6 9.2 10.4  HGB 11.9* 12.1 12.9 13.5 14.0  HCT 39.5 41.0 40.8 43.3 43.7  MCV 107.3* 109.9* 103.8* 103.6* 101.2*  PLT 76* 81* 92* 108* 120*    Basic Metabolic Panel:  Recent Labs Lab 02/25/16 0525 02/26/16 0831 02/27/16 0246 02/28/16 0258  NA 139 140 140 145  K 4.2 4.1 4.1 4.5  CL 94* 94* 93* 98*  CO2 36* 35* 33* 28  GLUCOSE 101* 127* 97 71  BUN 76* 87* 94* 93*  CREATININE 1.60* 2.06* 2.15* 1.92*  CALCIUM 9.7 9.6 9.7 9.7  GFR: Estimated Creatinine Clearance: 22.6 mL/min (by C-G formula based on SCr of 1.92 mg/dL (H)).  Cardiac Enzymes:  Recent Labs Lab 02/26/16 0951  TROPONINI <0.03     Recent Results (from the past 240 hour(s))  Urine culture     Status: Abnormal   Collection Time: 02/18/16 11:58 PM  Result Value Ref Range Status   Specimen Description URINE, CATHETERIZED  Final   Special Requests NONE  Final   Culture <10,000 COLONIES/mL INSIGNIFICANT GROWTH (A)  Final   Report Status 02/20/2016 FINAL  Final  Culture, blood (Routine X 2) w Reflex to ID Panel     Status: None   Collection Time: 02/22/16  3:40 PM  Result Value Ref Range Status   Specimen  Description BLOOD LEFT ASSIST CONTROL  Final   Special Requests IN PEDIATRIC BOTTLE 3CC  Final   Culture NO GROWTH 5 DAYS  Final   Report Status 02/27/2016 FINAL  Final  Culture, blood (Routine X 2) w Reflex to ID Panel     Status: None   Collection Time: 02/22/16  3:45 PM  Result Value Ref Range Status   Specimen Description BLOOD RIGHT ASSIST CONTROL  Final   Special Requests IN PEDIATRIC BOTTLE 3CC  Final   Culture NO GROWTH 5 DAYS  Final   Report Status 02/27/2016 FINAL  Final  MRSA PCR Screening     Status: Abnormal   Collection Time: 02/25/16  3:42 PM  Result Value Ref Range Status   MRSA by PCR POSITIVE (A) NEGATIVE Final    Comment:        The GeneXpert MRSA Assay (FDA approved for NASAL specimens only), is one component of a comprehensive MRSA colonization surveillance program. It is not intended to diagnose MRSA infection nor to guide or monitor treatment for MRSA infections. RESULT CALLED TO, READ BACK BY AND VERIFIED WITHOfilia Neas RN [id number] 02/25/16 A BROWNING       Radiology Studies: Dg Chest Portable 1 View  Result Date: 02/27/2016 CLINICAL DATA:  82 year old female with shortness of Breath. COPD, cor pulmonale. Initial encounter. EXAM: PORTABLE CHEST 1 VIEW COMPARISON:  02/26/2016 and earlier. FINDINGS: Portable AP semi upright view at 0423 hours. The patient remains rotated to the left. Stable cardiomegaly and mediastinal contours. Calcified aortic atherosclerosis. Interval decreased pulmonary vascularity. No acute edema. Regressed platelike perihilar opacity. No pneumothorax. No definite pleural effusion. No areas of worsening ventilation. IMPRESSION: 1. Interval improved ventilation with regressed pulmonary vascular congestion and perihilar atelectasis. 2. Stable cardiomegaly.  Calcified aortic atherosclerosis. Electronically Signed   By: Genevie Ann M.D.   On: 02/27/2016 06:58     Medications:  Scheduled: . Chlorhexidine Gluconate Cloth  6 each Topical Q0600  .  ipratropium-albuterol  3 mL Nebulization Q6H  . levothyroxine  75 mcg Intravenous Daily  . mouth rinse  15 mL Mouth Rinse BID  . meropenem (MERREM) IV  1 g Intravenous Q12H  . metoprolol  2.5 mg Intravenous Q6H  . mupirocin ointment  1 application Nasal BID  . vancomycin  750 mg Intravenous Q24H   Continuous:  [id number], fentaNYL (SUBLIMAZE) injection, ipratropium-albuterol, naLOXone (NARCAN)  injection  Assessment/Plan:  Principal Problem:   Acute respiratory failure with hypoxia (HCC) Active Problems:   Essential hypertension   COPD (chronic obstructive pulmonary disease) (HCC)   Pulmonary hypertension   CKD (chronic kidney disease), stage III   Coronary artery disease   Chronic cor pulmonale (HCC)   Atrial fibrillation, chronic (HCC)   Obstructive sleep  apnea   Cellulitis of lower extremity   Acute bronchitis   Bronchitis   Pressure injury of skin    Acute hypoxic and hypercapnic respiratory failure Multifactorial due to Influenza with acute bronchitis, COPD, pulmonary edema and aspiration. On 1/9, patient was given a dose of Narcan with improvement in mental status. Noted increased work of breathing and hypoxia. BiPAP placed. CCM was consulted and patient was transferred to ICU on 02/26/16 for close monitoring. Antibiotics broadened, diuresis, bronchodilator nebulizations and hold sedating medications. Patient improved on BiPAP. She's been off of it for about 24 hours. She continues to wheeze. Some of this could be due to aspiration. Continue broad-spectrum antibiotics for now. De-escalate in the next 24-36 hours.  Oropharyngeal dysphagia with high aspiration risk Patient was seen by speech pathology. She underwent swallow evaluation which revealed high aspiration risk. Reason for her dysphagia is not entirely clear. She has not had any strokes in the past. Recent CT scan of the head did not show any acute findings. She does not have any focal neurological deficits at this  time. Discussed these issues with patient as well as her son. We will proceed with the palliative team consultation for now for further goals of care.  Acute on chronic diastolic CHF Patient was given intravenous Lasix during this hospitalization. She had worsening of her renal function so diuretics were held. Renal function is improving. Monitor urine output. Echocardiogram report from October 2017 reviewed. Systolic function was noted to be normal. Does have severe tricuspid regurgitation.  A. Fib Rate reasonably controlled. Not anticoagulation candidate due to recurrent falls. [place] Score: 4. Currently on scheduled IV metoprolol 2.5 MG every 6 hours.  Acute on Stage III chronic kidney disease Creatinine has worsened from 1.5-1.6 range to 2.06. Diuretics were held. Renal function is slowly improving. Monitor urine output.  Aspiration pneumonia Antibiotic management as below (broadened to vancomycin/meropenem from clindamycin). At high risk for aspiration at this time. Keep nothing by mouth.   Thrombocytopenia Counts have improved. No evidence for bleeding.  Influenza A Completed 5 days of Tamiflu on 1/7.  Hypothyroidism IV Synthroid while nothing by mouth.  Acute encephalopathy Multifactorial in the setting of acute respiratory failure, hypercapnia and metabolic encephalopathy. Chronic narcotic use. Responded to Narcan on 1/9. Hold sedating medications. Sertraline also discontinued for now as well as morphine. Mental status appears to be back to baseline.  Questionable Dementia Mental status appears to be at baseline.   Recurrent falls/recent fall Left scalp hematoma by CT. PT recommends SNF placement.  Chronic lymphedema/? [place] disease Continue Una boots  Chronic pain Management as indicated above.   Left leg wound per WOC  DVT Prophylaxis: SCDs as tolerated.    Code Status: Full code  Family Communication: Discussed with the patient and her son  Disposition Plan:  Continue management as outlined above. Await palliative medicine input.    LOS: 9 days   [place] [facility] Pager [phone] 02/28/2016, 12:30 PM  If 7PM-7AM, please contact night-coverage at [email or web address], password [facility]

## 2016-02-28 NOTE — Progress Notes (Signed)
Patient looks ok on "eyeball" rounds. PCCM will sign off. Call PCCM if needed   Dr. Brand Males, M.D., [facility].C.P Pulmonary and Critical Care Medicine Staff Physician [place] [facility] Pager: [phone], If no answer or between  15:00h - 7:00h: call [phone]  02/28/2016 9:48 AM

## 2016-02-28 NOTE — Progress Notes (Signed)
Speech Language Pathology Treatment:    Patient Details Name: Victoria Fisher MRN: [id number] DOB: 1934/06/23 Today's Date: 02/28/2016 Time: 1330-1350 SLP Time Calculation (min) (ACUTE ONLY): 20 min  Assessment / Plan / Recommendation Clinical Impression  Monita known to this SLP after MBS revealing suspected primary esophageal dysphagia with barium filling esophagus and reflux toward pharynx which may be source of repeated pna. Pt seen with puree and thin continuing to cough with suspected laryngeal penetration and aspiration. Pt unfortunately likely to aspirate despite consistency. Thin liquid may be less likely to remain in esophagus than solid (possibly less residue?). I don't think a feeding tube is in her best interest. If decision is comfort feeds, recommend ordering regular texture/thin liquids- that way the kitchen will allow pt to choose what she may be able to better tolerate (puddings/yogurt).     HPI   Victoria Fisher is a 82 y.o. female with history of diastolic CHF, COPD, OSA, colon cancer,hypertension, chronic lymphedema CAD chronic kidney disease A. fib presents to the ER because of shortness of breath and productive cough and frequent falls. Initial ER physician note the patient was confused for which CT head was negative for acute intracranial process. Per note pt admitted for bronchitis and lower extremity cellulitis. CXR Continued improvement in left lower lobe airspace disease. NPO recommended. MD asked ST to see again for po's.       SLP Plan  Continue with current plan of care     Recommendations  Diet recommendations:  (NPO except ice chips or sips water)                Oral Care Recommendations: Oral care QID Follow up Recommendations: Skilled Nursing facility Plan: Continue with current plan of care       [place], Aleathea Pugmire Willis 02/28/2016, 3:28 PM   Orbie Pyo Colvin Caroli.[employer] [phone]

## 2016-02-28 NOTE — Consult Note (Signed)
Consultation Note Date: 02/28/2016   Patient Name: Victoria Fisher  DOB: May 19, 1934  MRN: [id number]  Age / Sex: 82 y.o., female  PCP: Haywood Pao, MD Referring Physician: Bonnielee Haff, MD  Reason for Consultation: Establishing goals of care  HPI/Patient Profile: 82 y.o. female  with past medical history of dCHF, cor pulmonale, HTN, chronic lymphedema, CAD, chronic kidney disease, A. fib, chronic pain syndrome, COPD, frequent falls, hypothyroid and OSA who presented from her SNF with dyspnea and a productive cough, she was subsequently admitted on 02/18/2016 with acute bronchitis. Work-up returned with Flu A positive. Unfortunately, she continued to decline in respiratory status and mentation, requiring transition to ICU and BiPAP use. Acute respiratory failure attributed to a combination of flu, COPD, pulmonary edema, and aspiration pneumonia. MBS evaluation revealed a primary esophageal dysphagia, with speech reporting she will likely aspirate all consistencies   Clinical Assessment and Goals of Care: [name] is alert and interactive, however clearly still slightly confused. She was able to correctly answer all orientation questions, however her responses to other questions were tangential, rambling, and frequently deteriorated into perseveration "oh me, oh my, oh, oh." She was able to relate that she was here due to pneumonia, but was unclear on anything else that had transpired. When I brought up the swallowing issues that did seem to resonate with her, and she expressed that she was waiting for a "retrial to prove myself."  With her support, I called her son Victoria Fisher. He was able to provide more context to her clinical picture. He related that over the past few months she had become increasingly difficult at her assisted living facility. She was intermittently refusing physical therapy and involvement in activities, as well as declining some  medication. What medication she refused to take changed, and there was no clear reason for why she refused it. Mentally, she is normally fully alert and oriented and quite sharp.   Victoria Fisher also had a readily available copy of her living will, which she had prepared with a "geri-lawyer" prior to moving into assisted living. In that document she named Victoria Fisher as her HCPOA, and her nephew [name]) as the second person if he were unavailable. She also indicated that she would want to be Full code, however would not want to be maintained on live support (including vent, dialysis, etc.) for any prolonged period of time if she was not likely to recover or would end up in a vegetative state. To that end, Victoria Fisher understood that she would want to pursue aggressive and invasive interventions in order to prolong her life, however would not want to remain on life support indefinitely. He understands that if she is unable to communicate her wishes, he would be making the decision on whether to continue or stop these interventions.   Primary Decision Maker The patient is decisional when at her baseline mentation, however her present confusion makes me concerned that she does not have full capacity for medical decision making at this juncture. Her son, Victoria Fisher, is her designated Economist and has a copy of her Living Will.    SUMMARY OF RECOMMENDATIONS    Full code, full scope treatment  Pt and HCPOA are waiting on repeat swallow evaluation before determining if a feeding tube is necessary  Symptoms  Bowels: Dulcolax PR once, dulcolax PR PRN daily  Palliative will continue to follow to assist in decision making  Code Status/Advance Care Planning:  Full code  Palliative Prophylaxis:   Aspiration, Bowel Regimen,  Frequent Pain Assessment and Oral Care  Additional Recommendations (Limitations, Scope, Preferences):  Full Scope Treatment  Psycho-social/Spiritual:   Desire for further Chaplaincy  support:no  Additional Recommendations: TBD  Prognosis:   Unable to determine  Discharge Planning: To Be Determined      Primary Diagnoses: Present on Admission: . Obstructive sleep apnea . Essential hypertension . COPD (chronic obstructive pulmonary disease) ([place]) . Coronary artery disease . CKD (chronic kidney disease), stage III . Chronic cor pulmonale (HCC) . Atrial fibrillation, chronic ([place]) . Pulmonary hypertension . Acute respiratory failure with hypoxia ([place]) . Cellulitis of lower extremity . Acute bronchitis . Bronchitis   I have reviewed the medical record, interviewed the patient and family, and examined the patient. The following aspects are pertinent.  Past Medical History:  Diagnosis Date  . Abnormality of gait 01/30/2015  . Anemia in CKD (chronic kidney disease)   . Anxiety   . Arthritis   . Asthma    On nebulizer  . Atrial fibrillation, chronic ([place])    a. Rate controlled w/ BB. No OAC 2/2 falls and gait instability.  . Bowel obstruction    Caused by scar tissue  . Chronic cor pulmonale (HCC)    Most likely due to COPD (cannot rule out contribution of diastolic LV dysfunction). ECHO 07/30/11 pulmonary artery pressure was estimated to be 50-60% mmHg & there was moderate tricuspid insufficiency.  . Chronic diastolic CHF (congestive heart failure) ([place])    a. Predominantly right heart failure due to cor pulmonale & in turn this is most likely due to untreated OSA;  b. 05/2013 Echo: EF 55-60%, no rwma, mild to mod MR, sev dil LA, mod to sev dil RV w/ reduced systolic fxn, massively dil RA, mod-sev TR, PASP 42mmHg.  [name] Chronic pain syndrome   . Colon cancer ([place])    Carcinoma of sigmoid colon 1984 & 1985.  [name] COPD (chronic obstructive pulmonary disease) ([place])   . Coronary artery disease    a. occluded proximal diagonal artery on angiography 2004  . Depression   . Diverticulosis of colon   . 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 Dyspnea on exertion     Reluctant to take diuretics  . Edema of both legs    Venous Doppler 05/11/12 was normal & no evidence of thrombus or thrombophlebitis. Chronic LE edema related to both cor pulmonale & lymphedema, hyperlipidemia & HTN involving coronary atherosclerosis by cardiac cath in 2004. Cath showed a tiny old occlusion of the optional diagonal branch & also proximal LAD.  [name] Frequent falls    Possibly due to orthostatic hypotension but we have not seen that on our visits. Recent fall 05/2012 with scalp hematoma, swollen left LE, & black eyes.  . Hyperlipidemia    On a statin. Catheterization 02/12/03 Showed a tiny old occlusion of the optional diagonal branch & also proximal LAD.  [name] Hypertension   . Hypothyroidism 2002   On hormone replacement therapy  . IBS (irritable bowel syndrome)   . Insomnia   . Kidney failure 09/05/2015   Pt states that she went to the doctor on thursday and was told she is in stage 4 kidney failure  . Lung nodule   . Milroy's disease    Lymphatic disorder diagnosed at [facility]  . Obstructive sleep apnea    Polysomnogram 09/25/11 AHI: 53.6/hr overall & 57.1/hr during REM sleep. Poor compliance previously with CPAP  . Pulmonary hypertension    Pullmonary hypertension, chronic cor pulmonale w/pulmonary artery pressures of  50&#x2011; 60 mmHg.  [name] Refusal of blood transfusions as patient is Jehovah's Witness   . Tuberculosis    Social History   Social History  . Marital status: Married    Spouse name: Georgiann Mccoy  . Number of children: 3  . Years of education: HS   Occupational History  .  Retired   Social History Main Topics  . Smoking status: Never Smoker  . Smokeless tobacco: Never Used  . Alcohol use No  . Drug use: No  . Sexual activity: No   Other Topics Concern  . None   Social History Narrative   Patient is married Passenger transport manager) and lives at home with her husband.   Patient is retired.   Patient has three children and her husband has two children.   Patient is  right-handed.   Patient drinks very little caffeine (1/2 cup daily)   Patient has a high school education.   Family History  Problem Relation Age of Onset  . Heart disease Father   . Alzheimer's disease Mother   . Parkinsonism Brother   . Diabetes Brother    Scheduled Meds: . chlorhexidine  15 mL Mouth Rinse BID  . Chlorhexidine Gluconate Cloth  6 each Topical Q0600  . Darbepoetin Alfa  100 mcg Subcutaneous Q Fri-1800  . ipratropium-albuterol  3 mL Nebulization Q6H  . levothyroxine  75 mcg Intravenous Daily  . mouth rinse  15 mL Mouth Rinse q12n4p  . meropenem (MERREM) IV  1 g Intravenous Q12H  . metoprolol  2.5 mg Intravenous Q6H  . mupirocin ointment  1 application Nasal BID  . vancomycin  750 mg Intravenous Q24H   Continuous Infusions: PRN Meds:.albuterol, fentaNYL (SUBLIMAZE) injection, ipratropium-albuterol, naLOXone (NARCAN)  injection Allergies  Allergen Reactions  . Codeine Anaphylaxis, Hives and Nausea And Vomiting  . Gabapentin Swelling and Other (See Comments)    Hallucinations, severe facial swelling   . Other Other (See Comments)    Will not accept blood or blood products - patient is Jehovah's Witness  . Ciprofloxacin Hives and Nausea And Vomiting  . Latex Hives and Rash  . Penicillins Hives    Has patient had a PCN reaction causing immediate rash, facial/tongue/throat swelling, SOB or lightheadedness with hypotension: Yes Has patient had a PCN reaction causing severe rash involving mucus membranes or skin necrosis: Yes Has patient had a PCN reaction that required hospitalization Yes Has patient had a PCN reaction occurring within the last 10 years: Yes If all of the above answers are "NO", then may proceed with Cephalosporin use.  . Sulfonamide Derivatives Hives and Rash   Review of Systems  Constitutional: Positive for activity change and fatigue.  HENT: Positive for trouble swallowing. Negative for congestion, hearing loss, sneezing and sore throat.    Eyes: Negative for visual disturbance.  Respiratory: Positive for cough and shortness of breath. Negative for wheezing.   Cardiovascular: Positive for leg swelling (chronic). Negative for chest pain.  Gastrointestinal: Positive for constipation. Negative for abdominal pain, nausea and vomiting.  Musculoskeletal: Positive for back pain. Negative for neck pain and neck stiffness.       Chronic leg pain  Neurological: Positive for weakness. Negative for dizziness.  Psychiatric/Behavioral: Positive for confusion. Negative for sleep disturbance. The patient is nervous/anxious.    Physical Exam  Constitutional: She has a sickly appearance.  HENT:  Mouth/Throat: Mucous membranes are dry.  Eyes: EOM are normal.  Neck: Normal range of motion.  Cardiovascular: An irregularly irregular rhythm present. Tachycardia present.  Pulmonary/Chest: No accessory muscle usage. Tachypnea noted. She has no wheezes.  Dry hacking cough, non productive.  Abdominal: There is no tenderness.  Slightly firm  Musculoskeletal: Normal range of motion. She exhibits edema.  Neurological: She is alert.  Answers all orientation questions, however other responses are tangential, rambling, and she would intermittently perseverate "oh me, oh my, oh oh"  Skin: Skin is warm and dry. There is pallor.  Psychiatric: Thought content normal. Her affect is labile. Her speech is tangential. Cognition and memory are impaired. She expresses impulsivity.    Vital Signs: BP (!) 150/91 (BP Location: Left Arm)   Pulse (!) 101   Temp 97.5 F (36.4 C) (Oral)   Resp 20   Ht 5\' 4"  (1.626 m)   Wt 68.7 kg (151 lb 7.3 oz)   SpO2 95%   BMI 26.00 kg/m  Pain Assessment: No/denies pain POSS *See Group Information*: S-Acceptable,Sleep, easy to arouse Pain Score: 0-No pain   SpO2: SpO2: 95 % O2 Device:SpO2: 95 % O2 Flow Rate: .O2 Flow Rate (L/min): 2 L/min  IO: Intake/output summary:  Intake/Output Summary (Last 24 hours) at 02/28/16  0921 Last data filed at 02/28/16 0600  Gross per 24 hour  Intake              350 ml  Output              875 ml  Net             -525 ml    LBM: Last BM Date: 02/24/16 Baseline Weight: Weight: 74.8 kg (165 lb) Most recent weight: Weight: 68.7 kg (151 lb 7.3 oz)       Time Total: 70 minutes Greater than 50%  of this time was spent counseling and coordinating care related to the above assessment and plan.  Signed by: Charlynn Court, NP Palliative Medicine Team Pager # [phone] (M-F 7a-5p) Team Phone # [phone] (Nights/Weekends)

## 2016-02-29 DIAGNOSIS — J690 Pneumonitis due to inhalation of food and vomit: Secondary | ICD-10-CM

## 2016-02-29 DIAGNOSIS — G9340 Encephalopathy, unspecified: Secondary | ICD-10-CM

## 2016-02-29 LAB — BASIC METABOLIC PANEL
Anion gap: 15 (ref 5–15)
BUN: 87 mg/dL — AB (ref 6–20)
CALCIUM: 9.9 mg/dL (ref 8.9–10.3)
CO2: 31 mmol/L (ref 22–32)
Chloride: 98 mmol/L — ABNORMAL LOW (ref 101–111)
Creatinine, Ser: 1.65 mg/dL — ABNORMAL HIGH (ref 0.44–1.00)
GFR calc Af Amer: 33 mL/min — ABNORMAL LOW (ref >60)
GFR, EST NON AFRICAN AMERICAN: 28 mL/min — AB (ref >60)
GLUCOSE: 90 mg/dL (ref 65–99)
Potassium: 3.3 mmol/L — ABNORMAL LOW (ref 3.5–5.1)
Sodium: 144 mmol/L (ref 135–145)

## 2016-02-29 MED ORDER — SODIUM CHLORIDE 0.9 % IV SOLN
30.0000 meq | Freq: Once | INTRAVENOUS | Status: AC
  Administered 2016-02-29: 30 meq via INTRAVENOUS
  Filled 2016-02-29: qty 15

## 2016-02-29 MED ORDER — METHYLPREDNISOLONE SODIUM SUCC 125 MG IJ SOLR
60.0000 mg | Freq: Three times a day (TID) | INTRAMUSCULAR | Status: DC
  Administered 2016-02-29 – 2016-03-03 (×10): 60 mg via INTRAVENOUS
  Filled 2016-02-29 (×10): qty 2

## 2016-02-29 MED ORDER — POTASSIUM CHLORIDE CRYS ER 20 MEQ PO TBCR: 20.0000 meq | EXTENDED_RELEASE_TABLET | Freq: Once | ORAL | Status: DC

## 2016-02-29 MED ORDER — LORAZEPAM 2 MG/ML IJ SOLN
1.0000 mg | Freq: Once | INTRAMUSCULAR | Status: AC
  Administered 2016-02-29: 1 mg via INTRAVENOUS
  Filled 2016-02-29: qty 1

## 2016-02-29 NOTE — Progress Notes (Signed)
[facility] PROGRESS NOTE  Victoria Fisher [id number] DOB: 11/17/1934 DOA: 02/18/2016  PCP: Haywood Pao, MD  Brief History/Interval Summary: 82 year old female, SNF resident, PMH of chronic diastolic CHF, cor pulmonale, HTN, chronic lymphedema, CAD, chronic kidney disease, A. fib, chronic pain syndrome, COPD, frequent falls, hypothyroid and OSA presented to ED on 02/19/16 due to dyspnea and productive cough. She was initially admitted for acute bronchitis. She continued to decline from 02/25/16. CCM was consulted and patient was transferred to ICU under CCM care on 02/26/16. She was stabilized. Transferred back to the hospitalist service on 1/12. Noted to have oropharyngeal dysphagia with high aspiration risk.  Reason for Visit: Acute respiratory failure  Consultants: Pulmonology. Palliative medicine.  Procedures: None  Antibiotics:  Ceftin 1/5-1/6  Clindamycin 1/4-1/9   Doxycycline 1/2-1/3  Tamiflu-completed 5 day course  Meropenem and vancomycin 1/10 >  Subjective/Interval History: Patient noted to be confused this morning. Apparently, she was given a dose of Ativan last night for agitation. She is not responding as appropriately. However, noted to be moving all her extremities.   ROS: Denies any nausea or vomiting  Objective:  Vital Signs  Vitals:   02/29/16 0234 02/29/16 0342 02/29/16 0400 02/29/16 0742  BP:   (!) 147/106 (!) 164/102  Pulse:  (!) 101 (!) 101 (!) 101  Resp:  (!) 21 18 (!) 23  Temp:    97.5 F (36.4 C)  TempSrc:    Oral  SpO2: 90% 90% 92% (!) 89%  Weight:      Height:        Intake/Output Summary (Last 24 hours) at 02/29/16 0749 Last data filed at 02/28/16 2207  Gross per 24 hour  Intake              250 ml  Output              300 ml  Net              -50 ml   Filed Weights   02/23/16 0500 02/25/16 0500 02/25/16 1516  Weight: 71.8 kg (158 lb 4.8 oz) 71.2 kg (157 lb) 68.7 kg (151 lb 7.3 oz)    General appearance: Awake.  Moaning and groaning. Moving all her extremities.  Resp: Coarse breath sounds bilaterally with more wheezing today compared to yesterday. Crackles at the bases. Cardio: regular rate and rhythm, S1, S2 normal, no murmur, click, rub or gallop GI: soft, non-tender; bowel sounds normal; no masses,  no organomegaly Extremities: extremities normal, atraumatic, no cyanosis or edema Neurologic: Awake but agitated. Disoriented. No facial asymmetry. Motor strength is equal bilateral upper and lower extremities.  Lab Results:  Data Reviewed: I have personally reviewed following labs and imaging studies  CBC:  Recent Labs Lab 02/23/16 0710 02/24/16 0440 02/26/16 0831 02/27/16 0246 02/28/16 0554  WBC 5.3 5.3 6.6 9.2 10.4  HGB 11.9* 12.1 12.9 13.5 14.0  HCT 39.5 41.0 40.8 43.3 43.7  MCV 107.3* 109.9* 103.8* 103.6* 101.2*  PLT 76* 81* 92* 108* 120*    Basic Metabolic Panel:  Recent Labs Lab 02/25/16 0525 02/26/16 0831 02/27/16 0246 02/28/16 0258 02/29/16 0300  NA 139 140 140 145 144  K 4.2 4.1 4.1 4.5 3.3*  CL 94* 94* 93* 98* 98*  CO2 36* 35* 33* 28 31  GLUCOSE 101* 127* 97 71 90  BUN 76* 87* 94* 93* 87*  CREATININE 1.60* 2.06* 2.15* 1.92* 1.65*  CALCIUM 9.7 9.6 9.7 9.7 9.9    GFR: Estimated  Creatinine Clearance: 26.3 mL/min (by C-G formula based on SCr of 1.65 mg/dL (H)).  Cardiac Enzymes:  Recent Labs Lab 02/26/16 0951  TROPONINI <0.03     Recent Results (from the past 240 hour(s))  Culture, blood (Routine X 2) w Reflex to ID Panel     Status: None   Collection Time: 02/22/16  3:40 PM  Result Value Ref Range Status   Specimen Description BLOOD LEFT ASSIST CONTROL  Final   Special Requests IN PEDIATRIC BOTTLE 3CC  Final   Culture NO GROWTH 5 DAYS  Final   Report Status 02/27/2016 FINAL  Final  Culture, blood (Routine X 2) w Reflex to ID Panel     Status: None   Collection Time: 02/22/16  3:45 PM  Result Value Ref Range Status   Specimen Description BLOOD RIGHT  ASSIST CONTROL  Final   Special Requests IN PEDIATRIC BOTTLE 3CC  Final   Culture NO GROWTH 5 DAYS  Final   Report Status 02/27/2016 FINAL  Final  MRSA PCR Screening     Status: Abnormal   Collection Time: 02/25/16  3:42 PM  Result Value Ref Range Status   MRSA by PCR POSITIVE (A) NEGATIVE Final    Comment:        The GeneXpert MRSA Assay (FDA approved for NASAL specimens only), is one component of a comprehensive MRSA colonization surveillance program. It is not intended to diagnose MRSA infection nor to guide or monitor treatment for MRSA infections. RESULT CALLED TO, READ BACK BY AND VERIFIED WITHOfilia Neas RN [id number] 02/25/16 A BROWNING       Radiology Studies: No results found.   Medications:  Scheduled: . Chlorhexidine Gluconate Cloth  6 each Topical Q0600  . ipratropium-albuterol  3 mL Nebulization Q6H  . levothyroxine  75 mcg Intravenous Daily  . mouth rinse  15 mL Mouth Rinse BID  . meropenem (MERREM) IV  1 g Intravenous Q12H  . metoprolol  2.5 mg Intravenous Q6H  . mupirocin ointment  1 application Nasal BID  . vancomycin  750 mg Intravenous Q24H   Continuous:  [id number], bisacodyl, fentaNYL (SUBLIMAZE) injection, ipratropium-albuterol, naLOXone (NARCAN)  injection  Assessment/Plan:  Principal Problem:   Acute respiratory failure with hypoxia (HCC) Active Problems:   Essential hypertension   COPD (chronic obstructive pulmonary disease) (HCC)   Pulmonary hypertension   CKD (chronic kidney disease), stage III   Coronary artery disease   Chronic cor pulmonale (HCC)   Atrial fibrillation, chronic (HCC)   Obstructive sleep apnea   Cellulitis of lower extremity   Acute bronchitis   Bronchitis   Pressure injury of skin   Aspiration pneumonia (HCC)   Goals of care, counseling/discussion   Palliative care by specialist    Acute hypoxic and hypercapnic respiratory failure Multifactorial due to Influenza with acute bronchitis, COPD, pulmonary edema and  aspiration. On 1/9, patient was given a dose of Narcan with improvement in mental status. Noted increased work of breathing and hypoxia. BiPAP placed. CCM was consulted and patient was transferred to ICU on 02/26/16 for close monitoring. Antibiotics broadened, diuresis, bronchodilator nebulizations and hold sedating medications. Patient improved on BiPAP. She was taken off of it. Patient continues to have wheezing. She'll be given nebulizer treatment. Not noted to be on systemic steroids, which could help her, so we will initiate at this time. For now, continue with broad-spectrum antibiotics. Anticipate transitioning to Levaquin tomorrow.  Acute encephalopathy Patient noted to be encephalopathic this morning. This is most likely secondary  to the Ativan that she was given last night. She should not be given any strong sedative agents. Continue to monitor for now. Patient also has chronic narcotic use. She was given Narcan on 1/9 with improvement.   Oropharyngeal dysphagia with high aspiration risk Patient was seen by speech pathology. She underwent swallow evaluation which revealed high aspiration risk. Reason for her dysphagia is not entirely clear. She has not had any strokes in the past. Recent CT scan of the head did not show any acute findings. She does not have any focal neurological deficits at this time. Discussed these issues with patient as well as her son. Palliative medicine team is following. Seen again by speech therapist and again left nothing by mouth.  Acute on chronic diastolic CHF Patient was given intravenous Lasix during this hospitalization. She had worsening of her renal function so diuretics were held. Renal function is improving. Monitor urine output. Echocardiogram report from October 2017 reviewed. Systolic function was noted to be normal. Does have severe tricuspid regurgitation.  A. Fib Rate reasonably controlled. Not anticoagulation candidate due to recurrent falls. [place]  Score: 4. Currently on scheduled IV metoprolol 2.5 MG every 6 hours.  Acute on Stage III chronic kidney disease with hypokalemia Creatinine has worsened from 1.5-1.6 range to 2.06. Diuretics were held. Renal function is slowly improving. Monitor urine output. Replace K.  Aspiration pneumonia Antibiotic management as below (broadened to vancomycin/meropenem from clindamycin). At high risk for aspiration at this time. Keep nothing by mouth.   Thrombocytopenia Counts have improved. No evidence for bleeding.  Influenza A Completed 5 days of Tamiflu on 1/7.  Hypothyroidism IV Synthroid while nothing by mouth.  Questionable Dementia This history is not entirely clear.  Recurrent falls/recent fall Left scalp hematoma by CT. PT recommends SNF placement.  Chronic lymphedema/? Milroy disease Continue Una boots  Chronic pain Management as indicated above.   Left leg wound per WOC  DVT Prophylaxis: SCDs as tolerated.    Code Status: Full code  Family Communication: Discussed with the patient and her son  Disposition Plan: Continue management as outlined above. Avoid sedatives. Add steroids today.    LOS: 10 days   [place] [facility] Pager [phone] 02/29/2016, 7:49 AM  If 7PM-7AM, please contact night-coverage at [email or web address], password [facility]

## 2016-02-29 NOTE — Progress Notes (Signed)
Pt given ice chips and sips of water multiple times throughout the night. Mouth care performed and moistened, repositioned multiple times in bed. Continues to yell out "help" asking for water. Pt educated on the rationale of only being able to have ice chips and sips of water and expresses understanding but tries to gulp water down during sips. Will continue to monitor

## 2016-03-01 DIAGNOSIS — R1312 Dysphagia, oropharyngeal phase: Secondary | ICD-10-CM

## 2016-03-01 LAB — VANCOMYCIN, TROUGH: VANCOMYCIN TR: 26 ug/mL — AB (ref 15–20)

## 2016-03-01 MED ORDER — ONDANSETRON HCL 4 MG/2ML IJ SOLN
4.0000 mg | Freq: Three times a day (TID) | INTRAMUSCULAR | Status: DC | PRN
  Administered 2016-03-02 – 2016-03-05 (×5): 4 mg via INTRAVENOUS
  Filled 2016-03-01 (×5): qty 2

## 2016-03-01 MED ORDER — VANCOMYCIN HCL 500 MG IV SOLR
500.0000 mg | INTRAVENOUS | Status: AC
  Administered 2016-03-01: 500 mg via INTRAVENOUS
  Filled 2016-03-01: qty 500

## 2016-03-01 MED ORDER — HYDRALAZINE HCL 20 MG/ML IJ SOLN
5.0000 mg | Freq: Once | INTRAMUSCULAR | Status: AC
  Administered 2016-03-01: 5 mg via INTRAVENOUS
  Filled 2016-03-01: qty 1

## 2016-03-01 NOTE — Progress Notes (Signed)
[facility] PROGRESS NOTE  Victoria Fisher [id number] DOB: [birth year] DOA: 02/18/2016  PCP: Haywood Pao, MD  Brief History/Interval Summary: 82 year old female, SNF resident, PMH of chronic diastolic CHF, cor pulmonale, HTN, chronic lymphedema, CAD, chronic kidney disease, A. fib, chronic pain syndrome, COPD, frequent falls, hypothyroid and OSA presented to ED on 02/19/16 due to dyspnea and productive cough. She was initially admitted for acute bronchitis. She continued to decline from 02/25/16. CCM was consulted and patient was transferred to ICU under CCM care on 02/26/16. She was stabilized. Transferred back to the hospitalist service on 1/12. Noted to have oropharyngeal dysphagia with high aspiration risk.  Reason for Visit: Acute respiratory failure  Consultants: Pulmonology. Palliative medicine.  Procedures: None  Antibiotics:  Ceftin 1/5-1/6  Clindamycin 1/4-1/9   Doxycycline 1/2-1/3  Tamiflu-completed 5 day course  Meropenem and vancomycin 1/10 >  Subjective/Interval History: Patient's mental status noted to have improved this morning. She did not get any further doses of benzodiazepines. She is asking to drink water. Still remains distracted and repeats questions.  ROS: Denies any nausea or vomiting  Objective:  Vital Signs  Vitals:   03/01/16 0500 03/01/16 0605 03/01/16 0750 03/01/16 0812  BP: (!) 154/91 (!) 146/100 (!) 139/91   Pulse: 91 (!) 107 (!) 109   Resp: (!) 22 (!) 24 (!) 23   Temp:  97.1 F (36.2 C) (!) 96.7 F (35.9 C)   TempSrc:  Axillary Axillary   SpO2: 96% 97% 97% 99%  Weight:      Height:        Intake/Output Summary (Last 24 hours) at 03/01/16 0852 Last data filed at 03/01/16 0100  Gross per 24 hour  Intake              100 ml  Output              150 ml  Net              -50 ml   Filed Weights   02/23/16 0500 02/25/16 0500 02/25/16 1516  Weight: 71.8 kg (158 lb 4.8 oz) 71.2 kg (157 lb) 68.7 kg (151 lb 7.3 oz)     General appearance: More awake and alert this morning. Still distracted.  Resp: Improved breath sounds bilaterally. Lungs tachypneic. Less wheezing today compared to yesterday. Few crackles at the bases.  Cardio: regular rate and rhythm, S1, S2 normal, no murmur, click, rub or gallop GI: soft, non-tender; bowel sounds normal; no masses,  no organomegaly Neurologic: Awake, alert. Not agitated today. Disoriented. No facial asymmetry. Motor strength is equal bilateral upper and lower extremities.  Lab Results:  Data Reviewed: I have personally reviewed following labs and imaging studies  CBC:  Recent Labs Lab 02/24/16 0440 02/26/16 0831 02/27/16 0246 02/28/16 0554  WBC 5.3 6.6 9.2 10.4  HGB 12.1 12.9 13.5 14.0  HCT 41.0 40.8 43.3 43.7  MCV 109.9* 103.8* 103.6* 101.2*  PLT 81* 92* 108* 120*    Basic Metabolic Panel:  Recent Labs Lab 02/25/16 0525 02/26/16 0831 02/27/16 0246 02/28/16 0258 02/29/16 0300  NA 139 140 140 145 144  K 4.2 4.1 4.1 4.5 3.3*  CL 94* 94* 93* 98* 98*  CO2 36* 35* 33* 28 31  GLUCOSE 101* 127* 97 71 90  BUN 76* 87* 94* 93* 87*  CREATININE 1.60* 2.06* 2.15* 1.92* 1.65*  CALCIUM 9.7 9.6 9.7 9.7 9.9    GFR: Estimated Creatinine Clearance: 26.3 mL/min (by C-G formula based on SCr  of 1.65 mg/dL (H)).  Cardiac Enzymes:  Recent Labs Lab 02/26/16 0951  TROPONINI <0.03     Recent Results (from the past 240 hour(s))  Culture, blood (Routine X 2) w Reflex to ID Panel     Status: None   Collection Time: 02/22/16  3:40 PM  Result Value Ref Range Status   Specimen Description BLOOD LEFT ASSIST CONTROL  Final   Special Requests IN PEDIATRIC BOTTLE 3CC  Final   Culture NO GROWTH 5 DAYS  Final   Report Status 02/27/2016 FINAL  Final  Culture, blood (Routine X 2) w Reflex to ID Panel     Status: None   Collection Time: 02/22/16  3:45 PM  Result Value Ref Range Status   Specimen Description BLOOD RIGHT ASSIST CONTROL  Final   Special Requests IN  PEDIATRIC BOTTLE 3CC  Final   Culture NO GROWTH 5 DAYS  Final   Report Status 02/27/2016 FINAL  Final  MRSA PCR Screening     Status: Abnormal   Collection Time: 02/25/16  3:42 PM  Result Value Ref Range Status   MRSA by PCR POSITIVE (A) NEGATIVE Final    Comment:        The GeneXpert MRSA Assay (FDA approved for NASAL specimens only), is one component of a comprehensive MRSA colonization surveillance program. It is not intended to diagnose MRSA infection nor to guide or monitor treatment for MRSA infections. RESULT CALLED TO, READ BACK BY AND VERIFIED WITHOfilia Neas RN [id number] 02/25/16 A BROWNING       Radiology Studies: No results found.   Medications:  Scheduled: . ipratropium-albuterol  3 mL Nebulization Q6H  . levothyroxine  75 mcg Intravenous Daily  . mouth rinse  15 mL Mouth Rinse BID  . meropenem (MERREM) IV  1 g Intravenous Q12H  . methylPREDNISolone (SOLU-MEDROL) injection  60 mg Intravenous Q8H  . metoprolol  2.5 mg Intravenous Q6H  . mupirocin ointment  1 application Nasal BID  . vancomycin  750 mg Intravenous Q24H   Continuous:  [id number], bisacodyl, fentaNYL (SUBLIMAZE) injection, ipratropium-albuterol, naLOXone (NARCAN)  injection  Assessment/Plan:  Principal Problem:   Acute respiratory failure with hypoxia (HCC) Active Problems:   Essential hypertension   COPD (chronic obstructive pulmonary disease) (HCC)   Pulmonary hypertension   CKD (chronic kidney disease), stage III   Coronary artery disease   Chronic cor pulmonale (HCC)   Atrial fibrillation, chronic (HCC)   Obstructive sleep apnea   Cellulitis of lower extremity   Acute bronchitis   Bronchitis   Pressure injury of skin   Aspiration pneumonia (HCC)   Goals of care, counseling/discussion   Palliative care by specialist    Acute hypoxic and hypercapnic respiratory failure Multifactorial due to Influenza with acute bronchitis, COPD, pulmonary edema and aspiration. On 1/9, patient  was given a dose of Narcan with improvement in mental status. Noted increased work of breathing and hypoxia. BiPAP placed. CCM was consulted and patient was transferred to ICU on 02/26/16 for close monitoring. Antibiotics broadened, diuresis, bronchodilator nebulizations and hold sedating medications. Patient improved on BiPAP. She was taken off of it. Patient continued to have wheezing. Likely due to aspiration. She was given nebulizer treatments. Continue to wheeze and so was started on systemic steroids, which appear to be helping. Continue for now. Change to Levaquin.   Acute encephalopathy Encephalopathy was secondary to Ativan. Seems to be getting closer to her baseline this morning. Avoid sedative agents.   Oropharyngeal dysphagia with high aspiration  risk Patient was seen by speech pathology. She underwent swallow evaluation which revealed high aspiration risk. Reason for her dysphagia is not entirely clear. She has not had any strokes in the past. Recent CT scan of the head did not show any acute findings. She does not have any focal neurological deficits at this time. Discussed these issues with patient as well as her son. Seen again by speech therapist and again left nothing by mouth. Will discuss further with son. Palliative medicine continues to follow.  Acute on chronic diastolic CHF Patient was given intravenous Lasix during this hospitalization. She had worsening of her renal function so diuretics were held. Renal function is improving. Monitor urine output. Echocardiogram report from October 2017 reviewed. Systolic function was noted to be normal. Does have severe tricuspid regurgitation.  A. Fib Rate reasonably controlled. Not anticoagulation candidate due to recurrent falls. [place] Score: 4. Currently on scheduled IV metoprolol 2.5 MG every 6 hours.  Acute on Stage III chronic kidney disease with hypokalemia Creatinine has worsened from 1.5-1.6 range to 2.06. Diuretics were held. Renal  function is slowly improving. Monitor urine output. Recheck labs tomorrow.  Aspiration pneumonia At high risk for aspiration at this time. Keep nothing by mouth. Change to Levaquin. Cultures all negative.  Thrombocytopenia Counts have improved. No evidence for bleeding.  Influenza A Completed 5 days of Tamiflu on 1/7.  Hypothyroidism IV Synthroid while nothing by mouth.  Questionable Dementia This history is not entirely clear.  Recurrent falls/recent fall Left scalp hematoma by CT. PT recommends SNF placement.  Chronic lymphedema/? Milroy disease Continue Una boots  Chronic pain Management as indicated above.   Left leg wound per WOC  DVT Prophylaxis: SCDs as tolerated.    Code Status: Full code  Family Communication: No family at bedside.  Disposition Plan: Continue management as outlined above. Avoid sedatives. Palliative medicine to continue to talk to patient and son regarding dysphagia.    LOS: 11 days   [facility] Pager [phone] 03/01/2016, 8:52 AM  If 7PM-7AM, please contact night-coverage at [email or web address], password [facility]

## 2016-03-01 NOTE — Progress Notes (Signed)
Pharmacy Antibiotic Note Victoria Fisher is  82 y.o. female admitted on 02/18/2016 with pneumonia.  Pharmacy has been consulted for meropenem and vancomycin dosing in setting of CKD. VT 26 on vancomycin 750mg  q24h level > goal 15-20 Will decrease dose  Plan: 1. Meropenem 1 gram IV every 12 hours  2. Decrease vancomycin 500mg  q24hr 3. Monitor renal function   Height: 5\' 4"  (162.6 cm) Weight: 151 lb 7.3 oz (68.7 kg) IBW/kg (Calculated) : 54.7   Temp (24hrs), Avg:97.4 F (36.3 C), Min:96.7 F (35.9 C), Max:98 F (36.7 C)   Recent Labs Lab 02/24/16 0440 02/25/16 0525 02/26/16 0831 02/27/16 0246 02/28/16 0258 02/28/16 0554 02/29/16 0300 03/01/16 0725  WBC 5.3  --  6.6 9.2  --  10.4  --   --   CREATININE  --  1.60* 2.06* 2.15* 1.92*  --  1.65*  --   VANCOTROUGH  --   --   --   --   --   --   --  26*    Estimated Creatinine Clearance: 26.3 mL/min (by C-G formula based on SCr of 1.65 mg/dL (H)).    Allergies  Allergen Reactions  . Codeine Anaphylaxis, Hives and Nausea And Vomiting  . Gabapentin Swelling and Other (See Comments)    Hallucinations, severe facial swelling   . Other Other (See Comments)    Will not accept blood or blood products - patient is Jehovah's Witness  . Ciprofloxacin Hives and Nausea And Vomiting  . Latex Hives and Rash  . Penicillins Hives    Has patient had a PCN reaction causing immediate rash, facial/tongue/throat swelling, SOB or lightheadedness with hypotension: Yes Has patient had a PCN reaction causing severe rash involving mucus membranes or skin necrosis: Yes Has patient had a PCN reaction that required hospitalization Yes Has patient had a PCN reaction occurring within the last 10 years: Yes If all of the above answers are "NO", then may proceed with Cephalosporin use.  . Sulfonamide Derivatives Hives and Rash   Antimicrobials this admission:  Tamiflu 1/3 >> (1/8) Doxy 1/4 x 1 Clinda 1/4 >> 1/5; restart 1/6> 1/9 Ceftin 1/5 >>  1/6  Meropenem 1/10 >>  Vancomycin 1/10 >>   Microbiology results:  1/2 UCx >> insignificant growth 1/3 Influenza: positive  1/6 BCx: ngtd 1/8 MRSA PCR: negative   Bonnita Nasuti Pharm.D. CPP, BCPS Clinical Pharmacist [phone] 03/01/2016 10:10 AM

## 2016-03-02 ENCOUNTER — Other Ambulatory Visit: Payer: Self-pay | Admitting: Licensed Clinical Social Worker

## 2016-03-02 LAB — BASIC METABOLIC PANEL
ANION GAP: 16 — AB (ref 5–15)
BUN: 84 mg/dL — ABNORMAL HIGH (ref 6–20)
CO2: 34 mmol/L — AB (ref 22–32)
Calcium: 10.4 mg/dL — ABNORMAL HIGH (ref 8.9–10.3)
Chloride: 97 mmol/L — ABNORMAL LOW (ref 101–111)
Creatinine, Ser: 1.37 mg/dL — ABNORMAL HIGH (ref 0.44–1.00)
GFR calc Af Amer: 41 mL/min — ABNORMAL LOW (ref >60)
GFR calc non Af Amer: 36 mL/min — ABNORMAL LOW (ref >60)
GLUCOSE: 110 mg/dL — AB (ref 65–99)
POTASSIUM: 3.9 mmol/L (ref 3.5–5.1)
Sodium: 147 mmol/L — ABNORMAL HIGH (ref 135–145)

## 2016-03-02 MED ORDER — IPRATROPIUM-ALBUTEROL 0.5-2.5 (3) MG/3ML IN SOLN
3.0000 mL | Freq: Three times a day (TID) | RESPIRATORY_TRACT | Status: DC
  Administered 2016-03-02 – 2016-03-04 (×6): 3 mL via RESPIRATORY_TRACT
  Filled 2016-03-02 (×8): qty 3

## 2016-03-02 MED ORDER — MORPHINE SULFATE (CONCENTRATE) 10 MG/0.5ML PO SOLN
10.0000 mg | ORAL | Status: DC | PRN
  Administered 2016-03-03 (×2): 10 mg via ORAL
  Filled 2016-03-02 (×2): qty 0.5

## 2016-03-02 NOTE — Progress Notes (Signed)
Patient transferring to 2W29 report called to [facility]. Patients son/HPOA [name] notified of transfer.  Yani Lal, Tivis Ringer, RN

## 2016-03-02 NOTE — Patient Outreach (Addendum)
[facility]) Care Management  03/02/2016  AMIELLE PATIENT 1934-02-29 [id number]   Assessment- CSW remains in the hospital. CSW will continue to monitor case and await for discharge. It was noted today that her mental status has improved.   Plan-CSW will continue to await for hospital discharge and then will complete SNF or home visit.  [name], BSW, MSW, [email or web address] Phone: [phone] Fax: [phone]

## 2016-03-02 NOTE — Progress Notes (Addendum)
Palliative Care Progress Note  Patient being cleaned up by nurisng staff. She is weak and declining. She is NPO since admission, but no SLP follow since 1/13-she seems to be more mentally alert. No IV fluids or D5 going at this time and AM labs seem to be at or around baseline with mildly elevated Na. She is on IV fentanyl for pain control. She was on considerable doses of MSContin at home prior to admission for chronic pain and COPD. She is moaning with turning.  Will need to call her family in for a meeting to determine goals of care-  Her Living Will is on the chart despite it being documented that it is not on file. Reviewed her documents and records in detail. Discussed her care with RN at bedside.  Summary of Living Will:  1. No heroic measures, no BLOOD, no artifical feeding 2. HCPOA: Golden Pop and Joneen Boers Days as Alternate.  I have attempted to call Elliett Wadhwani this evening and left a voice mail message for a return call.  She is currently a FULL CODE. Based on her living will and her current condition I would advise against CPR. Before changing I would like to obtain consent from her HCPOA but should her condition dteriorate I would proceed conservatively with aggressive measures.  Will allow for sips and chips for her comfort if no overt signs of aspiration.  Time: 35 minutes. Greater than 50%  of this time was spent counseling and coordinating care related to the above assessment and plan.  Lane Hacker, DO Palliative Medicine [phone]

## 2016-03-02 NOTE — Progress Notes (Signed)
Nutrition Follow-up  DOCUMENTATION CODES:   Not applicable  INTERVENTION:   -RD will follow for diet advancement and supplement as appropriate -If pt/family desire aggressive measures, recommend:  Initiate Jevity 1.2 @ 25 ml/hr via NGT and increase by 10 ml every 12 hours to goal rate of 55 ml/hr.   30 ml Prostat daily  Tube feeding regimen provides 1684 kcal (100% of needs), 88 grams of protein, and 1065 ml of H2O.   NUTRITION DIAGNOSIS:   Inadequate oral intake related to nausea as evidenced by per patient/family report, meal completion < 25%.  Ongoing  GOAL:   Patient will meet greater than or equal to 90% of their needs  Unmet  MONITOR:   PO intake, Supplement acceptance, Labs, Skin, I & O's, Weight trends  REASON FOR ASSESSMENT:   Low Braden    ASSESSMENT:   82 y.o. female with history of diastolic CHF, cor pulmonale, hypertension, chronic lymphedema CAD chronic kidney disease A. fib presents to the ER because of shortness of breath and productive cough. Patient also has been feeling weak and has been having frequent falls.  1/9- rapid response called due to increased lethargy; transferred to SDU 1/9- s/p MBSS; pt with high aspiration risk on all consistencies 1/12- s/p repeat BSE, recommend remain NPO  Case discussed with RN, who reports pt remains NPO due to dysphagia. Palliative care team following for ongoing goals of care discussions.   Labs reviewed: Na: 147.   Diet Order:  Diet NPO time specified  Skin:  Wound (see comment) (stage II sacrum)  Last BM:  02/28/16  Height:   Ht Readings from Last 1 Encounters:  02/19/16 5\' 4"  (1.626 m)    Weight:   Wt Readings from Last 1 Encounters:  02/25/16 151 lb 7.3 oz (68.7 kg)    Ideal Body Weight:  54.54 kg  BMI:  Body mass index is 26 kg/m.  Estimated Nutritional Needs:   Kcal:  1550-1750  Protein:  85-95 grams  Fluid:  1.6-1.8 L.day  EDUCATION NEEDS:   No education needs identified  at this time  Ailed Defibaugh A. Jimmye Norman, RD, LDN, CDE Pager: [phone] After hours Pager: [phone]

## 2016-03-02 NOTE — Progress Notes (Signed)
[facility] PROGRESS NOTE  Victoria Fisher [id number] DOB: 02/01/1935 DOA: 02/18/2016  PCP: Haywood Pao, MD  Brief History/Interval Summary: 82 year old female, SNF resident, PMH of chronic diastolic CHF, cor pulmonale, HTN, chronic lymphedema, CAD, chronic kidney disease, A. fib, chronic pain syndrome, COPD, frequent falls, hypothyroid and OSA presented to ED on 02/19/16 due to dyspnea and productive cough. She was initially admitted for acute bronchitis. She continued to decline from 02/25/16. CCM was consulted and patient was transferred to ICU under CCM care on 02/26/16. She was stabilized. Transferred back to the hospitalist service on 1/12. Noted to have oropharyngeal dysphagia with high aspiration risk. Palliative medicine was consulted.  Reason for Visit: Acute respiratory failure. Dysphagia.  Consultants: Pulmonology. Palliative medicine.  Procedures: None  Antibiotics:  Ceftin 1/5-1/6  Clindamycin 1/4-1/9  Doxycycline 1/2-1/3  Tamiflu-completed 5 day course  Meropenem 1/10 >  Vancomycin 1/10 >1/14  Subjective/Interval History: Patient's mental status remains stable. She continues to ask for something to drink. States that breathing is better. Complains of some nausea.   ROS: Denies any vomiting  Objective:  Vital Signs  Vitals:   03/02/16 0121 03/02/16 0412 03/02/16 0735 03/02/16 0829  BP:  126/84 (!) 136/101   Pulse:  93 94   Resp:  15 (!) 22   Temp:  98.1 F (36.7 C) 97.8 F (36.6 C)   TempSrc:  Oral Oral   SpO2: 95% 99% 99% 98%  Weight:      Height:        Intake/Output Summary (Last 24 hours) at 03/02/16 0835 Last data filed at 03/01/16 2200  Gross per 24 hour  Intake              200 ml  Output                0 ml  Net              200 ml   Filed Weights   02/23/16 0500 02/25/16 0500 02/25/16 1516  Weight: 71.8 kg (158 lb 4.8 oz) 71.2 kg (157 lb) 68.7 kg (151 lb 7.3 oz)    General appearance: Continues to be awake and alert.  Still somewhat distracted. Resp: Improved breath sounds bilaterally. Normal effort. No wheezing heard today. Few crackles at the bases.  Cardio: regular rate and rhythm, S1, S2 normal, no murmur, click, rub or gallop GI: soft, non-tender; bowel sounds normal; no masses,  no organomegaly Neurologic: Awake, alert. No agitation. Disoriented. No facial asymmetry. Motor strength is equal bilateral upper and lower extremities.  Lab Results:  Data Reviewed: I have personally reviewed following labs and imaging studies  CBC:  Recent Labs Lab 02/26/16 0831 02/27/16 0246 02/28/16 0554  WBC 6.6 9.2 10.4  HGB 12.9 13.5 14.0  HCT 40.8 43.3 43.7  MCV 103.8* 103.6* 101.2*  PLT 92* 108* 120*    Basic Metabolic Panel:  Recent Labs Lab 02/26/16 0831 02/27/16 0246 02/28/16 0258 02/29/16 0300 03/02/16 0214  NA 140 140 145 144 147*  K 4.1 4.1 4.5 3.3* 3.9  CL 94* 93* 98* 98* 97*  CO2 35* 33* 28 31 34*  GLUCOSE 127* 97 71 90 110*  BUN 87* 94* 93* 87* 84*  CREATININE 2.06* 2.15* 1.92* 1.65* 1.37*  CALCIUM 9.6 9.7 9.7 9.9 10.4*    GFR: Estimated Creatinine Clearance: 31.7 mL/min (by C-G formula based on SCr of 1.37 mg/dL (H)).  Cardiac Enzymes:  Recent Labs Lab 02/26/16 0951  TROPONINI <0.03  Recent Results (from the past 240 hour(s))  Culture, blood (Routine X 2) w Reflex to ID Panel     Status: None   Collection Time: 02/22/16  3:40 PM  Result Value Ref Range Status   Specimen Description BLOOD LEFT ASSIST CONTROL  Final   Special Requests IN PEDIATRIC BOTTLE 3CC  Final   Culture NO GROWTH 5 DAYS  Final   Report Status 02/27/2016 FINAL  Final  Culture, blood (Routine X 2) w Reflex to ID Panel     Status: None   Collection Time: 02/22/16  3:45 PM  Result Value Ref Range Status   Specimen Description BLOOD RIGHT ASSIST CONTROL  Final   Special Requests IN PEDIATRIC BOTTLE 3CC  Final   Culture NO GROWTH 5 DAYS  Final   Report Status 02/27/2016 FINAL  Final  MRSA PCR  Screening     Status: Abnormal   Collection Time: 02/25/16  3:42 PM  Result Value Ref Range Status   MRSA by PCR POSITIVE (A) NEGATIVE Final    Comment:        The GeneXpert MRSA Assay (FDA approved for NASAL specimens only), is one component of a comprehensive MRSA colonization surveillance program. It is not intended to diagnose MRSA infection nor to guide or monitor treatment for MRSA infections. RESULT CALLED TO, READ BACK BY AND VERIFIED WITHOfilia Neas RN [id number] 02/25/16 A BROWNING       Radiology Studies: No results found.   Medications:  Scheduled: . ipratropium-albuterol  3 mL Nebulization TID  . levothyroxine  75 mcg Intravenous Daily  . mouth rinse  15 mL Mouth Rinse BID  . meropenem (MERREM) IV  1 g Intravenous Q12H  . methylPREDNISolone (SOLU-MEDROL) injection  60 mg Intravenous Q8H  . metoprolol  2.5 mg Intravenous Q6H   Continuous:  [id number], bisacodyl, fentaNYL (SUBLIMAZE) injection, ipratropium-albuterol, naLOXone (NARCAN)  injection, ondansetron (ZOFRAN) IV  Assessment/Plan:  Principal Problem:   Acute respiratory failure with hypoxia (HCC) Active Problems:   Essential hypertension   COPD (chronic obstructive pulmonary disease) (HCC)   Pulmonary hypertension   CKD (chronic kidney disease), stage III   Coronary artery disease   Chronic cor pulmonale (HCC)   Atrial fibrillation, chronic (HCC)   Obstructive sleep apnea   Cellulitis of lower extremity   Acute bronchitis   Bronchitis   Pressure injury of skin   Aspiration pneumonia (HCC)   Goals of care, counseling/discussion   Palliative care by specialist    Acute hypoxic and hypercapnic respiratory failure Multifactorial due to Influenza with acute bronchitis, COPD, pulmonary edema and aspiration. On 1/9, patient was given a dose of Narcan with improvement in mental status. Noted increased work of breathing and hypoxia. BiPAP placed. CCM was consulted and patient was transferred to ICU on  02/26/16 for close monitoring. Antibiotics broadened, diuresis, bronchodilator nebulizations and hold sedating medications. Patient improved on BiPAP. She was taken off of it. Patient continued to have wheezing. Likely due to aspiration. She was given nebulizer treatments. Continue to wheeze and so was started on systemic steroids, which appear to be helping. Patient has been slowly improving from a respiratory standpoint.   Aspiration pneumonia At high risk for aspiration at this time. Keep nothing by mouth. Cultures are all negative. Plan was to change to Levaquin. However, patient has an allergy to ciprofloxacin and penicillin. Plan is to continue meropenem to complete total of 7 days. Vancomycin was discontinued..  Acute encephalopathy Secondary to Ativan. Avoid sedative drugs. Mental status  appears to be close to baseline.  Oropharyngeal dysphagia with high aspiration risk Patient was seen by speech pathology. She underwent swallow evaluation which revealed high aspiration risk. Reason for her dysphagia is not entirely clear. She has not had any strokes in the past. Recent CT scan of the head did not show any acute findings. She does not have any focal neurological deficits at this time. Discussed these issues with patient as well as her son. Seen again by speech therapist and again left nothing by mouth. Palliative medicine to discuss with son.  Acute on chronic diastolic CHF Patient was given intravenous Lasix during this hospitalization. She had worsening of her renal function so diuretics were held. Renal function is improving. Monitor urine output. Echocardiogram report from October 2017 reviewed. Systolic function was noted to be normal. Does have severe tricuspid regurgitation. Seems euvolemic currently.  A. Fib Rate reasonably controlled. Not anticoagulation candidate due to recurrent falls. [place] Score: 4. Currently on scheduled IV metoprolol 2.5 MG every 6 hours.  Acute on Stage III  chronic kidney disease with hypokalemia Creatinine has worsened from 1.5-1.6 range to 2.06. Diuretics were held. Renal function is slowly improving and now close to baseline. Monitor urine output.  Thrombocytopenia Counts have improved. No evidence for bleeding.  Influenza A Completed 5 days of Tamiflu on 1/7.  Hypothyroidism IV Synthroid while nothing by mouth.  Questionable Dementia This history is not entirely clear.  Recurrent falls/recent fall Left scalp hematoma by CT. PT recommends SNF placement.  Chronic lymphedema/? Milroy disease Continue Una boots  Chronic pain Management as indicated above. Her long-acting morphine held due to respiratory depression. Currently she cannot take orally due to dysphagia.  Left leg wound per WOC  DVT Prophylaxis: SCDs as tolerated.    Code Status: Full code  Family Communication: No family at bedside.  Disposition Plan: Continue management as outlined above. Avoid sedatives. Palliative medicine to talk to patient and son regarding dysphagia. Stable for transfer to floor.    LOS: 12 days   [facility] Pager [phone] 03/02/2016, 8:35 AM  If 7PM-7AM, please contact night-coverage at [email or web address], password [facility]

## 2016-03-03 DIAGNOSIS — I272 Pulmonary hypertension, unspecified: Secondary | ICD-10-CM

## 2016-03-03 DIAGNOSIS — I2781 Cor pulmonale (chronic): Secondary | ICD-10-CM

## 2016-03-03 MED ORDER — ACETAMINOPHEN 325 MG PO TABS: 650.0000 mg | ORAL_TABLET | Freq: Three times a day (TID) | ORAL | Status: DC

## 2016-03-03 MED ORDER — SODIUM CHLORIDE 0.45 % IV SOLN
INTRAVENOUS | Status: DC
  Administered 2016-03-03: 16:00:00 via INTRAVENOUS

## 2016-03-03 MED ORDER — ENSURE ENLIVE PO LIQD
237.0000 mL | Freq: Two times a day (BID) | ORAL | Status: DC
  Administered 2016-03-04 – 2016-03-06 (×5): 237 mL via ORAL

## 2016-03-03 MED ORDER — ACETAMINOPHEN 160 MG/5ML PO SOLN
650.0000 mg | Freq: Three times a day (TID) | ORAL | Status: DC
  Administered 2016-03-03 – 2016-03-06 (×8): 650 mg via ORAL
  Filled 2016-03-03 (×8): qty 20.3

## 2016-03-03 MED ORDER — METHYLPREDNISOLONE SODIUM SUCC 40 MG IJ SOLR
40.0000 mg | Freq: Three times a day (TID) | INTRAMUSCULAR | Status: DC
  Administered 2016-03-03 – 2016-03-05 (×5): 40 mg via INTRAVENOUS
  Filled 2016-03-03 (×5): qty 1

## 2016-03-03 MED ORDER — MORPHINE SULFATE (CONCENTRATE) 10 MG/0.5ML PO SOLN
10.0000 mg | Freq: Three times a day (TID) | ORAL | Status: DC
  Administered 2016-03-03 – 2016-03-06 (×8): 10 mg via ORAL
  Filled 2016-03-03 (×8): qty 0.5

## 2016-03-03 NOTE — Progress Notes (Signed)
Occupational Therapy Evaluation Patient Details Name: Victoria Fisher MRN: [id number] DOB: 09-Apr-1934 Today's Date: 03/03/2016    History of Present Illness 82 y.o. female who came to [facility] on 02/18/16 due to weakness and frequent falls.  Pt with increased SOB, (+) for flu.  Pt with respiratory distress 1/9 and P.T. order cancelled. Pt with significant PMHx of TB, Pulmonary HTN, milroy's disease (lymphatic disorder), kidney failure, frequent falls, bil LE edema, CAD, COPD, colon CA s/p resection, chronic diastolic CHF, A-fib, and chronic vertigo.    Clinical Impression   PTA, pt lived at home with husband. Pt states she was independent with ADL and mobility @ RW level. Pt currently requires mod A +2 with mobility and max A for ADL. Pt limited by fatigue and states "I haven't eaten, I'm just so weak". Pt will benefit from rehab at SNF to maximize her ability to care for herself. Will follow acutely to facilitate DC to next venue of care and address established goals.   Pt wears a soft collar for comfort at home. Recommend obtaining a soft collar from ortho tech to improve her comfort.     Follow Up Recommendations  SNF;Supervision/Assistance - 24 hour    Equipment Recommendations  Other (comment) (TBA at next venue)    Recommendations for Other Services       Precautions / Restrictions Precautions Precautions: Fall Precaution Comments: pt with significant hx of frequent falls, bil LE wrap Restrictions Weight Bearing Restrictions: No      Mobility Bed Mobility Overal bed mobility: Needs Assistance Bed Mobility: Supine to Sit       Sit to supine: Total assist   General bed mobility comments: cues for sequence with assist to elevate trunk and scoot hips to EOB  Transfers Overall transfer level: Needs assistance Equipment used: Rolling walker (2 wheeled) Transfers: Sit to/from [facility] Sit to Stand: Mod assist Stand pivot transfers: Mod assist;+2  safety/equipment       General transfer comment: Pt asking to sit after standing@ 10 seconds    Balance Overall balance assessment: Needs assistance Sitting-balance support: Feet supported;Single extremity supported Sitting balance-Leahy Scale: Fair Sitting balance - Comments: Mod to max assist EOB to maintain sitting balance.  Pt very lethargic reporting nausea, unable to hold head up and eyes closed.  Postural control: Other (comment) (anterior lean with kyphotic posture and nodding off forward) Standing balance support: Bilateral upper extremity supported Standing balance-Leahy Scale: Poor Standing balance comment: mod assist to support trunk and RW in standing.                             ADL Overall ADL's : Needs assistance/impaired Eating/Feeding: NPO Eating/Feeding Details (indicate cue type and reason): asking for ice chips Grooming: Moderate assistance   Upper Body Bathing: Moderate assistance;Sitting   Lower Body Bathing: Maximal assistance;Bed level;Sit to/from stand   Upper Body Dressing : Maximal assistance;Sitting   Lower Body Dressing: Maximal assistance;Sit to/from stand   Toilet Transfer: +2 for physical assistance;Moderate assistance   Toileting- Clothing Manipulation and Hygiene: Total assistance Toileting - Clothing Manipulation Details (indicate cue type and reason): incontinent     Functional mobility during ADLs: Rolling walker;Moderate assistance;+2 for physical assistance General ADL Comments: only able to stand less     Vision  wears glasses   Perception     Praxis      Pertinent Vitals/Pain Pain Assessment: Faces Faces Pain Scale: Hurts little more  Pain Location: bil legs Pain Descriptors / Indicators: Aching;Sore Pain Intervention(s): Limited activity within patient's tolerance     Hand Dominance Right   Extremity/Trunk Assessment Upper Extremity Assessment Upper Extremity Assessment: Generalized weakness (B UE  shoulder AROM limited by cervical limitations)   Lower Extremity Assessment Lower Extremity Assessment: Defer to PT evaluation RLE Deficits / Details: bil LEs weak, pt's right leg is weaker due to pain with movement and WB.  She was able to stand with the support of therapist and 4-wheeled RW for < 1 minute.   RLE Sensation: history of peripheral neuropathy LLE Deficits / Details: left leg she can lift weakly against gravity, but it is edematous and wrapped (per pt for cellulitis), this leg is stronger than her right leg, but still weak overall.  LLE Sensation: history of peripheral neuropathy   Cervical / Trunk Assessment Cervical / Trunk Assessment: Kyphotic Cervical / Trunk Exceptions: Pt with severe kyphosis and her neck and chin almost touch her chest.  She normally wears a soft collar for comfort, but reports it was left at home.    Communication Communication Communication: No difficulties   Cognition Arousal/Alertness: Awake/alert Behavior During Therapy: WFL for tasks assessed/performed Overall Cognitive Status: Impaired/Different from baseline Area of Impairment: Orientation;Memory;Safety/judgement;Awareness;Problem solving Orientation Level: Time;Place   Memory: Decreased short-term memory   Safety/Judgement: Decreased awareness of safety Awareness: Emergent Problem Solving: Slow processing     General Comments       Exercises       Shoulder Instructions      Home Living Family/patient expects to be discharged to:: Skilled nursing facility Living Arrangements: Spouse/significant other;Other (Comment) Available Help at Discharge: Personal care attendant;Available PRN/intermittently Type of Home: Independent living facility Home Access: Level entry     Home Layout: One level     Bathroom Shower/Tub: Occupational psychologist: Standard Bathroom Accessibility: Yes   Home Equipment: Environmental consultant - 2 wheels;Cane - single point;Grab bars - toilet;Grab bars -  tub/shower;Shower seat          Prior Functioning/Environment Level of Independence: Independent with assistive device(s);Needs assistance  Gait / Transfers Assistance Needed: walks with a rollator     Comments: patient states that she cares for husband who has dementia        OT Problem List: Decreased strength;Decreased range of motion;Decreased activity tolerance;Impaired balance (sitting and/or standing);Decreased safety awareness;Decreased knowledge of use of DME or AE;Cardiopulmonary status limiting activity;Impaired UE functional use;Pain   OT Treatment/Interventions: Self-care/ADL training;Therapeutic exercise;Energy conservation;DME and/or AE instruction;Therapeutic activities;Cognitive remediation/compensation;Patient/family education;Balance training    OT Goals(Current goals can be found in the care plan section) Acute Rehab OT Goals Patient Stated Goal: to be able to walk OT Goal Formulation: With patient Time For Goal Achievement: 03/17/16 Potential to Achieve Goals: Good  OT Frequency: Min 2X/week   Barriers to D/C: Decreased caregiver support          Co-evaluation              End of Session Equipment Utilized During Treatment: Gait belt;Rolling walker;Oxygen Nurse Communication: Mobility status  Activity Tolerance: Patient limited by fatigue Patient left: in bed;with call bell/phone within reach;with nursing/sitter in room   Time: 1143-1205 OT Time Calculation (min): 22 min Charges:    G-Codes:    Vencent Hauschild,HILLARY 03/23/16, 4:10 PM   [facility], OT/L  [phone] Mar 23, 2016

## 2016-03-03 NOTE — Evaluation (Signed)
Physical Therapy Evaluation Patient Details Name: Victoria Fisher MRN: [id number] DOB: 05/24/1934 Today's Date: 03/03/2016   History of Present Illness  82 y.o. female who came to [facility] on 02/[facility]/16 due to weakness and frequent falls.  Pt with increased SOB, (+) for flu.  Pt with respiratory distress 1/9 and P.T. order cancelled. Pt with significant PMHx of TB, Pulmonary HTN, milroy's disease (lymphatic disorder), kidney failure, frequent falls, bil LE edema, CAD, COPD, colon CA s/p resection, chronic diastolic CHF, A-fib, and chronic vertigo.   Clinical Impression  Pt pleasant and very eager to mobilize. Pt reports fatigue with activity and is limited by endurance. Pt with generalized weakness, decreased ability with transfers, gait and activity tolerance who will benefit from acute therapy to maximize function and independence to decrease burden of care. Recommend OOB daily with nursing staff. Pt educated for bil LE HEP and encouraged to perform daily, limited by fatigue end of session and only able to complete one exercise.     Follow Up Recommendations SNF;Supervision/Assistance - 24 hour    Equipment Recommendations  None recommended by PT    Recommendations for Other Services       Precautions / Restrictions Precautions Precautions: Fall Precaution Comments: pt with significant hx of frequent falls, bil LE wrap      Mobility  Bed Mobility Overal bed mobility: Needs Assistance Bed Mobility: Supine to Sit     Supine to sit: Mod assist     General bed mobility comments: cues for sequence with assist to elevate trunk and scoot hips to EOB  Transfers Overall transfer level: Needs assistance   Transfers: Sit to/from Stand;Stand Pivot Transfers Sit to Stand: Mod assist Stand pivot transfers: Mod assist;+2 safety/equipment       General transfer comment: cues for hand placement and sequence with mod assist for anterior translation and rise from surface. Pt able to take  pivotal steps with RW bed to chair and denied attempting further ambulation despite education and encouragement  Ambulation/Gait                Stairs            Wheelchair Mobility    Modified Rankin (Stroke Patients Only)       Balance Overall balance assessment: Needs assistance   Sitting balance-Leahy Scale: Fair       Standing balance-Leahy Scale: Poor                               Pertinent Vitals/Pain Pain Score: 9  Pain Location: bil legs Pain Descriptors / Indicators: Aching;Sore Pain Intervention(s): Limited activity within patient's tolerance;Repositioned;Monitored during session;Patient requesting pain meds-RN notified    Home Living Family/patient expects to be discharged to:: Skilled nursing facility Living Arrangements: Spouse/significant other;Other (Comment) Available Help at Discharge: Personal care attendant;Available PRN/intermittently Type of Home: Independent living facility Home Access: Level entry     Home Layout: One level Home Equipment: Walker - 2 wheels;Cane - single point;Grab bars - toilet;Grab bars - tub/shower;Shower seat      Prior Function Level of Independence: Independent with assistive device(s);Needs assistance   Gait / Transfers Assistance Needed: walks with a rollator           Hand Dominance        Extremity/Trunk Assessment   Upper Extremity Assessment Upper Extremity Assessment: Generalized weakness    Lower Extremity Assessment Lower Extremity Assessment: Generalized weakness    Cervical /  Trunk Assessment Cervical / Trunk Assessment: Kyphotic Cervical / Trunk Exceptions: Pt with severe kyphosis and her neck and chin almost touch her chest.  She normally wears a soft collar for comfort, but reports it was left at home.   Communication   Communication: No difficulties  Cognition Arousal/Alertness: Awake/alert Behavior During Therapy: WFL for tasks assessed/performed Overall  Cognitive Status: Impaired/Different from baseline Area of Impairment: Orientation;Memory Orientation Level: Time;Place   Memory: Decreased short-term memory              General Comments      Exercises General Exercises - Lower Extremity Long Arc Quad: AROM;Both;Seated;10 reps   Assessment/Plan    PT Assessment Patient needs continued PT services  PT Problem List Decreased strength;Decreased activity tolerance;Decreased balance;Decreased mobility;Decreased range of motion;Decreased knowledge of use of DME;Decreased safety awareness;Decreased knowledge of precautions;Pain;Decreased skin integrity;Cardiopulmonary status limiting activity          PT Treatment Interventions DME instruction;Gait training;Functional mobility training;Therapeutic activities;Therapeutic exercise;Balance training;Patient/family education    PT Goals (Current goals can be found in the Care Plan section)  Acute Rehab PT Goals Patient Stated Goal: to be able to walk PT Goal Formulation: With patient Time For Goal Achievement: 03/17/16 Potential to Achieve Goals: Fair    Frequency Min 2X/week   Barriers to discharge Decreased caregiver support      Co-evaluation               End of Session Equipment Utilized During Treatment: Gait belt;Oxygen Activity Tolerance: Patient tolerated treatment well Patient left: in chair;with call bell/phone within reach;with chair alarm set Nurse Communication: Mobility status         Time: [id number] PT Time Calculation (min) (ACUTE ONLY): 16 min   Charges:   PT Evaluation $PT Eval Moderate Complexity: 1 Procedure     PT G Codes:        Shyia Fillingim B Shadow Stiggers 2016/03/21, 10:15 AM  Elwyn Reach, [place]

## 2016-03-03 NOTE — Progress Notes (Signed)
Received call from Dr. Hilma Favors with palliative care. She was unable to reach the speech therapist regarding the patients PO status. Dr. Hilma Favors gave a verbal order to start patient on a Dysphagia 1 diet and she is still trying to get in contact with family. Patient is tolerating PO intake well with no signs of aspiration.  Cyndia Bent RN

## 2016-03-03 NOTE — Progress Notes (Signed)
Progress Note    Victoria Fisher  [id number] DOB: December 20, 1934  DOA: 02/18/2016 PCP: Haywood Pao, MD    Brief Narrative:   Chief complaint: Follow-up respiratory failure and dysphasia  Victoria Fisher is an 82 y.o. female, SNF resident, PMH of chronic diastolic CHF, cor pulmonale, HTN, chronic lymphedema, CAD, chronic kidney disease, A. fib, chronic pain syndrome, COPD, frequent falls, hypothyroid and OSA presented to ED on 02/19/16 due to dyspnea and productive cough. She was initially admitted for acute bronchitis.She continued to decline from 02/25/16. CCM was consulted and patient was transferred to ICU under CCM care on 02/26/16. She was stabilized. Transferred back to the hospitalist service on 1/12. Noted to have oropharyngeal dysphagia with high aspiration risk. Palliative medicine was consulted.  Assessment/Plan:   Extensive chart review completed.  Principal problems:  Acute hypoxic and hypercapnic respiratory failure Multifactorial due to Influenza with acute bronchitis, COPD, pulmonary edema and aspiration. On 1/9, patient was given a dose of Narcan with improvement in mental status. Noted increased work of breathing and hypoxia. BiPAP placed. CCM was consulted and patient was transferred to ICU on 02/26/16 for close monitoring. Antibiotics broadened, diuresis, bronchodilator nebulizations and hold sedating medications. Patient improved on BiPAP. She was taken off of it. Patient continued to have wheezing. Likely due to aspiration. She was given nebulizer treatments. Continue to wheeze and so was started on systemic steroids, which appear to be helping. Patient's respiratory status is stable. Wean steroids (Solu-Medrol 60 mg every 8 hours---> 40 mg every 8 hours).  Aspiration pneumonia At high risk for aspiration at this time. Still nothing by mouth except for ice chips. Cultures are all negative. Continue meropenem to complete total of 7 days. Vancomycin was discontinued.  Speech therapy reevaluation requested.  Acute encephalopathy Secondary to Ativan. Avoid sedative drugs. Mental status appears to be close to baseline.  Active problems:  Hypernatremia We'll resume IV fluids until goals of care can be established. She was largely nothing by mouth and complaining of thirst with  elevated sodium levels.  Oropharyngeal dysphagia with high aspiration risk Patient was seen by speech pathology. She underwent swallow evaluation which revealed high aspiration risk. Reason for her dysphagia is not entirely clear. She has not had any strokes in the past. Recent CT scan of the head did not show any acute findings. She does not have any focal neurological deficits at this time. Palliative medicine is evaluating goals of care as she remains nothing by mouth. Speech therapy reevaluation requested.  Acute on chronic diastolic CHF Patient was given intravenous Lasix during this hospitalization. She had worsening of her renal function so diuretics were held. Renal function is improving. Monitor urine output. Echocardiogram report from October 2017 reviewed. Systolic function was noted to be normal. Does have severe tricuspid regurgitation.  Continues to appear euvolemic.  A. Fib Rate reasonably controlled. Not anticoagulation candidate due to recurrent falls. [place] Score: 4. Currently on scheduled IV metoprolol 2.5 MG every 6 hours.  Acute on Stage III chronic kidney disease with hypokalemia Creatinine has worsened from 1.5-1.6 range to 2.06. Diuretics were held. Renal function is slowly improving and now close to baseline. Monitor urine output.  Thrombocytopenia Counts have improved. No evidence for bleeding.  Influenza A Completed 5 days of Tamiflu on 1/7.  Hypothyroidism Continue IV Synthroid while nothing by mouth.  Questionable Dementia This history is not entirely clear.  Recurrent falls/recent fall Left scalp hematoma by CT. PT recommends SNF  placement.  Chronic lymphedema/? [place] disease Continue Una boots, requested wound care evaluation to change the boots per patient request.  Chronic pain Management as indicated above. Her long-acting morphine held due to respiratory depression. Currently she cannot take orally due to dysphagia.  Left leg wound Management per WOC.  Family Communication/Anticipated D/C date and plan/Code Status   DVT prophylaxis: Not on medications, would clarify goals of care before putting her on DVT prophylaxis. Una boots preclude SCDs. Code Status: Full Code.  Family Communication: No family currently at the bedside. Disposition Plan: SNF once goals of care established.   Medical Consultants:    Palliative Care   Procedures:    None  Anti-Infectives:    Ceftin 1/5-1/6  Clindamycin 1/4-1/9  Doxycycline 1/2-1/3  Tamiflu-completed 5 day course  Meropenem 1/10 >  Vancomycin 1/10 >1/14  Subjective:   Patient is awake, pleasant, interactive and sitting up in a chair today. She denies chest pain or shortness of breath. She says she is thirsty and wants to drink. No nausea or vomiting noted.  Objective:    Vitals:   03/02/16 1943 03/02/16 2124 03/03/16 0104 03/03/16 0458  BP: (!) 141/88  (!) 145/93 132/85  Pulse: 93 89  99  Resp: 18 18  18   Temp: 98.1 F (36.7 C)   97.7 F (36.5 C)  TempSrc: Oral   Axillary  SpO2: 100% 100%  97%  Weight:      Height:        Intake/Output Summary (Last 24 hours) at 03/03/16 0758 Last data filed at 03/02/16 [id number]  Gross per 24 hour  Intake              100 ml  Output                0 ml  Net              100 ml   Filed Weights   02/23/16 0500 02/25/16 0500 02/25/16 1516  Weight: 71.8 kg (158 lb 4.8 oz) 71.2 kg (157 lb) 68.7 kg (151 lb 7.3 oz)    Exam: General exam: Appears calm and comfortable.  Respiratory system: A few bibasilar crackles. Respiratory effort normal. Cardiovascular system: S1 & S2 heard, RRR. No JVD,  rubs,  gallops or clicks. 2/6 systolic ejection murmur. Gastrointestinal system: Abdomen is nondistended, soft and nontender. No organomegaly or masses felt. Normal bowel sounds heard. Central nervous system: Alert and oriented x 2. No focal neurological deficits. Extremities: No clubbing,  or cyanosis. No edema. Skin: No rashes, lesions or ulcers. Psychiatry: Judgement and insight appear impaired. Mood & affect appropriate.   Data Reviewed:   I have personally reviewed following labs and imaging studies:  Labs: Basic Metabolic Panel:  Recent Labs Lab 02/26/16 0831 02/27/16 0246 02/28/16 0258 02/29/16 0300 03/02/16 0214  NA 140 140 145 144 147*  K 4.1 4.1 4.5 3.3* 3.9  CL 94* 93* 98* 98* 97*  CO2 35* 33* 28 31 34*  GLUCOSE 127* 97 71 90 110*  BUN 87* 94* 93* 87* 84*  CREATININE 2.06* 2.15* 1.92* 1.65* 1.37*  CALCIUM 9.6 9.7 9.7 9.9 10.4*   GFR Estimated Creatinine Clearance: 31.7 mL/min (by C-G formula based on SCr of 1.37 mg/dL (H)). Liver Function Tests: No results for input(s): AST, ALT, ALKPHOS, BILITOT, PROT, ALBUMIN in the last 168 hours. No results for input(s): LIPASE, AMYLASE in the last 168 hours. No results for input(s): AMMONIA in the last 168 hours. Coagulation profile No results for  input(s): INR, PROTIME in the last 168 hours.  CBC:  Recent Labs Lab 02/26/16 0831 02/27/16 0246 02/28/16 0554  WBC 6.6 9.2 10.4  HGB 12.9 13.5 14.0  HCT 40.8 43.3 43.7  MCV 103.8* 103.6* 101.2*  PLT 92* 108* 120*   Cardiac Enzymes:  Recent Labs Lab 02/26/16 0951  TROPONINI <0.03   BNP (last 3 results) No results for input(s): PROBNP in the last 8760 hours. CBG: No results for input(s): GLUCAP in the last 168 hours. D-Dimer: No results for input(s): DDIMER in the last 72 hours. Hgb A1c: No results for input(s): HGBA1C in the last 72 hours. Lipid Profile: No results for input(s): CHOL, HDL, LDLCALC, TRIG, CHOLHDL, LDLDIRECT in the last 72 hours. Thyroid function  studies: No results for input(s): TSH, T4TOTAL, T3FREE, THYROIDAB in the last 72 hours.  Invalid input(s): FREET3 Anemia work up: No results for input(s): VITAMINB12, FOLATE, FERRITIN, TIBC, IRON, RETICCTPCT in the last 72 hours. Sepsis Labs:  Recent Labs Lab 02/26/16 0831 02/26/16 0951 02/27/16 0246 02/28/16 0554  PROCALCITON  --  0.19 1.53 1.20  WBC 6.6  --  9.2 10.4    Microbiology Recent Results (from the past 240 hour(s))  Culture, blood (Routine X 2) w Reflex to ID Panel     Status: None   Collection Time: 02/22/16  3:40 PM  Result Value Ref Range Status   Specimen Description BLOOD LEFT ASSIST CONTROL  Final   Special Requests IN PEDIATRIC BOTTLE 3CC  Final   Culture NO GROWTH 5 DAYS  Final   Report Status 02/27/2016 FINAL  Final  Culture, blood (Routine X 2) w Reflex to ID Panel     Status: None   Collection Time: 02/22/16  3:45 PM  Result Value Ref Range Status   Specimen Description BLOOD RIGHT ASSIST CONTROL  Final   Special Requests IN PEDIATRIC BOTTLE 3CC  Final   Culture NO GROWTH 5 DAYS  Final   Report Status 02/27/2016 FINAL  Final  MRSA PCR Screening     Status: Abnormal   Collection Time: 02/25/16  3:42 PM  Result Value Ref Range Status   MRSA by PCR POSITIVE (A) NEGATIVE Final    Comment:        The GeneXpert MRSA Assay (FDA approved for NASAL specimens only), is one component of a comprehensive MRSA colonization surveillance program. It is not intended to diagnose MRSA infection nor to guide or monitor treatment for MRSA infections. RESULT CALLED TO, READ BACK BY AND VERIFIED WITHOfilia Neas RN 1820 02/25/16 A BROWNING     Radiology: No results found.  Medications:   . ipratropium-albuterol  3 mL Nebulization TID  . levothyroxine  75 mcg Intravenous Daily  . mouth rinse  15 mL Mouth Rinse BID  . meropenem (MERREM) IV  1 g Intravenous Q12H  . methylPREDNISolone (SOLU-MEDROL) injection  60 mg Intravenous Q8H  . metoprolol  2.5 mg  Intravenous Q6H   Continuous Infusions:  Medical decision making is of high complexity and this patient is at high risk of deterioration, therefore this is a level 3 visit.  (> 4 problem points, 4 data points, high risk)  Medical decision making is moderately complex therefore this is a level 2 visit. (> 3 problem points, >3 data points, moderate risk: Need 2 out of 3)   Medical decision making is of low complexity and therefore this is a level 1 visit. (> 2 problem points, >2 data points, low risk: Need 2 out of  3)  Problems/DDx Points   Self limited or minor (max 2)       1   Established problem, stable       1   Established problem, worsening       2   New problem, no additional W/U planned (max 1)       3   New problem, additional W/U planned        4    Data Reviewed Points   Review/order clinical lab tests       1   Review/order x-rays       1   Review/order tests (Echo, EKG, PFTs, etc)       1   Discussion of test results w/ performing MD       1   Independent review of image, tracing or specimen       2   Decision to obtain old records       1   Review and summation of old records       2    Level of risk Presenting prob Diagnostics Management   Minimal 1 self limited/minor Labs CXR EKG/EEG U/A U/S Rest Gargles Bandages Dressings   Low 2 or more self limited/minor 1 stable chronic Acute uncomplicated illness Tests (PFTS) Non-CV imaging Arterial labs Biopsies of skin OTC drugs Minor surgery-no risk PT OT IVF without additives    Moderate 1 or more chronic illnesses w/ mild exac, progression or S/E from tx 2 or more stable chronic illnesses Undiagnosed new problem w/ uncertain prognosis Acute complicated injury  Stress tests Endoscopies with no risk factors Deep needle or incisional bx CV imaging without risk LP Thoracentesis Paracentesis Minor surgery w/ risks Elective major surgery w/ no risk (open, percutaneous or endoscopic) Prescription  drugs Therapeutic nucl med IVF with additives Closed tx of fracture/dislocation    High Severe exac of chronic illness Acute or chronic illness/injury may pose a threat to life or bodily function (ARF) Change in neuro status    CV imaging w/ contrast and risk Cardio electophysiologic tests Endoscopies w/ risk Discography Elective major surgery Emergency major surgery Parenteral controlled substances Drug therapy req monitoring for toxicity DNR/de-escalation of care    MDM Prob points Data points Risk   Straightforward    <1    <1    Min   Low complexity    2    2    Low   Moderate    3    3    Mod   High Complexity    4 or more    4 or more    High      LOS: 13 days   RAMA,CHRISTINA  [facility] Pager [phone]. If unable to reach me by pager, please call my cell phone at [phone].  *Please refer to [email or web address], password [email or web address] to get updated schedule on who will round on this patient, as hospitalists switch teams weekly. If 7PM-7AM, please contact night-coverage at [email or web address], password [email or web address] for any overnight needs.  03/03/2016, 7:58 AM

## 2016-03-03 NOTE — Progress Notes (Signed)
CSW still following patents for any needs. patient is not medically stable to discharge to SNF at this moment. CSW will remains available for support and discharge needs.   Rhea Pink, MSW,  [place]

## 2016-03-03 NOTE — Progress Notes (Signed)
Speech Language Pathology Treatment: Dysphagia  Patient Details Name: Victoria Fisher MRN: [id number] DOB: 1934-07-06 Today's Date: 03/03/2016 Time: [id number] SLP Time Calculation (min) (ACUTE ONLY): 20 min  Assessment / Plan / Recommendation Clinical Impression  Skilled treatment focused on coordinating POC with MD. I spoke with Hospitalist and then left message for Palliative Care physician regarding plan of care  With PO intake for pt. Pt consumed ice chips without overt s/s of aspiration however pt continues to be at high risk for aspiration given results of Modified Barium Swallow Study.    HPI HPI: Victoria B Burnettis a 82 y.o.femalewith history of diastolic CHF, COPD, OSA, colon cancer,hypertension, chronic lymphedema CAD chronic kidney disease A. fib presents to the ER because of shortness of breath and productive cough and frequent falls. Initial ER physician note the patient was confused for which CT head was negative for acute intracranial process. Per note pt admitted for bronchitis and lower extremity cellulitis. CXR Continued improvement in left lower lobe airspace disease. MBS revealed suspected primary esophageal dysphagia with barium filling esophagus and reluxing. NPO recommended. MD asked ST to see again for po's.      SLP Plan  Continue with current plan of care     Recommendations  Diet recommendations: NPO (Except for sips and chips) Liquids provided via: Teaspoon Medication Administration: Whole meds with liquid Supervision: Staff to assist with self feeding Compensations: Small sips/bites;Slow rate Postural Changes and/or Swallow Maneuvers: Seated upright 90 degrees                Oral Care Recommendations: Oral care QID Follow up Recommendations: Skilled Nursing facility Plan: Continue with current plan of care       GO             Victoria Fisher, M.S., CCC-SLP Speech-Language Pathologist    Zaccai Chavarin 03/03/2016, 2:27 PM

## 2016-03-03 NOTE — Progress Notes (Signed)
I spoke with her son [name] in detail about her condition. He is her HCPOA-he will fax documentation. He expressed concerns about his mother and her use of opiates and "manipulative behavior". Her mental status has been in question even prior to admission. She has been discharged from multiple home health companies due to "stubborn" behavior not wanting to accept help or care for her safety and her son found that she had been "hoarding opioids" at home so he hired people to administer her medications which upset her. Prior to admission she was in [place] at [employer] with private duty paid in home caregivers. Her husband is now at [facility] "rehabbing with advanced dementia" also not doing well-apparently [facility] may be able to take her with her husband.   Recommendations:  1. Resume a reasonable texture diet- meals with FULL supervision- OOB for meals when possible. Thins ok. No feeding tubes now or in future-goals are comfort and risks have been explained. Feeding tubes do not prevent aspiration and should not be offered.  2. Skilled Level Care -she is extremely deconditioned and will likely need high level of care.  3. I discussed opioids for her chronic pain and dyspnea- they should be given by a qualified medical professional-they should be scheduled and low dose only. Recommend for now Roxanol 10mg  TID and scheduled Tylenol.  Can transition to oral meds tomorrow.  Request palliative care follow at SNF.  Lane Hacker, DO Palliative Medicine [phone]

## 2016-03-03 NOTE — Progress Notes (Signed)
Paged Dr. Rockne Menghini regarding patients NPO/ice chips status. She put in an order for fluids. Fluids started. Paged Dr. Hilma Favors regarding her measures for care. Waiting to hear back.  Cyndia Bent RN

## 2016-03-03 NOTE — Consult Note (Addendum)
[place] Nurse wound consult note Reason for Consult: Consult requested to remove Una boots and assess bilat legs.  Una boots appear to have been ordered by a physician and were applied by ortho tech on 1/11, but there was never an order to have them removed or changed. Pt states she had a large amt edema and weeping to right leg when they were applied which has resolved at this time. Wound type: Left leg without edema, erythremia, open wounds, or drainage. Right leg without edema, erythremia, or drainage. Partial thickness wound to right posterior leg; 2X1X.1cm, pink and dry. Dressing procedure/placement/frequency: Ardelia Mems boots are no longer indicated at this time.  Foam dressing to protect right posterior leg and protect from further injury.  Discussed plan of care with patient and she verbalized understanding. Please re-consult if further assistance is needed.  Thank-you,  Julien Girt MSN, [place], [place], [place], [place]

## 2016-03-04 ENCOUNTER — Other Ambulatory Visit: Payer: Self-pay | Admitting: Licensed Clinical Social Worker

## 2016-03-04 DIAGNOSIS — I482 Chronic atrial fibrillation: Secondary | ICD-10-CM

## 2016-03-04 DIAGNOSIS — I10 Essential (primary) hypertension: Secondary | ICD-10-CM

## 2016-03-04 DIAGNOSIS — N183 Chronic kidney disease, stage 3 (moderate): Secondary | ICD-10-CM

## 2016-03-04 LAB — BASIC METABOLIC PANEL
Anion gap: 6 (ref 5–15)
BUN: 97 mg/dL — AB (ref 6–20)
CO2: 34 mmol/L — ABNORMAL HIGH (ref 22–32)
CREATININE: 1.17 mg/dL — AB (ref 0.44–1.00)
Calcium: 9.7 mg/dL (ref 8.9–10.3)
Chloride: 106 mmol/L (ref 101–111)
GFR, EST AFRICAN AMERICAN: 50 mL/min — AB (ref >60)
GFR, EST NON AFRICAN AMERICAN: 43 mL/min — AB (ref >60)
Glucose, Bld: 160 mg/dL — ABNORMAL HIGH (ref 65–99)
POTASSIUM: 3.9 mmol/L (ref 3.5–5.1)
SODIUM: 146 mmol/L — AB (ref 135–145)

## 2016-03-04 MED ORDER — LEVOTHYROXINE SODIUM 112 MCG PO TABS
112.0000 ug | ORAL_TABLET | Freq: Every day | ORAL | Status: DC
  Administered 2016-03-05: 112 ug via ORAL
  Filled 2016-03-04: qty 1

## 2016-03-04 NOTE — Patient Outreach (Addendum)
[facility]) Care Management  03/04/2016  LURAE LANGELIER 1934/07/12 [id number]   Assessment- CSW completed chart review. Patient is still not ready to be discharged from hospital to go to [facility]. Palliative Care Physician spoke to patient and patient's son yesterday. Note indicates "I spoke with her son [name] in detail about her condition. He is her HCPOA-he will fax documentation.He expressed concerns about his mother and her use of opiates and "manipulative behavior". Her mental status has been in question even prior to admission. She has been discharged from multiple home health companies due to "stubborn" behavior not wanting to accept help or care for her safety and her son found that she had been "hoarding opioids" at home so he hired people to administer her medications which upset her. Prior to admission she was in [place] at [facility] with private duty paid in home caregivers. Her husband is now at [facility] "rehabbing with advanced dementia" also not doing well-apparently [facility] may be able to take her with her husband." [facility] will be following patient while in SNF. CSW will discharge patient from caseload at this time as patient has been in the hospital for over 10 days.   Plan-CSW will complete [facility] case closure.   [name], BSW, MSW, [email or web address] Phone: [phone] Fax: [phone]

## 2016-03-04 NOTE — Progress Notes (Addendum)
Patient ID: Victoria Fisher, female   DOB: [birth year], 82 y.o.   MRN: [id number]  PROGRESS NOTE    Victoria Fisher  [id number] DOB: [birth year] DOA: 02/18/2016  PCP: Haywood Pao, MD   Brief Narrative:  82 y.o. female, SNF resident, past medical history of chronic diastolic CHF, cor pulmonale, HTN, chronic lymphedema, CAD, chronic kidney disease, A. fib, chronic pain syndrome, COPD, frequent falls, hypothyroidism and OSA who presented to ED on 02/19/16 with dyspnea and productive cough. She was initially admitted for acute bronchitis.She continued to decline from 02/25/16. CCM was consulted and patient was transferred to ICU under CCM care on 02/26/16. She was stabilized. Transferred back to the hospitalist service on 1/12. Noted to have oropharyngeal dysphagia with high aspiration risk. Palliative medicine was consulted for goals of care. She is on dysphagia 3 diet with aspiration precaution and pt declined feeding tube.   Assessment & Plan:  Acute hypoxic and hypercapnic respiratory failure - Multifactorial, likely due to Influenza with acute bronchitis, COPD, pulmonary edema and aspiration.  - On 1/9, patient was given a dose of Narcan with improvement in mental status.  - Noted increased work of breathing and hypoxia. BiPAP placed. CCM was consulted and patient was transferred to ICU on 02/26/16 for close monitoring.  - Due to ongoing wheezing, pt started on steroids, she is currently on solu-medrol 40 mg IV Q 8 hours - Continue meropenem for now (compelte total 7 days) - Continue duoneb TID scheduled   Aspiration pneumonia - On meropenem - Pt declined feeding tube - On dysphagia 3 diet  Acute encephalopathy / Possible dementia  - Secondary to Ativan. Avoid sedative drugs.  Hypernatremia - Likely from poor PO intake - Sodium 147 --> 146  Oropharyngeal dysphagia with high aspiration risk - Patient was seen by speech pathology. She underwent swallow evaluation which  revealed high aspiration risk. Reason for her dysphagia is not entirely clear.  - Recent CT scan of the head did not show any acute findings.  - Pt declined feeding tubes - On dysphagia 3 diet   Acute on chronic diastolic CHF - Patient was given intravenous Lasix during this hospitalization.  - She had worsening of her renal function so diuretics were held.  - Echocardiogram report from October 2017 with normal EF, severe tricuspid regurgitation.  - Stable respiratory status   A. Fib, chronic  - CHADS vasc score 4 - Rate controlled with metoprolol 2.5 mg IV Q 6 hours  - Not a candidate for AC due to recurrent falls  Essential hypertension - Continue IV metoprolol   Acute on Stage III chronic kidney disease with hypokalemia - Cr as high as 2.06 - Diuretics held - Cr now 1.17  Thrombocytopenia - Improved   Influenza A - Completed 5 days of Tamiflu on 1/7.  Hypothyroidism - Continue IV Synthroid while nothing by mouth.  Recurrent falls/recent fall - Left scalp hematoma by CT - Placement to SNF  Chronic lymphedema/? [place] disease - Removed una boots, per WOC no longer indicated  - No significant swelling in LE  Chronic pain - Long-acting morphine held due to respiratory depression - No PO meds due to aspiration risk  Right leg wound - Management per [facility]OC   DVT prophylaxis: SCD's bilaterally  Code Status: full code  Family Communication: no family at the bedside this am Disposition Plan: obtain PT eval for safe discharge plan   Consultants:   Palliative care   [place]  CCM  SLP  Procedures:   None   Antimicrobials:   Ceftin 1/5-1/6  Clindamycin 1/4-1/9  Doxycycline 1/2-1/3  Tamiflu-completed 5 day course  Meropenem 1/10 >  Vancomycin 1/10 >1/14   Subjective: No overnight events.  Objective: Vitals:   03/03/16 1524 03/03/16 2148 03/04/16 0642 03/04/16 1300  BP: (!) 142/76 129/74 120/71 119/68  Pulse: 93 87 79 88  Resp:   20  20  Temp:  98.1 F (36.7 C) 97.7 F (36.5 C) 98.6 F (37 C)  TempSrc:   Oral Oral  SpO2: 97% 98% 92% 99%  Weight:      Height:        Intake/Output Summary (Last 24 hours) at 03/04/16 1455 Last data filed at 03/04/16 1300  Gross per 24 hour  Intake           878.33 ml  Output                0 ml  Net           878.33 ml   Filed Weights   02/23/16 0500 02/25/16 0500 02/25/16 1516  Weight: 71.8 kg (158 lb 4.8 oz) 71.2 kg (157 lb) 68.7 kg (151 lb 7.3 oz)    Examination:  General exam: Appears calm and comfortable  Respiratory system: Crackles at bases, no wheezing  Cardiovascular system: S1 & S2 heard, Rate controlled  Gastrointestinal system: Abdomen is nondistended, soft and nontender. No organomegaly or masses felt. Normal bowel sounds heard. Central nervous system: Alert and oriented. No focal neurological deficits. Extremities: Symmetric, wound to right posterior leg Skin: No rashes, lesions or ulcers Psychiatry: Judgement and insight appear normal. Mood & affect appropriate.   Data Reviewed: I have personally reviewed following labs and imaging studies  CBC:  Recent Labs Lab 02/27/16 0246 02/28/16 0554  WBC 9.2 10.4  HGB 13.5 14.0  HCT 43.3 43.7  MCV 103.6* 101.2*  PLT 108* [id number]*   Basic Metabolic Panel:  Recent Labs Lab 02/27/16 0246 02/28/16 0258 02/29/16 0300 03/02/16 0214 03/04/16 0258  NA 140 145 144 147* 146*  K 4.1 4.5 3.3* 3.9 3.9  CL 93* 98* 98* 97* 106  CO2 33* 28 31 34* 34*  GLUCOSE 97 71 90 110* 160*  BUN 94* 93* 87* 84* 97*  CREATININE 2.15* 1.92* 1.65* 1.37* 1.17*  CALCIUM 9.7 9.7 9.9 10.4* 9.7   GFR: Estimated Creatinine Clearance: 37.1 mL/min (by C-G formula based on SCr of 1.17 mg/dL (H)). Liver Function Tests: No results for input(s): AST, ALT, ALKPHOS, BILITOT, PROT, ALBUMIN in the last 168 hours. No results for input(s): LIPASE, AMYLASE in the last 168 hours. No results for input(s): AMMONIA in the last 168  hours. Coagulation Profile: No results for input(s): INR, PROTIME in the last 168 hours. Cardiac Enzymes: No results for input(s): CKTOTAL, CKMB, CKMBINDEX, TROPONINI in the last 168 hours. BNP (last 3 results) No results for input(s): PROBNP in the last 8760 hours. HbA1C: No results for input(s): HGBA1C in the last 72 hours. CBG: No results for input(s): GLUCAP in the last 168 hours. Lipid Profile: No results for input(s): CHOL, HDL, LDLCALC, TRIG, CHOLHDL, LDLDIRECT in the last 72 hours. Thyroid Function Tests: No results for input(s): TSH, T4TOTAL, FREET4, T3FREE, THYROIDAB in the last 72 hours. Anemia Panel: No results for input(s): VITAMINB12, FOLATE, FERRITIN, TIBC, IRON, RETICCTPCT in the last 72 hours. Urine analysis:    Component Value Date/Time   COLORURINE YELLOW 02/18/2016 2358   APPEARANCEUR CLEAR 02/18/2016 2358   LABSPEC  1.010 02/18/2016 2358   PHURINE 5.0 02/18/2016 2358   GLUCOSEU NEGATIVE 02/18/2016 2358   HGBUR NEGATIVE 02/18/2016 2358   BILIRUBINUR NEGATIVE 02/18/2016 2358   KETONESUR NEGATIVE 02/18/2016 2358   PROTEINUR NEGATIVE 02/18/2016 2358   UROBILINOGEN 0.2 11/18/2014 0600   NITRITE NEGATIVE 02/18/2016 2358   LEUKOCYTESUR NEGATIVE 02/18/2016 2358   Sepsis Labs: @LABRCNTIP (procalcitonin:4,lacticidven:4)    Recent Results (from the past 240 hour(s))  MRSA PCR Screening     Status: Abnormal   Collection Time: 02/25/16  3:42 PM  Result Value Ref Range Status   MRSA by PCR POSITIVE (A) NEGATIVE Final      Radiology Studies: No results found.   Scheduled Meds: . acetaminophen (TYLENOL) oral liquid 160 mg/5 mL  650 mg Oral TID  . feeding supplement (ENSURE ENLIVE)  237 mL Oral BID BM  . ipratropium-albuterol  3 mL Nebulization TID  . [START ON 03/05/2016] levothyroxine  112 mcg Oral QAC breakfast  . mouth rinse  15 mL Mouth Rinse BID  . meropenem (MERREM) IV  1 g Intravenous Q12H  . methylPREDNISolone (SOLU-MEDROL) injection  40 mg  Intravenous Q8H  . metoprolol  2.5 mg Intravenous Q6H  . morphine CONCENTRATE  10 mg Oral TID   Continuous Infusions:   LOS: 14 days    Time spent: 25 minutes  Greater than 50% of the time spent on counseling and coordinating the care.   Leisa Lenz, MD [facility] Pager [phone]  If 7PM-7AM, please contact night-coverage [email or web address] Password [email or web address] 03/04/2016, 2:55 PM

## 2016-03-05 ENCOUNTER — Other Ambulatory Visit: Payer: Self-pay | Admitting: Pharmacist

## 2016-03-05 ENCOUNTER — Encounter: Payer: Self-pay | Admitting: Licensed Clinical Social Worker

## 2016-03-05 LAB — CBC
HCT: 35.6 % — ABNORMAL LOW (ref 36.0–46.0)
HEMOGLOBIN: 10.6 g/dL — AB (ref 12.0–15.0)
MCH: 31.9 pg (ref 26.0–34.0)
MCHC: 29.8 g/dL — AB (ref 30.0–36.0)
MCV: 107.2 fL — AB (ref 78.0–100.0)
Platelets: 99 10*3/uL — ABNORMAL LOW (ref 150–400)
RBC: 3.32 MIL/uL — AB (ref 3.87–5.11)
RDW: 16.2 % — ABNORMAL HIGH (ref 11.5–15.5)
WBC: 7.9 10*3/uL (ref 4.0–10.5)

## 2016-03-05 LAB — BASIC METABOLIC PANEL
Anion gap: 7 (ref 5–15)
BUN: 84 mg/dL — AB (ref 6–20)
CO2: 35 mmol/L — ABNORMAL HIGH (ref 22–32)
CREATININE: 0.95 mg/dL (ref 0.44–1.00)
Calcium: 9.9 mg/dL (ref 8.9–10.3)
Chloride: 104 mmol/L (ref 101–111)
GFR calc Af Amer: >60 mL/min (ref >60)
GFR calc non Af Amer: 55 mL/min — ABNORMAL LOW (ref >60)
GLUCOSE: 122 mg/dL — AB (ref 65–99)
Potassium: 4.5 mmol/L (ref 3.5–5.1)
SODIUM: 146 mmol/L — AB (ref 135–145)

## 2016-03-05 MED ORDER — IPRATROPIUM-ALBUTEROL 0.5-2.5 (3) MG/3ML IN SOLN
3.0000 mL | Freq: Two times a day (BID) | RESPIRATORY_TRACT | Status: DC
  Administered 2016-03-05 – 2016-03-06 (×2): 3 mL via RESPIRATORY_TRACT
  Filled 2016-03-05 (×2): qty 3

## 2016-03-05 MED ORDER — PREDNISONE 50 MG PO TABS: 50.0000 mg | ORAL_TABLET | Freq: Every day | ORAL | 0 refills | Status: DC

## 2016-03-05 MED ORDER — LEVOFLOXACIN 500 MG PO TABS: 500.0000 mg | ORAL_TABLET | Freq: Every day | ORAL | 0 refills | Status: DC

## 2016-03-05 MED ORDER — METOPROLOL SUCCINATE ER 50 MG PO TB24
50.0000 mg | ORAL_TABLET | Freq: Every day | ORAL | Status: DC
  Administered 2016-03-06: 50 mg via ORAL
  Filled 2016-03-05: qty 1

## 2016-03-05 MED ORDER — IPRATROPIUM-ALBUTEROL 0.5-2.5 (3) MG/3ML IN SOLN: 3.0000 mL | Freq: Three times a day (TID) | RESPIRATORY_TRACT | 0 refills | Status: DC

## 2016-03-05 MED ORDER — MORPHINE SULFATE 15 MG PO TABS: 15.0000 mg | ORAL_TABLET | Freq: Three times a day (TID) | ORAL | 0 refills | Status: DC | PRN

## 2016-03-05 MED ORDER — PREDNISONE 20 MG PO TABS
50.0000 mg | ORAL_TABLET | Freq: Every day | ORAL | Status: DC
  Administered 2016-03-06: 50 mg via ORAL
  Filled 2016-03-05: qty 2

## 2016-03-05 MED ORDER — IPRATROPIUM-ALBUTEROL 0.5-2.5 (3) MG/3ML IN SOLN: 3.0000 mL | RESPIRATORY_TRACT | 0 refills | Status: AC | PRN

## 2016-03-05 MED ORDER — LEVOTHYROXINE SODIUM 112 MCG PO TABS
112.0000 ug | ORAL_TABLET | Freq: Every day | ORAL | Status: DC
  Administered 2016-03-06: 112 ug via ORAL
  Filled 2016-03-05: qty 1

## 2016-03-05 MED ORDER — MORPHINE SULFATE (CONCENTRATE) 10 MG/0.5ML PO SOLN: 10.0000 mg | Freq: Three times a day (TID) | ORAL | 0 refills | Status: DC

## 2016-03-05 MED ORDER — ACETAMINOPHEN 160 MG/5ML PO SOLN: 650.0000 mg | Freq: Three times a day (TID) | ORAL | 0 refills | Status: DC

## 2016-03-05 MED ORDER — ENSURE ENLIVE PO LIQD: 237.0000 mL | Freq: Two times a day (BID) | ORAL | 12 refills | Status: DC

## 2016-03-05 NOTE — Progress Notes (Addendum)
LCSW remains to follow for disposition.  Patient will plan to DC to SNF: [facility] once medically stable. LCSW spoke with Ivin Booty at [place] with regards to plan and she is agreeable.  Will continue to follow and update clinicals once patient is ready.  (Call placed to [facility] to see if beds available as patient may be able to transport to SNF today, facility is going to follow up with LCSW if female bed is available and will need someone to sign patient into facility to treat.  RN is aware and CM aware of barriers and plans pending).  Lane Hacker, MSW Clinical Social Work: [employer] Coverage for :  [phone]

## 2016-03-05 NOTE — Patient Outreach (Signed)
[facility]) Care Management  03/05/2016  Victoria Fisher 1934/04/30 [id number]  Patient had been referred to [place] for medication education.  Patient has been admitted for over 10 days and per chart review, patient may be discharged to SNF.    Plan:  Will close [place] case given patient still admitted and potential discharge to SNF.    Karrie Meres, PharmD, [place] [phone]

## 2016-03-05 NOTE — Discharge Summary (Signed)
Physician Discharge Summary  Victoria Fisher [id number] DOB: Dec 08, 1934 DOA: 02/18/2016  PCP: Haywood Pao, MD  Admit date: 02/18/2016 Discharge date: 03/06/2016  Recommendations for Outpatient Follow-up:  Continue Levaquin for 5 days on discharge. Continue prednisone for 5 more days on discharge and then stop. Please continue to have palliative care services follow in SNF.  Discharge Diagnoses:  Principal Problem:   Acute respiratory failure with hypoxia (HCC) Active Problems:   Essential hypertension   COPD (chronic obstructive pulmonary disease) (HCC)   Pulmonary hypertension   CKD (chronic kidney disease), stage III   Coronary artery disease   Chronic cor pulmonale (HCC)   Atrial fibrillation, chronic (HCC)   Obstructive sleep apnea   Cellulitis of lower extremity   Acute bronchitis   Bronchitis   Pressure injury of skin   Aspiration pneumonia (HCC)   Goals of care, counseling/discussion   Palliative care by specialist    Discharge Condition: stable, dysphagia 3   Diet recommendation: as tolerated   History of present illness:  82 y.o.female, SNF resident, past medical history of chronic diastolic CHF, cor pulmonale, HTN, chronic lymphedema, CAD, chronic kidney disease, A. fib, chronic pain syndrome, COPD, frequent falls, hypothyroidism and OSA who presented to ED on 02/19/16 with dyspnea and productive cough. She was initially admitted for acute bronchitis.She continued to decline from 02/25/16. CCM was consulted and patient was transferred to ICU under CCM care on 02/26/16. She was stabilized. Transferred back to the hospitalist service on 1/12. Noted to have oropharyngeal dysphagia with high aspiration risk. Palliative medicine was consulted for goals of care. She is on dysphagia 3 diet with aspiration precaution and pt declined feeding tube.  Hospital Course:   Acute hypoxic and hypercapnic respiratory failure - Multifactorial, likely due to Influenza with acute  bronchitis, COPD, pulmonary edema and aspiration.  - On 1/9, patient was given a dose of Narcan with improvement in mental status.  - Noted increased work of breathing and hypoxia. BiPAP placed. CCM was consulted and patient was transferred to ICU on 02/26/16 for close monitoring.  - Due to ongoing wheezing, pt started on steroids, she is currently on solu-medrol 40 mg IV Q 8 hours; change to PO prednisone for 5 days 50 mg a day  - Continue meropenem through tomorrow, would be day 9 - Change to Levaquin on discharge for 5 more days so her total tx is 14 days   Aspiration pneumonia - On meropenem - Pt declined feeding tube - On dysphagia 3 diet  Acute encephalopathy / Possible dementia  - Secondary to Ativan. Avoid sedative drugs.  Hypernatremia - Likely from poor PO intake - Sodium 147 --> 146  Oropharyngeal dysphagia with high aspiration risk - Patient was seen by speech pathology. She underwent swallow evaluation which revealed high aspiration risk. Reason for her dysphagia is not entirely clear.  - Recent CT scan of the head did not show any acute findings.  - Pt declined feeding tubes - On dysphagia 3 diet   Acute on chronic diastolic CHF - Patient was given intravenous Lasix during this hospitalization.  - She had worsening of her renal function so diuretics were held.  - Echocardiogram report from October 2017 with normal EF, severe tricuspid regurgitation. - Stable respiratory status   A. Fib, chronic  - CHADS vasc score 4 - Rate controlled with metoprolol  - Not a candidate for AC due to recurrent falls  Essential hypertension - Continue metoprolol   Acute on Stage III chronic  kidney disease with hypokalemia - Cr as high as 2.06 - Diuretics held - Cr WNL - Please reassess in SNF if diuretic needs to be resumed - At this point no LE swelling so maybe ok to continue to hold it until we repeat BMP and make sure Cr remains stable   Thrombocytopenia -  Improved   Influenza A - Completed 5 days of Tamiflu on 1/7.  Hypothyroidism - Continue synthroid   Recurrent falls/recent fall - Left scalp hematoma by CT - Placement to SNF  Chronic lymphedema/? Milroy disease - Removed una boots, per WOC no longer indicated  - No significant swelling in LE  Chronic pain - Long-acting morphine held due to respiratory depression - Mental status better this am  Right leg wound - Management per WOC   DVT prophylaxis: SCD's bilaterally  Code Status: full code  Family Communication: no family at the bedside this am    Consultants:   Palliative care   WOC  CCM  SLP  Procedures:   None   Antimicrobials:   Ceftin 1/5-1/6  Clindamycin 1/4-1/9  Doxycycline 1/2-1/3  Tamiflu-completed 5 day course  Meropenem 1/10 > 1/19  Vancomycin 1/10 >1/14    Signed:  Leisa Lenz, MD  [facility] 03/05/2016, 12:33 PM  Pager #: [phone]  Time spent in minutes: less than 30 minutes   Discharge Exam: Vitals:   03/05/16 0415 03/05/16 1200  BP: 124/70 127/68  Pulse: 82 83  Resp: 20   Temp: 97.5 F (36.4 C)    Vitals:   03/04/16 2049 03/05/16 0217 03/05/16 0415 03/05/16 1200  BP:  127/74 124/70 127/68  Pulse:  89 82 83  Resp:   20   Temp:   97.5 F (36.4 C)   TempSrc:   Oral   SpO2: 97%  98%   Weight:      Height:        General: Pt is alert, follows commands appropriately, not in acute distress Cardiovascular: Regular rate and rhythm, S1/S2 + Respiratory: Diminished bilaterally, no wheezing Abdominal: Soft, non tender, non distended, bowel sounds +, no guarding Extremities: no cyanosis, pulses palpable bilaterally DP and PT Neuro: Grossly nonfocal  Discharge Instructions  Discharge Instructions    Call MD for:  persistant nausea and vomiting    Complete by:  As directed    Call MD for:  redness, tenderness, or signs of infection (pain, swelling, redness, odor or green/yellow discharge  around incision site)    Complete by:  As directed    Call MD for:  severe uncontrolled pain    Complete by:  As directed    Diet - low sodium heart healthy    Complete by:  As directed    Discharge instructions    Complete by:  As directed    Continue Levaquin for 5 days on discharge. Continue prednisone for 5 more days on discharge and then stop.   Increase activity slowly    Complete by:  As directed      Allergies as of 03/05/2016      Reactions   Codeine Anaphylaxis, Hives, Nausea And Vomiting   Gabapentin Swelling, Other (See Comments)   Hallucinations, severe facial swelling   Other Other (See Comments)   Will not accept blood or blood products - patient is Jehovah's Witness   Ciprofloxacin Hives, Nausea And Vomiting   Latex Hives, Rash   Penicillins Hives   Has patient had a PCN reaction causing immediate rash, facial/tongue/throat swelling, SOB or  lightheadedness with hypotension: Yes Has patient had a PCN reaction causing severe rash involving mucus membranes or skin necrosis: Yes Has patient had a PCN reaction that required hospitalization Yes Has patient had a PCN reaction occurring within the last 10 years: Yes If all of the above answers are "NO", then may proceed with Cephalosporin use.   Sulfonamide Derivatives Hives, Rash      Medication List    STOP taking these medications   aspirin EC 81 MG tablet   atorvastatin 40 MG tablet Commonly known as:  LIPITOR   calcitRIOL 0.25 MCG capsule Commonly known as:  ROCALTROL   Darbepoetin Alfa 100 MCG/0.5ML Sosy injection Commonly known as:  ARANESP   ferrous sulfate 325 (65 FE) MG tablet   levalbuterol 45 MCG/ACT inhaler Commonly known as:  XOPENEX HFA   magnesium oxide 400 (241.3 Mg) MG tablet Commonly known as:  MAG-OX   metolazone 5 MG tablet Commonly known as:  ZAROXOLYN   morphine 30 MG 12 hr tablet Commonly known as:  MS CONTIN Replaced by:  morphine CONCENTRATE 10 MG/0.5ML Soln concentrated  solution You also have another medication with the same name that you need to continue taking as instructed.   nitroGLYCERIN 0.4 MG SL tablet Commonly known as:  NITROSTAT   potassium chloride SA 20 MEQ tablet Commonly known as:  K-DUR,KLOR-CON   sertraline 100 MG tablet Commonly known as:  ZOLOFT   torsemide 100 MG tablet Commonly known as:  DEMADEX   Vitamin D 2000 units Caps     TAKE these medications   acetaminophen 160 MG/5ML solution Commonly known as:  TYLENOL Take 20.3 mLs (650 mg total) by mouth 3 (three) times daily.   docusate sodium 100 MG capsule Commonly known as:  COLACE Take 100 mg by mouth 2 (two) times daily.   feeding supplement (ENSURE ENLIVE) Liqd Take 237 mLs by mouth 2 (two) times daily between meals.   ipratropium-albuterol 0.5-2.5 (3) MG/3ML Soln Commonly known as:  DUONEB Take 3 mLs by nebulization every 2 (two) hours as needed.   ipratropium-albuterol 0.5-2.5 (3) MG/3ML Soln Commonly known as:  DUONEB Take 3 mLs by nebulization 3 (three) times daily.   levofloxacin 500 MG tablet Commonly known as:  LEVAQUIN Take 1 tablet (500 mg total) by mouth daily.   levothyroxine 112 MCG tablet Commonly known as:  SYNTHROID, LEVOTHROID Take 1 tablet (112 mcg total) by mouth every morning.   metoprolol succinate 50 MG 24 hr tablet Commonly known as:  TOPROL-XL Take 50 mg by mouth daily. Take with or immediately following a meal.   mometasone 50 MCG/ACT nasal spray Commonly known as:  NASONEX Place 2 sprays into the nose daily as needed (congestion).   morphine CONCENTRATE 10 MG/0.5ML Soln concentrated solution Take 0.5 mLs (10 mg total) by mouth 3 (three) times daily. What changed:  You were already taking a medication with the same name, and this prescription was added. Make sure you understand how and when to take each. Replaces:  morphine 30 MG 12 hr tablet   morphine 15 MG tablet Commonly known as:  MSIR Take 1 tablet (15 mg total) by  mouth 3 (three) times daily as needed (breakthrough pain). What changed:  Another medication with the same name was added. Make sure you understand how and when to take each.  Another medication with the same name was removed. Continue taking this medication, and follow the directions you see here.   polyethylene glycol packet Commonly known as:  MIRALAX / GLYCOLAX Take 17 g by mouth 2 (two) times daily as needed (constipation). Mix in 8 oz liquid and drink   predniSONE 50 MG tablet Commonly known as:  DELTASONE Take 1 tablet (50 mg total) by mouth daily with breakfast.   PROBIOTIC PO Take 1 tablet by mouth daily.   senna 8.6 MG Tabs tablet Commonly known as:  SENOKOT Take 2-3 tablets by mouth daily as needed for mild constipation.   vitamin E 400 UNIT capsule Take 400 Units by mouth daily.       Follow-up Information    Haywood Pao, MD. Schedule an appointment as soon as possible for a visit in 1 week(s).   Specialty:  Internal Medicine Contact information: [place] [address] [phone]            The results of significant diagnostics from this hospitalization (including imaging, microbiology, ancillary and laboratory) are listed below for reference.    Significant Diagnostic Studies: Dg Chest 2 View  Result Date: 02/23/2016 CLINICAL DATA:  Left lower lobe pneumonia. EXAM: CHEST  2 VIEW COMPARISON:  PA and lateral chest 02/21/2014 and 02/19/2014. FINDINGS: Left lower lobe airspace disease continues to improve. There is marked cardiomegaly without edema. Right lung is clear. No pneumothorax or pleural fluid. Aortic atherosclerosis noted. IMPRESSION: Continued improvement in left lower lobe airspace disease. Cardiomegaly without edema. Atherosclerosis. Electronically Signed   By: Inge Rise M.D.   On: 02/23/2016 12:40   Dg Chest 2 View  Result Date: 02/22/2016 CLINICAL DATA:  Bronchitis and volume overload. EXAM: CHEST  2 VIEW  COMPARISON:  February 20, 2016 FINDINGS: The left retrocardiac opacity seen previously has improved. No overt edema. Stable cardiomegaly. IMPRESSION: Improving left retrocardiac opacity.  No overt edema. Electronically Signed   By: Dorise Bullion III M.D   On: 02/22/2016 17:31   Dg Chest 2 View  Result Date: 02/20/2016 CLINICAL DATA:  82 year old female with history of pneumonia. Followup study. EXAM: CHEST  2 VIEW COMPARISON:  Chest x-ray 02/18/2016. FINDINGS: Lung volumes are normal. Retrocardiac opacity obscuring portions of the left hemidiaphragm, concerning for left lower lobe pneumonia. Possible trace left pleural effusion. Right lung is clear. No pneumothorax. Cephalization of the pulmonary vasculature, without frank pulmonary edema. Mild cardiomegaly. The patient is rotated to the left on today's exam, resulting in distortion of the mediastinal contours and reduced diagnostic sensitivity and specificity for mediastinal pathology. Aortic atherosclerosis. IMPRESSION: 1. Findings are concerning for left lower lobe pneumonia, likely with trace left pleural effusion. Followup PA and lateral chest X-ray is recommended in 3-4 weeks following trial of antibiotic therapy to ensure resolution and exclude underlying malignancy. 2. Cardiomegaly with pulmonary venous congestion, but no frank pulmonary edema. 3. Aortic atherosclerosis. Electronically Signed   By: Vinnie Langton M.D.   On: 02/20/2016 13:50   Dg Chest 2 View  Result Date: 02/18/2016 CLINICAL DATA:  Fever and flu like symptoms EXAM: CHEST  2 VIEW COMPARISON:  02/05/2016 FINDINGS: Moderate cardiomegaly with globular cardiac configuration as before. Atherosclerosis of the aorta. No acute consolidation or effusion. No pneumothorax. IMPRESSION: 1. Moderate globular enlargement of the cardiac silhouette without overt failure 2. No acute infiltrates Electronically Signed   By: Donavan Foil M.D.   On: 02/18/2016 22:56   Dg Chest 2 View  Result Date:  02/05/2016 CLINICAL DATA:  Bilateral lower extremity swelling. Shortness breath. Increased falls. Personal history of congestive heart failure. EXAM: CHEST  2 VIEW COMPARISON:  Two-view chest x-ray 01/31/2016  FINDINGS: Heart is enlarged without significant interval change. Atherosclerotic calcifications are again noted at the aortic arch. There is no edema or effusion to suggest failure. Focal airspace disease is present. The visualized soft tissues and bony thorax are unremarkable. IMPRESSION: 1. Stable cardiomegaly without failure. 2. No acute cardiopulmonary disease. 3. Aortic atherosclerosis. Electronically Signed   By: San Morelle M.D.   On: 02/05/2016 17:54   Ct Head Wo Contrast  Result Date: 02/19/2016 CLINICAL DATA:  Wheezing, fever. History of hypertension, frequent falls, colon cancer. EXAM: CT HEAD WITHOUT CONTRAST TECHNIQUE: Contiguous axial images were obtained from the base of the skull through the vertex without intravenous contrast. COMPARISON:  CT HEAD January 31, 2016 FINDINGS: BRAIN: The ventricles and sulci are normal for age. No intraparenchymal hemorrhage, mass effect nor midline shift. Patchy supratentorial white matter hypodensities less than expected for patient's age, though non-specific are most compatible with chronic small vessel ischemic disease. No acute large vascular territory infarcts. No abnormal extra-axial fluid collections. Basal cisterns are patent. VASCULAR: Mild to moderate calcific atherosclerosis of the carotid siphons. SKULL: No skull fracture. Moderate LEFT frontoparietal scalp hematomas. No subcutaneous gas or radiopaque foreign bodies. LEFT facial and periorbital soft tissue swelling compatible with contusion. SINUSES/ORBITS: Mild paranasal sinus mucosal thickening. Mastoid air cells are well aerated. Status post bilateral ocular lens implants. The included ocular globes and orbital contents are non-suspicious. OTHER: None. IMPRESSION: Multiple scalp  hematomas and LEFT facial contusion. No skull fracture. No acute intracranial process ; negative stable examination for age. Electronically Signed   By: Elon Alas M.D.   On: 02/19/2016 00:46   Dg Pelvis Portable  Result Date: 02/19/2016 CLINICAL DATA:  RIGHT leg pain, recent fall EXAM: PORTABLE PELVIS 1-2 VIEWS COMPARISON:  Portable exam 1008 hours compared to 11/18/2014 FINDINGS: Rotated to the LEFT. Osseous demineralization. Hip joints symmetric and preserved. No definite fracture, dislocation, or bone destruction. IMPRESSION: Osseous demineralization without acute bony abnormalities identified on rotated exam. Electronically Signed   By: Lavonia Dana M.D.   On: 02/19/2016 10:21   Dg Chest Portable 1 View  Result Date: 02/27/2016 CLINICAL DATA:  82 year old female with shortness of Breath. COPD, cor pulmonale. Initial encounter. EXAM: PORTABLE CHEST 1 VIEW COMPARISON:  02/26/2016 and earlier. FINDINGS: Portable AP semi upright view at 0423 hours. The patient remains rotated to the left. Stable cardiomegaly and mediastinal contours. Calcified aortic atherosclerosis. Interval decreased pulmonary vascularity. No acute edema. Regressed platelike perihilar opacity. No pneumothorax. No definite pleural effusion. No areas of worsening ventilation. IMPRESSION: 1. Interval improved ventilation with regressed pulmonary vascular congestion and perihilar atelectasis. 2. Stable cardiomegaly.  Calcified aortic atherosclerosis. Electronically Signed   By: Genevie Ann M.D.   On: 02/27/2016 06:58   Dg Chest Port 1 View  Result Date: 02/26/2016 CLINICAL DATA:  Shortness of breath and respiratory distress EXAM: PORTABLE CHEST 1 VIEW COMPARISON:  Chest radiograph 02/25/2016 FINDINGS: Cardiomegaly can't aortic atherosclerosis are unchanged. Interstitial opacities in the right lung have worsened. Suspected lingular atelectasis has increased. No pneumothorax or sizable pleural effusion. IMPRESSION: Massive cardiomegaly,  aortic atherosclerosis and slightly worsening pulmonary edema. Electronically Signed   By: Ulyses Jarred M.D.   On: 02/26/2016 03:30   Dg Chest Port 1 View  Result Date: 02/25/2016 CLINICAL DATA:  Asthma-COPD. Respiratory distress. History of atrial fibrillation, CHF, pulmonary hypertension, colonic malignancy. EXAM: PORTABLE CHEST 1 VIEW COMPARISON:  Chest x-ray of February 23, 2016 FINDINGS: The lungs are adequately inflated. The interstitial markings on the right are more  conspicuous today. The cardiac silhouette remains enlarged. The pulmonary vascularity is not engorged. A small right pleural effusion has developed. There is calcification in the wall of the aortic arch. The observed bony thorax is unremarkable. IMPRESSION: Patchy increased interstitial density on the right may reflect early interstitial pneumonia or asymmetric pulmonary edema new small right pleural effusion. Stable cardiomegaly. Thoracic aortic atherosclerosis. Electronically Signed   By: David  Martinique M.D.   On: 02/25/2016 07:04   Dg Swallowing Func-speech Pathology  Result Date: 02/25/2016 Objective Swallowing Evaluation: Type of Study: MBS-Modified Barium Swallow Study Patient Details Name: Victoria Fisher MRN: [id number] Date of Birth: 1934-04-06 Today's Date: 02/25/2016 Time: SLP Start Time (ACUTE ONLY): 1350-SLP Stop Time (ACUTE ONLY): 1411 SLP Time Calculation (min) (ACUTE ONLY): 21 min Past Medical History: Past Medical History: Diagnosis Date . Abnormality of gait 01/30/2015 . Anemia in CKD (chronic kidney disease)  . Anxiety  . Arthritis  . Asthma   On nebulizer . Atrial fibrillation, chronic ([place])   a. Rate controlled w/ BB. No OAC 2/2 falls and gait instability. . Bowel obstruction   Caused by scar tissue . Chronic cor pulmonale (HCC)   Most likely due to COPD (cannot rule out contribution of diastolic LV dysfunction). ECHO 07/30/11 pulmonary artery pressure was estimated to be 50-60% mmHg & there was moderate tricuspid  insufficiency. . Chronic diastolic CHF (congestive heart failure) ([place])   a. Predominantly right heart failure due to cor pulmonale & in turn this is most likely due to untreated OSA;  b. 05/2013 Echo: EF 55-60%, no rwma, mild to mod MR, sev dil LA, mod to sev dil RV w/ reduced systolic fxn, massively dil RA, mod-sev TR, PASP 37mmHg. [name] Chronic pain syndrome  . Colon cancer ([place])   Carcinoma of sigmoid colon 1984 & 1985. [name] COPD (chronic obstructive pulmonary disease) ([place])  . Coronary artery disease   a. occluded proximal diagonal artery on angiography 2004 . Depression  . Diverticulosis of colon  . 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 Dyspnea on exertion   Reluctant to take diuretics . Edema of both legs   Venous Doppler 05/11/12 was normal & no evidence of thrombus or thrombophlebitis. Chronic LE edema related to both cor pulmonale & lymphedema, hyperlipidemia & HTN involving coronary atherosclerosis by cardiac cath in 2004. Cath showed a tiny old occlusion of the optional diagonal branch & also proximal LAD. [name] Frequent falls   Possibly due to orthostatic hypotension but we have not seen that on our visits. Recent fall 05/2012 with scalp hematoma, swollen left LE, & black eyes. . Hyperlipidemia   On a statin. Catheterization 02/12/03 Showed a tiny old occlusion of the optional diagonal branch & also proximal LAD. [name] Hypertension  . Hypothyroidism 2002  On hormone replacement therapy . IBS (irritable bowel syndrome)  . Insomnia  . Kidney failure 09/05/2015  Pt states that she went to the doctor on thursday and was told she is in stage 4 kidney failure . Lung nodule  . Milroy's disease   Lymphatic disorder diagnosed at [facility] . Obstructive sleep apnea   Polysomnogram 09/25/11 AHI: 53.6/hr overall & 57.1/hr during REM sleep. Poor compliance previously with CPAP . Pulmonary hypertension   Pullmonary hypertension, chronic cor pulmonale w/pulmonary artery pressures of 50&#x2011; 60 mmHg. . Tuberculosis  Past Surgical History: Past  Surgical History: Procedure Laterality Date . ABDOMINAL HYSTERECTOMY  1983 . APPENDECTOMY  1956 . CARDIAC CATHETERIZATION  02/12/03  Showed a tiny old occlusion of the optional diagonal branch &  also proximal LAD. [name] CHOLECYSTECTOMY  1984 . COLON BIOPSY   . COLON RESECTION    2 times . COLONOSCOPY  07/23/10 HPI: Victoria Fisher a 82 y.o.femalewith history of diastolic CHF, COPD, OSA, colon cancer,hypertension, chronic lymphedema CAD chronic kidney disease A. fib presents to the ER because of shortness of breath and productive cough and frequent falls. Initial ER physician note the patient was confused for which CT head was negative for acute intracranial process. Per note pt admitted for bronchitis and lower extremity cellulitis. CXR Continued improvement in left lower lobe airspace disease. MD ordered ST eval due to desat to 70's on 02 and baseline cough (cxr ordered 1/7). No Data Recorded Assessment / Plan / Recommendation CHL IP CLINICAL IMPRESSIONS 02/25/2016 Therapy Diagnosis Suspected primary esophageal dysphagia;Mild pharyngeal phase dysphagia Clinical Impression Pt exhibited a suspected primary esophageal dysphagia marked byf esophageal column full of barium after 3 trials thin and puree. Esophageal residue reached the level of the patients cervical esophagus with questionable entrace to pyriform sinuses. Suspect pt will or has been penetrating/aspirating post prandial especially due to pt's suboptimal trunk and head positioning. Decreased pharyngeal sensation due to swallow initiation at the level of the pyriforms; mild vallecular residue. Pt will likely aspirate all consistencies. SLP spoke with MD. If po's are continued, thin liquid diet would be recommended over solids or purees due to esophageal residue. ST will follow up next date for decision re: food/liquid. Pt may be a candidate for Palliative care given results of MBS.  Impact on safety and function Severe aspiration risk Some recent data might be  hidden   CHL IP TREATMENT RECOMMENDATION 02/25/2016 Treatment Recommendations Therapy as outlined in treatment plan below Some recent data might be hidden   Prognosis 02/25/2016 Prognosis for Safe Diet Advancement Fair Barriers to Reach Goals -- Barriers/Prognosis Comment -- Some recent data might be hidden CHL IP DIET RECOMMENDATION 02/25/2016 SLP Diet Recommendations NPO Liquid Administration via -- Medication Administration -- Compensations -- Postural Changes -- Some recent data might be hidden   No flowsheet data found.  CHL IP FOLLOW UP RECOMMENDATIONS 02/25/2016 Follow up Recommendations None Some recent data might be hidden   CHL IP FREQUENCY AND DURATION 02/25/2016 Speech Therapy Frequency (ACUTE ONLY) min 1 x/week Treatment Duration 1 week Some recent data might be hidden      CHL IP ORAL PHASE 02/25/2016 Oral Phase WFL Oral - Pudding Teaspoon -- Oral - Pudding Cup -- Oral - Honey Teaspoon -- Oral - Honey Cup -- Oral - Nectar Teaspoon -- Oral - Nectar Cup -- Oral - Nectar Straw -- Oral - Thin Teaspoon -- Oral - Thin Cup -- Oral - Thin Straw -- Oral - Puree -- Oral - Mech Soft -- Oral - Regular -- Oral - Multi-Consistency -- Oral - Pill -- Oral Phase - Comment -- Some recent data might be hidden  CHL IP PHARYNGEAL PHASE 02/25/2016 Pharyngeal Phase Impaired Pharyngeal- Pudding Teaspoon -- Pharyngeal -- Pharyngeal- Pudding Cup -- Pharyngeal -- Pharyngeal- Honey Teaspoon -- Pharyngeal -- Pharyngeal- Honey Cup -- Pharyngeal -- Pharyngeal- Nectar Teaspoon -- Pharyngeal -- Pharyngeal- Nectar Cup -- Pharyngeal -- Pharyngeal- Nectar Straw -- Pharyngeal -- Pharyngeal- Thin Teaspoon -- Pharyngeal -- Pharyngeal- Thin Cup -- Pharyngeal -- Pharyngeal- Thin Straw Delayed swallow initiation-pyriform sinuses;Pharyngeal residue - valleculae Pharyngeal -- Pharyngeal- Puree Delayed swallow initiation-vallecula Pharyngeal -- Pharyngeal- Mechanical Soft -- Pharyngeal -- Pharyngeal- Regular -- Pharyngeal -- Pharyngeal- Multi-consistency --  Pharyngeal -- Pharyngeal- Pill -- Pharyngeal -- Pharyngeal Comment --  Some recent data might be hidden  CHL IP CERVICAL ESOPHAGEAL PHASE 02/25/2016 Cervical Esophageal Phase WFL Pudding Teaspoon -- Pudding Cup -- Honey Teaspoon -- Honey Cup -- Nectar Teaspoon -- Nectar Cup -- Nectar Straw -- Thin Teaspoon -- Thin Cup -- Thin Straw -- Puree -- Mechanical Soft -- Regular -- Multi-consistency -- Pill -- Cervical Esophageal Comment -- Some recent data might be hidden No flowsheet data found. Victoria Fisher 02/25/2016, 4:30 PM    Victoria Fisher Colvin Caroli.[employer] [phone]            Microbiology: Recent Results (from the past 240 hour(s))  MRSA PCR Screening     Status: Abnormal   Collection Time: 02/25/16  3:42 PM  Result Value Ref Range Status   MRSA by PCR POSITIVE (A) NEGATIVE Final    Comment:        The GeneXpert MRSA Assay (FDA approved for NASAL specimens only), is one component of a comprehensive MRSA colonization surveillance program. It is not intended to diagnose MRSA infection nor to guide or monitor treatment for MRSA infections. RESULT CALLED TO, READ BACK BY AND VERIFIED WITH: R [facility] RN [id number] 02/25/16 A BROWNING      Labs: Basic Metabolic Panel:  Recent Labs Lab 02/28/16 0258 02/29/16 0300 03/02/16 0214 03/04/16 0258 03/05/16 0223  NA 145 144 147* 146* 146*  K 4.5 3.3* 3.9 3.9 4.5  CL 98* 98* 97* 106 104  CO2 28 31 34* 34* 35*  GLUCOSE 71 90 110* 160* 122*  BUN 93* 87* 84* 97* 84*  CREATININE 1.92* 1.65* 1.37* 1.17* 0.95  CALCIUM 9.7 9.9 10.4* 9.7 9.9   Liver Function Tests: No results for input(s): AST, ALT, ALKPHOS, BILITOT, PROT, ALBUMIN in the last 168 hours. No results for input(s): LIPASE, AMYLASE in the last 168 hours. No results for input(s): AMMONIA in the last 168 hours. CBC:  Recent Labs Lab 02/28/16 0554 03/05/16 0223  WBC 10.4 7.9  HGB 14.0 10.6*  HCT 43.7 35.6*  MCV 101.2* 107.2*  PLT 120* 99*   Cardiac Enzymes: No results for  input(s): CKTOTAL, CKMB, CKMBINDEX, TROPONINI in the last 168 hours. BNP: BNP (last 3 results)  Recent Labs  12/10/15 1236 01/05/16 1747 02/18/16 2220  BNP 399.4* 468.0* 344.5*    ProBNP (last 3 results) No results for input(s): PROBNP in the last 8760 hours.  CBG: No results for input(s): GLUCAP in the last 168 hours.

## 2016-03-05 NOTE — Discharge Instructions (Signed)
Aspiration Pneumonia °Aspiration pneumonia is an infection in your lungs. It occurs when food, liquid, or stomach contents (vomit) are inhaled (aspirated) into your lungs. When these things get into your lungs, swelling (inflammation) and infection can occur. This can make it difficult for you to breathe. Aspiration pneumonia is a serious condition and can be life threatening. °What increases the risk? °Aspiration pneumonia is more likely to occur when a person's cough (gag) reflex or ability to swallow has been decreased. Some things that can do this include: °· Having a brain injury or disease, such as stroke, seizures, Parkinson's disease, dementia, or amyotrophic lateral sclerosis (ALS). °· Being given general anesthetic for procedures. °· Being in a coma (unconscious). °· Having a narrowing of the tube that carries food to the stomach (esophagus). °· Drinking too much alcohol. If a person passes out and vomits, vomit can be swallowed into the lungs. °· Taking certain medicines, such as tranquilizers or sedatives. ° °What are the signs or symptoms? °· Coughing after swallowing food or liquids. °· Breathing problems, such as wheezing or shortness of breath. °· Bluish skin. This can be caused by lack of oxygen. °· Coughing up food or mucus. The mucus might contain blood, greenish material, or yellowish-white fluid (pus). °· Fever. °· Chest pain. °· Being more tired than usual (fatigue). °· Sweating more than usual. °· Bad breath. °How is this diagnosed? °A physical exam will be done. During the exam, the health care provider will listen to your lungs with a stethoscope to check for: °· Crackling sounds in the lungs. °· Decreased breath sounds. °· A rapid heartbeat. ° °Various tests may be ordered. These may include: °· Chest X-ray. °· CT scan. °· Swallowing study. This test looks at how food is swallowed and whether it goes into your breathing tube (trachea) or food pipe (esophagus). °· Sputum culture. Saliva and  mucus (sputum) are collected from the lungs or the tubes that carry air to the lungs (bronchi). The sputum is then tested for bacteria. °· Bronchoscopy. This test uses a flexible tube (bronchoscope) to see inside the lungs. ° °How is this treated? °Treatment will usually include antibiotic medicines. Other medicines may also be used to reduce fever or pain. You may need to be treated in the hospital. In the hospital, your breathing will be carefully monitored. Depending on how well you are breathing, you may need to be given oxygen, or you may need breathing support from a breathing machine (ventilator). For people who fail a swallowing study, a feeding tube might be placed in the stomach, or they may be asked to avoid certain food textures or liquids when they eat. °Follow these instructions at home: °· Carefully follow any special eating instructions you were given, such as avoiding certain food textures or thickening liquids. This reduces the risk of developing aspiration pneumonia again. °· Only take over-the-counter or prescription medicines as directed by your health care provider. Follow the directions carefully. °· If you were prescribed antibiotics, take them as directed. Finish them even if you start to feel better. °· Rest as instructed by your health care provider. °· Keep all follow-up appointments with your health care provider. °Contact a health care provider if: °· You develop worsening shortness of breath, wheezing, or difficulty breathing. °· You develop a fever. °· You have chest pain. °This information is not intended to replace advice given to you by your health care provider. Make sure you discuss any questions you have with your health care   provider. °Document Released: 11/30/2008 Document Revised: 07/17/2015 Document Reviewed: 07/21/2012 °Elsevier Interactive Patient Education © 2017 Elsevier Inc[employer] ° °

## 2016-03-06 ENCOUNTER — Telehealth: Payer: Self-pay

## 2016-03-06 DIAGNOSIS — R531 Weakness: Secondary | ICD-10-CM | POA: Diagnosis not present

## 2016-03-06 DIAGNOSIS — I2729 Other secondary pulmonary hypertension: Secondary | ICD-10-CM | POA: Diagnosis present

## 2016-03-06 DIAGNOSIS — R1312 Dysphagia, oropharyngeal phase: Secondary | ICD-10-CM | POA: Diagnosis not present

## 2016-03-06 DIAGNOSIS — R627 Adult failure to thrive: Secondary | ICD-10-CM | POA: Diagnosis not present

## 2016-03-06 DIAGNOSIS — J9690 Respiratory failure, unspecified, unspecified whether with hypoxia or hypercapnia: Secondary | ICD-10-CM | POA: Diagnosis not present

## 2016-03-06 DIAGNOSIS — I5082 Biventricular heart failure: Secondary | ICD-10-CM | POA: Diagnosis present

## 2016-03-06 DIAGNOSIS — R2689 Other abnormalities of gait and mobility: Secondary | ICD-10-CM | POA: Diagnosis not present

## 2016-03-06 DIAGNOSIS — G894 Chronic pain syndrome: Secondary | ICD-10-CM | POA: Diagnosis not present

## 2016-03-06 DIAGNOSIS — J9611 Chronic respiratory failure with hypoxia: Secondary | ICD-10-CM | POA: Diagnosis not present

## 2016-03-06 DIAGNOSIS — I5023 Acute on chronic systolic (congestive) heart failure: Secondary | ICD-10-CM | POA: Diagnosis not present

## 2016-03-06 DIAGNOSIS — I10 Essential (primary) hypertension: Secondary | ICD-10-CM | POA: Diagnosis not present

## 2016-03-06 DIAGNOSIS — J449 Chronic obstructive pulmonary disease, unspecified: Secondary | ICD-10-CM | POA: Diagnosis present

## 2016-03-06 DIAGNOSIS — J9601 Acute respiratory failure with hypoxia: Secondary | ICD-10-CM | POA: Diagnosis not present

## 2016-03-06 DIAGNOSIS — E669 Obesity, unspecified: Secondary | ICD-10-CM | POA: Diagnosis present

## 2016-03-06 DIAGNOSIS — R601 Generalized edema: Secondary | ICD-10-CM | POA: Diagnosis not present

## 2016-03-06 DIAGNOSIS — I890 Lymphedema, not elsewhere classified: Secondary | ICD-10-CM | POA: Diagnosis not present

## 2016-03-06 DIAGNOSIS — G4733 Obstructive sleep apnea (adult) (pediatric): Secondary | ICD-10-CM | POA: Diagnosis present

## 2016-03-06 DIAGNOSIS — J9621 Acute and chronic respiratory failure with hypoxia: Secondary | ICD-10-CM | POA: Diagnosis not present

## 2016-03-06 DIAGNOSIS — I5033 Acute on chronic diastolic (congestive) heart failure: Secondary | ICD-10-CM | POA: Diagnosis not present

## 2016-03-06 DIAGNOSIS — R0602 Shortness of breath: Secondary | ICD-10-CM | POA: Diagnosis not present

## 2016-03-06 DIAGNOSIS — N179 Acute kidney failure, unspecified: Secondary | ICD-10-CM | POA: Diagnosis not present

## 2016-03-06 DIAGNOSIS — M6281 Muscle weakness (generalized): Secondary | ICD-10-CM | POA: Diagnosis not present

## 2016-03-06 DIAGNOSIS — Z66 Do not resuscitate: Secondary | ICD-10-CM | POA: Diagnosis present

## 2016-03-06 DIAGNOSIS — Z6831 Body mass index (BMI) 31.0-31.9, adult: Secondary | ICD-10-CM | POA: Diagnosis not present

## 2016-03-06 DIAGNOSIS — D72829 Elevated white blood cell count, unspecified: Secondary | ICD-10-CM | POA: Diagnosis not present

## 2016-03-06 DIAGNOSIS — I481 Persistent atrial fibrillation: Secondary | ICD-10-CM | POA: Diagnosis not present

## 2016-03-06 DIAGNOSIS — G4700 Insomnia, unspecified: Secondary | ICD-10-CM | POA: Diagnosis present

## 2016-03-06 DIAGNOSIS — R296 Repeated falls: Secondary | ICD-10-CM | POA: Diagnosis present

## 2016-03-06 DIAGNOSIS — I509 Heart failure, unspecified: Secondary | ICD-10-CM | POA: Diagnosis not present

## 2016-03-06 DIAGNOSIS — R931 Abnormal findings on diagnostic imaging of heart and coronary circulation: Secondary | ICD-10-CM | POA: Diagnosis not present

## 2016-03-06 DIAGNOSIS — R1310 Dysphagia, unspecified: Secondary | ICD-10-CM | POA: Diagnosis not present

## 2016-03-06 DIAGNOSIS — I5032 Chronic diastolic (congestive) heart failure: Secondary | ICD-10-CM | POA: Diagnosis not present

## 2016-03-06 DIAGNOSIS — J101 Influenza due to other identified influenza virus with other respiratory manifestations: Secondary | ICD-10-CM | POA: Diagnosis not present

## 2016-03-06 DIAGNOSIS — Z79899 Other long term (current) drug therapy: Secondary | ICD-10-CM | POA: Diagnosis not present

## 2016-03-06 DIAGNOSIS — M6289 Other specified disorders of muscle: Secondary | ICD-10-CM | POA: Diagnosis not present

## 2016-03-06 DIAGNOSIS — R32 Unspecified urinary incontinence: Secondary | ICD-10-CM | POA: Diagnosis not present

## 2016-03-06 DIAGNOSIS — R2240 Localized swelling, mass and lump, unspecified lower limb: Secondary | ICD-10-CM | POA: Diagnosis not present

## 2016-03-06 DIAGNOSIS — I5021 Acute systolic (congestive) heart failure: Secondary | ICD-10-CM | POA: Diagnosis not present

## 2016-03-06 DIAGNOSIS — D631 Anemia in chronic kidney disease: Secondary | ICD-10-CM | POA: Diagnosis present

## 2016-03-06 DIAGNOSIS — I2510 Atherosclerotic heart disease of native coronary artery without angina pectoris: Secondary | ICD-10-CM | POA: Diagnosis present

## 2016-03-06 DIAGNOSIS — K5909 Other constipation: Secondary | ICD-10-CM | POA: Diagnosis not present

## 2016-03-06 DIAGNOSIS — E870 Hyperosmolality and hypernatremia: Secondary | ICD-10-CM | POA: Diagnosis not present

## 2016-03-06 DIAGNOSIS — D696 Thrombocytopenia, unspecified: Secondary | ICD-10-CM | POA: Diagnosis not present

## 2016-03-06 DIAGNOSIS — R600 Localized edema: Secondary | ICD-10-CM | POA: Diagnosis not present

## 2016-03-06 DIAGNOSIS — I110 Hypertensive heart disease with heart failure: Secondary | ICD-10-CM | POA: Diagnosis not present

## 2016-03-06 DIAGNOSIS — N183 Chronic kidney disease, stage 3 (moderate): Secondary | ICD-10-CM | POA: Diagnosis not present

## 2016-03-06 DIAGNOSIS — E8770 Fluid overload, unspecified: Secondary | ICD-10-CM | POA: Diagnosis not present

## 2016-03-06 DIAGNOSIS — Z9981 Dependence on supplemental oxygen: Secondary | ICD-10-CM | POA: Diagnosis not present

## 2016-03-06 DIAGNOSIS — J189 Pneumonia, unspecified organism: Secondary | ICD-10-CM | POA: Diagnosis not present

## 2016-03-06 DIAGNOSIS — G9340 Encephalopathy, unspecified: Secondary | ICD-10-CM | POA: Diagnosis not present

## 2016-03-06 DIAGNOSIS — I480 Paroxysmal atrial fibrillation: Secondary | ICD-10-CM | POA: Diagnosis present

## 2016-03-06 DIAGNOSIS — J90 Pleural effusion, not elsewhere classified: Secondary | ICD-10-CM | POA: Diagnosis not present

## 2016-03-06 DIAGNOSIS — I130 Hypertensive heart and chronic kidney disease with heart failure and stage 1 through stage 4 chronic kidney disease, or unspecified chronic kidney disease: Secondary | ICD-10-CM | POA: Diagnosis present

## 2016-03-06 DIAGNOSIS — J42 Unspecified chronic bronchitis: Secondary | ICD-10-CM | POA: Diagnosis not present

## 2016-03-06 DIAGNOSIS — E039 Hypothyroidism, unspecified: Secondary | ICD-10-CM | POA: Diagnosis not present

## 2016-03-06 DIAGNOSIS — I482 Chronic atrial fibrillation: Secondary | ICD-10-CM | POA: Diagnosis not present

## 2016-03-06 DIAGNOSIS — I2781 Cor pulmonale (chronic): Secondary | ICD-10-CM | POA: Diagnosis not present

## 2016-03-06 DIAGNOSIS — D638 Anemia in other chronic diseases classified elsewhere: Secondary | ICD-10-CM | POA: Diagnosis not present

## 2016-03-06 DIAGNOSIS — R300 Dysuria: Secondary | ICD-10-CM | POA: Diagnosis not present

## 2016-03-06 DIAGNOSIS — R110 Nausea: Secondary | ICD-10-CM | POA: Diagnosis not present

## 2016-03-06 DIAGNOSIS — K5901 Slow transit constipation: Secondary | ICD-10-CM | POA: Diagnosis not present

## 2016-03-06 DIAGNOSIS — J690 Pneumonitis due to inhalation of food and vomit: Secondary | ICD-10-CM | POA: Diagnosis not present

## 2016-03-06 DIAGNOSIS — J9600 Acute respiratory failure, unspecified whether with hypoxia or hypercapnia: Secondary | ICD-10-CM | POA: Diagnosis not present

## 2016-03-06 DIAGNOSIS — E876 Hypokalemia: Secondary | ICD-10-CM | POA: Diagnosis not present

## 2016-03-06 DIAGNOSIS — I5043 Acute on chronic combined systolic (congestive) and diastolic (congestive) heart failure: Secondary | ICD-10-CM | POA: Diagnosis present

## 2016-03-06 LAB — CBC
HEMATOCRIT: 34.2 % — AB (ref 36.0–46.0)
HEMOGLOBIN: 10.3 g/dL — AB (ref 12.0–15.0)
MCH: 32.4 pg (ref 26.0–34.0)
MCHC: 30.1 g/dL (ref 30.0–36.0)
MCV: 107.5 fL — AB (ref 78.0–100.0)
Platelets: 91 10*3/uL — ABNORMAL LOW (ref 150–400)
RBC: 3.18 MIL/uL — ABNORMAL LOW (ref 3.87–5.11)
RDW: 15.9 % — ABNORMAL HIGH (ref 11.5–15.5)
WBC: 7.4 10*3/uL (ref 4.0–10.5)

## 2016-03-06 NOTE — Progress Notes (Addendum)
Patient medically stable for discharge today to SNF. Patient will go to [facility]. Call placed to SNF, awaiting call back for time and transfer.  Call returned that patient can go to SNF: [facility].  Room 805B.  Report number:  [id number]  Patient will go by EMS. Family is aware of plan and in agreement.  Yolanda Bonine has been involved.  LCSW has sent all clinicals to SNF and updated FL2.  Lane Hacker, MSW Clinical Social Work: [employer] Coverage for :  [phone]

## 2016-03-06 NOTE — Telephone Encounter (Signed)
Faxed signed physician orders to [facility] on 02/27/16.
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
## 2016-03-06 NOTE — Progress Notes (Signed)
Pt medically stable for discharge to SNF today. Please refer to discharge summary done 03/05/2016. No changes in medical management since 03/05/2016.  Leisa Lenz [facility] [phone]

## 2016-03-06 NOTE — Progress Notes (Signed)
Discharged to home with family office visits in place teaching done  

## 2016-03-06 NOTE — Progress Notes (Signed)
Physical Therapy Treatment Patient Details Name: Victoria Fisher MRN: [id number] DOB: Nov 29, 1934 Today's Date: 03/06/2016    History of Present Illness 82 y.o. female who came to [facility] on 02/18/16 due to weakness and frequent falls.  Pt with increased SOB, (+) for flu.  Pt with respiratory distress 1/9 and P.T. order cancelled. Pt with significant PMHx of TB, Pulmonary HTN, milroy's disease (lymphatic disorder), kidney failure, frequent falls, bil LE edema, CAD, COPD, colon CA s/p resection, chronic diastolic CHF, A-fib, and chronic vertigo.     PT Comments    Patient limited today due to lethargy and dizziness.  Was able to perform LE exercises with repeated verbal cues.  Follow Up Recommendations  SNF;Supervision/Assistance - 24 hour     Equipment Recommendations  None recommended by PT    Recommendations for Other Services       Precautions / Restrictions Precautions Precautions: Fall Precaution Comments: pt with significant hx of frequent falls and vertigo Restrictions Weight Bearing Restrictions: No    Mobility  Bed Mobility               General bed mobility comments: Patient in recliner  Transfers Overall transfer level: Needs assistance Equipment used: Rolling walker (2 wheeled) Transfers: Sit to/from Stand           General transfer comment: Verbal cues for hand placement and to scoot to edge of chair.  Assist to bring trunk forward away from chair.  Assist to maintain upright position.  Attempted to scoot forward on chair, and patient pushed herself back against chair.  Reports too dizzy to move today, declining PT with max encouragement.  Agreed to perform therapeutic exercises.  Ambulation/Gait                 Stairs            Wheelchair Mobility    Modified Rankin (Stroke Patients Only)       Balance                                    Cognition Arousal/Alertness: Lethargic (Eyes closed during session) Behavior  During Therapy: WFL for tasks assessed/performed;Anxious Overall Cognitive Status: Impaired/Different from baseline Area of Impairment: Orientation;Memory;Safety/judgement;Awareness;Problem solving Orientation Level: Disoriented to;Place;Time;Situation   Memory: Decreased short-term memory   Safety/Judgement: Decreased awareness of safety   Problem Solving: Slow processing General Comments: Patient keeping eyes closed during session, reports due to dizziness.  Patient unaware of d/c plans.      Exercises General Exercises - Lower Extremity Ankle Circles/Pumps: AROM;Both;10 reps;Seated Long Arc Quad: AROM;Both;10 reps;Seated Heel Slides: AROM;Both;10 reps;Seated Hip ABduction/ADduction: AROM;Both;5 reps;Seated Hip Flexion/Marching: AROM;Both;10 reps;Seated    General Comments        Pertinent Vitals/Pain Pain Assessment: Faces Faces Pain Scale: Hurts little more Pain Location: bil legs Pain Descriptors / Indicators: Aching;Sore Pain Intervention(s): Limited activity within patient's tolerance;Monitored during session    Home Living                      Prior Function            PT Goals (current goals can now be found in the care plan section) Acute Rehab PT Goals Patient Stated Goal: to be able to walk Progress towards PT goals: Not progressing toward goals - comment (Due to chronic vertigo today)    Frequency    Min 2X/week  PT Plan Current plan remains appropriate    Co-evaluation             End of Session Equipment Utilized During Treatment: Oxygen Activity Tolerance: Patient limited by lethargy (Limited by dizziness) Patient left: in chair;with call bell/phone within reach;with chair alarm set     Time: [id number] PT Time Calculation (min) (ACUTE ONLY): 16 min  Charges:  $Therapeutic Exercise: 8-22 mins                    G Codes:      Despina Pole 03-21-16, 1:07 PM Carita Pian. Sanjuana Kava, [place] Pager  [phone]

## 2016-03-09 ENCOUNTER — Other Ambulatory Visit: Payer: Self-pay | Admitting: Licensed Clinical Social Worker

## 2016-03-09 ENCOUNTER — Other Ambulatory Visit: Payer: Self-pay

## 2016-03-09 ENCOUNTER — Encounter: Payer: Self-pay | Admitting: Licensed Clinical Social Worker

## 2016-03-09 ENCOUNTER — Non-Acute Institutional Stay (SKILLED_NURSING_FACILITY): Payer: Medicare Other | Admitting: Adult Health

## 2016-03-09 ENCOUNTER — Encounter: Payer: Self-pay | Admitting: Adult Health

## 2016-03-09 DIAGNOSIS — G9340 Encephalopathy, unspecified: Secondary | ICD-10-CM

## 2016-03-09 DIAGNOSIS — R1312 Dysphagia, oropharyngeal phase: Secondary | ICD-10-CM

## 2016-03-09 DIAGNOSIS — D696 Thrombocytopenia, unspecified: Secondary | ICD-10-CM

## 2016-03-09 DIAGNOSIS — J441 Chronic obstructive pulmonary disease with (acute) exacerbation: Secondary | ICD-10-CM

## 2016-03-09 DIAGNOSIS — I482 Chronic atrial fibrillation, unspecified: Secondary | ICD-10-CM

## 2016-03-09 DIAGNOSIS — E039 Hypothyroidism, unspecified: Secondary | ICD-10-CM

## 2016-03-09 DIAGNOSIS — K5901 Slow transit constipation: Secondary | ICD-10-CM

## 2016-03-09 DIAGNOSIS — J9601 Acute respiratory failure with hypoxia: Secondary | ICD-10-CM

## 2016-03-09 DIAGNOSIS — E870 Hyperosmolality and hypernatremia: Secondary | ICD-10-CM

## 2016-03-09 DIAGNOSIS — N179 Acute kidney failure, unspecified: Secondary | ICD-10-CM | POA: Diagnosis not present

## 2016-03-09 DIAGNOSIS — J690 Pneumonitis due to inhalation of food and vomit: Secondary | ICD-10-CM

## 2016-03-09 DIAGNOSIS — I890 Lymphedema, not elsewhere classified: Secondary | ICD-10-CM

## 2016-03-09 DIAGNOSIS — J101 Influenza due to other identified influenza virus with other respiratory manifestations: Secondary | ICD-10-CM | POA: Diagnosis not present

## 2016-03-09 DIAGNOSIS — R531 Weakness: Secondary | ICD-10-CM | POA: Diagnosis not present

## 2016-03-09 DIAGNOSIS — I5033 Acute on chronic diastolic (congestive) heart failure: Secondary | ICD-10-CM

## 2016-03-09 DIAGNOSIS — R296 Repeated falls: Secondary | ICD-10-CM

## 2016-03-09 DIAGNOSIS — I10 Essential (primary) hypertension: Secondary | ICD-10-CM

## 2016-03-09 DIAGNOSIS — N183 Chronic kidney disease, stage 3 (moderate): Secondary | ICD-10-CM

## 2016-03-09 NOTE — Patient Outreach (Signed)
[facility]) Care Management  03/09/2016  Victoria Fisher 1934/09/25 [id number]  Assessment- CSW received new referral on patient. She is now at [facility] since discharging from hospital. CSW completed call to patient's son and he answered. HIPPA verifications provided. He reports that he was working all weekend and no one informed him that his mother was transferred to [facility]. He reports that his nephew is looking at nursing facilities near [place], [place] where patient and spouse can be placed. CSW informed son that she will be completing SNF visit this week.  Plan-CSW will complete SNF visit this week and assist with discharging planning.  [name], BSW, MSW, [email or web address] Phone: [phone] Fax: [phone]

## 2016-03-09 NOTE — Progress Notes (Signed)
DATE:  03/09/2016   MRN:  [id number]  BIRTHDAY: 09-24-1934  Facility:  Nursing Home Location:  [place] Room Number: 805-B  LEVEL OF CARE:  SNF (31)  Contact Information    Name Relation Home Work [place] Spouse [phone]  [phone]   Shiela Mayer   [phone]   Agapito Games Daughter   [phone]       Code Status History    Date Active Date Inactive Code Status Order ID Comments User Context   02/19/2016  5:17 AM 03/06/2016  5:20 PM Full Code [id number]  Rise Patience, MD ED   01/05/2016  9:57 PM 01/09/2016  4:42 PM Full Code [id number]  Ivor Costa, MD ED   11/14/2015  8:38 PM 11/17/2015  2:19 PM Full Code [id number]  Karmen Bongo, MD Inpatient   09/09/2015  9:31 PM 09/13/2015  9:44 PM Full Code [id number]  Ivor Costa, MD ED   07/07/2013  6:19 AM 07/11/2013  2:32 PM Full Code [id number]  Haywood Pao, MD Inpatient   06/08/2013  6:52 AM 06/11/2013  7:18 PM Full Code [id number]  Berle Mull, MD Inpatient       Chief Complaint  Patient presents with  . Hospitalization Follow-up    HISTORY OF PRESENT ILLNESS:  This is a 82-YO female seen for hospital follow-up.  She was admitted to [facility] on 03/06/2016 following an admission at [facility] 02/18/2016-03/05/2016 for acute bronchitis. She continued to decline and was given a dose of Narcan with improvement in mental statu then changed to Levaquins. She had respiratory failure with hypoxia and was placed on BiPAP. She had wheezing and was placed on solu-medrol then changed to Prednisone. She was treated for pneumonia with Meropenem then changed to Levaquin.  She has PMH of chronic diastolic CHF, cor pulmonale, hypertension, chronic lymphedema, CAD, chronic kidney disease, atrial fibrillation, chronic pain syndrome, COPD, frequent falls, hypothyroidism and OSA.    PAST MEDICAL HISTORY:  Past Medical History:  Diagnosis Date  . Abnormality of gait  01/30/2015  . Anemia in CKD (chronic kidney disease)   . Anxiety   . Arthritis   . Asthma    On nebulizer  . Atrial fibrillation, chronic ([place])    a. Rate controlled w/ BB. No OAC 2/2 falls and gait instability.  . Bowel obstruction    Caused by scar tissue  . Chronic cor pulmonale (HCC)    Most likely due to COPD (cannot rule out contribution of diastolic LV dysfunction). ECHO 07/30/11 pulmonary artery pressure was estimated to be 50-60% mmHg & there was moderate tricuspid insufficiency.  . Chronic diastolic CHF (congestive heart failure) ([place])    a. Predominantly right heart failure due to cor pulmonale & in turn this is most likely due to untreated OSA;  b. 05/2013 Echo: EF 55-60%, no rwma, mild to mod MR, sev dil LA, mod to sev dil RV w/ reduced systolic fxn, massively dil RA, mod-sev TR, PASP 10mmHg.  [name] Chronic pain syndrome   . Colon cancer ([place])    Carcinoma of sigmoid colon 1984 & 1985.  [name] COPD (chronic obstructive pulmonary disease) ([place])   . Coronary artery disease    a. occluded proximal diagonal artery on angiography 2004  . Depression   . Diverticulosis of colon   . 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 Dyspnea on exertion    Reluctant to take diuretics  . Edema of both legs  Venous Doppler 05/11/12 was normal & no evidence of thrombus or thrombophlebitis. Chronic LE edema related to both cor pulmonale & lymphedema, hyperlipidemia & HTN involving coronary atherosclerosis by cardiac cath in 2004. Cath showed a tiny old occlusion of the optional diagonal branch & also proximal LAD.  [name] Frequent falls    Possibly due to orthostatic hypotension but we have not seen that on our visits. Recent fall 05/2012 with scalp hematoma, swollen left LE, & black eyes.  . Hyperlipidemia    On a statin. Catheterization 02/12/03 Showed a tiny old occlusion of the optional diagonal branch & also proximal LAD.  [name] Hypertension   . Hypothyroidism 2002   On hormone replacement therapy  . IBS  (irritable bowel syndrome)   . Insomnia   . Kidney failure 09/05/2015   Pt states that she went to the doctor on thursday and was told she is in stage 4 kidney failure  . Lung nodule   . Milroy's disease    Lymphatic disorder diagnosed at [facility]  . Obstructive sleep apnea    Polysomnogram 09/25/11 AHI: 53.6/hr overall & 57.1/hr during REM sleep. Poor compliance previously with CPAP  . Pulmonary hypertension    Pullmonary hypertension, chronic cor pulmonale w/pulmonary artery pressures of 50&#x2011; 60 mmHg.  [name] Refusal of blood transfusions as patient is Jehovah's Witness   . Tuberculosis      CURRENT MEDICATIONS: Reviewed  Patient's Medications  New Prescriptions   No medications on file  Previous Medications   ACETAMINOPHEN (TYLENOL) 160 MG/5ML SOLUTION    Take 20.3 mLs (650 mg total) by mouth 3 (three) times daily.   DOCUSATE SODIUM (COLACE) 100 MG CAPSULE    Take 100 mg by mouth 2 (two) times daily.   IPRATROPIUM-ALBUTEROL (DUONEB) 0.5-2.5 (3) MG/3ML SOLN    Take 3 mLs by nebulization every 2 (two) hours as needed.   IPRATROPIUM-ALBUTEROL (DUONEB) 0.5-2.5 (3) MG/3ML SOLN    Take 3 mLs by nebulization 3 (three) times daily.   LEVOFLOXACIN (LEVAQUIN) 500 MG TABLET    Take 1 tablet (500 mg total) by mouth daily.   LEVOTHYROXINE (SYNTHROID, LEVOTHROID) 112 MCG TABLET    Take 1 tablet (112 mcg total) by mouth every morning.   METOPROLOL SUCCINATE (TOPROL-XL) 50 MG 24 HR TABLET    Take 50 mg by mouth daily. Take with or immediately following a meal.   MOMETASONE (NASONEX) 50 MCG/ACT NASAL SPRAY    Place 2 sprays into the nose daily as needed (congestion).   MORPHINE (MSIR) 15 MG TABLET    Take 1 tablet (15 mg total) by mouth 3 (three) times daily as needed (breakthrough pain).   MORPHINE SULFATE (MORPHINE CONCENTRATE) 10 MG/0.5ML SOLN CONCENTRATED SOLUTION    Take 0.5 mLs (10 mg total) by mouth 3 (three) times daily.   NUTRITIONAL SUPPLEMENT LIQD    Take by mouth 2 (two) times daily. MedPass     POLYETHYLENE GLYCOL (MIRALAX / GLYCOLAX) PACKET    Take 17 g by mouth 2 (two) times daily as needed (constipation). Mix in 8 oz liquid and drink   PREDNISONE (DELTASONE) 50 MG TABLET    Take 1 tablet (50 mg total) by mouth daily with breakfast.   PROBIOTIC PRODUCT (PROBIOTIC PO)    Take 1 tablet by mouth daily. Florastor   SENNA (SENOKOT) 8.6 MG TABS TABLET    Take 2-3 tablets by mouth daily as needed for mild constipation.    VITAMIN E 400 UNIT CAPSULE    Take 400 Units  by mouth daily.  Modified Medications   No medications on file  Discontinued Medications   FEEDING SUPPLEMENT, ENSURE ENLIVE, (ENSURE ENLIVE) LIQD    Take 237 mLs by mouth 2 (two) times daily between meals.     Allergies  Allergen Reactions  . Codeine Anaphylaxis, Hives and Nausea And Vomiting  . Gabapentin Swelling and Other (See Comments)    Hallucinations, severe facial swelling   . Other Other (See Comments)    Will not accept blood or blood products - patient is Jehovah's Witness  . Ciprofloxacin Hives and Nausea And Vomiting  . Latex Hives and Rash  . Penicillins Hives    Has patient had a PCN reaction causing immediate rash, facial/tongue/throat swelling, SOB or lightheadedness with hypotension: Yes Has patient had a PCN reaction causing severe rash involving mucus membranes or skin necrosis: Yes Has patient had a PCN reaction that required hospitalization Yes Has patient had a PCN reaction occurring within the last 10 years: Yes If all of the above answers are "NO", then may proceed with Cephalosporin use.  . Sulfonamide Derivatives Hives and Rash     REVIEW OF SYSTEMS:  GENERAL: no change in appetite, no fatigue, no weight changes, no fever, chills or weakness EYES: Denies change in vision, dry eyes, eye pain, itching or discharge EARS: Denies change in hearing, ringing in ears, or earache NOSE: Denies nasal congestion or epistaxis MOUTH and THROAT: Denies oral discomfort, gingival pain or bleeding,  pain from teeth or hoarseness   RESPIRATORY: no cough, SOB, DOE, wheezing, hemoptysis CARDIAC: no chest pain, or palpitations GI: no abdominal pain, diarrhea, +constipation, heart burn, nausea or vomiting GU: Denies dysuria, frequency, hematuria, incontinence, or discharge PSYCHIATRIC: Denies feeling of depression or anxiety. No report of hallucinations, insomnia, paranoia, or agitation     PHYSICAL EXAMINATION  GENERAL APPEARANCE: Well nourished. In no acute distress. Normal body habitus SKIN:  Right lower leg with dry scaly skin, bruising on that side of face HEAD: Normal in size and contour. No evidence of trauma EYES: Lids open and close normally. No blepharitis, entropion or ectropion. PERRL. Conjunctivae are clear and sclerae are white. Lenses are without opacity EARS: Pinnae are normal. Patient hears normal voice tunes of the examiner MOUTH and THROAT: Lips are without lesions. Oral mucosa is moist and without lesions. Tongue is normal in shape, size, and color and without lesions NECK: supple, trachea midline, no neck masses, no thyroid tenderness, no thyromegaly LYMPHATICS: no LAN in the neck, no supraclavicular LAN RESPIRATORY: breathing is even & unlabored, BS CTAB, O2 @ 2L/min via [place] CARDIAC: RRR, no murmur,no extra heart sounds, BLE 3+ edema GI: abdomen soft, normal BS, no masses, no tenderness, no hepatomegaly, no splenomegaly EXTREMITIES:  Able to move X 4 extremities PSYCHIATRIC: Alert and oriented X 3. Affect and behavior are appropriate  LABS/RADIOLOGY: Labs reviewed: Basic Metabolic Panel:  Recent Labs  09/12/15 0912  11/16/15 0619 11/17/15 0605  12/18/15 1131  03/02/16 0214 03/04/16 0258 03/05/16 0223  NA 141  < > 140 142  142  < > 137  < > 147* 146* 146*  K 3.6  < > 4.6 3.9  4.0  < > 3.2*  < > 3.9 3.9 4.5  CL 105  < > 101 100*  100*  < > 92*  < > 97* 106 104  CO2 28  < > 34* 34*  34*  < > 34*  < > 34* 34* 35*  GLUCOSE 115*  < > 97  84  86  < > 93  <  > 110* 160* 122*  BUN 40*  < > 32* 31*  30*  < > 84*  < > 84* 97* 84*  CREATININE 1.38*  < > 1.49* 1.36*  1.41*  < > 1.88*  < > 1.37* 1.17* 0.95  CALCIUM 9.4  < > 9.6 10.0  10.0  < > 9.7  < > 10.4* 9.7 9.9  MG 2.4  --   --   --   --  2.5  --   --   --   --   PHOS  --   --  2.7 2.4*  --   --   --   --   --   --   < > = values in this interval not displayed. Liver Function Tests:  Recent Labs  01/31/16 0922 02/18/16 2220 02/19/16 0533  AST 27 50* 54*  ALT 11* 16 15  ALKPHOS 63 70 63  BILITOT 0.8 1.4* 1.5*  PROT 6.9 7.2 6.5  ALBUMIN 3.4* 3.3* 3.1*    Recent Labs  01/31/16 0922  LIPASE 19   CBC:  Recent Labs  02/18/16 2220 02/19/16 0533  02/21/16 0614  02/28/16 0554 03/05/16 0223 03/06/16 0322  WBC 3.8* 3.7*  < > 4.7  < > 10.4 7.9 7.4  NEUTROABS 2.6 2.3  --  3.3  --   --   --   --   HGB 12.8 12.0  < > 12.1  < > 14.0 10.6* 10.3*  HCT 39.0 37.4  < > 39.0  < > 43.7 35.6* 34.2*  MCV 101.0* 100.3*  < > 104.0*  < > 101.2* 107.2* 107.5*  PLT 73* 90*  < > 83*  < > 120* 99* 91*  < > = values in this interval not displayed. Lipid Panel:  Recent Labs  09/10/15 0403  HDL 32*   Cardiac Enzymes:  Recent Labs  09/09/15 1821  02/19/16 1226 02/19/16 1840 02/26/16 0951  CKTOTAL 244*  --   --   --   --   TROPONINI  --   < > 0.04* 0.04* <0.03  < > = values in this interval not displayed. CBG:  Recent Labs  09/13/15 0754 09/13/15 1302 01/31/16 0924  GLUCAP 79 87 81      Dg Chest 2 View  Result Date: 02/23/2016 CLINICAL DATA:  Left lower lobe pneumonia. EXAM: CHEST  2 VIEW COMPARISON:  PA and lateral chest 02/21/2014 and 02/19/2014. FINDINGS: Left lower lobe airspace disease continues to improve. There is marked cardiomegaly without edema. Right lung is clear. No pneumothorax or pleural fluid. Aortic atherosclerosis noted. IMPRESSION: Continued improvement in left lower lobe airspace disease. Cardiomegaly without edema. Atherosclerosis. Electronically Signed   By:  Inge Rise M.D.   On: 02/23/2016 12:40   Dg Chest 2 View  Result Date: 02/22/2016 CLINICAL DATA:  Bronchitis and volume overload. EXAM: CHEST  2 VIEW COMPARISON:  February 20, 2016 FINDINGS: The left retrocardiac opacity seen previously has improved. No overt edema. Stable cardiomegaly. IMPRESSION: Improving left retrocardiac opacity.  No overt edema. Electronically Signed   By: Dorise Bullion III M.D   On: 02/22/2016 17:31   Dg Chest 2 View  Result Date: 02/20/2016 CLINICAL DATA:  82 year old female with history of pneumonia. Followup study. EXAM: CHEST  2 VIEW COMPARISON:  Chest x-ray 02/18/2016. FINDINGS: Lung volumes are normal. Retrocardiac opacity obscuring portions of the left hemidiaphragm, concerning for left lower lobe pneumonia. Possible  trace left pleural effusion. Right lung is clear. No pneumothorax. Cephalization of the pulmonary vasculature, without frank pulmonary edema. Mild cardiomegaly. The patient is rotated to the left on today's exam, resulting in distortion of the mediastinal contours and reduced diagnostic sensitivity and specificity for mediastinal pathology. Aortic atherosclerosis. IMPRESSION: 1. Findings are concerning for left lower lobe pneumonia, likely with trace left pleural effusion. Followup PA and lateral chest X-ray is recommended in 3-4 weeks following trial of antibiotic therapy to ensure resolution and exclude underlying malignancy. 2. Cardiomegaly with pulmonary venous congestion, but no frank pulmonary edema. 3. Aortic atherosclerosis. Electronically Signed   By: Vinnie Langton M.D.   On: 02/20/2016 13:50   Dg Chest 2 View  Result Date: 02/18/2016 CLINICAL DATA:  Fever and flu like symptoms EXAM: CHEST  2 VIEW COMPARISON:  02/05/2016 FINDINGS: Moderate cardiomegaly with globular cardiac configuration as before. Atherosclerosis of the aorta. No acute consolidation or effusion. No pneumothorax. IMPRESSION: 1. Moderate globular enlargement of the cardiac  silhouette without overt failure 2. No acute infiltrates Electronically Signed   By: Donavan Foil M.D.   On: 02/18/2016 22:56   Ct Head Wo Contrast  Result Date: 02/19/2016 CLINICAL DATA:  Wheezing, fever. History of hypertension, frequent falls, colon cancer. EXAM: CT HEAD WITHOUT CONTRAST TECHNIQUE: Contiguous axial images were obtained from the base of the skull through the vertex without intravenous contrast. COMPARISON:  CT HEAD January 31, 2016 FINDINGS: BRAIN: The ventricles and sulci are normal for age. No intraparenchymal hemorrhage, mass effect nor midline shift. Patchy supratentorial white matter hypodensities less than expected for patient's age, though non-specific are most compatible with chronic small vessel ischemic disease. No acute large vascular territory infarcts. No abnormal extra-axial fluid collections. Basal cisterns are patent. VASCULAR: Mild to moderate calcific atherosclerosis of the carotid siphons. SKULL: No skull fracture. Moderate LEFT frontoparietal scalp hematomas. No subcutaneous gas or radiopaque foreign bodies. LEFT facial and periorbital soft tissue swelling compatible with contusion. SINUSES/ORBITS: Mild paranasal sinus mucosal thickening. Mastoid air cells are well aerated. Status post bilateral ocular lens implants. The included ocular globes and orbital contents are non-suspicious. OTHER: None. IMPRESSION: Multiple scalp hematomas and LEFT facial contusion. No skull fracture. No acute intracranial process ; negative stable examination for age. Electronically Signed   By: Elon Alas M.D.   On: 02/19/2016 00:46   Dg Pelvis Portable  Result Date: 02/19/2016 CLINICAL DATA:  RIGHT leg pain, recent fall EXAM: PORTABLE PELVIS 1-2 VIEWS COMPARISON:  Portable exam 1008 hours compared to 11/18/2014 FINDINGS: Rotated to the LEFT. Osseous demineralization. Hip joints symmetric and preserved. No definite fracture, dislocation, or bone destruction. IMPRESSION: Osseous  demineralization without acute bony abnormalities identified on rotated exam. Electronically Signed   By: Lavonia Dana M.D.   On: 02/19/2016 10:21   Dg Chest Portable 1 View  Result Date: 02/27/2016 CLINICAL DATA:  82 year old female with shortness of Breath. COPD, cor pulmonale. Initial encounter. EXAM: PORTABLE CHEST 1 VIEW COMPARISON:  02/26/2016 and earlier. FINDINGS: Portable AP semi upright view at 0423 hours. The patient remains rotated to the left. Stable cardiomegaly and mediastinal contours. Calcified aortic atherosclerosis. Interval decreased pulmonary vascularity. No acute edema. Regressed platelike perihilar opacity. No pneumothorax. No definite pleural effusion. No areas of worsening ventilation. IMPRESSION: 1. Interval improved ventilation with regressed pulmonary vascular congestion and perihilar atelectasis. 2. Stable cardiomegaly.  Calcified aortic atherosclerosis. Electronically Signed   By: Genevie Ann M.D.   On: 02/27/2016 06:58   Dg Chest [facility] 1 View  Result  Date: 02/26/2016 CLINICAL DATA:  Shortness of breath and respiratory distress EXAM: PORTABLE CHEST 1 VIEW COMPARISON:  Chest radiograph 02/25/2016 FINDINGS: Cardiomegaly can't aortic atherosclerosis are unchanged. Interstitial opacities in the right lung have worsened. Suspected lingular atelectasis has increased. No pneumothorax or sizable pleural effusion. IMPRESSION: Massive cardiomegaly, aortic atherosclerosis and slightly worsening pulmonary edema. Electronically Signed   By: Ulyses Jarred M.D.   On: 02/26/2016 03:30   Dg Chest Port 1 View  Result Date: 02/25/2016 CLINICAL DATA:  Asthma-COPD. Respiratory distress. History of atrial fibrillation, CHF, pulmonary hypertension, colonic malignancy. EXAM: PORTABLE CHEST 1 VIEW COMPARISON:  Chest x-ray of February 23, 2016 FINDINGS: The lungs are adequately inflated. The interstitial markings on the right are more conspicuous today. The cardiac silhouette remains enlarged. The pulmonary  vascularity is not engorged. A small right pleural effusion has developed. There is calcification in the wall of the aortic arch. The observed bony thorax is unremarkable. IMPRESSION: Patchy increased interstitial density on the right may reflect early interstitial pneumonia or asymmetric pulmonary edema new small right pleural effusion. Stable cardiomegaly. Thoracic aortic atherosclerosis. Electronically Signed   By: David  Martinique M.D.   On: 02/25/2016 07:04   Dg Swallowing Func-speech Pathology  Result Date: 02/25/2016 Objective Swallowing Evaluation: Type of Study: MBS-Modified Barium Swallow Study Patient Details Name: Victoria Fisher MRN: [id number] Date of Birth: 1934-07-20 Today's Date: 02/25/2016 Time: SLP Start Time (ACUTE ONLY): 1350-SLP Stop Time (ACUTE ONLY): 1411 SLP Time Calculation (min) (ACUTE ONLY): 21 min Past Medical History: Past Medical History: Diagnosis Date . Abnormality of gait 01/30/2015 . Anemia in CKD (chronic kidney disease)  . Anxiety  . Arthritis  . Asthma   On nebulizer . Atrial fibrillation, chronic ([place])   a. Rate controlled w/ BB. No OAC 2/2 falls and gait instability. . Bowel obstruction   Caused by scar tissue . Chronic cor pulmonale (HCC)   Most likely due to COPD (cannot rule out contribution of diastolic LV dysfunction). ECHO 07/30/11 pulmonary artery pressure was estimated to be 50-60% mmHg & there was moderate tricuspid insufficiency. . Chronic diastolic CHF (congestive heart failure) ([place])   a. Predominantly right heart failure due to cor pulmonale & in turn this is most likely due to untreated OSA;  b. 05/2013 Echo: EF 55-60%, no rwma, mild to mod MR, sev dil LA, mod to sev dil RV w/ reduced systolic fxn, massively dil RA, mod-sev TR, PASP 42mmHg. [name] Chronic pain syndrome  . Colon cancer ([place])   Carcinoma of sigmoid colon 1984 & 1985. [name] COPD (chronic obstructive pulmonary disease) ([place])  . Coronary artery disease   a. occluded proximal diagonal artery on angiography 2004 .  Depression  . Diverticulosis of colon  . 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 Dyspnea on exertion   Reluctant to take diuretics . Edema of both legs   Venous Doppler 05/11/12 was normal & no evidence of thrombus or thrombophlebitis. Chronic LE edema related to both cor pulmonale & lymphedema, hyperlipidemia & HTN involving coronary atherosclerosis by cardiac cath in 2004. Cath showed a tiny old occlusion of the optional diagonal branch & also proximal LAD. [name] Frequent falls   Possibly due to orthostatic hypotension but we have not seen that on our visits. Recent fall 05/2012 with scalp hematoma, swollen left LE, & black eyes. . Hyperlipidemia   On a statin. Catheterization 02/12/03 Showed a tiny old occlusion of the optional diagonal branch & also proximal LAD. [name] Hypertension  . Hypothyroidism 2002  On hormone replacement therapy .  IBS (irritable bowel syndrome)  . Insomnia  . Kidney failure 09/05/2015  Pt states that she went to the doctor on thursday and was told she is in stage 4 kidney failure . Lung nodule  . Milroy's disease   Lymphatic disorder diagnosed at [facility] . Obstructive sleep apnea   Polysomnogram 09/25/11 AHI: 53.6/hr overall & 57.1/hr during REM sleep. Poor compliance previously with CPAP . Pulmonary hypertension   Pullmonary hypertension, chronic cor pulmonale w/pulmonary artery pressures of 50&#x2011; 60 mmHg. . Tuberculosis  Past Surgical History: Past Surgical History: Procedure Laterality Date . ABDOMINAL HYSTERECTOMY  1983 . APPENDECTOMY  1956 . CARDIAC CATHETERIZATION  02/12/03  Showed a tiny old occlusion of the optional diagonal branch & also proximal LAD. [name] CHOLECYSTECTOMY  1984 . COLON BIOPSY   . COLON RESECTION    2 times . COLONOSCOPY  07/23/10 HPI: Robbye B Burnettis a 82 y.o.femalewith history of diastolic CHF, COPD, OSA, colon cancer,hypertension, chronic lymphedema CAD chronic kidney disease A. fib presents to the ER because of shortness of breath and productive cough and frequent falls.  Initial ER physician note the patient was confused for which CT head was negative for acute intracranial process. Per note pt admitted for bronchitis and lower extremity cellulitis. CXR Continued improvement in left lower lobe airspace disease. MD ordered ST eval due to desat to 70's on 02 and baseline cough (cxr ordered 1/7). No Data Recorded Assessment / Plan / Recommendation CHL IP CLINICAL IMPRESSIONS 02/25/2016 Therapy Diagnosis Suspected primary esophageal dysphagia;Mild pharyngeal phase dysphagia Clinical Impression Pt exhibited a suspected primary esophageal dysphagia marked byf esophageal column full of barium after 3 trials thin and puree. Esophageal residue reached the level of the patients cervical esophagus with questionable entrace to pyriform sinuses. Suspect pt will or has been penetrating/aspirating post prandial especially due to pt's suboptimal trunk and head positioning. Decreased pharyngeal sensation due to swallow initiation at the level of the pyriforms; mild vallecular residue. Pt will likely aspirate all consistencies. SLP spoke with MD. If po's are continued, thin liquid diet would be recommended over solids or purees due to esophageal residue. ST will follow up next date for decision re: food/liquid. Pt may be a candidate for Palliative care given results of MBS.  Impact on safety and function Severe aspiration risk Some recent data might be hidden   CHL IP TREATMENT RECOMMENDATION 02/25/2016 Treatment Recommendations Therapy as outlined in treatment plan below Some recent data might be hidden   Prognosis 02/25/2016 Prognosis for Safe Diet Advancement Fair Barriers to Reach Goals -- Barriers/Prognosis Comment -- Some recent data might be hidden CHL IP DIET RECOMMENDATION 02/25/2016 SLP Diet Recommendations NPO Liquid Administration via -- Medication Administration -- Compensations -- Postural Changes -- Some recent data might be hidden   No flowsheet data found.  CHL IP FOLLOW UP RECOMMENDATIONS  02/25/2016 Follow up Recommendations None Some recent data might be hidden   CHL IP FREQUENCY AND DURATION 02/25/2016 Speech Therapy Frequency (ACUTE ONLY) min 1 x/week Treatment Duration 1 week Some recent data might be hidden      CHL IP ORAL PHASE 02/25/2016 Oral Phase WFL Oral - Pudding Teaspoon -- Oral - Pudding Cup -- Oral - Honey Teaspoon -- Oral - Honey Cup -- Oral - Nectar Teaspoon -- Oral - Nectar Cup -- Oral - Nectar Straw -- Oral - Thin Teaspoon -- Oral - Thin Cup -- Oral - Thin Straw -- Oral - Puree -- Oral - Mech Soft -- Oral - Regular -- Oral -  Multi-Consistency -- Oral - Pill -- Oral Phase - Comment -- Some recent data might be hidden  [facility] IP PHARYNGEAL PHASE 02/25/2016 Pharyngeal Phase Impaired Pharyngeal- Pudding Teaspoon -- Pharyngeal -- Pharyngeal- Pudding Cup -- Pharyngeal -- Pharyngeal- Honey Teaspoon -- Pharyngeal -- Pharyngeal- Honey Cup -- Pharyngeal -- Pharyngeal- Nectar Teaspoon -- Pharyngeal -- Pharyngeal- Nectar Cup -- Pharyngeal -- Pharyngeal- Nectar Straw -- Pharyngeal -- Pharyngeal- Thin Teaspoon -- Pharyngeal -- Pharyngeal- Thin Cup -- Pharyngeal -- Pharyngeal- Thin Straw Delayed swallow initiation-pyriform sinuses;Pharyngeal residue - valleculae Pharyngeal -- Pharyngeal- Puree Delayed swallow initiation-vallecula Pharyngeal -- Pharyngeal- Mechanical Soft -- Pharyngeal -- Pharyngeal- Regular -- Pharyngeal -- Pharyngeal- Multi-consistency -- Pharyngeal -- Pharyngeal- Pill -- Pharyngeal -- Pharyngeal Comment -- Some recent data might be hidden  [facility] IP CERVICAL ESOPHAGEAL PHASE 02/25/2016 Cervical Esophageal Phase WFL Pudding Teaspoon -- Pudding Cup -- Honey Teaspoon -- Honey Cup -- Nectar Teaspoon -- Nectar Cup -- Nectar Straw -- Thin Teaspoon -- Thin Cup -- Thin Straw -- Puree -- Mechanical Soft -- Regular -- Multi-consistency -- Pill -- Cervical Esophageal Comment -- Some recent data might be hidden No flowsheet data found. [place] Siren 02/25/2016, 4:30 PM    Orbie Pyo Colvin Caroli.Ed  [facility] Pager [phone]            ASSESSMENT/PLAN:  Generalized weakness - for rehabilitation, PT and OT, for therapeutic strengthening exercises; fall precautions  Acute respiratory failure with hypoxia - multifactorial, likely due to influenza with acute bronchitis, COPD, pulmonary edema and aspiration; was given Narcan on 1/9 with improvement in mental status, was placed on BiPAP, she was started on Steve voids and discharged on prednisone, was put on meropenem and discharged on Levaquin  Aspiration pneumonia - was on meropenem, declined feeding tube,  on dysphagia diet and discharged on Levaquin 5 more days; for palliative consult for goals of care  Acute encephalopathy - secondary to Ativan, avoid sedative drugs  Hypernatremia -  Na problem poor by mouth intake, check BMP on 03/10/16  Dysphagia - for speech therapy evaluation and treatment of swallowing functions; aspiration precautions; declined feeding tubes; on dysphagia 3 diet  Acute on chronic diastolic CHF - she was given IV Lasix in the hospital causing worsening of her renal function so diuretics were held, and O2 at 2 L/minute  Chronic atrial fibrillation - rate controlled; continue metoprolol, not a candidate for anticoagulation due to recurrent falls  Essential hypertension - continue metoprolol  Acute on chronic kidney disease stage III - creatinine 0.95, GFR 55; will recheck BMP on 03/10/16  Thrombocytopenia - platelet 91; monitor for bleeding; check CBC on 03/10/16  Influenza A - completed Tamiflu 5 days  Hypothyroidism - continue Synthroid; check TSH in 1 month Lab Results  Component Value Date   TSH 12.767 (H) 02/19/2016   Recurrent falls - left scalp hematoma by CT; for rehabilitation, PT and OT, for therapeutic strengthening exercises; fall precautions  Chronic lymphedema - on no bruits were recently removed, keep skin clean and dry  Chronic pain syndrome - well-controlled; continue morphine 10 mg/0.5 mL by  mouth twice a day and when necessary  Constipation - start senna S 2 tabs by mouth twice a day and MiraLAX 17 g by mouth daily     Goals of care:  Short-term rehabilitation    Jakalyn Kratky C. Mulhall - NP [employer] [phone]

## 2016-03-10 ENCOUNTER — Non-Acute Institutional Stay (SKILLED_NURSING_FACILITY): Payer: Medicare Other | Admitting: Internal Medicine

## 2016-03-10 ENCOUNTER — Other Ambulatory Visit: Payer: Self-pay | Admitting: Licensed Clinical Social Worker

## 2016-03-10 ENCOUNTER — Encounter: Payer: Self-pay | Admitting: Internal Medicine

## 2016-03-10 DIAGNOSIS — I482 Chronic atrial fibrillation, unspecified: Secondary | ICD-10-CM

## 2016-03-10 DIAGNOSIS — K5909 Other constipation: Secondary | ICD-10-CM

## 2016-03-10 DIAGNOSIS — D696 Thrombocytopenia, unspecified: Secondary | ICD-10-CM

## 2016-03-10 DIAGNOSIS — E870 Hyperosmolality and hypernatremia: Secondary | ICD-10-CM | POA: Diagnosis not present

## 2016-03-10 DIAGNOSIS — N183 Chronic kidney disease, stage 3 unspecified: Secondary | ICD-10-CM

## 2016-03-10 DIAGNOSIS — R110 Nausea: Secondary | ICD-10-CM | POA: Diagnosis not present

## 2016-03-10 DIAGNOSIS — D638 Anemia in other chronic diseases classified elsewhere: Secondary | ICD-10-CM | POA: Diagnosis not present

## 2016-03-10 DIAGNOSIS — J42 Unspecified chronic bronchitis: Secondary | ICD-10-CM

## 2016-03-10 DIAGNOSIS — R1310 Dysphagia, unspecified: Secondary | ICD-10-CM | POA: Diagnosis not present

## 2016-03-10 DIAGNOSIS — J9621 Acute and chronic respiratory failure with hypoxia: Secondary | ICD-10-CM | POA: Diagnosis not present

## 2016-03-10 DIAGNOSIS — R531 Weakness: Secondary | ICD-10-CM

## 2016-03-10 DIAGNOSIS — E039 Hypothyroidism, unspecified: Secondary | ICD-10-CM | POA: Diagnosis not present

## 2016-03-10 DIAGNOSIS — R627 Adult failure to thrive: Secondary | ICD-10-CM | POA: Diagnosis not present

## 2016-03-10 NOTE — Patient Outreach (Signed)
[facility]) Care Management  [name] Social Work  03/10/2016  Victoria Fisher 07-01-1934 [id number]    Encounter Medications:  Outpatient Encounter Prescriptions as of 03/10/2016  Medication Sig  . acetaminophen (TYLENOL) 160 MG/5ML solution Take 20.3 mLs (650 mg total) by mouth 3 (three) times daily.  [name] docusate sodium (COLACE) 100 MG capsule Take 100 mg by mouth 2 (two) times daily.  [name] ipratropium-albuterol (DUONEB) 0.5-2.5 (3) MG/3ML SOLN Take 3 mLs by nebulization every 2 (two) hours as needed.  [name] ipratropium-albuterol (DUONEB) 0.5-2.5 (3) MG/3ML SOLN Take 3 mLs by nebulization 3 (three) times daily.  [name] levofloxacin (LEVAQUIN) 500 MG tablet Take 1 tablet (500 mg total) by mouth daily.  [name] levothyroxine (SYNTHROID, LEVOTHROID) 112 MCG tablet Take 1 tablet (112 mcg total) by mouth every morning.  . metoprolol succinate (TOPROL-XL) 50 MG 24 hr tablet Take 50 mg by mouth daily. Take with or immediately following a meal.  . mometasone (NASONEX) 50 MCG/ACT nasal spray Place 2 sprays into the nose daily as needed (congestion).  . morphine (MSIR) 15 MG tablet Take 1 tablet (15 mg total) by mouth 3 (three) times daily as needed (breakthrough pain).  . Morphine Sulfate (MORPHINE CONCENTRATE) 10 MG/0.5ML SOLN concentrated solution Take 0.5 mLs (10 mg total) by mouth 3 (three) times daily.  [name] NUTRITIONAL SUPPLEMENT LIQD Take by mouth 2 (two) times daily. [employer]  . polyethylene glycol (MIRALAX / GLYCOLAX) packet Take 17 g by mouth 2 (two) times daily as needed (constipation). Mix in 8 oz liquid and drink  . predniSONE (DELTASONE) 50 MG tablet Take 1 tablet (50 mg total) by mouth daily with breakfast.  . Probiotic Product (PROBIOTIC PO) Take 1 tablet by mouth daily. Florastor  . senna (SENOKOT) 8.6 MG TABS tablet Take 2-3 tablets by mouth daily as needed for mild constipation.   . vitamin E 400 UNIT capsule Take 400 Units by mouth daily.   No facility-administered encounter  medications on file as of 03/10/2016.     Functional Status:  In your present state of health, do you have any difficulty performing the following activities: 02/19/2016 02/19/2016  Hearing? N -  Vision? N -  Difficulty concentrating or making decisions? N -  Walking or climbing stairs? Y -  Dressing or bathing? Y -  Doing errands, shopping? Y N  Preparing Food and eating ? - -  Using the Toilet? - -  In the past six months, have you accidently leaked urine? - -  Do you have problems with loss of bowel control? - -  Managing your Medications? - -  Managing your Finances? - -  Some recent data might be hidden    Fall/Depression Screening:  PHQ 2/9 Scores 03/10/2016 01/20/2016 01/10/2016  PHQ - 2 Score 0 0 0  Some recent data might be hidden    Assessment: CSW completed SNF visit today on 03/10/16 at [name] with both patient and patient's spouse who are both residents at SNF currently. Patient is able to answer assessment questions appropriately at this time. She shares that she is doing well and "loves being here at [name]." She reports that she is sleeping well at night. She admits to having a low appetite. She reports that she continues to experience ongoing pain in her both of her legs. She denies experiencing any depressive symptoms. She reports that her son passed away last year due to health problems but that she is not in need of grief or mental health  resources. Patient denies needing to make any changes to her advance directives. Patient's family has concerns in regards to patient's "manipulative behavior" and her use of opiates and medications that are not hers. She has been discharged from multiple home health companies due to "stubborn" behavior and not wanting to accept help or care for her safety. Prior to recent admission, patient and spouse were receiving at [name] and had a caregiver. Patient's spouse was admitted into the hospital for his advanced dementia and  then was transferred to [name]. Patient and spouse room together at SNF.   CSW completed call to patient's son and he answered. He reports that [name] is no longer an option for them and that his nephew (patient's grandson) is assisting with placement at [name] for both patient and spouse. He had no other updates.  CSW spoke to SNF discharge planner and she informed CSW that [name] has came to facility and visited spouse and he will be discharging there this week. She reports that she sent FL2 out yesterday. She states that patient will be going to [name] as well once she is ready to discharge from SNF.   Plan: CSW will route this encounter to PCP. CSW will follow up with SNF within two weeks.  THN CM Care Plan Problem One   Flowsheet Row Most Recent Value  Care Plan Problem One  SNF admission  Role Documenting the Problem One  Clinical Social Worker  Care Plan for Problem One  Active  THN Long Term Goal (31-90 days)  Patient will have a safe and stable discharge from SNF within 90 days as evidenced by self report or by family report  [place] Term Goal Start Date  03/10/16  Interventions for Problem One Long Term Goal  CSW completed SNF visit and provided [name]. CSW will coordinate care with patient, family and SNF d/c planner. Family is seeking LTC placement at [facility] at this time for both patient and spouse. CSW will ensure that patient has a safe discharge there with needed services, resources and equipment.  THN CM Short Term Goal #1 (0-30 days)  Patient will attend all scheduled physical therapy within the next 30 days as evidenced by self report  THN CM Short Term Goal #1 Start Date  03/10/16  Interventions for Short Term Goal #1  CSW completed home visit and provided education on the importance of physical therapy. CSW provided emotional support and encouragement to assist with patient's motivation to attend all scheduled physical  therapy appointments.     [name], BSW, MSW, [email or web address] Phone: [phone] Fax: [phone]

## 2016-03-10 NOTE — Progress Notes (Signed)
LOCATION: [facility]  PCP: Haywood Pao, MD   Code Status: Full Code  Goals of care: Advanced Directive information Advanced Directives 03/10/2016  Does Patient Have a Medical Advance Directive? Yes  Type of Advance Directive Living will  Does patient want to make changes to medical advance directive? No - Patient declined  Copy of [place] in Chart? No - copy requested  Would patient like information on creating a medical advance directive? -  Pre-existing out of facility DNR order (yellow form or pink MOST form) -  Some recent data might be hidden       Extended Emergency Contact Information Primary Emergency Contact: Victoria, Fisher Address: [address]          [place], [place] [address] Victoria Fisher Phone: [phone] Mobile Phone: [phone] Relation: Spouse Secondary Emergency Contact: Fisher,Victoria Address: [address] [place]          [place], [place] [address] [place] Phone: [phone] Relation: Other   Allergies  Allergen Reactions  . Codeine Anaphylaxis, Hives and Nausea And Vomiting  . Gabapentin Swelling and Other (See Comments)    Hallucinations, severe facial swelling   . Other Other (See Comments)    Will not accept blood or blood products - patient is Jehovah's Witness  . Ciprofloxacin Hives and Nausea And Vomiting  . Latex Hives and Rash  . Penicillins Hives    Has patient had a PCN reaction causing immediate rash, facial/tongue/throat swelling, SOB or lightheadedness with hypotension: Yes Has patient had a PCN reaction causing severe rash involving mucus membranes or skin necrosis: Yes Has patient had a PCN reaction that required hospitalization Yes Has patient had a PCN reaction occurring within the last 10 years: Yes If all of the above answers are "NO", then may proceed with Cephalosporin use.  . Sulfonamide Derivatives Hives and Rash    Chief Complaint  Patient presents with    . New Admit To SNF    New Admission Visit      HPI:  Patient is a 82 y.o. female seen today for short term rehabilitation post hospital admission from 03/03/2016-03/06/2016 with acute respiratory failure from acute bronchitis, aspiration pneumonia and pulmonary edema. She required Narcan, BiPAP, steroids and antibiotics. She has medical history of hypertension, COPD, atrial fibrillation among others. She was also noted to have dysphagia and was seen by speech therapy in the hospital and placed on dysphagia diet. She is seen in her room today.  Review of Systems:  Constitutional: Negative for fever, chills, diaphoresis. Feels weak and tired.  HENT: Negative for headache, congestion, nasal discharge, sore throat. Positive for difficulty swallowing. Eyes: Negative for eye pain, blurred vision, double vision and discharge.  Respiratory: Negative for cough, shortness of breath and wheezing.   Cardiovascular: Negative for chest pain, palpitations, leg swelling.  Gastrointestinal: Negative for heartburn, nausea, vomiting, abdominal pain, loss of appetite.  Genitourinary: Negative for dysuria. Positive for leg pain.  Musculoskeletal: Negative for back pain, fall.  Skin: Negative for itching, rash.  Neurological: Positive for dizziness with change of position.Victoria Fisher Psychiatric/Behavioral: Negative for depression   Past Medical History:  Diagnosis Date  . Abnormality of gait 01/30/2015  . Anemia in CKD (chronic kidney disease)   . Anxiety   . Arthritis   . Asthma    On nebulizer  . Atrial fibrillation, chronic ([place])    a. Rate controlled w/ BB. No OAC 2/2 falls and gait instability.  Victoria Fisher  Bowel obstruction    Caused by scar tissue  . Chronic cor pulmonale (HCC)    Most likely due to COPD (cannot rule out contribution of diastolic LV dysfunction). ECHO 07/30/11 pulmonary artery pressure was estimated to be 50-60% mmHg & there was moderate tricuspid insufficiency.  . Chronic diastolic CHF  (congestive heart failure) ([place])    a. Predominantly right heart failure due to cor pulmonale & in turn this is most likely due to untreated OSA;  b. 05/2013 Echo: EF 55-60%, no rwma, mild to mod MR, sev dil LA, mod to sev dil RV w/ reduced systolic fxn, massively dil RA, mod-sev TR, PASP 39mmHg.  Victoria Fisher Chronic pain syndrome   . Colon cancer ([place])    Carcinoma of sigmoid colon 1984 & 1985.  Victoria Fisher COPD (chronic obstructive pulmonary disease) ([place])   . Coronary artery disease    a. occluded proximal diagonal artery on angiography 2004  . Depression   . Diverticulosis of colon   . 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 Dyspnea on exertion    Reluctant to take diuretics  . Edema of both legs    Venous Doppler 05/11/12 was normal & no evidence of thrombus or thrombophlebitis. Chronic LE edema related to both cor pulmonale & lymphedema, hyperlipidemia & HTN involving coronary atherosclerosis by cardiac cath in 2004. Cath showed a tiny old occlusion of the optional diagonal branch & also proximal LAD.  Victoria Fisher Frequent falls    Possibly due to orthostatic hypotension but we have not seen that on our visits. Recent fall 05/2012 with scalp hematoma, swollen left LE, & black eyes.  . Hyperlipidemia    On a statin. Catheterization 02/12/03 Showed a tiny old occlusion of the optional diagonal branch & also proximal LAD.  Victoria Fisher Hypertension   . Hypothyroidism 2002   On hormone replacement therapy  . IBS (irritable bowel syndrome)   . Insomnia   . Kidney failure 09/05/2015   Pt states that she went to the doctor on thursday and was told she is in stage 4 kidney failure  . Lung nodule   . 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 Obstructive sleep apnea    Polysomnogram 09/25/11 AHI: 53.6/hr overall & 57.1/hr during REM sleep. Poor compliance previously with CPAP  . Pulmonary hypertension    Pullmonary hypertension, chronic cor pulmonale w/pulmonary artery pressures of 50&#x2011; 60 mmHg.  Victoria Fisher Refusal of  blood transfusions as patient is Jehovah's Witness   . Tuberculosis    Past Surgical History:  Procedure Laterality Date  . ABDOMINAL HYSTERECTOMY  1983  . APPENDECTOMY  1956  . CARDIAC CATHETERIZATION  02/12/03   Showed a tiny old occlusion of the optional diagonal branch & also proximal LAD.  Victoria Fisher CHOLECYSTECTOMY  1984  . COLON BIOPSY    . COLON RESECTION     2 times  . COLONOSCOPY  07/23/10   Social History:   reports that she has never smoked. She has never used smokeless tobacco. She reports that she does not drink alcohol or use drugs.  Family History  Problem Relation Age of Onset  . Heart disease Father   . Alzheimer's disease Mother   . Parkinsonism Brother   . Diabetes Brother     Medications: Allergies as of 03/10/2016      Reactions   Codeine Anaphylaxis, Hives, Nausea And Vomiting   Gabapentin Swelling, Other (See Comments)   Hallucinations, severe facial swelling   Other Other (See Comments)  Will not accept blood or blood products - patient is Jehovah's Witness   Ciprofloxacin Hives, Nausea And Vomiting   Latex Hives, Rash   Penicillins Hives   Has patient had a PCN reaction causing immediate rash, facial/tongue/throat swelling, SOB or lightheadedness with hypotension: Yes Has patient had a PCN reaction causing severe rash involving mucus membranes or skin necrosis: Yes Has patient had a PCN reaction that required hospitalization Yes Has patient had a PCN reaction occurring within the last 10 years: Yes If all of the above answers are "NO", then may proceed with Cephalosporin use.   Sulfonamide Derivatives Hives, Rash      Medication List       Accurate as of 03/10/16  2:33 PM. Always use your most recent med list.          acetaminophen 325 MG tablet Commonly known as:  TYLENOL Take 650 mg by mouth 3 (three) times daily.   ipratropium-albuterol 0.5-2.5 (3) MG/3ML Soln Commonly known as:  DUONEB Take 3 mLs by nebulization every 2 (two) hours as  needed.   ipratropium-albuterol 0.5-2.5 (3) MG/3ML Soln Commonly known as:  DUONEB Take 3 mLs by nebulization 3 (three) times daily.   levofloxacin 500 MG tablet Commonly known as:  LEVAQUIN Take 1 tablet (500 mg total) by mouth daily.   levothyroxine 112 MCG tablet Commonly known as:  SYNTHROID, LEVOTHROID Take 1 tablet (112 mcg total) by mouth every morning.   metoprolol succinate 50 MG 24 hr tablet Commonly known as:  TOPROL-XL Take 50 mg by mouth daily. Take with or immediately following a meal.   mometasone 50 MCG/ACT nasal spray Commonly known as:  NASONEX Place 2 sprays into the nose daily as needed (congestion).   morphine CONCENTRATE 10 MG/0.5ML Soln concentrated solution Take 0.5 mLs (10 mg total) by mouth 3 (three) times daily.   morphine 15 MG tablet Commonly known as:  MSIR Take 1 tablet (15 mg total) by mouth 3 (three) times daily as needed (breakthrough pain).   NUTRITIONAL SUPPLEMENT Liqd Take 120 mLs by mouth 2 (two) times daily. MedPass   polyethylene glycol packet Commonly known as:  MIRALAX / GLYCOLAX Take 17 g by mouth daily. Mix in 8 oz liquid and drink   predniSONE 50 MG tablet Commonly known as:  DELTASONE Take 1 tablet (50 mg total) by mouth daily with breakfast.   PROBIOTIC PO Take 1 tablet by mouth daily. Florastor   senna 8.6 MG Tabs tablet Commonly known as:  SENOKOT Take 2 tablets by mouth 2 (two) times daily.   vitamin E 400 UNIT capsule Take 400 Units by mouth daily.       Immunizations: Immunization History  Administered Date(s) Administered  . Influenza,inj,Quad PF,36+ Mos 11/15/2015  . Tdap 07/22/2015, 10/29/2015     Physical Exam: Vitals:   03/10/16 1429  BP: 121/84  Pulse: 66  Resp: 18  Temp: 97.2 F (36.2 C)  TempSrc: Oral  SpO2: 98%  Weight: 158 lb 6.4 oz (71.8 kg)   Body mass index is 27.19 kg/m.  General- elderly female, well built, in no acute distress Head- normocephalic, atraumatic Throat- moist  mucus membrane Eyes- PERRLA, EOMI, no pallor, no icterus Neck- no cervical lymphadenopathy Cardiovascular- Irregular heart rate, systolic murmur present Respiratory- decreased air entry bilaterally, no wheeze, no rhonchi, no crackles, no use of accessory muscles Abdomen- bowel sounds present, soft, non tender Musculoskeletal- able to move all 4 extremities, generalized weakness, leg edema present Neurological- alert and oriented to person,  place and time Skin- warm and dry, bruise to left side of her face and her arms Psychiatry- normal mood and affect    Labs reviewed: Basic Metabolic Panel:  Recent Labs  09/12/15 0912  11/16/15 0619 11/17/15 0605  12/18/15 1131  03/02/16 0214 03/04/16 0258 03/05/16 0223  NA 141  < > 140 142  142  < > 137  < > 147* 146* 146*  K 3.6  < > 4.6 3.9  4.0  < > 3.2*  < > 3.9 3.9 4.5  CL 105  < > 101 100*  100*  < > 92*  < > 97* 106 104  CO2 28  < > 34* 34*  34*  < > 34*  < > 34* 34* 35*  GLUCOSE 115*  < > 97 84  86  < > 93  < > 110* 160* 122*  BUN 40*  < > 32* 31*  30*  < > 84*  < > 84* 97* 84*  CREATININE 1.38*  < > 1.49* 1.36*  1.41*  < > 1.88*  < > 1.37* 1.17* 0.95  CALCIUM 9.4  < > 9.6 10.0  10.0  < > 9.7  < > 10.4* 9.7 9.9  MG 2.4  --   --   --   --  2.5  --   --   --   --   PHOS  --   --  2.7 2.4*  --   --   --   --   --   --   < > = values in this interval not displayed. Liver Function Tests:  Recent Labs  01/31/16 0922 02/18/16 2220 02/19/16 0533  AST 27 50* 54*  ALT 11* 16 15  ALKPHOS 63 70 63  BILITOT 0.8 1.4* 1.5*  PROT 6.9 7.2 6.5  ALBUMIN 3.4* 3.3* 3.1*    Recent Labs  01/31/16 0922  LIPASE 19   No results for input(s): AMMONIA in the last 8760 hours. CBC:  Recent Labs  02/18/16 2220 02/19/16 0533  02/21/16 [id number]  02/28/16 0554 03/05/16 0223 03/06/16 0322  WBC 3.8* 3.7*  < > 4.7  < > 10.4 7.9 7.4  NEUTROABS 2.6 2.3  --  3.3  --   --   --   --   HGB 12.8 12.0  < > 12.1  < > 14.0 10.6* 10.3*  HCT 39.0  37.4  < > 39.0  < > 43.7 35.6* 34.2*  MCV 101.0* 100.3*  < > 104.0*  < > 101.2* 107.2* 107.5*  PLT 73* 90*  < > 83*  < > 120* 99* 91*  < > = values in this interval not displayed. Cardiac Enzymes:  Recent Labs  09/09/15 1821  02/19/16 1226 02/19/16 1840 02/26/16 0951  CKTOTAL 244*  --   --   --   --   TROPONINI  --   < > 0.04* 0.04* <0.03  < > = values in this interval not displayed. BNP: Invalid input(s): POCBNP CBG:  Recent Labs  09/13/15 0754 09/13/15 1302 01/31/16 0924  GLUCAP 79 87 81    Radiological Exams: Dg Chest 2 View  Result Date: 02/23/2016 CLINICAL DATA:  Left lower lobe pneumonia. EXAM: CHEST  2 VIEW COMPARISON:  PA and lateral chest 02/21/2014 and 02/19/2014. FINDINGS: Left lower lobe airspace disease continues to improve. There is marked cardiomegaly without edema. Right lung is clear. No pneumothorax or pleural fluid. Aortic atherosclerosis noted. IMPRESSION: Continued improvement in left  lower lobe airspace disease. Cardiomegaly without edema. Atherosclerosis. Electronically Signed   By: Inge Rise M.D.   On: 02/23/2016 12:40   Dg Chest 2 View  Result Date: 02/22/2016 CLINICAL DATA:  Bronchitis and volume overload. EXAM: CHEST  2 VIEW COMPARISON:  February 20, 2016 FINDINGS: The left retrocardiac opacity seen previously has improved. No overt edema. Stable cardiomegaly. IMPRESSION: Improving left retrocardiac opacity.  No overt edema. Electronically Signed   By: Dorise Bullion III M.D   On: 02/22/2016 17:31   Dg Chest 2 View  Result Date: 02/20/2016 CLINICAL DATA:  82 year old female with history of pneumonia. Followup study. EXAM: CHEST  2 VIEW COMPARISON:  Chest x-ray 02/18/2016. FINDINGS: Lung volumes are normal. Retrocardiac opacity obscuring portions of the left hemidiaphragm, concerning for left lower lobe pneumonia. Possible trace left pleural effusion. Right lung is clear. No pneumothorax. Cephalization of the pulmonary vasculature, without frank  pulmonary edema. Mild cardiomegaly. The patient is rotated to the left on today's exam, resulting in distortion of the mediastinal contours and reduced diagnostic sensitivity and specificity for mediastinal pathology. Aortic atherosclerosis. IMPRESSION: 1. Findings are concerning for left lower lobe pneumonia, likely with trace left pleural effusion. Followup PA and lateral chest X-ray is recommended in 3-4 weeks following trial of antibiotic therapy to ensure resolution and exclude underlying malignancy. 2. Cardiomegaly with pulmonary venous congestion, but no frank pulmonary edema. 3. Aortic atherosclerosis. Electronically Signed   By: Vinnie Langton M.D.   On: 02/20/2016 13:50   Dg Chest 2 View  Result Date: 02/18/2016 CLINICAL DATA:  Fever and flu like symptoms EXAM: CHEST  2 VIEW COMPARISON:  02/05/2016 FINDINGS: Moderate cardiomegaly with globular cardiac configuration as before. Atherosclerosis of the aorta. No acute consolidation or effusion. No pneumothorax. IMPRESSION: 1. Moderate globular enlargement of the cardiac silhouette without overt failure 2. No acute infiltrates Electronically Signed   By: Donavan Foil M.D.   On: 02/18/2016 22:56   Ct Head Wo Contrast  Result Date: 02/19/2016 CLINICAL DATA:  Wheezing, fever. History of hypertension, frequent falls, colon cancer. EXAM: CT HEAD WITHOUT CONTRAST TECHNIQUE: Contiguous axial images were obtained from the base of the skull through the vertex without intravenous contrast. COMPARISON:  CT HEAD January 31, 2016 FINDINGS: BRAIN: The ventricles and sulci are normal for age. No intraparenchymal hemorrhage, mass effect nor midline shift. Patchy supratentorial white matter hypodensities less than expected for patient's age, though non-specific are most compatible with chronic small vessel ischemic disease. No acute large vascular territory infarcts. No abnormal extra-axial fluid collections. Basal cisterns are patent. VASCULAR: Mild to moderate  calcific atherosclerosis of the carotid siphons. SKULL: No skull fracture. Moderate LEFT frontoparietal scalp hematomas. No subcutaneous gas or radiopaque foreign bodies. LEFT facial and periorbital soft tissue swelling compatible with contusion. SINUSES/ORBITS: Mild paranasal sinus mucosal thickening. Mastoid air cells are well aerated. Status post bilateral ocular lens implants. The included ocular globes and orbital contents are non-suspicious. OTHER: None. IMPRESSION: Multiple scalp hematomas and LEFT facial contusion. No skull fracture. No acute intracranial process ; negative stable examination for age. Electronically Signed   By: Elon Alas M.D.   On: 02/19/2016 00:46   Dg Pelvis Portable  Result Date: 02/19/2016 CLINICAL DATA:  RIGHT leg pain, recent fall EXAM: PORTABLE PELVIS 1-2 VIEWS COMPARISON:  Portable exam 1008 hours compared to 11/18/2014 FINDINGS: Rotated to the LEFT. Osseous demineralization. Hip joints symmetric and preserved. No definite fracture, dislocation, or bone destruction. IMPRESSION: Osseous demineralization without acute bony abnormalities identified on rotated exam.  Electronically Signed   By: Lavonia Dana M.D.   On: 02/19/2016 10:21   Dg Chest Portable 1 View  Result Date: 02/27/2016 CLINICAL DATA:  82 year old female with shortness of Breath. COPD, cor pulmonale. Initial encounter. EXAM: PORTABLE CHEST 1 VIEW COMPARISON:  02/26/2016 and earlier. FINDINGS: Portable AP semi upright view at 0423 hours. The patient remains rotated to the left. Stable cardiomegaly and mediastinal contours. Calcified aortic atherosclerosis. Interval decreased pulmonary vascularity. No acute edema. Regressed platelike perihilar opacity. No pneumothorax. No definite pleural effusion. No areas of worsening ventilation. IMPRESSION: 1. Interval improved ventilation with regressed pulmonary vascular congestion and perihilar atelectasis. 2. Stable cardiomegaly.  Calcified aortic atherosclerosis.  Electronically Signed   By: Genevie Ann M.D.   On: 02/27/2016 06:58   Dg Chest Port 1 View  Result Date: 02/26/2016 CLINICAL DATA:  Shortness of breath and respiratory distress EXAM: PORTABLE CHEST 1 VIEW COMPARISON:  Chest radiograph 02/25/2016 FINDINGS: Cardiomegaly can't aortic atherosclerosis are unchanged. Interstitial opacities in the right lung have worsened. Suspected lingular atelectasis has increased. No pneumothorax or sizable pleural effusion. IMPRESSION: Massive cardiomegaly, aortic atherosclerosis and slightly worsening pulmonary edema. Electronically Signed   By: Ulyses Jarred M.D.   On: 02/26/2016 03:30   Dg Chest Port 1 View  Result Date: 02/25/2016 CLINICAL DATA:  Asthma-COPD. Respiratory distress. History of atrial fibrillation, CHF, pulmonary hypertension, colonic malignancy. EXAM: PORTABLE CHEST 1 VIEW COMPARISON:  Chest x-ray of February 23, 2016 FINDINGS: The lungs are adequately inflated. The interstitial markings on the right are more conspicuous today. The cardiac silhouette remains enlarged. The pulmonary vascularity is not engorged. A small right pleural effusion has developed. There is calcification in the wall of the aortic arch. The observed bony thorax is unremarkable. IMPRESSION: Patchy increased interstitial density on the right may reflect early interstitial pneumonia or asymmetric pulmonary edema new small right pleural effusion. Stable cardiomegaly. Thoracic aortic atherosclerosis. Electronically Signed   By: David  Martinique M.D.   On: 02/25/2016 07:04   Dg Swallowing Func-speech Pathology  Result Date: 02/25/2016 Objective Swallowing Evaluation: Type of Study: MBS-Modified Barium Swallow Study Patient Details Name: Victoria Fisher MRN: [id number] Date of Birth: [birth year] Today's Date: 02/25/2016 Time: SLP Start Time (ACUTE ONLY): 1350-SLP Stop Time (ACUTE ONLY): 1411 SLP Time Calculation (min) (ACUTE ONLY): 21 min Past Medical History: Past Medical History: Diagnosis Date .  Abnormality of gait 01/30/2015 . Anemia in CKD (chronic kidney disease)  . Anxiety  . Arthritis  . Asthma   On nebulizer . Atrial fibrillation, chronic ([place])   a. Rate controlled w/ BB. No OAC 2/2 falls and gait instability. . Bowel obstruction   Caused by scar tissue . Chronic cor pulmonale (HCC)   Most likely due to COPD (cannot rule out contribution of diastolic LV dysfunction). ECHO 07/30/11 pulmonary artery pressure was estimated to be 50-60% mmHg & there was moderate tricuspid insufficiency. . Chronic diastolic CHF (congestive heart failure) ([place])   a. Predominantly right heart failure due to cor pulmonale & in turn this is most likely due to untreated OSA;  b. 05/2013 Echo: EF 55-60%, no rwma, mild to mod MR, sev dil LA, mod to sev dil RV w/ reduced systolic fxn, massively dil RA, mod-sev TR, PASP 71mmHg. Victoria Fisher Chronic pain syndrome  . Colon cancer ([place])   Carcinoma of sigmoid colon 1984 & 1985. Victoria Fisher COPD (chronic obstructive pulmonary disease) ([place])  . Coronary artery disease   a. occluded proximal diagonal artery on angiography 2004 . Depression  . Diverticulosis  of colon  . 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 Dyspnea on exertion   Reluctant to take diuretics . Edema of both legs   Venous Doppler 05/11/12 was normal & no evidence of thrombus or thrombophlebitis. Chronic LE edema related to both cor pulmonale & lymphedema, hyperlipidemia & HTN involving coronary atherosclerosis by cardiac cath in 2004. Cath showed a tiny old occlusion of the optional diagonal branch & also proximal LAD. Victoria Fisher Frequent falls   Possibly due to orthostatic hypotension but we have not seen that on our visits. Recent fall 05/2012 with scalp hematoma, swollen left LE, & black eyes. . Hyperlipidemia   On a statin. Catheterization 02/12/03 Showed a tiny old occlusion of the optional diagonal branch & also proximal LAD. Victoria Fisher Hypertension  . Hypothyroidism 2002  On hormone replacement therapy . IBS (irritable bowel syndrome)  . Insomnia  . Kidney  failure 09/05/2015  Pt states that she went to the doctor on thursday and was told she is in stage 4 kidney failure . Lung nodule  . Milroy's disease   Lymphatic [facility] at [facility] . Obstructive sleep apnea   Polysomnogram 09/25/11 AHI: 53.6/hr overall & 57.1/hr during REM sleep. Poor compliance previously with CPAP . Pulmonary hypertension   Pullmonary hypertension, chronic cor pulmonale w/pulmonary artery pressures of 50&#x2011; 60 mmHg. . Tuberculosis  Past Surgical History: Past Surgical History: Procedure Laterality Date . ABDOMINAL HYSTERECTOMY  1983 . APPENDECTOMY  1956 . CARDIAC CATHETERIZATION  02/12/03  Showed a tiny old occlusion of the optional diagonal branch & also proximal LAD. Victoria Fisher CHOLECYSTECTOMY  1984 . COLON BIOPSY   . COLON RESECTION    2 times . COLONOSCOPY  07/23/10 HPI: Illyria B Burnettis a 82 y.o.femalewith history of diastolic CHF, COPD, OSA, colon cancer,hypertension, chronic lymphedema CAD chronic kidney disease A. fib presents to the ER because of shortness of breath and productive cough and frequent falls. Initial ER physician note the patient was confused for which CT head was negative for acute intracranial process. Per note pt admitted for bronchitis and lower extremity cellulitis. CXR Continued improvement in left lower lobe airspace disease. MD ordered ST eval due to desat to 70's on 02 and baseline cough (cxr ordered 1/7). No Data Recorded Assessment / Plan / Recommendation CHL IP CLINICAL IMPRESSIONS 02/25/2016 Therapy Diagnosis Suspected primary esophageal dysphagia;Mild pharyngeal phase dysphagia Clinical Impression Pt exhibited a suspected primary esophageal dysphagia marked byf esophageal column full of barium after 3 trials thin and puree. Esophageal residue reached the level of the patients cervical esophagus with questionable entrace to pyriform sinuses. Suspect pt will or has been penetrating/aspirating post prandial especially due to pt's suboptimal trunk and head  positioning. Decreased pharyngeal sensation due to swallow initiation at the level of the pyriforms; mild vallecular residue. Pt will likely aspirate all consistencies. SLP spoke with MD. If po's are continued, thin liquid diet would be recommended over solids or purees due to esophageal residue. ST will follow up next date for decision re: food/liquid. Pt may be a candidate for Palliative care given results of MBS.  Impact on safety and function Severe aspiration risk Some recent data might be hidden   CHL IP TREATMENT RECOMMENDATION 02/25/2016 Treatment Recommendations Therapy as outlined in treatment plan below Some recent data might be hidden   Prognosis 02/25/2016 Prognosis for Safe Diet Advancement Fair Barriers to Reach Goals -- Barriers/Prognosis Comment -- Some recent data might be hidden [facility] IP DIET RECOMMENDATION 02/25/2016 SLP Diet Recommendations NPO Liquid Administration via -- Medication Administration --  Compensations -- Postural Changes -- Some recent data might be hidden   No flowsheet data found.  [facility] IP FOLLOW UP RECOMMENDATIONS 02/25/2016 Follow up Recommendations None Some recent data might be hidden   [facility] IP FREQUENCY AND DURATION 02/25/2016 Speech Therapy Frequency (ACUTE ONLY) min 1 x/week Treatment Duration 1 week Some recent data might be hidden      [facility] IP ORAL PHASE 02/25/2016 Oral Phase WFL Oral - Pudding Teaspoon -- Oral - Pudding Cup -- Oral - Honey Teaspoon -- Oral - Honey Cup -- Oral - Nectar Teaspoon -- Oral - Nectar Cup -- Oral - Nectar Straw -- Oral - Thin Teaspoon -- Oral - Thin Cup -- Oral - Thin Straw -- Oral - Puree -- Oral - Mech Soft -- Oral - Regular -- Oral - Multi-Consistency -- Oral - Pill -- Oral Phase - Comment -- Some recent data might be hidden  [facility] IP PHARYNGEAL PHASE 02/25/2016 Pharyngeal Phase Impaired Pharyngeal- Pudding Teaspoon -- Pharyngeal -- Pharyngeal- Pudding Cup -- Pharyngeal -- Pharyngeal- Honey Teaspoon -- Pharyngeal -- Pharyngeal- Honey Cup -- Pharyngeal --  Pharyngeal- Nectar Teaspoon -- Pharyngeal -- Pharyngeal- Nectar Cup -- Pharyngeal -- Pharyngeal- Nectar Straw -- Pharyngeal -- Pharyngeal- Thin Teaspoon -- Pharyngeal -- Pharyngeal- Thin Cup -- Pharyngeal -- Pharyngeal- Thin Straw Delayed swallow initiation-pyriform sinuses;Pharyngeal residue - valleculae Pharyngeal -- Pharyngeal- Puree Delayed swallow initiation-vallecula Pharyngeal -- Pharyngeal- Mechanical Soft -- Pharyngeal -- Pharyngeal- Regular -- Pharyngeal -- Pharyngeal- Multi-consistency -- Pharyngeal -- Pharyngeal- Pill -- Pharyngeal -- Pharyngeal Comment -- Some recent data might be hidden  [facility] IP CERVICAL ESOPHAGEAL PHASE 02/25/2016 Cervical Esophageal Phase WFL Pudding Teaspoon -- Pudding Cup -- Honey Teaspoon -- Honey Cup -- Nectar Teaspoon -- Nectar Cup -- Nectar Straw -- Thin Teaspoon -- Thin Cup -- Thin Straw -- Puree -- Mechanical Soft -- Regular -- Multi-consistency -- Pill -- Cervical Esophageal Comment -- Some recent data might be hidden No flowsheet data found. [place] Siren 02/25/2016, 4:30 PM    Orbie Pyo Colvin Caroli.Ed CCC-SLP Pager [phone]            Assessment/Plan  Generalized weakness With physical deconditioning from recent hospitalization due to acute on chronic respiratory failure. Will get palliative care consult. Will have patient work with PT/OT as tolerated to regain strength and restore function.  Fall precautions are in place.  Acute on chronic respiratory failure Continue and complete course of prednisone and Levaquin. Continue oxygen by nasal cannula for now and bronchodilators. Monitor her breathing. Get palliative care consult. Continue morphine 15 mg 3 times a day as needed for dyspnea and pain.  COPD Advanced With recent acute bronchitis. Continue and complete course of prednisone and Levaquin. Continue her oxygen. Palliative care consult.   Dysphagia SLP to follow. Aspiration precautions to be taken. Continue dysphagia diet  Nausea Start her on  Zofran 4 mg every 8 hours as needed for nausea monitor. Monitor for any vomiting.   Anemia of chronic disease Monitor CBC periodically   Hypernatremia Monitor BMP, maintain hydration   Thrombocytopenia Monitor platelet count  Chronic constipation Continue Senokot-S 2 tablets twice a day  Hypothyroidism Continue Synthroid, no changes made  Atrial fibrillation Not on any anticoagulation because of high fall risk. Continue Toprol-XL 50 mg daily  ckd stage 3 Monitor bmp      Goals of care: short term rehabilitation, possible long term    Family/ staff Communication: reviewed care plan with patient and nursing supervisor    Blanchie Serve, MD Internal  [facility] [place], [place] [address] Cell Phone (Monday-Friday 8 am - 5 pm): [phone] On Call: [phone] and follow prompts after 5 pm and on weekends Office Phone: [phone] Office Fax: [phone]

## 2016-03-11 LAB — CBC AND DIFFERENTIAL
HCT: 32 % — AB (ref 36–46)
Hemoglobin: 10.1 g/dL — AB (ref 12.0–16.0)
Platelets: 151 10*3/uL (ref 150–399)
WBC: 14.9 10*3/mL

## 2016-03-11 LAB — BASIC METABOLIC PANEL
BUN: 40 mg/dL — AB (ref 4–21)
Creatinine: 1 mg/dL (ref 0.5–1.1)
Glucose: 76 mg/dL
POTASSIUM: 3.9 mmol/L (ref 3.4–5.3)
Sodium: 140 mmol/L (ref 137–147)

## 2016-03-12 ENCOUNTER — Encounter: Payer: Self-pay | Admitting: Adult Health

## 2016-03-12 ENCOUNTER — Non-Acute Institutional Stay (SKILLED_NURSING_FACILITY): Payer: Medicare Other | Admitting: Adult Health

## 2016-03-12 DIAGNOSIS — D72829 Elevated white blood cell count, unspecified: Secondary | ICD-10-CM | POA: Diagnosis not present

## 2016-03-12 DIAGNOSIS — I5033 Acute on chronic diastolic (congestive) heart failure: Secondary | ICD-10-CM | POA: Diagnosis not present

## 2016-03-12 DIAGNOSIS — J189 Pneumonia, unspecified organism: Secondary | ICD-10-CM | POA: Diagnosis not present

## 2016-03-12 DIAGNOSIS — K5901 Slow transit constipation: Secondary | ICD-10-CM | POA: Diagnosis not present

## 2016-03-12 NOTE — Progress Notes (Signed)
Patient ID: RENONA MCNAB, female   DOB: December 22, 1934, 82 y.o.   MRN: [id number]    DATE:    03/12/16  MRN:  [id number]  BIRTHDAY: 1934-12-26  Facility:  Nursing Home Location:  [place] Room Number: 1003-A  LEVEL OF CARE:  SNF (31)  Contact Information    Name Relation Home Work Mobile   [place] E Spouse [phone]  [phone]   Shiela Mayer   [phone]   [facility] Daughter   [phone]   Ria Comment [phone]         Code Status History    Date Active Date Inactive Code Status Order ID Comments User Context   02/19/2016  5:17 AM 03/06/2016  5:20 PM Full Code [id number]  Rise Patience, MD ED   01/05/2016  9:57 PM 01/09/2016  4:42 PM Full Code [id number]  Ivor Costa, MD ED   11/14/2015  8:38 PM 11/17/2015  2:19 PM Full Code [id number]  Karmen Bongo, MD Inpatient   09/09/2015  9:31 PM 09/13/2015  9:44 PM Full Code [id number]  Ivor Costa, MD ED   07/07/2013  6:19 AM 07/11/2013  2:32 PM Full Code [id number]  Haywood Pao, MD Inpatient   06/08/2013  6:52 AM 06/11/2013  7:18 PM Full Code [id number]  Berle Mull, MD Inpatient       Chief Complaint  Patient presents with  . Acute Visit    Edema, constipation, pneumonia    HISTORY OF PRESENT ILLNESS:  This is a 31-YO female who was noted to have wbc 14.9 . No reported fever. She completed Levaquin course yesterday for PNA. Chest x-ray showed left basilar infiltrate. She was seen in the room and complained of constipation. She was noted to have +3 BLE edema.  She has CHF but Lasix was held due to elevation in creatinine.   She was admitted to [facility] on 03/06/2016 following an admission at [facility] 02/18/2016-03/05/2016 for acute bronchitis. She continued to decline and was given a dose of Narcan with improvement in mental statu then changed to Levaquins. She had respiratory failure with hypoxia and was placed on BiPAP. She had wheezing and was placed on  solu-medrol then changed to Prednisone. She was treated for pneumonia with Meropenem then changed to Levaquin.  She has PMH of chronic diastolic CHF, cor pulmonale, hypertension, chronic lymphedema, CAD, chronic kidney disease, atrial fibrillation, chronic pain syndrome, COPD, frequent falls, hypothyroidism and OSA.   PAST MEDICAL HISTORY:  Past Medical History:  Diagnosis Date  . Abnormality of gait 01/30/2015  . Anemia in CKD (chronic kidney disease)   . Anxiety   . Arthritis   . Asthma    On nebulizer  . Atrial fibrillation, chronic ([place])    a. Rate controlled w/ BB. No OAC 2/2 falls and gait instability.  . Bowel obstruction    Caused by scar tissue  . Chronic cor pulmonale (HCC)    Most likely due to COPD (cannot rule out contribution of diastolic LV dysfunction). ECHO 07/30/11 pulmonary artery pressure was estimated to be 50-60% mmHg & there was moderate tricuspid insufficiency.  . Chronic diastolic CHF (congestive heart failure) ([place])    a. Predominantly right heart failure due to cor pulmonale & in turn this is most likely due to untreated OSA;  b. 05/2013 Echo: EF 55-60%, no rwma, mild to mod MR, sev dil LA, mod to sev dil RV w/ reduced systolic fxn, massively dil RA, mod-sev TR, PASP 9mmHg.  [name]  Chronic pain syndrome   . Colon cancer ([place])    Carcinoma of sigmoid colon 1984 & 1985.  [name] COPD (chronic obstructive pulmonary disease) ([place])   . Coronary artery disease    a. occluded proximal diagonal artery on angiography 2004  . Depression   . Diverticulosis of colon   . 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 Dyspnea on exertion    Reluctant to take diuretics  . Edema of both legs    Venous Doppler 05/11/12 was normal & no evidence of thrombus or thrombophlebitis. Chronic LE edema related to both cor pulmonale & lymphedema, hyperlipidemia & HTN involving coronary atherosclerosis by cardiac cath in 2004. Cath showed a tiny old occlusion of the optional diagonal branch & also proximal  LAD.  [name] Frequent falls    Possibly due to orthostatic hypotension but we have not seen that on our visits. Recent fall 05/2012 with scalp hematoma, swollen left LE, & black eyes.  . Hyperlipidemia    On a statin. Catheterization 02/12/03 Showed a tiny old occlusion of the optional diagonal branch & also proximal LAD.  [name] Hypertension   . Hypothyroidism 2002   On hormone replacement therapy  . IBS (irritable bowel syndrome)   . Insomnia   . Kidney failure 09/05/2015   Pt states that she went to the doctor on thursday and was told she is in stage 4 kidney failure  . Lung nodule   . Milroy's disease    Lymphatic disorder [facility] at [facility]  . Obstructive sleep apnea    Polysomnogram 09/25/11 AHI: 53.6/hr overall & 57.1/hr during REM sleep. Poor compliance previously with CPAP  . Pulmonary hypertension    Pullmonary hypertension, chronic cor pulmonale w/pulmonary artery pressures of 50&#x2011; 60 mmHg.  [name] Refusal of blood transfusions as patient is Jehovah's Witness   . Tuberculosis      CURRENT MEDICATIONS: Reviewed  Patient's Medications  New Prescriptions   No medications on file  Previous Medications   ACETAMINOPHEN (TYLENOL) 325 MG TABLET    Take 650 mg by mouth 3 (three) times daily.   BISACODYL (DULCOLAX) 10 MG SUPPOSITORY    Place 10 mg rectally daily as needed for moderate constipation. Give 1 now and then qd prn   DOXYCYCLINE (VIBRA-TABS) 100 MG TABLET    Take 100 mg by mouth 2 (two) times daily. x10 days   FUROSEMIDE (LASIX) 40 MG TABLET    Take 40 mg by mouth daily.   IPRATROPIUM-ALBUTEROL (DUONEB) 0.5-2.5 (3) MG/3ML SOLN    Take 3 mLs by nebulization every 2 (two) hours as needed.   IPRATROPIUM-ALBUTEROL (DUONEB) 0.5-2.5 (3) MG/3ML SOLN    Take 3 mLs by nebulization 3 (three) times daily.   LACTULOSE (CHRONULAC) 10 GM/15ML SOLUTION    Take 20 g by mouth 2 (two) times daily as needed for mild constipation.   LEVOTHYROXINE (SYNTHROID, LEVOTHROID) 112 MCG TABLET    Take 1 tablet  (112 mcg total) by mouth every morning.   METOPROLOL SUCCINATE (TOPROL-XL) 50 MG 24 HR TABLET    Take 50 mg by mouth daily. Take with or immediately following a meal.   MOMETASONE (NASONEX) 50 MCG/ACT NASAL SPRAY    Place 2 sprays into the nose daily as needed (congestion).   MORPHINE SULFATE (MORPHINE CONCENTRATE) 10 MG/0.5ML SOLN CONCENTRATED SOLUTION    Take 0.5 mLs (10 mg total) by mouth 3 (three) times daily.   NUTRITIONAL SUPPLEMENT LIQD    Take 120 mLs by mouth 2 (two) times daily. [employer]  ONDANSETRON (ZOFRAN) 4 MG TABLET    Take 4 mg by mouth every 8 (eight) hours as needed for nausea or vomiting.   POLYETHYLENE GLYCOL (MIRALAX / GLYCOLAX) PACKET    Take 17 g by mouth daily. Mix in 8 oz liquid and drink   POTASSIUM CHLORIDE (K-DUR) 10 MEQ TABLET    Take 10 mEq by mouth daily.   SACCHAROMYCES BOULARDII (FLORASTOR) 250 MG CAPSULE    Take 250 mg by mouth daily. x13 days   SENNOSIDES-DOCUSATE SODIUM (SENOKOT-S) 8.6-50 MG TABLET    Take 2 tablets by mouth 2 (two) times daily.   VITAMIN E 400 UNIT CAPSULE    Take 400 Units by mouth daily.  Modified Medications   Modified Medication Previous Medication   MORPHINE (MSIR) 15 MG TABLET morphine (MSIR) 15 MG tablet      Take 1 tablet (15 mg total) by mouth 3 (three) times daily as needed (breakthrough pain).    Take 1 tablet (15 mg total) by mouth 3 (three) times daily as needed (breakthrough pain).  Discontinued Medications   LEVOFLOXACIN (LEVAQUIN) 500 MG TABLET    Take 1 tablet (500 mg total) by mouth daily.   PREDNISONE (DELTASONE) 50 MG TABLET    Take 1 tablet (50 mg total) by mouth daily with breakfast.   PROBIOTIC PRODUCT (PROBIOTIC PO)    Take 1 tablet by mouth daily. Florastor   SENNA (SENOKOT) 8.6 MG TABS TABLET    Take 2 tablets by mouth 2 (two) times daily.      Allergies  Allergen Reactions  . Codeine Anaphylaxis, Hives and Nausea And Vomiting  . Gabapentin Swelling and Other (See Comments)    Hallucinations, severe facial  swelling   . Other Other (See Comments)    Will not accept blood or blood products - patient is Jehovah's Witness  . Ciprofloxacin Hives and Nausea And Vomiting  . Latex Hives and Rash  . Penicillins Hives    Has patient had a PCN reaction causing immediate rash, facial/tongue/throat swelling, SOB or lightheadedness with hypotension: Yes Has patient had a PCN reaction causing severe rash involving mucus membranes or skin necrosis: Yes Has patient had a PCN reaction that required hospitalization Yes Has patient had a PCN reaction occurring within the last 10 years: Yes If all of the above answers are "NO", then may proceed with Cephalosporin use.  . Sulfonamide Derivatives Hives and Rash     REVIEW OF SYSTEMS:  GENERAL: no change in appetite, no fatigue, no weight changes, no fever, chills or weakness EYES: Denies change in vision, dry eyes, eye pain, itching or discharge EARS: Denies change in hearing, ringing in ears, or earache NOSE: Denies nasal congestion or epistaxis MOUTH and THROAT: Denies oral discomfort, gingival pain or bleeding, pain from teeth or hoarseness   RESPIRATORY: no SOB, DOE, wheezing, hemoptysis, +cough CARDIAC: no chest pain, or palpitations GI: no abdominal pain, diarrhea, +constipation, heart burn, nausea or vomiting GU: Denies dysuria, frequency, hematuria, incontinence, or discharge PSYCHIATRIC: Denies feeling of depression or anxiety. No report of hallucinations, insomnia, paranoia, or agitation     PHYSICAL EXAMINATION  GENERAL APPEARANCE: Well nourished. In no acute distress. Normal body habitus SKIN:  Right lower leg with dry scaly skin, bruising on that side of face HEAD: Normal in size and contour. No evidence of trauma EYES: Lids open and close normally. No blepharitis, entropion or ectropion. PERRL. Conjunctivae are clear and sclerae are white. Lenses are without opacity EARS: Pinnae are normal. Patient hears  normal voice tunes of the  examiner MOUTH and THROAT: Lips are without lesions. Oral mucosa is moist and without lesions. Tongue is normal in shape, size, and color and without lesions NECK: supple, trachea midline, no neck masses, no thyroid tenderness, no thyromegaly LYMPHATICS: no LAN in the neck, no supraclavicular LAN RESPIRATORY: breathing is even & unlabored, BS CTAB, O2 @ 2L/min via [place] CARDIAC: RRR, no murmur,no extra heart sounds, BLE 3+ edema GI: abdomen soft, normal BS, no masses, no tenderness, no hepatomegaly, no splenomegaly EXTREMITIES:  Able to move X 4 extremities PSYCHIATRIC: Alert and oriented X 3. Affect and behavior are appropriate    LABS/RADIOLOGY: Labs reviewed: Basic Metabolic Panel:  Recent Labs  09/12/15 0912  11/16/15 0619 11/17/15 0605  12/18/15 1131  03/02/16 0214 03/04/16 0258 03/05/16 0223 03/11/16  NA 141  < > 140 142  142  < > 137  < > 147* 146* 146* 140  K 3.6  < > 4.6 3.9  4.0  < > 3.2*  < > 3.9 3.9 4.5 3.9  CL 105  < > 101 100*  100*  < > 92*  < > 97* 106 104  --   CO2 28  < > 34* 34*  34*  < > 34*  < > 34* 34* 35*  --   GLUCOSE 115*  < > 97 84  86  < > 93  < > 110* 160* 122*  --   BUN 40*  < > 32* 31*  30*  < > 84*  < > 84* 97* 84* 40*  CREATININE 1.38*  < > 1.49* 1.36*  1.41*  < > 1.88*  < > 1.37* 1.17* 0.95 1.0  CALCIUM 9.4  < > 9.6 10.0  10.0  < > 9.7  < > 10.4* 9.7 9.9  --   MG 2.4  --   --   --   --  2.5  --   --   --   --   --   PHOS  --   --  2.7 2.4*  --   --   --   --   --   --   --   < > = values in this interval not displayed. Liver Function Tests:  Recent Labs  01/31/16 0922 02/18/16 2220 02/19/16 0533  AST 27 50* 54*  ALT 11* 16 15  ALKPHOS 63 70 63  BILITOT 0.8 1.4* 1.5*  PROT 6.9 7.2 6.5  ALBUMIN 3.4* 3.3* 3.1*    Recent Labs  01/31/16 0922  LIPASE 19   CBC:  Recent Labs  02/18/16 2220 02/19/16 0533  02/21/16 0614  02/28/16 0554 03/05/16 0223 03/06/16 0322 03/11/16  WBC 3.8* 3.7*  < > 4.7  < > 10.4 7.9 7.4 14.9   NEUTROABS 2.6 2.3  --  3.3  --   --   --   --   --   HGB 12.8 12.0  < > 12.1  < > 14.0 10.6* 10.3* 10.1*  HCT 39.0 37.4  < > 39.0  < > 43.7 35.6* 34.2* 32*  MCV 101.0* 100.3*  < > 104.0*  < > 101.2* 107.2* 107.5*  --   PLT 73* 90*  < > 83*  < > 120* 99* 91* 151  < > = values in this interval not displayed. Lipid Panel:  Recent Labs  09/10/15 0403  HDL 32*   Cardiac Enzymes:  Recent Labs  09/09/15 1821  02/19/16 1226 02/19/16 1840  02/26/16 0951  CKTOTAL 244*  --   --   --   --   TROPONINI  --   < > 0.04* 0.04* <0.03  < > = values in this interval not displayed. CBG:  Recent Labs  09/13/15 0754 09/13/15 1302 01/31/16 0924  GLUCAP 79 87 81      Dg Chest 2 View  Result Date: 02/23/2016 CLINICAL DATA:  Left lower lobe pneumonia. EXAM: CHEST  2 VIEW COMPARISON:  PA and lateral chest 02/21/2014 and 02/19/2014. FINDINGS: Left lower lobe airspace disease continues to improve. There is marked cardiomegaly without edema. Right lung is clear. No pneumothorax or pleural fluid. Aortic atherosclerosis noted. IMPRESSION: Continued improvement in left lower lobe airspace disease. Cardiomegaly without edema. Atherosclerosis. Electronically Signed   By: Inge Rise M.D.   On: 02/23/2016 12:40   Dg Chest 2 View  Result Date: 02/22/2016 CLINICAL DATA:  Bronchitis and volume overload. EXAM: CHEST  2 VIEW COMPARISON:  February 20, 2016 FINDINGS: The left retrocardiac opacity seen previously has improved. No overt edema. Stable cardiomegaly. IMPRESSION: Improving left retrocardiac opacity.  No overt edema. Electronically Signed   By: Dorise Bullion III M.D   On: 02/22/2016 17:31   Dg Chest 2 View  Result Date: 02/20/2016 CLINICAL DATA:  82 year old female with history of pneumonia. Followup study. EXAM: CHEST  2 VIEW COMPARISON:  Chest x-ray 02/18/2016. FINDINGS: Lung volumes are normal. Retrocardiac opacity obscuring portions of the left hemidiaphragm, concerning for left lower lobe  pneumonia. Possible trace left pleural effusion. Right lung is clear. No pneumothorax. Cephalization of the pulmonary vasculature, without frank pulmonary edema. Mild cardiomegaly. The patient is rotated to the left on today's exam, resulting in distortion of the mediastinal contours and reduced diagnostic sensitivity and specificity for mediastinal pathology. Aortic atherosclerosis. IMPRESSION: 1. Findings are concerning for left lower lobe pneumonia, likely with trace left pleural effusion. Followup PA and lateral chest X-ray is recommended in 3-4 weeks following trial of antibiotic therapy to ensure resolution and exclude underlying malignancy. 2. Cardiomegaly with pulmonary venous congestion, but no frank pulmonary edema. 3. Aortic atherosclerosis. Electronically Signed   By: Vinnie Langton M.D.   On: 02/20/2016 13:50   Dg Chest 2 View  Result Date: 02/18/2016 CLINICAL DATA:  Fever and flu like symptoms EXAM: CHEST  2 VIEW COMPARISON:  02/05/2016 FINDINGS: Moderate cardiomegaly with globular cardiac configuration as before. Atherosclerosis of the aorta. No acute consolidation or effusion. No pneumothorax. IMPRESSION: 1. Moderate globular enlargement of the cardiac silhouette without overt failure 2. No acute infiltrates Electronically Signed   By: Donavan Foil M.D.   On: 02/18/2016 22:56   Ct Head Wo Contrast  Result Date: 02/19/2016 CLINICAL DATA:  Wheezing, fever. History of hypertension, frequent falls, colon cancer. EXAM: CT HEAD WITHOUT CONTRAST TECHNIQUE: Contiguous axial images were obtained from the base of the skull through the vertex without intravenous contrast. COMPARISON:  CT HEAD January 31, 2016 FINDINGS: BRAIN: The ventricles and sulci are normal for age. No intraparenchymal hemorrhage, mass effect nor midline shift. Patchy supratentorial white matter hypodensities less than expected for patient's age, though non-specific are most compatible with chronic small vessel ischemic disease.  No acute large vascular territory infarcts. No abnormal extra-axial fluid collections. Basal cisterns are patent. VASCULAR: Mild to moderate calcific atherosclerosis of the carotid siphons. SKULL: No skull fracture. Moderate LEFT frontoparietal scalp hematomas. No subcutaneous gas or radiopaque foreign bodies. LEFT facial and periorbital soft tissue swelling compatible with contusion. SINUSES/ORBITS: Mild paranasal sinus mucosal  thickening. Mastoid air cells are well aerated. Status post bilateral ocular lens implants. The included ocular globes and orbital contents are non-suspicious. OTHER: None. IMPRESSION: Multiple scalp hematomas and LEFT facial contusion. No skull fracture. No acute intracranial process ; negative stable examination for age. Electronically Signed   By: Elon Alas M.D.   On: 02/19/2016 00:46   Dg Pelvis Portable  Result Date: 02/19/2016 CLINICAL DATA:  RIGHT leg pain, recent fall EXAM: PORTABLE PELVIS 1-2 VIEWS COMPARISON:  Portable exam 1008 hours compared to 11/18/2014 FINDINGS: Rotated to the LEFT. Osseous demineralization. Hip joints symmetric and preserved. No definite fracture, dislocation, or bone destruction. IMPRESSION: Osseous demineralization without acute bony abnormalities identified on rotated exam. Electronically Signed   By: Lavonia Dana M.D.   On: 02/19/2016 10:21   Dg Chest Portable 1 View  Result Date: 02/27/2016 CLINICAL DATA:  82 year old female with shortness of Breath. COPD, cor pulmonale. Initial encounter. EXAM: PORTABLE CHEST 1 VIEW COMPARISON:  02/26/2016 and earlier. FINDINGS: Portable AP semi upright view at 0423 hours. The patient remains rotated to the left. Stable cardiomegaly and mediastinal contours. Calcified aortic atherosclerosis. Interval decreased pulmonary vascularity. No acute edema. Regressed platelike perihilar opacity. No pneumothorax. No definite pleural effusion. No areas of worsening ventilation. IMPRESSION: 1. Interval improved  ventilation with regressed pulmonary vascular congestion and perihilar atelectasis. 2. Stable cardiomegaly.  Calcified aortic atherosclerosis. Electronically Signed   By: Genevie Ann M.D.   On: 02/27/2016 06:58   Dg Chest Port 1 View  Result Date: 02/26/2016 CLINICAL DATA:  Shortness of breath and respiratory distress EXAM: PORTABLE CHEST 1 VIEW COMPARISON:  Chest radiograph 02/25/2016 FINDINGS: Cardiomegaly can't aortic atherosclerosis are unchanged. Interstitial opacities in the right lung have worsened. Suspected lingular atelectasis has increased. No pneumothorax or sizable pleural effusion. IMPRESSION: Massive cardiomegaly, aortic atherosclerosis and slightly worsening pulmonary edema. Electronically Signed   By: Ulyses Jarred M.D.   On: 02/26/2016 03:30   Dg Chest Port 1 View  Result Date: 02/25/2016 CLINICAL DATA:  Asthma-COPD. Respiratory distress. History of atrial fibrillation, CHF, pulmonary hypertension, colonic malignancy. EXAM: PORTABLE CHEST 1 VIEW COMPARISON:  Chest x-ray of February 23, 2016 FINDINGS: The lungs are adequately inflated. The interstitial markings on the right are more conspicuous today. The cardiac silhouette remains enlarged. The pulmonary vascularity is not engorged. A small right pleural effusion has developed. There is calcification in the wall of the aortic arch. The observed bony thorax is unremarkable. IMPRESSION: Patchy increased interstitial density on the right may reflect early interstitial pneumonia or asymmetric pulmonary edema new small right pleural effusion. Stable cardiomegaly. Thoracic aortic atherosclerosis. Electronically Signed   By: David  Martinique M.D.   On: 02/25/2016 07:04   Dg Swallowing Func-speech Pathology  Result Date: 02/25/2016 Objective Swallowing Evaluation: Type of Study: MBS-Modified Barium Swallow Study Patient Details Name: TERITA KLEMME MRN: [id number] Date of Birth: [birth year] Today's Date: 02/25/2016 Time: SLP Start Time (ACUTE ONLY): 1350-SLP  Stop Time (ACUTE ONLY): 1411 SLP Time Calculation (min) (ACUTE ONLY): 21 min Past Medical History: Past Medical History: Diagnosis Date . Abnormality of gait 01/30/2015 . Anemia in CKD (chronic kidney disease)  . Anxiety  . Arthritis  . Asthma   On nebulizer . Atrial fibrillation, chronic ([place])   a. Rate controlled w/ BB. No OAC 2/2 falls and gait instability. . Bowel obstruction   Caused by scar tissue . Chronic cor pulmonale (HCC)   Most likely due to COPD (cannot rule out contribution of diastolic LV dysfunction). ECHO 07/30/11 pulmonary  artery pressure was estimated to be 50-60% mmHg & there was moderate tricuspid insufficiency. . Chronic diastolic CHF (congestive heart failure) ([place])   a. Predominantly right heart failure due to cor pulmonale & in turn this is most likely due to untreated OSA;  b. 05/2013 Echo: EF 55-60%, no rwma, mild to mod MR, sev dil LA, mod to sev dil RV w/ reduced systolic fxn, massively dil RA, mod-sev TR, PASP 63mmHg. [name] Chronic pain syndrome  . Colon cancer ([place])   Carcinoma of sigmoid colon 1984 & 1985. [name] COPD (chronic obstructive pulmonary disease) ([place])  . Coronary artery disease   a. occluded proximal diagonal artery on angiography 2004 . Depression  . Diverticulosis of colon  . 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 Dyspnea on exertion   Reluctant to take diuretics . Edema of both legs   Venous Doppler 05/11/12 was normal & no evidence of thrombus or thrombophlebitis. Chronic LE edema related to both cor pulmonale & lymphedema, hyperlipidemia & HTN involving coronary atherosclerosis by cardiac cath in 2004. Cath showed a tiny old occlusion of the optional diagonal branch & also proximal LAD. [name] Frequent falls   Possibly due to orthostatic hypotension but we have not seen that on our visits. Recent fall 05/2012 with scalp hematoma, swollen left LE, & black eyes. . Hyperlipidemia   On a statin. Catheterization 02/12/03 Showed a tiny old occlusion of the optional diagonal branch & also  proximal LAD. [name] Hypertension  . Hypothyroidism 2002  On hormone replacement therapy . IBS (irritable bowel syndrome)  . Insomnia  . Kidney failure 09/05/2015  Pt states that she went to the doctor on thursday and was told she is in stage 4 kidney failure . Lung nodule  . Milroy's disease   Lymphatic [facility] at [facility] . Obstructive sleep apnea   Polysomnogram 09/25/11 AHI: 53.6/hr overall & 57.1/hr during REM sleep. Poor compliance previously with CPAP . Pulmonary hypertension   Pullmonary hypertension, chronic cor pulmonale w/pulmonary artery pressures of 50&#x2011; 60 mmHg. . Tuberculosis  Past Surgical History: Past Surgical History: Procedure Laterality Date . ABDOMINAL HYSTERECTOMY  1983 . APPENDECTOMY  1956 . CARDIAC CATHETERIZATION  02/12/03  Showed a tiny old occlusion of the optional diagonal branch & also proximal LAD. [name] CHOLECYSTECTOMY  1984 . COLON BIOPSY   . COLON RESECTION    2 times . COLONOSCOPY  07/23/10 HPI: Annalisa B Burnettis a 82 y.o.femalewith history of diastolic CHF, COPD, OSA, colon cancer,hypertension, chronic lymphedema CAD chronic kidney disease A. fib presents to the ER because of shortness of breath and productive cough and frequent falls. Initial ER physician note the patient was confused for which CT head was negative for acute intracranial process. Per note pt admitted for bronchitis and lower extremity cellulitis. CXR Continued improvement in left lower lobe airspace disease. MD ordered ST eval due to desat to 70's on 02 and baseline cough (cxr ordered 1/7). No Data Recorded Assessment / Plan / Recommendation CHL IP CLINICAL IMPRESSIONS 02/25/2016 Therapy Diagnosis Suspected primary esophageal dysphagia;Mild pharyngeal phase dysphagia Clinical Impression Pt exhibited a suspected primary esophageal dysphagia marked byf esophageal column full of barium after 3 trials thin and puree. Esophageal residue reached the level of the patients cervical esophagus with questionable entrace  to pyriform sinuses. Suspect pt will or has been penetrating/aspirating post prandial especially due to pt's suboptimal trunk and head positioning. Decreased pharyngeal sensation due to swallow initiation at the level of the pyriforms; mild vallecular residue. Pt will likely aspirate all consistencies.  SLP spoke with MD. If po's are continued, thin liquid diet would be recommended over solids or purees due to esophageal residue. ST will follow up next date for decision re: food/liquid. Pt may be a candidate for Palliative care given results of MBS.  Impact on safety and function Severe aspiration risk Some recent data might be hidden   CHL IP TREATMENT RECOMMENDATION 02/25/2016 Treatment Recommendations Therapy as outlined in treatment plan below Some recent data might be hidden   Prognosis 02/25/2016 Prognosis for Safe Diet Advancement Fair Barriers to Reach Goals -- Barriers/Prognosis Comment -- Some recent data might be hidden CHL IP DIET RECOMMENDATION 02/25/2016 SLP Diet Recommendations NPO Liquid Administration via -- Medication Administration -- Compensations -- Postural Changes -- Some recent data might be hidden   No flowsheet data found.  CHL IP FOLLOW UP RECOMMENDATIONS 02/25/2016 Follow up Recommendations None Some recent data might be hidden   CHL IP FREQUENCY AND DURATION 02/25/2016 Speech Therapy Frequency (ACUTE ONLY) min 1 x/week Treatment Duration 1 week Some recent data might be hidden      CHL IP ORAL PHASE 02/25/2016 Oral Phase WFL Oral - Pudding Teaspoon -- Oral - Pudding Cup -- Oral - Honey Teaspoon -- Oral - Honey Cup -- Oral - Nectar Teaspoon -- Oral - Nectar Cup -- Oral - Nectar Straw -- Oral - Thin Teaspoon -- Oral - Thin Cup -- Oral - Thin Straw -- Oral - Puree -- Oral - Mech Soft -- Oral - Regular -- Oral - Multi-Consistency -- Oral - Pill -- Oral Phase - Comment -- Some recent data might be hidden  CHL IP PHARYNGEAL PHASE 02/25/2016 Pharyngeal Phase Impaired Pharyngeal- Pudding Teaspoon -- Pharyngeal  -- Pharyngeal- Pudding Cup -- Pharyngeal -- Pharyngeal- Honey Teaspoon -- Pharyngeal -- Pharyngeal- Honey Cup -- Pharyngeal -- Pharyngeal- Nectar Teaspoon -- Pharyngeal -- Pharyngeal- Nectar Cup -- Pharyngeal -- Pharyngeal- Nectar Straw -- Pharyngeal -- Pharyngeal- Thin Teaspoon -- Pharyngeal -- Pharyngeal- Thin Cup -- Pharyngeal -- Pharyngeal- Thin Straw Delayed swallow initiation-pyriform sinuses;Pharyngeal residue - valleculae Pharyngeal -- Pharyngeal- Puree Delayed swallow initiation-vallecula Pharyngeal -- Pharyngeal- Mechanical Soft -- Pharyngeal -- Pharyngeal- Regular -- Pharyngeal -- Pharyngeal- Multi-consistency -- Pharyngeal -- Pharyngeal- Pill -- Pharyngeal -- Pharyngeal Comment -- Some recent data might be hidden  CHL IP CERVICAL ESOPHAGEAL PHASE 02/25/2016 Cervical Esophageal Phase WFL Pudding Teaspoon -- Pudding Cup -- Honey Teaspoon -- Honey Cup -- Nectar Teaspoon -- Nectar Cup -- Nectar Straw -- Thin Teaspoon -- Thin Cup -- Thin Straw -- Puree -- Mechanical Soft -- Regular -- Multi-consistency -- Pill -- Cervical Esophageal Comment -- Some recent data might be hidden No flowsheet data found. [place] Siren 02/25/2016, 4:30 PM    Orbie Pyo Colvin Caroli.Ed CCC-SLP Pager [phone]            ASSESSMENT/PLAN:  HCAP - start doxycycline 100 mg by mouth twice a day 10 days and Florastor 250 mg 1 capsule by mouth twice a day 13 days  Constipation - start Dulcolax 10 mg dose of 31 and direct bili now then daily when necessary, lactulose 10 g/15 mL give 30 mL by mouth twice a day when necessary and increase MiraLAX 17 g by mouth twice a day  Acute on chronic diastolic CHF - creatinine 0.95, improved; start Lasix 40 mg 1 tab by mouth daily; BMP on 03/16/16  Leukocytosis - wbc 14.9; no fever, CXR showed left basilar infiltrate, will start on Doxycycline    Adair Lemar [facility] [phone]

## 2016-03-13 ENCOUNTER — Other Ambulatory Visit: Payer: Self-pay | Admitting: *Deleted

## 2016-03-13 MED ORDER — MORPHINE SULFATE 15 MG PO TABS: 15.0000 mg | ORAL_TABLET | Freq: Three times a day (TID) | ORAL | 0 refills | Status: DC | PRN

## 2016-03-13 NOTE — Telephone Encounter (Signed)
[facility] #[phone] Fax: [phone] 

## 2016-03-16 ENCOUNTER — Other Ambulatory Visit: Payer: Self-pay | Admitting: Licensed Clinical Social Worker

## 2016-03-16 LAB — BASIC METABOLIC PANEL
BUN: 25 mg/dL — AB (ref 4–21)
CREATININE: 0.9 mg/dL (ref 0.5–1.1)
GLUCOSE: 81 mg/dL
POTASSIUM: 4 mmol/L (ref 3.4–5.3)
SODIUM: 138 mmol/L (ref 137–147)

## 2016-03-16 NOTE — Patient Outreach (Signed)
[place] [facility]) Care Management  03/16/2016  BIRKLEY VARAS 1934-07-11 [id number]  Assessment-CSW completed outreach attempt today to Carney Harder, SNF social worker at [facility]. CSW unable to reach her successfully. CSW left a HIPPA compliant voice message encouraging Levada Dy to return call once available in order to provide SNF updates.   Plan-CSW will await return call or complete an additional outreach if needed within one week.  [name], BSW, MSW, [email or web address] Phone: [phone] Fax: [phone]

## 2016-03-17 DIAGNOSIS — R110 Nausea: Secondary | ICD-10-CM | POA: Diagnosis not present

## 2016-03-18 LAB — BASIC METABOLIC PANEL
BUN: 29 mg/dL — AB (ref 4–21)
CREATININE: 1 mg/dL (ref 0.5–1.1)
Glucose: 76 mg/dL
Potassium: 4.1 mmol/L (ref 3.4–5.3)
Sodium: 139 mmol/L (ref 137–147)

## 2016-03-20 ENCOUNTER — Other Ambulatory Visit: Payer: Self-pay | Admitting: Licensed Clinical Social Worker

## 2016-03-20 NOTE — Patient Outreach (Addendum)
[facility]) Care Management  03/20/2016  MARCEA BRIEGER 10-Aug-1934 [id number]   Assessment- CSW completed outreach call to SNF and confirmed that patient are still at SNF. CSW has not heard back from SNF discharge planner. CSW was unable to reach SNF discharge planner today but left another HIPPA compliant voice message encouraging return call.  CSW completed call to patient's son [name]. [name] answered. He reports that patient seems to be doing very well at SNF. He states "I think she will be at [facility] for another two weeks and then she WILL transtion to [name]." Patient's spouse is already at [facility] facility. [name] reports that patient will be private pay. Son states that patient' spouse is adjusting very well to [place] facility.   Plan-CSW will await for return call or complete an additional outreach to SNF within one week.  [name], BSW, MSW, [email or web address] Phone: [phone] Fax: [phone]

## 2016-03-23 ENCOUNTER — Other Ambulatory Visit: Payer: Self-pay | Admitting: Licensed Clinical Social Worker

## 2016-03-23 NOTE — Patient Outreach (Signed)
[place] [facility]) Care Management  03/23/2016  ZERUIAH HAMIDI 04-03-1934 [id number]   Assessment- CSW received return voice message from SNF social worker on 03/23/16. SNF social worker stated that she has not been notified of any planned discharge date for patient and that physical therapy continues to work with patient. She stated that she had no further updates.   Plan-CSW will continue to follow up weekly and assist with transition to LTC facility after SNF discharge.  [name], BSW, MSW, [email or web address] Phone: [phone] Fax: [phone]

## 2016-03-26 ENCOUNTER — Encounter (HOSPITAL_COMMUNITY): Payer: Self-pay

## 2016-03-26 ENCOUNTER — Telehealth (HOSPITAL_COMMUNITY): Payer: Self-pay | Admitting: Cardiology

## 2016-03-26 ENCOUNTER — Other Ambulatory Visit: Payer: Self-pay | Admitting: Licensed Clinical Social Worker

## 2016-03-26 LAB — BASIC METABOLIC PANEL
BUN: 43 mg/dL — AB (ref 4–21)
CREATININE: 1.1 mg/dL (ref 0.5–1.1)
GLUCOSE: 81 mg/dL
POTASSIUM: 4.5 mmol/L (ref 3.4–5.3)
Sodium: 140 mmol/L (ref 137–147)

## 2016-03-26 NOTE — Telephone Encounter (Signed)
Patient was No Show for her 03/26/16 appt with Dr. Aundra Dubin.  Called and spoke with patient's caregiver, [name], who states pt is now at [facility] and caregiver forgot about the appt for today.  Ms. Victoria Fisher will contact [facility] to find out their procedure for transporting patients to their appts.  Ms. Victoria Fisher will either call us back to reschedule appt or have [name] call us.

## 2016-03-26 NOTE — Patient Outreach (Signed)
[place] [facility]) Care Management  [facility] Social Work  03/26/2016  ESA CADD 12-18-1934 [id number]   Encounter Medications:  Outpatient Encounter Prescriptions as of 03/26/2016  Medication Sig  . acetaminophen (TYLENOL) 325 MG tablet Take 650 mg by mouth 3 (three) times daily.  . bisacodyl (DULCOLAX) 10 MG suppository Place 10 mg rectally daily as needed for moderate constipation. Give 1 now and then qd prn  . doxycycline (VIBRA-TABS) 100 MG tablet Take 100 mg by mouth 2 (two) times daily. x10 days  . furosemide (LASIX) 40 MG tablet Take 40 mg by mouth daily.  [name] ipratropium-albuterol (DUONEB) 0.5-2.5 (3) MG/3ML SOLN Take 3 mLs by nebulization every 2 (two) hours as needed.  [name] ipratropium-albuterol (DUONEB) 0.5-2.5 (3) MG/3ML SOLN Take 3 mLs by nebulization 3 (three) times daily.  [name] lactulose (CHRONULAC) 10 GM/15ML solution Take 20 g by mouth 2 (two) times daily as needed for mild constipation.  [name] levothyroxine (SYNTHROID, LEVOTHROID) 112 MCG tablet Take 1 tablet (112 mcg total) by mouth every morning.  . metoprolol succinate (TOPROL-XL) 50 MG 24 hr tablet Take 50 mg by mouth daily. Take with or immediately following a meal.  . mometasone (NASONEX) 50 MCG/ACT nasal spray Place 2 sprays into the nose daily as needed (congestion).  . morphine (MSIR) 15 MG tablet Take 1 tablet (15 mg total) by mouth 3 (three) times daily as needed (breakthrough pain).  . Morphine Sulfate (MORPHINE CONCENTRATE) 10 MG/0.5ML SOLN concentrated solution Take 0.5 mLs (10 mg total) by mouth 3 (three) times daily.  [name] NUTRITIONAL SUPPLEMENT LIQD Take 120 mLs by mouth 2 (two) times daily. Med[employer]   . ondansetron (ZOFRAN) 4 MG tablet Take 4 mg by mouth every 8 (eight) hours as needed for nausea or vomiting.  . polyethylene glycol (MIRALAX / GLYCOLAX) packet Take 17 g by mouth daily. Mix in 8 oz liquid and drink  . potassium chloride (K-DUR) 10 MEQ tablet Take 10 mEq by mouth daily.  [name] saccharomyces  boulardii (FLORASTOR) 250 MG capsule Take 250 mg by mouth daily. x13 days  . sennosides-docusate sodium (SENOKOT-S) 8.6-50 MG tablet Take 2 tablets by mouth 2 (two) times daily.  . vitamin E 400 UNIT capsule Take 400 Units by mouth daily.   No facility-administered encounter medications on file as of 03/26/2016.     Functional Status:  In your present state of health, do you have any difficulty performing the following activities: 02/19/2016 02/19/2016  Hearing? N -  Vision? N -  Difficulty concentrating or making decisions? N -  Walking or climbing stairs? Y -  Dressing or bathing? Y -  Doing errands, shopping? Y N  Preparing Food and eating ? - -  Using the Toilet? - -  In the past six months, have you accidently leaked urine? - -  Do you have problems with loss of bowel control? - -  Managing your Medications? - -  Managing your Finances? - -  Some recent data might be hidden    Fall/Depression Screening:  PHQ 2/9 Scores 03/10/2016 01/20/2016 01/10/2016  PHQ - 2 Score 0 0 0  Some recent data might be hidden    Assessment: CSW completed SNF visit on 03/26/16 at [facility]. CSW spoke with SNF discharge planner Emmie Niemann. She reports that patient should be at facility for another 3-4 weeks still as physical therapy is still working with her. SNF discharge planner confirmed that patient will discharge to [facility] once she is discharged from SNF.  CSW went to patient's room. Patient reports that her appetite varies each day. She states that she ate a big breakfast this morning and therefore is not hungry enough to eat her lunch. She states that she is uncomfortable due to the swelling in her legs, feet and stomach. She reports that she is experiencing pain "all over still." She reports that she elevates her legs to help with the swelling. She shares that her friend Tye Maryland has been coming to visit her and brought her flowers. She reports that Tye Maryland will be coming tomorrow and they will  go to the ice cream social event at the SNF. She reports that she has not participated in a social activity yet at [facility] but is excited to do so tomorrow. She reports that she is experiencing some sadness but it is because she is not with her spouse. She states that she is ready to discharge to [facility] so that she can be with her spouse. She seems more coherent today than she did during last SNF visit. She reports "I'm being treated very well here, I just miss my husband." Patient was provided emotional support during session. CSW provided brief education on senior resources. She denies needing any community resource at this time since she will be discharging to ALF.  Plan: CSW will follow up within two weeks. CSW will continue to provide social work assistance and assist with transition to ALF if needed.  THN CM Care Plan Problem One   Flowsheet Row Most Recent Value  Care Plan Problem One  SNF admission  Role Documenting the Problem One  Clinical Social Worker  Care Plan for Problem One  Active  THN Long Term Goal (31-90 days)  Patient will have a safe and stable discharge from SNF within 90 days as evidenced by self report or by family report  [place] Term Goal Start Date  03/10/16  Interventions for Problem One Long Term Goal  CSW completed SNF visit and provided [name]. CSW will coordinate care with patient, family and SNF d/c planner. Family is seeking LTC placement at [facility] at this time for both patient and spouse. CSW will ensure that patient has a safe discharge there with needed services, resources and equipment.  THN CM Short Term Goal #1 (0-30 days)  Patient will attend all scheduled physical therapy within the next 30 days as evidenced by self report  THN CM Short Term Goal #1 Start Date  03/10/16  Interventions for Short Term Goal #1  Patient confirms that she is attending all scheduled appointments. CSW continues to provide emotional support and MI interevention.      [name], BSW, MSW, [email or web address] Phone: [phone] Fax: [phone]

## 2016-03-31 ENCOUNTER — Telehealth: Payer: Self-pay

## 2016-03-31 LAB — BASIC METABOLIC PANEL
BUN: 38 mg/dL — AB (ref 4–21)
Creatinine: 1.2 mg/dL — AB (ref 0.5–1.1)
Glucose: 79 mg/dL
Potassium: 3.7 mmol/L (ref 3.4–5.3)
SODIUM: 141 mmol/L (ref 137–147)

## 2016-03-31 NOTE — Telephone Encounter (Signed)
Faxed signed physician orders to [facility] on 03/20/16.

## 2016-04-02 ENCOUNTER — Encounter (HOSPITAL_COMMUNITY): Payer: Self-pay

## 2016-04-03 ENCOUNTER — Other Ambulatory Visit: Payer: Self-pay | Admitting: Licensed Clinical Social Worker

## 2016-04-03 NOTE — Patient Outreach (Addendum)
[place] [facility]) Care Management  04/03/2016  Victoria Fisher [birth year] [id number]   Assessment- CSW completed call to [facility] and left a HIPPA compliant voice message with SNF social worker requesting return call with updates in regards to patient's discharge.  Plan- CSW will await for return call or complete an additional outreach within a week.  [name], BSW, MSW, [email or web address] Phone: [phone] Fax: [phone]

## 2016-04-08 ENCOUNTER — Encounter (HOSPITAL_COMMUNITY): Payer: Self-pay

## 2016-04-10 ENCOUNTER — Encounter: Payer: Self-pay | Admitting: Adult Health

## 2016-04-10 ENCOUNTER — Non-Acute Institutional Stay (SKILLED_NURSING_FACILITY): Payer: Medicare Other | Admitting: Adult Health

## 2016-04-10 ENCOUNTER — Other Ambulatory Visit: Payer: Self-pay | Admitting: Licensed Clinical Social Worker

## 2016-04-10 DIAGNOSIS — I5033 Acute on chronic diastolic (congestive) heart failure: Secondary | ICD-10-CM | POA: Diagnosis not present

## 2016-04-10 DIAGNOSIS — D638 Anemia in other chronic diseases classified elsewhere: Secondary | ICD-10-CM

## 2016-04-10 DIAGNOSIS — E876 Hypokalemia: Secondary | ICD-10-CM

## 2016-04-10 DIAGNOSIS — K5901 Slow transit constipation: Secondary | ICD-10-CM | POA: Diagnosis not present

## 2016-04-10 DIAGNOSIS — N183 Chronic kidney disease, stage 3 unspecified: Secondary | ICD-10-CM

## 2016-04-10 DIAGNOSIS — J42 Unspecified chronic bronchitis: Secondary | ICD-10-CM | POA: Diagnosis not present

## 2016-04-10 DIAGNOSIS — I10 Essential (primary) hypertension: Secondary | ICD-10-CM | POA: Diagnosis not present

## 2016-04-10 DIAGNOSIS — I890 Lymphedema, not elsewhere classified: Secondary | ICD-10-CM | POA: Diagnosis not present

## 2016-04-10 DIAGNOSIS — E039 Hypothyroidism, unspecified: Secondary | ICD-10-CM

## 2016-04-10 DIAGNOSIS — J9611 Chronic respiratory failure with hypoxia: Secondary | ICD-10-CM

## 2016-04-10 DIAGNOSIS — I5032 Chronic diastolic (congestive) heart failure: Secondary | ICD-10-CM | POA: Diagnosis not present

## 2016-04-10 DIAGNOSIS — I482 Chronic atrial fibrillation, unspecified: Secondary | ICD-10-CM

## 2016-04-10 DIAGNOSIS — R531 Weakness: Secondary | ICD-10-CM

## 2016-04-10 DIAGNOSIS — G894 Chronic pain syndrome: Secondary | ICD-10-CM

## 2016-04-10 NOTE — Patient Outreach (Signed)
[facility]) Care Management  04/10/2016  Victoria Fisher [birth year] [id number]  Assessment- CSW completed outreach to [employer] [facility]. SNF discharge planner that provides services to patient is currently out of the office but CSW spoke to other SNF discharge to see if any updates could be provided. SNF discharge planner Victoria Fisher states that she cannot find any discharge updates but will leave message for SNF discharge planner Victoria Fisher to return call and provide updates.  Plan-CSW will await for return call or make additional outreach within one week.  Victoria Fisher, BSW, MSW, [email or web address] Phone: [phone] Fax: [phone]

## 2016-04-10 NOTE — Progress Notes (Signed)
DATE:  04/10/2016   MRN:  [id number]  BIRTHDAY: 1934/11/14  Facility:  Nursing Home Location:  [place] Room Number: 1003-A  LEVEL OF CARE:  SNF (31)  Contact Information    Name Relation Home Work Mobile   Victoria Fisher Spouse [phone]  [phone]   Shiela Mayer   [phone]   [facility] Daughter   [phone]   Ria Comment [phone]         Code Status History    Date Active Date Inactive Code Status Order ID Comments User Context   02/19/2016  5:17 AM 03/06/2016  5:20 PM Full Code [id number]  Rise Patience, MD ED   01/05/2016  9:57 PM 01/09/2016  4:42 PM Full Code [id number]  Ivor Costa, MD ED   11/14/2015  8:38 PM 11/17/2015  2:19 PM Full Code [id number]  Karmen Bongo, MD Inpatient   09/09/2015  9:31 PM 09/13/2015  9:44 PM Full Code [id number]  Ivor Costa, MD ED   07/07/2013  6:19 AM 07/11/2013  2:32 PM Full Code [id number]  Haywood Pao, MD Inpatient   06/08/2013  6:52 AM 06/11/2013  7:18 PM Full Code [id number]  Berle Mull, MD Inpatient       Chief Complaint  Patient presents with  . Medical Management of Chronic Issues    HISTORY OF PRESENT ILLNESS:  This is an 82-YO female seen for a routine visit. Latest tsh 8.953, elevated. She has edema of BUE R>L and edema of BLE 3+ . She was seen in her room and verbalized being able to breath better. She is currently on O2 @ 2L/min via [place]. Her PNA has resolved.  She was admitted to [facility] on 03/06/2016 following an admission at [facility] 02/18/2016-03/05/2016 for acute bronchitis. She continued to decline and was given a dose of Narcan with improvement in mental statu then changed to Levaquins. She had respiratory failure with hypoxia and was placed on BiPAP. She had wheezing and was placed on solu-medrol then changed to Prednisone. She was treated for pneumonia with Meropenem then changed to Levaquin.  She has PMH of chronic diastolic CHF, cor  pulmonale, hypertension, chronic lymphedema, CAD, chronic kidney disease, atrial fibrillation, chronic pain syndrome, COPD, frequent falls, hypothyroidism and OSA.  PAST MEDICAL HISTORY:  Past Medical History:  Diagnosis Date  . Abnormality of gait 01/30/2015  . Anemia in CKD (chronic kidney disease)   . Anxiety   . Arthritis   . Asthma    On nebulizer  . Atrial fibrillation, chronic ([place])    a. Rate controlled w/ BB. No OAC 2/2 falls and gait instability.  . Bowel obstruction    Caused by scar tissue  . Chronic cor pulmonale (HCC)    Most likely due to COPD (cannot rule out contribution of diastolic LV dysfunction). ECHO 07/30/11 pulmonary artery pressure was estimated to be 50-60% mmHg & there was moderate tricuspid insufficiency.  . Chronic diastolic CHF (congestive heart failure) ([place])    a. Predominantly right heart failure due to cor pulmonale & in turn this is most likely due to untreated OSA;  b. 05/2013 Echo: EF 55-60%, no rwma, mild to mod MR, sev dil LA, mod to sev dil RV w/ reduced systolic fxn, massively dil RA, mod-sev TR, PASP 30mmHg.  [name] Chronic pain syndrome   . Colon cancer ([place])    Carcinoma of sigmoid colon 1984 & 1985.  [name] COPD (chronic obstructive pulmonary disease) ([place])   .  Coronary artery disease    a. occluded proximal diagonal artery on angiography 2004  . Depression   . Diverticulosis of colon   . 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 Dyspnea on exertion    Reluctant to take diuretics  . Edema of both legs    Venous Doppler 05/11/12 was normal & no evidence of thrombus or thrombophlebitis. Chronic LE edema related to both cor pulmonale & lymphedema, hyperlipidemia & HTN involving coronary atherosclerosis by cardiac cath in 2004. Cath showed a tiny old occlusion of the optional diagonal branch & also proximal LAD.  [name] Frequent falls    Possibly due to orthostatic hypotension but we have not seen that on our visits. Recent fall 05/2012 with scalp hematoma, swollen  left LE, & black eyes.  . Hyperlipidemia    On a statin. Catheterization 02/12/03 Showed a tiny old occlusion of the optional diagonal branch & also proximal LAD.  [name] Hypertension   . Hypothyroidism 2002   On hormone replacement therapy  . IBS (irritable bowel syndrome)   . Insomnia   . Kidney failure 09/05/2015   Pt states that she went to the doctor on thursday and was told she is in stage 4 kidney failure  . Lung nodule   . Milroy's disease    Lymphatic disorder diagnosed at [facility]  . Obstructive sleep apnea    Polysomnogram 09/25/11 AHI: 53.6/hr overall & 57.1/hr during REM sleep. Poor compliance previously with CPAP  . Pulmonary hypertension    Pullmonary hypertension, chronic cor pulmonale w/pulmonary artery pressures of 50&#x2011; 60 mmHg.  [name] Refusal of blood transfusions as patient is Jehovah's Witness   . Tuberculosis      CURRENT MEDICATIONS: Reviewed  Patient's Medications  New Prescriptions   No medications on file  Previous Medications   ACETAMINOPHEN (TYLENOL) 325 MG TABLET    Take 650 mg by mouth 3 (three) times daily.   BISACODYL (DULCOLAX) 10 MG SUPPOSITORY    Place 10 mg rectally daily as needed for moderate constipation. Give 1 now and then qd prn   FUROSEMIDE (LASIX) 40 MG TABLET    Take 40-80 mg by mouth 2 (two) times daily. Take 80 mg QAM, 40 QPM   HYDROCORTISONE CREAM 1 %    Apply 1 application topically 2 (two) times daily. Apply to rashes behind ears x2 weeks   IPRATROPIUM-ALBUTEROL (DUONEB) 0.5-2.5 (3) MG/3ML SOLN    Take 3 mLs by nebulization every 2 (two) hours as needed.   IPRATROPIUM-ALBUTEROL (DUONEB) 0.5-2.5 (3) MG/3ML SOLN    Take 3 mLs by nebulization 3 (three) times daily.   LACTOBACILLUS ACIDOPHILUS (BACID) TABS TABLET    Take 1 tablet by mouth daily.   LACTULOSE (CHRONULAC) 10 GM/15ML SOLUTION    Take 30 g by mouth 2 (two) times daily as needed for mild constipation.    LEVOTHYROXINE (SYNTHROID, LEVOTHROID) 112 MCG TABLET    Take 1 tablet (112 mcg  total) by mouth every morning.   METOPROLOL SUCCINATE (TOPROL-XL) 50 MG 24 HR TABLET    Take 50 mg by mouth daily. Take with or immediately following a meal.   MORPHINE (MSIR) 15 MG TABLET    Take 1 tablet (15 mg total) by mouth 3 (three) times daily as needed (breakthrough pain).   MORPHINE SULFATE (MORPHINE CONCENTRATE) 10 MG/0.5ML SOLN CONCENTRATED SOLUTION    Take 0.5 mLs (10 mg total) by mouth 3 (three) times daily.   NUTRITIONAL SUPPLEMENT LIQD    Take 120 mLs by mouth 2 (two)  times daily. [employer]    ONDANSETRON (ZOFRAN) 4 MG TABLET    Take 4 mg by mouth every 8 (eight) hours as needed for nausea or vomiting.   POLYETHYLENE GLYCOL (MIRALAX / GLYCOLAX) PACKET    Take 17 g by mouth daily. Mix in 8 oz liquid and drink   POTASSIUM CHLORIDE SA (K-DUR,KLOR-CON) 20 MEQ TABLET    Take 20 mEq by mouth daily.   SENNOSIDES-DOCUSATE SODIUM (SENOKOT-S) 8.6-50 MG TABLET    Take 2 tablets by mouth 2 (two) times daily.   VITAMIN Fisher 400 UNIT CAPSULE    Take 400 Units by mouth daily.  Modified Medications   No medications on file  Discontinued Medications   DOXYCYCLINE (VIBRA-TABS) 100 MG TABLET    Take 100 mg by mouth 2 (two) times daily. x10 days   FUROSEMIDE (LASIX) 40 MG TABLET    Take 40 mg by mouth daily.   MOMETASONE (NASONEX) 50 MCG/ACT NASAL SPRAY    Place 2 sprays into the nose daily as needed (congestion).   POTASSIUM CHLORIDE (K-DUR) 10 MEQ TABLET    Take 10 mEq by mouth daily.   SACCHAROMYCES BOULARDII (FLORASTOR) 250 MG CAPSULE    Take 250 mg by mouth daily. x13 days     Allergies  Allergen Reactions  . Codeine Anaphylaxis, Hives and Nausea And Vomiting  . Gabapentin Swelling and Other (See Comments)    Hallucinations, severe facial swelling   . Other Other (See Comments)    Will not accept blood or blood products - patient is Jehovah's Witness  . Ciprofloxacin Hives and Nausea And Vomiting  . Latex Hives and Rash  . Penicillins Hives    Has patient had a PCN reaction causing  immediate rash, facial/tongue/throat swelling, SOB or lightheadedness with hypotension: Yes Has patient had a PCN reaction causing severe rash involving mucus membranes or skin necrosis: Yes Has patient had a PCN reaction that required hospitalization Yes Has patient had a PCN reaction occurring within the last 10 years: Yes If all of the above answers are "NO", then may proceed with Cephalosporin use.  . Sulfonamide Derivatives Hives and Rash     REVIEW OF SYSTEMS:  GENERAL: no change in appetite, no fatigue, no weight changes, no fever, chills or weakness EYES: Denies change in vision, dry eyes, eye pain, itching or discharge EARS: Denies change in hearing, ringing in ears, or earache NOSE: Denies nasal congestion or epistaxis MOUTH and THROAT: Denies oral discomfort, gingival pain or bleeding, pain from teeth or hoarseness   RESPIRATORY: no cough, SOB, DOE, wheezing, hemoptysis CARDIAC: no chest pain or palpitations GI: no abdominal pain, diarrhea, constipation, heart burn, nausea or vomiting GU: Denies dysuria, frequency, hematuria, incontinence, or discharge PSYCHIATRIC: Denies feeling of depression or anxiety. No report of hallucinations, insomnia, paranoia, or agitation     PHYSICAL EXAMINATION  GENERAL APPEARANCE: Well nourished. In no acute distress. Obese SKIN:  Skin is warm and dry. Bilateral legs covered with ABD pads and ACE wrap HEAD: Normal in size and contour.  EYES: Lids open and close normally. No blepharitis, entropion or ectropion. PERRL. Conjunctivae are clear and sclerae are white. Lenses are without opacity EARS: Pinnae are normal. Patient hears normal voice tunes of the examiner MOUTH and THROAT: Lips are without lesions. Oral mucosa is moist and without lesions. Tongue is normal in shape, size, and color and without lesions NECK: supple, trachea midline, no neck masses, no thyroid tenderness, no thyromegaly LYMPHATICS: no LAN in the neck, no supraclavicular  LAN RESPIRATORY: breathing is even & unlabored, BS CTAB CARDIAC: RRR, no murmur,no extra heart sounds, BUE 2+ R>L; BLE 3+edema  GI: abdomen soft, normal BS, no masses, no tenderness, no hepatomegaly, no splenomegaly EXTREMITIES:  Able to move X 4 extremities  PSYCHIATRIC: Alert and oriented X 3. Affect and behavior are appropriate   LABS/RADIOLOGY: Labs reviewed: 04/10/16   TSH 8.953 04/07/16   sodium 140  K4.7 glucose 69 BUN 37 creatinine 1.07 calcium 9.4 GFR 52.43 CO2 [id number] Basic Metabolic Panel:  Recent Labs  09/12/15 0912  11/16/15 0619 11/17/15 0605  12/18/15 1131  03/02/16 0214 03/04/16 0258 03/05/16 0223  03/18/16 03/26/16 03/31/16  NA 141  < > 140 142  142  < > 137  < > 147* 146* 146*  < > 139 140 141  K 3.6  < > 4.6 3.9  4.0  < > 3.2*  < > 3.9 3.9 4.5  < > 4.1 4.5 3.7  CL 105  < > 101 100*  100*  < > 92*  < > 97* 106 104  --   --   --   --   CO2 28  < > 34* 34*  34*  < > 34*  < > 34* 34* 35*  --   --   --   --   GLUCOSE 115*  < > 97 84  86  < > 93  < > 110* 160* 122*  --   --   --   --   BUN 40*  < > 32* 31*  30*  < > 84*  < > 84* 97* 84*  < > 29* 43* 38*  CREATININE 1.38*  < > 1.49* 1.36*  1.41*  < > 1.88*  < > 1.37* 1.17* 0.95  < > 1.0 1.1 1.2*  CALCIUM 9.4  < > 9.6 10.0  10.0  < > 9.7  < > 10.4* 9.7 9.9  --   --   --   --   MG 2.4  --   --   --   --  2.5  --   --   --   --   --   --   --   --   PHOS  --   --  2.7 2.4*  --   --   --   --   --   --   --   --   --   --   < > = values in this interval not displayed. Liver Function Tests:  Recent Labs  01/31/16 0922 02/18/16 2220 02/19/16 0533  AST 27 50* 54*  ALT 11* 16 15  ALKPHOS 63 70 63  BILITOT 0.8 1.4* 1.5*  PROT 6.9 7.2 6.5  ALBUMIN 3.4* 3.3* 3.1*    Recent Labs  01/31/16 0922  LIPASE 19   CBC:  Recent Labs  02/18/16 2220 02/19/16 0533  02/21/16 [id number]  02/28/16 0554 03/05/16 0223 03/06/16 0322 03/11/16  WBC 3.8* 3.7*  < > 4.7  < > 10.4 7.9 7.4 14.9  NEUTROABS 2.6 2.3  --  3.3  --    --   --   --   --   HGB 12.8 12.0  < > 12.1  < > 14.0 10.6* 10.3* 10.1*  HCT 39.0 37.4  < > 39.0  < > 43.7 35.6* 34.2* 32*  MCV 101.0* 100.3*  < > 104.0*  < > 101.2* 107.2* 107.5*  --   PLT 73* 90*  < >  83*  < > 120* 99* 91* 151  < > = values in this interval not displayed. Lipid Panel:  Recent Labs  09/10/15 0403  HDL 32*   Cardiac Enzymes:  Recent Labs  09/09/15 1821  02/19/16 1226 02/19/16 1840 02/26/16 0951  CKTOTAL 244*  --   --   --   --   TROPONINI  --   < > 0.04* 0.04* <0.03  < > = values in this interval not displayed. CBG:  Recent Labs  09/13/15 0754 09/13/15 1302 01/31/16 0924  GLUCAP 79 87 81      ASSESSMENT/PLAN:  Generalized weakness - continue PT and OT for therapeutic strengthening exercises; fall precautions  Acute on chronic diastolic CHF - recently increased Lasix to 80 mg PO Q AM and 40 mg Q PM, continue Toprol-XL 50 mg 1 tab by mouth daily  Chronic respiratory failure - continue O2 @ 2L/min via [place] and Duoneb PRN  COPD - no wheezing; continue O2 @ 2L/min via [place] and Duoneb PRN  Anemia of chronic disease - stable Lab Results  Component Value Date   HGB 10.1 (A) 03/11/2016   Hypothyroidism - tsh 8.953; increase Synthroid from 112 mcg to 125 mcg daily; tsh in 1 month  Constipation - continue Senna-S 2 tabs PO BID, fracture10 g/15. 13 mL by mouth twice a day when necessary, Dulcolax 10 mg suppository rectally daily when necessary And MiraLAX 17 g by mouth twice a day  Hypokalemia - K 4.7 ; continue KCL ER 20 meq 1 tab PO Q D  Atrial fibrillation - rate controlled; continue Toprol-XL 50 mg tab by mouth daily  Chronic pain - well-controlled; continue morphine 10 mg/0.5 mL give 0.5 mL by mouth 3 times a day, Tylenol 325 mg 2 tabs = 650 mg by mouth 3 times a day and morphine 15 mg 1 tab by mouth 3 times a day when necessary  CKD, stage 3 - creatinine 1.07; will monitor   Goals of care:  Short-term rehabilitation; palliative care    Oluwadamilola Deliz  C. [place] - NP    [employer] [phone]

## 2016-04-13 ENCOUNTER — Other Ambulatory Visit: Payer: Self-pay | Admitting: Licensed Clinical Social Worker

## 2016-04-13 NOTE — Patient Outreach (Signed)
Victoria Fisher [facility]) Care Management  04/13/2016  Victoria Fisher [birth year] [id number]  Assessment- CSW received return call from SNF discharge planner Emmie Niemann on 04/13/16. She confirms that patient is still at facility and still receiving therapy. She states that there has been no discussion on expected discharge yet but that [facility] will be completing a visit soon with patient.  Plan-CSW will continue to follow case and assist with discharge planning. CSW will follow up within two weeks.  Victoria Fisher, BSW, MSW, [email or web address] Phone: [phone] Fax: [phone]

## 2016-04-14 ENCOUNTER — Other Ambulatory Visit (HOSPITAL_COMMUNITY): Payer: Self-pay

## 2016-04-14 ENCOUNTER — Inpatient Hospital Stay (HOSPITAL_COMMUNITY)
Admission: EM | Admit: 2016-04-14 | Discharge: 2016-05-17 | DRG: 291 | Disposition: E | Payer: Medicare Other | Attending: Internal Medicine | Admitting: Internal Medicine

## 2016-04-14 ENCOUNTER — Emergency Department (HOSPITAL_COMMUNITY): Payer: Medicare Other

## 2016-04-14 ENCOUNTER — Encounter (HOSPITAL_COMMUNITY): Payer: Self-pay

## 2016-04-14 DIAGNOSIS — I2510 Atherosclerotic heart disease of native coronary artery without angina pectoris: Secondary | ICD-10-CM | POA: Diagnosis present

## 2016-04-14 DIAGNOSIS — J90 Pleural effusion, not elsewhere classified: Secondary | ICD-10-CM

## 2016-04-14 DIAGNOSIS — Z885 Allergy status to narcotic agent status: Secondary | ICD-10-CM

## 2016-04-14 DIAGNOSIS — I509 Heart failure, unspecified: Secondary | ICD-10-CM

## 2016-04-14 DIAGNOSIS — I5023 Acute on chronic systolic (congestive) heart failure: Secondary | ICD-10-CM

## 2016-04-14 DIAGNOSIS — R296 Repeated falls: Secondary | ICD-10-CM | POA: Diagnosis present

## 2016-04-14 DIAGNOSIS — Z91040 Latex allergy status: Secondary | ICD-10-CM

## 2016-04-14 DIAGNOSIS — J9621 Acute and chronic respiratory failure with hypoxia: Secondary | ICD-10-CM | POA: Diagnosis present

## 2016-04-14 DIAGNOSIS — I130 Hypertensive heart and chronic kidney disease with heart failure and stage 1 through stage 4 chronic kidney disease, or unspecified chronic kidney disease: Principal | ICD-10-CM | POA: Diagnosis present

## 2016-04-14 DIAGNOSIS — I481 Persistent atrial fibrillation: Secondary | ICD-10-CM

## 2016-04-14 DIAGNOSIS — Z9981 Dependence on supplemental oxygen: Secondary | ICD-10-CM | POA: Diagnosis not present

## 2016-04-14 DIAGNOSIS — Z66 Do not resuscitate: Secondary | ICD-10-CM | POA: Diagnosis present

## 2016-04-14 DIAGNOSIS — I272 Pulmonary hypertension, unspecified: Secondary | ICD-10-CM | POA: Diagnosis present

## 2016-04-14 DIAGNOSIS — I5043 Acute on chronic combined systolic (congestive) and diastolic (congestive) heart failure: Secondary | ICD-10-CM | POA: Diagnosis present

## 2016-04-14 DIAGNOSIS — G4733 Obstructive sleep apnea (adult) (pediatric): Secondary | ICD-10-CM | POA: Diagnosis present

## 2016-04-14 DIAGNOSIS — G894 Chronic pain syndrome: Secondary | ICD-10-CM | POA: Diagnosis present

## 2016-04-14 DIAGNOSIS — J9601 Acute respiratory failure with hypoxia: Secondary | ICD-10-CM | POA: Diagnosis not present

## 2016-04-14 DIAGNOSIS — Z90710 Acquired absence of both cervix and uterus: Secondary | ICD-10-CM

## 2016-04-14 DIAGNOSIS — Z880 Allergy status to penicillin: Secondary | ICD-10-CM

## 2016-04-14 DIAGNOSIS — J9611 Chronic respiratory failure with hypoxia: Secondary | ICD-10-CM | POA: Diagnosis present

## 2016-04-14 DIAGNOSIS — N183 Chronic kidney disease, stage 3 (moderate): Secondary | ICD-10-CM | POA: Diagnosis present

## 2016-04-14 DIAGNOSIS — I5033 Acute on chronic diastolic (congestive) heart failure: Secondary | ICD-10-CM | POA: Diagnosis present

## 2016-04-14 DIAGNOSIS — E039 Hypothyroidism, unspecified: Secondary | ICD-10-CM | POA: Diagnosis present

## 2016-04-14 DIAGNOSIS — R0602 Shortness of breath: Secondary | ICD-10-CM | POA: Diagnosis not present

## 2016-04-14 DIAGNOSIS — Z9049 Acquired absence of other specified parts of digestive tract: Secondary | ICD-10-CM

## 2016-04-14 DIAGNOSIS — Z8249 Family history of ischemic heart disease and other diseases of the circulatory system: Secondary | ICD-10-CM

## 2016-04-14 DIAGNOSIS — I2781 Cor pulmonale (chronic): Secondary | ICD-10-CM | POA: Diagnosis not present

## 2016-04-14 DIAGNOSIS — J449 Chronic obstructive pulmonary disease, unspecified: Secondary | ICD-10-CM | POA: Diagnosis present

## 2016-04-14 DIAGNOSIS — I5082 Biventricular heart failure: Secondary | ICD-10-CM | POA: Diagnosis present

## 2016-04-14 DIAGNOSIS — Z515 Encounter for palliative care: Secondary | ICD-10-CM | POA: Diagnosis not present

## 2016-04-14 DIAGNOSIS — R601 Generalized edema: Secondary | ICD-10-CM | POA: Diagnosis not present

## 2016-04-14 DIAGNOSIS — I5021 Acute systolic (congestive) heart failure: Secondary | ICD-10-CM | POA: Diagnosis not present

## 2016-04-14 DIAGNOSIS — R2240 Localized swelling, mass and lump, unspecified lower limb: Secondary | ICD-10-CM | POA: Diagnosis not present

## 2016-04-14 DIAGNOSIS — I480 Paroxysmal atrial fibrillation: Secondary | ICD-10-CM | POA: Diagnosis present

## 2016-04-14 DIAGNOSIS — I110 Hypertensive heart disease with heart failure: Secondary | ICD-10-CM | POA: Diagnosis not present

## 2016-04-14 DIAGNOSIS — R600 Localized edema: Secondary | ICD-10-CM | POA: Diagnosis not present

## 2016-04-14 DIAGNOSIS — I10 Essential (primary) hypertension: Secondary | ICD-10-CM | POA: Diagnosis present

## 2016-04-14 DIAGNOSIS — E669 Obesity, unspecified: Secondary | ICD-10-CM | POA: Diagnosis present

## 2016-04-14 DIAGNOSIS — D631 Anemia in chronic kidney disease: Secondary | ICD-10-CM | POA: Diagnosis present

## 2016-04-14 DIAGNOSIS — I482 Chronic atrial fibrillation: Secondary | ICD-10-CM | POA: Diagnosis present

## 2016-04-14 DIAGNOSIS — M1990 Unspecified osteoarthritis, unspecified site: Secondary | ICD-10-CM | POA: Diagnosis present

## 2016-04-14 DIAGNOSIS — Z452 Encounter for adjustment and management of vascular access device: Secondary | ICD-10-CM | POA: Diagnosis not present

## 2016-04-14 DIAGNOSIS — G4700 Insomnia, unspecified: Secondary | ICD-10-CM | POA: Diagnosis present

## 2016-04-14 DIAGNOSIS — R32 Unspecified urinary incontinence: Secondary | ICD-10-CM | POA: Diagnosis not present

## 2016-04-14 DIAGNOSIS — I081 Rheumatic disorders of both mitral and tricuspid valves: Secondary | ICD-10-CM | POA: Diagnosis present

## 2016-04-14 DIAGNOSIS — Z888 Allergy status to other drugs, medicaments and biological substances status: Secondary | ICD-10-CM

## 2016-04-14 DIAGNOSIS — Z6831 Body mass index (BMI) 31.0-31.9, adult: Secondary | ICD-10-CM

## 2016-04-14 DIAGNOSIS — I890 Lymphedema, not elsewhere classified: Secondary | ICD-10-CM

## 2016-04-14 DIAGNOSIS — R931 Abnormal findings on diagnostic imaging of heart and coronary circulation: Secondary | ICD-10-CM | POA: Diagnosis not present

## 2016-04-14 DIAGNOSIS — E43 Unspecified severe protein-calorie malnutrition: Secondary | ICD-10-CM | POA: Diagnosis present

## 2016-04-14 DIAGNOSIS — E8770 Fluid overload, unspecified: Secondary | ICD-10-CM | POA: Diagnosis not present

## 2016-04-14 DIAGNOSIS — I2729 Other secondary pulmonary hypertension: Secondary | ICD-10-CM | POA: Diagnosis present

## 2016-04-14 DIAGNOSIS — Z820 Family history of epilepsy and other diseases of the nervous system: Secondary | ICD-10-CM

## 2016-04-14 DIAGNOSIS — Z833 Family history of diabetes mellitus: Secondary | ICD-10-CM

## 2016-04-14 DIAGNOSIS — J9811 Atelectasis: Secondary | ICD-10-CM | POA: Diagnosis not present

## 2016-04-14 DIAGNOSIS — N189 Chronic kidney disease, unspecified: Secondary | ICD-10-CM

## 2016-04-14 DIAGNOSIS — E785 Hyperlipidemia, unspecified: Secondary | ICD-10-CM | POA: Diagnosis present

## 2016-04-14 DIAGNOSIS — Z882 Allergy status to sulfonamides status: Secondary | ICD-10-CM

## 2016-04-14 DIAGNOSIS — Z85038 Personal history of other malignant neoplasm of large intestine: Secondary | ICD-10-CM

## 2016-04-14 LAB — ECHOCARDIOGRAM LIMITED
CHL CUP RV SYS PRESS: 42 mmHg
CHL CUP TV REG PEAK VELOCITY: 258 cm/s
FS: 22 % — AB (ref 28–44)
Height: 65 in
IV/PV OW: 0.8
LA ID, A-P, ES: 49 mm
LA diam index: 2.53 cm/m2
LEFT ATRIUM END SYS DIAM: 49 mm
LV PW d: 9.35 mm — AB (ref 0.6–1.1)
TRMAXVEL: 258 cm/s
WEIGHTICAEL: 3040 [oz_av]

## 2016-04-14 LAB — BASIC METABOLIC PANEL
ANION GAP: 11 (ref 5–15)
BUN: 35 mg/dL — ABNORMAL HIGH (ref 6–20)
CO2: 28 mmol/L (ref 22–32)
Calcium: 9.9 mg/dL (ref 8.9–10.3)
Chloride: 100 mmol/L — ABNORMAL LOW (ref 101–111)
Creatinine, Ser: 1.13 mg/dL — ABNORMAL HIGH (ref 0.44–1.00)
GFR calc Af Amer: 52 mL/min — ABNORMAL LOW (ref >60)
GFR, EST NON AFRICAN AMERICAN: 45 mL/min — AB (ref >60)
Glucose, Bld: 92 mg/dL (ref 65–99)
Potassium: 4 mmol/L (ref 3.5–5.1)
SODIUM: 139 mmol/L (ref 135–145)

## 2016-04-14 LAB — ALBUMIN: ALBUMIN: 3 g/dL — AB (ref 3.5–5.0)

## 2016-04-14 LAB — CBC
HCT: 30.4 % — ABNORMAL LOW (ref 36.0–46.0)
HEMOGLOBIN: 9.3 g/dL — AB (ref 12.0–15.0)
MCH: 32.2 pg (ref 26.0–34.0)
MCHC: 30.6 g/dL (ref 30.0–36.0)
MCV: 105.2 fL — ABNORMAL HIGH (ref 78.0–100.0)
Platelets: 114 10*3/uL — ABNORMAL LOW (ref 150–400)
RBC: 2.89 MIL/uL — ABNORMAL LOW (ref 3.87–5.11)
RDW: 15.8 % — AB (ref 11.5–15.5)
WBC: 4.7 10*3/uL (ref 4.0–10.5)

## 2016-04-14 LAB — TROPONIN I: Troponin I: <0.03 ng/mL (ref <0.03)

## 2016-04-14 LAB — I-STAT TROPONIN, ED: TROPONIN I, POC: 0.02 ng/mL (ref 0.00–0.08)

## 2016-04-14 LAB — BRAIN NATRIURETIC PEPTIDE: B Natriuretic Peptide: 384.4 pg/mL — ABNORMAL HIGH (ref 0.0–100.0)

## 2016-04-14 MED ORDER — ONDANSETRON HCL 4 MG/2ML IJ SOLN
4.0000 mg | Freq: Four times a day (QID) | INTRAMUSCULAR | Status: DC | PRN
  Administered 2016-04-15 – 2016-04-20 (×2): 4 mg via INTRAVENOUS
  Filled 2016-04-14 (×2): qty 2

## 2016-04-14 MED ORDER — METOPROLOL SUCCINATE ER 50 MG PO TB24
50.0000 mg | ORAL_TABLET | Freq: Every day | ORAL | Status: DC
  Administered 2016-04-15 – 2016-04-21 (×7): 50 mg via ORAL
  Filled 2016-04-14 (×7): qty 1

## 2016-04-14 MED ORDER — POLYETHYLENE GLYCOL 3350 17 G PO PACK
17.0000 g | PACK | Freq: Every day | ORAL | Status: DC
  Administered 2016-04-15 – 2016-04-21 (×6): 17 g via ORAL
  Filled 2016-04-14 (×7): qty 1

## 2016-04-14 MED ORDER — ACETAMINOPHEN 325 MG PO TABS: 650.0000 mg | ORAL_TABLET | Freq: Four times a day (QID) | ORAL | Status: DC | PRN

## 2016-04-14 MED ORDER — ONDANSETRON HCL 4 MG PO TABS
4.0000 mg | ORAL_TABLET | Freq: Four times a day (QID) | ORAL | Status: DC | PRN
  Administered 2016-04-16: 4 mg via ORAL
  Filled 2016-04-14: qty 1

## 2016-04-14 MED ORDER — IPRATROPIUM-ALBUTEROL 0.5-2.5 (3) MG/3ML IN SOLN
3.0000 mL | RESPIRATORY_TRACT | Status: DC | PRN
  Administered 2016-04-20: 3 mL via RESPIRATORY_TRACT
  Filled 2016-04-14: qty 3

## 2016-04-14 MED ORDER — FUROSEMIDE 10 MG/ML IJ SOLN
80.0000 mg | Freq: Two times a day (BID) | INTRAMUSCULAR | Status: DC
  Administered 2016-04-15 – 2016-04-16 (×4): 80 mg via INTRAVENOUS
  Filled 2016-04-14 (×5): qty 8

## 2016-04-14 MED ORDER — ACETAMINOPHEN 650 MG RE SUPP: 650.0000 mg | Freq: Four times a day (QID) | RECTAL | Status: DC | PRN

## 2016-04-14 MED ORDER — FUROSEMIDE 10 MG/ML IJ SOLN
80.0000 mg | Freq: Once | INTRAMUSCULAR | Status: AC
  Administered 2016-04-14: 80 mg via INTRAVENOUS
  Filled 2016-04-14: qty 8

## 2016-04-14 MED ORDER — SENNOSIDES-DOCUSATE SODIUM 8.6-50 MG PO TABS
2.0000 | ORAL_TABLET | Freq: Two times a day (BID) | ORAL | Status: DC
  Administered 2016-04-15 – 2016-04-21 (×10): 2 via ORAL
  Filled 2016-04-14 (×15): qty 2

## 2016-04-14 MED ORDER — MORPHINE SULFATE 15 MG PO TABS
15.0000 mg | ORAL_TABLET | Freq: Three times a day (TID) | ORAL | Status: DC | PRN
  Administered 2016-04-15 – 2016-04-21 (×11): 15 mg via ORAL
  Filled 2016-04-14 (×11): qty 1

## 2016-04-14 MED ORDER — LACTULOSE 10 GM/15ML PO SOLN: 30.0000 g | Freq: Two times a day (BID) | ORAL | Status: DC | PRN

## 2016-04-14 MED ORDER — ENOXAPARIN SODIUM 40 MG/0.4ML ~~LOC~~ SOLN
40.0000 mg | SUBCUTANEOUS | Status: DC
  Administered 2016-04-15 – 2016-04-16 (×3): 40 mg via SUBCUTANEOUS
  Filled 2016-04-14 (×9): qty 0.4

## 2016-04-14 MED ORDER — SODIUM CHLORIDE 0.9% FLUSH
3.0000 mL | Freq: Two times a day (BID) | INTRAVENOUS | Status: DC
  Administered 2016-04-15 – 2016-04-21 (×15): 3 mL via INTRAVENOUS

## 2016-04-14 MED ORDER — POTASSIUM CHLORIDE CRYS ER 20 MEQ PO TBCR
40.0000 meq | EXTENDED_RELEASE_TABLET | Freq: Two times a day (BID) | ORAL | Status: DC
  Administered 2016-04-15 – 2016-04-18 (×9): 40 meq via ORAL
  Filled 2016-04-14 (×9): qty 2

## 2016-04-14 MED ORDER — BISACODYL 10 MG RE SUPP
10.0000 mg | Freq: Every day | RECTAL | Status: DC | PRN
Start: 2016-04-14 — End: 2016-04-22

## 2016-04-14 MED ORDER — LEVOTHYROXINE SODIUM 125 MCG PO TABS
125.0000 ug | ORAL_TABLET | Freq: Every day | ORAL | Status: DC
  Administered 2016-04-15 – 2016-04-21 (×6): 125 ug via ORAL
  Filled 2016-04-14 (×10): qty 1

## 2016-04-14 MED ORDER — ACETAMINOPHEN 325 MG PO TABS
650.0000 mg | ORAL_TABLET | Freq: Four times a day (QID) | ORAL | Status: DC | PRN
  Administered 2016-04-16 – 2016-04-18 (×4): 650 mg via ORAL
  Filled 2016-04-14 (×4): qty 2

## 2016-04-14 NOTE — ED Notes (Signed)
Pt returned from XR. Phlebotomy at bedside.

## 2016-04-14 NOTE — ED Notes (Signed)
Admitting provider at bedside.

## 2016-04-14 NOTE — ED Notes (Signed)
Per admitting provider, pt able to have water. Pt given cup of ice water.

## 2016-04-14 NOTE — ED Notes (Signed)
Pt placed on bedpan by this nurse and Gerald Stabs, NT. Bed pad and brief changed, pt with dry and swollen skin to perineal area.

## 2016-04-14 NOTE — ED Notes (Signed)
ED Provider at bedside. 

## 2016-04-14 NOTE — ED Triage Notes (Signed)
Pt presents via EMS from [facility] with CHF exacerbation x 1 week. Per EMS, pt c/o bilateral upper and lower extremity edema, abdominal distention and ascites, generalized pain, and SOB. Per EMS, pt on maximum dose of lasix facility will administer. Per EMS, lung sounds clear, respirations shallow, pt on 2-3 L home O2. 124/80, HR 78, RR 18, 100% on 2-3 L O2 [place], CBG 173. A&Ox4.

## 2016-04-14 NOTE — ED Notes (Signed)
Cardiology at bedside.

## 2016-04-14 NOTE — ED Notes (Signed)
IV team at bedside 

## 2016-04-14 NOTE — H&P (Addendum)
History and Physical    Victoria Fisher [id number] DOB: 10-08-1934 DOA: 04/04/2016  Referring MD/NP/PA: EDP PCP: Haywood Pao, MD  Outpatient Specialists: Dr.Croitoru Patient coming from: SNF  Chief Complaint: Dyspnea/swelling  HPI: Victoria Fisher is a 82 y.o. female with medical history significant for chronic diastolic CHF, cor pulmonale, Pulm HTN, COPD, chronic resp failure on 3-4L O2, HTN, chronic lymphedema, CAD, chronic kidney disease 3, P A. fib, chronic pain syndrome, COPD, frequent falls, hypothyroid and OSA presented to ED from SNF due to worsening edema, extending from her legs to her abdomen, including breasts, despite being on lasix 80 and 40mg  daily. She reports progressive worsening dyspnea and now edema significantly limiting her mobility. She reports compliance with meds/diet-now lives at [facility]. She thinks she may be 40lbs above her usual weight. ED Course: CXR with pleural effusions, chronic anemia, Cards consulted and TRH consulted for admission  Review of Systems: As per HPI otherwise 10 point review of systems negative.   Past Medical History:  Diagnosis Date  . Abnormality of gait 01/30/2015  . Anemia in CKD (chronic kidney disease)   . Anxiety   . Arthritis   . Asthma    On nebulizer  . Atrial fibrillation, chronic ([place])    a. Rate controlled w/ BB. No OAC 2/2 falls and gait instability.  . Bowel obstruction    Caused by scar tissue  . Chronic cor pulmonale (HCC)    Most likely due to COPD (cannot rule out contribution of diastolic LV dysfunction). ECHO 07/30/11 pulmonary artery pressure was estimated to be 50-60% mmHg & there was moderate tricuspid insufficiency.  . Chronic diastolic CHF (congestive heart failure) ([place])    a. Predominantly right heart failure due to cor pulmonale & in turn this is most likely due to untreated OSA;  b. 05/2013 Echo: EF 55-60%, no rwma, mild to mod MR, sev dil LA, mod to sev dil RV w/ reduced systolic fxn, massively dil  RA, mod-sev TR, PASP 5mmHg.  [name] Chronic pain syndrome   . Colon cancer ([place])    Carcinoma of sigmoid colon 1984 & 1985.  [name] COPD (chronic obstructive pulmonary disease) ([place])   . Coronary artery disease    a. occluded proximal diagonal artery on angiography 2004  . Depression   . Diverticulosis of colon   . 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 Dyspnea on exertion    Reluctant to take diuretics  . Edema of both legs    Venous Doppler 05/11/12 was normal & no evidence of thrombus or thrombophlebitis. Chronic LE edema related to both cor pulmonale & lymphedema, hyperlipidemia & HTN involving coronary atherosclerosis by cardiac cath in 2004. Cath showed a tiny old occlusion of the optional diagonal branch & also proximal LAD.  [name] Frequent falls    Possibly due to orthostatic hypotension but we have not seen that on our visits. Recent fall 05/2012 with scalp hematoma, swollen left LE, & black eyes.  . Hyperlipidemia    On a statin. Catheterization 02/12/03 Showed a tiny old occlusion of the optional diagonal branch & also proximal LAD.  [name] Hypertension   . Hypothyroidism 2002   On hormone replacement therapy  . IBS (irritable bowel syndrome)   . Insomnia   . Kidney failure 09/05/2015   Pt states that she went to the doctor on thursday and was told she is in stage 4 kidney failure  . Lung nodule   . Milroy's disease    Lymphatic disorder diagnosed at  [facility]  . Obstructive sleep apnea    Polysomnogram 09/25/11 AHI: 53.6/hr overall & 57.1/hr during REM sleep. Poor compliance previously with CPAP  . Pulmonary hypertension    Pullmonary hypertension, chronic cor pulmonale w/pulmonary artery pressures of 50&#x2011; 60 mmHg.  [name] Refusal of blood transfusions as patient is Jehovah's Witness   . Tuberculosis     Past Surgical History:  Procedure Laterality Date  . ABDOMINAL HYSTERECTOMY  1983  . APPENDECTOMY  1956  . CARDIAC CATHETERIZATION  02/12/03   Showed a tiny old occlusion of the optional  diagonal branch & also proximal LAD.  [name] CHOLECYSTECTOMY  1984  . COLON BIOPSY    . COLON RESECTION     2 times  . COLONOSCOPY  07/23/10     reports that she has never smoked. She has never used smokeless tobacco. She reports that she does not drink alcohol or use drugs.  Allergies  Allergen Reactions  . Codeine Anaphylaxis, Hives and Nausea And Vomiting  . Gabapentin Swelling and Other (See Comments)    Hallucinations, severe facial swelling   . Other Other (See Comments)    Will not accept blood or blood products - patient is Jehovah's Witness  . Ciprofloxacin Hives and Nausea And Vomiting  . Latex Hives and Rash  . Penicillins Hives    Has patient had a PCN reaction causing immediate rash, facial/tongue/throat swelling, SOB or lightheadedness with hypotension: Yes Has patient had a PCN reaction causing severe rash involving mucus membranes or skin necrosis: Yes Has patient had a PCN reaction that required hospitalization Yes Has patient had a PCN reaction occurring within the last 10 years: Yes If all of the above answers are "NO", then may proceed with Cephalosporin use.  . Sulfonamide Derivatives Hives and Rash    Family History  Problem Relation Age of Onset  . Heart disease Father   . Alzheimer's disease Mother   . Parkinsonism Brother   . Diabetes Brother     Prior to Admission medications   Medication Sig Start Date End Date Taking? Authorizing Provider  acetaminophen (TYLENOL) 325 MG tablet Take 650 mg by mouth 3 (three) times daily.    Historical Provider, MD  bisacodyl (DULCOLAX) 10 MG suppository Place 10 mg rectally daily as needed for moderate constipation. Give 1 now and then qd prn    Historical Provider, MD  furosemide (LASIX) 40 MG tablet Take 40-80 mg by mouth 2 (two) times daily. Take 80 mg QAM, 40 QPM    Historical Provider, MD  hydrocortisone cream 1 % Apply 1 application topically 2 (two) times daily. Apply to rashes behind ears x2 weeks 03/30/16 04/09/2016   Historical Provider, MD  ipratropium-albuterol (DUONEB) 0.5-2.5 (3) MG/3ML SOLN Take 3 mLs by nebulization every 2 (two) hours as needed. 03/05/16   Robbie Lis, MD  ipratropium-albuterol (DUONEB) 0.5-2.5 (3) MG/3ML SOLN Take 3 mLs by nebulization 3 (three) times daily. 03/05/16   Robbie Lis, MD  lactobacillus acidophilus (BACID) TABS tablet Take 1 tablet by mouth daily.    Historical Provider, MD  lactulose (CHRONULAC) 10 GM/15ML solution Take 30 g by mouth 2 (two) times daily as needed for mild constipation.     Historical Provider, MD  levothyroxine (SYNTHROID, LEVOTHROID) 112 MCG tablet Take 1 tablet (112 mcg total) by mouth every morning. 06/30/13   Mihai Croitoru, MD  metoprolol succinate (TOPROL-XL) 50 MG 24 hr tablet Take 50 mg by mouth daily. Take with or immediately following a meal.  Historical Provider, MD  morphine (MSIR) 15 MG tablet Take 1 tablet (15 mg total) by mouth 3 (three) times daily as needed (breakthrough pain). 03/13/16   Gildardo Cranker, DO  Morphine Sulfate (MORPHINE CONCENTRATE) 10 MG/0.5ML SOLN concentrated solution Take 0.5 mLs (10 mg total) by mouth 3 (three) times daily. 03/05/16   Robbie Lis, MD  NUTRITIONAL SUPPLEMENT LIQD Take 120 mLs by mouth 2 (two) times daily. MedPass     Historical Provider, MD  ondansetron (ZOFRAN) 4 MG tablet Take 4 mg by mouth every 8 (eight) hours as needed for nausea or vomiting.    Historical Provider, MD  polyethylene glycol (MIRALAX / GLYCOLAX) packet Take 17 g by mouth daily. Mix in 8 oz liquid and drink    Historical Provider, MD  potassium chloride SA (K-DUR,KLOR-CON) 20 MEQ tablet Take 20 mEq by mouth daily.    Historical Provider, MD  sennosides-docusate sodium (SENOKOT-S) 8.6-50 MG tablet Take 2 tablets by mouth 2 (two) times daily.    Historical Provider, MD  vitamin E 400 UNIT capsule Take 400 Units by mouth daily.    Historical Provider, MD    Physical Exam: Vitals:   03/25/2016 1546 04/12/2016 1635 04/15/2016 1645 03/23/2016  1700  BP: 146/98 141/83 133/82 140/82  Pulse: 75 (!) 56 71   Resp: 15 12 19 20   Temp:      TempSrc:      SpO2: 100% 100% 100%   Weight:      Height:          Constitutional: NAD, calm, comfortable Vitals:   04/10/2016 1546 04/08/2016 1635 04/03/2016 1645 03/23/2016 1700  BP: 146/98 141/83 133/82 140/82  Pulse: 75 (!) 56 71   Resp: 15 12 19 20   Temp:      TempSrc:      SpO2: 100% 100% 100%   Weight:      Height:       Eyes: PERRL, lids and conjunctivae normal, pallor ENMT: Mucous membranes are moist. Neck: obese, JVD not noted Respiratory: decrased BS at bases Cardiovascular: Regular rate and rhythm, no murmurs / rubs / gallops. 3plus edema/anasarca Abdomen: no tenderness, no masses palpated.  abd wall edema, Bowel sounds positive.  Musculoskeletal: both legs with UNNA boots Skin: no rashes, lesions, ulcers. No induration Neurologic: moves all ext, non ofcal Psychiatric: Normal judgment and insight. Alert and oriented x 3. Normal mood.   Labs on Admission: I have personally reviewed following labs and imaging studies  CBC:  Recent Labs Lab 03/29/2016 1625  WBC 4.7  HGB 9.3*  HCT 30.4*  MCV 105.2*  PLT 99991111*   Basic Metabolic Panel:  Recent Labs Lab 04/10/2016 1625  NA 139  K 4.0  CL 100*  CO2 28  GLUCOSE 92  BUN 35*  CREATININE 1.13*  CALCIUM 9.9   GFR: Estimated Creatinine Clearance: 43.1 mL/min (by C-G formula based on SCr of 1.13 mg/dL (H)). Liver Function Tests: No results for input(s): AST, ALT, ALKPHOS, BILITOT, PROT, ALBUMIN in the last 168 hours. No results for input(s): LIPASE, AMYLASE in the last 168 hours. No results for input(s): AMMONIA in the last 168 hours. Coagulation Profile: No results for input(s): INR, PROTIME in the last 168 hours. Cardiac Enzymes:  Recent Labs Lab 03/22/2016 1625  TROPONINI <0.03   BNP (last 3 results) No results for input(s): PROBNP in the last 8760 hours. HbA1C: No results for input(s): HGBA1C in the last 72  hours. CBG: No results for input(s): GLUCAP in the  last 168 hours. Lipid Profile: No results for input(s): CHOL, HDL, LDLCALC, TRIG, CHOLHDL, LDLDIRECT in the last 72 hours. Thyroid Function Tests: No results for input(s): TSH, T4TOTAL, FREET4, T3FREE, THYROIDAB in the last 72 hours. Anemia Panel: No results for input(s): VITAMINB12, FOLATE, FERRITIN, TIBC, IRON, RETICCTPCT in the last 72 hours. Urine analysis:    Component Value Date/Time   COLORURINE YELLOW 02/18/2016 2358   APPEARANCEUR CLEAR 02/18/2016 2358   LABSPEC 1.010 02/18/2016 2358   PHURINE 5.0 02/18/2016 2358   GLUCOSEU NEGATIVE 02/18/2016 2358   HGBUR NEGATIVE 02/18/2016 2358   BILIRUBINUR NEGATIVE 02/18/2016 2358   KETONESUR NEGATIVE 02/18/2016 2358   PROTEINUR NEGATIVE 02/18/2016 2358   UROBILINOGEN 0.2 11/18/2014 0600   NITRITE NEGATIVE 02/18/2016 2358   LEUKOCYTESUR NEGATIVE 02/18/2016 2358   Sepsis Labs: @LABRCNTIP (procalcitonin:4,lacticidven:4) )No results found for this or any previous visit (from the past 240 hour(s)).   Radiological Exams on Admission: Dg Chest 2 View  Result Date: 04/02/2016 CLINICAL DATA:  Shortness of breath and peripheral edema for the past week. History of CHF, asthma-COPD, tuberculosis, colonic malignancy, nonsmoker. EXAM: CHEST  2 VIEW COMPARISON:  Portable chest x-ray of December 27, 2016 FINDINGS: The cardiac silhouette is enlarged and more conspicuous than on the previous study. The pulmonary vascularity is mildly prominent. The pulmonary interstitial markings are increased but not as conspicuously as on the previous study. There are bilateral pleural effusions layering posteriorly. There is calcification in the wall of the aortic arch. The observed bony thorax is unremarkable. IMPRESSION: Bilateral pleural effusions layering posteriorly new since February 18, 2016. Increased cardiac silhouette size may reflect cardiac chamber dilation or a pericardial effusion or combination of both.  Mild pulmonary interstitial prominence is noted but has improved since the previous study. An echocardiogram may be useful. Thoracic aortic atherosclerosis. Electronically Signed   By: David  Martinique M.D.   On: 03/30/2016 16:26    EKG: Independently reviewed. NSR, low voltage  Assessment/Plan    Acute on chronic diastolic CHF (congestive heart failure) (HCC) with ANASARCA -with severe TR and chronic R heart failure -weight about 25lbs higher than admission last month ( 165lbs on 02/19/16) now 190lbs -needs aggressive diuresis, IV lasix 80mg  q12, CHF team/cards consulted -ECHO repeated -pending, at baseline has normal EF and severe TR -monitor I/O, weights -check albumin  Cor/Pulmonale, Pulm HTN/Chronic resp failure -overloaded as above  COPD/Chronci resp failure -nebs PRN   Chronic pain syndrome -resume morphine IR per home regimen   Lymphedema with chronic pain -as above, UNNA boots -this part will not change with diuresis    Anemia in chronic kidney disease -stable, monitor   CKD 3 -stable, monitor with diuresis  P afib -not on anticoagulation due to falls -continue toprol, CHADsvasc score>3  DVT prophylaxis: lovenox Code Status: DNR after extensive discussion with patient Family Communication: none at bedside Disposition Plan: inpatient Consults called: Cardiology Admission status: inpatient   Domenic Polite MD [facility] Pager [phone]  If 7PM-7AM, please contact night-coverage [email or web address] Password [facility]  04/03/2016, 6:05 PM

## 2016-04-14 NOTE — Consult Note (Addendum)
CARDIOLOGY CONSULT NOTE      Patient ID: DASIYA HELDER MRN: [id number] DOB/AGE: 19-Jul-1934 82 y.o.  Admit date: 03/24/2016 Referring PhysicianWhitney Maryan Rued, MD Primary PhysicianRichard Sandrea Hughs, MD Primary Cardiologist Dr. Sallyanne Kuster Reason for Consultation shortness of breath, edema  HPI: 82 y.o. female with permanent atrial fibrillation, diastolic heart failure, mild coronary artery disease, COPD and chronic kidney disease stage III as well as leg lymphedema.    She has been at [facility] place, using oxygen 2-3L/min and has intermittent SHOB.  Over the past few weeks, she has developed worsening SHOB.  She has noticed increasing swelling in her arm, face and abdomen.  Her legs are chronically wrapped due to lymphedema.    She knew she needed diuresis and came to the hospital.   Decreased appetite when she has fluid in her abdomen.    Review of systems complete and found to be negative unless listed above   Past Medical History:  Diagnosis Date  . Abnormality of gait 01/30/2015  . Anemia in CKD (chronic kidney disease)   . Anxiety   . Arthritis   . Asthma    On nebulizer  . Atrial fibrillation, chronic ([place])    a. Rate controlled w/ BB. No OAC 2/2 falls and gait instability.  . Bowel obstruction    Caused by scar tissue  . Chronic cor pulmonale (HCC)    Most likely due to COPD (cannot rule out contribution of diastolic LV dysfunction). ECHO 07/30/11 pulmonary artery pressure was estimated to be 50-60% mmHg & there was moderate tricuspid insufficiency.  . Chronic diastolic CHF (congestive heart failure) ([place])    a. Predominantly right heart failure due to cor pulmonale & in turn this is most likely due to untreated OSA;  b. 05/2013 Echo: EF 55-60%, no rwma, mild to mod MR, sev dil LA, mod to sev dil RV w/ reduced systolic fxn, massively dil RA, mod-sev TR, PASP 31mmHg.  [name] Chronic pain syndrome   . Colon cancer ([place])    Carcinoma of sigmoid colon 1984 & 1985.  [name] COPD (chronic  obstructive pulmonary disease) ([place])   . Coronary artery disease    a. occluded proximal diagonal artery on angiography 2004  . Depression   . Diverticulosis of colon   . 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 Dyspnea on exertion    Reluctant to take diuretics  . Edema of both legs    Venous Doppler 05/11/12 was normal & no evidence of thrombus or thrombophlebitis. Chronic LE edema related to both cor pulmonale & lymphedema, hyperlipidemia & HTN involving coronary atherosclerosis by cardiac cath in 2004. Cath showed a tiny old occlusion of the optional diagonal branch & also proximal LAD.  [name] Frequent falls    Possibly due to orthostatic hypotension but we have not seen that on our visits. Recent fall 05/2012 with scalp hematoma, swollen left LE, & black eyes.  . Hyperlipidemia    On a statin. Catheterization 02/12/03 Showed a tiny old occlusion of the optional diagonal branch & also proximal LAD.  [name] Hypertension   . Hypothyroidism 2002   On hormone replacement therapy  . IBS (irritable bowel syndrome)   . Insomnia   . Kidney failure 09/05/2015   Pt states that she went to the doctor on thursday and was told she is in stage 4 kidney failure  . Lung nodule   . Milroy's disease    Lymph[facility] at [facility]  . Obstructive sleep apnea    Polysomnogram 09/25/11  AHI: 53.6/hr overall & 57.1/hr during REM sleep. Poor compliance previously with CPAP  . Pulmonary hypertension    Pullmonary hypertension, chronic cor pulmonale w/pulmonary artery pressures of 50&#x2011; 60 mmHg.  [name] Refusal of blood transfusions as patient is Jehovah's Witness   . Tuberculosis     Family History  Problem Relation Age of Onset  . Heart disease Father   . Alzheimer's disease Mother   . Parkinsonism Brother   . Diabetes Brother     Social History   Social History  . Marital status: Married    Spouse name: Georgiann Mccoy  . Number of children: 3  . Years of education: HS   Occupational History  .  Retired    Social History Main Topics  . Smoking status: Never Smoker  . Smokeless tobacco: Never Used  . Alcohol use No  . Drug use: No  . Sexual activity: No   Other Topics Concern  . Not on file   Social History Narrative   Patient is married Georgiann Mccoy) and lives at home with her husband.   Patient is retired.   Patient has three children and her husband has two children.   Patient is right-handed.   Patient drinks very little caffeine (1/2 cup daily)   Patient has a high school education.    Past Surgical History:  Procedure Laterality Date  . ABDOMINAL HYSTERECTOMY  1983  . APPENDECTOMY  1956  . CARDIAC CATHETERIZATION  02/12/03   Showed a tiny old occlusion of the optional diagonal branch & also proximal LAD.  [name] CHOLECYSTECTOMY  1984  . COLON BIOPSY    . COLON RESECTION     2 times  . COLONOSCOPY  07/23/10      (Not in a hospital admission)  Physical Exam: Vitals:   Vitals:   03/28/2016 1546 03/19/2016 1635 04/04/2016 1645 03/22/2016 1700  BP: 146/98 141/83 133/82 140/82  Pulse: 75 (!) 56 71   Resp: 15 12 19 20   Temp:      TempSrc:      SpO2: 100% 100% 100%   Weight:      Height:       I&O's:  No intake or output data in the 24 hours ending 03/24/2016 1737 Physical exam:  [place]/AT EOMI + JVD, No carotid bruit; distended veins on her forehead RRR S1S2  Scant bilateral wheezing Soft. NT, nondistended Bilateral arm and leg edema.  Legs wrapped No focal motor or sensory deficits Normal affect Small erythematous areas on hands  Labs:   Lab Results  Component Value Date   WBC 4.7 04/06/2016   HGB 9.3 (L) 04/05/2016   HCT 30.4 (L) 04/10/2016   MCV 105.2 (H) 04/03/2016   PLT 114 (L) 04/03/2016    Recent Labs Lab 04/01/2016 1625  NA 139  K 4.0  CL 100*  CO2 28  BUN 35*  CREATININE 1.13*  CALCIUM 9.9  GLUCOSE 92   Lab Results  Component Value Date   CKTOTAL 244 (H) 09/09/2015   CKMB 2.2 03/12/2008   TROPONINI <0.03 02/26/2016    Lab Results  Component Value  Date   CHOL 59 09/10/2015   CHOL  03/12/2008    105        ATP III CLASSIFICATION:  <200     mg/dL   Desirable  200-239  mg/dL   Borderline High  >=240    mg/dL   High          Lab Results  Component Value Date  HDL 32 (L) 09/10/2015   HDL 35 (L) 03/12/2008   Lab Results  Component Value Date   LDLCALC 23 09/10/2015   [place]  03/12/2008    55        Total Cholesterol/HDL:CHD Risk Coronary Heart Disease Risk Table                     Men   Women  1/2 Average Risk   3.4   3.3  Average Risk       5.0   4.4  2 X Average Risk   9.6   7.1  3 X Average Risk  23.4   11.0        Use the calculated Patient Ratio above and the CHD Risk Table to determine the patient's CHD Risk.        ATP III CLASSIFICATION (LDL):  <100     mg/dL   Optimal  100-129  mg/dL   Near or Above                    Optimal  130-159  mg/dL   Borderline  160-189  mg/dL   High  >190     mg/dL   Very High   Lab Results  Component Value Date   TRIG 22 09/10/2015   TRIG 73 03/12/2008   Lab Results  Component Value Date   CHOLHDL 1.8 09/10/2015   CHOLHDL 3.0 03/12/2008   No results found for: LDLDIRECT    Radiology:increased heart size on chest xray; There are bilateral pleural effusions layering posteriorly. EKG: AFib, low voltage, NSST  ASSESSMENT AND PLAN:  Active Problems:   * No active hospital problems. * Acute on chronic systolic heart failure of right ventricle  Diuresis for volume overload.  Dilated RV with severe TR known and contributing to chronic right heart failure. Follow renal function given CKD.  Start with IV Lasix 80 mg IV BID.    SHe has pain when she has fluid shifts.  WOuld use prn morphine to treat.  THis may also help increase venous capacity.  Lymphedema: Chronic.  Will not resolve with diuresis.  AFib: rate controlled.  Not on anticoagulation due to falls.  Signed:   Mina Marble, MD, [facility] 03/27/2016, 5:37 PM

## 2016-04-14 NOTE — Progress Notes (Signed)
  Echocardiogram 2D Echocardiogram has been performed.  Victoria Fisher 03/29/2016, 6:36 PM

## 2016-04-14 NOTE — ED Notes (Signed)
Unsuccessful IV attempt and blood draw x 2.
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
## 2016-04-15 ENCOUNTER — Other Ambulatory Visit: Payer: Self-pay | Admitting: Licensed Clinical Social Worker

## 2016-04-15 DIAGNOSIS — I5021 Acute systolic (congestive) heart failure: Secondary | ICD-10-CM

## 2016-04-15 DIAGNOSIS — R600 Localized edema: Secondary | ICD-10-CM

## 2016-04-15 DIAGNOSIS — R931 Abnormal findings on diagnostic imaging of heart and coronary circulation: Secondary | ICD-10-CM

## 2016-04-15 DIAGNOSIS — I2781 Cor pulmonale (chronic): Secondary | ICD-10-CM

## 2016-04-15 LAB — MRSA PCR SCREENING: MRSA BY PCR: NEGATIVE

## 2016-04-15 LAB — BASIC METABOLIC PANEL
Anion gap: 9 (ref 5–15)
BUN: 34 mg/dL — ABNORMAL HIGH (ref 6–20)
CHLORIDE: 101 mmol/L (ref 101–111)
CO2: 29 mmol/L (ref 22–32)
Calcium: 9.8 mg/dL (ref 8.9–10.3)
Creatinine, Ser: 1.02 mg/dL — ABNORMAL HIGH (ref 0.44–1.00)
GFR, EST AFRICAN AMERICAN: 59 mL/min — AB (ref >60)
GFR, EST NON AFRICAN AMERICAN: 51 mL/min — AB (ref >60)
Glucose, Bld: 80 mg/dL (ref 65–99)
POTASSIUM: 4 mmol/L (ref 3.5–5.1)
SODIUM: 139 mmol/L (ref 135–145)

## 2016-04-15 LAB — CBC
HCT: 30.1 % — ABNORMAL LOW (ref 36.0–46.0)
HEMOGLOBIN: 9.1 g/dL — AB (ref 12.0–15.0)
MCH: 31.7 pg (ref 26.0–34.0)
MCHC: 30.2 g/dL (ref 30.0–36.0)
MCV: 104.9 fL — ABNORMAL HIGH (ref 78.0–100.0)
PLATELETS: 126 10*3/uL — AB (ref 150–400)
RBC: 2.87 MIL/uL — AB (ref 3.87–5.11)
RDW: 15.8 % — ABNORMAL HIGH (ref 11.5–15.5)
WBC: 4.9 10*3/uL (ref 4.0–10.5)

## 2016-04-15 NOTE — Progress Notes (Signed)
[facility] TEAM 1 - Stepdown/ICU TEAM  Victoria Fisher  [id number] DOB: 1934/04/20 DOA: 04/08/2016 PCP: Haywood Pao, MD    Brief Narrative:  82 y.o. female with history of chronic diastolic CHF, cor pulmonale, Pulm HTN, COPD, chronic resp failure on 3-4L O2, HTN, chronic lymphedema, CAD, CKD Stage 3, Parox A. fib, chronic pain syndrome, COPD, frequent falls, hypothyroid and OSA who presented to ED from her SNF due to dyspnea and worsening edema from her legs to her abdomen despite being on lasix 80 and 40mg  daily.  She felt she was possibly 40lbs above her usual weight.  Subjective: The patient appears to be resting comfortably in a bedside chair.  She reports an ongoing sensation of dyspnea.  Her O2 saturations are 100% on nasal cannula oxygen at the time of my evaluation.  She denies chest pain or pressure.  She does report a sensation of fullness in her abdomen which she reports is related to her swelling.  Assessment & Plan:  Acute on chronic diastolic CHF with ANASARCA - Cor Pulmonale - Pulm HTN baseline weight ~75kg on 02/19/16  - EF 55-60% w/o WMA w/ severely dilated RV and RA and wide open TR regurgitation - attempts to diurese persist  Filed Weights   03/26/2016 1519 03/28/2016 2020 04/15/16 0309  Weight: 86.2 kg (190 lb) 92.7 kg (204 lb 5.9 oz) 92.3 kg (203 lb 7.8 oz)    COPD Well compensated at this time  Chronic pain syndrome No evidence of uncontrolled pain at this time  Lymphedema with chronic pain Patient is due for her Unna boots to be changed - will ask ortho techs   Anemia in chronic kidney disease Hgb is stable   CKD 3 Stable - follow w/ ongoing diuresis   Recent Labs Lab 03/30/2016 1625 04/15/16 0320  CREATININE 1.13* 1.02*   Parox Afib CHADsvasc score>3 - not on anticoagulation due to falls  DVT prophylaxis: Lovenox Code Status: DNR - NO CODE Family Communication: no family present at time of exam  Disposition Plan: SDU  Consultants:  [facility]  Cardiology   Procedures: 2/27 TTE   Antimicrobials:  None  Objective: Blood pressure 124/61, pulse 75, temperature 98.2 F (36.8 C), temperature source Oral, resp. rate 12, height 5\' 4"  (1.626 m), weight 92.3 kg (203 lb 7.8 oz), SpO2 99 %.  Intake/Output Summary (Last 24 hours) at 04/15/16 1537 Last data filed at 04/15/16 1316  Gross per 24 hour  Intake              100 ml  Output             1750 ml  Net            -1650 ml   Filed Weights   03/25/2016 1519 04/05/2016 2020 04/15/16 0309  Weight: 86.2 kg (190 lb) 92.7 kg (204 lb 5.9 oz) 92.3 kg (203 lb 7.8 oz)    Examination: General: No acute respiratory distress Lungs: bibasilar crackles - no wheeze  Cardiovascular: distant HS - reg rate - no gallup  Abdomen: Nontender, obese and distended, soft, bowel sounds positive, no rebound, no ascites, no appreciable mass Extremities: 2+ anasarca  CBC:  Recent Labs Lab 03/19/2016 1625 04/15/16 0320  WBC 4.7 4.9  HGB 9.3* 9.1*  HCT 30.4* 30.1*  MCV 105.2* 104.9*  PLT 114* 123XX123*   Basic Metabolic Panel:  Recent Labs Lab 03/27/2016 1625 04/15/16 0320  NA 139 139  K 4.0 4.0  CL 100* 101  CO2 28 29  GLUCOSE 92 80  BUN 35* 34*  CREATININE 1.13* 1.02*  CALCIUM 9.9 9.8   GFR: Estimated Creatinine Clearance: 48.4 mL/min (by C-G formula based on SCr of 1.02 mg/dL (H)).  Liver Function Tests:  Recent Labs Lab 04/15/2016 1625  ALBUMIN 3.0*   Cardiac Enzymes:  Recent Labs Lab 04/08/2016 1625  TROPONINI <0.03    HbA1C: Hgb A1c MFr Bld  Date/Time Value Ref Range Status  03/12/2008 04:00 AM  4.6 - 6.1 % Final   6.1 (NOTE)   The ADA recommends the following therapeutic goal for glycemic   control related to Hgb A1C measurement:   Goal of Therapy:   < 7.0% Hgb A1C   Reference: American Diabetes Association: Clinical Practice   Recommendations 2008, Diabetes Care,  2008, 31:(Suppl 1).    CBG: No results for input(s): GLUCAP in the last 168 hours.  Recent Results (from  the past 240 hour(s))  MRSA PCR Screening     Status: None   Collection Time: 04/12/2016  8:53 PM  Result Value Ref Range Status   MRSA by PCR NEGATIVE NEGATIVE Final    Comment:        The GeneXpert MRSA Assay (FDA approved for NASAL specimens only), is one component of a comprehensive MRSA colonization surveillance program. It is not intended to diagnose MRSA infection nor to guide or monitor treatment for MRSA infections.      Scheduled Meds: . enoxaparin (LOVENOX) injection  40 mg Subcutaneous Q24H  . furosemide  80 mg Intravenous Q12H  . levothyroxine  125 mcg Oral QAC breakfast  . metoprolol succinate  50 mg Oral Daily  . polyethylene glycol  17 g Oral Daily  . potassium chloride SA  40 mEq Oral BID  . senna-docusate  2 tablet Oral BID  . sodium chloride flush  3 mL Intravenous Q12H     LOS: 1 day   Cherene Altes, MD [facility] Office  [phone] Pager - Text Page per Amion as per below:  On-Call/Text Page:      [email or web address]      password [email or web address]  If 7PM-7AM, please contact night-coverage [email or web address] Password [facility] 04/15/2016, 3:37 PM

## 2016-04-15 NOTE — Patient Outreach (Signed)
[facility]) Care Management  04/15/2016  Victoria Fisher 04/09/1934 [id number]   Assessment- CSW arrived at [facility] to complete SNF visit but was informed that patient was not there. CSW completed chart review and became aware of patient's current hospitalization yesterday. Patient presented to the ED with CHF exacerbation and SOB. Patient had worsening edema extending from her legs to her abdomen. CSW completed call to patient's son and spoke to him. He is aware of patient's current hospitalization. He states that he is glad that patient knew she needed to go to the hospital. He states that prior to current hospitalization, that patient was going to be relocating to [place] on 04/17/16 but that this may have to be extended due to patient's current health complications. SNF social worker was out of the office today but CSW left a HIPPA compliant voice message requesting return call. CSW received return call from SNF social worker and discussed case and plan is still for patient to transfer to ALF once she is back at [facility] and stable for discharge.  Plan-CSW will folow up with patient within one-two weeks and continue to assist with social work needs.  [name], BSW, MSW, [email or web address] Phone: [phone] Fax: [phone]

## 2016-04-15 NOTE — Progress Notes (Signed)
Progress Note  Patient Name: DEBERA MATTI Date of Encounter: 04/15/2016  Primary Cardiologist: Dr. Sallyanne Kuster  Subjective   Feels a little better.  Still feels swollen and SHOB.  Inpatient Medications    Scheduled Meds: . enoxaparin (LOVENOX) injection  40 mg Subcutaneous Q24H  . furosemide  80 mg Intravenous Q12H  . levothyroxine  125 mcg Oral QAC breakfast  . metoprolol succinate  50 mg Oral Daily  . polyethylene glycol  17 g Oral Daily  . potassium chloride SA  40 mEq Oral BID  . senna-docusate  2 tablet Oral BID  . sodium chloride flush  3 mL Intravenous Q12H   Continuous Infusions:  PRN Meds: acetaminophen **OR** acetaminophen, bisacodyl, ipratropium-albuterol, lactulose, morphine, ondansetron **OR** ondansetron (ZOFRAN) IV   Vital Signs    Vitals:   04/02/2016 2324 04/15/16 0309 04/15/16 0837 04/15/16 1003  BP: 118/81 131/80 107/60 110/72  Pulse: 67   82  Resp: 12 12    Temp: 97.5 F (36.4 C) 97.5 F (36.4 C) 97.7 F (36.5 C)   TempSrc: Oral Oral Oral   SpO2: 100% 99%    Weight:  203 lb 7.8 oz (92.3 kg)    Height:        Intake/Output Summary (Last 24 hours) at 04/15/16 1023 Last data filed at 04/15/16 0900  Gross per 24 hour  Intake              100 ml  Output             1550 ml  Net            -1450 ml   Filed Weights   04/10/2016 1519 03/28/2016 2020 04/15/16 0309  Weight: 190 lb (86.2 kg) 204 lb 5.9 oz (92.7 kg) 203 lb 7.8 oz (92.3 kg)    Telemetry    AFib, rate controlled - Personally Reviewed  ECG    AFib, low voltage - Personally Reviewed  Physical Exam   GEN: No acute distress.   Neck: No JVD ; distended veins on forehead Cardiac: irregularly irregular, no murmurs, rubs, or gallops.  Respiratory: Clear to auscultation bilaterally. GI: Soft, nontender, non-distended  MS: Bilateral arm edema; No deformity. Legs wrapped Neuro:  Nonfocal  Psych: Normal affect   Labs    Chemistry Recent Labs Lab 03/19/2016 1625 04/15/16 0320    NA 139 139  K 4.0 4.0  CL 100* 101  CO2 28 29  GLUCOSE 92 80  BUN 35* 34*  CREATININE 1.13* 1.02*  CALCIUM 9.9 9.8  ALBUMIN 3.0*  --   GFRNONAA 45* 51*  GFRAA 52* 59*  ANIONGAP 11 9     Hematology Recent Labs Lab 04/06/2016 1625 04/15/16 0320  WBC 4.7 4.9  RBC 2.89* 2.87*  HGB 9.3* 9.1*  HCT 30.4* 30.1*  MCV 105.2* 104.9*  MCH 32.2 31.7  MCHC 30.6 30.2  RDW 15.8* 15.8*  PLT 114* 126*    Cardiac Enzymes Recent Labs Lab 03/26/2016 1625  TROPONINI <0.03    Recent Labs Lab 04/10/2016 1657  TROPIPOC 0.02     BNP Recent Labs Lab 04/09/2016 1625  BNP 384.4*     DDimer No results for input(s): DDIMER in the last 168 hours.   Radiology    Dg Chest 2 View  Result Date: 03/20/2016 CLINICAL DATA:  Shortness of breath and peripheral edema for the past week. History of CHF, asthma-COPD, tuberculosis, colonic malignancy, nonsmoker. EXAM: CHEST  2 VIEW COMPARISON:  Portable chest x-ray of  December 27, 2016 FINDINGS: The cardiac silhouette is enlarged and more conspicuous than on the previous study. The pulmonary vascularity is mildly prominent. The pulmonary interstitial markings are increased but not as conspicuously as on the previous study. There are bilateral pleural effusions layering posteriorly. There is calcification in the wall of the aortic arch. The observed bony thorax is unremarkable. IMPRESSION: Bilateral pleural effusions layering posteriorly new since February 18, 2016. Increased cardiac silhouette size may reflect cardiac chamber dilation or a pericardial effusion or combination of both. Mild pulmonary interstitial prominence is noted but has improved since the previous study. An echocardiogram may be useful. Thoracic aortic atherosclerosis. Electronically Signed   By: David  Martinique M.D.   On: 03/23/2016 16:26    Cardiac Studies   Echo: no pericardial effusion; dilated right heart  Patient Profile     82 y.o. female with severe right heart failure,  edema  Assessment & Plan    1) Acute on chronic systolic right heart failure:  Continue Lasix.  Watch renal function.  May need PT consult.  She is still quite volume overloaded.  THis will take time to improve.  Right heart structural problems predispose her to this scenario to repeat.  D/w Dr. Loletha Grayer yesterday.  Patient has had issues coming to appointments with him and CHF clinic.  COmpliance as an outpatient will be key to avoid future admissions.  Signed, Larae Grooms, MD  04/15/2016, 10:23 AM

## 2016-04-15 NOTE — Progress Notes (Signed)
Orthopedic Tech Progress Note Patient Details:  Victoria Fisher Jul 11, 1934 [id number]  Ortho Devices Type of Ortho Device: Louretta Parma boot Ortho Device/Splint Location: (B) LE Ortho Device/Splint Interventions: Ordered, Application   Braulio Bosch 04/15/2016, 6:52 PM

## 2016-04-16 LAB — BASIC METABOLIC PANEL
Anion gap: 8 (ref 5–15)
BUN: 35 mg/dL — ABNORMAL HIGH (ref 6–20)
CALCIUM: 9.6 mg/dL (ref 8.9–10.3)
CO2: 29 mmol/L (ref 22–32)
Chloride: 101 mmol/L (ref 101–111)
Creatinine, Ser: 1.1 mg/dL — ABNORMAL HIGH (ref 0.44–1.00)
GFR, EST AFRICAN AMERICAN: 53 mL/min — AB (ref >60)
GFR, EST NON AFRICAN AMERICAN: 46 mL/min — AB (ref >60)
Glucose, Bld: 77 mg/dL (ref 65–99)
POTASSIUM: 4.1 mmol/L (ref 3.5–5.1)
Sodium: 138 mmol/L (ref 135–145)

## 2016-04-16 MED ORDER — TRAZODONE HCL 50 MG PO TABS
25.0000 mg | ORAL_TABLET | Freq: Once | ORAL | Status: AC
  Administered 2016-04-16: 25 mg via ORAL
  Filled 2016-04-16: qty 1

## 2016-04-16 MED ORDER — FUROSEMIDE 10 MG/ML IJ SOLN
80.0000 mg | Freq: Three times a day (TID) | INTRAMUSCULAR | Status: DC
  Administered 2016-04-16 – 2016-04-20 (×13): 80 mg via INTRAVENOUS
  Filled 2016-04-16 (×13): qty 8

## 2016-04-16 NOTE — Progress Notes (Signed)
PROGRESS NOTE                                                                                                                                                                                                             Patient Demographics:    Victoria Fisher, is a 82 y.o. female, DOB - 1934/08/03, [id number]  Admit date - 04/03/2016   Admitting Physician Domenic Polite, MD  Outpatient Primary MD for the patient is Haywood Pao, MD  LOS - 2  Outpatient Specialists: Dr Sallyanne Kuster  Chief Complaint  Patient presents with  . Congestive Heart Failure       Brief Narrative   82 y.o.femalewith history of chronic diastolic CHF, cor pulmonale, Pulm HTN, COPD, chronic resp failure on 3-4L O2, HTN, chronic lymphedema, CAD, CKD Stage 3, Parox A. fib, chronic pain syndrome, COPD, frequent falls, hypothyroid and OSA who presented to ED from her SNF due to dyspnea and worsening edema from her legs to her abdomen despite being on lasix 80 and 40mg  daily.  She felt she was possibly 40lbs above her usual weight.  Patient admitted to stepdown unit for anasarca requiring diuresis.   Subjective:   Patient reports her breathing to be unchanged. Has not diuresed that well. Still edematous   Assessment  & Plan :   Principal problem Acute on chronic diastolic CHF ([place]) with anasarca Has severe TR and chronic right heart failure. Weight 35 pounds >baseline (165 lbs in January). Repeat echo shows EF 55-60% with no wall motion abnormality, wide open TR and pulmonary hypertension Will increase IV Lasix to 80 mg every 8 hours. Cardiology following. Continue strict I/O and daily weight. Fluid restriction. Monitor K and mag.  Paroxysmal A. fib Not on anticoagulation due to falls.   COPD Compensated.  Lymphedema with chronic pain Unna boot changed  by orthopedic Tech  Anemia of chronic kidney disease Stable   Code Status  :DNR  Family Communication  : None at bedside  Disposition Plan  : PT evaluation.   Barriers For Discharge : Ongoing symptoms. May need several days for diuresis. Continue step down monitoring for today.  Consults  :  Cardiology  Procedures  : None  DVT Prophylaxis  :  Lovenox -   Lab Results  Component Value Date   PLT 126 (L) 04/15/2016  Antibiotics  :    Anti-infectives    None        Objective:   Vitals:   04/16/16 0440 04/16/16 0806 04/16/16 0934 04/16/16 1250  BP:  109/70 111/67   Pulse:  77 76 73  Resp:  13  13  Temp:  97.7 F (36.5 C)  98.3 F (36.8 C)  TempSrc:  Oral  Oral  SpO2:  100%  100%  Weight: 91.4 kg (201 lb 8 oz)     Height:        Wt Readings from Last 3 Encounters:  04/16/16 91.4 kg (201 lb 8 oz)  04/10/16 86.2 kg (190 lb)  03/12/16 71.8 kg (158 lb 6.4 oz)     Intake/Output Summary (Last 24 hours) at 04/16/16 1413 Last data filed at 04/16/16 1008  Gross per 24 hour  Intake              120 ml  Output              500 ml  Net             -380 ml     Physical Exam  Gen: not in distress HEENT: moist mucosa, supple neck Chest: Increased bibasilar breath sounds CVS: N S1&S2, no murmurs,  GI: soft, nontender, pitting abdominal edema, foley+ Musculoskeletal: warm,3+ pitting edema     Data Review:    CBC  Recent Labs Lab 03/30/2016 1625 04/15/16 0320  WBC 4.7 4.9  HGB 9.3* 9.1*  HCT 30.4* 30.1*  PLT 114* 126*  MCV 105.2* 104.9*  MCH 32.2 31.7  MCHC 30.6 30.2  RDW 15.8* 15.8*    Chemistries   Recent Labs Lab 04/01/2016 1625 04/15/16 0320 04/16/16 0648  NA 139 139 138  K 4.0 4.0 4.1  CL 100* 101 101  CO2 28 29 29   GLUCOSE 92 80 77  BUN 35* 34* 35*  CREATININE 1.13* 1.02* 1.10*  CALCIUM 9.9 9.8 9.6   ------------------------------------------------------------------------------------------------------------------ No results for input(s): CHOL, HDL, LDLCALC, TRIG, CHOLHDL, LDLDIRECT in the last 72  hours.  Lab Results  Component Value Date   HGBA1C  03/12/2008    6.1 (NOTE)   The ADA recommends the following therapeutic goal for glycemic   control related to Hgb A1C measurement:   Goal of Therapy:   < 7.0% Hgb A1C   Reference: American Diabetes Association: Clinical Practice   Recommendations 2008, Diabetes Care,  2008, 31:(Suppl 1).   ------------------------------------------------------------------------------------------------------------------ No results for input(s): TSH, T4TOTAL, T3FREE, THYROIDAB in the last 72 hours.  Invalid input(s): FREET3 ------------------------------------------------------------------------------------------------------------------ No results for input(s): VITAMINB12, FOLATE, FERRITIN, TIBC, IRON, RETICCTPCT in the last 72 hours.  Coagulation profile No results for input(s): INR, PROTIME in the last 168 hours.  No results for input(s): DDIMER in the last 72 hours.  Cardiac Enzymes  Recent Labs Lab 04/08/2016 1625  TROPONINI <0.03   ------------------------------------------------------------------------------------------------------------------    Component Value Date/Time   BNP 384.4 (H) 04/03/2016 1625    Inpatient Medications  Scheduled Meds: . enoxaparin (LOVENOX) injection  40 mg Subcutaneous Q24H  . furosemide  80 mg Intravenous Q8H  . levothyroxine  125 mcg Oral QAC breakfast  . metoprolol succinate  50 mg Oral Daily  . polyethylene glycol  17 g Oral Daily  . potassium chloride SA  40 mEq Oral BID  . senna-docusate  2 tablet Oral BID  . sodium chloride flush  3 mL Intravenous Q12H   Continuous Infusions: PRN Meds:.acetaminophen **OR** acetaminophen, bisacodyl,  ipratropium-albuterol, lactulose, morphine, ondansetron **OR** ondansetron (ZOFRAN) IV  Micro Results Recent Results (from the past 240 hour(s))  MRSA PCR Screening     Status: None   Collection Time: 04/15/2016  8:53 PM  Result Value Ref Range Status   MRSA by PCR  NEGATIVE NEGATIVE Final    Comment:        The GeneXpert MRSA Assay (FDA approved for NASAL specimens only), is one component of a comprehensive MRSA colonization surveillance program. It is not intended to diagnose MRSA infection nor to guide or monitor treatment for MRSA infections.     Radiology Reports Dg Chest 2 View  Result Date: 03/21/2016 CLINICAL DATA:  Shortness of breath and peripheral edema for the past week. History of CHF, asthma-COPD, tuberculosis, colonic malignancy, nonsmoker. EXAM: CHEST  2 VIEW COMPARISON:  Portable chest x-ray of December 27, 2016 FINDINGS: The cardiac silhouette is enlarged and more conspicuous than on the previous study. The pulmonary vascularity is mildly prominent. The pulmonary interstitial markings are increased but not as conspicuously as on the previous study. There are bilateral pleural effusions layering posteriorly. There is calcification in the wall of the aortic arch. The observed bony thorax is unremarkable. IMPRESSION: Bilateral pleural effusions layering posteriorly new since February 18, 2016. Increased cardiac silhouette size may reflect cardiac chamber dilation or a pericardial effusion or combination of both. Mild pulmonary interstitial prominence is noted but has improved since the previous study. An echocardiogram may be useful. Thoracic aortic atherosclerosis. Electronically Signed   By: David  Martinique M.D.   On: 03/29/2016 16:26    Time Spent in minutes  35   Louellen Molder M.D on 04/16/2016 at 2:13 PM  Between 7am to 7pm - Pager - [phone]  After 7pm go to [email or web address] - password [facility]  [facility] -  Office  [phone]

## 2016-04-16 NOTE — Progress Notes (Signed)
Progress Note  Patient Name: Victoria Fisher Date of Encounter: 04/16/2016  Primary Cardiologist: Dr. Sallyanne Kuster  Subjective   Feels a little better.  Still feels swollen and SHOB.  Inpatient Medications    Scheduled Meds: . enoxaparin (LOVENOX) injection  40 mg Subcutaneous Q24H  . furosemide  80 mg Intravenous Q8H  . levothyroxine  125 mcg Oral QAC breakfast  . metoprolol succinate  50 mg Oral Daily  . polyethylene glycol  17 g Oral Daily  . potassium chloride SA  40 mEq Oral BID  . senna-docusate  2 tablet Oral BID  . sodium chloride flush  3 mL Intravenous Q12H   Continuous Infusions:  PRN Meds: acetaminophen **OR** acetaminophen, bisacodyl, ipratropium-albuterol, lactulose, morphine, ondansetron **OR** ondansetron (ZOFRAN) IV   Vital Signs    Vitals:   04/16/16 0404 04/16/16 0440 04/16/16 0806 04/16/16 0934  BP: 100/66  109/70 111/67  Pulse:   77 76  Resp: 11  13   Temp:   97.7 F (36.5 C)   TempSrc:   Oral   SpO2: 100%  100%   Weight:  201 lb 8 oz (91.4 kg)    Height:        Intake/Output Summary (Last 24 hours) at 04/16/16 1030 Last data filed at 04/16/16 1008  Gross per 24 hour  Intake              120 ml  Output              900 ml  Net             -780 ml   Filed Weights   03/24/2016 2020 04/15/16 0309 04/16/16 0440  Weight: 204 lb 5.9 oz (92.7 kg) 203 lb 7.8 oz (92.3 kg) 201 lb 8 oz (91.4 kg)    Telemetry    AFib, rate controlled - Personally Reviewed  ECG    AFib, low voltage - Personally Reviewed from a few days ago  Physical Exam   GEN: No acute distress.   Neck: No JVD ; distended veins on forehead Cardiac: irregularly irregular, no murmurs, rubs, or gallops.  Respiratory: Clear to auscultation bilaterally. GI: Soft, nontender, non-distended  MS: Bilateral arm edema; No deformity. Legs wrapped Neuro:  Nonfocal  Psych: Normal affect   Labs    Chemistry  Recent Labs Lab 04/10/2016 1625 04/15/16 0320 04/16/16 0648  NA 139 139  138  K 4.0 4.0 4.1  CL 100* 101 101  CO2 28 29 29   GLUCOSE 92 80 77  BUN 35* 34* 35*  CREATININE 1.13* 1.02* 1.10*  CALCIUM 9.9 9.8 9.6  ALBUMIN 3.0*  --   --   GFRNONAA 45* 51* 46*  GFRAA 52* 59* 53*  ANIONGAP 11 9 8      Hematology  Recent Labs Lab 04/03/2016 1625 04/15/16 0320  WBC 4.7 4.9  RBC 2.89* 2.87*  HGB 9.3* 9.1*  HCT 30.4* 30.1*  MCV 105.2* 104.9*  MCH 32.2 31.7  MCHC 30.6 30.2  RDW 15.8* 15.8*  PLT 114* 126*    Cardiac Enzymes  Recent Labs Lab 04/04/2016 1625  TROPONINI <0.03     Recent Labs Lab 04/10/2016 1657  TROPIPOC 0.02     BNP  Recent Labs Lab 03/23/2016 1625  BNP 384.4*     DDimer No results for input(s): DDIMER in the last 168 hours.   Radiology    Dg Chest 2 View  Result Date: 03/28/2016 CLINICAL DATA:  Shortness of breath and peripheral edema  for the past week. History of CHF, asthma-COPD, tuberculosis, colonic malignancy, nonsmoker. EXAM: CHEST  2 VIEW COMPARISON:  Portable chest x-ray of December 27, 2016 FINDINGS: The cardiac silhouette is enlarged and more conspicuous than on the previous study. The pulmonary vascularity is mildly prominent. The pulmonary interstitial markings are increased but not as conspicuously as on the previous study. There are bilateral pleural effusions layering posteriorly. There is calcification in the wall of the aortic arch. The observed bony thorax is unremarkable. IMPRESSION: Bilateral pleural effusions layering posteriorly new since February 18, 2016. Increased cardiac silhouette size may reflect cardiac chamber dilation or a pericardial effusion or combination of both. Mild pulmonary interstitial prominence is noted but has improved since the previous study. An echocardiogram may be useful. Thoracic aortic atherosclerosis. Electronically Signed   By: David  Martinique M.D.   On: 03/19/2016 16:26    Cardiac Studies   Echo: no pericardial effusion; dilated right heart  Patient Profile     82 y.o. female  with severe right heart failure, edema  Assessment & Plan    1) Acute on chronic systolic right heart failure:  Continue Lasix at current dose.  Renal function stable.  May need PT consult.  She is still quite volume overloaded.  THis will take time to improve.  Right heart structural problems predispose her to this scenario to repeat.  I suspect she will be here for several more days.  Encourage ambulation with PT.  She is willing to try.  Signed, Larae Grooms, MD  04/16/2016, 10:30 AM

## 2016-04-16 NOTE — Consult Note (Signed)
   [facility] CM Inpatient Consult   04/16/2016  Victoria Fisher Jun 19, 1934 [id number]   Patient is currently active with [facility] for chronic disease management services.  Patient has been engaged by [facility] CSW.  Our community based plan of care has focused on disease management and community resource support with follow up support with the patient and family at [facility]. Active consent on file.   Will continue to follow for discharge planning needs as appropriate.  Inpatient Care Manager was not available at this time.  Will follow up.  Of note, [facility] Care Management services does not replace or interfere with any services that are needed or arranged by inpatient case management or social work.  For additional questions or referrals please contact:  Natividad Brood, RN BSN [facility] Liaison  [phone] business mobile phone Toll free office [phone]

## 2016-04-16 NOTE — Clinical Social Work Note (Signed)
Clinical Social Work Assessment  Patient Details  Name: Victoria Fisher MRN: [id number] Date of Birth: February 03, 1935  Date of referral:  04/16/16               Reason for consult:  Facility Placement                Permission sought to share information with:  Chartered certified accountant granted to share information::  Yes, Verbal Permission Granted  Name::     Immunologist::  [place]  Relationship::  spouse  Contact Information:     Housing/Transportation Living arrangements for the past 2 months:  [place], Charity fundraiser of Information:  Patient Patient Interpreter Needed:  None Criminal Activity/Legal Involvement Pertinent to Current Situation/Hospitalization:  No - Comment as needed Significant Relationships:  Spouse, Adult Children Lives with:  Spouse Do you feel safe going back to the place where you live?  Yes Need for family participation in patient care:  Yes (Comment) (help with decision making)  Care giving concerns:  Pt from SNF for short term rehab and is ok with returning if needed but not sure where she will DC to after rehab is finished.   Social Worker assessment / plan:  CSW met with pt to discuss plan for time of DC.  Pt reports that she and her husband were living at [facility]) prior to admission in January.  Pt states that both she and her husband were placed at [facility] place after that admission but pt husband DC'd after a few days and was placed in [place].  Pt is hopeful she can go be with her spouse after this admission but does not seem to know if she has a bed at [facility]- does report she cannot return to her [facility] apartment.   Employment status:  Retired Forensic scientist:  Medicare PT Recommendations:  Not assessed at this time Information / Referral to community resources:  [place]  Patient/Family's Response to care:  Pt is agreeable to return to  SNF if that is what is recommended but is hopeful she will be well enough to go to ALF with her spouse.  Patient/Family's Understanding of and Emotional Response to Diagnosis, Current Treatment, and Prognosis:  Pt is very hopeful that she will recover- unclear her level of understanding of her prognosis at this time.  Emotional Assessment Appearance:  Appears stated age Attitude/Demeanor/Rapport:    Affect (typically observed):  Appropriate Orientation:  Oriented to Self, Oriented to Place, Oriented to  Time, Oriented to Situation Alcohol / Substance use:  Not Applicable Psych involvement (Current and /or in the community):  No (Comment)  Discharge Needs  Concerns to be addressed:  Care Coordination Readmission within the last 30 days:  No Current discharge risk:  Physical Impairment Barriers to Discharge:  Continued Medical Work up   Jorge Ny, LCSW 04/16/2016, 11:55 AM

## 2016-04-16 NOTE — Evaluation (Signed)
Physical Therapy Evaluation Patient Details Name: Victoria Fisher MRN: [id number] DOB: April 17, 1934 Today's Date: 04/16/2016   History of Present Illness  Pt is an 82 y/o female admitted from SNF ([facility]) secondary to dyspnea and edema. PMH including but not limited to a-fib, CHF, COPD, CAD, and HTN.  Clinical Impression  Pt presented sitting OOB in recliner when PT entered room. Prior to admission, pt reported that she would ambulate short distances with RW and assistance from PT at her SNF. Pt stated that she was receiving PT 6x/week ("every day except Sundays"). Pt currently requires mod A x2 to perform transfers. Pt very limited this session secondary to anxiety, dizziness and nausea with standing. All VSS throughout. Pt would continue to benefit from skilled physical therapy services at this time while admitted and after d/c to address her below listed limitations in order to improve her overall safety and independence with functional mobility.      Follow Up Recommendations SNF    Equipment Recommendations  None recommended by PT    Recommendations for Other Services       Precautions / Restrictions Precautions Precautions: Fall Restrictions Weight Bearing Restrictions: No      Mobility  Bed Mobility               General bed mobility comments: pt sitting OOB in recliner when PT entered  Transfers Overall transfer level: Needs assistance Equipment used: Rolling walker (2 wheeled) Transfers: Sit to/from Stand Sit to Stand: Mod assist;+2 physical assistance         General transfer comment: increased time, VC'ing for technique and mod A x2 to rise from recliner (attempted unsuccesfully x2, successful with 3rd attempt)  Ambulation/Gait             General Gait Details: attempted Egress Test but pt reported dizziness and nausea, became very anxious and requested to sit down  Stairs            Wheelchair Mobility    Modified Rankin (Stroke  Patients Only)       Balance Overall balance assessment: Needs assistance Sitting-balance support: Feet supported Sitting balance-Leahy Scale: Fair     Standing balance support: During functional activity;Bilateral upper extremity supported Standing balance-Leahy Scale: Poor Standing balance comment: pt reliant on bilateral UEs on RW                             Pertinent Vitals/Pain Pain Assessment: Faces Faces Pain Scale: Hurts little more Pain Location: upper back Pain Descriptors / Indicators: Sore Pain Intervention(s): Monitored during session;Repositioned    Home Living Family/patient expects to be discharged to:: Skilled nursing facility                      Prior Function Level of Independence: Needs assistance   Gait / Transfers Assistance Needed: ambulates with RW and assist from PT (per pt's report)           Hand Dominance        Extremity/Trunk Assessment   Upper Extremity Assessment Upper Extremity Assessment: Overall WFL for tasks assessed    Lower Extremity Assessment Lower Extremity Assessment: Generalized weakness       Communication   Communication: No difficulties  Cognition Arousal/Alertness: Awake/alert Behavior During Therapy: Anxious Overall Cognitive Status: Within Functional Limits for tasks assessed                 General Comments:  pt alert and oriented, following commands appropriately throughout    General Comments      Exercises General Exercises - Lower Extremity Ankle Circles/Pumps: AROM;Both;20 reps;Seated Heel Slides: AAROM;Both;10 reps;Seated Hip ABduction/ADduction: AAROM;Both;10 reps;Seated   Assessment/Plan    PT Assessment Patient needs continued PT services  PT Problem List Decreased strength;Decreased activity tolerance;Decreased balance;Decreased mobility;Decreased coordination;Decreased knowledge of use of DME;Decreased safety awareness;Cardiopulmonary status limiting  activity       PT Treatment Interventions DME instruction;Gait training;Functional mobility training;Therapeutic activities;Therapeutic exercise;Balance training;Neuromuscular re-education;Patient/family education    PT Goals (Current goals can be found in the Care Plan section)  Acute Rehab PT Goals Patient Stated Goal: return to [facility] PT Goal Formulation: With patient Time For Goal Achievement: 04/30/16 Potential to Achieve Goals: Good    Frequency Min 2X/week   Barriers to discharge        Co-evaluation               End of Session Equipment Utilized During Treatment: Gait belt;Oxygen Activity Tolerance: Patient limited by fatigue Patient left: in chair;with call bell/phone within reach Nurse Communication: Mobility status PT Visit Diagnosis: Other abnormalities of gait and mobility (R26.89)         Time: [id number] PT Time Calculation (min) (ACUTE ONLY): 26 min   Charges:   PT Evaluation $PT Eval Moderate Complexity: 1 Procedure PT Treatments $Therapeutic Activity: 8-22 mins   PT G CodesClearnce Sorrel Cordel Drewes 04/16/2016, 5:06 PM Sherie Don, [place], DPT [phone]
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
## 2016-04-16 DEATH — deceased

## 2016-04-17 LAB — BASIC METABOLIC PANEL
ANION GAP: 9 (ref 5–15)
BUN: 34 mg/dL — ABNORMAL HIGH (ref 6–20)
CALCIUM: 9.5 mg/dL (ref 8.9–10.3)
CHLORIDE: 100 mmol/L — AB (ref 101–111)
CO2: 27 mmol/L (ref 22–32)
Creatinine, Ser: 1.17 mg/dL — ABNORMAL HIGH (ref 0.44–1.00)
GFR calc non Af Amer: 43 mL/min — ABNORMAL LOW (ref >60)
GFR, EST AFRICAN AMERICAN: 50 mL/min — AB (ref >60)
GLUCOSE: 85 mg/dL (ref 65–99)
POTASSIUM: 4.5 mmol/L (ref 3.5–5.1)
Sodium: 136 mmol/L (ref 135–145)

## 2016-04-17 MED ORDER — TRAZODONE HCL 50 MG PO TABS
50.0000 mg | ORAL_TABLET | Freq: Every evening | ORAL | Status: DC | PRN
  Administered 2016-04-17 – 2016-04-19 (×2): 50 mg via ORAL
  Filled 2016-04-17: qty 1

## 2016-04-17 MED ORDER — TRAZODONE HCL 50 MG PO TABS
25.0000 mg | ORAL_TABLET | Freq: Every evening | ORAL | Status: DC | PRN
  Filled 2016-04-17: qty 1

## 2016-04-17 NOTE — Care Management Important Message (Signed)
Important Message  Patient Details  Name: Victoria Fisher MRN: [id number] Date of Birth: 1935/01/19   Medicare Important Message Given:  Yes    Consuelo Thayne Abena 04/17/2016, 1:14 PM

## 2016-04-17 NOTE — Progress Notes (Signed)
[place] TEAM 1 - Stepdown/ICU TEAM  [facility]  [id number] DOB: 29-Dec-1934 DOA: 04/08/2016 PCP: Haywood Pao, MD    Brief Narrative:  82 y.o. female with history of chronic diastolic CHF, cor pulmonale, Pulm HTN, COPD, chronic resp failure on 3-4L O2, HTN, chronic lymphedema, CAD, CKD Stage 3, [place] A. fib, chronic pain syndrome, COPD, frequent falls, hypothyroid and OSA who presented to ED from her SNF due to dyspnea and worsening edema from her legs to her abdomen despite being on lasix 80 and 40mg  daily.  She felt she was possibly 40lbs above her usual weight.  Subjective: The pt states she is feeling better overall, though she remains somewhat dyspneic.  She denies cp, n/v, or abdom pain.    Assessment & Plan:  Acute on chronic diastolic CHF with ANASARCA - Cor Pulmonale - Pulm HTN baseline weight ~75kg on 02/19/16  - EF 55-60% w/o WMA w/ severely dilated RV and RA and wide open TR regurgitation - attempts to diurese persist - net negative only ~3L thus far - cont to diurese and follow renal fxn   Filed Weights   04/15/16 0309 04/16/16 0440 04/17/16 0357  Weight: 92.3 kg (203 lb 7.8 oz) 91.4 kg (201 lb 8 oz) 92.7 kg (204 lb 5.9 oz)    COPD Well compensated at this time  Chronic pain syndrome No evidence of uncontrolled pain at this time  Lymphedema with chronic pain Unna boots changed per ortho techs   Anemia in chronic kidney disease Hgb is stable   CKD 3 Creatinine is slowly climbing - follow with ongoing diuresis  Recent Labs Lab 04/02/2016 1625 04/15/16 0320 04/16/16 0648 04/17/16 0236  CREATININE 1.13* 1.02* 1.10* 1.17*   Parox Afib CHADsvasc score>3 - not on anticoagulation due to falls - rate well controlled  DVT prophylaxis: Lovenox Code Status: DNR - NO CODE Family Communication: no family present at time of exam  Disposition Plan: SDU - cont PT/OT - will likely require SNF  Consultants:  [facility]   Procedures: 2/27 TTE    Antimicrobials:  None  Objective: Blood pressure 115/65, pulse 72, temperature 98 F (36.7 C), temperature source Oral, resp. rate 17, height 5\' 4"  (1.626 m), weight 92.7 kg (204 lb 5.9 oz), SpO2 100 %.  Intake/Output Summary (Last 24 hours) at 04/17/16 1610 Last data filed at 04/17/16 1147  Gross per 24 hour  Intake              243 ml  Output              475 ml  Net             -232 ml   Filed Weights   04/15/16 0309 04/16/16 0440 04/17/16 0357  Weight: 92.3 kg (203 lb 7.8 oz) 91.4 kg (201 lb 8 oz) 92.7 kg (204 lb 5.9 oz)    Examination: General: No acute respiratory distress Lungs: bibasilar crackles persist  Cardiovascular: distant HS - reg rate  Abdomen: Nontender, obese and distended, soft, bowel sounds positive Extremities: 2+ anasarca w/o signif change   CBC:  Recent Labs Lab 04/13/2016 1625 04/15/16 0320  WBC 4.7 4.9  HGB 9.3* 9.1*  HCT 30.4* 30.1*  MCV 105.2* 104.9*  PLT 114* 123XX123*   Basic Metabolic Panel:  Recent Labs Lab 03/26/2016 1625 04/15/16 0320 04/16/16 0648 04/17/16 0236  NA 139 139 138 136  K 4.0 4.0 4.1 4.5  CL 100* 101 101 100*  CO2 28 29  29 27  GLUCOSE 92 80 77 85  BUN 35* 34* 35* 34*  CREATININE 1.13* 1.02* 1.10* 1.17*  CALCIUM 9.9 9.8 9.6 9.5   GFR: Estimated Creatinine Clearance: 42.3 mL/min (by C-G formula based on SCr of 1.17 mg/dL (H)).  Liver Function Tests:  Recent Labs Lab 04/09/2016 1625  ALBUMIN 3.0*   Cardiac Enzymes:  Recent Labs Lab 04/15/2016 1625  TROPONINI <0.03    HbA1C: Hgb A1c MFr Bld  Date/Time Value Ref Range Status  03/12/2008 04:00 AM  4.6 - 6.1 % Final   6.1 (NOTE)   The ADA recommends the following therapeutic goal for glycemic   control related to Hgb A1C measurement:   Goal of Therapy:   < 7.0% Hgb A1C   Reference: American Diabetes Association: Clinical Practice   Recommendations 2008, Diabetes Care,  2008, 31:(Suppl 1).    Recent Results (from the past 240 hour(s))  MRSA PCR Screening      Status: None   Collection Time: 03/29/2016  8:53 PM  Result Value Ref Range Status   MRSA by PCR NEGATIVE NEGATIVE Final    Comment:        The GeneXpert MRSA Assay (FDA approved for NASAL specimens only), is one component of a comprehensive MRSA colonization surveillance program. It is not intended to diagnose MRSA infection nor to guide or monitor treatment for MRSA infections.      Scheduled Meds: . enoxaparin (LOVENOX) injection  40 mg Subcutaneous Q24H  . furosemide  80 mg Intravenous Q8H  . levothyroxine  125 mcg Oral QAC breakfast  . metoprolol succinate  50 mg Oral Daily  . polyethylene glycol  17 g Oral Daily  . potassium chloride SA  40 mEq Oral BID  . senna-docusate  2 tablet Oral BID  . sodium chloride flush  3 mL Intravenous Q12H     LOS: 3 days   Cherene Altes, MD [facility] Office  [phone] Pager - Text Page per Amion as per below:  On-Call/Text Page:      [email or web address]      password [email or web address]  If 7PM-7AM, please contact night-coverage [email or web address] Password [email or web address] 04/17/2016, 4:10 PM

## 2016-04-17 NOTE — Progress Notes (Addendum)
Progress Note  Patient Name: Victoria Fisher Date of Encounter: 04/17/2016  Primary Cardiologist: Dr. Sallyanne Kuster  Subjective   Feels a little better.  Still feels swollen and SHOB.  Stood with PT yesterday.    Inpatient Medications    Scheduled Meds: . enoxaparin (LOVENOX) injection  40 mg Subcutaneous Q24H  . furosemide  80 mg Intravenous Q8H  . levothyroxine  125 mcg Oral QAC breakfast  . metoprolol succinate  50 mg Oral Daily  . polyethylene glycol  17 g Oral Daily  . potassium chloride SA  40 mEq Oral BID  . senna-docusate  2 tablet Oral BID  . sodium chloride flush  3 mL Intravenous Q12H   Continuous Infusions:  PRN Meds: acetaminophen **OR** acetaminophen, bisacodyl, ipratropium-albuterol, lactulose, morphine, ondansetron **OR** ondansetron (ZOFRAN) IV   Vital Signs    Vitals:   04/16/16 2347 04/17/16 0337 04/17/16 0357 04/17/16 0816  BP: 104/69 102/66  99/68  Pulse: 68 67  (!) 59  Resp: 20 16  12   Temp: 98 F (36.7 C)   97.5 F (36.4 C)  TempSrc: Oral   Oral  SpO2: 99% 100%  100%  Weight:   204 lb 5.9 oz (92.7 kg)   Height:        Intake/Output Summary (Last 24 hours) at 04/17/16 0855 Last data filed at 04/17/16 0820  Gross per 24 hour  Intake              123 ml  Output             1025 ml  Net             -902 ml   Filed Weights   04/15/16 0309 04/16/16 0440 04/17/16 0357  Weight: 203 lb 7.8 oz (92.3 kg) 201 lb 8 oz (91.4 kg) 204 lb 5.9 oz (92.7 kg)    Telemetry    AFib, rate controlled - Personally Reviewed  ECG    AFib, low voltage - Personally Reviewed from a few days ago  Physical Exam   GEN: No acute distress.   Neck: No JVD ; distended veins on forehead Cardiac: irregularly irregular, no murmurs, rubs, or gallops.  Respiratory: Clear to auscultation bilaterally. GI: Soft, nontender, non-distended  MS: Bilateral arm edema- less prominent; No deformity. Legs wrapped Neuro:  Nonfocal  Psych: Normal affect   Labs     Chemistry  Recent Labs Lab 04/13/2016 1625 04/15/16 0320 04/16/16 0648 04/17/16 0236  NA 139 139 138 136  K 4.0 4.0 4.1 4.5  CL 100* 101 101 100*  CO2 28 29 29 27   GLUCOSE 92 80 77 85  BUN 35* 34* 35* 34*  CREATININE 1.13* 1.02* 1.10* 1.17*  CALCIUM 9.9 9.8 9.6 9.5  ALBUMIN 3.0*  --   --   --   GFRNONAA 45* 51* 46* 43*  GFRAA 52* 59* 53* 50*  ANIONGAP 11 9 8 9      Hematology  Recent Labs Lab 03/26/2016 1625 04/15/16 0320  WBC 4.7 4.9  RBC 2.89* 2.87*  HGB 9.3* 9.1*  HCT 30.4* 30.1*  MCV 105.2* 104.9*  MCH 32.2 31.7  MCHC 30.6 30.2  RDW 15.8* 15.8*  PLT 114* 126*    Cardiac Enzymes  Recent Labs Lab 03/24/2016 1625  TROPONINI <0.03     Recent Labs Lab 03/19/2016 1657  TROPIPOC 0.02     BNP  Recent Labs Lab 04/02/2016 1625  BNP 384.4*     DDimer No results for input(s): DDIMER  in the last 168 hours.   Radiology    No results found.  Cardiac Studies   Echo: no pericardial effusion; dilated right heart  Patient Profile     82 y.o. female with severe right heart failure, edema  Assessment & Plan    1) Acute on chronic systolic right heart failure: Chronic cor pulmonale-  Continue Lasix.  Dose was increased yesterday.  Renal function stable for now but needs to be watched closely.  May need PT consult.  She is still quite volume overloaded.    This will take time to improve.  Right heart structural problems predispose her to this scenario to repeat.  I suspect she will be here for several more days.  She may need f/u in CHF clinic to try to help reduce hospitalizations.  Encourage ambulation with PT.  She was receiving PT prior to coming to the hospital.  She will be here for several more days.   Signed, Larae Grooms, MD  04/17/2016, 8:55 AM

## 2016-04-17 NOTE — Progress Notes (Signed)
Patient ID: Victoria Fisher, female   DOB: [birth year], 82 y.o.   MRN: [id number] The the staff has notified me that patient is requesting something to help her sleep. She reports that trazodone 25 mg by mouth given last night work well for her for a while. She would like a higher dose. Her weight is 92.7 Kg. I will order trazodone 50 mg by mouth daily at bedtime as needed for insomnia.  Tennis Must, MD [phone].

## 2016-04-18 DIAGNOSIS — J9601 Acute respiratory failure with hypoxia: Secondary | ICD-10-CM

## 2016-04-18 DIAGNOSIS — R601 Generalized edema: Secondary | ICD-10-CM

## 2016-04-18 LAB — BASIC METABOLIC PANEL
ANION GAP: 9 (ref 5–15)
BUN: 34 mg/dL — ABNORMAL HIGH (ref 6–20)
CALCIUM: 9.7 mg/dL (ref 8.9–10.3)
CO2: 31 mmol/L (ref 22–32)
Chloride: 99 mmol/L — ABNORMAL LOW (ref 101–111)
Creatinine, Ser: 1.23 mg/dL — ABNORMAL HIGH (ref 0.44–1.00)
GFR, EST AFRICAN AMERICAN: 47 mL/min — AB (ref >60)
GFR, EST NON AFRICAN AMERICAN: 40 mL/min — AB (ref >60)
Glucose, Bld: 93 mg/dL (ref 65–99)
POTASSIUM: 4.8 mmol/L (ref 3.5–5.1)
SODIUM: 139 mmol/L (ref 135–145)

## 2016-04-18 LAB — CBC
HEMATOCRIT: 31.5 % — AB (ref 36.0–46.0)
HEMOGLOBIN: 9.3 g/dL — AB (ref 12.0–15.0)
MCH: 31.7 pg (ref 26.0–34.0)
MCHC: 29.5 g/dL — ABNORMAL LOW (ref 30.0–36.0)
MCV: 107.5 fL — ABNORMAL HIGH (ref 78.0–100.0)
Platelets: 115 10*3/uL — ABNORMAL LOW (ref 150–400)
RBC: 2.93 MIL/uL — AB (ref 3.87–5.11)
RDW: 16 % — ABNORMAL HIGH (ref 11.5–15.5)
WBC: 5.3 10*3/uL (ref 4.0–10.5)

## 2016-04-18 MED ORDER — ALPRAZOLAM 0.5 MG PO TABS
0.5000 mg | ORAL_TABLET | Freq: Two times a day (BID) | ORAL | Status: DC | PRN
  Administered 2016-04-18 – 2016-04-21 (×5): 0.5 mg via ORAL
  Filled 2016-04-18 (×5): qty 1

## 2016-04-18 MED ORDER — METOLAZONE 2.5 MG PO TABS: 2.5000 mg | ORAL_TABLET | Freq: Every day | ORAL | Status: DC

## 2016-04-18 MED ORDER — MORPHINE SULFATE 15 MG PO TABS
15.0000 mg | ORAL_TABLET | Freq: Once | ORAL | Status: AC
  Administered 2016-04-18: 15 mg via ORAL
  Filled 2016-04-18: qty 1

## 2016-04-18 NOTE — Progress Notes (Signed)
Progress Note  Patient Name: Victoria Fisher Date of Encounter: 04/18/2016  Primary Cardiologist: Dr. Sallyanne Kuster  Subjective   Feels a little better.  Feels that swelling and SOB have improved.   Inpatient Medications    Scheduled Meds: . enoxaparin (LOVENOX) injection  40 mg Subcutaneous Q24H  . furosemide  80 mg Intravenous Q8H  . levothyroxine  125 mcg Oral QAC breakfast  . metoprolol succinate  50 mg Oral Daily  . polyethylene glycol  17 g Oral Daily  . potassium chloride SA  40 mEq Oral BID  . senna-docusate  2 tablet Oral BID  . sodium chloride flush  3 mL Intravenous Q12H   Continuous Infusions:  PRN Meds: acetaminophen **OR** acetaminophen, bisacodyl, ipratropium-albuterol, lactulose, morphine, ondansetron **OR** ondansetron (ZOFRAN) IV, traZODone   Vital Signs    Vitals:   04/18/16 0324 04/18/16 0500 04/18/16 0808 04/18/16 0926  BP: 102/61  107/68 107/86  Pulse: 75  79 78  Resp: 11  13   Temp: 97.6 F (36.4 C)  97.7 F (36.5 C)   TempSrc: Oral  Oral   SpO2: 100%  100%   Weight:  202 lb 6.1 oz (91.8 kg)    Height:        Intake/Output Summary (Last 24 hours) at 04/18/16 1143 Last data filed at 04/18/16 0933  Gross per 24 hour  Intake              483 ml  Output              550 ml  Net              -67 ml   Filed Weights   04/16/16 0440 04/17/16 0357 04/18/16 0500  Weight: 201 lb 8 oz (91.4 kg) 204 lb 5.9 oz (92.7 kg) 202 lb 6.1 oz (91.8 kg)    Telemetry    AFib, rate controlled - Personally Reviewed  ECG    AFib, low voltage - Personally Reviewed from a few days ago  Physical Exam   GEN: No acute distress.   Neck: No JVD ; distended veins on forehead Cardiac: irregularly irregular, no murmurs, rubs, or gallops.  Respiratory: Clear to auscultation bilaterally. GI: Soft, nontender, non-distended  MS: Bilateral arm edema- less prominent; No deformity. Legs wrapped Neuro:  Nonfocal  Psych: Normal affect   Labs    Chemistry  Recent  Labs Lab 03/24/2016 1625  04/16/16 0648 04/17/16 0236 04/18/16 0242  NA 139  < > 138 136 139  K 4.0  < > 4.1 4.5 4.8  CL 100*  < > 101 100* 99*  CO2 28  < > 29 27 31   GLUCOSE 92  < > 77 85 93  BUN 35*  < > 35* 34* 34*  CREATININE 1.13*  < > 1.10* 1.17* 1.23*  CALCIUM 9.9  < > 9.6 9.5 9.7  ALBUMIN 3.0*  --   --   --   --   GFRNONAA 45*  < > 46* 43* 40*  GFRAA 52*  < > 53* 50* 47*  ANIONGAP 11  < > 8 9 9   < > = values in this interval not displayed.   Hematology  Recent Labs Lab 03/25/2016 1625 04/15/16 0320 04/18/16 0242  WBC 4.7 4.9 5.3  RBC 2.89* 2.87* 2.93*  HGB 9.3* 9.1* 9.3*  HCT 30.4* 30.1* 31.5*  MCV 105.2* 104.9* 107.5*  MCH 32.2 31.7 31.7  MCHC 30.6 30.2 29.5*  RDW 15.8* 15.8* 16.0*  PLT 114* 126* 115*    Cardiac Enzymes  Recent Labs Lab 04/13/2016 1625  TROPONINI <0.03     Recent Labs Lab 04/06/2016 1657  TROPIPOC 0.02     BNP  Recent Labs Lab 03/24/2016 1625  BNP 384.4*     DDimer No results for input(s): DDIMER in the last 168 hours.   Radiology    No results found.  Cardiac Studies   Echo: no pericardial effusion; dilated right heart  Patient Profile     82 y.o. female with severe right heart failure, edema  Assessment & Plan    1) Acute on chronic systolic right heart failure: Chronic cor pulmonale-  Continue Lasix. Renal function stable for now but needs to be watched closely.  May need PT consult.  She is still quite volume overloaded.  If diuresis does not improve, can increase lasix to 120 mg BID instead of 80 q8.  This Esa Raden take time to improve.  Right heart structural problems predispose her to this scenario to repeat.  She Cyanna Neace need follow up in HF clinic at discharge.  Encourage ambulation with PT.  She was receiving PT prior to coming to the hospital.  She Nael Petrosyan be here for several more days.   2. Atrial fibrillation: rate well controlled. Per prior notes unable to take anticoagulation.  CHADS2VASc 4 Signed, Lino Wickliff Meredith Leeds, MD  04/18/2016, 11:43 AM

## 2016-04-18 NOTE — Progress Notes (Signed)
[place] TEAM 1 - Stepdown/ICU TEAM  MAGDELYN BELTRAMI  [id number] DOB: Jun 09, 1934 DOA: 03/21/2016 PCP: Haywood Pao, MD    Brief Narrative:  82 y.o. female with history of chronic diastolic CHF, cor pulmonale, Pulm HTN, COPD, chronic resp failure on 3-4L O2, HTN, chronic lymphedema, CAD, CKD Stage 3, Parox A. fib, chronic pain syndrome, COPD, frequent falls, hypothyroid and OSA who presented to ED from her SNF due to dyspnea and worsening edema from her legs to her abdomen despite being on lasix 80 and 40mg  daily.  She felt she was possibly 40lbs above her usual weight.  Subjective: The patient feels weak today.  She reports a poor appetite.  She denies chest pain.  She does admit to some shortness of breath especially with minimal exertion.    Assessment & Plan:  Acute on chronic diastolic CHF with ANASARCA - Cor Pulmonale - Pulm HTN baseline weight ~75kg on 02/19/16  - EF 55-60% w/o WMA w/ severely dilated RV and RA and wide open TR regurgitation - attempts to diurese persist - net negative only ~3.4L thus far - cont to diurese and follow renal fxn - making very slow progress at this time   [facility] Weights   04/16/16 0440 04/17/16 0357 04/18/16 0500  Weight: 91.4 kg (201 lb 8 oz) 92.7 kg (204 lb 5.9 oz) 91.8 kg (202 lb 6.1 oz)    COPD Well compensated  Chronic pain syndrome No evidence of uncontrolled pain at this time  Lymphedema with chronic pain Unna boots changed per ortho tech  Anemia in chronic kidney disease Hgb is stable   CKD 3 Creatinine is slowly climbing - follow with ongoing diuresis - may become a limiting factor in tx soon   Recent Labs Lab 03/29/2016 1625 04/15/16 0320 04/16/16 0648 04/17/16 0236 04/18/16 0242  CREATININE 1.13* 1.02* 1.10* 1.17* 1.23*   Parox Afib CHADsvasc score>3 - not on anticoagulation due to falls - rate well controlled  DVT prophylaxis: Lovenox Code Status: DNR - NO CODE Family Communication: no family present at time  of exam  Disposition Plan: stable for transfer to CHF floor - cont PT/OT - will likely require SNF  Consultants:  [facility]   Procedures: 2/27 TTE   Antimicrobials:  None  Objective: Blood pressure 109/67, pulse 67, temperature 97.7 F (36.5 C), temperature source Oral, resp. rate 12, height 5\' 4"  (1.626 m), weight 91.8 kg (202 lb 6.1 oz), SpO2 100 %.  Intake/Output Summary (Last 24 hours) at 04/18/16 1523 Last data filed at 04/18/16 1400  Gross per 24 hour  Intake              483 ml  Output              850 ml  Net             -367 ml   Filed Weights   04/16/16 0440 04/17/16 0357 04/18/16 0500  Weight: 91.4 kg (201 lb 8 oz) 92.7 kg (204 lb 5.9 oz) 91.8 kg (202 lb 6.1 oz)    Examination: General: No acute respiratory distress at rest in chair  Lungs: bibasilar crackles persist - no wheezing  Cardiovascular: distant HS - reg rate  Abdomen: Nontender, obese and distended, soft, bowel sounds positive Extremities: 2+ anasarca w/o signif change still   CBC:  Recent Labs Lab 04/05/2016 1625 04/15/16 0320 04/18/16 0242  WBC 4.7 4.9 5.3  HGB 9.3* 9.1* 9.3*  HCT 30.4* 30.1* 31.5*  MCV 105.2*  104.9* 107.5*  PLT 114* 126* [id number]*   Basic Metabolic Panel:  Recent Labs Lab 04/04/2016 1625 04/15/16 0320 04/16/16 0648 04/17/16 0236 04/18/16 0242  NA 139 139 138 136 139  K 4.0 4.0 4.1 4.5 4.8  CL 100* 101 101 100* 99*  CO2 28 29 29 27 31   GLUCOSE 92 80 77 85 93  BUN 35* 34* 35* 34* 34*  CREATININE 1.13* 1.02* 1.10* 1.17* 1.23*  CALCIUM 9.9 9.8 9.6 9.5 9.7   GFR: Estimated Creatinine Clearance: 40 mL/min (by C-G formula based on SCr of 1.23 mg/dL (H)).  Liver Function Tests:  Recent Labs Lab 03/21/2016 1625  ALBUMIN 3.0*   Cardiac Enzymes:  Recent Labs Lab 04/11/2016 1625  TROPONINI <0.03    HbA1C: Hgb A1c MFr Bld  Date/Time Value Ref Range Status  03/12/2008 04:00 AM  4.6 - 6.1 % Final   6.1 (NOTE)   The ADA recommends the following therapeutic  goal for glycemic   control related to Hgb A1C measurement:   Goal of Therapy:   < 7.0% Hgb A1C   Reference: American Diabetes Association: Clinical Practice   Recommendations 2008, Diabetes Care,  2008, 31:(Suppl 1).    Recent Results (from the past 240 hour(s))  MRSA PCR Screening     Status: None   Collection Time: 04/03/2016  8:53 PM  Result Value Ref Range Status   MRSA by PCR NEGATIVE NEGATIVE Final    Comment:        The GeneXpert MRSA Assay (FDA approved for NASAL specimens only), is one component of a comprehensive MRSA colonization surveillance program. It is not intended to diagnose MRSA infection nor to guide or monitor treatment for MRSA infections.      Scheduled Meds: . enoxaparin (LOVENOX) injection  40 mg Subcutaneous Q24H  . furosemide  80 mg Intravenous Q8H  . levothyroxine  125 mcg Oral QAC breakfast  . metoprolol succinate  50 mg Oral Daily  . polyethylene glycol  17 g Oral Daily  . potassium chloride SA  40 mEq Oral BID  . senna-docusate  2 tablet Oral BID  . sodium chloride flush  3 mL Intravenous Q12H     LOS: 4 days   Cherene Altes, MD [facility] Office  [phone] Pager - Text Page per Amion as per below:  On-Call/Text Page:      [email or web address]      password [email or web address]  If 7PM-7AM, please contact night-coverage [email or web address] Password [facility] 04/18/2016, 3:23 PM

## 2016-04-18 NOTE — Progress Notes (Signed)
   04/18/16 2124  Vitals  Temp 97.6 F (36.4 C)  Temp Source Oral  BP 106/75  BP Location Right Arm  BP Method Automatic  Patient Position (if appropriate) Lying  Pulse Rate 78  Pulse Rate Source Dinamap  Resp 20  Oxygen Therapy  SpO2 100 %  O2 Device Nasal Cannula  O2 Flow Rate (L/min) 2.5 L/min  Height and Weight  Height 5\' 4"  (1.626 m)  Weight 91.9 kg (202 lb 9.6 oz) (bedscale)  Type of Scale Used Bed  BSA (Calculated - sq m) 2.04 sq meters  BMI (Calculated) 34.8  Weight in (lb) to have BMI = 25 145.3  Admitted pt to rm 3E07 from 4East, pt alert and oriented, call bell placed within reach, oriented to room, placed on cardiac monitor, CCMD made aware.

## 2016-04-19 LAB — BASIC METABOLIC PANEL
Anion gap: 5 (ref 5–15)
BUN: 35 mg/dL — AB (ref 6–20)
CHLORIDE: 100 mmol/L — AB (ref 101–111)
CO2: 33 mmol/L — ABNORMAL HIGH (ref 22–32)
Calcium: 9.8 mg/dL (ref 8.9–10.3)
Creatinine, Ser: 1.17 mg/dL — ABNORMAL HIGH (ref 0.44–1.00)
GFR calc Af Amer: 50 mL/min — ABNORMAL LOW (ref >60)
GFR calc non Af Amer: 43 mL/min — ABNORMAL LOW (ref >60)
Glucose, Bld: 86 mg/dL (ref 65–99)
POTASSIUM: 5 mmol/L (ref 3.5–5.1)
SODIUM: 138 mmol/L (ref 135–145)

## 2016-04-19 MED ORDER — ACETAMINOPHEN 500 MG PO TABS
500.0000 mg | ORAL_TABLET | Freq: Four times a day (QID) | ORAL | Status: DC | PRN
  Administered 2016-04-19 – 2016-04-21 (×4): 500 mg via ORAL
  Filled 2016-04-19 (×4): qty 1

## 2016-04-19 MED ORDER — METOLAZONE 5 MG PO TABS
5.0000 mg | ORAL_TABLET | Freq: Every day | ORAL | Status: DC
  Administered 2016-04-19 – 2016-04-21 (×3): 5 mg via ORAL
  Filled 2016-04-19 (×3): qty 1

## 2016-04-19 NOTE — Progress Notes (Signed)
[place] TEAM 1 - Stepdown/ICU TEAM  Victoria Fisher  [id number] DOB: [birth year] DOA: 04/07/2016 PCP: Haywood Pao, MD    Brief Narrative:   82 y.o. female with history of chronic diastolic CHF, cor pulmonale, Pulm HTN, COPD, chronic resp failure on 3-4L O2, HTN, chronic lymphedema, CAD, CKD Stage 3, Parox A. fib, chronic pain syndrome, COPD, frequent falls, hypothyroid and OSA who presented to ED from her SNF due to dyspnea and worsening edema from her legs to her abdomen despite being on lasix 80 and 40mg  daily.  She felt she was possibly 40lbs above her usual weight.  Subjective:  The patient feels weak today.  Still has mild SOB.  She denies chest pain.  No abd pain or focal weakness.    Assessment & Plan:  Acute on chronic diastolic CHF with ANASARCA - Cor Pulmonale - Pulm HTN  baseline weight ~75kg on 02/19/16  - EF 55-60% w/o WMA w/ severely dilated RV and RA and wide open TR regurgitation - cont to diurese and follow renal fxn, UNNA boots - making very slow progress at this time , she is incontinent hence I&Os are incorrect, monitor weight. Cards following.  Filed Weights   04/18/16 0500 04/18/16 2124 04/19/16 0639  Weight: 91.8 kg (202 lb 6.1 oz) 91.9 kg (202 lb 9.6 oz) 93.2 kg (205 lb 7.5 oz)    COPD Well compensated  Chronic pain syndrome No evidence of uncontrolled pain at this time  Lymphedema with chronic pain Unna boots changed per ortho tech  Anemia in chronic kidney disease Hgb is stable   CKD 3 Creatinine stable - follow with ongoing diuresis - may become a limiting factor in tx soon   Recent Labs Lab 04/15/16 0320 04/16/16 0648 04/17/16 0236 04/18/16 0242 04/19/16 0355  CREATININE 1.02* 1.10* 1.17* 1.23* 1.17*   Parox Afib CHADsvasc score>3 - not on anticoagulation due to falls - rate well controlled    DVT prophylaxis: Lovenox Code Status: DNR - NO CODE Family Communication: no family present at time of exam  Disposition Plan:  stable for transfer to CHF floor - cont PT/OT - will likely require SNF  Consultants:  [facility]   Procedures: 2/27 TTE - - Left ventricle: The cavity size was normal. Wall thickness was normal. Systolic function was normal. The estimated ejection fraction was in the range of 55% to 60%. Wall motion was normal; there were no regional wall motion abnormalities. - Ventricular septum: The contour showed diastolic flattening. These changes are consistent with RV volume overload. - Mitral valve: There was mild to moderate regurgitation. - Left atrium: The atrium was moderately to severely dilated. - Right ventricle: The cavity size was severely dilated. Systolic function was mildly to moderately reduced. - Right atrium: The atrium was massively dilated. - Tricuspid valve: There was malcoaptation of the valve leaflets. There was wide-open regurgitation directed centrally. A diagnosis of severe regurgitation is supported by hepatic vein systolic flow reversal. - Pulmonary arteries: Systolic pressure was mildly increased. PA peak pressure: 42 mm Hg (S). - Pericardium, extracardiac: There was a left pleural effusion   Antimicrobials:  None  Objective: Blood pressure 116/89, pulse 74, temperature 97.7 F (36.5 C), temperature source Oral, resp. rate 18, height 5\' 4"  (1.626 m), weight 93.2 kg (205 lb 7.5 oz), SpO2 100 %.  Intake/Output Summary (Last 24 hours) at 04/19/16 1127 Last data filed at 04/19/16 0938  Gross per 24 hour  Intake  840 ml  Output             1900 ml  Net            -1060 ml   Filed Weights   04/18/16 0500 04/18/16 2124 04/19/16 0639  Weight: 91.8 kg (202 lb 6.1 oz) 91.9 kg (202 lb 9.6 oz) 93.2 kg (205 lb 7.5 oz)    Examination: General: No acute respiratory distress at rest in chair  Lungs: bibasilar crackles persist - no wheezing  Cardiovascular: distant HS - reg rate  Abdomen: Nontender, obese and distended, soft, bowel sounds  positive Extremities: 2+ anasarca w/o signif change, +ve UNNA boots  CBC:  Recent Labs Lab 03/25/2016 1625 04/15/16 0320 04/18/16 0242  WBC 4.7 4.9 5.3  HGB 9.3* 9.1* 9.3*  HCT 30.4* 30.1* 31.5*  MCV 105.2* 104.9* 107.5*  PLT 114* 126* [id number]*   Basic Metabolic Panel:  Recent Labs Lab 04/15/16 0320 04/16/16 0648 04/17/16 0236 04/18/16 0242 04/19/16 0355  NA 139 138 136 139 138  K 4.0 4.1 4.5 4.8 5.0  CL 101 101 100* 99* 100*  CO2 29 29 27 31  33*  GLUCOSE 80 77 85 93 86  BUN 34* 35* 34* 34* 35*  CREATININE 1.02* 1.10* 1.17* 1.23* 1.17*  CALCIUM 9.8 9.6 9.5 9.7 9.8   GFR: Estimated Creatinine Clearance: 42.4 mL/min (by C-G formula based on SCr of 1.17 mg/dL (H)).  Liver Function Tests:  Recent Labs Lab 04/11/2016 1625  ALBUMIN 3.0*   Cardiac Enzymes:  Recent Labs Lab 03/29/2016 1625  TROPONINI <0.03    HbA1C: Hgb A1c MFr Bld  Date/Time Value Ref Range Status  03/12/2008 04:00 AM  4.6 - 6.1 % Final   6.1 (NOTE)   The ADA recommends the following therapeutic goal for glycemic   control related to Hgb A1C measurement:   Goal of Therapy:   < 7.0% Hgb A1C   Reference: American Diabetes Association: Clinical Practice   Recommendations 2008, Diabetes Care,  2008, 31:(Suppl 1).    Recent Results (from the past 240 hour(s))  MRSA PCR Screening     Status: None   Collection Time: 04/13/2016  8:53 PM  Result Value Ref Range Status   MRSA by PCR NEGATIVE NEGATIVE Final    Comment:        The GeneXpert MRSA Assay (FDA approved for NASAL specimens only), is one component of a comprehensive MRSA colonization surveillance program. It is not intended to diagnose MRSA infection nor to guide or monitor treatment for MRSA infections.      Scheduled Meds: . enoxaparin (LOVENOX) injection  40 mg Subcutaneous Q24H  . furosemide  80 mg Intravenous Q8H  . levothyroxine  125 mcg Oral QAC breakfast  . metolazone  5 mg Oral Daily  . metoprolol succinate  50 mg Oral Daily   . polyethylene glycol  17 g Oral Daily  . senna-docusate  2 tablet Oral BID  . sodium chloride flush  3 mL Intravenous Q12H     LOS: 5 days   Signature  Lala Lund K M.D on 04/19/2016 at 11:27 AM  Between 7am to 7pm - Pager - [phone], After 7pm go to [email or web address] - password [facility]  [facility]  - Office  [phone]

## 2016-04-19 NOTE — Progress Notes (Signed)
Progress Note  Patient Name: Victoria Fisher Date of Encounter: 04/19/2016  Primary Cardiologist: Dr. Sallyanne Kuster  Subjective   Feels a little better.  More SOB today and feels that her legs are more swollen.  Inpatient Medications    Scheduled Meds: . enoxaparin (LOVENOX) injection  40 mg Subcutaneous Q24H  . furosemide  80 mg Intravenous Q8H  . levothyroxine  125 mcg Oral QAC breakfast  . metolazone  5 mg Oral Daily  . metoprolol succinate  50 mg Oral Daily  . polyethylene glycol  17 g Oral Daily  . senna-docusate  2 tablet Oral BID  . sodium chloride flush  3 mL Intravenous Q12H   Continuous Infusions:  PRN Meds: ALPRAZolam, bisacodyl, ipratropium-albuterol, lactulose, morphine, ondansetron **OR** ondansetron (ZOFRAN) IV, traZODone   Vital Signs    Vitals:   04/19/16 0202 04/19/16 0639 04/19/16 1040 04/19/16 1207  BP:  (!) 118/57 116/89 130/88  Pulse:  74  79  Resp:  18  18  Temp:  97.7 F (36.5 C)  97.4 F (36.3 C)  TempSrc:  Oral  Axillary  SpO2: 100% 100% 100% 100%  Weight:  205 lb 7.5 oz (93.2 kg)    Height:        Intake/Output Summary (Last 24 hours) at 04/19/16 1249 Last data filed at 04/19/16 [id number]  Gross per 24 hour  Intake              840 ml  Output             1600 ml  Net             -760 ml   Filed Weights   04/18/16 0500 04/18/16 2124 04/19/16 0639  Weight: 202 lb 6.1 oz (91.8 kg) 202 lb 9.6 oz (91.9 kg) 205 lb 7.5 oz (93.2 kg)    Telemetry    AFib, rate controlled - Personally Reviewed  ECG    AFib, low voltage - Personally Reviewed from a few days ago  Physical Exam   GEN: No acute distress.   Neck: 10 cm JVD ; distended veins on forehead Cardiac: irregularly irregular, no murmurs, rubs, or gallops.  Respiratory: Clear to auscultation bilaterally. GI: Soft, nontender, non-distended  MS: Bilateral arm edema- less prominent; No deformity. Legs wrapped Neuro:  Nonfocal  Psych: Normal affect   Labs    Chemistry  Recent  Labs Lab 04/11/2016 1625  04/17/16 0236 04/18/16 0242 04/19/16 0355  NA 139  < > 136 139 138  K 4.0  < > 4.5 4.8 5.0  CL 100*  < > 100* 99* 100*  CO2 28  < > 27 31 33*  GLUCOSE 92  < > 85 93 86  BUN 35*  < > 34* 34* 35*  CREATININE 1.13*  < > 1.17* 1.23* 1.17*  CALCIUM 9.9  < > 9.5 9.7 9.8  ALBUMIN 3.0*  --   --   --   --   GFRNONAA 45*  < > 43* 40* 43*  GFRAA 52*  < > 50* 47* 50*  ANIONGAP 11  < > 9 9 5   < > = values in this interval not displayed.   Hematology  Recent Labs Lab 03/20/2016 1625 04/15/16 0320 04/18/16 0242  WBC 4.7 4.9 5.3  RBC 2.89* 2.87* 2.93*  HGB 9.3* 9.1* 9.3*  HCT 30.4* 30.1* 31.5*  MCV 105.2* 104.9* 107.5*  MCH 32.2 31.7 31.7  MCHC 30.6 30.2 29.5*  RDW 15.8* 15.8* 16.0*  PLT  114* 126* 115*    Cardiac Enzymes  Recent Labs Lab 03/19/2016 1625  TROPONINI <0.03     Recent Labs Lab 03/30/2016 1657  TROPIPOC 0.02     BNP  Recent Labs Lab 04/10/2016 1625  BNP 384.4*     DDimer No results for input(s): DDIMER in the last 168 hours.   Radiology    No results found.  Cardiac Studies   Echo: no pericardial effusion; dilated right heart  Patient Profile     82 y.o. female with severe right heart failure, edema  Assessment & Plan    1) Acute on chronic systolic right heart failure: Chronic cor pulmonale-  Continue Lasix. Renal function unchanged.  May need PT consult.  She is still quite volume overloaded.  If diuresis does not improve, can increase lasix to 120 mg BID instead of 80 q8. Does not have foley in place and incontinent. Does remain volume overloaded on exam.  This Javaya [place] take time to improve.  Right heart structural problems predispose her to this scenario to repeat.  She Rui Wordell need follow up in HF clinic at discharge.  Encourage ambulation with PT.  She was receiving PT prior to coming to the hospital.  She Taiquan Campanaro be here for several more days.   2. Atrial fibrillation: rate well controlled. Per prior notes unable to take  anticoagulation.  CHADS2VASc 4  Signed, Kaymarie Wynn Meredith Leeds, MD  04/19/2016, 12:49 PM

## 2016-04-20 ENCOUNTER — Inpatient Hospital Stay (HOSPITAL_COMMUNITY): Payer: Medicare Other

## 2016-04-20 ENCOUNTER — Other Ambulatory Visit: Payer: Self-pay | Admitting: Licensed Clinical Social Worker

## 2016-04-20 DIAGNOSIS — R601 Generalized edema: Secondary | ICD-10-CM

## 2016-04-20 LAB — BASIC METABOLIC PANEL
ANION GAP: 8 (ref 5–15)
BUN: 35 mg/dL — ABNORMAL HIGH (ref 6–20)
CALCIUM: 9.8 mg/dL (ref 8.9–10.3)
CO2: 34 mmol/L — AB (ref 22–32)
Chloride: 95 mmol/L — ABNORMAL LOW (ref 101–111)
Creatinine, Ser: 1.2 mg/dL — ABNORMAL HIGH (ref 0.44–1.00)
GFR calc Af Amer: 48 mL/min — ABNORMAL LOW (ref >60)
GFR, EST NON AFRICAN AMERICAN: 42 mL/min — AB (ref >60)
Glucose, Bld: 105 mg/dL — ABNORMAL HIGH (ref 65–99)
Potassium: 3.9 mmol/L (ref 3.5–5.1)
Sodium: 137 mmol/L (ref 135–145)

## 2016-04-20 LAB — HEPATIC FUNCTION PANEL
ALBUMIN: 3.3 g/dL — AB (ref 3.5–5.0)
ALT: 6 U/L — ABNORMAL LOW (ref 14–54)
AST: 22 U/L (ref 15–41)
Alkaline Phosphatase: 95 U/L (ref 38–126)
Bilirubin, Direct: 0.3 mg/dL (ref 0.1–0.5)
Indirect Bilirubin: 0.6 mg/dL (ref 0.3–0.9)
Total Bilirubin: 0.9 mg/dL (ref 0.3–1.2)
Total Protein: 7.3 g/dL (ref 6.5–8.1)

## 2016-04-20 LAB — MAGNESIUM: Magnesium: 2.2 mg/dL (ref 1.7–2.4)

## 2016-04-20 MED ORDER — ORAL CARE MOUTH RINSE
15.0000 mL | Freq: Two times a day (BID) | OROMUCOSAL | Status: DC
  Administered 2016-04-21: 15 mL via OROMUCOSAL

## 2016-04-20 MED ORDER — CHLORHEXIDINE GLUCONATE 0.12 % MT SOLN
15.0000 mL | Freq: Two times a day (BID) | OROMUCOSAL | Status: DC
  Administered 2016-04-21: 15 mL via OROMUCOSAL
  Filled 2016-04-20 (×3): qty 15

## 2016-04-20 MED ORDER — FUROSEMIDE 10 MG/ML IJ SOLN
120.0000 mg | Freq: Two times a day (BID) | INTRAVENOUS | Status: DC
  Administered 2016-04-20 – 2016-04-21 (×3): 120 mg via INTRAVENOUS
  Filled 2016-04-20 (×5): qty 12

## 2016-04-20 MED ORDER — METHYLPREDNISOLONE SODIUM SUCC 125 MG IJ SOLR: 125.0000 mg | Freq: Once | INTRAMUSCULAR | Status: AC

## 2016-04-20 MED ORDER — METHYLPREDNISOLONE SODIUM SUCC 125 MG IJ SOLR
INTRAMUSCULAR | Status: AC
  Administered 2016-04-20: 125 mg
  Filled 2016-04-20: qty 2

## 2016-04-20 MED ORDER — ALBUTEROL SULFATE (2.5 MG/3ML) 0.083% IN NEBU: 2.5000 mg | INHALATION_SOLUTION | RESPIRATORY_TRACT | Status: DC | PRN

## 2016-04-20 NOTE — Evaluation (Addendum)
Occupational Therapy Evaluation Patient Details Name: Victoria Fisher MRN: [id number] DOB: 07-Feb-1935 Today's Date: 04/20/2016    History of Present Illness Pt is an 82 y/o female admitted from SNF ([facility]) secondary to dyspnea and edema. PMH including but not limited to a-fib, CHF, COPD, CAD, and HTN.   Clinical Impression   PTA, pt was living at [facility]) and was receiving A for ADLs (i.e. Dressing and bathing).  Currently, pt requires Mod A for ADLs with Max A for LB ADLs. Pt strand pivot transfer to recliner with RW and Mod A and +2 for safety due to increase anxiety with functional mobility. SpO2 on 3.5L at rest 99; after transfer 85 then returned to 90's. Pt would benefits from acute OT to increase occupational participation and performance. Recommend dc to SNF for further OT to increase independence and safety.    Follow Up Recommendations  SNF;Supervision/Assistance - 24 hour    Equipment Recommendations  Other (comment) (Defer to next venue)    Recommendations for Other Services       Precautions / Restrictions Precautions Precautions: Fall Restrictions Weight Bearing Restrictions: No      Mobility Bed Mobility Overal bed mobility: Needs Assistance Bed Mobility: Rolling;Supine to Sit;Sit to Supine Rolling: Mod assist;+2 for safety/equipment   Supine to sit: Max assist;+2 for physical assistance;HOB elevated Sit to supine: Max assist;+2 for physical assistance;+2 for safety/equipment   General bed mobility comments: Pt requires VCs for hand placement   Transfers Overall transfer level: Needs assistance Equipment used: Rolling walker (2 wheeled) Transfers: Sit to/from Stand Sit to Stand: Mod assist;+2 safety/equipment         General transfer comment: VCs for RW management    Balance Overall balance assessment: Needs assistance Sitting-balance support: Feet supported;Bilateral upper extremity supported Sitting balance-Leahy Scale: Fair      Standing balance support: During functional activity;Bilateral upper extremity supported Standing balance-Leahy Scale: Poor Standing balance comment: pt reliant on bilateral UEs on RW                            ADL Overall ADL's : Needs assistance/impaired Eating/Feeding: Set up;Sitting   Grooming: Minimal assistance;Sitting   Upper Body Bathing: Sitting;Moderate assistance   Lower Body Bathing: Maximal assistance;Sit to/from stand   Upper Body Dressing : Moderate assistance;Sitting   Lower Body Dressing: Maximal assistance;Sit to/from stand (Unable to don socks )   Toilet Transfer: Moderate assistance;+2 for safety/equipment;Stand-pivot;BSC;RW Toilet Transfer Details (indicate cue type and reason): Pt has incontenence and has been using bedpan Toileting- Clothing Manipulation and Hygiene: Maximal assistance       Functional mobility during ADLs: Moderate assistance;+2 for safety/equipment;Rolling walker General ADL Comments: Pt has difficulty with ADLs mainly dressing and bathing due to edema, weakness, and anxiety     Vision Baseline Vision/History: Wears glasses ("To hold my hair up with") Patient Visual Report: No change from baseline       Perception     Praxis      Pertinent Vitals/Pain Pain Assessment: Faces Faces Pain Scale: Hurts little more Pain Location: upper back Pain Descriptors / Indicators: Sore Pain Intervention(s): Monitored during session     Hand Dominance Right   Extremity/Trunk Assessment Upper Extremity Assessment Upper Extremity Assessment: Generalized weakness   Lower Extremity Assessment Lower Extremity Assessment: Generalized weakness       Communication Communication Communication: No difficulties   Cognition Arousal/Alertness: Awake/alert Behavior During Therapy: Anxious Overall Cognitive Status: Within  Functional Limits for tasks assessed                     General Comments       Exercises        Shoulder Instructions      Home Living Family/patient expects to be discharged to:: Skilled nursing facility                   Bathroom Shower/Tub: Walk-in shower         Home Equipment: Shower seat   Additional Comments: Pt gets A when bathing      Prior Functioning/Environment Level of Independence: Needs assistance    ADL's / Homemaking Assistance Needed: Pt reports that she would get A with dressing and bathing   Comments: Pt seemed confused and had difficulty remember PLOF        OT Problem List: Decreased strength;Decreased activity tolerance;Decreased range of motion;Impaired balance (sitting and/or standing);Decreased safety awareness;Decreased knowledge of use of DME or AE;Pain;Increased edema      OT Treatment/Interventions: Self-care/ADL training;Therapeutic exercise;Energy conservation;DME and/or AE instruction;Therapeutic activities;Patient/family education    OT Goals(Current goals can be found in the care plan section) Acute Rehab OT Goals Patient Stated Goal: return to [facility] OT Goal Formulation: With patient Time For Goal Achievement: 05/04/16 Potential to Achieve Goals: Good ADL Goals Pt Will Perform Grooming: with modified independence;sitting Pt Will Perform Lower Body Bathing: with min guard assist;with adaptive equipment;sitting/lateral leans Pt Will Perform Lower Body Dressing: with min guard assist;with adaptive equipment;sitting/lateral leans Pt Will Transfer to Toilet: with min guard assist;stand pivot transfer;bedside commode  OT Frequency: Min 2X/week   Barriers to D/C:            Co-evaluation              End of Session Equipment Utilized During Treatment: Gait belt;Rolling walker;Oxygen Nurse Communication: Mobility status  Activity Tolerance: Patient tolerated treatment well Patient left: in bed;with call bell/phone within reach;with nursing/sitter in room  OT Visit Diagnosis: Muscle weakness (generalized)  (M62.81)                ADL either performed or assessed with clinical judgement  Time: 1050-1142 OT Time Calculation (min): 52 min Charges:  OT General Charges $OT Visit: 1 Procedure OT Evaluation $OT Eval Moderate Complexity: 1 Procedure OT Treatments $Self Care/Home Management : 8-22 mins G-Codes:     [employer], OTR/L [place] 04/20/2016, 11:57 AM

## 2016-04-20 NOTE — Progress Notes (Signed)
Progress Note  Patient Name: Victoria Fisher Date of Encounter: 04/20/2016  Primary Cardiologist: Dr. Sallyanne Kuster  Subjective   82 y.o. female with medical history significant for chronic diastolic CHF, cor pulmonale, Pulm HTN, COPD, chronic resp failure on 3-4L O2, HTN, chronic lymphedema, CAD, chronic kidney disease 3, P A. fib, chronic pain syndrome, COPD, frequent falls, hypothyroid and OSA presented to ED from SNF due to worsening edema, extending from her legs to her abdomen, including breasts, despite being on lasix 80 and 40mg  daily   has been transferred to 4N. I/O are -4 liters so far this admission    Inpatient Medications    Scheduled Meds: . enoxaparin (LOVENOX) injection  40 mg Subcutaneous Q24H  . furosemide  120 mg Intravenous BID  . levothyroxine  125 mcg Oral QAC breakfast  . metolazone  5 mg Oral Daily  . metoprolol succinate  50 mg Oral Daily  . polyethylene glycol  17 g Oral Daily  . senna-docusate  2 tablet Oral BID  . sodium chloride flush  3 mL Intravenous Q12H   Continuous Infusions:  PRN Meds: acetaminophen, albuterol, ALPRAZolam, bisacodyl, ipratropium-albuterol, lactulose, morphine, ondansetron **OR** ondansetron (ZOFRAN) IV, traZODone   Vital Signs    Vitals:   04/20/16 1345 04/20/16 1400 04/20/16 1415 04/20/16 1430  BP: (!) 142/75 136/87 130/83 130/84  Pulse: (!) 102 91 90 90  Resp: 12 (!) 0 (!) 0 (!) 0  Temp: 97.9 F (36.6 C)     TempSrc: Axillary     SpO2: 97% 100% 100% 97%  Weight: 206 lb 3.2 oz (93.5 kg)     Height: 5\' 4"  (1.626 m)       Intake/Output Summary (Last 24 hours) at 04/20/16 1500 Last data filed at 04/20/16 0951  Gross per 24 hour  Intake              840 ml  Output              800 ml  Net               40 ml   Filed Weights   04/19/16 0639 04/20/16 0602 04/20/16 1345  Weight: 205 lb 7.5 oz (93.2 kg) 205 lb (93 kg) 206 lb 3.2 oz (93.5 kg)    Telemetry    AFib, rate controlled - Personally Reviewed  ECG      AFib, low voltage - Personally Reviewed from a few days ago  Physical Exam   GEN: unarousable , on BIPAP  , total body anasarca.  Has pitting edema of legs, abd., chest , shoulders, arms  Neck:  +++ JVD ; distended veins on forehead Cardiac: irregularly irregular, no murmurs, rubs, or gallops.  Respiratory:  on BIPAP  GI:  + pitting edema  MS:  Massive anasarca  Neuro:  nonresponsive Psych: Normal affect   Labs    Chemistry  Recent Labs Lab 04/05/2016 1625  04/18/16 0242 04/19/16 0355 04/20/16 1230  NA 139  < > 139 138 137  K 4.0  < > 4.8 5.0 3.9  CL 100*  < > 99* 100* 95*  CO2 28  < > 31 33* 34*  GLUCOSE 92  < > 93 86 105*  BUN 35*  < > 34* 35* 35*  CREATININE 1.13*  < > 1.23* 1.17* 1.20*  CALCIUM 9.9  < > 9.7 9.8 9.8  PROT  --   --   --   --  7.3  ALBUMIN 3.0*  --   --   --  3.3*  AST  --   --   --   --  22  ALT  --   --   --   --  6*  ALKPHOS  --   --   --   --  95  BILITOT  --   --   --   --  0.9  GFRNONAA 45*  < > 40* 43* 42*  GFRAA 52*  < > 47* 50* 48*  ANIONGAP 11  < > 9 5 8   < > = values in this interval not displayed.   Hematology  Recent Labs Lab 03/25/2016 1625 04/15/16 0320 04/18/16 0242  WBC 4.7 4.9 5.3  RBC 2.89* 2.87* 2.93*  HGB 9.3* 9.1* 9.3*  HCT 30.4* 30.1* 31.5*  MCV 105.2* 104.9* 107.5*  MCH 32.2 31.7 31.7  MCHC 30.6 30.2 29.5*  RDW 15.8* 15.8* 16.0*  PLT 114* 126* 115*    Cardiac Enzymes  Recent Labs Lab 04/09/2016 1625  TROPONINI <0.03     Recent Labs Lab 04/07/2016 1657  TROPIPOC 0.02     BNP  Recent Labs Lab 03/30/2016 1625  BNP 384.4*     DDimer No results for input(s): DDIMER in the last 168 hours.   Radiology    Dg Chest Port 1 View  Result Date: 04/20/2016 CLINICAL DATA:  Shortness of breath EXAM: PORTABLE CHEST 1 VIEW COMPARISON:  Multiple prior chest x-rays. FINDINGS: Stable cardiac enlargement. Could not exclude a component of pericardial effusion. Tortuosity and calcification of the thoracic aorta is again  noted. Low lung volumes with vascular crowding and bibasilar atelectasis. No obvious infiltrates or effusions. IMPRESSION: Stable cardiac enlargement. Low lung volumes with vascular crowding and basilar atelectasis. Electronically Signed   By: Marijo Sanes M.D.   On: 04/20/2016 12:44    Cardiac Studies   Echo: no pericardial effusion; dilated right heart  Patient Profile     82 y.o. female with severe right heart failure, edema  Assessment & Plan    1) Acute on chronic systolic right heart failure: Chronic cor pulmonale-  Continue Lasix. Renal function unchanged. She is still quite volume overloaded.      She is now unresponsive , on BIPAP I have personally reviewed the echo. She has severe Cor Pulmonale. The RV function is severely depressed  I think her chances of survival are nil  I have talked to Dr. Candiss Norse and we agree that a palliative care approach is probably best  I have entered the order   2. Atrial fibrillation: rate well controlled. Per prior notes unable to take anticoagulation.  CHADS2VASc 4  Signed, Mertie Moores, MD  04/20/2016, 3:00 PM

## 2016-04-20 NOTE — Progress Notes (Signed)
Physical Therapy Treatment Patient Details Name: Victoria Fisher MRN: [id number] DOB: 1935/01/28 Today's Date: 04/20/2016    History of Present Illness Pt is an 82 y/o female admitted from SNF ([facility]) secondary to dyspnea and edema. PMH including but not limited to a-fib, CHF, COPD, CAD, and HTN.    PT Comments    Pt was able to stand and do SPT today with MOD A of 2 for safety. Pt on 3.5 L/min and o2 99% in the bed.  With transfer it dropped to 86%, but returned to the 90's within 20 seconds.  Pt was set up in recliner and therapists were about to leave, when nursing stated that IV team coming to put a PICC line and pt needed to be in the bed, so pt returned to bed.  MAX A for rolling and re-positioning the pad. Pt left with HOB elevated with IV team in room.  Noted later, that rapid response called to room while IV team still in room and pt was going to be transferred to 4N.   Follow Up Recommendations  SNF     Equipment Recommendations  None recommended by PT    Recommendations for Other Services       Precautions / Restrictions Precautions Precautions: Fall Restrictions Weight Bearing Restrictions: No    Mobility  Bed Mobility Overal bed mobility: Needs Assistance Bed Mobility: Rolling;Supine to Sit;Sit to Supine Rolling: Mod assist;+2 for safety/equipment   Supine to sit: Max assist;+2 for physical assistance;HOB elevated Sit to supine: Max assist;+2 for physical assistance;+2 for safety/equipment   General bed mobility comments: o2 99% at 3.5 L/min. Pt requires VCs for hand placement. Did not tolerat head flat for re-positioning for long  Transfers Overall transfer level: Needs assistance Equipment used: Rolling walker (2 wheeled) Transfers: Sit to/from Stand Sit to Stand: Mod assist;+2 safety/equipment Stand pivot transfers: Mod assist;+2 safety/equipment       General transfer comment: MOD A to power up with 2nd person for safety.  Cueing for proper  technique. o2 86% after transfer, but increased into 90's within 20 seconds on 3.5 L/min  Ambulation/Gait                 Stairs            Wheelchair Mobility    Modified Rankin (Stroke Patients Only)       Balance Overall balance assessment: Needs assistance Sitting-balance support: Feet supported;Bilateral upper extremity supported Sitting balance-Leahy Scale: Fair     Standing balance support: Bilateral upper extremity supported Standing balance-Leahy Scale: Poor Standing balance comment: requires B UE support                    Cognition Arousal/Alertness: Awake/alert Behavior During Therapy: Anxious Overall Cognitive Status: Within Functional Limits for tasks assessed                 General Comments: Pt alert and oriented, but did demonstrate some memory deficits at time during session.    Exercises      General Comments        Pertinent Vitals/Pain Pain Assessment: Faces Faces Pain Scale: Hurts little more Pain Location: upper back Pain Descriptors / Indicators: Sore Pain Intervention(s): Monitored during session;Repositioned    Home Living Family/patient expects to be discharged to:: Skilled nursing facility             Home Equipment: Shower seat Additional Comments: Pt gets A when bathing    Prior Function Level  of Independence: Needs assistance    ADL's / Homemaking Assistance Needed: Pt reports that she would get A with dressing and bathing Comments: Pt seemed confused and had difficulty remember PLOF   PT Goals (current goals can now be found in the care plan section) Acute Rehab PT Goals Patient Stated Goal: return to [facility] PT Goal Formulation: With patient Time For Goal Achievement: 04/30/16 Potential to Achieve Goals: Good Progress towards PT goals: Progressing toward goals    Frequency    Min 2X/week      PT Plan Current plan remains appropriate    Co-evaluation PT/OT/SLP  Co-Evaluation/Treatment: Yes Reason for Co-Treatment: For patient/therapist safety PT goals addressed during session: Mobility/safety with mobility;Balance       End of Session Equipment Utilized During Treatment: Gait belt;Oxygen Activity Tolerance: Patient limited by fatigue Patient left: in bed;with call bell/phone within reach;with bed alarm set;Other (comment) (IV team entering) Nurse Communication: Mobility status PT Visit Diagnosis: Muscle weakness (generalized) (M62.81);Difficulty in walking, not elsewhere classified (R26.2)     Time: [id number] PT Time Calculation (min) (ACUTE ONLY): 52 min  Charges:  $Therapeutic Activity: 8-22 mins                    G Codes:       Keaton Stirewalt LUBECK 04/20/2016, 1:24 PM

## 2016-04-20 NOTE — Patient Outreach (Signed)
[facility]) Care Management  04/20/2016  Victoria Fisher Jan 17, 1935 [id number]  Assessment- Patient remains hospitalized. Per chart, patient still feels weak and continues to experience SOB. CSW will continue to follow patient and await for discharge back to SNF where she will be followed by CSW.  Plan-CSW will continue to follow case and await for discharge back to SNF.  [name], BSW, MSW, [email or web address] Phone: [phone] Fax: [phone]

## 2016-04-20 NOTE — Significant Event (Signed)
Rapid Response Event Note  Overview:  Called to assist with patient with resp distress Time Called: 1210 Arrival Time: 1215 Event Type: Respiratory  Initial Focused Assessment:  On arrival patient sitting up in bed - warm and dry- alert but somewhat confused - resps rapid with use of accessory muscles - bil BS with exp wheezing noted - just finishing nebulizer treatment - patient states the increased WOB has been gradually getting worse this AM - she has an infiltrated IV which IV Lasix was given - possibly at 6 am dose and recent dose - little urine output today per RN Lauren. 3+ edema noted bilateral legs and arms.  She had recently worked with PT to sit in the chair - and WOB increased after that.  Speaking but broken sentences.  Difficult to obtain waveform on O2 sats restless.  BP 172/7- HR 91 on monitor RR 28-32.     Interventions:  Stat PCXR per Dr. Candiss Norse.  IV team present - placed new IV by ultrasound guidance - repeated Lasix dose.  Stat call for Bipap - Karen Kays NP with PCCM to bedside- Bipap initatited - 125 mg Solumedrol IV given by Ander Purpura RN.  Decreased WOB with Bipap - tolerated well.  Family updated by Ander Purpura RN.  Handoff to Katie RT and Lauren RN - will follow as needed.  To transfer to SDU. Follow Up:  Tol BIpap - transferred to 4N13 on BIpap with Katie RT and Lauren RN - tol well.   Handoff to [employer].    Plan of Care (if not transferred):  Event Summary: Name of Physician Notified: Dr. Candiss Norse at  (PTA RRT)  Name of Consulting Physician Notified: Dr. Vanessa Kick PCCM at  (by Dr. Candiss Norse)  Outcome: Transferred (Comment) [phone])     Quin Hoop

## 2016-04-20 NOTE — Progress Notes (Signed)
RT entered to find pt with 134LPM leak on bipap and pt was sleeping.  RT removed bipap at this time and pt is now on RA satting 94-95%.  RT will continue to monitor.

## 2016-04-20 NOTE — Progress Notes (Signed)
While assessing for PICC placement, noted patient is very unstable and complaining of difficulty of breathing. RN made aware. IV placed  left upper arm and blood drawn. Will continue to monitor.

## 2016-04-20 NOTE — Consult Note (Signed)
Name: [name] MRN: [id number] DOB: 11-Mar-1934    ADMISSION DATE:  03/21/2016 CONSULTATION DATE:  04/20/16  REFERRING MD :  Candiss Norse  CHIEF COMPLAINT:  SOB   HISTORY OF PRESENT ILLNESS:  Victoria Fisher is a 82 y.o. female with a PMH as outlined below including diastolic right sided heart failure due to severe TR, COPD for which she is 2-3L O2 dependent.  She currently resides at [facility]  She was brought to ED 02/271/8 for dyspnea and worsening LE edema despite compliance with lasix (she had reportedly felt that she was 40lbs over her dry weight).  She was admitted and treated for AoC dCHF, COPD, PAH with cor pulmonale.  She was seen by cardiology who agreed with aggressive diuresis given her known chronic right sided heart failure (hx dilated RV with severe TR).  She had echo done 03/20/2016 which demonstrated EF 55-60%, RV volume overload, mild MR, severe LA/RA/RV dilatation, wide open TR, PAP 42.   She was continued on diuretics, dose increased up to 80mg  TID (cards suggesting 120mg  BID if no improvement).    Due to persistent symptoms, PCCM asked to see 04/20/16.  Per rapid response, IV had been questionable and last dose of lasix likely not delivered appropriately due to IV malpositioned.   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  has a past surgical history that includes Appendectomy (1956); Abdominal hysterectomy (1983); Colon resection; Cholecystectomy (1984); Colon biopsy; Cardiac catheterization (02/12/03); and Colonoscopy (07/23/10). Prior to Admission medications   Medication Sig Start Date End Date Taking? Authorizing Provider  acetaminophen (TYLENOL) 325 MG tablet Take 650 mg by mouth 3 (three) times daily.   Yes Historical Provider, MD  bisacodyl (DULCOLAX) 10 MG suppository Place 10 mg rectally daily as needed for moderate constipation. Give 1 now and then qd prn   Yes Historical Provider, MD  furosemide (LASIX) 40 MG tablet Take 40-80 mg by mouth See admin instructions. Take 80 mg QAM, 40 QPM    Yes Historical Provider, MD  ipratropium-albuterol (DUONEB) 0.5-2.5 (3) MG/3ML SOLN Take 3 mLs by nebulization every 2 (two) hours as needed. 03/05/16  Yes Robbie Lis, MD  lactulose (CHRONULAC) 10 GM/15ML solution Take 30 g by mouth 2 (two) times daily as needed for mild constipation.    Yes Historical Provider, MD  levothyroxine (SYNTHROID, LEVOTHROID) 125 MCG tablet Take 125 mcg by mouth daily before breakfast.   Yes Historical Provider, MD  metolazone (ZAROXOLYN) 2.5 MG tablet Take 2.5 mg by mouth daily.   Yes Historical Provider, MD  metoprolol succinate (TOPROL-XL) 50 MG 24 hr tablet Take 50 mg by mouth daily. Take with or immediately following a meal.   Yes Historical Provider, MD  morphine (MSIR) 15 MG tablet Take 1 tablet (15 mg total) by mouth 3 (three) times daily as needed (breakthrough pain). 03/13/16  Yes Gildardo Cranker, DO  Morphine Sulfate (MORPHINE CONCENTRATE) 10  MG/0.5ML SOLN concentrated solution Take 0.5 mLs (10 mg total) by mouth 3 (three) times daily. 03/05/16  Yes Robbie Lis, MD  ondansetron (ZOFRAN) 4 MG tablet Take 4 mg by mouth every 8 (eight) hours as needed for nausea  or vomiting.   Yes Historical Provider, MD  OVER THE COUNTER MEDICATION Take 120 mLs by mouth 2 (two) times daily between meals. medpass   Yes Historical Provider, MD  polyethylene glycol (MIRALAX / GLYCOLAX) packet Take 17 g by mouth 2 (two) times daily. Mix in 8 oz liquid and drink   Yes Historical Provider, MD  potassium chloride SA (K-DUR,KLOR-CON) 20 MEQ tablet Take 20 mEq by mouth 2 (two) times daily.    Yes Historical Provider, MD  Probiotic Product (PROBIOTIC DAILY) CAPS Take 1 capsule by mouth daily.   Yes Historical Provider, MD  sennosides-docusate sodium (SENOKOT-S) 8.6-50 MG tablet Take 2 tablets by mouth 2 (two) times daily.   Yes Historical Provider, MD  vitamin E 400 UNIT capsule Take 400 Units by mouth daily.   Yes Historical Provider, MD   Allergies  Allergen Reactions  . Codeine Anaphylaxis, Hives and Nausea And Vomiting  . Gabapentin Swelling and Other (See Comments)    Hallucinations, severe facial swelling   . Other Other (See Comments)    Will not accept blood or blood products - patient is Jehovah's Witness  . Ciprofloxacin Hives and Nausea And Vomiting  . Latex Hives and Rash  . Penicillins Hives    Has patient had a PCN reaction causing immediate rash, facial/tongue/throat swelling, SOB or lightheadedness with hypotension: Yes Has patient had a PCN reaction causing severe rash involving mucus membranes or skin necrosis: Yes Has patient had a PCN reaction that required hospitalization Yes Has patient had a PCN reaction occurring within the last 10 years: Yes If all of the above answers are "NO", then may proceed with Cephalosporin use.  . Sulfonamide Derivatives Hives and Rash    FAMILY HISTORY:  family history includes Alzheimer's disease in her mother; Diabetes in her brother; Heart disease in her father; Parkinsonism in her brother. SOCIAL HISTORY:  reports that she has never smoked. She has never used smokeless tobacco. She reports that she does not drink  alcohol or use drugs.  REVIEW OF SYSTEMS:   All negative; except for those that are bolded, which indicate positives.  Constitutional: weight loss, weight gain, night sweats, fevers, chills, fatigue, weakness.  HEENT: headaches, sore throat, sneezing, nasal congestion, post nasal drip, difficulty swallowing, tooth/dental problems, visual complaints, visual changes, ear aches. Neuro: difficulty with speech, weakness, numbness, ataxia. CV:  chest pain, orthopnea, PND, swelling in lower extremities, dizziness, palpitations, syncope.  Resp: cough, hemoptysis, dyspnea, wheezing. GI: heartburn, indigestion, abdominal pain, nausea, vomiting, diarrhea, constipation, change in bowel habits, loss of appetite, hematemesis, melena, hematochezia.  GU: dysuria, change in color of urine, urgency or frequency, flank pain, hematuria. MSK: joint pain or swelling, decreased range of motion. Psych: change in mood or affect, depression, anxiety, suicidal ideations, homicidal ideations. Skin: rash, itching, bruising.    SUBJECTIVE:  Decompensated after working with PT and lying flat for PICC line.  Now with increased WOB and currently being placed on BiPAP.  VITAL SIGNS: Temp:  [97.4 F (36.3 C)-97.7 F (36.5 C)] 97.7 F (36.5 C) (03/05 0602) Pulse Rate:  [62-79] 75 (03/05 0602) Resp:  [18-20] 18 (03/05 0602) BP: (100-140)/(65-90) 140/90 (03/05 0602) SpO2:  [98 %-100 %] 99 % (03/05 0602) Weight:  [93 kg (205  lb)] 93 kg (205 lb) (03/05 0602)  PHYSICAL EXAMINATION: General: Chronically ill appearing female, resting in bed, in mild resp distress. Neuro: A&O x 3, non-focal.  HEENT: [place]/AT. PERRL, sclerae anicteric. Cardiovascular: RRR, no M/R/G. Lungs: Respirations mildly labored, faint wheeze bilaterally (?cardiac wheeze). Abdomen: BS x 4, soft, NT/ND.  Musculoskeletal: No gross deformities, 3+ edema (known lymphedema) Skin: Intact, warm, BLE wrapped in unna boots.     Recent Labs Lab  04/17/16 0236 04/18/16 0242 04/19/16 0355  NA 136 139 138  K 4.5 4.8 5.0  CL 100* 99* 100*  CO2 27 31 33*  BUN 34* 34* 35*  CREATININE 1.17* 1.23* 1.17*  GLUCOSE 85 93 86    Recent Labs Lab 04/02/2016 1625 04/15/16 0320 04/18/16 0242  HGB 9.3* 9.1* 9.3*  HCT 30.4* 30.1* 31.5*  WBC 4.7 4.9 5.3  PLT 114* 126* 115*   No results found.  STUDIES:  Echo 03/22/2016 > EF 55-60%, RV volume overaload, mild MR, severe LA/RA/RV dilatation, wide open TR, PAP 42.  SIGNIFICANT EVENTS  2/27 > admit. 3/05 > PCCM consult.  ASSESSMENT / PLAN:  AoC dCHF  - echo from 2/27 with EF 55-60%, RV volume overaload, mild MR, severe LA/RA/RV dilatation, wide open TR, PAP 42.  Agree that her chronic right heart failure certainly predisposes her to chronic volume overload, SOB, etc.  Of note, she had had problems with following up with outpatient cardiology appointments as well as medication compliance. Plan: Cards following and managing diuresis. Agree with aggressive diuresis. Would place foley given high dose diuretics + incontinence.  AoC hypoxic respiratory failure - multifactorial due to alveolar hypoventilation from obesity + chronic opioids, alveolar shunt from edema / volume overload. COPD per report (no PFT's in system) - on chronic 2-3L O2. PAH - PAP 42 on echo 04/13/2016. OSA - poor compliance with CPAP in past. Mild bilateral pleural effusions - suspect due to volume overload in setting severe right heart failure. Plan: Continue supplemental O2 as needed to maintain SpO2 > 92%. Continue aggressive diuresis as above, increase to 120mg  BID per cards recs. Continue BD's (DuoNebs, Albuterol). Solumedrol 125mg  x 1. Assess V/Q scan. Assess ANA, ANCA, RF, CCP, scl-70, HIV, LFT's.  Concern for opioid dependence - per pt's son, she has hx of "manipulative behavior" and "hoarding opioids". Plan: Caution with pain management.   Nothing further to add.  Continue with supportive care and follow  autoimmune labs as well as V/Q.  Fully agree with DNR status.   Montey Hora, [facility] Pager: [phone]  or [phone] 04/20/2016, 12:20 PM

## 2016-04-20 NOTE — Progress Notes (Signed)
IV team nurse called primary nurse to room because patient complaining of pain and shortness of breath.  Patient oxygen saturation reading 85% on 6L oxygen.  Patient given a breathing treatment after hearing wheezing.  Dr. Candiss Norse paged who ordered stat CXR and bipap if needed after breathing treatment. Rapid response called to bedside to assess patient.  Prior IV infiltrated and IV team started a new IV and patient given lasix and solumedrol as ordered.  Will continue to monitor.

## 2016-04-20 NOTE — Progress Notes (Signed)
[facility] TEAM 1 - Stepdown/ICU TEAM  JESSIA LUSCO  [id number] DOB: Nov 20, 1934 DOA: 03/31/2016 PCP: Haywood Pao, MD    Brief Narrative:   82 y.o. female with history of chronic diastolic CHF, cor pulmonale, Pulm HTN, COPD, chronic resp failure on 3-4L O2, HTN, chronic lymphedema, CAD, CKD Stage 3, Parox A. fib, chronic pain syndrome, COPD, frequent falls, hypothyroid and OSA who presented to ED from her SNF due to dyspnea and worsening edema from her legs to her abdomen despite being on lasix 80 and 40mg  daily.  She felt she was possibly 40lbs above her usual weight.  Subjective:  The patient feels weak today.  Still has SOB.  She denies chest pain.  No abd pain or focal weakness.     Assessment & Plan:   Acute on chronic diastolic CHF with ANASARCA - Cor Pulmonale - Pulm HTN with Severe TR -   baseline weight ~75kg on 02/19/16  - EF 55-60% w/o WMA w/ severely dilated RV and RA and wide open TR regurgitation - cont to diurese and follow renal fxn, applied UNNA boots - making very slow progress at this time , she is incontinent hence I&Os are incorrect, monitor weight. Cards following, however her main problem appears to be coming from cor pulmonale, will request pulmonary also to evaluate and provide long-term plan and regimen for her.  Filed Weights   04/18/16 2124 04/19/16 0639 04/20/16 0602  Weight: 91.9 kg (202 lb 9.6 oz) 93.2 kg (205 lb 7.5 oz) 93 kg (205 lb)    COPD With cor pulmonale OPD currently compensated, supportive care  Chronic pain syndrome No evidence of uncontrolled pain at this time, reviewed old chart does have narcotic seeking behavior per her son. Monitor closely.  Lymphedema with chronic pain Unna boots changed per ortho tech  Anemia in chronic kidney disease Hgb is stable   CKD 3 Creatinine stable - follow with ongoing diuresis -    Recent Labs Lab 04/15/16 0320 04/16/16 [id number] 04/17/16 0236 04/18/16 0242 04/19/16 0355  CREATININE  1.02* 1.10* 1.17* 1.23* 1.17*   Parox Afib CHADsvasc score>3 - not on anticoagulation due to falls - rate well controlled    DVT prophylaxis: Lovenox Code Status: DNR - NO CODE Family Communication: no family present at time of exam  Disposition Plan: stable for transfer to CHF floor - cont PT/OT - will likely require SNF  Consultants:  [facility] Card[facility] , pulmonary  Procedures:  PICC line requested on 04/20/2016   2/27 TTE - - Left ventricle: The cavity size was normal. Wall thickness was normal. Systolic function was normal. The estimated ejection fraction was in the range of 55% to 60%. Wall motion was normal; there were no regional wall motion abnormalities. - Ventricular septum: The contour showed diastolic flattening. These changes are consistent with RV volume overload. - Mitral valve: There was mild to moderate regurgitation. - Left atrium: The atrium was moderately to severely dilated. - Right ventricle: The cavity size was severely dilated. Systolic function was mildly to moderately reduced. - Right atrium: The atrium was massively dilated. - Tricuspid valve: There was malcoaptation of the valve leaflets. There was wide-open regurgitation directed centrally. A diagnosis of severe regurgitation is supported by hepatic vein systolic flow reversal. - Pulmonary arteries: Systolic pressure was mildly increased. PA peak pressure: 42 mm Hg (S). - Pericardium, extracardiac: There was a left pleural effusion   Antimicrobials:  None  Objective: Blood pressure 140/90, pulse 75, temperature 97.7 F (36.5  C), temperature source Oral, resp. rate 18, height 5\' 4"  (1.626 m), weight 93 kg (205 lb), SpO2 99 %.  Intake/Output Summary (Last 24 hours) at 04/20/16 1014 Last data filed at 04/20/16 0951  Gross per 24 hour  Intake             1080 ml  Output              800 ml  Net              280 ml   Filed Weights   04/18/16 2124 04/19/16 0639 04/20/16 0602  Weight: 91.9 kg (202 lb  9.6 oz) 93.2 kg (205 lb 7.5 oz) 93 kg (205 lb)    Examination: General: No acute respiratory distress at rest in chair  Lungs: bibasilar crackles persist - no wheezing  Cardiovascular: distant HS - reg rate  Abdomen: Nontender, obese and distended, soft, bowel sounds positive Extremities: 2+ anasarca w/o signif change, +ve UNNA boots  CBC:  Recent Labs Lab 03/30/2016 1625 04/15/16 0320 04/18/16 0242  WBC 4.7 4.9 5.3  HGB 9.3* 9.1* 9.3*  HCT 30.4* 30.1* 31.5*  MCV 105.2* 104.9* 107.5*  PLT 114* 126* [id number]   Basic Metabolic Panel:  Recent Labs Lab 04/15/16 0320 04/16/16 0648 04/17/16 0236 04/18/16 0242 04/19/16 0355  NA 139 138 136 139 138  K 4.0 4.1 4.5 4.8 5.0  CL 101 101 100* 99* 100*  CO2 29 29 27 31  33*  GLUCOSE 80 77 85 93 86  BUN 34* 35* 34* 34* 35*  CREATININE 1.02* 1.10* 1.17* 1.23* 1.17*  CALCIUM 9.8 9.6 9.5 9.7 9.8   GFR: Estimated Creatinine Clearance: 42.4 mL/min (by C-G formula based on SCr of 1.17 mg/dL (H)).  Liver Function Tests:  Recent Labs Lab 03/29/2016 1625  ALBUMIN 3.0*   Cardiac Enzymes:  Recent Labs Lab 04/13/2016 1625  TROPONINI <0.03    HbA1C: Hgb A1c MFr Bld  Date/Time Value Ref Range Status  03/12/2008 04:00 AM  4.6 - 6.1 % Final   6.1 (NOTE)   The ADA recommends the following therapeutic goal for glycemic   control related to Hgb A1C measurement:   Goal of Therapy:   < 7.0% Hgb A1C   Reference: American Diabetes Association: Clinical Practice   Recommendations 2008, Diabetes Care,  2008, 31:(Suppl 1).    Recent Results (from the past 240 hour(s))  MRSA PCR Screening     Status: None   Collection Time: 04/05/2016  8:53 PM  Result Value Ref Range Status   MRSA by PCR NEGATIVE NEGATIVE Final    Comment:        The GeneXpert MRSA Assay (FDA approved for NASAL specimens only), is one component of a comprehensive MRSA colonization surveillance program. It is not intended to diagnose MRSA infection nor to guide or monitor  treatment for MRSA infections.      Scheduled Meds: . enoxaparin (LOVENOX) injection  40 mg Subcutaneous Q24H  . furosemide  80 mg Intravenous Q8H  . levothyroxine  125 mcg Oral QAC breakfast  . metolazone  5 mg Oral Daily  . metoprolol succinate  50 mg Oral Daily  . polyethylene glycol  17 g Oral Daily  . senna-docusate  2 tablet Oral BID  . sodium chloride flush  3 mL Intravenous Q12H     LOS: 6 days   Signature  Lala Lund K M.D on 04/20/2016 at 10:14 AM  Between 7am to 7pm - Pager - [phone], After 7pm go to  [email or web address] - password [facility]  [facility]  - Office  [phone]

## 2016-04-20 NOTE — Progress Notes (Signed)
Patient's grandson Eian returned phone call and was updated with patient's condition and transfer to [facility]

## 2016-04-21 ENCOUNTER — Inpatient Hospital Stay (HOSPITAL_COMMUNITY): Payer: Medicare Other

## 2016-04-21 ENCOUNTER — Telehealth: Payer: Self-pay | Admitting: Neurology

## 2016-04-21 DIAGNOSIS — I509 Heart failure, unspecified: Secondary | ICD-10-CM

## 2016-04-21 LAB — CBC
HCT: 33.1 % — ABNORMAL LOW (ref 36.0–46.0)
HEMOGLOBIN: 9.8 g/dL — AB (ref 12.0–15.0)
MCH: 31.7 pg (ref 26.0–34.0)
MCHC: 29.6 g/dL — AB (ref 30.0–36.0)
MCV: 107.1 fL — ABNORMAL HIGH (ref 78.0–100.0)
Platelets: 117 10*3/uL — ABNORMAL LOW (ref 150–400)
RBC: 3.09 MIL/uL — ABNORMAL LOW (ref 3.87–5.11)
RDW: 15.5 % (ref 11.5–15.5)
WBC: 14.2 10*3/uL — ABNORMAL HIGH (ref 4.0–10.5)

## 2016-04-21 LAB — BASIC METABOLIC PANEL
ANION GAP: 10 (ref 5–15)
BUN: 40 mg/dL — ABNORMAL HIGH (ref 6–20)
CALCIUM: 9.6 mg/dL (ref 8.9–10.3)
CHLORIDE: 96 mmol/L — AB (ref 101–111)
CO2: 29 mmol/L (ref 22–32)
CREATININE: 1.26 mg/dL — AB (ref 0.44–1.00)
GFR calc non Af Amer: 39 mL/min — ABNORMAL LOW (ref >60)
GFR, EST AFRICAN AMERICAN: 45 mL/min — AB (ref >60)
Glucose, Bld: 99 mg/dL (ref 65–99)
Potassium: 4.3 mmol/L (ref 3.5–5.1)
SODIUM: 135 mmol/L (ref 135–145)

## 2016-04-21 LAB — ANTI-SCLERODERMA ANTIBODY: Scleroderma (Scl-70) (ENA) Antibody, IgG: <0.2 AI (ref 0.0–0.9)

## 2016-04-21 LAB — ANCA TITERS
Atypical P-ANCA titer: <1:20 {titer}
P-ANCA: <1:20 {titer}

## 2016-04-21 LAB — RHEUMATOID FACTOR: Rhuematoid fact SerPl-aCnc: 20.9 IU/mL — ABNORMAL HIGH (ref 0.0–13.9)

## 2016-04-21 LAB — MAGNESIUM: MAGNESIUM: 2.2 mg/dL (ref 1.7–2.4)

## 2016-04-21 LAB — CYCLIC CITRUL PEPTIDE ANTIBODY, IGG/IGA: CCP Antibodies IgG/IgA: 4 units (ref 0–19)

## 2016-04-21 LAB — HIV ANTIBODY (ROUTINE TESTING W REFLEX): HIV Screen 4th Generation wRfx: NONREACTIVE

## 2016-04-21 MED ORDER — SODIUM CHLORIDE 0.9% FLUSH
10.0000 mL | Freq: Two times a day (BID) | INTRAVENOUS | Status: DC
  Administered 2016-04-21: 20 mL
  Administered 2016-04-21 – 2016-04-22 (×2): 10 mL

## 2016-04-21 MED ORDER — SODIUM CHLORIDE 0.9% FLUSH: 10.0000 mL | INTRAVENOUS | Status: DC | PRN

## 2016-04-21 MED ORDER — PHENAZOPYRIDINE HCL 200 MG PO TABS
200.0000 mg | ORAL_TABLET | Freq: Three times a day (TID) | ORAL | Status: DC | PRN
  Administered 2016-04-21: 200 mg via ORAL
  Filled 2016-04-21 (×2): qty 1

## 2016-04-21 NOTE — Progress Notes (Signed)
Pt in no distress and bipap not needed at this time.  RT will monitor.

## 2016-04-21 NOTE — Progress Notes (Signed)
Nutrition Brief Note  Chart reviewed. Pt now transitioning to comfort care.  No further nutrition interventions warranted at this time.  Please re-consult as needed.   Solene Hereford A. Macaela Presas, RD, LDN, CDE Pager: [phone] After hours Pager: [phone]  

## 2016-04-21 NOTE — Progress Notes (Signed)
Peripherally Inserted Central Catheter/Midline Placement  The IV Nurse has discussed with the patient and/or persons authorized to consent for the patient, the purpose of this procedure and the potential benefits and risks involved with this procedure.  The benefits include less needle sticks, lab draws from the catheter, and the patient may be discharged home with the catheter. Risks include, but not limited to, infection, bleeding, blood clot (thrombus formation), and puncture of an artery; nerve damage and irregular heartbeat and possibility to perform a PICC exchange if needed/ordered by physician.  Alternatives to this procedure were also discussed.  B[employer] PICC patient education guide, fact sheet on infection prevention and patient information card has been provided to patient /or left at bedside.    PICC/Midline Placement Documentation        Victoria Fisher 04/21/2016, 10:39 AM

## 2016-04-21 NOTE — Telephone Encounter (Signed)
Events noted

## 2016-04-21 NOTE — Progress Notes (Addendum)
[facility] TEAM 1 - Stepdown/ICU TEAM  Victoria Fisher  [id number] DOB: Mar 03, 1934 DOA: 03/20/2016 PCP: Haywood Pao, MD    Brief Narrative:   82 y.o. female with history of chronic diastolic CHF, cor pulmonale, Pulm HTN, COPD, chronic resp failure on 3-4L O2, HTN, chronic lymphedema, CAD, CKD Stage 3, Parox A. fib, chronic pain syndrome, COPD, frequent falls, hypothyroid and OSA who presented to ED from her SNF due to dyspnea and worsening edema from her legs to her abdomen despite being on lasix 80 and 40mg  daily.  She felt she was possibly 40lbs above her usual weight.  Subjective:  The patient feels weak today.  Still has SOB.  She denies chest pain.  No abd pain or focal weakness.     Assessment & Plan:   Acute on chronic diastolic CHF with ANASARCA - Cor Pulmonale - Pulm HTN with Severe TR -   baseline weight ~75kg on 02/19/16  - EF 55-60% w/o WMA w/ severely dilated RV and RA and wide open TR regurgitation - cont to diurese and follow renal fxn, applied UNNA boots - making very slow progress at this time , she is incontinent hence I&Os are incorrect, monitor weight.   She is been seen both by cardiology and pulmonary this admission, her main issue seems to be pulmonary hypertension with cor pulmonale and severe tricuspid regurgitation, at this age these are unfixable issues, had detailed discussions with pulmonary and cardiology, also discussed with her son and patient. Plan now is gentle medical treatment. Palliative care input. If she continues to decline. Hospice.  Filed Weights   04/20/16 0602 04/20/16 1345 04/21/16 0300  Weight: 93 kg (205 lb) 93.5 kg (206 lb 3.2 oz) 93.6 kg (206 lb 6.4 oz)    COPD With cor pulmonale COPD currently compensated, supportive care  Chronic pain syndrome No evidence of uncontrolled pain at this time, reviewed old chart does have narcotic seeking behavior per her son. Monitor closely.  Lymphedema with chronic pain Unna boots changed  per ortho tech  Anemia in chronic kidney disease Hgb is stable   CKD 3 Creatinine stable - follow with ongoing diuresis -    Recent Labs Lab 04/17/16 0236 04/18/16 0242 04/19/16 0355 04/20/16 1230 04/21/16 0829  CREATININE 1.17* 1.23* 1.17* 1.20* 1.26*   Parox Afib CHADsvasc score>3 - not on anticoagulation due to falls - rate well controlled    DVT prophylaxis: Lovenox Code Status: DNR - NO CODE Family Communication: son over the phone Disposition Plan: stable for transfer to CHF floor - cont PT/OT - will likely require SNF  Consultants:  Cardiology , Pulmonary, Pall care  Procedures:  PICC line requested on 04/20/2016   2/27 TTE - - Left ventricle: The cavity size was normal. Wall thickness was normal. Systolic function was normal. The estimated ejection fraction was in the range of 55% to 60%. Wall motion was normal; there were no regional wall motion abnormalities. - Ventricular septum: The contour showed diastolic flattening. These changes are consistent with RV volume overload. - Mitral valve: There was mild to moderate regurgitation. - Left atrium: The atrium was moderately to severely dilated. - Right ventricle: The cavity size was severely dilated. Systolic function was mildly to moderately reduced. - Right atrium: The atrium was massively dilated. - Tricuspid valve: There was malcoaptation of the valve leaflets. There was wide-open regurgitation directed centrally. A diagnosis of severe regurgitation is supported by hepatic vein systolic flow reversal. - Pulmonary arteries: Systolic pressure was  mildly increased. PA peak pressure: 42 mm Hg (S). - Pericardium, extracardiac: There was a left pleural effusion   Antimicrobials:  None  Objective: Blood pressure 107/68, pulse 75, temperature 97.4 F (36.3 C), temperature source Oral, resp. rate 11, height 5\' 4"  (1.626 m), weight 93.6 kg (206 lb 6.4 oz), SpO2 97 %.  Intake/Output Summary (Last 24 hours) at  04/21/16 1048 Last data filed at 04/21/16 0300  Gross per 24 hour  Intake              422 ml  Output                0 ml  Net              422 ml   Filed Weights   04/20/16 0602 04/20/16 1345 04/21/16 0300  Weight: 93 kg (205 lb) 93.5 kg (206 lb 3.2 oz) 93.6 kg (206 lb 6.4 oz)    Examination: General: No acute respiratory distress at rest in chair  Lungs: bibasilar crackles persist - no wheezing  Cardiovascular: distant HS - reg rate  Abdomen: Nontender, obese and distended, soft, bowel sounds positive Extremities: 2+ anasarca w/o signif change, +ve UNNA boots  CBC:  Recent Labs Lab 03/21/2016 1625 04/15/16 0320 04/18/16 0242 04/21/16 0829  WBC 4.7 4.9 5.3 14.2*  HGB 9.3* 9.1* 9.3* 9.8*  HCT 30.4* 30.1* 31.5* 33.1*  MCV 105.2* 104.9* 107.5* 107.1*  PLT 114* 126* 115* [id number]*   Basic Metabolic Panel:  Recent Labs Lab 04/17/16 0236 04/18/16 0242 04/19/16 0355 04/20/16 1230 04/21/16 0829  NA 136 139 138 137 135  K 4.5 4.8 5.0 3.9 4.3  CL 100* 99* 100* 95* 96*  CO2 27 31 33* 34* 29  GLUCOSE 85 93 86 105* 99  BUN 34* 34* 35* 35* 40*  CREATININE 1.17* 1.23* 1.17* 1.20* 1.26*  CALCIUM 9.5 9.7 9.8 9.8 9.6  MG  --   --   --  2.2 2.2   GFR: Estimated Creatinine Clearance: 39.5 mL/min (by C-G formula based on SCr of 1.26 mg/dL (H)).  Liver Function Tests:  Recent Labs Lab 04/03/2016 1625 04/20/16 1230  AST  --  22  ALT  --  6*  ALKPHOS  --  95  BILITOT  --  0.9  PROT  --  7.3  ALBUMIN 3.0* 3.3*   Cardiac Enzymes:  Recent Labs Lab 04/12/2016 1625  TROPONINI <0.03    HbA1C: Hgb A1c MFr Bld  Date/Time Value Ref Range Status  03/12/2008 04:00 AM  4.6 - 6.1 % Final   6.1 (NOTE)   The ADA recommends the following therapeutic goal for glycemic   control related to Hgb A1C measurement:   Goal of Therapy:   < 7.0% Hgb A1C   Reference: American Diabetes Association: Clinical Practice   Recommendations 2008, Diabetes Care,  2008, 31:(Suppl 1).    Recent Results  (from the past 240 hour(s))  MRSA PCR Screening     Status: None   Collection Time: 04/09/2016  8:53 PM  Result Value Ref Range Status   MRSA by PCR NEGATIVE NEGATIVE Final    Comment:        The GeneXpert MRSA Assay (FDA approved for NASAL specimens only), is one component of a comprehensive MRSA colonization surveillance program. It is not intended to diagnose MRSA infection nor to guide or monitor treatment for MRSA infections.      Scheduled Meds: . chlorhexidine  15 mL Mouth Rinse BID  .  enoxaparin (LOVENOX) injection  40 mg Subcutaneous Q24H  . furosemide  120 mg Intravenous BID  . levothyroxine  125 mcg Oral QAC breakfast  . mouth rinse  15 mL Mouth Rinse q12n4p  . metolazone  5 mg Oral Daily  . metoprolol succinate  50 mg Oral Daily  . polyethylene glycol  17 g Oral Daily  . senna-docusate  2 tablet Oral BID  . sodium chloride flush  3 mL Intravenous Q12H     LOS: 7 days   Signature  Lala Lund K M.D on 04/21/2016 at 10:48 AM  Between 7am to 7pm - Pager - [phone], After 7pm go to [email or web address] - password [facility]  [facility]  - Office  [phone]

## 2016-04-21 NOTE — Progress Notes (Signed)
Name: Victoria Fisher MRN: [id number] DOB: 07-04-1934    ADMISSION DATE:  04/15/2016 CONSULTATION DATE:  04/20/16  REFERRING MD :  Candiss Norse  CHIEF COMPLAINT:  SOB  SUBJECTIVE:  More awake this morning but after more discussions, decision made to move towards palliation.  VITAL SIGNS: Temp:  [97.2 F (36.2 C)-98.7 F (37.1 C)] 97.4 F (36.3 C) (03/06 0810) Pulse Rate:  [65-102] 75 (03/06 0810) Resp:  [0-22] 11 (03/06 0810) BP: (90-172)/(59-113) 107/68 (03/06 0810) SpO2:  [85 %-100 %] 97 % (03/06 0810) Weight:  [93.5 kg (206 lb 3.2 oz)-93.6 kg (206 lb 6.4 oz)] 93.6 kg (206 lb 6.4 oz) (03/06 0300)  PHYSICAL EXAMINATION: General: Chronically ill appearing female, sitting up in chair eating breakfast. Neuro: A&O x 3, non-focal.  HEENT: [place]/AT. PERRL, sclerae anicteric. Cardiovascular: RRR, no M/R/G. Lungs: Respirations unlabored. Abdomen: BS x 4, soft, NT/ND.  Musculoskeletal: No gross deformities, 3+ edema (known lymphedema) Skin: Intact, warm, BLE wrapped in unna boots.     Recent Labs Lab 04/19/16 0355 04/20/16 1230 04/21/16 0829  NA 138 137 135  K 5.0 3.9 4.3  CL 100* 95* 96*  CO2 33* 34* 29  BUN 35* 35* 40*  CREATININE 1.17* 1.20* 1.26*  GLUCOSE 86 105* 99    Recent Labs Lab 04/15/16 0320 04/18/16 0242 04/21/16 0829  HGB 9.1* 9.3* 9.8*  HCT 30.1* 31.5* 33.1*  WBC 4.9 5.3 14.2*  PLT 126* 115* 117*   Dg Chest Port 1 View  Result Date: 04/20/2016 CLINICAL DATA:  Shortness of breath EXAM: PORTABLE CHEST 1 VIEW COMPARISON:  Multiple prior chest x-rays. FINDINGS: Stable cardiac enlargement. Could not exclude a component of pericardial effusion. Tortuosity and calcification of the thoracic aorta is again noted. Low lung volumes with vascular crowding and bibasilar atelectasis. No obvious infiltrates or effusions. IMPRESSION: Stable cardiac enlargement. Low lung volumes with vascular crowding and basilar atelectasis. Electronically Signed   By: Marijo Sanes M.D.   On:  04/20/2016 12:44    STUDIES:  Echo 04/07/2016 > EF 55-60%, RV volume overaload, mild MR, severe LA/RA/RV dilatation, wide open TR, PAP 42.  SIGNIFICANT EVENTS  2/27 > admit. 3/05 > PCCM consult.  ASSESSMENT / PLAN:  AoC dCHF  - echo from 2/27 with EF 55-60%, RV volume overaload, mild MR, severe LA/RA/RV dilatation, wide open TR, PAP 42.  Agree that her chronic right heart failure certainly predisposes her to chronic volume overload, SOB, etc.  Of note, she had had problems with following up with outpatient cardiology appointments as well as medication compliance. Plan: Cards following and managing diuresis.  AoC hypoxic respiratory failure - multifactorial due to alveolar hypoventilation from obesity + chronic opioids, alveolar shunt from edema / volume overload. COPD per report (no PFT's in system) - on chronic 2-3L O2. PAH - PAP 42 on echo 04/07/2016. OSA - poor compliance with CPAP in past. Mild bilateral pleural effusions - suspect due to volume overload in setting severe right heart failure. Plan: Continue supplemental O2 as needed to maintain SpO2 > 92%. Continue aggressive diuresis as above, increased to 120mg  BID on 03/05 per cards recs. Continue BD's (DuoNebs, Albuterol). Defer V/Q for now given plans for palliative care. F/u on ANA, ANCA, RF, CCP, scl-70, HIV, LFT's.  Concern for opioid dependence - per pt's son, she has hx of "manipulative behavior" and "hoarding opioids". Plan: Caution with pain management.   Nothing further to add.  Continue with supportive care and follow autoimmune labs.  Fully agree with DNR  and palliative care (as of 04/21/16). PCCM will sign off.  Please do not hesitate to call us back if we can be of any further assistance.   Montey Hora, [place] [facility] Pager: [phone]  or [phone] 04/21/2016, 9:28 AM

## 2016-04-21 NOTE — Progress Notes (Signed)
Progress Note  Patient Name: Victoria Fisher Date of Encounter: 04/21/2016  Primary Cardiologist: Dr. Sallyanne Kuster  Subjective   82 y.o. female with medical history significant for chronic diastolic CHF, cor pulmonale, Pulm HTN, COPD, chronic resp failure on 3-4L O2, HTN, chronic lymphedema, CAD, chronic kidney disease 3, P A. fib, chronic pain syndrome, COPD, frequent falls, hypothyroid and OSA presented to ED from SNF due to worsening edema, extending from her legs to her abdomen, including breasts, despite being on lasix 80 and 40mg  daily   has been transferred to [facility]. I/O are 3.6  liters so far this admission  Did not diurese yesterday , actually gained 400 cc   Inpatient Medications    Scheduled Meds: . chlorhexidine  15 mL Mouth Rinse BID  . enoxaparin (LOVENOX) injection  40 mg Subcutaneous Q24H  . furosemide  120 mg Intravenous BID  . levothyroxine  125 mcg Oral QAC breakfast  . mouth rinse  15 mL Mouth Rinse q12n4p  . metolazone  5 mg Oral Daily  . metoprolol succinate  50 mg Oral Daily  . polyethylene glycol  17 g Oral Daily  . senna-docusate  2 tablet Oral BID  . sodium chloride flush  3 mL Intravenous Q12H   Continuous Infusions:  PRN Meds: acetaminophen, albuterol, ALPRAZolam, bisacodyl, ipratropium-albuterol, lactulose, morphine, ondansetron **OR** ondansetron (ZOFRAN) IV, traZODone   Vital Signs    Vitals:   04/21/16 0300 04/21/16 0400 04/21/16 0500 04/21/16 0810  BP: 107/70 109/72 96/73 107/68  Pulse: 73 80 76 75  Resp: 15 15 10 11   Temp: 97.2 F (36.2 C)   97.4 F (36.3 C)  TempSrc: Axillary   Oral  SpO2: 100% 97% 98% 97%  Weight: 206 lb 6.4 oz (93.6 kg)     Height:        Intake/Output Summary (Last 24 hours) at 04/21/16 0829 Last data filed at 04/21/16 0300  Gross per 24 hour  Intake              662 ml  Output                0 ml  Net              662 ml   Filed Weights   04/20/16 0602 04/20/16 1345 04/21/16 0300  Weight: 205 lb (93 kg) 206  lb 3.2 oz (93.5 kg) 206 lb 6.4 oz (93.6 kg)    Telemetry    AFib, rate controlled - Personally Reviewed  ECG    AFib, low voltage - Personally Reviewed from a few days ago  Physical Exam   GEN: unarousable , on BIPAP  , total body anasarca.  Has pitting edema of legs, abd., chest , shoulders, arms  Neck:  +++ JVD ; distended veins on forehead Cardiac: irregularly irregular, no murmurs, rubs, or gallops.  Respiratory:  on BIPAP  GI:  + pitting edema  MS:  Massive anasarca  Neuro:  nonresponsive Psych: Normal affect   Labs    Chemistry  Recent Labs Lab 03/29/2016 1625  04/18/16 0242 04/19/16 0355 04/20/16 1230  NA 139  < > 139 138 137  K 4.0  < > 4.8 5.0 3.9  CL 100*  < > 99* 100* 95*  CO2 28  < > 31 33* 34*  GLUCOSE 92  < > 93 86 105*  BUN 35*  < > 34* 35* 35*  CREATININE 1.13*  < > 1.23* 1.17* 1.20*  CALCIUM 9.9  < >  9.7 9.8 9.8  PROT  --   --   --   --  7.3  ALBUMIN 3.0*  --   --   --  3.3*  AST  --   --   --   --  22  ALT  --   --   --   --  6*  ALKPHOS  --   --   --   --  95  BILITOT  --   --   --   --  0.9  GFRNONAA 45*  < > 40* 43* 42*  GFRAA 52*  < > 47* 50* 48*  ANIONGAP 11  < > 9 5 8   < > = values in this interval not displayed.   Hematology  Recent Labs Lab 04/13/2016 1625 04/15/16 0320 04/18/16 0242  WBC 4.7 4.9 5.3  RBC 2.89* 2.87* 2.93*  HGB 9.3* 9.1* 9.3*  HCT 30.4* 30.1* 31.5*  MCV 105.2* 104.9* 107.5*  MCH 32.2 31.7 31.7  MCHC 30.6 30.2 29.5*  RDW 15.8* 15.8* 16.0*  PLT 114* 126* 115*    Cardiac Enzymes  Recent Labs Lab 04/06/2016 1625  TROPONINI <0.03     Recent Labs Lab 03/21/2016 1657  TROPIPOC 0.02     BNP  Recent Labs Lab 03/22/2016 1625  BNP 384.4*     DDimer No results for input(s): DDIMER in the last 168 hours.   Radiology    Dg Chest Port 1 View  Result Date: 04/20/2016 CLINICAL DATA:  Shortness of breath EXAM: PORTABLE CHEST 1 VIEW COMPARISON:  Multiple prior chest x-rays. FINDINGS: Stable cardiac  enlargement. Could not exclude a component of pericardial effusion. Tortuosity and calcification of the thoracic aorta is again noted. Low lung volumes with vascular crowding and bibasilar atelectasis. No obvious infiltrates or effusions. IMPRESSION: Stable cardiac enlargement. Low lung volumes with vascular crowding and basilar atelectasis. Electronically Signed   By: Marijo Sanes M.D.   On: 04/20/2016 12:44    Cardiac Studies   Echo: no pericardial effusion; dilated right heart  Patient Profile     82 y.o. female with severe right heart failure, edema  Assessment & Plan    1) Acute on chronic systolic right heart failure: Chronic cor pulmonale-  Continue Lasix. Renal function unchanged. She is still quite volume overloaded.      She is now more responsive,   Opened her eyes when I came in  I have personally reviewed the echo. She has severe Cor Pulmonale. The RV function is severely depressed  I think her chances of survival are nil  I have talked to Dr. Candiss Norse and we agree that a palliative care approach is probably best  I have entered the order   2. Atrial fibrillation: rate well controlled. Per prior notes unable to take anticoagulation.  CHADS2VASc 4  Signed, Mertie Moores, MD  04/21/2016, 8:29 AM

## 2016-04-21 NOTE — Progress Notes (Signed)
Long discussion w/Ms. Burnette and Dr. Candiss Norse re: code status and potential end of life care.  Patient understands palliative approach.  She requests to see her husband and for her son, Ovid Curd, to be contacted.  Dr. Candiss Norse was able to update Ovid Curd and he spoke w/the patient over the phone.  PICC line is to be placed for IV access, labs, medications.  Foley also to be placed for critical care, end of life care.  Will continue to provide medical treatment as ordered and comfort throughout the day.

## 2016-04-21 NOTE — Telephone Encounter (Signed)
Per Victoria Fisher pt is in hospice and they expect she will pass away any day now. cx appt 3/8.

## 2016-04-22 ENCOUNTER — Encounter: Payer: Self-pay | Admitting: Licensed Clinical Social Worker

## 2016-04-22 ENCOUNTER — Other Ambulatory Visit: Payer: Self-pay | Admitting: Licensed Clinical Social Worker

## 2016-04-22 DIAGNOSIS — I5033 Acute on chronic diastolic (congestive) heart failure: Secondary | ICD-10-CM

## 2016-04-22 LAB — ANA W/REFLEX IF POSITIVE: ANA: POSITIVE — AB

## 2016-04-22 MED ORDER — HYDROMORPHONE HCL 1 MG/ML IJ SOLN
0.5000 mg | Freq: Once | INTRAMUSCULAR | Status: AC
  Administered 2016-04-22: 0.5 mg via INTRAVENOUS
  Filled 2016-04-22: qty 1

## 2016-04-22 MED ORDER — FUROSEMIDE 40 MG PO TABS: 80.0000 mg | ORAL_TABLET | Freq: Three times a day (TID) | ORAL | Status: AC

## 2016-04-22 MED ORDER — MORPHINE SULFATE (CONCENTRATE) 10 MG/0.5ML PO SOLN: 10.0000 mg | ORAL | Status: DC | PRN

## 2016-04-22 MED ORDER — ACETAMINOPHEN 325 MG PO TABS: 650.0000 mg | ORAL_TABLET | Freq: Four times a day (QID) | ORAL | Status: DC | PRN

## 2016-04-22 MED ORDER — HALOPERIDOL LACTATE 5 MG/ML IJ SOLN: 0.5000 mg | INTRAMUSCULAR | Status: DC | PRN

## 2016-04-22 MED ORDER — MORPHINE SULFATE (CONCENTRATE) 10 MG/0.5ML PO SOLN: 10.0000 mg | ORAL | 0 refills | Status: AC | PRN

## 2016-04-22 MED ORDER — POLYVINYL ALCOHOL 1.4 % OP SOLN
1.0000 [drp] | Freq: Four times a day (QID) | OPHTHALMIC | Status: DC | PRN
  Filled 2016-04-22: qty 15

## 2016-04-22 MED ORDER — POTASSIUM CHLORIDE CRYS ER 20 MEQ PO TBCR: 20.0000 meq | EXTENDED_RELEASE_TABLET | Freq: Two times a day (BID) | ORAL | Status: AC

## 2016-04-22 MED ORDER — BIOTENE DRY MOUTH MT LIQD: 15.0000 mL | OROMUCOSAL | Status: DC | PRN

## 2016-04-22 MED ORDER — GLYCOPYRROLATE 1 MG PO TABS
1.0000 mg | ORAL_TABLET | ORAL | Status: DC | PRN
  Filled 2016-04-22: qty 1

## 2016-04-22 MED ORDER — ACETAMINOPHEN 650 MG RE SUPP: 650.0000 mg | Freq: Four times a day (QID) | RECTAL | Status: DC | PRN

## 2016-04-22 MED ORDER — MORPHINE SULFATE 15 MG PO TABS: 15.0000 mg | ORAL_TABLET | Freq: Three times a day (TID) | ORAL | Status: DC | PRN

## 2016-04-22 MED ORDER — LORAZEPAM 2 MG/ML PO CONC: 1.0000 mg | ORAL | Status: DC | PRN

## 2016-04-22 MED ORDER — LORAZEPAM 2 MG/ML PO CONC: 1.0000 mg | ORAL | 0 refills | Status: AC | PRN

## 2016-04-22 MED ORDER — HYDROMORPHONE HCL 1 MG/ML IJ SOLN
0.5000 mg | INTRAMUSCULAR | Status: DC | PRN
  Administered 2016-04-22: 0.5 mg via INTRAVENOUS
  Filled 2016-04-22: qty 1

## 2016-04-22 MED ORDER — HALOPERIDOL 0.5 MG PO TABS
0.5000 mg | ORAL_TABLET | ORAL | Status: DC | PRN
  Filled 2016-04-22: qty 1

## 2016-04-22 MED ORDER — HALOPERIDOL LACTATE 2 MG/ML PO CONC
0.5000 mg | ORAL | Status: DC | PRN
  Filled 2016-04-22: qty 0.3

## 2016-04-22 MED ORDER — GLYCOPYRROLATE 0.2 MG/ML IJ SOLN
0.2000 mg | INTRAMUSCULAR | Status: DC | PRN
  Filled 2016-04-22: qty 1

## 2016-04-22 MED ORDER — HYDROMORPHONE HCL 1 MG/ML IJ SOLN
1.0000 mg | INTRAMUSCULAR | Status: DC | PRN
  Administered 2016-04-22 (×2): 1 mg via INTRAVENOUS
  Filled 2016-04-22 (×3): qty 1

## 2016-04-22 MED ORDER — MORPHINE SULFATE (PF) 2 MG/ML IV SOLN
2.0000 mg | INTRAVENOUS | Status: DC | PRN
  Administered 2016-04-22: 2 mg via INTRAVENOUS
  Filled 2016-04-22: qty 1

## 2016-04-22 MED ORDER — GLYCOPYRROLATE 0.2 MG/ML IJ SOLN: 0.2000 mg | INTRAMUSCULAR | Status: DC | PRN

## 2016-04-23 ENCOUNTER — Ambulatory Visit: Payer: Self-pay | Admitting: Neurology

## 2016-05-17 NOTE — Progress Notes (Signed)
Occupational Therapy Discharge Patient Details Name: Victoria Fisher MRN: [id number] DOB: 09-Jun-1934 Today's Date: May 07, 2016 Time: 9:43 a.m.    Patient discharged from OT services secondary to medical decline - comfort care, plan is for hospice care  Please see latest therapy progress note for current level of functioning and progress toward goals.       GO     [name] [facility] 07-May-2016, 9:43 AM

## 2016-05-17 NOTE — Progress Notes (Signed)
    No new recs Patient is DNR and is on comfort cart  Will sign off.    Mertie Moores, MD  16-May-2016 8:23 AM    [address],  [address] [place], [place]  [address] Pager [phone] Phone: [phone]; Fax: [phone]

## 2016-05-17 NOTE — Progress Notes (Signed)
Patient c/o a burning sensation when she urinates. Asked patient if she wanted to take the foley out or try a medication to help it. Patient replied that she wants the foley left in and would like to try a medication. RN notified Tylene Fantasia provider on call and Pyridium was ordered. RN asked patient if she was going to be okay with not having foley in when she discharges. Patient states she was unaware that she couldn't go to the facility with a foley and would like to go back to the facility she came from so she can keep her foley. Will notify nurse in morning and leave note for case management to follow up with.

## 2016-05-17 NOTE — Plan of Care (Signed)
Problem: Health Behavior/Discharge Planning: Goal: Ability to manage health-related needs will improve Outcome: Not Progressing Pt terminal, discharging to Hospice

## 2016-05-17 NOTE — Patient Outreach (Signed)
[facility] [facility]) Care Management  2016/05/05  JO-ANN JOHANNING [birth year] [id number]   Assessment- Patient is still hospitalized for worsening respiratory failure with severe cor pulmonale. CSW completed chart review and became aware that patient is now in full comfort care and is expected to expire quickly. Patient is terminal and now active with hospice.  Plan-CSW will complete case closure at this time and will update [facility] PCP and Homeland Management Assistant.  [name], BSW, MSW, [email or web address] Phone: [phone] Fax: [phone]

## 2016-05-17 NOTE — Discharge Summary (Signed)
[facility] Death Note                                                                                                                                                                                               Victoria Fisher TKW:[id number] DOB: [birth year] DOA: May 03, 2016  PCP: Haywood Pao, MD  Admit date: 03-May-2016  Date of Death: 05/11/2016     Time of Death: 14:22   Pronounced By - RN  Notification: Haywood Pao, MD notified of death of 2016/05/11   History of present illness:   Victoria Fisher is a 82 y.o. female with a history of -  82 y.o.femalewith history of chronic diastolic CHF, cor pulmonale, Pulm HTN, COPD, chronic resp failure on 3-4L O2, HTN, chronic lymphedema, CAD, CKD Stage 3, Parox A. fib, chronic pain syndrome, COPD, frequent falls, hypothyroid and OSA who presented to ED from her SNF due to dyspnea and worsening edema from her legs to her abdomen despite being on lasix 80 and 40mg  daily, due dCHF, severe TR from Cor Pulmonale who was now made full comfort care.   Final Diagnoses:  Cause if death - CHF  Signature  Thurnell Lose M.D on 05-11-2016 at 2:53 PM  [facility]  Office Phone [phone]  Total clinical and documentation time for today Under 30 minutes   Last Note    [place] TEAM 1 - Stepdown/ICU TEAM  Victoria Fisher  QQI:[id number] DOB: 1934-05-14 DOA: 05-03-2016 PCP: Haywood Pao, MD    Brief Narrative:   82 y.o. female with history of chronic diastolic CHF, cor pulmonale, Pulm HTN, COPD, chronic resp failure on 3-4L O2, HTN, chronic lymphedema, CAD, CKD Stage 3, Parox A. fib, chronic pain syndrome, COPD, frequent falls, hypothyroid and OSA who presented to ED from her SNF due to dyspnea and worsening edema from her legs to her abdomen despite being on lasix 80 and 40mg  daily.  She felt she was possibly 40lbs above her usual  weight.  Subjective:  Patient in bed, appears to be more weak and somnolent this morning, minimally responsive.     Assessment & Plan:   Acute on chronic diastolic CHF with ANASARCA - Cor Pulmonale - Pulm HTN with Severe TR -   baseline weight ~75kg on 02/19/16  - EF 55-60% w/o WMA w/ severely dilated RV and RA and wide open TR regurgitation - cont to diurese and follow renal fxn, applied UNNA boots - making very slow progress at this time , she is incontinent hence I&Os are incorrect, monitor weight.   She is been seen both by cardiology  and pulmonary this admission, her main issue seems to be pulmonary hypertension with cor pulmonale and severe tricuspid regurgitation, at this age these are unfixable issues, had detailed discussions with pulmonary and cardiology, also discussed with her son and patient.   She is now on full comfort care. Doubt she will survive beyond today. If she does discharged with hospice. Plan discussed with son again on 04-30-16. Full comfort measures. Palliative care is involved.   COPD With cor pulmonale COPD currently compensated, supportive care  Chronic pain syndrome No evidence of uncontrolled pain at this time.  Lymphedema with chronic pain Unna boots changed per ortho tech  Anemia in chronic kidney disease Hgb is stable   Parox Afib CHADsvasc score>3 - not on anticoagulation due to falls - rate well controlled  CKD 3 Creatinine stable - follow with ongoing diuresis -    Recent Labs Lab 04/17/16 0236 04/18/16 0242 04/19/16 0355 04/20/16 1230 04/21/16 0829  CREATININE 1.17* 1.23* 1.17* 1.20* 1.26*     DVT prophylaxis: Lovenox Code Status: DNR - NO CODE Family Communication: son over the phone Disposition Plan: stable for transfer to CHF floor - cont PT/OT - will likely require SNF  Consultants:  Cardiology , Pulmonary, Pall care  Procedures:  PICC line requested on 04/20/2016   2/27 TTE - - Left ventricle: The cavity size  was normal. Wall thickness was normal. Systolic function was normal. The estimated ejection fraction was in the range of 55% to 60%. Wall motion was normal; there were no regional wall motion abnormalities. - Ventricular septum: The contour showed diastolic flattening. These changes are consistent with RV volume overload. - Mitral valve: There was mild to moderate regurgitation. - Left atrium: The atrium was moderately to severely dilated. - Right ventricle: The cavity size was severely dilated. Systolic function was mildly to moderately reduced. - Right atrium: The atrium was massively dilated. - Tricuspid valve: There was malcoaptation of the valve leaflets. There was wide-open regurgitation directed centrally. A diagnosis of severe regurgitation is supported by hepatic vein systolic flow reversal. - Pulmonary arteries: Systolic pressure was mildly increased. PA peak pressure: 42 mm Hg (S). - Pericardium, extracardiac: There was a left pleural effusion   Antimicrobials:  None  Objective: Blood pressure (!) 63/42, pulse 87, temperature 99.1 F (37.3 C), temperature source Oral, resp. rate (!) 22, height 5\' 4"  (1.626 m), weight 93.6 kg (206 lb 6.4 oz), SpO2 95 %.  Intake/Output Summary (Last 24 hours) at 04-30-2016 1453 Last data filed at 2016-04-30 0000  Gross per 24 hour  Intake             1080 ml  Output                0 ml  Net             1080 ml   Filed Weights   04/20/16 0602 04/20/16 1345 04/21/16 0300  Weight: 93 kg (205 lb) 93.5 kg (206 lb 3.2 oz) 93.6 kg (206 lb 6.4 oz)    Examination: General: No acute respiratory distress at rest in chair  Lungs: bibasilar crackles persist - no wheezing  Cardiovascular: distant HS - reg rate  Abdomen: Nontender, obese and distended, soft, bowel sounds positive Extremities: 2+ anasarca w/o signif change, +ve UNNA boots  CBC:  Recent Labs Lab 04/18/16 0242 04/21/16 0829  WBC 5.3 14.2*  HGB 9.3* 9.8*  HCT 31.5* 33.1*  MCV 107.5*  107.1*  PLT 115* 614*   Basic Metabolic  Panel:  Recent Labs Lab 04/17/16 0236 04/18/16 0242 04/19/16 0355 04/20/16 1230 04/21/16 0829  NA 136 139 138 137 135  K 4.5 4.8 5.0 3.9 4.3  CL 100* 99* 100* 95* 96*  CO2 27 31 33* 34* 29  GLUCOSE 85 93 86 105* 99  BUN 34* 34* 35* 35* 40*  CREATININE 1.17* 1.23* 1.17* 1.20* 1.26*  CALCIUM 9.5 9.7 9.8 9.8 9.6  MG  --   --   --  2.2 2.2   GFR: Estimated Creatinine Clearance: 39.5 mL/min (by C-G formula based on SCr of 1.26 mg/dL (H)).  Liver Function Tests:  Recent Labs Lab 04/20/16 1230  AST 22  ALT 6*  ALKPHOS 95  BILITOT 0.9  PROT 7.3  ALBUMIN 3.3*   Cardiac Enzymes: No results for input(s): CKTOTAL, CKMB, CKMBINDEX, TROPONINI in the last 168 hours.  HbA1C: Hgb A1c MFr Bld  Date/Time Value Ref Range Status  03/12/2008 04:00 AM  4.6 - 6.1 % Final   6.1 (NOTE)   The ADA recommends the following therapeutic goal for glycemic   control related to Hgb A1C measurement:   Goal of Therapy:   < 7.0% Hgb A1C   Reference: American Diabetes Association: Clinical Practice   Recommendations 2008, Diabetes Care,  2008, 31:(Suppl 1).    Recent Results (from the past 240 hour(s))  MRSA PCR Screening     Status: None   Collection Time: 04/04/2016  8:53 PM  Result Value Ref Range Status   MRSA by PCR NEGATIVE NEGATIVE Final    Comment:        The GeneXpert MRSA Assay (FDA approved for NASAL specimens only), is one component of a comprehensive MRSA colonization surveillance program. It is not intended to diagnose MRSA infection nor to guide or monitor treatment for MRSA infections.      Scheduled Meds: . chlorhexidine  15 mL Mouth Rinse BID  . furosemide  120 mg Intravenous BID  . levothyroxine  125 mcg Oral QAC breakfast  . mouth rinse  15 mL Mouth Rinse q12n4p  . polyethylene glycol  17 g Oral Daily  . sodium chloride flush  10-40 mL Intracatheter Q12H     LOS: 8 days   Signature  Lala Lund K M.D on May 05, 2016 at  2:53 PM  Between 7am to 7pm - Pager - [phone], After 7pm go to [email or web address] - password [email or web address]  [facility]  - Office  [phone]

## 2016-05-17 NOTE — Progress Notes (Signed)
Physical Therapy Discharge Patient Details Name: Victoria Fisher MRN: [id number] DOB: 12-03-1934 Today's Date: 05/19/16 Time:  -     Patient discharged from PT services secondary to pt now comfort care.   Martha 05/19/2016, 9:43 AM  Suanne Marker PT [phone]

## 2016-05-17 NOTE — Care Management Note (Addendum)
Case Management Note  Patient Details  Name: Victoria Fisher MRN: [id number] Date of Birth: 1934-09-23  Subjective/Objective:                    Action/Plan:    Pt is from [facility] and was originally thought to be able to transfer to SNF with husband - however pt is no longer stable for transfer out of facility and is now full comfort care.  Pt is expected to expire quickly and may not be  appropriate for GIP - CM text paged attending requesting GIP consideration and transfer to floor.  CM will continue to follow for discharge needs  Expected Discharge Date:                  Expected Discharge Plan:  Expired  In-House Referral:     Discharge planning Services  CM Consult  Post Acute Care Choice:    Choice offered to:     DME Arranged:    DME Agency:     HH Arranged:    HH Agency:     Status of Service:  In process, will continue to follow  If discussed at Long Length of Stay Meetings, dates discussed:    Additional Comments:  Maryclare Labrador, RN Apr 24, 2016, 9:18 AM

## 2016-05-17 NOTE — Progress Notes (Signed)
OT Cancellation Note  Patient Details Name: Victoria Fisher MRN: [id number] DOB: Jul 03, 1934   Cancelled Treatment:    Reason Eval/Treat Not Completed: Medical issues which prohibited therapy;Other (comment). Pt on comfort care and will d/c to hospice, OT will sign off  Britt Bottom 12-May-2016, 9:12 AM

## 2016-05-17 NOTE — Progress Notes (Signed)
Patient c/o pain in her mid back area. Has already had Xanax and Morphine IR 15 mg about 1 hour ago. Patient states no relief. Notified provider on call K. Baltazar Najjar who ordered a one time dose of Diluadid 0.5mg  IV.  0101: Dilaudid administered and effective. Will continue to monitor.

## 2016-05-17 NOTE — Progress Notes (Signed)
[place] TEAM 1 - Stepdown/ICU TEAM  ASAMI LAMBRIGHT  LTJ:[id number] DOB: 1934/03/07 DOA: 04/11/2016 PCP: Haywood Pao, MD    Brief Narrative:   82 y.o. female with history of chronic diastolic CHF, cor pulmonale, Pulm HTN, COPD, chronic resp failure on 3-4L O2, HTN, chronic lymphedema, CAD, CKD Stage 3, Parox A. fib, chronic pain syndrome, COPD, frequent falls, hypothyroid and OSA who presented to ED from her SNF due to dyspnea and worsening edema from her legs to her abdomen despite being on lasix 80 and 40mg  daily.  She felt she was possibly 40lbs above her usual weight.  Subjective:  Patient in bed, appears to be more weak and somnolent this morning, minimally responsive.     Assessment & Plan:   Acute on chronic diastolic CHF with ANASARCA - Cor Pulmonale - Pulm HTN with Severe TR -   baseline weight ~75kg on 02/19/16  - EF 55-60% w/o WMA w/ severely dilated RV and RA and wide open TR regurgitation - cont to diurese and follow renal fxn, applied UNNA boots - making very slow progress at this time , she is incontinent hence I&Os are incorrect, monitor weight.   She is been seen both by cardiology and pulmonary this admission, her main issue seems to be pulmonary hypertension with cor pulmonale and severe tricuspid regurgitation, at this age these are unfixable issues, had detailed discussions with pulmonary and cardiology, also discussed with her son and patient.   She is now on full comfort care. Doubt she will survive beyond today. If she does discharged with hospice. Plan discussed with son again on May 02, 2016. Full comfort measures. Palliative care is involved.   COPD With cor pulmonale COPD currently compensated, supportive care  Chronic pain syndrome No evidence of uncontrolled pain at this time.  Lymphedema with chronic pain Unna boots changed per ortho tech  Anemia in chronic kidney disease Hgb is stable   Parox Afib CHADsvasc score>3 - not on  anticoagulation due to falls - rate well controlled  CKD 3 Creatinine stable - follow with ongoing diuresis -    Recent Labs Lab 04/17/16 0236 04/18/16 0242 04/19/16 0355 04/20/16 1230 04/21/16 0829  CREATININE 1.17* 1.23* 1.17* 1.20* 1.26*     DVT prophylaxis: Lovenox Code Status: DNR - NO CODE Family Communication: son over the phone Disposition Plan: stable for transfer to CHF floor - cont PT/OT - will likely require SNF  Consultants:  Cardiology , Pulmonary, Pall care  Procedures:  PICC line requested on 04/20/2016   2/27 TTE - - Left ventricle: The cavity size was normal. Wall thickness was normal. Systolic function was normal. The estimated ejection fraction was in the range of 55% to 60%. Wall motion was normal; there were no regional wall motion abnormalities. - Ventricular septum: The contour showed diastolic flattening. These changes are consistent with RV volume overload. - Mitral valve: There was mild to moderate regurgitation. - Left atrium: The atrium was moderately to severely dilated. - Right ventricle: The cavity size was severely dilated. Systolic function was mildly to moderately reduced. - Right atrium: The atrium was massively dilated. - Tricuspid valve: There was malcoaptation of the valve leaflets. There was wide-open regurgitation directed centrally. A diagnosis of severe regurgitation is supported by hepatic vein systolic flow reversal. - Pulmonary arteries: Systolic pressure was mildly increased. PA peak pressure: 42 mm Hg (S). - Pericardium, extracardiac: There was a left pleural effusion   Antimicrobials:  None  Objective: Blood pressure (!) 81/51, pulse  87, temperature 99.1 F (37.3 C), temperature source Oral, resp. rate (!) 22, height 5\' 4"  (1.626 m), weight 93.6 kg (206 lb 6.4 oz), SpO2 95 %.  Intake/Output Summary (Last 24 hours) at 05-10-16 1056 Last data filed at May 10, 2016 0000  Gross per 24 hour  Intake             1080 ml  Output                 0 ml  Net             1080 ml   Filed Weights   04/20/16 0602 04/20/16 1345 04/21/16 0300  Weight: 93 kg (205 lb) 93.5 kg (206 lb 3.2 oz) 93.6 kg (206 lb 6.4 oz)    Examination: General: No acute respiratory distress at rest in chair  Lungs: bibasilar crackles persist - no wheezing  Cardiovascular: distant HS - reg rate  Abdomen: Nontender, obese and distended, soft, bowel sounds positive Extremities: 2+ anasarca w/o signif change, +ve UNNA boots  CBC:  Recent Labs Lab 04/18/16 0242 04/21/16 0829  WBC 5.3 14.2*  HGB 9.3* 9.8*  HCT 31.5* 33.1*  MCV 107.5* 107.1*  PLT 115* 220*   Basic Metabolic Panel:  Recent Labs Lab 04/17/16 0236 04/18/16 0242 04/19/16 0355 04/20/16 1230 04/21/16 0829  NA 136 139 138 137 135  K 4.5 4.8 5.0 3.9 4.3  CL 100* 99* 100* 95* 96*  CO2 27 31 33* 34* 29  GLUCOSE 85 93 86 105* 99  BUN 34* 34* 35* 35* 40*  CREATININE 1.17* 1.23* 1.17* 1.20* 1.26*  CALCIUM 9.5 9.7 9.8 9.8 9.6  MG  --   --   --  2.2 2.2   GFR: Estimated Creatinine Clearance: 39.5 mL/min (by C-G formula based on SCr of 1.26 mg/dL (H)).  Liver Function Tests:  Recent Labs Lab 04/20/16 1230  AST 22  ALT 6*  ALKPHOS 95  BILITOT 0.9  PROT 7.3  ALBUMIN 3.3*   Cardiac Enzymes: No results for input(s): CKTOTAL, CKMB, CKMBINDEX, TROPONINI in the last 168 hours.  HbA1C: Hgb A1c MFr Bld  Date/Time Value Ref Range Status  03/12/2008 04:00 AM  4.6 - 6.1 % Final   6.1 (NOTE)   The ADA recommends the following therapeutic goal for glycemic   control related to Hgb A1C measurement:   Goal of Therapy:   < 7.0% Hgb A1C   Reference: American Diabetes Association: Clinical Practice   Recommendations 2008, Diabetes Care,  2008, 31:(Suppl 1).    Recent Results (from the past 240 hour(s))  MRSA PCR Screening     Status: None   Collection Time: 03/25/2016  8:53 PM  Result Value Ref Range Status   MRSA by PCR NEGATIVE NEGATIVE Final    Comment:        The  GeneXpert MRSA Assay (FDA approved for NASAL specimens only), is one component of a comprehensive MRSA colonization surveillance program. It is not intended to diagnose MRSA infection nor to guide or monitor treatment for MRSA infections.      Scheduled Meds: . chlorhexidine  15 mL Mouth Rinse BID  . furosemide  120 mg Intravenous BID  . levothyroxine  125 mcg Oral QAC breakfast  . mouth rinse  15 mL Mouth Rinse q12n4p  . polyethylene glycol  17 g Oral Daily  . sodium chloride flush  10-40 mL Intracatheter Q12H     LOS: 8 days   Signature  Thurnell Lose M.D on May 10, 2016  at 10:56 AM  Between 7am to 7pm - Pager - [phone], After 7pm go to [email or web address] - password [facility]  [facility]  - Office  [phone]

## 2016-05-17 DEATH — deceased

## 2016-10-07 IMAGING — CT CT CERVICAL SPINE W/O CM
2 of 7 series · 6 of 33 positions shown, 7 images · non-contrast
Comparison: 11/18/2014

CLINICAL DATA: Multiple falls, neck brace from injury 2 years ago

EXAM:
CT HEAD WITHOUT CONTRAST
CT CERVICAL SPINE WITHOUT CONTRAST
TECHNIQUE: Multidetector CT imaging of the head and cervical spine was
performed following the standard protocol without intravenous
contrast. Multiplanar CT image reconstructions of the cervical spine
were also generated.

[Series 304: orthgonal · axial · 0.35mm/px · z∈[+647,+710]mm · 3 of 73 slices shown, 4 images]
[im 19/73  soft-tissue]
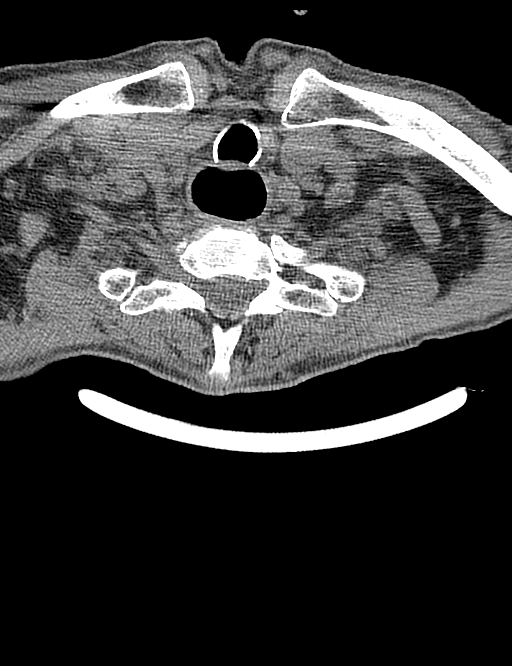
[im 19/73  bone]
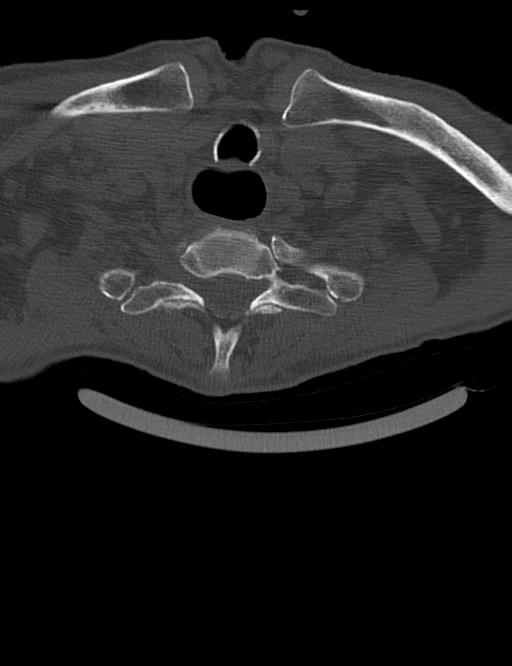
[im 37/73  bone]
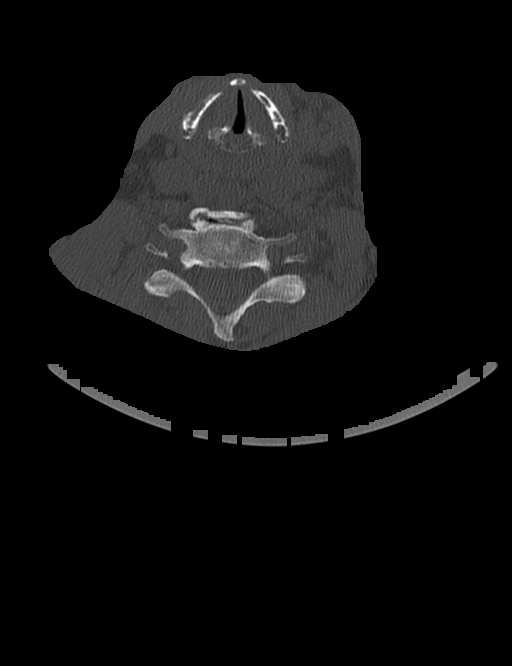
[im 55/73  bone]
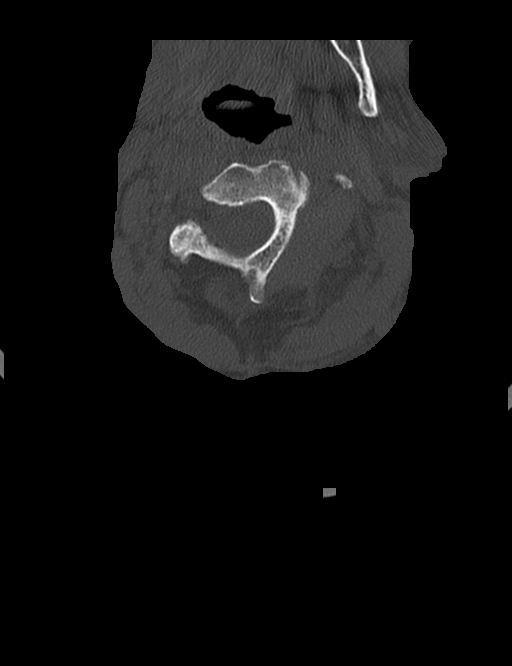

[Series 306: sagittal · sagittal · 0.37mm/px · 3 of 50 slices shown]
[im 13/50  bone]
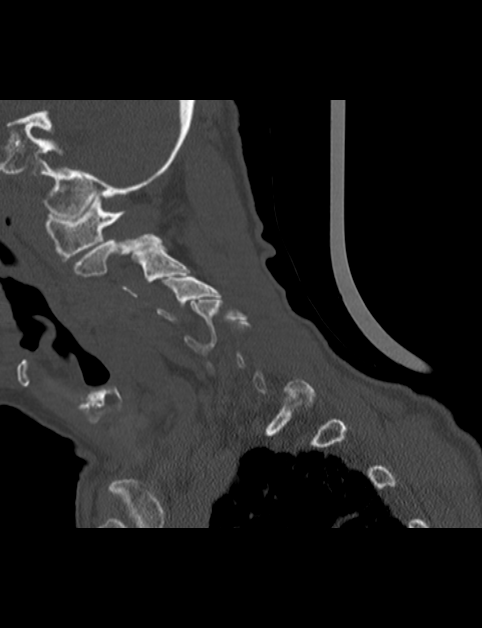
[im 25/50  bone]
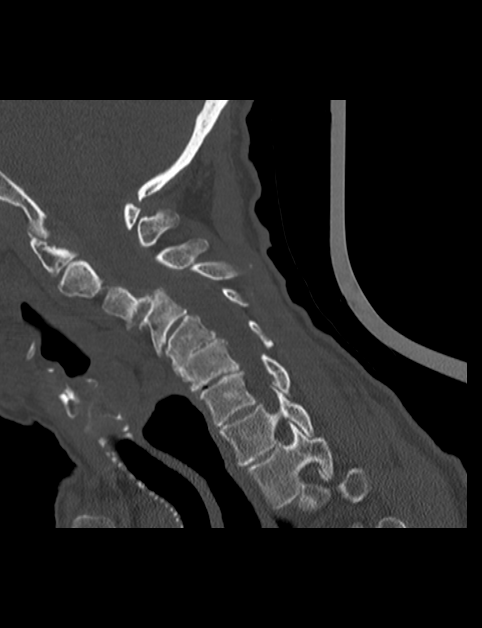
[im 37/50  bone]
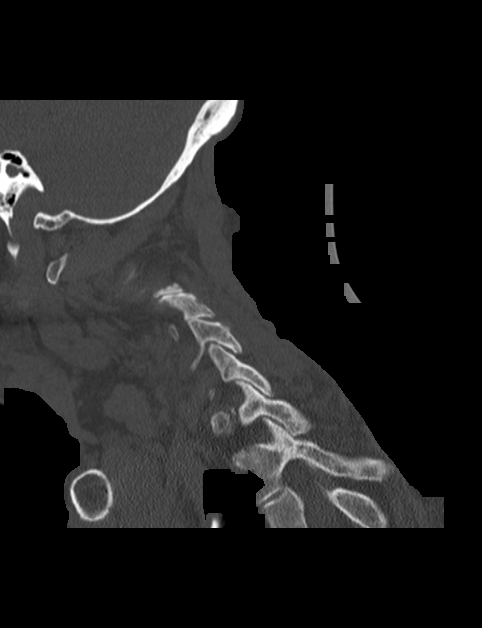

[6 of 33 positions shown; findings below may reference images not displayed]

FINDINGS: CT HEAD FINDINGS

No evidence of parenchymal hemorrhage or extra-axial fluid
collection. No mass lesion, mass effect, or midline shift.

No CT evidence of acute infarction.

Mild subcortical white matter and periventricular small vessel
ischemic changes.

Global cortical atrophy.  No ventriculomegaly.

The visualized paranasal sinuses are essentially clear. The mastoid
air cells are unopacified.

No evidence of calvarial fracture.

CT CERVICAL SPINE FINDINGS

Reversal of the normal mid cervical lordosis.

5 mm anterolisthesis of C3 on C4, unchanged.

No evidence of fracture or dislocation. Vertebral body heights are
maintained. Dens appears intact.

No prevertebral soft tissue swelling.

Mild to moderate multilevel degenerative changes, most prominent at
C5-6.

Visualized thyroid is unremarkable.

Mildly prominent cervical lymph nodes, including an 11 mm short axis
left supraclavicular node (series 302/ image 58), previously 10 mm
in 9594, chronic and likely reactive.

Visualized lung apices are notable for mild biapical
pleural-parenchymal scarring.
IMPRESSION: No evidence of acute intracranial abnormality. Atrophy with mild
small vessel ischemic changes.

No evidence of traumatic injury to the cervical spine. Mild to
moderate degenerative changes. 5 mm anterolisthesis of C3 on C4,
chronic.

## 2017-03-12 NOTE — Telephone Encounter (Signed)
Entered in error

## 2017-05-20 IMAGING — CR DG CHEST 1V PORT
1 series · 1 of 1 positions shown · non-contrast
Comparison: Chest radiograph 02/25/2016

CLINICAL DATA: Shortness of breath and respiratory distress

EXAM:
PORTABLE CHEST 1 VIEW

[AP]
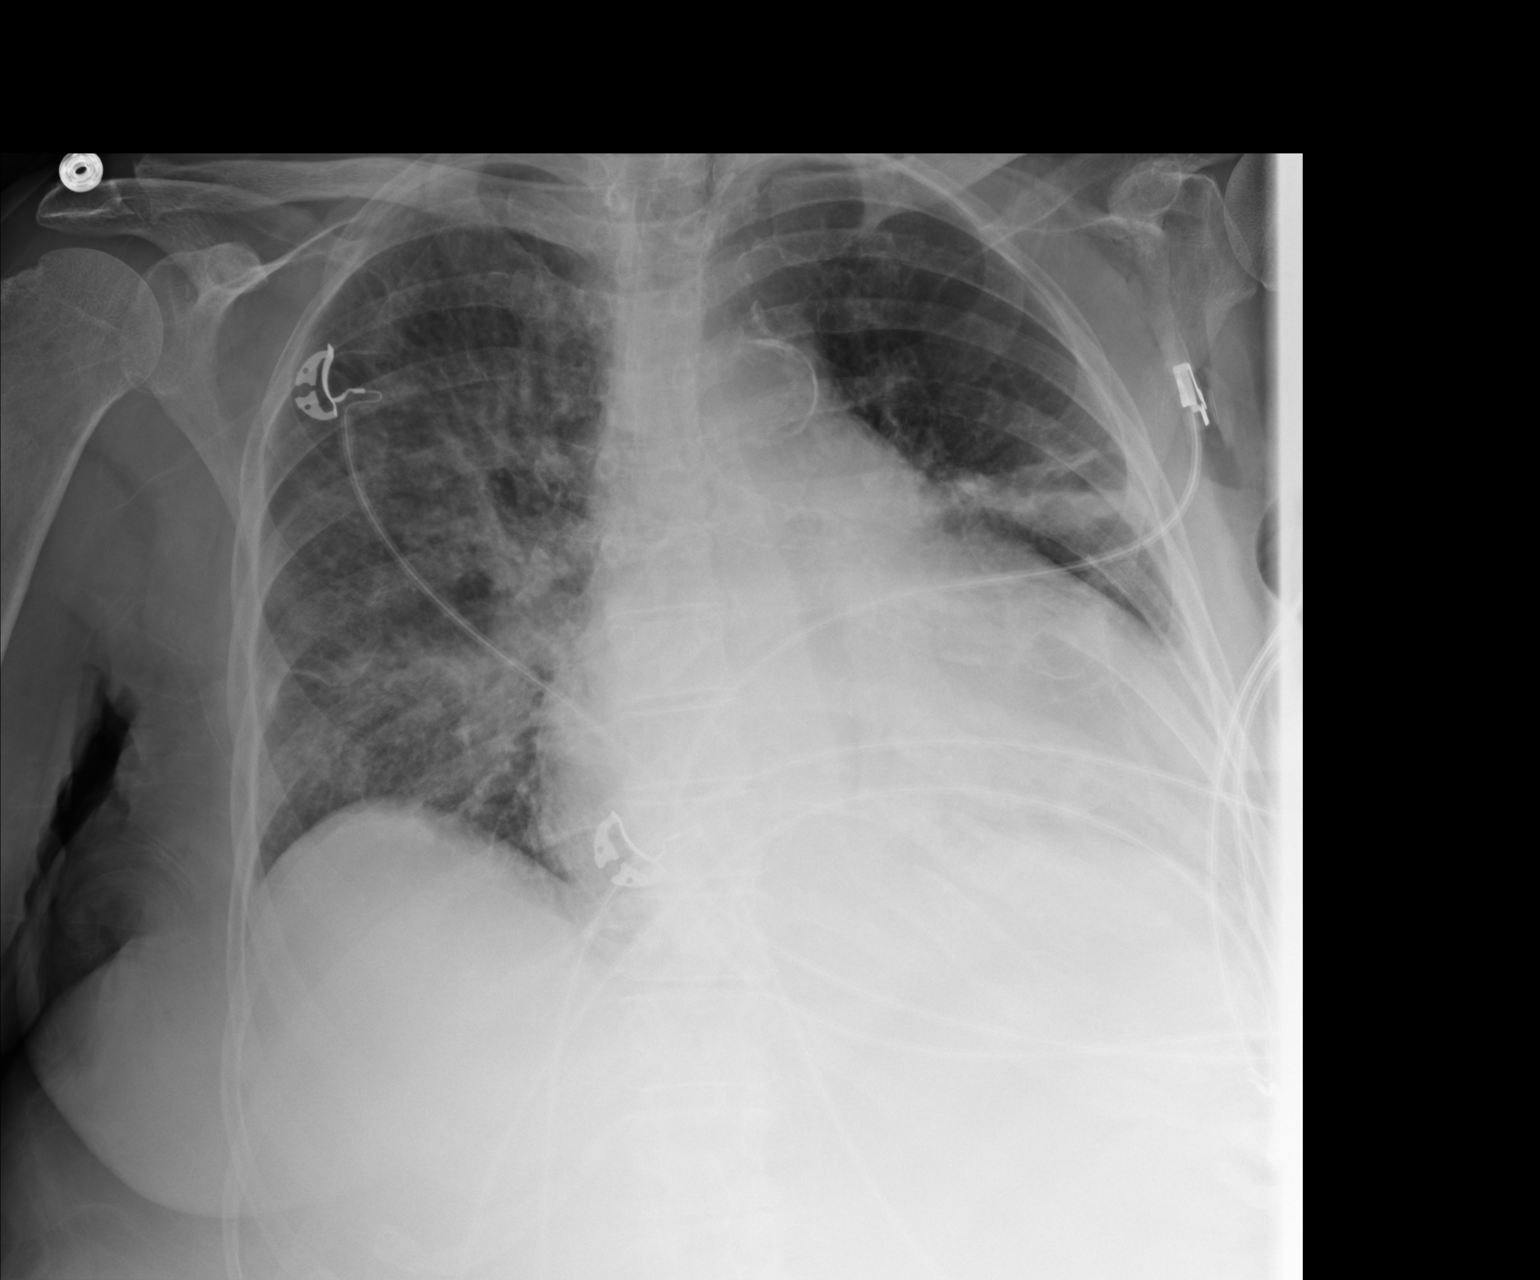

[1 of 1 positions shown; findings below may reference images not displayed]

FINDINGS: Cardiomegaly can't aortic atherosclerosis are unchanged.
Interstitial opacities in the right lung have worsened. Suspected
lingular atelectasis has increased. No pneumothorax or sizable
pleural effusion.
IMPRESSION: Massive cardiomegaly, aortic atherosclerosis and slightly worsening
pulmonary edema.

## 2017-05-21 IMAGING — CR DG CHEST 1V PORT
1 series · 1 of 1 positions shown · non-contrast
Comparison: 02/26/2016 and earlier.

CLINICAL DATA: 81-year-old female with shortness of Breath. COPD,
cor pulmonale. Initial encounter.

EXAM:
PORTABLE CHEST 1 VIEW

[AP]
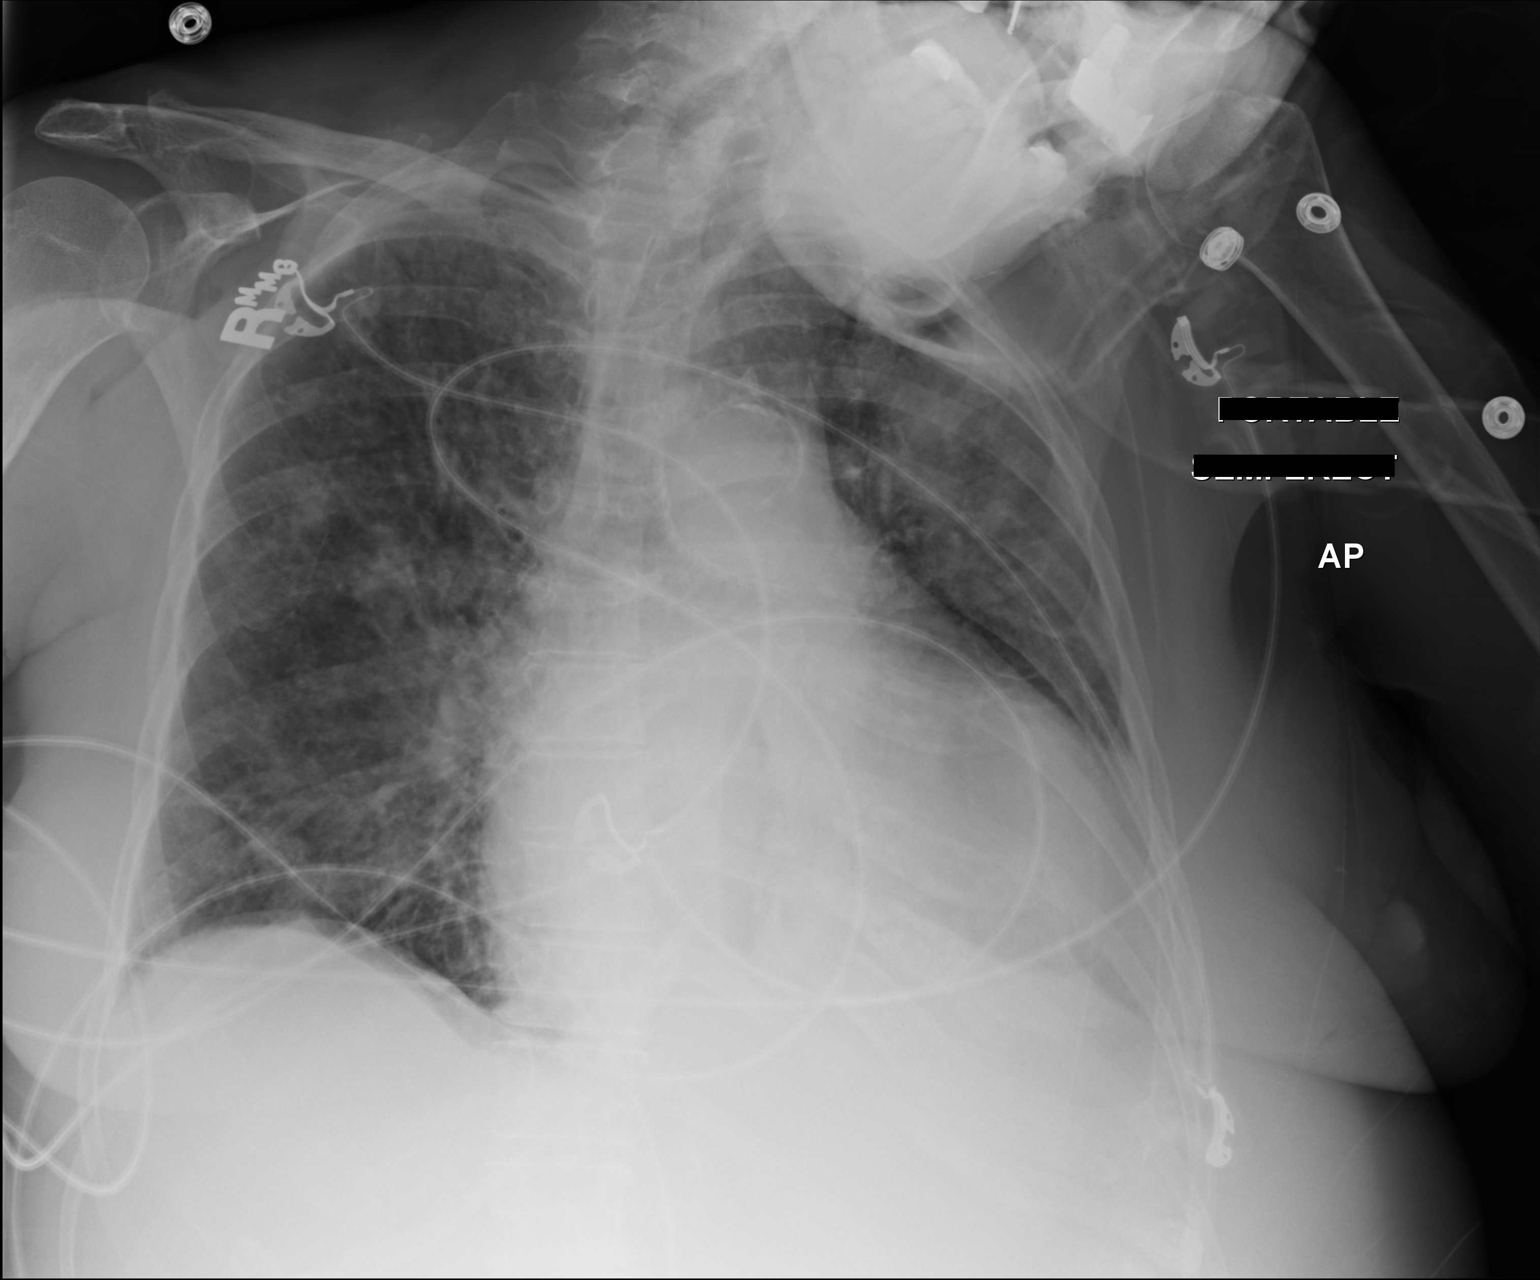

[1 of 1 positions shown; findings below may reference images not displayed]

FINDINGS: Portable AP semi upright view at 5007 hours. The patient remains
rotated to the left. Stable cardiomegaly and mediastinal contours.
Calcified aortic atherosclerosis. Interval decreased pulmonary
vascularity. No acute edema. Regressed platelike perihilar opacity.
No pneumothorax. No definite pleural effusion. No areas of worsening
ventilation.
IMPRESSION: 1. Interval improved ventilation with regressed pulmonary vascular
congestion and perihilar atelectasis.
2. Stable cardiomegaly.  Calcified aortic atherosclerosis.
